# Patient Record
Sex: Male | Born: 1940 | Race: White | Hispanic: No | Smoking: Former smoker
Health system: Southern US, Community
[De-identification: ages and names within clinical notes are randomized; demographics above are authoritative.]

## PROBLEM LIST (undated history)

## (undated) DIAGNOSIS — G629 Polyneuropathy, unspecified: Secondary | ICD-10-CM

## (undated) DIAGNOSIS — I272 Pulmonary hypertension, unspecified: Secondary | ICD-10-CM

## (undated) DIAGNOSIS — G9340 Encephalopathy, unspecified: Secondary | ICD-10-CM

## (undated) DIAGNOSIS — D638 Anemia in other chronic diseases classified elsewhere: Secondary | ICD-10-CM

## (undated) DIAGNOSIS — I739 Peripheral vascular disease, unspecified: Secondary | ICD-10-CM

## (undated) DIAGNOSIS — E1159 Type 2 diabetes mellitus with other circulatory complications: Secondary | ICD-10-CM

## (undated) DIAGNOSIS — I10 Essential (primary) hypertension: Secondary | ICD-10-CM

## (undated) DIAGNOSIS — S065XAA Traumatic subdural hemorrhage with loss of consciousness status unknown, initial encounter: Secondary | ICD-10-CM

## (undated) DIAGNOSIS — I5022 Chronic systolic (congestive) heart failure: Secondary | ICD-10-CM

## (undated) DIAGNOSIS — I2510 Atherosclerotic heart disease of native coronary artery without angina pectoris: Secondary | ICD-10-CM

## (undated) DIAGNOSIS — Z87891 Personal history of nicotine dependence: Secondary | ICD-10-CM

## (undated) DIAGNOSIS — L089 Local infection of the skin and subcutaneous tissue, unspecified: Secondary | ICD-10-CM

## (undated) DIAGNOSIS — N1831 Chronic kidney disease, stage 3a: Secondary | ICD-10-CM

## (undated) DIAGNOSIS — E11628 Type 2 diabetes mellitus with other skin complications: Secondary | ICD-10-CM

## (undated) DIAGNOSIS — E119 Type 2 diabetes mellitus without complications: Secondary | ICD-10-CM

## (undated) HISTORY — DX: Pulmonary hypertension, unspecified: I27.20

## (undated) HISTORY — DX: Traumatic subdural hemorrhage with loss of consciousness status unknown, initial encounter: S06.5XAA

## (undated) HISTORY — DX: Chronic systolic (congestive) heart failure: I50.22

## (undated) HISTORY — PX: CORONARY ANGIOPLASTY WITH STENT PLACEMENT: SHX49

---

## 1997-11-27 ENCOUNTER — Emergency Department (HOSPITAL_COMMUNITY): Admission: EM | Admit: 1997-11-27 | Discharge: 1997-11-27 | Payer: Self-pay | Admitting: *Deleted

## 2009-02-27 ENCOUNTER — Emergency Department (HOSPITAL_COMMUNITY): Admission: EM | Admit: 2009-02-27 | Discharge: 2009-02-27 | Payer: Self-pay | Admitting: Emergency Medicine
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
## 2012-06-13 ENCOUNTER — Encounter (HOSPITAL_COMMUNITY): Payer: Self-pay | Admitting: Emergency Medicine

## 2012-06-13 ENCOUNTER — Emergency Department (HOSPITAL_COMMUNITY)
Admission: EM | Admit: 2012-06-13 | Discharge: 2012-06-13 | Disposition: A | Discharge status: Home / Self Care | Payer: PRIVATE HEALTH INSURANCE | Attending: Emergency Medicine | Admitting: Emergency Medicine

## 2012-06-13 DIAGNOSIS — I10 Essential (primary) hypertension: Secondary | ICD-10-CM | POA: Insufficient documentation

## 2012-06-13 DIAGNOSIS — R7309 Other abnormal glucose: Secondary | ICD-10-CM | POA: Insufficient documentation

## 2012-06-13 DIAGNOSIS — Z79899 Other long term (current) drug therapy: Secondary | ICD-10-CM | POA: Insufficient documentation

## 2012-06-13 DIAGNOSIS — Z9861 Coronary angioplasty status: Secondary | ICD-10-CM | POA: Insufficient documentation

## 2012-06-13 DIAGNOSIS — M545 Low back pain, unspecified: Secondary | ICD-10-CM | POA: Insufficient documentation

## 2012-06-13 DIAGNOSIS — I2510 Atherosclerotic heart disease of native coronary artery without angina pectoris: Secondary | ICD-10-CM | POA: Insufficient documentation

## 2012-06-13 HISTORY — DX: Atherosclerotic heart disease of native coronary artery without angina pectoris: I25.10

## 2012-06-13 LAB — URINALYSIS, ROUTINE W REFLEX MICROSCOPIC
Hgb urine dipstick: NEGATIVE
Leukocytes, UA: NEGATIVE
Nitrite: NEGATIVE
Specific Gravity, Urine: <1.005 — ABNORMAL LOW (ref 1.005–1.030)
Urobilinogen, UA: 0.2 mg/dL (ref 0.0–1.0)

## 2012-06-13 LAB — BASIC METABOLIC PANEL
GFR calc Af Amer: >90 mL/min (ref >90)
GFR calc non Af Amer: 83 mL/min — ABNORMAL LOW (ref >90)
Glucose, Bld: 293 mg/dL — ABNORMAL HIGH (ref 70–99)
Potassium: 4.7 mEq/L (ref 3.5–5.1)
Sodium: 129 mEq/L — ABNORMAL LOW (ref 135–145)

## 2012-06-13 LAB — URINE MICROSCOPIC-ADD ON

## 2012-06-13 MED ORDER — NAPROXEN 500 MG PO TABS: 500.0000 mg | ORAL_TABLET | Freq: Two times a day (BID) | ORAL | Status: DC

## 2012-06-13 MED ORDER — CYCLOBENZAPRINE HCL 10 MG PO TABS
10.0000 mg | ORAL_TABLET | Freq: Once | ORAL | Status: AC
  Administered 2012-06-13: 10 mg via ORAL
  Filled 2012-06-13: qty 1

## 2012-06-13 MED ORDER — CYCLOBENZAPRINE HCL 10 MG PO TABS: 10.0000 mg | ORAL_TABLET | Freq: Two times a day (BID) | ORAL | Status: DC | PRN

## 2012-06-13 MED ORDER — KETOROLAC TROMETHAMINE 30 MG/ML IJ SOLN
30.0000 mg | Freq: Once | INTRAMUSCULAR | Status: AC
  Administered 2012-06-13: 30 mg via INTRAVENOUS
  Filled 2012-06-13: qty 1

## 2012-06-13 MED ORDER — GLUCOSE BLOOD VI STRP: ORAL_STRIP | Status: DC

## 2012-06-13 MED ORDER — METFORMIN HCL 500 MG PO TABS: 500.0000 mg | ORAL_TABLET | Freq: Two times a day (BID) | ORAL | Status: DC

## 2012-06-13 MED ORDER — LISINOPRIL 10 MG PO TABS: 10.0000 mg | ORAL_TABLET | Freq: Every day | ORAL | Status: DC

## 2012-06-13 MED ORDER — LISINOPRIL 10 MG PO TABS
10.0000 mg | ORAL_TABLET | ORAL | Status: AC
  Administered 2012-06-13: 10 mg via ORAL
  Filled 2012-06-13: qty 1

## 2012-06-13 NOTE — ED Notes (Signed)
IV removed by Nurse.

## 2012-06-13 NOTE — ED Notes (Signed)
Patient presents to ER via [facility] with c/o right sided lower back pain x 2 weeks.

## 2012-06-13 NOTE — ED Provider Notes (Signed)
History     CSN: [id number]  Arrival date & time 06/13/12  0159   First MD Initiated Contact with Patient 06/13/12 [phone]      Chief Complaint  Patient presents with  . Back Pain    (Consider location/radiation/quality/duration/timing/severity/associated sxs/prior treatment) HPI Comments: 72 year old male with a history of coronary disease status post stenting several years ago who presents with a complaint of right lower back pain which has been present for approximately 2 weeks. He states that it started one day while he was using his tractor to clear [place] when he looked over her shoulder and bent to the right. He had acute onset of sharp pain, worse with movement, improved when he rests perfectly still. Over the last 24 hours this pain has become unbearable, not associated with numbness weakness difficulty urinating fevers chills history of cancer or midline back pain. He denies difficulty urinating, hematuria, nausea or vomiting. He called paramedics for transport this evening because of severe pain.  This pain is made worse by any movements from a laying to a sitting position, it is made better when he calls perfectly still  Patient is a 72 y.o. male presenting with back pain. The history is provided by the patient.  Back Pain   Past Medical History  Diagnosis Date  . Coronary artery disease     Past Surgical History  Procedure Laterality Date  . Coronary angioplasty with stent placement      No family history on file.  History  Substance Use Topics  . Smoking status: Never Smoker   . Smokeless tobacco: Not on file  . Alcohol Use: No      Review of Systems  Musculoskeletal: Positive for back pain.  All other systems reviewed and are negative.    Allergies  Review of patient's allergies indicates no known allergies.  Home Medications   Current Outpatient Rx  Name  Route  Sig  Dispense  Refill  . cyclobenzaprine (FLEXERIL) 10 MG tablet   Oral   Take 1  tablet (10 mg total) by mouth 2 (two) times daily as needed for muscle spasms.   20 tablet   0   . glucose blood test strip      Use as instructed   100 each   12   . lisinopril (PRINIVIL,ZESTRIL) 10 MG tablet   Oral   Take 1 tablet (10 mg total) by mouth daily.   30 tablet   1   . metFORMIN (GLUCOPHAGE) 500 MG tablet   Oral   Take 1 tablet (500 mg total) by mouth 2 (two) times daily with a meal.   60 tablet   1   . naproxen (NAPROSYN) 500 MG tablet   Oral   Take 1 tablet (500 mg total) by mouth 2 (two) times daily with a meal.   30 tablet   0     BP 190/87  Pulse 38  Temp(Src) 98.3 F (36.8 C) (Oral)  Resp 17  SpO2 86%  Physical Exam  Nursing note and vitals reviewed. Constitutional: He appears well-developed and well-nourished. No distress.  HENT:  Head: Normocephalic and atraumatic.  Mouth/Throat: Oropharynx is clear and moist. No oropharyngeal exudate.  Eyes: Conjunctivae and EOM are normal. Pupils are equal, round, and reactive to light. Right eye exhibits no discharge. Left eye exhibits no discharge. No scleral icterus.  Neck: Normal range of motion. Neck supple. No JVD present. No thyromegaly present.  Cardiovascular: Normal rate, regular rhythm, normal heart sounds  and intact distal pulses.  Exam reveals no gallop and no friction rub.   No murmur heard. Significant hypertension at 215/95  Pulmonary/Chest: Effort normal and breath sounds normal. No respiratory distress. He has no wheezes. He has no rales.  Abdominal: Soft. Bowel sounds are normal. He exhibits no distension and no mass. There is no tenderness.  No pulsating mass, no hepatosplenomegaly  Musculoskeletal: Normal range of motion. He exhibits tenderness ( Tenderness to palpation in the right lower back muscles, no spinal tenderness). He exhibits no edema.  Lymphadenopathy:    He has no cervical adenopathy.  Neurological: He is alert. Coordination normal.  Normal sensation to light touch and  pinprick of the bilateral lower extremities, normal strength to the bilateral lower extremities at all major muscle groups. He has significant tenderness in his right lower back when he lifts his right leg.  Skin: Skin is warm and dry. No rash noted. No erythema.  Psychiatric: He has a normal mood and affect. His behavior is normal.    ED Course  Procedures (including critical care time)  Labs Reviewed  BASIC METABOLIC PANEL - Abnormal; Notable for the following:    Sodium 129 (*)    Chloride 92 (*)    Glucose, Bld 293 (*)    GFR calc non Af Amer 83 (*)    All other components within normal limits  URINALYSIS, ROUTINE W REFLEX MICROSCOPIC - Abnormal; Notable for the following:    Color, Urine STRAW (*)    Specific Gravity, Urine <1.005 (*)    Glucose, UA >1000 (*)    All other components within normal limits  GLUCOSE, CAPILLARY - Abnormal; Notable for the following:    Glucose-Capillary 267 (*)    All other components within normal limits  GLUCOSE, CAPILLARY - Abnormal; Notable for the following:    Glucose-Capillary 268 (*)    All other components within normal limits  URINE MICROSCOPIC-ADD ON   No results found.   1. Back pain   2. Hypertension   3. Hyperglycemia       MDM  Sharp shooting pain, reproducible with movements, likely muscle spasms and a lumbar strain type pattern. No focal neurologic deficits, despite the patient's age of 52 I doubt that this is a neurologic condition at this time does not warrant any imaging. We'll start with medications including medications for his blood pressure, check renal function, urinalysis, pain medicines.  ED ECG REPORT  I personally interpreted this EKG   Date: 06/13/2012   Rate: 89  Rhythm: normal sinus rhythm  QRS Axis: normal  Intervals: normal  ST/T Wave abnormalities: normal  Conduction Disutrbances:none  Narrative Interpretation:   Old EKG Reviewed: none available  #1. Labs reviewed - he has slight low Na, normal  renal function and elevated glucose - he has not had anything to eat or drink since 6 PM yesterday, suspect DM.  #2.  UA without dehydration or infection  #3.  Hypertension - has improved spontaneously after pain meds given - now 166/90.  Pt feels better at this time.   Pt well appearing, 12 hour glucose was 268, pt is stable and feels better after the medications for pain - have given referal information for local PMD f/u  Lisinopril, metformin, Naprosyn and Flexeril for Rx.  Vida Roller, MD 06/13/12 [phone]

## 2012-08-29 ENCOUNTER — Encounter (HOSPITAL_COMMUNITY): Payer: Self-pay | Admitting: Dietician

## 2012-08-29 NOTE — Progress Notes (Signed)
[facility] Diabetes Class Completion  Date:August 29, 2012  Time: 1000  Pt attended [facility]'s Diabetes Group Education Class on August 29, 2012.   Patient was educated on the following topics: survival skills (signs and symptoms of hyperglycemia and hypoglycemia, treatment for hypoglycemia, ideal levels for fasting and postprandial blood sugars, goal Hgb A1c level, foot care basics), recommendations for physical activity, carbohydrate metabolism in relation to diabetes, and meal planning (sources of carbohydrate, carbohydrate counting, meal planning strategies, food label reading, and portion control).   Wayne [name] A. Kayan, RD, LDN  

## 2012-09-12 ENCOUNTER — Telehealth (HOSPITAL_COMMUNITY): Payer: Self-pay | Admitting: Dietician

## 2012-09-12 NOTE — Telephone Encounter (Signed)
t was a no-show to diabetes class today (pt attended class previously on 08/29/12, but requested attending again to better understand information). Pt left messages on voicemail today at 1019 and 1023 requesting when the next group class would be held. Called back at 1549 and left message on pt voicemail. Informed next class was 09/26/12 at 1000.

## 2012-09-26 ENCOUNTER — Encounter (HOSPITAL_COMMUNITY): Payer: Self-pay | Admitting: Dietician

## 2012-09-26 NOTE — Progress Notes (Signed)
[facility] Diabetes Class Completion  Date:September 26, 2012  Time: 1000  Pt attended [facility]'s Diabetes Group Education Class on September 26, 2012.   Patient was educated on the following topics: survival skills (signs and symptoms of hyperglycemia and hypoglycemia, treatment for hypoglycemia, ideal levels for fasting and postprandial blood sugars, goal Hgb A1c level, foot care basics), recommendations for physical activity, carbohydrate metabolism in relation to diabetes, and meal planning (sources of carbohydrate, carbohydrate counting, meal planning strategies, food label reading, and portion control).   Melody Haver, RD, LDN

## 2012-10-05 ENCOUNTER — Encounter (HOSPITAL_COMMUNITY): Payer: Self-pay | Admitting: Dietician

## 2012-10-05 NOTE — Progress Notes (Signed)
[facility] Diabetes Class Completion  Date:October 05, 2012  Time: 1730  Pt attended [facility]'s Diabetes Group Education Class on October 05, 2012.   Patient was educated on the following topics:   -Survival skills (signs and symptoms of hyperglycemia and hypoglycemia, treatment for hypoglycemia, ideal levels for fasting and postprandial blood sugars, goal Hgb A1c level, foot care basics)  -Recommendations for physical activity   -Carbohydrate metabolism in relation to diabetes   -Meal planning (sources of carbohydrate, carbohydrate counting, meal planning strategies, food label reading, and portion control).  Handouts provided:  -"Diabetes and You: Taking Charge of Your Health"  -"Carbohydrate Counting and Meal Planning"  -"Blood Sugar Diary"  -"Your Guide to Better Office Visits"   Marlinda Miranda A. Kayan, RD, LDN   

## 2021-11-11 ENCOUNTER — Inpatient Hospital Stay (HOSPITAL_BASED_OUTPATIENT_CLINIC_OR_DEPARTMENT_OTHER)
Admission: EM | Admit: 2021-11-11 | Discharge: 2021-11-16 | DRG: 637 | Disposition: A | Discharge status: Home Health | Payer: Medicare Other | Attending: Family Medicine | Admitting: Family Medicine

## 2021-11-11 ENCOUNTER — Other Ambulatory Visit: Payer: Self-pay

## 2021-11-11 ENCOUNTER — Encounter (HOSPITAL_COMMUNITY): Payer: Self-pay

## 2021-11-11 ENCOUNTER — Emergency Department (HOSPITAL_BASED_OUTPATIENT_CLINIC_OR_DEPARTMENT_OTHER): Payer: Medicare Other | Admitting: Radiology

## 2021-11-11 ENCOUNTER — Emergency Department (HOSPITAL_BASED_OUTPATIENT_CLINIC_OR_DEPARTMENT_OTHER): Payer: Medicare Other

## 2021-11-11 ENCOUNTER — Encounter (HOSPITAL_BASED_OUTPATIENT_CLINIC_OR_DEPARTMENT_OTHER): Payer: Self-pay

## 2021-11-11 DIAGNOSIS — L03115 Cellulitis of right lower limb: Secondary | ICD-10-CM | POA: Diagnosis present

## 2021-11-11 DIAGNOSIS — R230 Cyanosis: Secondary | ICD-10-CM

## 2021-11-11 DIAGNOSIS — Z87891 Personal history of nicotine dependence: Secondary | ICD-10-CM

## 2021-11-11 DIAGNOSIS — L97519 Non-pressure chronic ulcer of other part of right foot with unspecified severity: Secondary | ICD-10-CM | POA: Diagnosis present

## 2021-11-11 DIAGNOSIS — E785 Hyperlipidemia, unspecified: Secondary | ICD-10-CM | POA: Diagnosis present

## 2021-11-11 DIAGNOSIS — E11621 Type 2 diabetes mellitus with foot ulcer: Secondary | ICD-10-CM | POA: Diagnosis present

## 2021-11-11 DIAGNOSIS — I10 Essential (primary) hypertension: Secondary | ICD-10-CM | POA: Diagnosis present

## 2021-11-11 DIAGNOSIS — E119 Type 2 diabetes mellitus without complications: Secondary | ICD-10-CM | POA: Diagnosis not present

## 2021-11-11 DIAGNOSIS — Z955 Presence of coronary angioplasty implant and graft: Secondary | ICD-10-CM

## 2021-11-11 DIAGNOSIS — G9340 Encephalopathy, unspecified: Secondary | ICD-10-CM | POA: Diagnosis present

## 2021-11-11 DIAGNOSIS — E538 Deficiency of other specified B group vitamins: Secondary | ICD-10-CM | POA: Diagnosis present

## 2021-11-11 DIAGNOSIS — X58XXXA Exposure to other specified factors, initial encounter: Secondary | ICD-10-CM | POA: Diagnosis present

## 2021-11-11 DIAGNOSIS — E1151 Type 2 diabetes mellitus with diabetic peripheral angiopathy without gangrene: Secondary | ICD-10-CM | POA: Diagnosis present

## 2021-11-11 DIAGNOSIS — Z7984 Long term (current) use of oral hypoglycemic drugs: Secondary | ICD-10-CM

## 2021-11-11 DIAGNOSIS — L03119 Cellulitis of unspecified part of limb: Secondary | ICD-10-CM | POA: Diagnosis not present

## 2021-11-11 DIAGNOSIS — E1122 Type 2 diabetes mellitus with diabetic chronic kidney disease: Secondary | ICD-10-CM | POA: Diagnosis present

## 2021-11-11 DIAGNOSIS — Z79899 Other long term (current) drug therapy: Secondary | ICD-10-CM

## 2021-11-11 DIAGNOSIS — R54 Age-related physical debility: Secondary | ICD-10-CM | POA: Diagnosis present

## 2021-11-11 DIAGNOSIS — I70201 Unspecified atherosclerosis of native arteries of extremities, right leg: Secondary | ICD-10-CM | POA: Diagnosis present

## 2021-11-11 DIAGNOSIS — I2510 Atherosclerotic heart disease of native coronary artery without angina pectoris: Secondary | ICD-10-CM | POA: Diagnosis present

## 2021-11-11 DIAGNOSIS — E11628 Type 2 diabetes mellitus with other skin complications: Principal | ICD-10-CM | POA: Diagnosis present

## 2021-11-11 DIAGNOSIS — E1165 Type 2 diabetes mellitus with hyperglycemia: Secondary | ICD-10-CM | POA: Diagnosis present

## 2021-11-11 DIAGNOSIS — N179 Acute kidney failure, unspecified: Secondary | ICD-10-CM | POA: Diagnosis present

## 2021-11-11 DIAGNOSIS — S92514A Nondisplaced fracture of proximal phalanx of right lesser toe(s), initial encounter for closed fracture: Secondary | ICD-10-CM | POA: Diagnosis present

## 2021-11-11 DIAGNOSIS — D631 Anemia in chronic kidney disease: Secondary | ICD-10-CM | POA: Diagnosis present

## 2021-11-11 DIAGNOSIS — Z833 Family history of diabetes mellitus: Secondary | ICD-10-CM

## 2021-11-11 DIAGNOSIS — E1140 Type 2 diabetes mellitus with diabetic neuropathy, unspecified: Secondary | ICD-10-CM | POA: Diagnosis present

## 2021-11-11 DIAGNOSIS — Z9861 Coronary angioplasty status: Secondary | ICD-10-CM | POA: Diagnosis not present

## 2021-11-11 DIAGNOSIS — G9341 Metabolic encephalopathy: Secondary | ICD-10-CM | POA: Diagnosis present

## 2021-11-11 DIAGNOSIS — E872 Acidosis, unspecified: Secondary | ICD-10-CM | POA: Diagnosis present

## 2021-11-11 DIAGNOSIS — L0390 Cellulitis, unspecified: Secondary | ICD-10-CM | POA: Diagnosis present

## 2021-11-11 DIAGNOSIS — I70235 Atherosclerosis of native arteries of right leg with ulceration of other part of foot: Secondary | ICD-10-CM | POA: Diagnosis not present

## 2021-11-11 DIAGNOSIS — R52 Pain, unspecified: Secondary | ICD-10-CM | POA: Diagnosis not present

## 2021-11-11 DIAGNOSIS — M79674 Pain in right toe(s): Secondary | ICD-10-CM | POA: Diagnosis not present

## 2021-11-11 HISTORY — DX: Type 2 diabetes mellitus without complications: E11.9

## 2021-11-11 LAB — CBC WITH DIFFERENTIAL/PLATELET
Abs Immature Granulocytes: 0.03 10*3/uL (ref 0.00–0.07)
Basophils Absolute: 0 10*3/uL (ref 0.0–0.1)
Basophils Relative: 0 %
Eosinophils Absolute: 0.1 10*3/uL (ref 0.0–0.5)
Eosinophils Relative: 1 %
HCT: 36.5 % — ABNORMAL LOW (ref 39.0–52.0)
Hemoglobin: 12.5 g/dL — ABNORMAL LOW (ref 13.0–17.0)
Immature Granulocytes: 0 %
Lymphocytes Relative: 14 %
Lymphs Abs: 1.2 10*3/uL (ref 0.7–4.0)
MCH: 30.3 pg (ref 26.0–34.0)
MCHC: 34.2 g/dL (ref 30.0–36.0)
MCV: 88.4 fL (ref 80.0–100.0)
Monocytes Absolute: 0.6 10*3/uL (ref 0.1–1.0)
Monocytes Relative: 7 %
Neutro Abs: 6.8 10*3/uL (ref 1.7–7.7)
Neutrophils Relative %: 78 %
Platelets: 233 10*3/uL (ref 150–400)
RBC: 4.13 MIL/uL — ABNORMAL LOW (ref 4.22–5.81)
RDW: 12 % (ref 11.5–15.5)
WBC: 8.8 10*3/uL (ref 4.0–10.5)
nRBC: 0 % (ref 0.0–0.2)

## 2021-11-11 LAB — COMPREHENSIVE METABOLIC PANEL
ALT: 19 U/L (ref 0–44)
AST: 14 U/L — ABNORMAL LOW (ref 15–41)
Albumin: 4 g/dL (ref 3.5–5.0)
Alkaline Phosphatase: 194 U/L — ABNORMAL HIGH (ref 38–126)
Anion gap: 12 (ref 5–15)
BUN: 43 mg/dL — ABNORMAL HIGH (ref 8–23)
CO2: 25 mmol/L (ref 22–32)
Calcium: 9.5 mg/dL (ref 8.9–10.3)
Chloride: 98 mmol/L (ref 98–111)
Creatinine, Ser: 1.48 mg/dL — ABNORMAL HIGH (ref 0.61–1.24)
GFR, Estimated: 47 mL/min — ABNORMAL LOW (ref >60)
Glucose, Bld: 311 mg/dL — ABNORMAL HIGH (ref 70–99)
Potassium: 4.3 mmol/L (ref 3.5–5.1)
Sodium: 135 mmol/L (ref 135–145)
Total Bilirubin: 1.2 mg/dL (ref 0.3–1.2)
Total Protein: 8 g/dL (ref 6.5–8.1)

## 2021-11-11 LAB — URINALYSIS, ROUTINE W REFLEX MICROSCOPIC
Bilirubin Urine: NEGATIVE
Glucose, UA: >1000 mg/dL — AB
Hgb urine dipstick: NEGATIVE
Ketones, ur: NEGATIVE mg/dL
Leukocytes,Ua: NEGATIVE
Nitrite: NEGATIVE
Specific Gravity, Urine: 1.018 (ref 1.005–1.030)
pH: 6 (ref 5.0–8.0)

## 2021-11-11 LAB — CBC
HCT: 30.4 % — ABNORMAL LOW (ref 39.0–52.0)
Hemoglobin: 10.6 g/dL — ABNORMAL LOW (ref 13.0–17.0)
MCH: 30.2 pg (ref 26.0–34.0)
MCHC: 34.9 g/dL (ref 30.0–36.0)
MCV: 86.6 fL (ref 80.0–100.0)
Platelets: 201 10*3/uL (ref 150–400)
RBC: 3.51 MIL/uL — ABNORMAL LOW (ref 4.22–5.81)
RDW: 11.9 % (ref 11.5–15.5)
WBC: 8.4 10*3/uL (ref 4.0–10.5)
nRBC: 0 % (ref 0.0–0.2)

## 2021-11-11 LAB — LACTIC ACID, PLASMA
Lactic Acid, Venous: 1.2 mmol/L (ref 0.5–1.9)
Lactic Acid, Venous: 3 mmol/L (ref 0.5–1.9)

## 2021-11-11 LAB — CREATININE, SERUM
Creatinine, Ser: 1.36 mg/dL — ABNORMAL HIGH (ref 0.61–1.24)
GFR, Estimated: 52 mL/min — ABNORMAL LOW (ref >60)

## 2021-11-11 LAB — PROTIME-INR
INR: 1.1 (ref 0.8–1.2)
Prothrombin Time: 14.3 seconds (ref 11.4–15.2)

## 2021-11-11 LAB — CBG MONITORING, ED: Glucose-Capillary: 258 mg/dL — ABNORMAL HIGH (ref 70–99)

## 2021-11-11 LAB — C-REACTIVE PROTEIN: CRP: 13.7 mg/dL — ABNORMAL HIGH (ref <1.0)

## 2021-11-11 LAB — SEDIMENTATION RATE: Sed Rate: 117 mm/hr — ABNORMAL HIGH (ref 0–16)

## 2021-11-11 LAB — GLUCOSE, CAPILLARY: Glucose-Capillary: 240 mg/dL — ABNORMAL HIGH (ref 70–99)

## 2021-11-11 MED ORDER — SODIUM CHLORIDE 0.9 % IV BOLUS
1000.0000 mL | Freq: Once | INTRAVENOUS | Status: AC
  Administered 2021-11-11: 1000 mL via INTRAVENOUS

## 2021-11-11 MED ORDER — SODIUM CHLORIDE 0.9 % IV BOLUS
1000.0000 mL | Freq: Once | INTRAVENOUS | Status: AC
Start: 2021-11-11 — End: 2021-11-11
  Administered 2021-11-11: 1000 mL via INTRAVENOUS

## 2021-11-11 MED ORDER — LACTATED RINGERS IV SOLN: INTRAVENOUS | Status: DC

## 2021-11-11 MED ORDER — INSULIN ASPART 100 UNIT/ML IJ SOLN
0.0000 [IU] | Freq: Three times a day (TID) | INTRAMUSCULAR | Status: DC
  Administered 2021-11-12 (×2): 2 [IU] via SUBCUTANEOUS
  Administered 2021-11-12: 3 [IU] via SUBCUTANEOUS
  Administered 2021-11-13: 5 [IU] via SUBCUTANEOUS
  Administered 2021-11-14: 2 [IU] via SUBCUTANEOUS
  Administered 2021-11-14: 5 [IU] via SUBCUTANEOUS
  Administered 2021-11-14: 1 [IU] via SUBCUTANEOUS
  Administered 2021-11-15 (×2): 2 [IU] via SUBCUTANEOUS

## 2021-11-11 MED ORDER — SODIUM CHLORIDE 0.9 % IV SOLN
2.0000 g | Freq: Once | INTRAVENOUS | Status: AC
  Administered 2021-11-11: 2 g via INTRAVENOUS
  Filled 2021-11-11: qty 12.5

## 2021-11-11 MED ORDER — METRONIDAZOLE 500 MG/100ML IV SOLN
500.0000 mg | Freq: Once | INTRAVENOUS | Status: AC
  Administered 2021-11-11: 500 mg via INTRAVENOUS
  Filled 2021-11-11: qty 100

## 2021-11-11 MED ORDER — VANCOMYCIN HCL 1250 MG/250ML IV SOLN
1250.0000 mg | INTRAVENOUS | Status: DC
  Administered 2021-11-12: 1250 mg via INTRAVENOUS
  Filled 2021-11-11 (×3): qty 250

## 2021-11-11 MED ORDER — ACETAMINOPHEN 650 MG RE SUPP: 650.0000 mg | Freq: Four times a day (QID) | RECTAL | Status: DC | PRN

## 2021-11-11 MED ORDER — HEPARIN SODIUM (PORCINE) 5000 UNIT/ML IJ SOLN
5000.0000 [IU] | Freq: Three times a day (TID) | INTRAMUSCULAR | Status: DC
  Administered 2021-11-11 – 2021-11-16 (×14): 5000 [IU] via SUBCUTANEOUS
  Filled 2021-11-11 (×12): qty 1

## 2021-11-11 MED ORDER — SODIUM CHLORIDE 0.9 % IV SOLN
2.0000 g | Freq: Two times a day (BID) | INTRAVENOUS | Status: DC
  Administered 2021-11-11 – 2021-11-15 (×8): 2 g via INTRAVENOUS
  Filled 2021-11-11 (×8): qty 12.5

## 2021-11-11 MED ORDER — SODIUM CHLORIDE 0.9 % IV SOLN: INTRAVENOUS | Status: DC

## 2021-11-11 MED ORDER — ACETAMINOPHEN 325 MG PO TABS
650.0000 mg | ORAL_TABLET | Freq: Four times a day (QID) | ORAL | Status: DC | PRN
  Administered 2021-11-15 – 2021-11-16 (×2): 650 mg via ORAL
  Filled 2021-11-11 (×2): qty 2

## 2021-11-11 MED ORDER — DIPHENHYDRAMINE HCL 25 MG PO CAPS
50.0000 mg | ORAL_CAPSULE | Freq: Once | ORAL | Status: AC
  Administered 2021-11-11: 50 mg via ORAL
  Filled 2021-11-11: qty 2

## 2021-11-11 MED ORDER — VANCOMYCIN HCL IN DEXTROSE 1-5 GM/200ML-% IV SOLN
1000.0000 mg | Freq: Once | INTRAVENOUS | Status: AC
  Administered 2021-11-11: 1000 mg via INTRAVENOUS
  Filled 2021-11-11: qty 200

## 2021-11-11 NOTE — Sepsis Progress Note (Signed)
eLink is following this Code Sepsis. °

## 2021-11-11 NOTE — ED Notes (Signed)
Called to update family, VM answered did not state given name. No message left

## 2021-11-11 NOTE — Progress Notes (Addendum)
Plan of Care Note for accepted transfer   Patient: Wayne [name] MRN: [id number]   DOA: 11/11/2021  Facility requesting transfer: [facility] Requesting Provider: Dr. Rhunette Croft Reason for transfer: Right foot cellulitis Facility course: 81 yo M with history of DM2. Presenting with foot swelling and confusion. EDP noted right foot is swollen and second digit is dusky. XR foot pending. Vascular consult pending. He was started on vanc, flagyl, cefepime. Vitals are ok. Lab work notable for elevated glucose and possible AKI.   Plan of care: The patient is accepted for admission to Med-surg  unit, at [facility]. While holding at [facility], medical decision making responsibilities remain with the EDP. Upon arrival to [facility], [facility] will assume care. Thank you.   Author: Teddy Spike, DO 11/11/2021  Check [email or web address] for on-call coverage.  Nursing staff, Please call [facility] Admits & Consults System-Wide number on Amion as soon as patient's arrival, so appropriate admitting provider can evaluate the pt.

## 2021-11-11 NOTE — Progress Notes (Signed)
Pharmacy Antibiotic Note  Wayne [name] is a 81 y.o. male admitted on 11/11/2021 with sepsis and cellulitis.  Pharmacy has been consulted for vancomycin and cefepime dosing. Pt is afebrile and WBC is WNL. SCr is elevated at 1.48 and lactic acid is <2.   Plan: Cefepime 2gm IV Q12H Vanc 1250mg  IV Q24H F/u renal fxn, C&S, clinical status and peak/trough at SS    Temp (24hrs), Avg:98.4 F (36.9 C), Min:98.4 F (36.9 C), Max:98.4 F (36.9 C)  Recent Labs  Lab 11/11/21 1025  WBC 8.8  CREATININE 1.48*  LATICACIDVEN 1.2    Estimated Creatinine Clearance: 43.5 mL/min (A) (by C-G formula based on SCr of 1.48 mg/dL (H)).    No Known Allergies  Antimicrobials this admission: Vanc 8/16>> Cefepime 8/16>> Flagyl x 1 8/16  Dose adjustments this admission: N/A  Microbiology results: pending  Thank you for allowing pharmacy to be a part of this patient's care.  Elysa Womac, 11/13/21 11/11/2021 12:03 PM

## 2021-11-11 NOTE — ED Triage Notes (Signed)
His wife states pt. Has had a sore on, and some swelling of, right foot x ~5 days. She also has noted pt. To be more confused than usual. Seen at Urgent Care, who recommended he some here. Sepsis protocol initiated at triage.

## 2021-11-11 NOTE — ED Notes (Signed)
Posey bed alarm in place. Reinforced with patient the he should call before getting up. Call bell place at bedside, toileting offered.

## 2021-11-11 NOTE — ED Notes (Signed)
Repositioned to comfort, offered toileting, reinforced need to use call bell for assistance prior to getting out of bed

## 2021-11-11 NOTE — ED Notes (Signed)
Please call Carolyn [phone] when rm. # obtained wnd time of transport.

## 2021-11-11 NOTE — H&P (Signed)
History and Physical    Wayne [name] QVZ:[id number] DOB: 03-Mar-1941 DOA: 11/11/2021  PCP: Patient, No Pcp Per  Patient coming from: Home.  History obtained from patient's daughter.  Patient has memory issues.  Chief Complaint: Right foot swelling.  HPI: Wayne [name] is a 81 y.o. male with history of diabetes mellitus type 2, CAD status post stenting and hypertension who has not been taking any medications was noticed to have increasing swelling on the right foot over the last 5 days and over the last 24 hours noted a blister form on the right second toe.  Patient was taken to the urgent care where the blister burst and the leg appeared slightly dusky in color was referred to the ER.  ED Course: In the ER x-rays do not show any bony involvement.  Since there was some dusky color discoloration of the right great toe concerning for possible arterial involvement ER physician discussed with Dr. Butch Penny vascular surgeon who requested getting ABI.  Patient was started on empiric antibiotics.  Subsequent labs show elevated lactic acid and was given fluid bolus.  Admitted for further work-up.  Review of Systems: As per HPI, rest all negative.   Past Medical History:  Diagnosis Date   Coronary artery disease    Diabetes mellitus without complication (HCC)     Past Surgical History:  Procedure Laterality Date   CORONARY ANGIOPLASTY WITH STENT PLACEMENT       reports that he has quit smoking. His smoking use included cigarettes. He does not have any smokeless tobacco history on file. He reports that he does not drink alcohol and does not use drugs.  No Known Allergies  Family History  Problem Relation Age of Onset   Diabetes type II Neg Hx     Prior to Admission medications   Medication Sig Start Date End Date Taking? Authorizing Provider  ibuprofen (ADVIL) 200 MG tablet Take 200 mg by mouth every 6 (six) hours as needed.   Yes [provider]  cyclobenzaprine  (FLEXERIL) 10 MG tablet Take 1 tablet (10 mg total) by mouth 2 (two) times daily as needed for muscle spasms. Patient not taking: Reported on 11/11/2021 06/13/12   Eber Hong, MD  glucose blood test strip Use as instructed 06/13/12   Eber Hong, MD  lisinopril (PRINIVIL,ZESTRIL) 10 MG tablet Take 1 tablet (10 mg total) by mouth daily. Patient not taking: Reported on 11/11/2021 06/13/12   Eber Hong, MD  metFORMIN (GLUCOPHAGE) 500 MG tablet Take 1 tablet (500 mg total) by mouth 2 (two) times daily with a meal. Patient not taking: Reported on 11/11/2021 06/13/12   Eber Hong, MD  naproxen (NAPROSYN) 500 MG tablet Take 1 tablet (500 mg total) by mouth 2 (two) times daily with a meal. Patient not taking: Reported on 11/11/2021 06/13/12   Eber Hong, MD    Physical Exam: Constitutional: Moderately built and nourished. Vitals:   11/11/21 1452 11/11/21 1800 11/11/21 1839 11/11/21 1958  BP:  121/68  (!) 193/77  Pulse:  75  78  Resp:  (!) 21  19  Temp:   98.2 F (36.8 C) 98.8 F (37.1 C)  TempSrc:   Oral Oral  SpO2:  91%  99%  Weight: 85.7 kg   81.7 kg  Height:    6\' 1"  (1.854 m)   Eyes: Anicteric no pallor. ENMT: No discharge from the ears eyes nose and mouth. Neck: No mass felt.  No neck rigidity. Respiratory: No rhonchi or crepitations. Cardiovascular: S1-S2 heard.  Abdomen: Soft nontender bowel sound present. Musculoskeletal: Right foot is swollen but good dopplerable pulses.  Toes are discolored particular the second toe. Skin: Erythema and swelling of the right foot with dusky color of the second toe. Neurologic: Alert awake oriented to his name only.  Moving all extremities. Psychiatric: Appears confused.   Labs on Admission: I have personally reviewed following labs and imaging studies  CBC: Recent Labs  Lab 11/11/21 1025  WBC 8.8  NEUTROABS 6.8  HGB 12.5*  HCT 36.5*  MCV 88.4  PLT 233   Basic Metabolic Panel: Recent Labs  Lab 11/11/21 1025  NA 135  K 4.3   CL 98  CO2 25  GLUCOSE 311*  BUN 43*  CREATININE 1.48*  CALCIUM 9.5   GFR: Estimated Creatinine Clearance: 43.5 mL/min (A) (by C-G formula based on SCr of 1.48 mg/dL (H)). Liver Function Tests: Recent Labs  Lab 11/11/21 1025  AST 14*  ALT 19  ALKPHOS 194*  BILITOT 1.2  PROT 8.0  ALBUMIN 4.0   No results for input(s): "LIPASE", "AMYLASE" in the last 168 hours. No results for input(s): "AMMONIA" in the last 168 hours. Coagulation Profile: Recent Labs  Lab 11/11/21 1025  INR 1.1   Cardiac Enzymes: No results for input(s): "CKTOTAL", "CKMB", "CKMBINDEX", "TROPONINI" in the last 168 hours. BNP (last 3 results) No results for input(s): "PROBNP" in the last 8760 hours. HbA1C: No results for input(s): "HGBA1C" in the last 72 hours. CBG: Recent Labs  Lab 11/11/21 1836 11/11/21 2037  GLUCAP 258* 240*   Lipid Profile: No results for input(s): "CHOL", "HDL", "LDLCALC", "TRIG", "CHOLHDL", "LDLDIRECT" in the last 72 hours. Thyroid Function Tests: No results for input(s): "TSH", "T4TOTAL", "FREET4", "T3FREE", "THYROIDAB" in the last 72 hours. Anemia Panel: No results for input(s): "VITAMINB12", "FOLATE", "FERRITIN", "TIBC", "IRON", "RETICCTPCT" in the last 72 hours. Urine analysis:    Component Value Date/Time   COLORURINE YELLOW 11/11/2021 1239   APPEARANCEUR CLEAR 11/11/2021 1239   LABSPEC 1.018 11/11/2021 1239   PHURINE 6.0 11/11/2021 1239   GLUCOSEU >1,000 (A) 11/11/2021 1239   HGBUR NEGATIVE 11/11/2021 1239   BILIRUBINUR NEGATIVE 11/11/2021 1239   KETONESUR NEGATIVE 11/11/2021 1239   PROTEINUR TRACE (A) 11/11/2021 1239   UROBILINOGEN 0.2 06/13/2012 0325   NITRITE NEGATIVE 11/11/2021 1239   LEUKOCYTESUR NEGATIVE 11/11/2021 1239   Sepsis Labs: @LABRCNTIP (procalcitonin:4,lacticidven:4) )No results found for this or any previous visit (from the past 240 hour(s)).   Radiological Exams on Admission: DG Foot Complete Right  Result Date: 11/11/2021 CLINICAL DATA:   Right foot wound infection. EXAM: RIGHT FOOT COMPLETE - 3+ VIEW COMPARISON:  None Available. FINDINGS: There is no evidence of fracture or dislocation. No lytic destruction is noted. Vascular calcifications are noted. Mild posterior calcaneal spurring is noted. IMPRESSION: No acute abnormality seen. Electronically Signed   By: 11/13/2021 M.D.   On: 11/11/2021 12:35   DG Chest 2 View  Result Date: 11/11/2021 CLINICAL DATA:  Suspected sepsis EXAM: CHEST - 2 VIEW COMPARISON:  None FINDINGS: Heart is normal size. No confluent airspace opacities or effusions. Old healed left rib fracture. No acute bony abnormality. IMPRESSION: No active cardiopulmonary disease. Electronically Signed   By: 11/13/2021 M.D.   On: 11/11/2021 11:01      Assessment/Plan Principal Problem:   Cellulitis of right foot Active Problems:   Diabetes mellitus type 2 in nonobese [facility])   CAD S/P percutaneous coronary angioplasty   Cellulitis    Possible sepsis secondary to cellulitis  of the right foot for which patient is on antibiotics and fluids.  We will get MRI of the right foot and check ABI.  Patient's right foot second toe appears dusky.  Patient has good dopplerable pulses.  Dr. Butch Penny was consulted by ER physician requested getting ABI. Diabetes mellitus type 2 with hyperglycemia has not been taking any medications as per patient's daughter.  We will keep patient on glargine insulin 5 units along with sliding scale coverage check hemoglobin A1c. Hypertension at presentation presently blood pressures in normal range.  We will keep patient n.p.o. and IV hydralazine and closely follow blood pressure trends. History of CAD status post stenting has not been taking any medicines.  Keep patient on aspirin. Memory issues which has worsened recently.  CT head is pending.  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  Check B12 folate ammonia TSH and RPR. Anemia check anemia panel follow CBC. Acute renal failure -creatinine increased when  compared to the 1 in 2014.  No recent measurements available.  Hydrate and follow metabolic panel.  Since patient probably has sepsis from cellulitis will need close monitoring and inpatient status.   DVT prophylaxis: Heparin. Code Status: Full code. Family Communication: Patient's daughter. Disposition Plan: Home. Consults called: Vascular surgery was consulted by ER physician. Admission status: Inpatient.   Eduard Clos MD [facility] Pager [phone].  If 7PM-7AM, please contact night-coverage [email or web address] Password [facility]  11/11/2021, 9:43 PM

## 2021-11-11 NOTE — ED Notes (Signed)
Spoke with patient daughter Okey Regal. Updated on admission and placement.

## 2021-11-11 NOTE — ED Provider Notes (Signed)
[facility] EMERGENCY DEPT Provider Note   CSN: [id number] Arrival date & time: 11/11/21  0935     History  Chief Complaint  Patient presents with   Wound Infection    Wayne [name] is a 81 y.o. male.  HPI    81 year old male comes in with chief complaint of swelling of the foot, confusion and weakness.  Patient has history of CAD status post PCI, diabetes and hypertension.  Per patient's ex-wife, who is at the bedside patient has had swelling of the right foot for the last few days.  Over the last 2 days, the swelling has worsened, and patient has been more weak and confused, prompting her to bring the patient to the emergency room.  Confusion is described as not remembering simple tasks that he had just completed, repeating same questions etc.  There is no associated fevers or chills.  They have gone to urgent care, but advised to come to the emergency room.  Home Medications Prior to Admission medications   Medication Sig Start Date End Date Taking? Authorizing Provider  ibuprofen (ADVIL) 200 MG tablet Take 200 mg by mouth every 6 (six) hours as needed.   Yes [provider]  cyclobenzaprine (FLEXERIL) 10 MG tablet Take 1 tablet (10 mg total) by mouth 2 (two) times daily as needed for muscle spasms. Patient not taking: Reported on 11/11/2021 06/13/12   Eber Hong, MD  glucose blood test strip Use as instructed 06/13/12   Eber Hong, MD  lisinopril (PRINIVIL,ZESTRIL) 10 MG tablet Take 1 tablet (10 mg total) by mouth daily. Patient not taking: Reported on 11/11/2021 06/13/12   Eber Hong, MD  metFORMIN (GLUCOPHAGE) 500 MG tablet Take 1 tablet (500 mg total) by mouth 2 (two) times daily with a meal. Patient not taking: Reported on 11/11/2021 06/13/12   Eber Hong, MD  naproxen (NAPROSYN) 500 MG tablet Take 1 tablet (500 mg total) by mouth 2 (two) times daily with a meal. Patient not taking: Reported on 11/11/2021 06/13/12   Eber Hong, MD       Allergies    Patient has no known allergies.    Review of Systems   Review of Systems  Physical Exam Updated Vital Signs BP (!) 175/78   Pulse 81   Temp 97.8 F (36.6 C)   Resp 15   Ht 6\' 1"  (1.854 m) Comment: per urgent care note  Wt 85.7 kg   SpO2 94%   BMI 24.94 kg/m  Physical Exam Vitals and nursing note reviewed.  Constitutional:      Appearance: He is well-developed.  HENT:     Head: Atraumatic.  Eyes:     Extraocular Movements: Extraocular movements intact.     Pupils: Pupils are equal, round, and reactive to light.  Cardiovascular:     Rate and Rhythm: Normal rate.  Pulmonary:     Effort: Pulmonary effort is normal.  Musculoskeletal:        General: Swelling and tenderness present.     Cervical back: Neck supple.  Skin:    General: Skin is warm.     Findings: Erythema present.  Neurological:     Mental Status: He is alert and oriented to person, place, and time.          ED Results / Procedures / Treatments   Labs (all labs ordered are listed, but only abnormal results are displayed) Labs Reviewed  COMPREHENSIVE METABOLIC PANEL - Abnormal; Notable for the following components:  Result Value   Glucose, Bld 311 (*)    BUN 43 (*)    Creatinine, Ser 1.48 (*)    AST 14 (*)    Alkaline Phosphatase 194 (*)    GFR, Estimated 47 (*)    All other components within normal limits  CBC WITH DIFFERENTIAL/PLATELET - Abnormal; Notable for the following components:   RBC 4.13 (*)    Hemoglobin 12.5 (*)    HCT 36.5 (*)    All other components within normal limits  URINALYSIS, ROUTINE W REFLEX MICROSCOPIC - Abnormal; Notable for the following components:   Glucose, UA >1,000 (*)    Protein, ur TRACE (*)    All other components within normal limits  SEDIMENTATION RATE - Abnormal; Notable for the following components:   Sed Rate 117 (*)    All other components within normal limits  CULTURE, BLOOD (ROUTINE X 2)  CULTURE, BLOOD (ROUTINE X 2)  LACTIC  ACID, PLASMA  PROTIME-INR  LACTIC ACID, PLASMA  C-REACTIVE PROTEIN    EKG None  Radiology DG Foot Complete Right  Result Date: 11/11/2021 CLINICAL DATA:  Right foot wound infection. EXAM: RIGHT FOOT COMPLETE - 3+ VIEW COMPARISON:  None Available. FINDINGS: There is no evidence of fracture or dislocation. No lytic destruction is noted. Vascular calcifications are noted. Mild posterior calcaneal spurring is noted. IMPRESSION: No acute abnormality seen. Electronically Signed   By: Lupita Raider M.D.   On: 11/11/2021 12:35   DG Chest 2 View  Result Date: 11/11/2021 CLINICAL DATA:  Suspected sepsis EXAM: CHEST - 2 VIEW COMPARISON:  None FINDINGS: Heart is normal size. No confluent airspace opacities or effusions. Old healed left rib fracture. No acute bony abnormality. IMPRESSION: No active cardiopulmonary disease. Electronically Signed   By: Charlett Nose M.D.   On: 11/11/2021 11:01    Procedures .Critical Care  Performed by: Derwood Kaplan, MD Authorized by: Derwood Kaplan, MD   Critical care provider statement:    Critical care time (minutes):  32   Critical care was necessary to treat or prevent imminent or life-threatening deterioration of the following conditions:  Circulatory failure   Critical care was time spent personally by me on the following activities:  Development of treatment plan with patient or surrogate, discussions with consultants, evaluation of patient's response to treatment, examination of patient, ordering and review of laboratory studies, ordering and review of radiographic studies, ordering and performing treatments and interventions, pulse oximetry, re-evaluation of patient's condition and review of old charts      Medications Ordered in ED Medications  lactated ringers infusion ( Intravenous New Bag/Given 11/11/21 1224)  ceFEPIme (MAXIPIME) 2 g in sodium chloride 0.9 % 100 mL IVPB (has no administration in time range)  vancomycin (VANCOREADY) IVPB 1250  mg/250 mL (has no administration in time range)  ceFEPIme (MAXIPIME) 2 g in sodium chloride 0.9 % 100 mL IVPB (0 g Intravenous Stopped 11/11/21 1414)  metroNIDAZOLE (FLAGYL) IVPB 500 mg (0 mg Intravenous Stopped 11/11/21 1415)  vancomycin (VANCOCIN) IVPB 1000 mg/200 mL premix (0 mg Intravenous Stopped 11/11/21 1518)  sodium chloride 0.9 % bolus 1,000 mL (0 mLs Intravenous Stopped 11/11/21 1417)    ED Course/ Medical Decision Making/ A&P Clinical Course as of 11/11/21 1528  Wed Nov 11, 2021  1207 Comprehensive metabolic panel(!) Elevated blood sugar without anion gap. [AN]  1207 Alkaline Phosphatase(!): 194 Unclear etiology, incidental finding. [AN]  1207 Creatinine(!): 1.48 No recent creatinine in our system.  Patient's creatinine was 0.8, 10 years  ago. [AN]  1212 BUN(!): 43 BUN to creatinine ratio over 20.  We will ensure patient gets IV fluids. [AN]  1526 DG Foot Complete Right X-ray of the foot interpreted independently.  No evidence of gas. [AN]  1526 Late entry: Patient has a dusky toe.  Discussed case with Dr. [name], vascular surgery.  He will see the patient when he arrives.  Medicine team made aware of the finding as well.  Requested that they order ABI.  Patient to be admitted to [facility] now.  Family made aware that there could be delay in admission. [AN]  1527 Will not start heparin for now. [AN]    Clinical Course User Index [AN] Derwood Kaplan, MD                           Medical Decision Making Amount and/or Complexity of Data Reviewed Labs: ordered. Decision-making details documented in ED Course. Radiology: ordered.  Risk Prescription drug management. Decision regarding hospitalization.   81 year old male comes in with chief complaint of confusion, increased swelling and pain over his foot.  He has history of diabetes and CAD, no longer a smoker.  No history of A-fib.  Patient is noted to have edematous and erythematous right foot with second toe being dusky and  significant debris between the toe, no foul smell.  Differential diagnosis includes cellulitis of foot, osteomyelitis, peripheral arterial disease with vascular compromise to the second toe, gangrene.  Complications from infection -such as AKI, sepsis, DKA also and the possibility.  History provided by patient's daughter, ex-wife.  I also reviewed patient's PCP visit from earlier today and from 2021.  It does not appear that patient has known PAD.  He no longer smokes.  Has 2+ dorsalis pedis.  Appropriate lab work-up initiated including blood cultures and sepsis labs.  We will initiate him on antibiotics.  Given that he is diabetic, could have PAD we will give him broad-spectrum coverage for now and he can be de-escalated by the medicine team when appropriate.  Sed rate and CRP sent to see if patient needs MRI.  X-ray of the foot ordered to ensure there is no gas.    Final Clinical Impression(s) / ED Diagnoses Final diagnoses:  Cellulitis of right lower extremity  Blue toes  Encephalopathy    Rx / DC Orders ED Discharge Orders     None         Derwood Kaplan, MD 11/11/21 1528

## 2021-11-12 ENCOUNTER — Inpatient Hospital Stay (HOSPITAL_COMMUNITY): Payer: Medicare Other

## 2021-11-12 DIAGNOSIS — L03115 Cellulitis of right lower limb: Secondary | ICD-10-CM | POA: Diagnosis not present

## 2021-11-12 DIAGNOSIS — E119 Type 2 diabetes mellitus without complications: Secondary | ICD-10-CM | POA: Diagnosis not present

## 2021-11-12 DIAGNOSIS — M79674 Pain in right toe(s): Secondary | ICD-10-CM

## 2021-11-12 DIAGNOSIS — Z9861 Coronary angioplasty status: Secondary | ICD-10-CM

## 2021-11-12 DIAGNOSIS — L03119 Cellulitis of unspecified part of limb: Secondary | ICD-10-CM | POA: Diagnosis not present

## 2021-11-12 DIAGNOSIS — I2510 Atherosclerotic heart disease of native coronary artery without angina pectoris: Secondary | ICD-10-CM

## 2021-11-12 DIAGNOSIS — R52 Pain, unspecified: Secondary | ICD-10-CM | POA: Diagnosis not present

## 2021-11-12 DIAGNOSIS — L97519 Non-pressure chronic ulcer of other part of right foot with unspecified severity: Secondary | ICD-10-CM

## 2021-11-12 LAB — IRON AND TIBC
Iron: 22 ug/dL — ABNORMAL LOW (ref 45–182)
Saturation Ratios: 12 % — ABNORMAL LOW (ref 17.9–39.5)
TIBC: 188 ug/dL — ABNORMAL LOW (ref 250–450)
UIBC: 166 ug/dL

## 2021-11-12 LAB — GLUCOSE, CAPILLARY
Glucose-Capillary: 242 mg/dL — ABNORMAL HIGH (ref 70–99)
Glucose-Capillary: 170 mg/dL — ABNORMAL HIGH (ref 70–99)
Glucose-Capillary: 171 mg/dL — ABNORMAL HIGH (ref 70–99)
Glucose-Capillary: 137 mg/dL — ABNORMAL HIGH (ref 70–99)

## 2021-11-12 LAB — CBC
HCT: 31.9 % — ABNORMAL LOW (ref 39.0–52.0)
Hemoglobin: 11.1 g/dL — ABNORMAL LOW (ref 13.0–17.0)
MCH: 30.8 pg (ref 26.0–34.0)
MCHC: 34.8 g/dL (ref 30.0–36.0)
MCV: 88.6 fL (ref 80.0–100.0)
Platelets: 210 10*3/uL (ref 150–400)
RBC: 3.6 MIL/uL — ABNORMAL LOW (ref 4.22–5.81)
RDW: 11.8 % (ref 11.5–15.5)
WBC: 8.7 10*3/uL (ref 4.0–10.5)
nRBC: 0 % (ref 0.0–0.2)

## 2021-11-12 LAB — BASIC METABOLIC PANEL
Anion gap: 9 (ref 5–15)
BUN: 31 mg/dL — ABNORMAL HIGH (ref 8–23)
CO2: 23 mmol/L (ref 22–32)
Calcium: 8.1 mg/dL — ABNORMAL LOW (ref 8.9–10.3)
Chloride: 105 mmol/L (ref 98–111)
Creatinine, Ser: 1.29 mg/dL — ABNORMAL HIGH (ref 0.61–1.24)
GFR, Estimated: 55 mL/min — ABNORMAL LOW (ref >60)
Glucose, Bld: 235 mg/dL — ABNORMAL HIGH (ref 70–99)
Potassium: 4 mmol/L (ref 3.5–5.1)
Sodium: 137 mmol/L (ref 135–145)

## 2021-11-12 LAB — FOLATE: Folate: 16.2 ng/mL (ref >5.9)

## 2021-11-12 LAB — LACTIC ACID, PLASMA
Lactic Acid, Venous: 2.4 mmol/L (ref 0.5–1.9)
Lactic Acid, Venous: 1.7 mmol/L (ref 0.5–1.9)

## 2021-11-12 LAB — RETICULOCYTES
Immature Retic Fract: 22.4 % — ABNORMAL HIGH (ref 2.3–15.9)
RBC.: 3.68 MIL/uL — ABNORMAL LOW (ref 4.22–5.81)
Retic Count, Absolute: 54.1 10*3/uL (ref 19.0–186.0)
Retic Ct Pct: 1.5 % (ref 0.4–3.1)

## 2021-11-12 LAB — AMMONIA: Ammonia: 11 umol/L (ref 9–35)

## 2021-11-12 LAB — RPR: RPR Ser Ql: NONREACTIVE

## 2021-11-12 LAB — HEMOGLOBIN A1C
Hgb A1c MFr Bld: 9.4 % — ABNORMAL HIGH (ref 4.8–5.6)
Mean Plasma Glucose: 223.08 mg/dL

## 2021-11-12 LAB — VITAMIN B12: Vitamin B-12: 168 pg/mL — ABNORMAL LOW (ref 180–914)

## 2021-11-12 LAB — FERRITIN: Ferritin: 477 ng/mL — ABNORMAL HIGH (ref 24–336)

## 2021-11-12 LAB — TSH: TSH: 2.113 u[IU]/mL (ref 0.350–4.500)

## 2021-11-12 MED ORDER — LIVING WELL WITH DIABETES BOOK
Freq: Once | Status: AC
Start: 2021-11-12 — End: 2021-11-12
  Filled 2021-11-12: qty 1

## 2021-11-12 MED ORDER — GLUCERNA SHAKE PO LIQD
237.0000 mL | Freq: Three times a day (TID) | ORAL | Status: DC
  Administered 2021-11-12 – 2021-11-16 (×10): 237 mL via ORAL
  Filled 2021-11-12 (×2): qty 237

## 2021-11-12 MED ORDER — SODIUM CHLORIDE 0.45 % IV SOLN: INTRAVENOUS | Status: DC

## 2021-11-12 MED ORDER — CYANOCOBALAMIN 1000 MCG/ML IJ SOLN
1000.0000 ug | Freq: Every day | INTRAMUSCULAR | Status: AC
  Administered 2021-11-12 – 2021-11-14 (×2): 1000 ug via INTRAMUSCULAR
  Filled 2021-11-12 (×3): qty 1

## 2021-11-12 MED ORDER — INSULIN GLARGINE-YFGN 100 UNIT/ML ~~LOC~~ SOLN
5.0000 [IU] | Freq: Every day | SUBCUTANEOUS | Status: DC
  Administered 2021-11-12 – 2021-11-15 (×3): 5 [IU] via SUBCUTANEOUS
  Filled 2021-11-12 (×5): qty 0.05

## 2021-11-12 NOTE — Plan of Care (Signed)
  Problem: Nutrition: Goal: Adequate nutrition will be maintained Outcome: Progressing   Problem: Elimination: Goal: Will not experience complications related to bowel motility Outcome: Progressing Goal: Will not experience complications related to urinary retention Outcome: Progressing   

## 2021-11-12 NOTE — TOC Initial Note (Signed)
Transition of Care [facility]) - Initial/Assessment Note    Patient Details  Name: Wayne [name] MRN: [id number] Date of Birth: Jul 29, 1940  [facility] CM/SW Contact:    Curlene Labrum, RN Phone Number: 11/12/2021, 2:59 PM  Clinical Narrative:                 CM met with the patient and ex-wife, Rosalie Doctor at the bedside.  The patient lives alone at his home and his ex-wife visits with him daily during the daytime hours.  The patient continues to drive and has no DME at this time.  The patient with history of diabetes but has not taken medications for his medical problems in years and has failed to see a primary care for follow up.  The patient is scheduled for a vascular procedure tomorrow concerning his cellulitis and swelling in Right foot.  The patient will need PT/OT eval ordered after his procedure to follow up on patient's mobility.  The patient would like patient to have home health availability after discharge from the hospital.  The patient's family states that the patient would not be agreeable to SNF placement but only home health and/or Inpatient rehabilitation services if PT evaluates.  The patient has not seen a primary care physician but was agreeable to be set up at the [facility] on [address] - appointment was scheduled for first available with Dr. [place] on December 22, 2021 at 1:10 pm.  CM will continue to follow the patient for TOC needs.  Expected Discharge Plan: [place] Barriers to Discharge: Continued Medical Work up   Patient Goals and CMS Choice Patient states their goals for this hospitalization and ongoing recovery are:: To return home CMS Medicare.gov Compare Post Acute Care list provided to:: Patient Choice offered to / list presented to : Patient  Expected Discharge Plan and Services Expected Discharge Plan: [place]   Discharge Planning Services: CM Consult, Follow-up  appt scheduled   Living arrangements for the past 2 months: Single Family Home                                      Prior Living Arrangements/Services Living arrangements for the past 2 months: Single Family Home Lives with:: Self (The patient's ex-wife Rosalie Doctor) stays with him during the day at the home only - otherwise he lives alone) Patient language and need for interpreter reviewed:: Yes Do you feel safe going back to the place where you live?: Yes      Need for Family Participation in Patient Care: Yes (Comment) Care giver support system in place?: Yes (comment)   Criminal Activity/Legal Involvement Pertinent to Current Situation/Hospitalization: No - Comment as needed  Activities of Daily Living Home Assistive Devices/Equipment: Eyeglasses, Dentures (specify type) (upper dentures) ADL Screening (condition at time of admission) Patient's cognitive ability adequate to safely complete daily activities?: No Is the patient deaf or have difficulty hearing?: Yes Does the patient have difficulty seeing, even when wearing glasses/contacts?: No Does the patient have difficulty concentrating, remembering, or making decisions?: Yes Patient able to express need for assistance with ADLs?: Yes Does the patient have difficulty dressing or bathing?: No Independently performs ADLs?: Yes (appropriate for developmental age) Does the patient have difficulty walking or climbing stairs?: Yes Weakness of Legs: None Weakness of Arms/Hands: None  Permission Sought/Granted Permission sought to  share information with : Case Manager, Family Supports, PCP Permission granted to share information with : Yes, Verbal Permission Granted        Permission granted to share info w Relationship: Montgomery Favor - daughter [phone]     Emotional Assessment Appearance:: Appears stated age Attitude/Demeanor/Rapport: Gracious Affect (typically observed): Accepting Orientation: : Oriented  to Self, Oriented to Place, Oriented to  Time, Oriented to Situation Alcohol / Substance Use: Not Applicable (Patient quit smoking years ago) Psych Involvement: No (comment)  Admission diagnosis:  Encephalopathy [G93.40] Blue toes [R23.0] Cellulitis of right foot [L03.115] Cellulitis of right lower extremity [L03.115] Cellulitis [L03.90] Patient Active Problem List   Diagnosis Date Noted   Cellulitis of right foot 11/11/2021   Diabetes mellitus type 2 in nonobese ([place]) 11/11/2021   CAD S/P percutaneous coronary angioplasty 11/11/2021   Cellulitis 11/11/2021   PCP:  Patient, No Pcp Per Pharmacy:   [facility] [facility] [address] [place] [address] Phone: [phone] Fax: [phone]     Social Determinants of Health (SDOH) Interventions    Readmission Risk Interventions    11/12/2021    2:59 PM  Readmission Risk Prevention Plan  Post Dischage Appt Complete  Medication Screening Complete  Transportation Screening Complete

## 2021-11-12 NOTE — Progress Notes (Signed)
Progress Note  Patient: Najir Roop KDT:[id number] DOB: October 21, 1940  DOA: 11/11/2021  DOS: 11/12/2021    Brief hospital course: Denarius Sesler is an 81 y.o. male with a history of CAD s/p stenting, HTN, and T2DM who has not been on medications and presented to the ED from urgent care from home on 8/16 due to increasing redness, swelling, and blister formation over the right foot and 2nd toe for the past 5 days. Urgent care referred to ED due to concern for arterial occlusive disease. He was afebrile with dusky discoloration and ruptured blister on dorsum of right 2nd toe. XR did not show an acute bony abnormality. WBC 8.4k, lactic acid 3.0. Antibiotics initiated, ABI ordered and vascular surgery consulted. MRI of the foot revealed a nondisplaced proximal 2nd phalanx fracture, likely subacute with edema without abscess or osteomyelitis. Podiatry is also consulted. Angiogram is planned 8/18.  Assessment and Plan: Right foot cellulitis, diabetic foot infection:  - Continue broad coverage for now. No abscess or osteomyelitis. Will monitor blood cultures from 8/16, NGTD currently  Right 2nd phalanx fracture: Likely subacute. 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  - Podiatry consult is much appreciated.   PAD: Abnormal TBI's bilaterally with noncompressible ABIs, known CAD.  - Vascular surgery consulted, planning angiogram 8/18.  - Check lipid panel, though would qualify for statin regardless, will also plan to add ASA once ok'ed by vascular surgery.   T2DM, uncontrolled/untreated with hyperglycemia: HbA1c 9.4%.  - Continue SSI, glargine is ordered. - PCP follow up appointment has been made for patient to reinitiate regular follow up.   Vitamin B12 deficiency: May be contributing to AMS, though this is unclear. - Supplement  Anemia of chronic disease: Folic acid replete, Ferritin 477, iron 22, 12% saturation of a TIBC of only 188.  - B12 supp as  above - Monitor  Lactic acidosis: Cleared with IV fluids. There has been no fever or hemodynamic evidence of sepsis. Sepsis currently is felt to be ruled out. No leukocytosis, though significant elevation of inflammatory markers noted.   Confusion: More chronic, possible dementia. CT head without acute intracranial abnormality. There is advanced calcific atherosclerotic disease. Ammonia, RPR negative. 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  - Monitor BMP, avoid nephrotoxins. Will adjust diagnosis based on trends while hospitalized.   Subjective: Pt without pain, denies feeling ill. No other complaints either.  Objective: Vitals:   11/12/21 0251 11/12/21 0802 11/12/21 1145 11/12/21 1155  BP: 104/70 (!) 146/72 135/67   Pulse: 90 82 78   Resp: 18 13 13    Temp: 97.6 F (36.4 C) 98.3 F (36.8 C) 98.3 F (36.8 C)   TempSrc: Oral Oral Oral   SpO2: 92% 90% (!) 86% 92%  Weight:      Height:       Gen: Elderly, frail, pleasant male in no distress Pulm: Nonlabored breathing room air. Clear CV: Regular rate and rhythm. No murmur, rub, or gallop. No JVD, no generalized/dependent edema. GI: Abdomen soft, non-tender, non-distended, with normoactive bowel sounds.  Ext: Right foot cool, dry, with dusky appearance of dorsal 2nd digit and unroofed blistered skin, diffuse edema about the forefoot without palpable fluctuance Skin: No other rashes, lesions or ulcers on visualized skin. Neuro: Alert and incompletely oriented without focal neurological deficits. Psych: Calm  Data Personally reviewed: CBC: Recent Labs  Lab 11/11/21 1025 11/11/21 2155 11/12/21 0015  WBC 8.8 8.4 8.7  NEUTROABS 6.8  --   --  HGB 12.5* 10.6* 11.1*  HCT 36.5* 30.4* 31.9*  MCV 88.4 86.6 88.6  PLT 233 201 [id number]   Basic Metabolic Panel: Recent Labs  Lab 11/11/21 1025 11/11/21 2155 11/12/21 0015  NA 135  --  137  K 4.3  --  4.0  CL 98  --  105  CO2  25  --  23  GLUCOSE 311*  --  235*  BUN 43*  --  31*  CREATININE 1.48* 1.36* 1.29*  CALCIUM 9.5  --  8.1*   GFR: Estimated Creatinine Clearance: 49.9 mL/min (A) (by C-G formula based on SCr of 1.29 mg/dL (H)). Liver Function Tests: Recent Labs  Lab 11/11/21 1025  AST 14*  ALT 19  ALKPHOS 194*  BILITOT 1.2  PROT 8.0  ALBUMIN 4.0   No results for input(s): "LIPASE", "AMYLASE" in the last 168 hours. Recent Labs  Lab 11/12/21 0455  AMMONIA 11   Coagulation Profile: Recent Labs  Lab 11/11/21 1025  INR 1.1   Cardiac Enzymes: No results for input(s): "CKTOTAL", "CKMB", "CKMBINDEX", "TROPONINI" in the last 168 hours. BNP (last 3 results) No results for input(s): "PROBNP" in the last 8760 hours. HbA1C: Recent Labs    11/12/21 0015  HGBA1C 9.4*   CBG: Recent Labs  Lab 11/11/21 1836 11/11/21 2037 11/12/21 0757 11/12/21 1143 11/12/21 1628  GLUCAP 258* 240* 242* 170* 171*   Lipid Profile: No results for input(s): "CHOL", "HDL", "LDLCALC", "TRIG", "CHOLHDL", "LDLDIRECT" in the last 72 hours. Thyroid Function Tests: Recent Labs    11/12/21 0455  TSH 2.113   Anemia Panel: Recent Labs    11/12/21 0455  VITAMINB12 168*  FOLATE 16.2  FERRITIN 477*  TIBC 188*  IRON 22*  RETICCTPCT 1.5   Urine analysis:    Component Value Date/Time   COLORURINE YELLOW 11/11/2021 1239   APPEARANCEUR CLEAR 11/11/2021 1239   LABSPEC 1.018 11/11/2021 1239   PHURINE 6.0 11/11/2021 1239   GLUCOSEU >1,000 (A) 11/11/2021 1239   HGBUR NEGATIVE 11/11/2021 1239   BILIRUBINUR NEGATIVE 11/11/2021 1239   KETONESUR NEGATIVE 11/11/2021 1239   PROTEINUR TRACE (A) 11/11/2021 1239   UROBILINOGEN 0.2 06/13/2012 0325   NITRITE NEGATIVE 11/11/2021 1239   LEUKOCYTESUR NEGATIVE 11/11/2021 1239   Recent Results (from the past 240 hour(s))  Culture, blood (Routine x 2)     Status: None (Preliminary result)   Collection Time: 11/11/21 10:25 AM   Specimen: Left Antecubital; Blood  Result  Value Ref Range Status   Specimen Description   Final    LEFT ANTECUBITAL BLOOD Performed at [facility], [place] [address], [place], [place] [address]    Special Requests   Final    BOTTLES DRAWN AEROBIC AND ANAEROBIC Blood Culture results may not be optimal due to an excessive volume of blood received in culture bottles Performed at [facility], [address], [place], [place] [address]    Culture   Final    NO GROWTH < 24 HOURS Performed at [facility], [place] [address], [place], [place] [address]    Report Status PENDING  Incomplete  Culture, blood (Routine x 2)     Status: None (Preliminary result)   Collection Time: 11/11/21 10:25 AM   Specimen: Right Antecubital; Blood  Result Value Ref Range Status   Specimen Description   Final    RIGHT ANTECUBITAL BLOOD Performed at [facility], [place] [address] [place], [place] [address]    Special Requests   Final  BOTTLES DRAWN AEROBIC AND ANAEROBIC Blood Culture adequate volume Performed at [facility], [address], [place], [place] [address]    Culture   Final    NO GROWTH < 24 HOURS Performed at [facility], [place] [address], [place], [place] [address]    Report Status PENDING  Incomplete     VAS [place] ABI WITH/WO TBI  Result Date: 11/12/2021  LOWER EXTREMITY DOPPLER STUDY Patient Name:  TERRIL JUNKINS  Date of Exam:   11/12/2021 Medical Rec #: [id number]          Accession #:    [id number] Date of Birth: 05-20-1940         Patient Gender: M Patient Age:   64 years Exam Location:  [facility] Procedure:      VAS [place] ABI WITH/WO TBI Referring Phys: Gean Birchwood --------------------------------------------------------------------------------  Indications: Pain in right toe. High Risk Factors: Diabetes.  Comparison Study: No previous exam noted. Performing Technologist: Bobetta Lime BS, RVT  Examination Guidelines: A complete evaluation includes at minimum,  Doppler waveform signals and systolic blood pressure reading at the level of bilateral brachial, anterior tibial, and posterior tibial arteries, when vessel segments are accessible. Bilateral testing is considered an integral part of a complete examination. Photoelectric Plethysmograph (PPG) waveforms and toe systolic pressure readings are included as required and additional duplex testing as needed. Limited examinations for reoccurring indications may be performed as noted.  ABI Findings: +---------+------------------+-----+-------------------+--------+ Right    Rt Pressure (mmHg)IndexWaveform           Comment  +---------+------------------+-----+-------------------+--------+ Brachial 157                    triphasic                   +---------+------------------+-----+-------------------+--------+ PTA      255               1.60 dampened monophasic         +---------+------------------+-----+-------------------+--------+ DP       103               0.65 dampened monophasic         +---------+------------------+-----+-------------------+--------+ Great Toe49                0.31                             +---------+------------------+-----+-------------------+--------+ +---------+------------------+-----+--------------------+-------+ Left     Lt Pressure (mmHg)IndexWaveform            Comment +---------+------------------+-----+--------------------+-------+ Brachial 159                    triphasic                   +---------+------------------+-----+--------------------+-------+ PTA      255               1.60 dampened monophasic         +---------+------------------+-----+--------------------+-------+ DP       255               1.60 Hyperemic/monophasic        +---------+------------------+-----+--------------------+-------+ Great Toe66                0.42                             +---------+------------------+-----+--------------------+-------+  +-------+-----------+-----------+------------+------------+  ABI/TBIToday's ABIToday's TBIPrevious ABIPrevious TBI +-------+-----------+-----------+------------+------------+ Right  [place]         0.31                                +-------+-----------+-----------+------------+------------+ Left   [place]         0.42                                +-------+-----------+-----------+------------+------------+  Summary: Right: Resting right ankle-brachial index indicates noncompressible right lower extremity arteries. The right toe-brachial index is abnormal. Left: Resting left ankle-brachial index indicates noncompressible left lower extremity arteries. The left toe-brachial index is abnormal. *See table(s) above for measurements and observations.  Electronically signed by Monica [name] MD on 11/12/2021 at 3:13:14 PM.    Final    MR FOOT RIGHT WO CONTRAST  Result Date: 11/12/2021 CLINICAL DATA:  Foot swelling, nondiabetic, osteomyelitis suspected EXAM: MRI OF THE RIGHT FOREFOOT WITHOUT CONTRAST TECHNIQUE: Multiplanar, multisequence MR imaging of the right forefoot was performed. No intravenous contrast was administered. COMPARISON:  Right foot radiograph 11/11/2021 FINDINGS: Bones/Joint/Cartilage Nondisplaced fracture at the base of the second toe proximal phalanx, which may be subacute. Minimal marrow edema in the proximal phalanx. No other significant marrow signal alteration. Ligaments Intact Lisfranc ligament.  Intact MTP collateral ligaments. Muscles and Tendons No acute tendon tear. Diffuse intramuscular edema and atrophy in the foot as is commonly seen in diabetics. Soft tissues Generalized soft tissue swelling of the foot, with confluent edema along the second toe (series 4 images 8-15). No focal fluid collection. There is an adjacent blister like skin lesion. IMPRESSION: Nondisplaced fracture at base of the second toe proximal phalanx, which may be subacute. Adjacent confluent edema and a  blister-like skin lesion noted along the plantar aspect of the second toe noted. No evidence of soft tissue abscess or osteomyelitis. Electronically Signed   By: Maurine Simmering M.D.   On: 11/12/2021 10:05   CT HEAD WO CONTRAST (5MM)  Result Date: 11/12/2021 CLINICAL DATA:  81 year old male with altered mental status. EXAM: CT HEAD WITHOUT CONTRAST TECHNIQUE: Contiguous axial images were obtained from the base of the skull through the vertex without intravenous contrast. RADIATION DOSE REDUCTION: This exam was performed according to the departmental dose-optimization program which includes automated exposure control, adjustment of the mA and/or kV according to patient size and/or use of iterative reconstruction technique. COMPARISON:  CT cervical spine 02/27/2009. FINDINGS: Brain: Cerebral volume loss appears fairly generalized. No midline shift, ventriculomegaly, mass effect, evidence of mass lesion, intracranial hemorrhage or evidence of cortically based acute infarction. Gray-white matter differentiation is within normal limits for age in both hemispheres. No cortical encephalomalacia identified. Vascular: Extensive Calcified atherosclerosis at the skull base. No suspicious intracranial vascular hyperdensity. Skull: No acute osseous abnormality identified. Sinuses/Orbits: Visualized paranasal sinuses and mastoids are well aerated. Other: No acute orbit or scalp soft tissue finding. Fairly extensive calcified scalp vessel atherosclerosis. IMPRESSION: 1. No acute intracranial abnormality. Unremarkable for age noncontrast CT appearance of the brain. 2. Advanced calcified atherosclerosis, including involvement of scalp vessels. This appearance can be seen with End stage renal disease. Electronically Signed   By: Genevie Ann M.D.   On: 11/12/2021 05:49   DG Foot Complete Right  Result Date: 11/11/2021 CLINICAL DATA:  Right foot wound infection. EXAM: RIGHT FOOT COMPLETE - 3+ VIEW COMPARISON:  None Available. FINDINGS:  There  is no evidence of fracture or dislocation. No lytic destruction is noted. Vascular calcifications are noted. Mild posterior calcaneal spurring is noted. IMPRESSION: No acute abnormality seen. Electronically Signed   By: Lupita Raider M.D.   On: 11/11/2021 12:35   DG Chest 2 View  Result Date: 11/11/2021 CLINICAL DATA:  Suspected sepsis EXAM: CHEST - 2 VIEW COMPARISON:  None FINDINGS: Heart is normal size. No confluent airspace opacities or effusions. Old healed left rib fracture. No acute bony abnormality. IMPRESSION: No active cardiopulmonary disease. Electronically Signed   By: Charlett Nose M.D.   On: 11/11/2021 11:01    Family Communication: None at bedside this AM or PM  Disposition: Status is: Inpatient Remains inpatient appropriate because: Angiogram, IV antibiotics, possible need for amputation Planned Discharge Destination:  TBD, will get PT/OT evaluations once more clinically stable.  Tyrone Nine, MD 11/12/2021 4:58 PM Page by [email or web address]

## 2021-11-12 NOTE — Progress Notes (Signed)
ABI/TBI study completed. Please see CV Proc for preliminary results.  Victorino Dike  Ilija Maxim BS, RVT 11/12/2021 12:23 PM

## 2021-11-12 NOTE — Consult Note (Signed)
Reason for Consult: Cellulitis right foot, toe fracture Referring Physician: Dr. Hazeline Junker, MD  Wayne [name] is an 81 y.o. male.  HPI: 81 y.o. male with a history of CAD s/p stenting, HTN, and T2DM who has not been on medications and presented to the ED from urgent care from home on 8/16 due to increasing redness, swelling, and blister formation over the right foot and 2nd toe for the past 5 days. Urgent care referred to ED due to concern for arterial occlusive disease. He was afebrile with dusky discoloration and ruptured blister on dorsum of right 2nd toe. XR did not show an acute bony abnormality.  Patient does not recall any injury to his foot.  He is scheduled for angio tomorrow with vascular surgery.  Past Medical History:  Diagnosis Date   Coronary artery disease    Diabetes mellitus without complication (HCC)     Past Surgical History:  Procedure Laterality Date   CORONARY ANGIOPLASTY WITH STENT PLACEMENT      Family History  Problem Relation Age of Onset   Diabetes type II Neg Hx     Social History:  reports that he has quit smoking. His smoking use included cigarettes. He does not have any smokeless tobacco history on file. He reports that he does not drink alcohol and does not use drugs.  Allergies: No Known Allergies  Medications: I have reviewed the patient's current medications.  Results for orders placed or performed during the hospital encounter of 11/11/21 (from the past 48 hour(s))  Comprehensive metabolic panel     Status: Abnormal   Collection Time: 11/11/21 10:25 AM  Result Value Ref Range   Sodium 135 135 - 145 mmol/L   Potassium 4.3 3.5 - 5.1 mmol/L   Chloride 98 98 - 111 mmol/L   CO2 25 22 - 32 mmol/L   Glucose, Bld 311 (H) 70 - 99 mg/dL    Comment: Glucose reference range applies only to samples taken after fasting for at least 8 hours.   BUN 43 (H) 8 - 23 mg/dL   Creatinine, Ser 0.17 (H) 0.61 - 1.24 mg/dL   Calcium 9.5 8.9 - 49.4 mg/dL   Total  Protein 8.0 6.5 - 8.1 g/dL   Albumin 4.0 3.5 - 5.0 g/dL   AST 14 (L) 15 - 41 U/L   ALT 19 0 - 44 U/L   Alkaline Phosphatase 194 (H) 38 - 126 U/L   Total Bilirubin 1.2 0.3 - 1.2 mg/dL   GFR, Estimated 47 (L) >60 mL/min    Comment: (NOTE) Calculated using the CKD-EPI Creatinine Equation (2021)    Anion gap 12 5 - 15    Comment: Performed at [facility], [address] [place], [place] [address]  Lactic acid, plasma     Status: None   Collection Time: 11/11/21 10:25 AM  Result Value Ref Range   Lactic Acid, Venous 1.2 0.5 - 1.9 mmol/L    Comment: Performed at [facility], [address], [place], [place] [address]  CBC with Differential     Status: Abnormal   Collection Time: 11/11/21 10:25 AM  Result Value Ref Range   WBC 8.8 4.0 - 10.5 K/uL   RBC 4.13 (L) 4.22 - 5.81 MIL/uL   Hemoglobin 12.5 (L) 13.0 - 17.0 g/dL   HCT 46.6 (L) 59.9 - 35.7 %   MCV 88.4 80.0 - 100.0 fL   MCH 30.3 26.0 - 34.0 pg   MCHC 34.2 30.0 - 36.0 g/dL  RDW 12.0 11.5 - 15.5 %   Platelets 233 150 - 400 K/uL   nRBC 0.0 0.0 - 0.2 %   Neutrophils Relative % 78 %   Neutro Abs 6.8 1.7 - 7.7 K/uL   Lymphocytes Relative 14 %   Lymphs Abs 1.2 0.7 - 4.0 K/uL   Monocytes Relative 7 %   Monocytes Absolute 0.6 0.1 - 1.0 K/uL   Eosinophils Relative 1 %   Eosinophils Absolute 0.1 0.0 - 0.5 K/uL   Basophils Relative 0 %   Basophils Absolute 0.0 0.0 - 0.1 K/uL   Immature Granulocytes 0 %   Abs Immature Granulocytes 0.03 0.00 - 0.07 K/uL    Comment: Performed at [facility], [address] [place], [place] [address]  Protime-INR     Status: None   Collection Time: 11/11/21 10:25 AM  Result Value Ref Range   Prothrombin Time 14.3 11.4 - 15.2 seconds   INR 1.1 0.8 - 1.2    Comment: (NOTE) INR goal varies based on device and disease states. Performed at [facility], [address], [place], [place] [address]   Culture, blood  (Routine x 2)     Status: None (Preliminary result)   Collection Time: 11/11/21 10:25 AM   Specimen: Left Antecubital; Blood  Result Value Ref Range   Specimen Description      LEFT ANTECUBITAL BLOOD Performed at [facility] [address] [place], [place] [address]    Special Requests      BOTTLES DRAWN AEROBIC AND ANAEROBIC Blood Culture results may not be optimal due to an excessive volume of blood received in culture bottles Performed at [facility], [address] [place], [place] [address]    Culture      NO GROWTH < 24 HOURS Performed at [facility], [place] [address], [place], [place] [address]    Report Status PENDING   Culture, blood (Routine x 2)     Status: None (Preliminary result)   Collection Time: 11/11/21 10:25 AM   Specimen: Right Antecubital; Blood  Result Value Ref Range   Specimen Description      RIGHT ANTECUBITAL BLOOD Performed at [facility], [place] [address] [place], [place] [address]    Special Requests      BOTTLES DRAWN AEROBIC AND ANAEROBIC Blood Culture adequate volume Performed at [facility], [address], [place], [place] [address]    Culture      NO GROWTH < 24 HOURS Performed at [facility], [place] [address], [place], [place] [address]    Report Status PENDING   Sedimentation rate     Status: Abnormal   Collection Time: 11/11/21 10:25 AM  Result Value Ref Range   Sed Rate 117 (H) 0 - 16 mm/hr    Comment: Performed at [facility], [place], [place] [address]  C-reactive protein     Status: Abnormal   Collection Time: 11/11/21 10:25 AM  Result Value Ref Range   CRP 13.7 (H) <1.0 mg/dL    Comment: Performed at [facility], [address] [place],  [address]  Urinalysis, Routine w reflex microscopic Urine, Clean Catch     Status: Abnormal   Collection Time: 11/11/21 12:39 PM  Result Value Ref Range   Color, Urine YELLOW  YELLOW   APPearance CLEAR CLEAR   Specific Gravity, Urine 1.018 1.005 - 1.030   pH 6.0 5.0 - 8.0  Glucose, UA >1,000 (A) NEGATIVE mg/dL   Hgb urine dipstick NEGATIVE NEGATIVE   Bilirubin Urine NEGATIVE NEGATIVE   Ketones, ur NEGATIVE NEGATIVE mg/dL   Protein, ur TRACE (A) NEGATIVE mg/dL   Nitrite NEGATIVE NEGATIVE   Leukocytes,Ua NEGATIVE NEGATIVE   RBC / HPF 0-5 0 - 5 RBC/hpf   WBC, UA 0-5 0 - 5 WBC/hpf   Mucus PRESENT    Hyaline Casts, UA PRESENT     Comment: Performed at [facility], [address] [place], [place] [address]  CBG monitoring, ED     Status: Abnormal   Collection Time: 11/11/21  6:36 PM  Result Value Ref Range   Glucose-Capillary 258 (H) 70 - 99 mg/dL    Comment: Glucose reference range applies only to samples taken after fasting for at least 8 hours.  Lactic acid, plasma     Status: Abnormal   Collection Time: 11/11/21  8:09 PM  Result Value Ref Range   Lactic Acid, Venous 3.0 (HH) 0.5 - 1.9 mmol/L    Comment: CRITICAL RESULT CALLED TO, READ BACK BY AND VERIFIED WITH I HEAVEN,RN 2137 11/11/2021 WBOND Performed at [facility], [place] [address], [place], [place] [address]   Glucose, capillary     Status: Abnormal   Collection Time: 11/11/21  8:37 PM  Result Value Ref Range   Glucose-Capillary 240 (H) 70 - 99 mg/dL    Comment: Glucose reference range applies only to samples taken after fasting for at least 8 hours.  CBC     Status: Abnormal   Collection Time: 11/11/21  9:55 PM  Result Value Ref Range   WBC 8.4 4.0 - 10.5 K/uL   RBC 3.51 (L) 4.22 - 5.81 MIL/uL   Hemoglobin 10.6 (L) 13.0 - 17.0 g/dL   HCT 30.4 (L) 39.0 - 52.0 %   MCV 86.6 80.0 - 100.0 fL   MCH 30.2 26.0 - 34.0 pg   MCHC 34.9 30.0 - 36.0 g/dL   RDW 11.9 11.5 - 15.5 %   Platelets 201 150 - 400 K/uL   nRBC 0.0 0.0 - 0.2 %    Comment: Performed at [facility], [place] [address] [place], [place] [address]  Creatinine, serum     Status: Abnormal   Collection  Time: 11/11/21  9:55 PM  Result Value Ref Range   Creatinine, Ser 1.36 (H) 0.61 - 1.24 mg/dL   GFR, Estimated 52 (L) >60 mL/min    Comment: (NOTE) Calculated using the CKD-EPI Creatinine Equation (2021) Performed at [facility] [address] [place], [place] [id number]   Basic metabolic panel     Status: Abnormal   Collection Time: 11/12/21 12:15 AM  Result Value Ref Range   Sodium 137 135 - 145 mmol/L   Potassium 4.0 3.5 - 5.1 mmol/L   Chloride 105 98 - 111 mmol/L   CO2 23 22 - 32 mmol/L   Glucose, Bld 235 (H) 70 - 99 mg/dL    Comment: Glucose reference range applies only to samples taken after fasting for at least 8 hours.   BUN 31 (H) 8 - 23 mg/dL   Creatinine, Ser 1.29 (H) 0.61 - 1.24 mg/dL   Calcium 8.1 (L) 8.9 - 10.3 mg/dL   GFR, Estimated 55 (L) >60 mL/min    Comment: (NOTE) Calculated using the CKD-EPI Creatinine Equation (2021)    Anion gap 9 5 - 15    Comment: Performed at [facility]  [address], [place], [place] [address]  CBC     Status: Abnormal   Collection Time: 11/12/21 12:15 AM  Result Value Ref Range   WBC 8.7 4.0 - 10.5 K/uL   RBC 3.60 (L) 4.22 - 5.81 MIL/uL   Hemoglobin 11.1 (L) 13.0 - 17.0 g/dL   HCT 31.9 (L) 39.0 - 52.0 %   MCV 88.6 80.0 - 100.0 fL   MCH 30.8 26.0 - 34.0 pg   MCHC 34.8 30.0 - 36.0 g/dL   RDW 11.8 11.5 - 15.5 %   Platelets 210 150 - 400 K/uL   nRBC 0.0 0.0 - 0.2 %    Comment: Performed at [facility], [place] [address] [place], [place] [address]  Hemoglobin A1c     Status: Abnormal   Collection Time: 11/12/21 12:15 AM  Result Value Ref Range   Hgb A1c MFr Bld 9.4 (H) 4.8 - 5.6 %    Comment: (NOTE) Pre diabetes:          5.7%-6.4%  Diabetes:              >6.4%  Glycemic control for   <7.0% adults with diabetes    Mean Plasma Glucose 223.08 mg/dL    Comment: Performed at [facility] [address], [place], [place] [address]  Lactic acid, plasma     Status: Abnormal   Collection Time:  11/12/21 12:15 AM  Result Value Ref Range   Lactic Acid, Venous 2.4 (HH) 0.5 - 1.9 mmol/L    Comment: CRITICAL VALUE NOTED. VALUE IS CONSISTENT WITH PREVIOUSLY REPORTED/CALLED VALUE Performed at [facility], [place] [address] [place], [place] [address]   Lactic acid, plasma     Status: None   Collection Time: 11/12/21  2:47 AM  Result Value Ref Range   Lactic Acid, Venous 1.7 0.5 - 1.9 mmol/L    Comment: Performed at [facility] [address] [place], [place] [address]  Vitamin B12     Status: Abnormal   Collection Time: 11/12/21  4:55 AM  Result Value Ref Range   Vitamin B-12 168 (L) 180 - 914 pg/mL    Comment: (NOTE) This assay is not validated for testing neonatal or myeloproliferative syndrome specimens for Vitamin B12 levels. Performed at [facility], [place] [address] [place], [place] [address]   Folate     Status: None   Collection Time: 11/12/21  4:55 AM  Result Value Ref Range   Folate 16.2 >5.9 ng/mL    Comment: Performed at [facility] [address], [place], [place] [address]  Iron and TIBC     Status: Abnormal   Collection Time: 11/12/21  4:55 AM  Result Value Ref Range   Iron 22 (L) 45 - 182 ug/dL   TIBC 188 (L) 250 - 450 ug/dL   Saturation Ratios 12 (L) 17.9 - 39.5 %   UIBC 166 ug/dL    Comment: Performed at [facility], [place] [address], [place], [place] [address]  Ferritin     Status: Abnormal   Collection Time: 11/12/21  4:55 AM  Result Value Ref Range   Ferritin 477 (H) 24 - 336 ng/mL    Comment: Performed at [facility], [place] [address], [place],  [address]  Reticulocytes     Status: Abnormal   Collection Time: 11/12/21  4:55 AM  Result Value Ref Range   Retic Ct Pct 1.5 0.4 - 3.1 %  RBC. 3.68 (L) 4.22 - 5.81 MIL/uL   Retic Count, Absolute 54.1 19.0 - 186.0 K/uL   Immature Retic Fract 22.4 (H) 2.3 - 15.9 %    Comment: Performed at [facility] [address], [place], [place] [address]  TSH      Status: None   Collection Time: 11/12/21  4:55 AM  Result Value Ref Range   TSH 2.113 0.350 - 4.500 uIU/mL    Comment: Performed by a 3rd Generation assay with a functional sensitivity of <=0.01 uIU/mL. Performed at [facility], [place] [address], [place], [place] [address]   Ammonia     Status: None   Collection Time: 11/12/21  4:55 AM  Result Value Ref Range   Ammonia 11 9 - 35 umol/L    Comment: Performed at [facility], [place] [address] [place], [place] [address]  RPR     Status: None   Collection Time: 11/12/21  4:55 AM  Result Value Ref Range   RPR Ser Ql NON REACTIVE NON REACTIVE    Comment: Performed at [facility] [address], [place], [place] [address]  Glucose, capillary     Status: Abnormal   Collection Time: 11/12/21  7:57 AM  Result Value Ref Range   Glucose-Capillary 242 (H) 70 - 99 mg/dL    Comment: Glucose reference range applies only to samples taken after fasting for at least 8 hours.  Glucose, capillary     Status: Abnormal   Collection Time: 11/12/21 11:43 AM  Result Value Ref Range   Glucose-Capillary 170 (H) 70 - 99 mg/dL    Comment: Glucose reference range applies only to samples taken after fasting for at least 8 hours.  Glucose, capillary     Status: Abnormal   Collection Time: 11/12/21  4:28 PM  Result Value Ref Range   Glucose-Capillary 171 (H) 70 - 99 mg/dL    Comment: Glucose reference range applies only to samples taken after fasting for at least 8 hours.    VAS [place] ABI WITH/WO TBI  Result Date: 11/12/2021  LOWER EXTREMITY DOPPLER STUDY Patient Name:  IZAK KIZEWSKI  Date of Exam:   11/12/2021 Medical Rec #: [id number]          Accession #:    [id number] Date of Birth: 18-Jan-1941         Patient Gender: M Patient Age:   12 years Exam Location:  [facility] Procedure:      VAS [place] ABI WITH/WO TBI Referring Phys: Gean Birchwood --------------------------------------------------------------------------------  Indications:  Pain in right toe. High Risk Factors: Diabetes.  Comparison Study: No previous exam noted. Performing Technologist: Bobetta Lime BS, RVT  Examination Guidelines: A complete evaluation includes at minimum, Doppler waveform signals and systolic blood pressure reading at the level of bilateral brachial, anterior tibial, and posterior tibial arteries, when vessel segments are accessible. Bilateral testing is considered an integral part of a complete examination. Photoelectric Plethysmograph (PPG) waveforms and toe systolic pressure readings are included as required and additional duplex testing as needed. Limited examinations for reoccurring indications may be performed as noted.  ABI Findings: +---------+------------------+-----+-------------------+--------+ Right    Rt Pressure (mmHg)IndexWaveform           Comment  +---------+------------------+-----+-------------------+--------+ Brachial 157                    triphasic                   +---------+------------------+-----+-------------------+--------+  PTA      255               1.60 dampened monophasic         +---------+------------------+-----+-------------------+--------+ DP       103               0.65 dampened monophasic         +---------+------------------+-----+-------------------+--------+ Great Toe49                0.31                             +---------+------------------+-----+-------------------+--------+ +---------+------------------+-----+--------------------+-------+ Left     Lt Pressure (mmHg)IndexWaveform            Comment +---------+------------------+-----+--------------------+-------+ Brachial 159                    triphasic                   +---------+------------------+-----+--------------------+-------+ PTA      255               1.60 dampened monophasic         +---------+------------------+-----+--------------------+-------+ DP       255               1.60 Hyperemic/monophasic         +---------+------------------+-----+--------------------+-------+ Great Toe66                0.42                             +---------+------------------+-----+--------------------+-------+ +-------+-----------+-----------+------------+------------+ ABI/TBIToday's ABIToday's TBIPrevious ABIPrevious TBI +-------+-----------+-----------+------------+------------+ Right  [place]         0.31                                +-------+-----------+-----------+------------+------------+ Left   [place]         0.42                                +-------+-----------+-----------+------------+------------+  Summary: Right: Resting right ankle-brachial index indicates noncompressible right lower extremity arteries. The right toe-brachial index is abnormal. Left: Resting left ankle-brachial index indicates noncompressible left lower extremity arteries. The left toe-brachial index is abnormal. *See table(s) above for measurements and observations.  Electronically signed by Sherald Hess MD on 11/12/2021 at 3:13:14 PM.    Final    MR FOOT RIGHT WO CONTRAST  Result Date: 11/12/2021 CLINICAL DATA:  Foot swelling, nondiabetic, osteomyelitis suspected EXAM: MRI OF THE RIGHT FOREFOOT WITHOUT CONTRAST TECHNIQUE: Multiplanar, multisequence MR imaging of the right forefoot was performed. No intravenous contrast was administered. COMPARISON:  Right foot radiograph 11/11/2021 FINDINGS: Bones/Joint/Cartilage Nondisplaced fracture at the base of the second toe proximal phalanx, which may be subacute. Minimal marrow edema in the proximal phalanx. No other significant marrow signal alteration. Ligaments Intact Lisfranc ligament.  Intact MTP collateral ligaments. Muscles and Tendons No acute tendon tear. Diffuse intramuscular edema and atrophy in the foot as is commonly seen in diabetics. Soft tissues Generalized soft tissue swelling of the foot, with confluent edema along the second toe (series 4 images 8-15). No  focal fluid collection. There is an adjacent blister like skin lesion. IMPRESSION: Nondisplaced fracture at base of the second toe proximal phalanx, which  may be subacute. Adjacent confluent edema and a blister-like skin lesion noted along the plantar aspect of the second toe noted. No evidence of soft tissue abscess or osteomyelitis. Electronically Signed   By: Caprice Renshaw M.D.   On: 11/12/2021 10:05   CT HEAD WO CONTRAST ( )  Result Date: 11/12/2021 CLINICAL DATA:  81 year old male with altered mental status. EXAM: CT HEAD WITHOUT CONTRAST TECHNIQUE: Contiguous axial images were obtained from the base of the skull through the vertex without intravenous contrast. RADIATION DOSE REDUCTION: This exam was performed according to the departmental dose-optimization program which includes automated exposure control, adjustment of the mA and/or kV according to patient size and/or use of iterative reconstruction technique. COMPARISON:  CT cervical spine 02/27/2009. FINDINGS: Brain: Cerebral volume loss appears fairly generalized. No midline shift, ventriculomegaly, mass effect, evidence of mass lesion, intracranial hemorrhage or evidence of cortically based acute infarction. Gray-white matter differentiation is within normal limits for age in both hemispheres. No cortical encephalomalacia identified. Vascular: Extensive Calcified atherosclerosis at the skull base. No suspicious intracranial vascular hyperdensity. Skull: No acute osseous abnormality identified. Sinuses/Orbits: Visualized paranasal sinuses and mastoids are well aerated. Other: No acute orbit or scalp soft tissue finding. Fairly extensive calcified scalp vessel atherosclerosis. IMPRESSION: 1. No acute intracranial abnormality. Unremarkable for age noncontrast CT appearance of the brain. 2. Advanced calcified atherosclerosis, including involvement of scalp vessels. This appearance can be seen with End stage renal disease. Electronically Signed   By: Odessa Fleming M.D.   On: 11/12/2021 05:49   DG Foot Complete Right  Result Date: 11/11/2021 CLINICAL DATA:  Right foot wound infection. EXAM: RIGHT FOOT COMPLETE - 3+ VIEW COMPARISON:  None Available. FINDINGS: There is no evidence of fracture or dislocation. No lytic destruction is noted. Vascular calcifications are noted. Mild posterior calcaneal spurring is noted. IMPRESSION: No acute abnormality seen. Electronically Signed   By: Lupita Raider M.D.   On: 11/11/2021 12:35   DG Chest 2 View  Result Date: 11/11/2021 CLINICAL DATA:  Suspected sepsis EXAM: CHEST - 2 VIEW COMPARISON:  None FINDINGS: Heart is normal size. No confluent airspace opacities or effusions. Old healed left rib fracture. No acute bony abnormality. IMPRESSION: No active cardiopulmonary disease. Electronically Signed   By: Charlett Nose M.D.   On: 11/11/2021 11:01    Review of Systems Blood pressure 135/67, pulse 78, temperature 98.3 F (36.8 C), temperature source Oral, resp. rate 13, height 6\' 1"  (1.854 m), weight 81.7 kg, SpO2 92 %. Physical Exam General: NAD  Dermatological: There is erythema, cellulitis noted extending to the dorsal midfoot.  The second toe is dusky.  There is no drainage or pus there is no fluctuance or crepitation.  Appears to have an old blister on the second toe.  Vascular: Unable to palpate pulses.  Neruologic: Sensation decreased  Musculoskeletal: No pain on exam   Assessment/Plan: Right second toe fracture, cellulitis, PAD, uncontrolled diabetes  -X-rays were obtained.  Likely subacute fracture of the 2nd toe.  Per MRI did not appear to have osteomyelitis.  There is no abscess. -White blood cell count normal at 8.7, afebrile. -Concerned with the appearance of the foot.  Scheduled for angio tomorrow.  Wound to monitor the foot closely is at high risk of at least toe amputation. -Continue broad-spectrum antibiotics -Immobilization in surgical shoe -Podiatry will continue to follow.  No acute  surgical intervention at this time, however will monitor.    Vivi Barrack 11/12/2021, 5:24 PM

## 2021-11-12 NOTE — Consult Note (Addendum)
Hospital Consult    Reason for Consult: Tissue loss right foot Requesting Physician: [facility] MRN #:  [id number]  History of Present Illness: This is a 81 y.o. male with past medical history significant for CAD with history of PCI, type 2 diabetes mellitus, and hypertension.  He was transferred to [facility] for evaluation of tissue loss on right foot.  Patient states he had blistering and redness on his right foot over the past week.  History is difficult to obtain from the patient and he lives alone.  He denies any pain in the foot.  He denies any history of claudication or prior ulcerations.  He denies tobacco use.  Past Medical History:  Diagnosis Date   Coronary artery disease    Diabetes mellitus without complication (HCC)     Past Surgical History:  Procedure Laterality Date   CORONARY ANGIOPLASTY WITH STENT PLACEMENT      No Known Allergies  Prior to Admission medications   Medication Sig Start Date End Date Taking? Authorizing Provider  ibuprofen (ADVIL) 200 MG tablet Take 200 mg by mouth every 6 (six) hours as needed.   Yes [provider]  cyclobenzaprine (FLEXERIL) 10 MG tablet Take 1 tablet (10 mg total) by mouth 2 (two) times daily as needed for muscle spasms. Patient not taking: Reported on 11/11/2021 06/13/12   Eber Hong, MD  glucose blood test strip Use as instructed 06/13/12   Eber Hong, MD  lisinopril (PRINIVIL,ZESTRIL) 10 MG tablet Take 1 tablet (10 mg total) by mouth daily. Patient not taking: Reported on 11/11/2021 06/13/12   Eber Hong, MD  metFORMIN (GLUCOPHAGE) 500 MG tablet Take 1 tablet (500 mg total) by mouth 2 (two) times daily with a meal. Patient not taking: Reported on 11/11/2021 06/13/12   Eber Hong, MD  naproxen (NAPROSYN) 500 MG tablet Take 1 tablet (500 mg total) by mouth 2 (two) times daily with a meal. Patient not taking: Reported on 11/11/2021 06/13/12   Eber Hong, MD    Social History   Socioeconomic History   Marital  status: Single    Spouse name: Not on file   Number of children: Not on file   Years of education: Not on file   Highest education level: Not on file  Occupational History   Not on file  Tobacco Use   Smoking status: Former    Types: Cigarettes   Smokeless tobacco: Not on file  Substance and Sexual Activity   Alcohol use: No   Drug use: No   Sexual activity: Not on file  Other Topics Concern   Not on file  Social History Narrative   Not on file   Social Determinants of Health   Financial Resource Strain: Not on file  Food Insecurity: Not on file  Transportation Needs: Not on file  Physical Activity: Not on file  Stress: Not on file  Social Connections: Not on file  Intimate Partner Violence: Not on file     Family History  Problem Relation Age of Onset   Diabetes type II Neg Hx     ROS: Otherwise negative unless mentioned in HPI  Physical Examination  Vitals:   11/11/21 2300 11/12/21 0251  BP: (!) 160/78 104/70  Pulse: 88 90  Resp: 18 18  Temp: 97.6 F (36.4 C) 97.6 F (36.4 C)  SpO2: 98% 92%   Body mass index is 23.76 kg/m.  General:  WDWN in NAD Gait: Not observed HENT: WNL, normocephalic Pulmonary: normal non-labored breathing, without Rales,  rhonchi,  wheezing Cardiac: regular Abdomen:  soft, NT/ND, no masses Skin: without rashes Vascular Exam/Pulses: Symmetrical femoral pulses; absent pedal pulses bilaterally Extremities: Dusky right second toe with erythematous forefoot Musculoskeletal: no muscle wasting or atrophy  Neurologic: A&O X 3;  No focal weakness or paresthesias are detected; speech is fluent/normal Psychiatric:  The pt has Normal affect. Lymph:  Unremarkable  CBC    Component Value Date/Time   WBC 8.7 11/12/2021 0015   RBC 3.68 (L) 11/12/2021 0455   RBC 3.60 (L) 11/12/2021 0015   HGB 11.1 (L) 11/12/2021 0015   HCT 31.9 (L) 11/12/2021 0015   PLT 210 11/12/2021 0015   MCV 88.6 11/12/2021 0015   MCH 30.8 11/12/2021 0015    MCHC 34.8 11/12/2021 0015   RDW 11.8 11/12/2021 0015   LYMPHSABS 1.2 11/11/2021 1025   MONOABS 0.6 11/11/2021 1025   EOSABS 0.1 11/11/2021 1025   BASOSABS 0.0 11/11/2021 1025    BMET    Component Value Date/Time   NA 137 11/12/2021 0015   K 4.0 11/12/2021 0015   CL 105 11/12/2021 0015   CO2 23 11/12/2021 0015   GLUCOSE 235 (H) 11/12/2021 0015   BUN 31 (H) 11/12/2021 0015   CREATININE 1.29 (H) 11/12/2021 0015   CALCIUM 8.1 (L) 11/12/2021 0015   GFRNONAA 55 (L) 11/12/2021 0015   GFRAA >90 06/13/2012 0242    COAGS: Lab Results  Component Value Date   INR 1.1 11/11/2021     Non-Invasive Vascular Imaging:   ABI pending     ASSESSMENT/PLAN: This is a 81 y.o. male with tissue loss of right foot  -Mr. Wayne [name] is an 81 year old male with 1 week history of right foot tissue loss.  On exam he has absent pedal pulses however has bilateral 2+ femoral pulses.  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  ABI has been ordered.  He will likely require angiography and possible intervention on the right lower extremity.  Agree with IV antibiotics.  On-call vascular surgeon Dr. [name] will evaluate the patient later today and provide further treatment plans.   Emilie Rutter PA-C [facility] [phone]  VASCULAR STAFF ADDENDUM: I have independently interviewed and examined the patient. I agree with the above.  Right diabetic foot infection in 80yM with CAD, DM, HTN. No palpable pedal pulses; + femoral pulses. ABI pending.  Plan angiogram tomorrow. Keep NPO at midnight. Orders for consent written.  Rande Brunt. [name], MD [facility] Phone Number: [phone] 11/12/2021 8:40 AM ]

## 2021-11-13 ENCOUNTER — Encounter (HOSPITAL_COMMUNITY)
Admission: EM | Disposition: A | Discharge status: Home Health | Payer: Self-pay | Source: Home / Self Care | Attending: Family Medicine

## 2021-11-13 ENCOUNTER — Telehealth: Payer: Self-pay | Admitting: Vascular Surgery

## 2021-11-13 DIAGNOSIS — I70235 Atherosclerosis of native arteries of right leg with ulceration of other part of foot: Secondary | ICD-10-CM

## 2021-11-13 DIAGNOSIS — Z9861 Coronary angioplasty status: Secondary | ICD-10-CM | POA: Diagnosis not present

## 2021-11-13 DIAGNOSIS — E119 Type 2 diabetes mellitus without complications: Secondary | ICD-10-CM | POA: Diagnosis not present

## 2021-11-13 DIAGNOSIS — L03119 Cellulitis of unspecified part of limb: Secondary | ICD-10-CM | POA: Diagnosis not present

## 2021-11-13 DIAGNOSIS — I2510 Atherosclerotic heart disease of native coronary artery without angina pectoris: Secondary | ICD-10-CM | POA: Diagnosis not present

## 2021-11-13 DIAGNOSIS — L03115 Cellulitis of right lower limb: Secondary | ICD-10-CM | POA: Diagnosis not present

## 2021-11-13 HISTORY — PX: ABDOMINAL AORTOGRAM W/LOWER EXTREMITY: CATH118223

## 2021-11-13 LAB — LIPID PANEL
Cholesterol: 136 mg/dL (ref 0–200)
HDL: 26 mg/dL — ABNORMAL LOW (ref >40)
LDL Cholesterol: 94 mg/dL (ref 0–99)
Total CHOL/HDL Ratio: 5.2 RATIO
Triglycerides: 81 mg/dL (ref <150)
VLDL: 16 mg/dL (ref 0–40)

## 2021-11-13 LAB — CULTURE, BLOOD (ROUTINE X 2): Culture: NO GROWTH

## 2021-11-13 LAB — BASIC METABOLIC PANEL
Anion gap: 12 (ref 5–15)
BUN: 33 mg/dL — ABNORMAL HIGH (ref 8–23)
CO2: 17 mmol/L — ABNORMAL LOW (ref 22–32)
Calcium: 8.1 mg/dL — ABNORMAL LOW (ref 8.9–10.3)
Chloride: 106 mmol/L (ref 98–111)
Creatinine, Ser: 1.59 mg/dL — ABNORMAL HIGH (ref 0.61–1.24)
GFR, Estimated: 43 mL/min — ABNORMAL LOW (ref >60)
Glucose, Bld: 199 mg/dL — ABNORMAL HIGH (ref 70–99)
Potassium: 4.5 mmol/L (ref 3.5–5.1)
Sodium: 135 mmol/L (ref 135–145)

## 2021-11-13 LAB — VANCOMYCIN, TROUGH: Vancomycin Tr: 9 ug/mL — ABNORMAL LOW (ref 15–20)

## 2021-11-13 LAB — GLUCOSE, CAPILLARY
Glucose-Capillary: 182 mg/dL — ABNORMAL HIGH (ref 70–99)
Glucose-Capillary: 206 mg/dL — ABNORMAL HIGH (ref 70–99)
Glucose-Capillary: 267 mg/dL — ABNORMAL HIGH (ref 70–99)
Glucose-Capillary: 270 mg/dL — ABNORMAL HIGH (ref 70–99)

## 2021-11-13 SURGERY — ABDOMINAL AORTOGRAM W/LOWER EXTREMITY
Anesthesia: LOCAL | Laterality: Right

## 2021-11-13 MED ORDER — HEPARIN SODIUM (PORCINE) 5000 UNIT/ML IJ SOLN: 5000.0000 [IU] | Freq: Three times a day (TID) | INTRAMUSCULAR | Status: DC

## 2021-11-13 MED ORDER — LABETALOL HCL 5 MG/ML IV SOLN: 10.0000 mg | INTRAVENOUS | Status: DC | PRN

## 2021-11-13 MED ORDER — HEPARIN (PORCINE) IN NACL 1000-0.9 UT/500ML-% IV SOLN
INTRAVENOUS | Status: AC
  Filled 2021-11-13: qty 1000

## 2021-11-13 MED ORDER — SODIUM CHLORIDE 0.9 % IV SOLN
250.0000 mL | INTRAVENOUS | Status: DC | PRN
Start: 2021-11-13 — End: 2021-11-16

## 2021-11-13 MED ORDER — VANCOMYCIN HCL IN DEXTROSE 1-5 GM/200ML-% IV SOLN
1000.0000 mg | INTRAVENOUS | Status: DC
  Filled 2021-11-13: qty 200

## 2021-11-13 MED ORDER — LABETALOL HCL 5 MG/ML IV SOLN
INTRAVENOUS | Status: AC
  Filled 2021-11-13: qty 4

## 2021-11-13 MED ORDER — STERILE WATER FOR INJECTION IV SOLN
INTRAVENOUS | Status: DC
  Filled 2021-11-13: qty 1000
  Filled 2021-11-13: qty 150

## 2021-11-13 MED ORDER — LIDOCAINE HCL (PF) 1 % IJ SOLN
INTRAMUSCULAR | Status: DC | PRN
  Administered 2021-11-13: 15 mL

## 2021-11-13 MED ORDER — ATORVASTATIN CALCIUM 40 MG PO TABS
40.0000 mg | ORAL_TABLET | Freq: Every day | ORAL | Status: DC
  Administered 2021-11-13 – 2021-11-16 (×4): 40 mg via ORAL
  Filled 2021-11-13 (×5): qty 1

## 2021-11-13 MED ORDER — IODIXANOL 320 MG/ML IV SOLN
INTRAVENOUS | Status: DC | PRN
  Administered 2021-11-13: 100 mL

## 2021-11-13 MED ORDER — PROTAMINE SULFATE 10 MG/ML IV SOLN
INTRAVENOUS | Status: AC
  Filled 2021-11-13: qty 5

## 2021-11-13 MED ORDER — ACETAMINOPHEN 325 MG PO TABS: 650.0000 mg | ORAL_TABLET | ORAL | Status: DC | PRN

## 2021-11-13 MED ORDER — ASPIRIN 81 MG PO TBEC
81.0000 mg | DELAYED_RELEASE_TABLET | Freq: Every day | ORAL | Status: DC
  Administered 2021-11-13 – 2021-11-16 (×4): 81 mg via ORAL
  Filled 2021-11-13 (×4): qty 1

## 2021-11-13 MED ORDER — HYDRALAZINE HCL 20 MG/ML IJ SOLN: 5.0000 mg | INTRAMUSCULAR | Status: DC | PRN

## 2021-11-13 MED ORDER — SODIUM CHLORIDE 0.9% FLUSH
3.0000 mL | INTRAVENOUS | Status: DC | PRN
Start: 2021-11-13 — End: 2021-11-16

## 2021-11-13 MED ORDER — ONDANSETRON HCL 4 MG/2ML IJ SOLN: 4.0000 mg | Freq: Four times a day (QID) | INTRAMUSCULAR | Status: DC | PRN

## 2021-11-13 MED ORDER — HEPARIN SODIUM (PORCINE) 1000 UNIT/ML IJ SOLN
INTRAMUSCULAR | Status: DC | PRN
  Administered 2021-11-13: 8000 [IU] via INTRAVENOUS

## 2021-11-13 MED ORDER — SODIUM CHLORIDE 0.9 % WEIGHT BASED INFUSION: 1.0000 mL/kg/h | INTRAVENOUS | Status: DC

## 2021-11-13 MED ORDER — LIVING WELL WITH DIABETES BOOK
Freq: Once | Status: AC
Start: 2021-11-13 — End: 2021-11-13
  Filled 2021-11-13 (×2): qty 1

## 2021-11-13 MED ORDER — LIDOCAINE HCL (PF) 1 % IJ SOLN
INTRAMUSCULAR | Status: AC
  Filled 2021-11-13: qty 30

## 2021-11-13 MED ORDER — PROTAMINE SULFATE 10 MG/ML IV SOLN
INTRAVENOUS | Status: DC | PRN
  Administered 2021-11-13: 5 mg via INTRAVENOUS
  Administered 2021-11-13: 20 mg via INTRAVENOUS

## 2021-11-13 MED ORDER — HEPARIN SODIUM (PORCINE) 1000 UNIT/ML IJ SOLN
INTRAMUSCULAR | Status: AC
  Filled 2021-11-13: qty 10

## 2021-11-13 MED ORDER — SODIUM CHLORIDE 0.9% FLUSH
3.0000 mL | Freq: Two times a day (BID) | INTRAVENOUS | Status: DC
  Administered 2021-11-14 – 2021-11-15 (×4): 3 mL via INTRAVENOUS

## 2021-11-13 MED ORDER — HEPARIN (PORCINE) IN NACL 1000-0.9 UT/500ML-% IV SOLN
INTRAVENOUS | Status: DC | PRN
  Administered 2021-11-13 (×2): 500 mL

## 2021-11-13 MED ORDER — LABETALOL HCL 5 MG/ML IV SOLN
INTRAVENOUS | Status: DC | PRN
  Administered 2021-11-13: 10 mg via INTRAVENOUS

## 2021-11-13 SURGICAL SUPPLY — 17 items
BAG SNAP BAND KOVER 36X36 (MISCELLANEOUS) IMPLANT
CATH OMNI FLUSH 5F 65CM (CATHETERS) IMPLANT
DEVICE CLOSURE MYNXGRIP 6/7F (Vascular Products) IMPLANT
GLIDEWIRE ADV .035X260CM (WIRE) IMPLANT
KIT ENCORE 26 ADVANTAGE (KITS) IMPLANT
KIT MICROPUNCTURE NIT STIFF (SHEATH) IMPLANT
KIT PV (KITS) ×1 IMPLANT
SHEATH PINNACLE 5F 10CM (SHEATH) IMPLANT
SHEATH PINNACLE 6F 10CM (SHEATH) IMPLANT
SHEATH PINNACLE ST 6F 45CM (SHEATH) IMPLANT
SYR MEDRAD MARK V 150ML (SYRINGE) IMPLANT
TRANSDUCER W/STOPCOCK (MISCELLANEOUS) ×1 IMPLANT
TRAY PV CATH (CUSTOM PROCEDURE TRAY) ×1 IMPLANT
WIRE BENTSON .035X145CM (WIRE) IMPLANT
WIRE G V18X300CM (WIRE) IMPLANT
WIRE SHEPHERD 12G .014 (WIRE) IMPLANT
WIRE SPARTACORE .014X300CM (WIRE) IMPLANT

## 2021-11-13 NOTE — Progress Notes (Signed)
Subjective: Underwent angio today has inline flow to the ankle, most vascular standpoint.  States he is feeling well today.  No fevers or chills.  No significant pain he reports.  No other concerns today.  Objective: NAD Erythema to the dorsal aspect of foot appears to be improved compared to yesterday.  There is still some dusky changes present the second toe but this has been proving as well.  Old blister noted the second toe.  There is no fluctuation crepitation there is no drainage or pus. No pain with calf compression, swelling, warmth, erythema  Assessment: PAD with resolving cellulitis; second toe fracture right foot  Plan: At this point continue local wound care and broad-spectrum antibiotics.  I will order surgical shoe for immobilization given the fracture in the right foot.  He is weightbearing as tolerated. Podiatry will continue to follow for now.   Ovid Curd, DPM

## 2021-11-13 NOTE — Telephone Encounter (Signed)
-----   Message from Leonie Douglas, MD sent at 11/13/2021 10:03 AM EDT ----- [name] 11/13/2021 Procedure: 1) Ultrasound guided left common femoral artery access 2) Aortogram 3) Right lower extremity angiogram with third order cannulation ( total contrast) Assistant: none Follow up: 4 weeks with me Studies for follow up: ABI  Thank you! Elijah Birk

## 2021-11-13 NOTE — Progress Notes (Signed)
[facility] [place] PROGRESS NOTE  ASSESSMENT / PLAN: Wayne [name] is a 81 y.o. male with right diabetic foot infection. Toe pressure 49. Wifi score 1 / 1 / 2. Clinical stage 3. Moderate amputation risk. Moderate benefit from revascularization. Plan angiography today.  SUBJECTIVE: No complaints. Ready for angiogram.  OBJECTIVE: BP (!) 166/82 (BP Location: Right Arm)   Pulse 83   Temp 97.8 F (36.6 C) (Oral)   Resp 20   Ht 6\' 1"  (1.854 m)   Wt 81.7 kg   SpO2 93%   BMI 23.76 kg/m   Intake/Output Summary (Last 24 hours) at 11/13/2021 0852 Last data filed at 11/13/2021 0500 Gross per 24 hour  Intake 1192.12 ml  Output 775 ml  Net 417.12 ml    Elderly gentleman in no distress Regular rate and rhythm Unlabored breathing Right foot unchanged     Latest Ref Rng & Units 11/12/2021   12:15 AM 11/11/2021    9:55 PM 11/11/2021   10:25 AM  CBC  WBC 4.0 - 10.5 K/uL 8.7  8.4  8.8   Hemoglobin 13.0 - 17.0 g/dL 11/13/2021  75.1  02.5   Hematocrit 39.0 - 52.0 % 31.9  30.4  36.5   Platelets 150 - 400 K/uL 210  201  233         Latest Ref Rng & Units 11/13/2021    5:50 AM 11/12/2021   12:15 AM 11/11/2021    9:55 PM  CMP  Glucose 70 - 99 mg/dL 11/13/2021  778    BUN 8 - 23 mg/dL 33  31    Creatinine 242 - 1.24 mg/dL 3.53  6.14  4.31   Sodium 135 - 145 mmol/L 135  137    Potassium 3.5 - 5.1 mmol/L 4.5  4.0    Chloride 98 - 111 mmol/L 106  105    CO2 22 - 32 mmol/L 17  23    Calcium 8.9 - 10.3 mg/dL 8.1  8.1      Estimated Creatinine Clearance: 40.5 mL/min (A) (by C-G formula based on SCr of 1.59 mg/dL (H)).  5.40. Rande Brunt, MD [facility] Phone Number: [phone] 11/13/2021 8:52 AM

## 2021-11-13 NOTE — Progress Notes (Signed)
Progress Note  Patient: Wayne [name] [id number] DOB: [birth year]  DOA: 11/11/2021  DOS: 11/13/2021    Brief hospital course: Wayne [name] is an 81 y.o. male with a history of CAD s/p stenting, HTN, and T2DM who has not been on medications and presented to the ED from urgent care from home on 8/16 due to increasing redness, swelling, and blister formation over the right foot and 2nd toe for the past 5 days. Urgent care referred to ED due to concern for arterial occlusive disease. He was afebrile with dusky discoloration and ruptured blister on dorsum of right 2nd toe. XR did not show an acute bony abnormality. WBC 8.4k, lactic acid 3.0. Antibiotics initiated, ABI ordered and vascular surgery consulted. MRI of the foot revealed a nondisplaced proximal 2nd phalanx fracture, likely subacute with edema without abscess or osteomyelitis. Podiatry is also consulted. Angiogram 8/18.   Assessment and Plan: Right foot cellulitis, diabetic foot infection: No abscess or osteomyelitis, no leukocytosis.  - DC vancomycin given renal impairment, no purulence on exam. Continue cefepime for now, likely to deescalate to ancef if continues to show improvement. Remains at high risk of treatment failure with PAD not amenable to intervention and untreated diabetes.  - Will monitor blood cultures from 8/16, NG2D currently  Right 2nd phalanx fracture: Likely subacute. 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  - Podiatry consult is much appreciated.   PAD: Abnormal TBI's bilaterally with noncompressible ABIs, known CAD.  - Vascular surgery consulted, planning angiogram 8/18.  - LDL 94, HDL 26 consistent with dyslipidemia. Started high intensity statin, will need recheck LFTs and lipid panel in ~8 weeks.  - Add aspirin  T2DM, uncontrolled/untreated with hyperglycemia: HbA1c 9.4%.  - Continue SSI, glargine is ordered. - PCP follow up appointment has been made for patient to  reinitiate regular follow up. Based on encounters by myself and other staff, I am concerned that left to his own devices, this patient is unlikely to manage his diabetes effectively after discharge. We will involve his family in care.   Vitamin B12 deficiency: May be contributing to AMS, though this is unclear. - Supplement  Anemia of chronic disease: Folic acid replete, Ferritin 477, iron 22, 12% saturation of a TIBC of only 188.  - B12 supp as above - Monitor  Lactic acidosis: Cleared with IV fluids. There has been no fever or hemodynamic evidence of sepsis. Sepsis currently is felt to be ruled out. No leukocytosis, though significant elevation of inflammatory markers noted.   Confusion: More chronic, possible dementia. CT head without acute intracranial abnormality. There is advanced calcific atherosclerotic disease. Ammonia, RPR negative. 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  - Renal function a bit worse and god dye with angio. Vanc level subtherapeutic and cellulitis is improving anyway, will DC vanc, gently hydrate overnight with bicarb (bicarb down to 17) overnight and recheck  Subjective: No new complaints, no fever.  Objective: Vitals:   11/13/21 1230 11/13/21 1235 11/13/21 1245 11/13/21 1325  BP:  (!) 168/72  (!) 153/77  Pulse: 70 70 71 81  Resp: (!) 25 19 16 19   Temp:    97.9 F (36.6 C)  TempSrc:    Oral  SpO2: 93% 96% 95% 96%  Weight:    85.5 kg  Height:    6' (1.829 m)   Gen: 81 y.o. male in no distress Pulm: Nonlabored breathing room air. Clear. CV: Regular rate and rhythm. No  murmur, rub, or gallop. No JVD, no pitting dependent edema. GI: Abdomen soft, non-tender, non-distended, with normoactive bowel sounds.  Ext: Warm, no deformities Skin: Improved appearance of right foot and toe, though still no palpable DP pulse and cap refill sluggish.  Neuro: Alert, following commands. No focal neurological  deficits. Psych: Judgement and insight appear marginal. Mood euthymic & affect congruent. Behavior is appropriate.       Data Personally reviewed: CBC: Recent Labs  Lab 11/11/21 1025 11/11/21 2155 11/12/21 0015  WBC 8.8 8.4 8.7  NEUTROABS 6.8  --   --   HGB 12.5* 10.6* 11.1*  HCT 36.5* 30.4* 31.9*  MCV 88.4 86.6 88.6  PLT 233 201 [id number]   Basic Metabolic Panel: Recent Labs  Lab 11/11/21 1025 11/11/21 2155 11/12/21 0015 11/13/21 0550  NA 135  --  137 135  K 4.3  --  4.0 4.5  CL 98  --  105 106  CO2 25  --  23 17*  GLUCOSE 311*  --  235* 199*  BUN 43*  --  31* 33*  CREATININE 1.48* 1.36* 1.29* 1.59*  CALCIUM 9.5  --  8.1* 8.1*   GFR: Estimated Creatinine Clearance: 39.3 mL/min (A) (by C-G formula based on SCr of 1.59 mg/dL (H)). Liver Function Tests: Recent Labs  Lab 11/11/21 1025  AST 14*  ALT 19  ALKPHOS 194*  BILITOT 1.2  PROT 8.0  ALBUMIN 4.0   No results for input(s): "LIPASE", "AMYLASE" in the last 168 hours. Recent Labs  Lab 11/12/21 0455  AMMONIA 11   Coagulation Profile: Recent Labs  Lab 11/11/21 1025  INR 1.1   Cardiac Enzymes: No results for input(s): "CKTOTAL", "CKMB", "CKMBINDEX", "TROPONINI" in the last 168 hours. BNP (last 3 results) No results for input(s): "PROBNP" in the last 8760 hours. HbA1C: Recent Labs    11/12/21 0015  HGBA1C 9.4*   CBG: Recent Labs  Lab 11/12/21 1143 11/12/21 1628 11/12/21 2200 11/13/21 0813 11/13/21 1033  GLUCAP 170* 171* 137* 182* 206*   Lipid Profile: Recent Labs    11/13/21 0550  CHOL 136  HDL 26*  LDLCALC 94  TRIG 81  CHOLHDL 5.2   Thyroid Function Tests: Recent Labs    11/12/21 0455  TSH 2.113   Anemia Panel: Recent Labs    11/12/21 0455  VITAMINB12 168*  FOLATE 16.2  FERRITIN 477*  TIBC 188*  IRON 22*  RETICCTPCT 1.5   Urine analysis:    Component Value Date/Time   COLORURINE YELLOW 11/11/2021 1239   APPEARANCEUR CLEAR 11/11/2021 1239   LABSPEC 1.018 11/11/2021  1239   PHURINE 6.0 11/11/2021 1239   GLUCOSEU >1,000 (A) 11/11/2021 1239   HGBUR NEGATIVE 11/11/2021 1239   BILIRUBINUR NEGATIVE 11/11/2021 1239   KETONESUR NEGATIVE 11/11/2021 1239   PROTEINUR TRACE (A) 11/11/2021 1239   UROBILINOGEN 0.2 06/13/2012 0325   NITRITE NEGATIVE 11/11/2021 1239   LEUKOCYTESUR NEGATIVE 11/11/2021 1239   Recent Results (from the past 240 hour(s))  Culture, blood (Routine x 2)     Status: None (Preliminary result)   Collection Time: 11/11/21 10:25 AM   Specimen: Left Antecubital; Blood  Result Value Ref Range Status   Specimen Description   Final    LEFT ANTECUBITAL BLOOD Performed at [facility], [place] [address], [place], [place] [address]    Special Requests   Final    BOTTLES DRAWN AEROBIC AND ANAEROBIC Blood Culture results may not be optimal due to an excessive volume of blood received  in culture bottles Performed at [facility], [address] [place], [place] [address]    Culture   Final    NO GROWTH 2 DAYS Performed at [facility], [place] [address] [place], [place] [address]    Report Status PENDING  Incomplete  Culture, blood (Routine x 2)     Status: None (Preliminary result)   Collection Time: 11/11/21 10:25 AM   Specimen: Right Antecubital; Blood  Result Value Ref Range Status   Specimen Description   Final    RIGHT ANTECUBITAL BLOOD Performed at [facility], [place] [address], [place], [place] [address]    Special Requests   Final    BOTTLES DRAWN AEROBIC AND ANAEROBIC Blood Culture adequate volume Performed at [facility], [address] [place], [place] [address]    Culture   Final    NO GROWTH 2 DAYS Performed at [facility], [place] [address], [name], [place] [address]    Report Status PENDING  Incomplete     PERIPHERAL VASCULAR CATHETERIZATION  Result Date: 11/13/2021 DATE OF SERVICE: 11/13/2021  PATIENT:  [name]  81 y.o. 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 Stanford Breed, MD  ASSISTANT: none  ANESTHESIA:   local  ESTIMATED BLOOD LOSS: minimal  LOCAL MEDICATIONS USED:  LIDOCAINE  COUNTS: confirmed correct.  PATIENT DISPOSITION:  PACU - hemodynamically stable.  Delay start of Pharmacological VTE agent (>24hrs) due to surgical blood loss or risk of bleeding: no  INDICATION FOR PROCEDURE: Kosta Paulman is a 81 y.o. male with right foot ulcer with peripheral arterial disease. After careful discussion of risks, benefits, and alternatives the patient was offered angiography. The patient understood and wished to proceed.  OPERATIVE FINDINGS: Terminal aorta and iliac arteries: Widely patent without flow-limiting stenosis  Right lower extremity: Common femoral artery: Widely patent without flow-limiting stenosis Profunda femoris artery: Widely patent without flow-limiting stenosis Superficial femoral artery: Diffusely diseased.  No stenosis greater than 50%. Popliteal artery: Diffusely diseased.  Widely patent. Anterior tibial artery: Occluded Tibioperoneal trunk: Widely patent Peroneal artery: Widely patent to the ankle.  Arborization to the PT, filling the foot. Posterior tibial artery: Patent at its origin.  Patent about the ankle.  Fills the pedal circulation. Pedal circulation: Disadvantaged.  Fills only through PT.  Small vessel disease noted.  GLASS score.  Not applicable.  Inline flow to the ankle.  Wifi score 1 / 1 / 2.  Clinical stage III.  DESCRIPTION OF PROCEDURE: After identification of the patient in the pre-operative holding area, the patient was transferred to the operating room. The patient was positioned supine on the operating room table. The groins were  prepped and draped in standard fashion. A surgical pause was performed confirming correct patient, procedure, and operative location.  The left groin was anesthetized with subcutaneous injection of 1% lidocaine. Using ultrasound guidance, the left common femoral artery was accessed with micropuncture technique. Fluoroscopy was used to confirm cannulation over the femoral head. The 28F sheath was upsized to 57F.  A Benson wire was advanced into the distal aorta. Over the wire an omni flush catheter was advanced to the level of L2. Aortogram was performed - see above for details.  The  right common iliac artery was selected with an omniflush catheter and Bentson guidewire. The wire was advanced into the common femoral artery. Over the wire the omni flush catheter was advanced into the external iliac artery. Selective angiography was performed - see above for details.  The decision was made to intervene. The patient was heparinized with 8000 units of heparin. The 49F sheath was exchanged for a 20F x 45cm sheath. Selective angiography of the left lower extremity was performed prior to intervention.  I tried to recanalize the posterior tibial artery with a variety of wire and catheter exchanges.  I was unable to cross the lesion antegrade.  I elected to stop here.  All endovascular equipment was then removed.  A minx device was used to close the arteriotomy. Hemostasis was excellent upon completion.  Upon completion of the case instrument and sharps counts were confirmed correct. The patient was transferred to the PACU in good condition. I was present for all portions of the procedure.  PLAN: ASA 81mg  PO QD. High intensity statin therapy.  Inline flow to the ankle.  Patient optimized from a vascular standpoint.  Would recommend local care to the toe only.  Would not recommend amputation at this time.  We will follow closely clinically.  If he has deterioration, we could consider retrograde posterior tibial access.  .  Rande Brunt, MD [facility] Phone Number: [phone] 11/13/2021 9:53 AM   VAS 11/15/2021 ABI WITH/WO TBI  Result Date: 11/12/2021  LOWER EXTREMITY DOPPLER STUDY Patient Name:  JORDIE SCHREUR  Date of Exam:   11/12/2021 Medical Rec #: 11/14/2021          Accession #:    [id number] Date of Birth: 1941-02-11         Patient Gender: M Patient Age:   65 years Exam Location:  [facility] Procedure:      VAS [facility] ABI WITH/WO TBI Referring Phys: [place] --------------------------------------------------------------------------------  Indications: Pain in right toe. High Risk Factors: Diabetes.  Comparison Study: No previous exam noted. Performing Technologist: Midge Minium BS, RVT  Examination Guidelines: A complete evaluation includes at minimum, Doppler waveform signals and systolic blood pressure reading at the level of bilateral brachial, anterior tibial, and posterior tibial arteries, when vessel segments are accessible. Bilateral testing is considered an integral part of a complete examination. Photoelectric Plethysmograph (PPG) waveforms and toe systolic pressure readings are included as required and additional duplex testing as needed. Limited examinations for reoccurring indications may be performed as noted.  ABI Findings: +---------+------------------+-----+-------------------+--------+ Right    Rt Pressure (mmHg)IndexWaveform           Comment  +---------+------------------+-----+-------------------+--------+ Brachial 157                    triphasic                   +---------+------------------+-----+-------------------+--------+ PTA      255               1.60 dampened monophasic         +---------+------------------+-----+-------------------+--------+ DP       103               0.65 dampened monophasic         +---------+------------------+-----+-------------------+--------+ Great Toe49                0.31                              +---------+------------------+-----+-------------------+--------+ +---------+------------------+-----+--------------------+-------+  Left     Lt Pressure (mmHg)IndexWaveform            Comment +---------+------------------+-----+--------------------+-------+ Brachial 159                    triphasic                   +---------+------------------+-----+--------------------+-------+ PTA      255               1.60 dampened monophasic         +---------+------------------+-----+--------------------+-------+ DP       255               1.60 Hyperemic/monophasic        +---------+------------------+-----+--------------------+-------+ Great Toe66                0.42                             +---------+------------------+-----+--------------------+-------+ +-------+-----------+-----------+------------+------------+ ABI/TBIToday's ABIToday's TBIPrevious ABIPrevious TBI +-------+-----------+-----------+------------+------------+ Right  [place]         0.31                                +-------+-----------+-----------+------------+------------+ Left   [place]         0.42                                +-------+-----------+-----------+------------+------------+  Summary: Right: Resting right ankle-brachial index indicates noncompressible right lower extremity arteries. The right toe-brachial index is abnormal. Left: Resting left ankle-brachial index indicates noncompressible left lower extremity arteries. The left toe-brachial index is abnormal. *See table(s) above for measurements and observations.  Electronically signed by Monica [name] MD on 11/12/2021 at 3:13:14 PM.    Final    MR FOOT RIGHT WO CONTRAST  Result Date: 11/12/2021 CLINICAL DATA:  Foot swelling, nondiabetic, osteomyelitis suspected EXAM: MRI OF THE RIGHT FOREFOOT WITHOUT CONTRAST TECHNIQUE: Multiplanar, multisequence MR imaging of the right forefoot was performed. No intravenous contrast was  administered. COMPARISON:  Right foot radiograph 11/11/2021 FINDINGS: Bones/Joint/Cartilage Nondisplaced fracture at the base of the second toe proximal phalanx, which may be subacute. Minimal marrow edema in the proximal phalanx. No other significant marrow signal alteration. Ligaments Intact Lisfranc ligament.  Intact MTP collateral ligaments. Muscles and Tendons No acute tendon tear. Diffuse intramuscular edema and atrophy in the foot as is commonly seen in diabetics. Soft tissues Generalized soft tissue swelling of the foot, with confluent edema along the second toe (series 4 images 8-15). No focal fluid collection. There is an adjacent blister like skin lesion. IMPRESSION: Nondisplaced fracture at base of the second toe proximal phalanx, which may be subacute. Adjacent confluent edema and a blister-like skin lesion noted along the plantar aspect of the second toe noted. No evidence of soft tissue abscess or osteomyelitis. Electronically Signed   By: Maurine Simmering M.D.   On: 11/12/2021 10:05   CT HEAD WO CONTRAST (5MM)  Result Date: 11/12/2021 CLINICAL DATA:  81 year old male with altered mental status. EXAM: CT HEAD WITHOUT CONTRAST TECHNIQUE: Contiguous axial images were obtained from the base of the skull through the vertex without intravenous contrast. RADIATION DOSE REDUCTION: This exam was performed according to the departmental dose-optimization program which includes automated exposure control, adjustment of the mA and/or kV according to patient size and/or use  of iterative reconstruction technique. COMPARISON:  CT cervical spine 02/27/2009. FINDINGS: Brain: Cerebral volume loss appears fairly generalized. No midline shift, ventriculomegaly, mass effect, evidence of mass lesion, intracranial hemorrhage or evidence of cortically based acute infarction. Gray-white matter differentiation is within normal limits for age in both hemispheres. No cortical encephalomalacia identified. Vascular: Extensive  Calcified atherosclerosis at the skull base. No suspicious intracranial vascular hyperdensity. Skull: No acute osseous abnormality identified. Sinuses/Orbits: Visualized paranasal sinuses and mastoids are well aerated. Other: No acute orbit or scalp soft tissue finding. Fairly extensive calcified scalp vessel atherosclerosis. IMPRESSION: 1. No acute intracranial abnormality. Unremarkable for age noncontrast CT appearance of the brain. 2. Advanced calcified atherosclerosis, including involvement of scalp vessels. This appearance can be seen with End stage renal disease. Electronically Signed   By: Odessa Fleming M.D.   On: 11/12/2021 05:49    Family Communication: None at bedside this AM or PM  Disposition: Status is: Inpatient Remains inpatient appropriate because: Continue IV antibiotics for DFI with ongoing risk factors for poor wound healing. Planned Discharge Destination: TBD, PT/OT in AM.  Tyrone Nine, MD 11/13/2021 4:11 PM Page by [email or web address]

## 2021-11-13 NOTE — Progress Notes (Signed)
Pharmacy Antibiotic Note  Wayne [name] is a 81 y.o. male admitted on 11/11/2021 with sepsis and cellulitis.  Pharmacy has been consulted for vancomycin and cefepime dosing.   WBC WNL on last check. Scr trending up to 1.59 (CrCl 39 mL/min). Vancomycin trough today prior to second dose of 1250 mg (of note received 1g IV vanc on 8/16) came back at 9.   Plan: Cefepime 2gm IV Q12H Reduce Vanc to 1000 mg IV Q24H starting on 8/18@1600  F/u renal fxn, C&S, clinical status and peak/trough at SS  Height: 6' (182.9 cm) Weight: 85.5 kg (188 lb 6.4 oz) IBW/kg (Calculated) : 77.6  Temp (24hrs), Avg:98 F (36.7 C), Min:97.8 F (36.6 C), Max:98.3 F (36.8 C)  Recent Labs  Lab 11/11/21 1025 11/11/21 2009 11/11/21 2155 11/12/21 0015 11/12/21 0247 11/13/21 0550 11/13/21 1337  WBC 8.8  --  8.4 8.7  --   --   --   CREATININE 1.48*  --  1.36* 1.29*  --  1.59*  --   LATICACIDVEN 1.2 3.0*  --  2.4* 1.7  --   --   VANCOTROUGH  --   --   --   --   --   --  9*     Estimated Creatinine Clearance: 39.3 mL/min (A) (by C-G formula based on SCr of 1.59 mg/dL (H)).    No Known Allergies  Antimicrobials this admission: Vanc 8/16>> Cefepime 8/16>> Flagyl x 1 8/16  Dose adjustments this admission: VT 9 on 8/18: reduce vanc to 1g IV every 24 hours   Microbiology results: Bcx 8/16: ngtd   Thank you for allowing pharmacy to be a part of this patient's care.  Sherron Monday, PharmD, BCCCP Clinical Pharmacist  Phone: [phone] 11/13/2021 3:38 PM  Please check AMION for all [facility] Pharmacy phone numbers After 10:00 PM, call Main Pharmacy [phone]

## 2021-11-13 NOTE — Progress Notes (Signed)
PHARMACIST LIPID MONITORING   Wayne [name] is a 81 y.o. male admitted on 11/11/2021 with RLE angiogram.  Pharmacy has been consulted to optimize lipid-lowering therapy with the indication of secondary prevention for clinical ASCVD.  Recent Labs:  Lipid Panel (last 6 months):   Lab Results  Component Value Date   CHOL 136 11/13/2021   TRIG 81 11/13/2021   HDL 26 (L) 11/13/2021   CHOLHDL 5.2 11/13/2021   VLDL 16 11/13/2021   LDLCALC 94 11/13/2021    Hepatic function panel (last 6 months):   Lab Results  Component Value Date   AST 14 (L) 11/11/2021   ALT 19 11/11/2021   ALKPHOS 194 (H) 11/11/2021   BILITOT 1.2 11/11/2021    SCr (since admission):   Serum creatinine: 1.59 mg/dL (H) 87/86/76 7209 Estimated creatinine clearance: 39.3 mL/min (A)  Current therapy and lipid therapy tolerance Current lipid-lowering therapy: atorvastatin 40 mg Previous lipid-lowering therapies (if applicable): None Documented or reported allergies or intolerances to lipid-lowering therapies (if applicable): None  Assessment:   Patient was started on atorvastatin 40 mg this admission - was not on statin PTA. Will continue with current order.  Plan:    1.Statin intensity (high intensity recommended for all patients regardless of the LDL):  Continue currently ordered high intensity  statin.  2.Add ezetimibe (if any one of the following):   Not indicated at this time.  3.Refer to lipid clinic:   No  4.Follow-up with:  Vascular office (no PCP listed on file)  5.Follow-up labs after discharge:  Changes in lipid therapy were made. Check a lipid panel in 8-12 weeks then annually.      Sherron Monday, PharmD, BCCCP Clinical Pharmacist  Phone: [phone] 11/13/2021 2:09 PM  Please check AMION for all [facility] Pharmacy phone numbers After 10:00 PM, call Main Pharmacy [phone]

## 2021-11-13 NOTE — Op Note (Signed)
DATE OF SERVICE: 11/13/2021  PATIENT:  Wayne [name]  81 y.o. 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 [name], MD  ASSISTANT: none  ANESTHESIA:   local  ESTIMATED BLOOD LOSS: minimal  LOCAL MEDICATIONS USED:  LIDOCAINE   COUNTS: confirmed correct.  PATIENT DISPOSITION:  PACU - hemodynamically stable.   Delay start of Pharmacological VTE agent (>24hrs) due to surgical blood loss or risk of bleeding: no  INDICATION FOR PROCEDURE: Wayne [name] is a 81 y.o. male with right foot ulcer with peripheral arterial disease. After careful discussion of risks, benefits, and alternatives the patient was offered angiography. The patient understood and wished to proceed.  OPERATIVE FINDINGS:  Terminal aorta and iliac arteries: Widely patent without flow-limiting stenosis  Right lower extremity: Common femoral artery: Widely patent without flow-limiting stenosis  Profunda femoris artery: Widely patent without flow-limiting stenosis  Superficial femoral artery: Diffusely diseased.  No stenosis greater than 50%. Popliteal artery: Diffusely diseased.  Widely patent. Anterior tibial artery: Occluded Tibioperoneal trunk: Widely patent Peroneal artery: Widely patent to the ankle.  Arborization to the PT, filling the foot. Posterior tibial artery: Patent at its origin.  Patent about the ankle.  Fills the pedal circulation. Pedal circulation: Disadvantaged.  Fills only through PT.  Small vessel disease noted.  GLASS score.  Not applicable.  Inline flow to the ankle.  Wifi score 1 / 1 / 2.  Clinical stage III.  DESCRIPTION OF PROCEDURE: After identification of the patient in the  pre-operative holding area, the patient was transferred to the operating room. The patient was positioned supine on the operating room table. The groins were prepped and draped in standard fashion. A surgical pause was performed confirming correct patient, procedure, and operative location.  The left groin was anesthetized with subcutaneous injection of 1% lidocaine. Using ultrasound guidance, the left common femoral artery was accessed with micropuncture technique. Fluoroscopy was used to confirm cannulation over the femoral head. The 349F sheath was upsized to 49F.   A Benson wire was advanced into the distal aorta. Over the wire an omni flush catheter was advanced to the level of L2. Aortogram was performed - see above for details.  The right common iliac artery was selected with an omniflush catheter and Bentson guidewire. The wire was advanced into the common femoral artery. Over the wire the omni flush catheter was advanced into the external iliac artery. Selective angiography was performed - see above for details.   The decision was made to intervene. The patient was heparinized with 8000 units of heparin. The 49F sheath was exchanged for a 60F x 45cm sheath. Selective angiography of the left lower extremity was performed prior to intervention.   I tried to recanalize the posterior tibial artery with a variety of wire and catheter exchanges.  I was unable to cross the lesion antegrade.  I elected to stop here.  All endovascular equipment was then removed.  A minx device was used to close the arteriotomy. Hemostasis was excellent upon completion.  Upon completion of the case instrument and sharps counts were confirmed correct. The patient was transferred to the PACU in good condition. I was present for all portions of the procedure.  PLAN: ASA 81mg  PO QD. High intensity statin  therapy.  Inline flow to the ankle.  Patient optimized from a vascular standpoint.  Would recommend local care to the toe  only.  Would not recommend amputation at this time.  We will follow closely clinically.  If he has deterioration, we could consider retrograde posterior tibial access.  Rande Brunt. [name], MD [facility] Phone Number: [phone] 11/13/2021 9:53 AM

## 2021-11-13 NOTE — Inpatient Diabetes Management (Signed)
Inpatient Diabetes Program Recommendations  AACE/ADA: New Consensus Statement on Inpatient Glycemic Control (2015)  Target Ranges:  Prepandial:   less than 140 mg/dL      Peak postprandial:   less than 180 mg/dL (1-2 hours)      Critically ill patients:  140 - 180 mg/dL   Lab Results  Component Value Date   GLUCAP 206 (H) 11/13/2021   HGBA1C 9.4 (H) 11/12/2021    Review of Glycemic Control  Latest Reference Range & Units 11/11/21 20:37 11/12/21 07:57 11/12/21 11:43 11/12/21 16:28 11/12/21 22:00 11/13/21 08:13 11/13/21 10:33  Glucose-Capillary 70 - 99 mg/dL 662 (H) 947 (H) 654 (H) 171 (H) 137 (H) 182 (H) 206 (H)  (H): Data is abnormally high  Latest Reference Range & Units 11/12/21 00:15  Hemoglobin A1C 4.8 - 5.6 % 9.4 (H)  (H): Data is abnormally high  Diabetes history: DM2 Outpatient Diabetes medications: Metformin 500 mg QD (not taking) Current orders for Inpatient glycemic control: Semglee 5 units QD, Novolog 0-9 units TID  Spoke with patient at bedside.  Very pleasant 81 yo man.  He states he does not have diabetes.  He states he does not take any medications.  His significant other was concerned about his foot wound and had him come to the ED.    Reviewed patient's current A1c of 9.4% (average glucose of 223 mg/dL). Explained what a A1c is and what it measures. Also reviewed goal A1c with patient, importance of good glucose control @ home, and blood sugar goals.  Smiling, he still states he does not have diabetes.  He does not check his blood glucose at home nor does he have a glucometer.    He likes to drink a lot of juice.  Explained it will be best if he stays away from juices and sugary beverages and drinks water, diet drinks.    Not sure he is fully comprehending education.    Will reorder LWWD as I do not see it in the room since he transferred.    For DC please order:  Glucometer & supplies Order # [id number]  Would benefit from restarting Metformin at discharge  and close follow up with PCP.  Will continue to follow while inpatient.  Thank you, Dulce Sellar, MSN, CDCES Diabetes Coordinator Inpatient Diabetes Program [phone] (team pager from 8a-5p)

## 2021-11-14 ENCOUNTER — Telehealth: Payer: Self-pay | Admitting: Podiatry

## 2021-11-14 DIAGNOSIS — Z9861 Coronary angioplasty status: Secondary | ICD-10-CM | POA: Diagnosis not present

## 2021-11-14 DIAGNOSIS — I2510 Atherosclerotic heart disease of native coronary artery without angina pectoris: Secondary | ICD-10-CM | POA: Diagnosis not present

## 2021-11-14 DIAGNOSIS — E119 Type 2 diabetes mellitus without complications: Secondary | ICD-10-CM | POA: Diagnosis not present

## 2021-11-14 DIAGNOSIS — L03119 Cellulitis of unspecified part of limb: Secondary | ICD-10-CM | POA: Diagnosis not present

## 2021-11-14 DIAGNOSIS — L03115 Cellulitis of right lower limb: Secondary | ICD-10-CM | POA: Diagnosis not present

## 2021-11-14 LAB — BASIC METABOLIC PANEL
Anion gap: 11 (ref 5–15)
BUN: 43 mg/dL — ABNORMAL HIGH (ref 8–23)
CO2: 20 mmol/L — ABNORMAL LOW (ref 22–32)
Calcium: 7.9 mg/dL — ABNORMAL LOW (ref 8.9–10.3)
Chloride: 102 mmol/L (ref 98–111)
Creatinine, Ser: 1.51 mg/dL — ABNORMAL HIGH (ref 0.61–1.24)
GFR, Estimated: 46 mL/min — ABNORMAL LOW (ref >60)
Glucose, Bld: 351 mg/dL — ABNORMAL HIGH (ref 70–99)
Potassium: 4.4 mmol/L (ref 3.5–5.1)
Sodium: 133 mmol/L — ABNORMAL LOW (ref 135–145)

## 2021-11-14 LAB — GLUCOSE, CAPILLARY
Glucose-Capillary: 291 mg/dL — ABNORMAL HIGH (ref 70–99)
Glucose-Capillary: 186 mg/dL — ABNORMAL HIGH (ref 70–99)
Glucose-Capillary: 116 mg/dL — ABNORMAL HIGH (ref 70–99)

## 2021-11-14 MED ORDER — CYANOCOBALAMIN 1000 MCG/ML IJ SOLN
1000.0000 ug | Freq: Every day | INTRAMUSCULAR | Status: DC
  Administered 2021-11-15 – 2021-11-16 (×2): 1000 ug via INTRAMUSCULAR
  Filled 2021-11-14 (×2): qty 1

## 2021-11-14 MED ORDER — STERILE WATER FOR INJECTION IV SOLN
INTRAVENOUS | Status: DC
  Filled 2021-11-14: qty 1000

## 2021-11-14 MED ORDER — GLIPIZIDE 5 MG PO TABS
2.5000 mg | ORAL_TABLET | Freq: Every day | ORAL | Status: DC
  Administered 2021-11-15 – 2021-11-16 (×2): 2.5 mg via ORAL
  Filled 2021-11-14 (×2): qty 0.5
  Filled 2021-11-14: qty 1
  Filled 2021-11-14: qty 0.5

## 2021-11-14 MED ORDER — LINAGLIPTIN 5 MG PO TABS
5.0000 mg | ORAL_TABLET | Freq: Every day | ORAL | Status: DC
  Administered 2021-11-14 – 2021-11-16 (×3): 5 mg via ORAL
  Filled 2021-11-14 (×3): qty 1

## 2021-11-14 NOTE — Progress Notes (Addendum)
[facility] [place]  Subjective  - No new complaints   Objective (!) 181/88 82 (!) 97.3 F (36.3 C) (Oral) 20 92%  Intake/Output Summary (Last 24 hours) at 11/14/2021 0709 Last data filed at 11/14/2021 0556 Gross per 24 hour  Intake 1820.72 ml  Output 300 ml  Net 1520.72 ml    Left groin soft   Decreased erythema on foot dorsum    Assessment/Planning: Wayne [name] is a 81 y.o. male with right diabetic foot infection.  POD # 1  Angiogram patent CFA, and profunda artery. Superficial femoral artery: Diffusely diseased.  No stenosis greater than 50%. Popliteal artery: Diffusely diseased.  Widely patent. Anterior tibial artery: Occluded Tibioperoneal trunk: Widely patent Peroneal artery: Widely patent to the ankle.  Arborization to the PT, filling the foot. Posterior tibial artery: Patent at its origin.  Patent about the ankle.  Fills the pedal circulation. Pedal circulation: Disadvantaged.  Fills only through PT.  Small vessel disease noted.   Plan ASA daily.  Patient is optimized from a vascular point of view.  DR. Lenell [name] recommended local toe wound care without amputation.  He has limited options for further vascular interventions.  If the toe deteriorates Dr. Lenell [name] suggested post retrograde posterior tibial access.    Followed by DR. Wagoner  F/U with Dr. Lenell [name] in 1 month with ABI's.  [facility] will arrange f/u.  Wayne [name] 11/14/2021 7:09 AM --  Laboratory Lab Results: Recent Labs    11/11/21 2155 11/12/21 0015  WBC 8.4 8.7  HGB 10.6* 11.1*  HCT 30.4* 31.9*  PLT 201 210   BMET Recent Labs    11/13/21 0550 11/14/21 0151  NA 135 133*  K 4.5 4.4  CL 106 102  CO2 17* 20*  GLUCOSE 199* 351*  BUN 33* 43*  CREATININE 1.59* 1.51*  CALCIUM 8.1* 7.9*    COAG Lab Results  Component Value Date   INR 1.1 11/11/2021   No results found for: "PTT"  VASCULAR STAFF ADDENDUM: I have independently interviewed and  examined the patient. I agree with the above.  Small vessel disease in foot. Optimized from vascular standpoint. Needs best medical therapy for PAD and better control of DM. Recommend podiatry for outpatient high risk foot care. Follow up with me in 1 month. Please call for questions.  Wayne Brunt. [name], MD [facility] Phone Number: [phone] 11/14/2021 4:19 PM

## 2021-11-14 NOTE — Telephone Encounter (Signed)
Can you please schedule a post-hospital discharge follow-up with me in the office for 1 week? He did not have surgery but it is for a fracture of the 2nd toe, cellulitis right foot. Thank you.

## 2021-11-14 NOTE — Evaluation (Signed)
Physical Therapy Evaluation Patient Details Name: Wayne [name] MRN: [id number] DOB: [birth year] Today's Date: 11/14/2021  History of Present Illness  81 y/o male presented to ED on 11/11/21 for swelling and ulcer on R foot x 5 days as well as AMS. MRI R foot showed fracture at base of 2nd toe proximal phalanx. S/p R LE angiogram on 8/18. PMH: CAD s/p stenting, T2DM, HTN  Clinical Impression  Patient admitted with the above. PTA, patient lives alone and was independent with mobility with no AD. 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 Patient presents with weakness, impaired balance, and decreased activity tolerance. Patient ambulated in hallway with HHAx1, IV pole, and minA for balance as patient staggering initially. Would benefit from use of RW for stability during mobility but one not present at evaluation. Patient will benefit from skilled PT services during acute stay to address listed deficits. Recommend HHPT and frequent supervision/assistance at discharge to maximize functional independence and safety in the home.        Recommendations for follow up therapy are one component of a multi-disciplinary discharge planning process, led by the attending physician.  Recommendations may be updated based on patient status, additional functional criteria and insurance authorization.  Follow Up Recommendations Home health PT      Assistance Recommended at Discharge Frequent or constant Supervision/Assistance  Patient can return home with the following  A little help with walking and/or transfers;A little help with bathing/dressing/bathroom;Assistance with cooking/housework;Direct supervision/assist for medications management;Direct supervision/assist for financial management;Assist for transportation;Help with stairs or ramp for entrance    Equipment Recommendations Rolling Kataya Guimont (2 wheels);BSC/3in1  Recommendations for Other Services        Functional Status Assessment Patient has had a recent decline in their functional status and demonstrates the ability to make significant improvements in function in a reasonable and predictable amount of time.     Precautions / Restrictions Precautions Precautions: Fall Required Braces or Orthoses: Other Brace Other Brace: post op shoe Restrictions Weight Bearing Restrictions: Yes RLE Weight Bearing: Weight bearing as tolerated Other Position/Activity Restrictions: in post op shoe      Mobility  Bed Mobility Overal bed mobility: Modified Independent                  Transfers Overall transfer level: Needs assistance Equipment used: None Transfers: Sit to/from Stand Sit to Stand: Min assist           General transfer comment: assist to stand and steady    Ambulation/Gait Ambulation/Gait assistance: Min assist Gait Distance (Feet): 150 Feet Assistive device: IV Pole, 1 person hand held assist Gait Pattern/deviations: Step-through pattern, Decreased stride length, Staggering right, Staggering left, Drifts right/left, Wide base of support Gait velocity: decreased     General Gait Details: staggering L/R during mobility requiring minA with using HHAx1 and IV pole.  Stairs            Wheelchair Mobility    Modified Rankin (Stroke Patients Only)       Balance Overall balance assessment: Needs assistance Sitting-balance support: No upper extremity supported, Feet supported Sitting balance-Leahy Scale: Good     Standing balance support: Bilateral upper extremity supported, During functional activity Standing balance-Leahy Scale: Poor                               Pertinent Vitals/Pain Pain Assessment Pain Assessment: No/denies pain    Home Living Family/patient expects  to be discharged to:: Private residence Living Arrangements: Alone Available Help at Discharge: Friend(s);Family;Available PRN/intermittently Type of Home:  House Home Access: Stairs to enter   Entrance Stairs-Number of Steps: 10 Alternate Level Stairs-Number of Steps: 10 Home Layout: Two level Home Equipment: None Additional Comments: Daughter states they can for sure have someone check on him during the day but unsure about staying the night at this time    Prior Function Prior Level of Function : Independent/Modified Independent             Mobility Comments: x1 fall in past month       Hand Dominance        Extremity/Trunk Assessment   Upper Extremity Assessment Upper Extremity Assessment: Defer to OT evaluation    Lower Extremity Assessment Lower Extremity Assessment: Generalized weakness    Cervical / Trunk Assessment Cervical / Trunk Assessment: Kyphotic  Communication   Communication: No difficulties  Cognition Arousal/Alertness: Awake/alert Behavior During Therapy: WFL for tasks assessed/performed Overall Cognitive Status: History of cognitive impairments - at baseline                                 General Comments: per daughter, 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 Seems generally confused in conversation and demos poor safety awareness        General Comments      Exercises     Assessment/Plan    PT Assessment Patient needs continued PT services  PT Problem List Decreased strength;Decreased activity tolerance;Decreased balance;Decreased mobility;Decreased safety awareness;Decreased cognition;Decreased knowledge of use of DME;Decreased knowledge of precautions       PT Treatment Interventions DME instruction;Gait training;Stair training;Functional mobility training;Balance training;Therapeutic activities;Therapeutic exercise;Patient/family education    PT Goals (Current goals can be found in the Care Plan section)  Acute Rehab PT Goals Patient Stated Goal: to go home PT Goal Formulation: With patient/family Time For  Goal Achievement: 11/28/21 Potential to Achieve Goals: Good    Frequency Min 3X/week     Co-evaluation               AM-PAC PT "6 Clicks" Mobility  Outcome Measure Help needed turning from your back to your side while in a flat bed without using bedrails?: None Help needed moving from lying on your back to sitting on the side of a flat bed without using bedrails?: None Help needed moving to and from a bed to a chair (including a wheelchair)?: A Little Help needed standing up from a chair using your arms (e.g., wheelchair or bedside chair)?: A Little Help needed to walk in hospital room?: A Little Help needed climbing 3-5 steps with a railing? : A Lot 6 Click Score: 19    End of Session Equipment Utilized During Treatment: Gait belt Activity Tolerance: Patient tolerated treatment well Patient left: in bed;with call bell/phone within reach;with bed alarm set;with family/visitor present Nurse Communication: Mobility status PT Visit Diagnosis: Unsteadiness on feet (R26.81);Muscle weakness (generalized) (M62.81);Difficulty in walking, not elsewhere classified (R26.2)    Time: 0175-1025 PT Time Calculation (min) (ACUTE ONLY): 31 min   Charges:   PT Evaluation $PT Eval Moderate Complexity: 1 Mod PT Treatments $Gait Training: 8-22 mins        Coulson Wehner A. Dan Humphreys PT, DPT Acute Rehabilitation Services Office [phone]   Viviann Spare 11/14/2021, 5:14 PM

## 2021-11-14 NOTE — Progress Notes (Signed)
Orthopedic Tech Progress Note Patient Details:  Wayne [name] 10-Jul-1940 [id number]   Ortho Devices Type of Ortho Device: Postop shoe/boot Ortho Device/Splint Location: RLE Ortho Device/Splint Interventions: Ordered, Application, Adjustment   Post Interventions Patient Tolerated: Well Instructions Provided: Care of device, Adjustment of device  Piper Hassebrock Carmine Savoy 11/14/2021, 2:24 PM

## 2021-11-14 NOTE — Progress Notes (Signed)
Subjective: Patient states he is feeling well.  No fevers or chills.  No significant pain in the foot.  Objective: NAD Erythema to the dorsal aspect of foot appears to be improved. There is still some dusky changes present the second toe but this has to have improved some.  Old blister noted the second toe.  There is no fluctuation crepitation there is no drainage or pus. No pain with calf compression, swelling, warmth, erythema  Assessment: PAD with resolving cellulitis; second toe fracture right foot  Plan: At this point continue local wound care and broad-spectrum antibiotics.  I will order surgical shoe for immobilization given the fracture in the right foot.  He is weightbearing as tolerated.  It will be discharged once medically stable with oral antibiotics.  We will schedule I week follow-up in the office.  Podiatry will continue to follow for now.   Ovid Curd, DPM

## 2021-11-14 NOTE — Progress Notes (Addendum)
Progress Note  Patient: Wayne [name] [id number] DOB: March 28, 1941  DOA: 11/11/2021  DOS: 11/14/2021    Brief hospital course: Wayne [name] is an 81 y.o. male with a history of CAD s/p stenting, HTN, and T2DM who has not been on medications and presented to the ED from urgent care from home on 8/16 due to increasing redness, swelling, and blister formation over the right foot and 2nd toe for the past 5 days. Urgent care referred to ED due to concern for arterial occlusive disease. He was afebrile with dusky discoloration and ruptured blister on dorsum of right 2nd toe. XR did not show an acute bony abnormality. WBC 8.4k, lactic acid 3.0. Antibiotics initiated, ABI ordered and vascular surgery consulted. MRI of the foot revealed a nondisplaced proximal 2nd phalanx fracture, likely subacute with edema without abscess or osteomyelitis. Podiatry is also consulted. Angiogram 8/18.   Assessment and Plan: Right foot cellulitis, diabetic foot infection: No abscess or osteomyelitis, no leukocytosis.  - Continue cefepime for now, likely to deescalate to ancef if continues to show improvement. Remains at high risk of treatment failure with PAD not amenable to intervention and untreated diabetes.  - Will monitor blood cultures from 8/16, NG3D currently  Right 2nd phalanx fracture: Likely subacute. 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  - Podiatry consult is much appreciated. Will follow up with Dr. Jacqualyn Posey. - Postop shoe, WBAT. PT/OT ordered.  PAD: Abnormal TBI's bilaterally with noncompressible ABIs, known CAD. Angiogram 8/18 patent CFA, and profunda artery. Superficial femoral artery: Diffusely diseased.  No stenosis greater than 50%. Popliteal artery: Diffusely diseased.  Widely patent. Anterior tibial artery: Occluded Tibioperoneal trunk: Widely patent Peroneal artery: Widely patent to the ankle.  Arborization to the PT, filling the foot. Posterior  tibial artery: Patent at its origin.  Patent about the ankle.  Fills the pedal circulation. Pedal circulation: Disadvantaged.  Fills only through PT.  Small vessel disease noted. - Vascular surgery consulted, recommended ASA daily, will arrange follow up. Pt has limited options for further vascular interventions.  - LDL 94, HDL 26 consistent with dyslipidemia. Started high intensity statin, will need recheck LFTs and lipid panel in ~8 weeks.  - Added aspirin  T2DM, uncontrolled/untreated with hyperglycemia: HbA1c 9.4%.  - Continue SSI, glargine is ordered. - PCP follow up appointment has been made for patient to reinitiate regular follow up, though this isn't until 9/26. Based on encounters by myself and other staff, I am concerned that left to his own devices, this patient is unlikely to manage his diabetes effectively after discharge. Will prescribe glucometer at discharge.  - Initiate oral hypoglycemics now to confirm tolerance, low dose glipizide, linagliptin. With lactic acidosis and impaired renal function, have to avoid metformin. Unclear what his insurance will cover. Do not think insulin is a viable treatment modality at this time.   Vitamin B12 deficiency: May be contributing to AMS, though this is unclear. - Supplementing IM, continue orally at discharge.   Anemia of chronic disease: Folic acid replete, Ferritin 477, iron 22, 12% saturation of a TIBC of only 188.  - B12 supp as above - Monitor  Lactic acidosis: Cleared with IV fluids. There has been no fever or hemodynamic evidence of sepsis. Sepsis currently is felt to be ruled out. No leukocytosis, though significant elevation of inflammatory markers noted.   Acute metabolic encephalopathy on suspected chronic cognitive impairment: More chronic, possible dementia. CT head without acute intracranial abnormality. There is advanced calcific atherosclerotic disease. Ammonia,  RPR negative. Mentation has improved.  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  - Renal function stable but worse than on admission in setting of vancomycin, angiogram, continued osmotic diuresis.  - With metabolic acidosis improving, can decrease rate of IV bicarb. Will plan to recheck in AM.   Subjective: No complaints. Says he'll check his blood sugars if we give him the glucose meter.   Objective: Vitals:   11/13/21 2309 11/14/21 0356 11/14/21 1016 11/14/21 1100  BP: 113/61 (!) 181/88 (!) 143/73 (!) 154/77  Pulse: 70 82 66 72  Resp: 20 20 20 20   Temp: 98.3 F (36.8 C) (!) 97.3 F (36.3 C) (!) 97.4 F (36.3 C) 98.1 F (36.7 C)  TempSrc: Oral Oral Oral Oral  SpO2: 95% 92% 92% 90%  Weight:      Height:       Elderly male in no distress MMM Clear, nonlabored RRR without MRG or JVD. No significant dependent edema Alert, oriented, no focal deficits Right foot appears essentially unchanged from yesterday's exam with erythema receded from demarcation.  DP pulse on R not palpable, +PT pulse. Foot is warm with fair cap refill.   Data Personally reviewed: CBC: Recent Labs  Lab 11/11/21 1025 11/11/21 2155 11/12/21 0015  WBC 8.8 8.4 8.7  NEUTROABS 6.8  --   --   HGB 12.5* 10.6* 11.1*  HCT 36.5* 30.4* 31.9*  MCV 88.4 86.6 88.6  PLT 233 201 210   Basic Metabolic Panel: Recent Labs  Lab 11/11/21 1025 11/11/21 2155 11/12/21 0015 11/13/21 0550 11/14/21 0151  NA 135  --  137 135 133*  K 4.3  --  4.0 4.5 4.4  CL 98  --  105 106 102  CO2 25  --  23 17* 20*  GLUCOSE 311*  --  235* 199* 351*  BUN 43*  --  31* 33* 43*  CREATININE 1.48* 1.36* 1.29* 1.59* 1.51*  CALCIUM 9.5  --  8.1* 8.1* 7.9*   GFR: Estimated Creatinine Clearance: 41.4 mL/min (A) (by C-G formula based on SCr of 1.51 mg/dL (H)). Liver Function Tests: Recent Labs  Lab 11/11/21 1025  AST 14*  ALT 19  ALKPHOS 194*  BILITOT 1.2  PROT 8.0  ALBUMIN 4.0   No results for input(s):  "LIPASE", "AMYLASE" in the last 168 hours. Recent Labs  Lab 11/12/21 0455  AMMONIA 11   Coagulation Profile: Recent Labs  Lab 11/11/21 1025  INR 1.1   Cardiac Enzymes: No results for input(s): "CKTOTAL", "CKMB", "CKMBINDEX", "TROPONINI" in the last 168 hours. BNP (last 3 results) No results for input(s): "PROBNP" in the last 8760 hours. HbA1C: Recent Labs    11/12/21 0015  HGBA1C 9.4*   CBG: Recent Labs  Lab 11/13/21 0813 11/13/21 1033 11/13/21 1725 11/13/21 2129 11/14/21 0604  GLUCAP 182* 206* 267* 270* 291*   Lipid Profile: Recent Labs    11/13/21 0550  CHOL 136  HDL 26*  LDLCALC 94  TRIG 81  CHOLHDL 5.2   Thyroid Function Tests: Recent Labs    11/12/21 0455  TSH 2.113   Anemia Panel: Recent Labs    11/12/21 0455  VITAMINB12 168*  FOLATE 16.2  FERRITIN 477*  TIBC 188*  IRON 22*  RETICCTPCT 1.5   Urine analysis:    Component Value Date/Time   COLORURINE YELLOW 11/11/2021 1239   APPEARANCEUR CLEAR 11/11/2021 1239   LABSPEC 1.018 11/11/2021 1239   PHURINE 6.0 11/11/2021 1239  GLUCOSEU >1,000 (A) 11/11/2021 1239   HGBUR NEGATIVE 11/11/2021 1239   BILIRUBINUR NEGATIVE 11/11/2021 1239   KETONESUR NEGATIVE 11/11/2021 1239   PROTEINUR TRACE (A) 11/11/2021 1239   UROBILINOGEN 0.2 06/13/2012 0325   NITRITE NEGATIVE 11/11/2021 1239   LEUKOCYTESUR NEGATIVE 11/11/2021 1239   Recent Results (from the past 240 hour(s))  Culture, blood (Routine x 2)     Status: None (Preliminary result)   Collection Time: 11/11/21 10:25 AM   Specimen: Left Antecubital; Blood  Result Value Ref Range Status   Specimen Description   Final    LEFT ANTECUBITAL BLOOD Performed at [facility], [address] [place], [place] [address]    Special Requests   Final    BOTTLES DRAWN AEROBIC AND ANAEROBIC Blood Culture results may not be optimal due to an excessive volume of blood received in culture bottles Performed at [facility], [address], [place], [place] [address]    Culture   Final    NO GROWTH 3 DAYS Performed at [facility], [address] [place], [place] [address]    Report Status PENDING  Incomplete  Culture, blood (Routine x 2)     Status: None (Preliminary result)   Collection Time: 11/11/21 10:25 AM   Specimen: Right Antecubital; Blood  Result Value Ref Range Status   Specimen Description   Final    RIGHT ANTECUBITAL BLOOD Performed at [facility], [address], [place], [place] [address]    Special Requests   Final    BOTTLES DRAWN AEROBIC AND ANAEROBIC Blood Culture adequate volume Performed at [facility], [address], [place], [place] [address]    Culture   Final    NO GROWTH 3 DAYS Performed at [facility], [address], [place], [place] [address]    Report Status PENDING  Incomplete     PERIPHERAL VASCULAR CATHETERIZATION  Result Date: 11/13/2021 DATE OF SERVICE: 11/13/2021  PATIENT:  Wayne [name]  81 y.o. 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 [name], MD  ASSISTANT: none  ANESTHESIA:   local  ESTIMATED BLOOD LOSS: minimal  LOCAL MEDICATIONS USED:  LIDOCAINE  COUNTS: confirmed correct.  PATIENT DISPOSITION:  PACU - hemodynamically stable.  Delay start of Pharmacological VTE agent (>24hrs) due to surgical blood loss or risk of bleeding: no  INDICATION FOR PROCEDURE: Wayne [name] is a 81 y.o. male with right foot ulcer with peripheral arterial disease. After careful discussion of risks, benefits, and alternatives the patient was offered angiography. The patient understood and wished to proceed.  OPERATIVE FINDINGS: Terminal aorta and  iliac arteries: Widely patent without flow-limiting stenosis  Right lower extremity: Common femoral artery: Widely patent without flow-limiting stenosis Profunda femoris artery: Widely patent without flow-limiting stenosis Superficial femoral artery: Diffusely diseased.  No stenosis greater than 50%. Popliteal artery: Diffusely diseased.  Widely patent. Anterior tibial artery: Occluded Tibioperoneal trunk: Widely patent Peroneal artery: Widely patent to the ankle.  Arborization to the PT, filling the foot. Posterior tibial artery: Patent at its origin.  Patent about the ankle.  Fills the pedal circulation. Pedal circulation: Disadvantaged.  Fills only through PT.  Small vessel disease noted.  GLASS score.  Not applicable.  Inline flow to the ankle.  Wifi score 1 / 1 / 2.  Clinical stage III.  DESCRIPTION OF PROCEDURE: After identification of the patient in the pre-operative holding area, the patient was transferred to the operating room. The patient was positioned supine on the operating room table. The groins were prepped and draped in standard fashion. A surgical pause was performed confirming correct patient, procedure, and operative location.  The left groin was anesthetized with subcutaneous injection of 1% lidocaine. Using ultrasound guidance, the left common femoral artery was accessed with micropuncture technique. Fluoroscopy was used to confirm cannulation over the femoral head. The 61F sheath was upsized to 58F.  A Benson wire was advanced into the distal aorta. Over the wire an omni flush catheter was advanced to the level of L2. Aortogram was performed - see above for details.  The right common iliac artery was selected with an omniflush catheter and Bentson guidewire. The wire was advanced into the common femoral artery. Over the wire the omni flush catheter was advanced into the external iliac artery. Selective angiography was performed - see above for details.  The decision was made to intervene. The  patient was heparinized with 8000 units of heparin. The 58F sheath was exchanged for a 13F x 45cm sheath. Selective angiography of the left lower extremity was performed prior to intervention.  I tried to recanalize the posterior tibial artery with a variety of wire and catheter exchanges.  I was unable to cross the lesion antegrade.  I elected to stop here.  All endovascular equipment was then removed.  A minx device was used to close the arteriotomy. Hemostasis was excellent upon completion.  Upon completion of the case instrument and sharps counts were confirmed correct. The patient was transferred to the PACU in good condition. I was present for all portions of the procedure.  PLAN: ASA 81mg  PO QD. High intensity statin therapy.  Inline flow to the ankle.  Patient optimized from a vascular standpoint.  Would recommend local care to the toe only.  Would not recommend amputation at this time.  We will follow closely clinically.  If he has deterioration, we could consider retrograde posterior tibial access.  Yevonne Aline. Stanford Breed, MD [facility] Phone Number: [phone] 11/13/2021 9:53 AM    Family Communication: None at bedside this AM   Disposition: Status is: Inpatient Remains inpatient appropriate because: Continue IV antibiotics for DFI with ongoing risk factors for poor wound healing. Glucose 351mg /dl this AM. Will titrate treatment prior to discharge.  Planned Discharge Destination: TBD, PT/OT pending  Patrecia Pour, MD 11/14/2021 2:10 PM Page by [email or web address]

## 2021-11-15 DIAGNOSIS — L03119 Cellulitis of unspecified part of limb: Secondary | ICD-10-CM | POA: Diagnosis not present

## 2021-11-15 DIAGNOSIS — E119 Type 2 diabetes mellitus without complications: Secondary | ICD-10-CM | POA: Diagnosis not present

## 2021-11-15 DIAGNOSIS — I2510 Atherosclerotic heart disease of native coronary artery without angina pectoris: Secondary | ICD-10-CM | POA: Diagnosis not present

## 2021-11-15 DIAGNOSIS — L03115 Cellulitis of right lower limb: Secondary | ICD-10-CM | POA: Diagnosis not present

## 2021-11-15 DIAGNOSIS — Z9861 Coronary angioplasty status: Secondary | ICD-10-CM | POA: Diagnosis not present

## 2021-11-15 LAB — GLUCOSE, CAPILLARY
Glucose-Capillary: 138 mg/dL — ABNORMAL HIGH (ref 70–99)
Glucose-Capillary: 158 mg/dL — ABNORMAL HIGH (ref 70–99)
Glucose-Capillary: 152 mg/dL — ABNORMAL HIGH (ref 70–99)
Glucose-Capillary: 80 mg/dL (ref 70–99)

## 2021-11-15 LAB — BASIC METABOLIC PANEL
Anion gap: 11 (ref 5–15)
BUN: 37 mg/dL — ABNORMAL HIGH (ref 8–23)
CO2: 21 mmol/L — ABNORMAL LOW (ref 22–32)
Calcium: 8 mg/dL — ABNORMAL LOW (ref 8.9–10.3)
Chloride: 103 mmol/L (ref 98–111)
Creatinine, Ser: 1.44 mg/dL — ABNORMAL HIGH (ref 0.61–1.24)
GFR, Estimated: 49 mL/min — ABNORMAL LOW (ref >60)
Glucose, Bld: 131 mg/dL — ABNORMAL HIGH (ref 70–99)
Potassium: 3.6 mmol/L (ref 3.5–5.1)
Sodium: 135 mmol/L (ref 135–145)

## 2021-11-15 LAB — CULTURE, BLOOD (ROUTINE X 2)

## 2021-11-15 MED ORDER — BLOOD GLUCOSE MONITOR KIT
PACK | 0 refills | Status: DC
  Filled 2021-11-15: qty 1, fill #0

## 2021-11-15 MED ORDER — GLIPIZIDE 5 MG PO TABS: 2.5000 mg | ORAL_TABLET | Freq: Every day | ORAL | 0 refills | Status: DC

## 2021-11-15 MED ORDER — ASPIRIN 81 MG PO TBEC: 81.0000 mg | DELAYED_RELEASE_TABLET | Freq: Every day | ORAL | 1 refills | Status: DC

## 2021-11-15 MED ORDER — CEPHALEXIN 500 MG PO CAPS: 500.0000 mg | ORAL_CAPSULE | Freq: Two times a day (BID) | ORAL | 0 refills | Status: DC

## 2021-11-15 MED ORDER — GLIPIZIDE 5 MG PO TABS
2.5000 mg | ORAL_TABLET | Freq: Every day | ORAL | 0 refills | Status: DC
  Filled 2021-11-15: qty 30, 60d supply, fill #0

## 2021-11-15 MED ORDER — MUPIROCIN 2 % EX OINT
TOPICAL_OINTMENT | Freq: Two times a day (BID) | CUTANEOUS | Status: DC
  Filled 2021-11-15: qty 22

## 2021-11-15 MED ORDER — LINAGLIPTIN 5 MG PO TABS: 5.0000 mg | ORAL_TABLET | Freq: Every day | ORAL | 1 refills | Status: DC

## 2021-11-15 MED ORDER — ATORVASTATIN CALCIUM 40 MG PO TABS: 40.0000 mg | ORAL_TABLET | Freq: Every day | ORAL | 1 refills | Status: DC

## 2021-11-15 MED ORDER — BLOOD GLUCOSE MONITOR KIT: PACK | 0 refills | Status: DC

## 2021-11-15 MED ORDER — LISINOPRIL 10 MG PO TABS: 10.0000 mg | ORAL_TABLET | Freq: Every day | ORAL | 1 refills | Status: DC

## 2021-11-15 NOTE — Discharge Summary (Signed)
Physician Discharge Summary   Patient: Wayne [name] MRN: [id number] DOB: 1941/02/11  Admit date:     11/11/2021  Discharge date: {dischdate:26783}  Discharge Physician: Patrecia Pour   PCP: Patient, No Pcp Per   Recommendations at discharge:  {Tip this will not be part of the note when signed- Example include specific recommendations for outpatient follow-up, pending tests to follow-up on. (Optional):26781}  ***  Discharge Diagnoses: Principal Problem:   Cellulitis of right foot Active Problems:   Diabetes mellitus type 2 in nonobese [facility])   CAD S/P percutaneous coronary angioplasty   Cellulitis  Resolved Problems:   * No resolved hospital problems. [facility] Course: No notes on file  Assessment and Plan: No notes have been filed under this hospital service. Service: Hospitalist     {Tip this will not be part of the note when signed Body mass index is 25.55 kg/m. , ,  (Optional):26781}  {(NOTE) Pain control PDMP Statment (Optional):26782} Consultants: *** Procedures performed: ***  Disposition: {Plan; Disposition:26390} Diet recommendation:  Discharge Diet Orders (From admission, onward)     Start     Ordered   11/15/21 0000  Diet - low sodium heart healthy        11/15/21 1219   11/15/21 0000  Diet Carb Modified        11/15/21 1219           {Diet_Plan:26776} DISCHARGE MEDICATION: Allergies as of 11/15/2021   No Known Allergies      Medication List     STOP taking these medications    cyclobenzaprine 10 MG tablet Commonly known as: FLEXERIL   metFORMIN 500 MG tablet Commonly known as: Glucophage   naproxen 500 MG tablet Commonly known as: Naprosyn       TAKE these medications    aspirin EC 81 MG tablet Take 1 tablet (81 mg total) by mouth daily. Swallow whole. Start taking on: November 16, 2021   atorvastatin 40 MG tablet Commonly known as: LIPITOR Take 1 tablet (40 mg total) by mouth daily. Start taking on: November 16, 2021   blood glucose meter kit and supplies Kit Dispense based on patient and insurance preference. Use up to four times daily as directed.   cephALEXin 500 MG capsule Commonly known as: KEFLEX Take 1 capsule (500 mg total) by mouth 2 (two) times daily.   glipiZIDE 5 MG tablet Commonly known as: GLUCOTROL Take 0.5 tablets (2.5 mg total) by mouth daily before breakfast. Start taking on: November 16, 2021   glucose blood test strip Use as instructed   ibuprofen 200 MG tablet Commonly known as: ADVIL Take 200 mg by mouth every 6 (six) hours as needed.   linagliptin 5 MG Tabs tablet Commonly known as: TRADJENTA Take 1 tablet (5 mg total) by mouth daily.   lisinopril 10 MG tablet Commonly known as: ZESTRIL Take 1 tablet (10 mg total) by mouth daily.               Discharge Care Instructions  (From admission, onward)           Start     Ordered   11/15/21 0000  Discharge wound care:       Comments: Keep the toe/foot clean and dry, cover any open wound with a clean bandage and replace when it gets dirty.   11/15/21 1219            Follow-up Information     de [place], Raymond J, MD. Daphane Shepherd  on 12/22/2021.   Specialty: Family Medicine Why: You are scheduled for a primary care visit on Tuesday, December 22, 2021 at 1:10 pm. Contact information: [place] [address] [phone]         Cherre Robins, MD Follow up in 4 week(s).   Specialties: Vascular Surgery, Interventional Cardiology Why: Office will call you to arrange your appt (sent) Contact information: [place] [address] [place], [place], DPM. Call in 1 week(s).   Specialty: Podiatry Contact information: [address] [place] [address] [phone]                Discharge Exam: Danley Danker Weights   11/11/21 1452 11/11/21 1958 11/13/21 1325  Weight: 85.7 kg 81.7 kg 85.5 kg   ***  Condition at discharge:  {DC Condition:26389}  The results of significant diagnostics from this hospitalization (including imaging, microbiology, ancillary and laboratory) are listed below for reference.   Imaging Studies: PERIPHERAL VASCULAR CATHETERIZATION  Result Date: 11/13/2021 DATE OF SERVICE: 11/13/2021  PATIENT:  Wayne [name]  81 y.o. 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  PATIENT DISPOSITION:  PACU - hemodynamically stable.  Delay start of Pharmacological VTE agent (>24hrs) due to surgical blood loss or risk of bleeding: no  INDICATION FOR PROCEDURE: ATravian Kerneris a 81y.o. male with right foot ulcer with peripheral arterial disease. After careful discussion of risks, benefits, and alternatives the patient was offered angiography. The patient understood and wished to proceed.  OPERATIVE FINDINGS: Terminal aorta and iliac arteries: Widely patent without flow-limiting stenosis  Right lower extremity: Common femoral artery: Widely patent without flow-limiting stenosis Profunda femoris artery: Widely patent without flow-limiting stenosis Superficial femoral artery: Diffusely diseased.  No stenosis greater than 50%. Popliteal artery: Diffusely diseased.  Widely patent. Anterior tibial artery: Occluded Tibioperoneal trunk: Widely patent Peroneal artery: Widely patent to the ankle.  Arborization to the PT, filling the foot. Posterior tibial artery: Patent at its origin.  Patent about the ankle.  Fills the pedal circulation. Pedal circulation: Disadvantaged.  Fills only through  PT.  Small vessel disease noted.  GLASS score.  Not applicable.  Inline flow to the ankle.  Wifi score 1 / 1 / 2.  Clinical stage III.  DESCRIPTION OF PROCEDURE: After identification of the patient in the pre-operative holding area, the patient was transferred to the operating room. The patient was positioned supine on the operating room table. The groins were prepped and draped in standard fashion. A surgical pause was performed confirming correct patient, procedure, and operative location.  The left groin was anesthetized with subcutaneous injection of 1% lidocaine. Using ultrasound guidance, the left common femoral artery was accessed with micropuncture technique. Fluoroscopy was used to confirm cannulation over the femoral head. The 68F sheath was upsized to 24F.  A Benson wire was advanced into the distal aorta. Over the wire an omni flush catheter was advanced to the level of L2. Aortogram was performed - see above for details.  The right common iliac artery  was selected with an omniflush catheter and Bentson guidewire. The wire was advanced into the common femoral artery. Over the wire the omni flush catheter was advanced into the external iliac artery. Selective angiography was performed - see above for details.  The decision was made to intervene. The patient was heparinized with 8000 units of heparin. The 60F sheath was exchanged for a 42F x 45cm sheath. Selective angiography of the left lower extremity was performed prior to intervention.  I tried to recanalize the posterior tibial artery with a variety of wire and catheter exchanges.  I was unable to cross the lesion antegrade.  I elected to stop here.  All endovascular equipment was then removed.  A minx device was used to close the arteriotomy. Hemostasis was excellent upon completion.  Upon completion of the case instrument and sharps counts were confirmed correct. The patient was transferred to the PACU in good condition. I was present for all portions  of the procedure.  PLAN: ASA 58m PO QD. High intensity statin therapy.  Inline flow to the ankle.  Patient optimized from a vascular standpoint.  Would recommend local care to the toe only.  Would not recommend amputation at this time.  We will follow closely clinically.  If he has deterioration, we could consider retrograde posterior tibial access.  TYevonne Aline HStanford Breed MD [facility] HospitalPhone Number: [phone]/18/2023 9:53 AM   VAS [place]I WITH/WO TBI  Result Date: 11/12/2021  LOWER EXTREMITY DOPPLER STUDY Patient Name:  ASWADE SHONKA Date of Exam:   11/12/2021 Medical Rec #: [id number]         Accession #:    [id number] of Birth: 121-Apr-1942        Patient Gender: M Patient Age:   89years [facility]      [facility] [place] WITH/WO TBI Referring Phys: AGean Birchwood--------------------------------------------------------------------------------  Indications: Pain in right toe. High Risk Factors: Diabetes.  Comparison Study: No previous exam noted. Performing Technologist: RBobetta LimeBS, RVT  Examination Guidelines: A complete evaluation includes at minimum, Doppler waveform signals and systolic blood pressure reading at the level of bilateral brachial, anterior tibial, and posterior tibial arteries, when vessel segments are accessible. Bilateral testing is considered an integral part of a complete examination. Photoelectric Plethysmograph (PPG) waveforms and toe systolic pressure readings are included as required and additional duplex testing as needed. Limited examinations for reoccurring indications may be performed as noted.  ABI Findings: +---------+------------------+-----+-------------------+--------+ Right    Rt Pressure (mmHg)IndexWaveform           Comment  +---------+------------------+-----+-------------------+--------+ Brachial 157                    triphasic                    +---------+------------------+-----+-------------------+--------+ PTA      255               1.60 dampened monophasic         +---------+------------------+-----+-------------------+--------+ DP       103               0.65 dampened monophasic         +---------+------------------+-----+-------------------+--------+ Great Toe49                0.31                             +---------+------------------+-----+-------------------+--------+ +---------+------------------+-----+--------------------+-------+  Left     Lt Pressure (mmHg)IndexWaveform            Comment +---------+------------------+-----+--------------------+-------+ Brachial 159                    triphasic                   +---------+------------------+-----+--------------------+-------+ PTA      255               1.60 dampened monophasic         +---------+------------------+-----+--------------------+-------+ DP       255               1.60 Hyperemic/monophasic        +---------+------------------+-----+--------------------+-------+ Great Toe66                0.42                             +---------+------------------+-----+--------------------+-------+ +-------+-----------+-----------+------------+------------+ ABI/TBIToday's ABIToday's TBIPrevious ABIPrevious TBI +-------+-----------+-----------+------------+------------+ Right  Flora         0.31                                +-------+-----------+-----------+------------+------------+ Left            0.42                                +-------+-----------+-----------+------------+------------+  Summary: Right: Resting right ankle-brachial index indicates noncompressible right lower extremity arteries. The right toe-brachial index is abnormal. Left: Resting left ankle-brachial index indicates noncompressible left lower extremity arteries. The left toe-brachial index is abnormal. *See table(s) above for measurements and  observations.  Electronically signed by Monica [name] MD on 11/12/2021 at 3:13:14 PM.    Final    MR FOOT RIGHT WO CONTRAST  Result Date: 11/12/2021 CLINICAL DATA:  Foot swelling, nondiabetic, osteomyelitis suspected EXAM: MRI OF THE RIGHT FOREFOOT WITHOUT CONTRAST TECHNIQUE: Multiplanar, multisequence MR imaging of the right forefoot was performed. No intravenous contrast was administered. COMPARISON:  Right foot radiograph 11/11/2021 FINDINGS: Bones/Joint/Cartilage Nondisplaced fracture at the base of the second toe proximal phalanx, which may be subacute. Minimal marrow edema in the proximal phalanx. No other significant marrow signal alteration. Ligaments Intact Lisfranc ligament.  Intact MTP collateral ligaments. Muscles and Tendons No acute tendon tear. Diffuse intramuscular edema and atrophy in the foot as is commonly seen in diabetics. Soft tissues Generalized soft tissue swelling of the foot, with confluent edema along the second toe (series 4 images 8-15). No focal fluid collection. There is an adjacent blister like skin lesion. IMPRESSION: Nondisplaced fracture at base of the second toe proximal phalanx, which may be subacute. Adjacent confluent edema and a blister-like skin lesion noted along the plantar aspect of the second toe noted. No evidence of soft tissue abscess or osteomyelitis. Electronically Signed   By: Maurine Simmering M.D.   On: 11/12/2021 10:05   CT HEAD WO CONTRAST (5MM)  Result Date: 11/12/2021 CLINICAL DATA:  81 year old male with altered mental status. EXAM: CT HEAD WITHOUT CONTRAST TECHNIQUE: Contiguous axial images were obtained from the base of the skull through the vertex without intravenous contrast. RADIATION DOSE REDUCTION: This exam was performed according to the departmental dose-optimization program which includes automated exposure control, adjustment of the mA and/or kV according to patient size and/or use  of iterative reconstruction technique. COMPARISON:  CT  cervical spine 02/27/2009. FINDINGS: Brain: Cerebral volume loss appears fairly generalized. No midline shift, ventriculomegaly, mass effect, evidence of mass lesion, intracranial hemorrhage or evidence of cortically based acute infarction. Gray-white matter differentiation is within normal limits for age in both hemispheres. No cortical encephalomalacia identified. Vascular: Extensive Calcified atherosclerosis at the skull base. No suspicious intracranial vascular hyperdensity. Skull: No acute osseous abnormality identified. Sinuses/Orbits: Visualized paranasal sinuses and mastoids are well aerated. Other: No acute orbit or scalp soft tissue finding. Fairly extensive calcified scalp vessel atherosclerosis. IMPRESSION: 1. No acute intracranial abnormality. Unremarkable for age noncontrast CT appearance of the brain. 2. Advanced calcified atherosclerosis, including involvement of scalp vessels. This appearance can be seen with End stage renal disease. Electronically Signed   By: Genevie Ann M.D.   On: 11/12/2021 05:49   DG Foot Complete Right  Result Date: 11/11/2021 CLINICAL DATA:  Right foot wound infection. EXAM: RIGHT FOOT COMPLETE - 3+ VIEW COMPARISON:  None Available. FINDINGS: There is no evidence of fracture or dislocation. No lytic destruction is noted. Vascular calcifications are noted. Mild posterior calcaneal spurring is noted. IMPRESSION: No acute abnormality seen. Electronically Signed   By: Marijo Conception M.D.   On: 11/11/2021 12:35   DG Chest 2 View  Result Date: 11/11/2021 CLINICAL DATA:  Suspected sepsis EXAM: CHEST - 2 VIEW COMPARISON:  None FINDINGS: Heart is normal size. No confluent airspace opacities or effusions. Old healed left rib fracture. No acute bony abnormality. IMPRESSION: No active cardiopulmonary disease. Electronically Signed   By: Rolm Baptise M.D.   On: 11/11/2021 11:01    Microbiology: Results for orders placed or performed during the hospital encounter of 11/11/21   Culture, blood (Routine x 2)     Status: None (Preliminary result)   Collection Time: 11/11/21 10:25 AM   Specimen: Left Antecubital; Blood  Result Value Ref Range Status   Specimen Description   Final    LEFT ANTECUBITAL BLOOD Performed at [facility] [address] [place], [place] [address]    Special Requests   Final    BOTTLES DRAWN AEROBIC AND ANAEROBIC Blood Culture results may not be optimal due to an excessive volume of blood received in culture bottles Performed at [facility], [address], [place], [place] [address]    Culture   Final    NO GROWTH 4 DAYS Performed at [facility], [place] [address] [place], [place] [address]    Report Status PENDING  Incomplete  Culture, blood (Routine x 2)     Status: None (Preliminary result)   Collection Time: 11/11/21 10:25 AM   Specimen: Right Antecubital; Blood  Result Value Ref Range Status   Specimen Description   Final    RIGHT ANTECUBITAL BLOOD Performed at [facility], [place] [address], [place], [place] [address]    Special Requests   Final    BOTTLES DRAWN AEROBIC AND ANAEROBIC Blood Culture adequate volume Performed at [facility], [address] [place], [place] [address]    Culture   Final    NO GROWTH 4 DAYS Performed at [facility], [place] [address] [place], [place] [address]    Report Status PENDING  Incomplete    Labs: CBC: Recent Labs  Lab 11/11/21 1025 11/11/21 2155 11/12/21 0015  WBC 8.8 8.4 8.7  NEUTROABS 6.8  --   --   HGB 12.5* 10.6* 11.1*  HCT 36.5* 30.4*  31.9*  MCV 88.4 86.6 88.6  PLT 233 201 094   Basic Metabolic Panel: Recent Labs  Lab 11/11/21 1025 11/11/21 2155 11/12/21 0015 11/13/21 0550 11/14/21 0151 11/15/21 0339  NA 135  --  137 135 133* 135  K 4.3  --  4.0 4.5 4.4 3.6  CL 98  --  105 106 102 103  CO2 25  --  23 17* 20* 21*  GLUCOSE 311*  --  235* 199* 351* 131*  BUN 43*  --  31* 33* 43* 37*  CREATININE  1.48* 1.36* 1.29* 1.59* 1.51* 1.44*  CALCIUM 9.5  --  8.1* 8.1* 7.9* 8.0*   Liver Function Tests: Recent Labs  Lab 11/11/21 1025  AST 14*  ALT 19  ALKPHOS 194*  BILITOT 1.2  PROT 8.0  ALBUMIN 4.0   CBG: Recent Labs  Lab 11/14/21 0604 11/14/21 1812 11/14/21 2130 11/15/21 0612 11/15/21 1129  GLUCAP 291* 186* 116* 138* 158*    Discharge time spent: {LESS THAN/GREATER THAN:26388} 30 minutes.  Signed: Patrecia Pour, MD [facility] 11/15/2021

## 2021-11-15 NOTE — Progress Notes (Signed)
Progress Note  Patient: Wayne [name] [id number] DOB: Jan 24, 1941  DOA: 11/11/2021  DOS: 11/15/2021    Brief hospital course: Wayne [name] is an 81 y.o. male with a history of CAD s/p stenting, HTN, and T2DM who has not been on medications and presented to the ED from urgent care from home on 8/16 due to increasing redness, swelling, and blister formation over the right foot and 2nd toe for the past 5 days. Urgent care referred to ED due to concern for arterial occlusive disease. He was afebrile with dusky discoloration and ruptured blister on dorsum of right 2nd toe. XR did not show an acute bony abnormality. WBC 8.4k, lactic acid 3.0. Antibiotics initiated, ABI ordered and vascular surgery consulted. MRI of the foot revealed a nondisplaced proximal 2nd phalanx fracture, likely subacute with edema without abscess or osteomyelitis. Podiatry was consulted, recommended routine wound care, WBAT in postop shoe, and expedited follow up. Angiogram 8/18 revealed PAD without intervention. Aspirin and statin started. Glycemic control has improved and the patient has clinically sustained improvement. Plan to discharge home with oral antibiotics for 7 more days, new diabetes medications and glucometer, home health RN, PT, OT. PCP appointment made.   RN reported she suspects patient picked at the left femoral access site and it began slowly bleeding. Vascular surgery consultant evaluated, recommended pressure. Since the patient lives alone and will not have supervision consistently, they request continued observation overnight which is reasonable.   Assessment and Plan: Right foot cellulitis, diabetic foot infection: No abscess or osteomyelitis, no leukocytosis.  - Continue cefepime while inpatient > convert to keflex at discharge, planning 7 more days.   - Will monitor blood cultures from 8/16, NG4D currently  Right 2nd phalanx fracture: Likely subacute. Suspect diabetic neuropathy and neuropathy  related to B12 deficiency as well.  - Podiatry consult is much appreciated. Will follow up with Dr. Jacqualyn Posey. - Postop shoe, WBAT.  - Plan for simple wound care, though denuded skin s/p blister rupture may benefit from mupirocin. WOC consulted for further recommendations.  PAD: Abnormal TBI's bilaterally with noncompressible ABIs, known CAD. Angiogram 8/18 patent CFA, and profunda artery. Superficial femoral artery: Diffusely diseased.  No stenosis greater than 50%. Popliteal artery: Diffusely diseased.  Widely patent. Anterior tibial artery: Occluded Tibioperoneal trunk: Widely patent Peroneal artery: Widely patent to the ankle.  Arborization to the PT, filling the foot. Posterior tibial artery: Patent at its origin.  Patent about the ankle.  Fills the pedal circulation. Pedal circulation: Disadvantaged.  Fills only through PT.  Small vessel disease noted. - Vascular surgery consulted, recommended ASA daily, will arrange follow up. Pt has limited options for further vascular interventions.  - LDL 94, HDL 26 consistent with dyslipidemia. Started high intensity statin, will need recheck LFTs and lipid panel in ~8 weeks.   T2DM, uncontrolled/untreated with hyperglycemia: HbA1c 9.4%.  - Continue SSI, glargine as ordered while inpatient, doubt patient would be able/willing to comply with insulin. Plan to discharge on glipizide and linagliptin which he has tolerated. - PCP follow up appointment has been made for patient to reinitiate regular follow up - Prescribing glucometer to check CBGs and record values. Call PCP office if CBG consistently >250 to request a more expedited appointment.  - Renal function does not allow for metformin.  Vitamin B12 deficiency: May be contributing to AMS, though this is unclear. - Supplementing IM, continue orally at discharge.   Anemia of chronic disease: Folic acid replete, Ferritin 477, iron 22, 12% saturation of a TIBC  of only 188.  - B12 supp as above -  Monitor  Lactic acidosis: Cleared with IV fluids. There has been no fever or hemodynamic evidence of sepsis. Sepsis currently is felt to be ruled out. No leukocytosis, though significant elevation of inflammatory markers noted.   Acute metabolic encephalopathy on suspected chronic cognitive impairment: More chronic, possible dementia. CT head without acute intracranial abnormality. There is advanced calcific atherosclerotic disease. Ammonia, RPR negative. Mentation has improved.  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  - Renal function improving but had slightly worsened in setting of vancomycin, angiogram, continued osmotic diuresis. Recommend recheck at follow up.  Subjective: Says pain in the foot is minimal, no fevers, feels well. Willing to take new medications and check blood sugars.   Objective: Vitals:   11/14/21 1951 11/14/21 2329 11/15/21 0342 11/15/21 0744  BP: (!) 153/78 (!) 126/54 (!) 163/84 (!) 159/75  Pulse: 75 66 85 83  Resp: 20 18 20    Temp: 97.9 F (36.6 C) (!) 97.3 F (36.3 C) 97.9 F (36.6 C) 97.6 F (36.4 C)  TempSrc: Oral Oral Oral Oral  SpO2: 90% 96% 90% (!) 89%  Weight:      Height:       Gen: Pleasant, elderly edentulous male in no distress Pulm: Nonlabored breathing room air. Clear. CV: Regular rate and rhythm. No murmur, rub, or gallop. No JVD, no significant dependent edema. GI: Abdomen soft, non-tender, non-distended, with normoactive bowel sounds.  Ext: Warm, no deformities Skin: Right 2nd toe dorsal wound extending slightly onto forefoot  blister unroofed and now absent with exposed denuded skin with healthy tissue, no exudate or hemorrhage. Erythema and swelling at mid/forefoot is decreased.  Neuro: Alert ,oriented without focal neurological deficits. Psych: Judgement and insight appear marginal. Mood euthymic & affect congruent. Behavior is appropriate.    Data Personally  reviewed: CBC: Recent Labs  Lab 11/11/21 1025 11/11/21 2155 11/12/21 0015  WBC 8.8 8.4 8.7  NEUTROABS 6.8  --   --   HGB 12.5* 10.6* 11.1*  HCT 36.5* 30.4* 31.9*  MCV 88.4 86.6 88.6  PLT 233 201 210   Basic Metabolic Panel: Recent Labs  Lab 11/11/21 1025 11/11/21 2155 11/12/21 0015 11/13/21 0550 11/14/21 0151 11/15/21 0339  NA 135  --  137 135 133* 135  K 4.3  --  4.0 4.5 4.4 3.6  CL 98  --  105 106 102 103  CO2 25  --  23 17* 20* 21*  GLUCOSE 311*  --  235* 199* 351* 131*  BUN 43*  --  31* 33* 43* 37*  CREATININE 1.48* 1.36* 1.29* 1.59* 1.51* 1.44*  CALCIUM 9.5  --  8.1* 8.1* 7.9* 8.0*   GFR: Estimated Creatinine Clearance: 43.4 mL/min (A) (by C-G formula based on SCr of 1.44 mg/dL (H)). Liver Function Tests: Recent Labs  Lab 11/11/21 1025  AST 14*  ALT 19  ALKPHOS 194*  BILITOT 1.2  PROT 8.0  ALBUMIN 4.0   No results for input(s): "LIPASE", "AMYLASE" in the last 168 hours. Recent Labs  Lab 11/12/21 0455  AMMONIA 11   Coagulation Profile: Recent Labs  Lab 11/11/21 1025  INR 1.1   Cardiac Enzymes: No results for input(s): "CKTOTAL", "CKMB", "CKMBINDEX", "TROPONINI" in the last 168 hours. BNP (last 3 results) No results for input(s): "PROBNP" in the last 8760 hours. HbA1C: No results for input(s): "HGBA1C" in the last 72 hours.  CBG: Recent Labs  Lab 11/14/21 0604 11/14/21 1812 11/14/21 2130 11/15/21 0612 11/15/21 1129  GLUCAP 291* 186* 116* 138* 158*   Lipid Profile: Recent Labs    11/13/21 0550  CHOL 136  HDL 26*  LDLCALC 94  TRIG 81  CHOLHDL 5.2   Thyroid Function Tests: No results for input(s): "TSH", "T4TOTAL", "FREET4", "T3FREE", "THYROIDAB" in the last 72 hours.  Anemia Panel: No results for input(s): "VITAMINB12", "FOLATE", "FERRITIN", "TIBC", "IRON", "RETICCTPCT" in the last 72 hours.  Urine analysis:    Component Value Date/Time   COLORURINE YELLOW 11/11/2021 1239   APPEARANCEUR CLEAR 11/11/2021 1239   LABSPEC  1.018 11/11/2021 1239   PHURINE 6.0 11/11/2021 1239   GLUCOSEU >1,000 (A) 11/11/2021 1239   HGBUR NEGATIVE 11/11/2021 1239   BILIRUBINUR NEGATIVE 11/11/2021 1239   KETONESUR NEGATIVE 11/11/2021 1239   PROTEINUR TRACE (A) 11/11/2021 1239   UROBILINOGEN 0.2 06/13/2012 0325   NITRITE NEGATIVE 11/11/2021 1239   LEUKOCYTESUR NEGATIVE 11/11/2021 1239   Recent Results (from the past 240 hour(s))  Culture, blood (Routine x 2)     Status: None (Preliminary result)   Collection Time: 11/11/21 10:25 AM   Specimen: Left Antecubital; Blood  Result Value Ref Range Status   Specimen Description   Final    LEFT ANTECUBITAL BLOOD Performed at [facility], [address], [place], [place] [address]    Special Requests   Final    BOTTLES DRAWN AEROBIC AND ANAEROBIC Blood Culture results may not be optimal due to an excessive volume of blood received in culture bottles Performed at [facility], [address] [place], [place] [address]    Culture   Final    NO GROWTH 4 DAYS Performed at [facility], [address] [place], [place] [address]    Report Status PENDING  Incomplete  Culture, blood (Routine x 2)     Status: None (Preliminary result)   Collection Time: 11/11/21 10:25 AM   Specimen: Right Antecubital; Blood  Result Value Ref Range Status   Specimen Description   Final    RIGHT ANTECUBITAL BLOOD Performed at [facility], [address] [place], [place] [address]    Special Requests   Final    BOTTLES DRAWN AEROBIC AND ANAEROBIC Blood Culture adequate volume Performed at [facility], [address] [place], [place] [address]    Culture   Final    NO GROWTH 4 DAYS Performed at [facility], [address], [place], [place] [address]    Report Status PENDING  Incomplete       Family Communication: None at bedside, spoke with patient's daughter by phone at length  Disposition: Status is: Inpatient Remains  inpatient appropriate because: Was discharged, AVS printed, prescriptions completed but then started having bleeding at angio site. Pt going home alone, family worried about possibility of continued/rebleeding. Will monitor overnight.  Planned Discharge Destination: Home 8/21  Tyrone Nine, MD 11/15/2021 3:04 PM Page by [email or web address]

## 2021-11-15 NOTE — TOC Progression Note (Addendum)
Transition of Care (TOC) - Progression Note  Donn Pierini RN, BSN Transitions of Care Unit 4E- RN Case Manager See Treatment Team for direct phone #    Patient Details  Name: Wayne [name] MRN: [id number] Date of Birth: January 30, 1941  Transition of Care [facility] CM/SW Contact  Zenda Alpers, Lenn Sink, RN Phone Number: 11/15/2021, 2:56 PM  Clinical Narrative:    Pt was for d/c today- however developed bleeding so d/c to be held for today per bedside RN.  Orders placed for HHRN/PT/OT- CM in to see pt along with ex-wife and daughter at the bedside. Discussed HH and DME needs- Per family pt has DME- RW and BSC at home. List provided for [facility] choice- Per CMS guidelines from medicare.gov website with star ratings (copy placed in shadow chart), Ex-wife- [name]- requesting [facility] known [facility] for [facility] needs- they have selected [facility] as backup.   Address, phone # confirmed- pt has been set up with PCP at [facility]- noted appointment made for Sept. 26- wife and daughter agreeable to PCP arrangements.   Call made to [facility] w/ [name] for [facility] Referral- referral pending review.  1510- [facility] unable to accept referral at this time Reached out to [facility] as per family choice as backup- referral pending review- CM will f/u in am to see if [facility] able to accept.    Expected Discharge Plan: Home w Home Health Services Barriers to Discharge: Continued Medical Work up  Expected Discharge Plan and Services Expected Discharge Plan: Home w Home Health Services   Discharge Planning Services: CM Consult, Follow-up appt scheduled Post Acute Care Choice: Durable Medical Equipment, Home Health Living arrangements for the past 2 months: Single Family Home Expected Discharge Date: 11/15/21               DME Arranged: N/A (family states pt has RW and 3n1 at home) DME Agency: NA       HH Arranged: RN, PT, OT   Date HH Agency Contacted: 11/15/21 Time HH Agency  Contacted: 1455     Social Determinants of Health (SDOH) Interventions    Readmission Risk Interventions    11/12/2021    2:59 PM  Readmission Risk Prevention Plan  Post Dischage Appt Complete  Medication Screening Complete  Transportation Screening Complete

## 2021-11-15 NOTE — Evaluation (Signed)
Occupational Therapy Evaluation Patient Details Name: Wayne [name] MRN: [id number] DOB: 1940/12/16 Today's Date: 11/15/2021   History of Present Illness 81 y/o male presented to ED on 11/11/21 for swelling and ulcer on R foot x 5 days as well as AMS. MRI R foot showed fracture at base of 2nd toe proximal phalanx. S/p R LE angiogram on 8/18. PMH: CAD s/p stenting, T2DM, HTN   Clinical Impression   This 81 yo male admitted with above presents to acute OT with PLOF of being totally independent with basic ADLs and driving per his report and living on his own with h/o dementia. Currently he is setup/S-min A for basic ADLs (not including tub/shower transfers) and minguard-min A for ambulation with RW/without RW. He will continue to benefit from acute OT with follow up HHOT with 24/7 S/prn A recommended at least initially and then for family to decide if he needs it long term. We will continue to follow.      Recommendations for follow up therapy are one component of a multi-disciplinary discharge planning process, led by the attending physician.  Recommendations may be updated based on patient status, additional functional criteria and insurance authorization.   Follow Up Recommendations  Home health OT    Assistance Recommended at Discharge Frequent or constant Supervision/Assistance  Patient can return home with the following A little help with walking and/or transfers;A little help with bathing/dressing/bathroom;Help with stairs or ramp for entrance;Assistance with cooking/housework;Assist for transportation;Direct supervision/assist for financial management;Direct supervision/assist for medications management    Functional Status Assessment  Patient has had a recent decline in their functional status and demonstrates the ability to make significant improvements in function in a reasonable and predictable amount of time.  Equipment Recommendations  Other (comment) (none, per family in room  they bought everything)       Precautions / Restrictions Precautions Precautions: Fall Required Braces or Orthoses: Other Brace Other Brace: post op shoe Restrictions Weight Bearing Restrictions: Yes RLE Weight Bearing: Weight bearing as tolerated Other Position/Activity Restrictions: in post op shoe      Mobility Bed Mobility Overal bed mobility: Modified Independent                  Transfers Overall transfer level: Needs assistance Equipment used: None Transfers: Sit to/from Stand Sit to Stand: Min guard                  Balance Overall balance assessment: Needs assistance Sitting-balance support: No upper extremity supported, Feet supported Sitting balance-Leahy Scale: Good     Standing balance support: Single extremity supported Standing balance-Leahy Scale: Poor                             ADL either performed or assessed with clinical judgement   ADL Overall ADL's : Needs assistance/impaired Eating/Feeding: Independent;Sitting   Grooming: Min guard;Standing   Upper Body Bathing: Set up;Supervision/ safety;Sitting   Lower Body Bathing: Min guard;Sit to/from stand   Upper Body Dressing : Set up;Supervision/safety;Sitting   Lower Body Dressing: Min guard;Sit to/from stand   Toilet Transfer: Min guard;Ambulation;Rolling walker (2 wheels);Minimal assistance Toilet Transfer Details (indicate cue type and reason): min guard A with RW simulated bed>50 feet with RW> 50 feet without RW returning to room min A Toileting- Clothing Manipulation and Hygiene: Min guard;Sit to/from stand               Vision Baseline Vision/History: 0 No visual deficits (  supposse to wear glasses for driving, but does not have) Patient Visual Report: No change from baseline              Pertinent Vitals/Pain Pain Assessment Pain Assessment: No/denies pain     Hand Dominance Right   Extremity/Trunk Assessment Upper Extremity Assessment Upper  Extremity Assessment: Overall WFL for tasks assessed           Communication Communication Communication: HOH (no hearing aids)   Cognition Arousal/Alertness: Awake/alert Behavior During Therapy: WFL for tasks assessed/performed Overall Cognitive Status: History of cognitive impairments - at baseline                                 General Comments: per daughter, 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 Seems generally confused in conversation and demos poor safety awareness. Keeps repeating same question about if his wound bleeds at home.                Home Living Family/patient expects to be discharged to:: Private residence Living Arrangements: Alone Available Help at Discharge: Friend(s);Family;Available PRN/intermittently Type of Home: House Home Access: Stairs to enter [employer] of Steps: 5 Entrance Stairs-Rails: Right Home Layout: One level     Bathroom Shower/Tub: Walk-in shower;Door   Bathroom Toilet: Handicapped height     Home Equipment: Grab bars - tub/shower;Hand held shower head;Shower Counsellor (2 wheels);BSC/3in1          Prior Functioning/Environment Prior Level of Function : Independent/Modified Independent;Driving             Mobility Comments: x1 fall in past month (using bathroom holding onto shower rod and lost grip and fell to the side)          OT Problem List: Impaired balance (sitting and/or standing);Decreased cognition;Decreased safety awareness      OT Treatment/Interventions: Self-care/ADL training;DME and/or AE instruction;Patient/family education;Balance training    OT Goals(Current goals can be found in the care plan section) Acute Rehab OT Goals Patient Stated Goal: to go home today OT Goal Formulation: With patient Time For Goal Achievement: 11/29/21 Potential to Achieve Goals: Good  OT Frequency: Min 2X/week        AM-PAC OT "6 Clicks" Daily Activity     Outcome Measure Help from another person eating meals?: None Help from another person taking care of personal grooming?: A Little Help from another person toileting, which includes using toliet, bedpan, or urinal?: A Little Help from another person bathing (including washing, rinsing, drying)?: A Little Help from another person to put on and taking off regular upper body clothing?: A Little Help from another person to put on and taking off regular lower body clothing?: A Little 6 Click Score: 19   End of Session Equipment Utilized During Treatment: Gait belt;Rolling walker (2 wheels) Nurse Communication:  (left groin wound bleeding')  Activity Tolerance: Patient tolerated treatment well Patient left: in bed;with call bell/phone within reach;with bed alarm set;with family/visitor present  OT Visit Diagnosis: Unsteadiness on feet (R26.81);Other abnormalities of gait and mobility (R26.89);Muscle weakness (generalized) (M62.81);Other symptoms and signs involving cognitive function                Time: 1309-1400 OT Time Calculation (min): 51 min Charges:  OT General Charges $OT Visit: 1 Visit OT Evaluation $OT Eval Moderate Complexity: 1 Mod OT Treatments $Self Care/Home Management : 23-37 mins  Ignacia Palma, OTR/L Acute  [facility] [phone] Office [phone]    Evette Georges 11/15/2021, 2:48 PM

## 2021-11-15 NOTE — Progress Notes (Signed)
Physical Therapy Treatment Patient Details Name: Wayne [name] MRN: [id number] DOB: 11-14-1940 Today's Date: 11/15/2021   History of Present Illness 81 y/o male presented to ED on 11/11/21 for swelling and ulcer on R foot x 5 days as well as AMS. MRI R foot showed fracture at base of 2nd toe proximal phalanx. S/p R LE angiogram on 8/18. PMH: CAD s/p stenting, T2DM, HTN    PT Comments    Patient progressing with ambulation with RW.  Needed reminders why he needed to use it (post op shoe on R foot).  Was too fatigued with ambulation to make it to stairs.  Discussed stair method for home entry and pt verbalized understanding.  Continue to feel he will benefit from HHPT at d/c and from initial 24 hour assist from family for safety.     Recommendations for follow up therapy are one component of a multi-disciplinary discharge planning process, led by the attending physician.  Recommendations may be updated based on patient status, additional functional criteria and insurance authorization.  Follow Up Recommendations  Home health PT     Assistance Recommended at Discharge Frequent or constant Supervision/Assistance  Patient can return home with the following A little help with walking and/or transfers;A little help with bathing/dressing/bathroom;Assistance with cooking/housework;Direct supervision/assist for medications management;Direct supervision/assist for financial management;Assist for transportation;Help with stairs or ramp for entrance   Equipment Recommendations  Rolling walker (2 wheels);BSC/3in1    Recommendations for Other Services       Precautions / Restrictions Precautions Precautions: Fall Required Braces or Orthoses: Other Brace Other Brace: post op shoe Restrictions Weight Bearing Restrictions: Yes RLE Weight Bearing: Weight bearing as tolerated Other Position/Activity Restrictions: in post op shoe     Mobility  Bed Mobility Overal bed mobility: Modified  Independent                  Transfers   Equipment used: Rolling walker (2 wheels) Transfers: Sit to/from Stand Sit to Stand: Supervision           General transfer comment: cues for hand placement    Ambulation/Gait Ambulation/Gait assistance: Supervision, Min guard Gait Distance (Feet): 130 Feet Assistive device: Rolling walker (2 wheels) Gait Pattern/deviations: Step-through pattern, Decreased stride length       General Gait Details: wearing post-op shoe on R and his shoes on L; mild instability with difference of footwear   Stairs Stairs:  (planned to try stairs, but pt c/o fatigue and needed to return to room)           Wheelchair Mobility    Modified Rankin (Stroke Patients Only)       Balance Overall balance assessment: Needs assistance Sitting-balance support: No upper extremity supported Sitting balance-Leahy Scale: Good     Standing balance support: Single extremity supported Standing balance-Leahy Scale: Poor                              Cognition   Behavior During Therapy: WFL for tasks assessed/performed Overall Cognitive Status: History of cognitive impairments - at baseline                                 General Comments: knew he was to go home today, not wanting food as he "hates hospital food"        Exercises      General Comments General comments (skin  integrity, edema, etc.): noted second toe on R foot red throughout      Pertinent Vitals/Pain Pain Assessment Pain Assessment: No/denies pain    Home Living Family/patient expects to be discharged to:: Private residence Living Arrangements: Alone Available Help at Discharge: Friend(s);Family;Available PRN/intermittently Type of Home: House Home Access: Stairs to enter Entrance Stairs-Rails: Right Entrance Stairs-Number of Steps: 5   Home Layout: One level Home Equipment: Grab bars - tub/shower;Hand held shower head;Shower  Counsellor (2 wheels);BSC/3in1      Prior Function            PT Goals (current goals can now be found in the care plan section) Progress towards PT goals: Progressing toward goals    Frequency    Min 3X/week      PT Plan Current plan remains appropriate    Co-evaluation              AM-PAC PT "6 Clicks" Mobility   Outcome Measure  Help needed turning from your back to your side while in a flat bed without using bedrails?: None Help needed moving from lying on your back to sitting on the side of a flat bed without using bedrails?: None Help needed moving to and from a bed to a chair (including a wheelchair)?: A Little Help needed standing up from a chair using your arms (e.g., wheelchair or bedside chair)?: A Little Help needed to walk in hospital room?: A Little Help needed climbing 3-5 steps with a railing? : A Little 6 Click Score: 20    End of Session Equipment Utilized During Treatment: Gait belt Activity Tolerance: Patient limited by fatigue Patient left: in bed;with call bell/phone within reach   PT Visit Diagnosis: Unsteadiness on feet (R26.81);Muscle weakness (generalized) (M62.81);Difficulty in walking, not elsewhere classified (R26.2)     Time: 3500-9381 PT Time Calculation (min) (ACUTE ONLY): 29 min  Charges:  $Gait Training: 8-22 mins $Therapeutic Activity: 8-22 mins                     Sheran Lawless, PT Acute Rehabilitation Services Office:[phone] 11/15/2021    Elray Mcgregor 11/15/2021, 3:17 PM

## 2021-11-16 ENCOUNTER — Ambulatory Visit: Payer: Medicare Other | Admitting: Podiatry

## 2021-11-16 ENCOUNTER — Encounter (HOSPITAL_COMMUNITY): Payer: Self-pay | Admitting: Vascular Surgery

## 2021-11-16 ENCOUNTER — Other Ambulatory Visit (HOSPITAL_BASED_OUTPATIENT_CLINIC_OR_DEPARTMENT_OTHER): Payer: Self-pay

## 2021-11-16 ENCOUNTER — Other Ambulatory Visit (HOSPITAL_COMMUNITY): Payer: Self-pay

## 2021-11-16 LAB — CULTURE, BLOOD (ROUTINE X 2)
Culture: NO GROWTH
Special Requests: ADEQUATE

## 2021-11-16 LAB — GLUCOSE, CAPILLARY
Glucose-Capillary: 152 mg/dL — ABNORMAL HIGH (ref 70–99)
Glucose-Capillary: 232 mg/dL — ABNORMAL HIGH (ref 70–99)

## 2021-11-16 MED ORDER — GLIPIZIDE 5 MG PO TABS
2.5000 mg | ORAL_TABLET | Freq: Every day | ORAL | 0 refills | Status: DC
  Filled 2021-11-16: qty 30, 60d supply, fill #0

## 2021-11-16 MED ORDER — CEPHALEXIN 500 MG PO CAPS
500.0000 mg | ORAL_CAPSULE | Freq: Two times a day (BID) | ORAL | 0 refills | Status: DC
  Filled 2021-11-16: qty 14, 7d supply, fill #0

## 2021-11-16 MED ORDER — BLOOD GLUCOSE MONITOR KIT
PACK | 0 refills | Status: DC
  Filled 2021-11-16: qty 1, 30d supply, fill #0

## 2021-11-16 MED ORDER — LINAGLIPTIN 5 MG PO TABS
5.0000 mg | ORAL_TABLET | Freq: Every day | ORAL | 1 refills | Status: DC
  Filled 2021-11-16 – 2021-12-07 (×2): qty 30, 30d supply, fill #0

## 2021-11-16 MED ORDER — ACCU-CHEK SOFTCLIX LANCETS MISC
0 refills | Status: DC
  Filled 2021-11-16: qty 100, 25d supply, fill #0

## 2021-11-16 MED ORDER — LISINOPRIL 10 MG PO TABS
10.0000 mg | ORAL_TABLET | Freq: Every day | ORAL | 1 refills | Status: DC
  Filled 2021-11-16 – 2021-12-07 (×2): qty 30, 30d supply, fill #0

## 2021-11-16 MED ORDER — ATORVASTATIN CALCIUM 40 MG PO TABS
40.0000 mg | ORAL_TABLET | Freq: Every day | ORAL | 1 refills | Status: DC
  Filled 2021-11-16 – 2021-12-07 (×2): qty 30, 30d supply, fill #0

## 2021-11-16 MED ORDER — GLUCOSE BLOOD VI STRP
ORAL_STRIP | 0 refills | Status: DC
  Filled 2021-11-16: qty 100, 25d supply, fill #0

## 2021-11-16 MED ORDER — MUPIROCIN 2 % EX OINT
TOPICAL_OINTMENT | Freq: Two times a day (BID) | CUTANEOUS | Status: DC
  Filled 2021-11-16: qty 22

## 2021-11-16 MED ORDER — ASPIRIN 81 MG PO TBEC
81.0000 mg | DELAYED_RELEASE_TABLET | Freq: Every day | ORAL | 1 refills | Status: DC
  Filled 2021-11-16 – 2021-12-07 (×2): qty 30, 30d supply, fill #0

## 2021-11-16 NOTE — TOC Transition Note (Addendum)
Transition of Care (TOC) - CM/SW Discharge Note Donn Pierini RN, BSN Transitions of Care Unit 4E- RN Case Manager See Treatment Team for direct phone #    Patient Details  Name: Wayne [name] MRN: [id number] Date of Birth: Mar 10, 1941  Transition of Care [facility]) CM/SW Contact:  Darrold Span, RN Phone Number: 11/16/2021, 11:04 AM   Clinical Narrative:    Pt stable for transition home today,  F/u done w/ [facility] regarding [facility] referral- unfortunately they are unable to accept due to low staffing in nursing for pt's area.  CM spoke with pt at bedside to discuss [facility]- explained that first choices for [facility] were unable to accept- offered any additional preferences to reach out to - per pt he does not have any additional preference- CM offered to call his family - pt declined and states he will defer to this CM to secure an agency on his behalf that can provide needed services.  Call made to [facility]- referral reviewed- and they also declined referral due to staffing in pt's area for nursing.  Review of HH calls: [facility]- declined referral [facility]- declined referral [facility]- declined referral  Call made to [facility]- spoke with [facility]- referral has been accepted by [facility] for [facility] needs- RN/PT/OT.  [facility]- referral accepted CM updated pt at bedside- pt agreeable to [facility] arrangements w/ [facility]  Final next level of care: Home w Home Health Services Barriers to Discharge: Barriers Resolved   Patient Goals and CMS Choice Patient states their goals for this hospitalization and ongoing recovery are:: To return home CMS Medicare.gov Compare Post Acute Care list provided to:: Patient Choice offered to / list presented to : Patient  Discharge Placement                 Home w/ [facility]      Discharge Plan and Services   Discharge Planning Services: CM Consult, Follow-up appt scheduled Post Acute Care Choice: Durable Medical Equipment, Home Health          DME Arranged: N/A  (family states pt has RW and 3n1 at home) DME Agency: NA       HH Arranged: RN, PT, OT HH Agency: [facility] Date [facility] Agency Contacted: 11/16/21 Time HH Agency Contacted: 1104 Representative spoke with at [facility] Agency: Kandee Keen  Social Determinants of Health (SDOH) Interventions     Readmission Risk Interventions    11/12/2021    2:59 PM  Readmission Risk Prevention Plan  Post Dischage Appt Complete  Medication Screening Complete  Transportation Screening Complete

## 2021-11-16 NOTE — Care Management Important Message (Signed)
Important Message  Patient Details  Name: Wayne [name] MRN: [id number] Date of Birth: 10/27/1940   Medicare Important Message Given:  Yes     Renie Ora 11/16/2021, 8:19 AM

## 2021-11-16 NOTE — Progress Notes (Signed)
Subjective: No new concerns.   Objective: NAD Erythema to the dorsal aspect of foot continues to improve however still evident.  Old blister to the second toe.  Color seems to be improving to the toe.  See pictures below.  There is no fluctuation recommendation for there is no malodor. No pain with calf compression, swelling, warmth, erythema       Assessment: PAD with resolving cellulitis; second toe fracture right foot  Plan: -Continue local wound care and broad-spectrum antibiotics for now. -Surgical shoe for offloading.  Weight-bear as tolerated in surgical shoe -Blood cultures pending. -From podiatry standpoint able to be discharged once medically stable with oral antibiotics and close follow-up.  Follow-up in the office 1 week after discharge. Podiatry to sign off. Please let me know if we can be of further assistance.  Ovid Curd, DPM

## 2021-11-16 NOTE — Progress Notes (Signed)
Physical Therapy Treatment Patient Details Name: Wayne [name] MRN: [id number] DOB: [birth year] Today's Date: 11/16/2021   History of Present Illness Pt is an 81 y.o. male admitted 11/11/21 for AMS, R foot ulcer and swelling for 5 days. R foot MRI showed fx at base of 2nd toe proximal phalanx; plan for medical management (WBAT in surgical shoe). S/p RLE angiogram on 8/18 revealing PAD without intervention. PMH includes CAD s/p stent, DM2, HTN.   PT Comments    Pt progressing with mobility. Today's session focused on transfer and gait training; pt requires cues for use of R surgical shoe and RW, but recognizes improvements in stability with RW use; pt agreeable to use RW at home. Pt remains limited by generalized weakness, decreased activity tolerance, and impaired balance strategies/postural reactions. Continue to recommend HHPT services to maximize functional mobility and independence upon return home.    Recommendations for follow up therapy are one component of a multi-disciplinary discharge planning process, led by the attending physician.  Recommendations may be updated based on patient status, additional functional criteria and insurance authorization.  Follow Up Recommendations  Home health PT     Assistance Recommended at Discharge Frequent or constant Supervision/Assistance  Patient can return home with the following A little help with walking and/or transfers;A little help with bathing/dressing/bathroom;Assistance with cooking/housework;Direct supervision/assist for medications management;Direct supervision/assist for financial management;Assist for transportation;Help with stairs or ramp for entrance   Equipment Recommendations  Rolling walker (2 wheels);BSC/3in1    Recommendations for Other Services  Mobility Specialist     Precautions / Restrictions Precautions Precautions: Fall Required Braces or Orthoses: Other Brace Other Brace: R foot post-op  shoe Restrictions Weight Bearing Restrictions: Yes RLE Weight Bearing: Weight bearing as tolerated Other Position/Activity Restrictions: in post op shoe     Mobility  Bed Mobility Overal bed mobility: Modified Independent                  Transfers Overall transfer level: Needs assistance Equipment used: None, Rolling walker (2 wheels) Transfers: Sit to/from Stand Sit to Stand: Supervision           General transfer comment: pt attempting without DME, cues on need for walker    Ambulation/Gait Ambulation/Gait assistance: Supervision, Min guard Gait Distance (Feet): 200 Feet (+ 200') Assistive device: None, Rolling walker (2 wheels) Gait Pattern/deviations: Step-through pattern, Decreased stride length Gait velocity: Decreased     General Gait Details: pt reports not planning to use RW at home, taking a few steps to sink reaching to furniture for UE support, pt in agreement on need for walker; stability improved with RW, min guard progressing to supervision; cues for upright posture and proximity to RW; 1x seated rest break secondary to fatigue   Stairs Stairs:  (pt declined stair training; educ on sequencing and safety ascending 10 steps into home with rail support; daughter to carry RW up for him)           Wheelchair Mobility    Modified Rankin (Stroke Patients Only)       Balance Overall balance assessment: Needs assistance Sitting-balance support: No upper extremity supported Sitting balance-Leahy Scale: Good Sitting balance - Comments: able to don bilateral shoes - though pt did not tie prior to standing   Standing balance support: No upper extremity supported, Single extremity supported Standing balance-Leahy Scale: Fair Standing balance comment: can static stand without DME, stability improved with RW  Cognition Arousal/Alertness: Awake/alert Behavior During Therapy: WFL for tasks  assessed/performed Overall Cognitive Status: History of cognitive impairments - at baseline                                 General Comments: donned shoes, but leaving R post-op shoe not velcro'd and L shoe untied with standing and starting to walk        Exercises      General Comments General comments (skin integrity, edema, etc.): SpO2 91-94% on RA, HR 80s      Pertinent Vitals/Pain Pain Assessment Pain Assessment: No/denies pain    Home Living Family/patient expects to be discharged to:: Private residence                        Prior Function            PT Goals (current goals can now be found in the care plan section) Progress towards PT goals: Progressing toward goals    Frequency    Min 3X/week      PT Plan Current plan remains appropriate    Co-evaluation              AM-PAC PT "6 Clicks" Mobility   Outcome Measure  Help needed turning from your back to your side while in a flat bed without using bedrails?: None Help needed moving from lying on your back to sitting on the side of a flat bed without using bedrails?: None Help needed moving to and from a bed to a chair (including a wheelchair)?: A Little Help needed standing up from a chair using your arms (e.g., wheelchair or bedside chair)?: A Little Help needed to walk in hospital room?: A Little Help needed climbing 3-5 steps with a railing? : A Little 6 Click Score: 20    End of Session Equipment Utilized During Treatment: Gait belt Activity Tolerance: Patient tolerated treatment well Patient left: in bed;with call bell/phone within reach;with bed alarm set (seated EOB) Nurse Communication: Mobility status PT Visit Diagnosis: Unsteadiness on feet (R26.81);Muscle weakness (generalized) (M62.81);Difficulty in walking, not elsewhere classified (R26.2)     Time: 1607-3710 PT Time Calculation (min) (ACUTE ONLY): 18 min  Charges:  $Gait Training: 8-22 mins                      Ina Homes, PT, DPT Acute Rehabilitation Services  Personal: Secure Chat Rehab Office: [phone]  Malachy Chamber 11/16/2021, 9:53 AM

## 2021-11-16 NOTE — Progress Notes (Signed)
Pharmacy Antibiotic Note  Wayne [name] is a 81 y.o. male admitted on 11/11/2021 with sepsis and cellulitis.  Pharmacy has been consulted for vancomycin and cefepime dosing.   Vancomycin stopped over the weekend, pt with planned discharge today on cephalexin.  Plan: Cefepime 2gm IV Q12H   Height: 6' (182.9 cm) Weight: 85.5 kg (188 lb 6.4 oz) IBW/kg (Calculated) : 77.6  Temp (24hrs), Avg:98 F (36.7 C), Min:97.8 F (36.6 C), Max:98.1 F (36.7 C)  Recent Labs  Lab 11/11/21 1025 11/11/21 2009 11/11/21 2155 11/12/21 0015 11/12/21 0247 11/13/21 0550 11/13/21 1337 11/14/21 0151 11/15/21 0339  WBC 8.8  --  8.4 8.7  --   --   --   --   --   CREATININE 1.48*  --  1.36* 1.29*  --  1.59*  --  1.51* 1.44*  LATICACIDVEN 1.2 3.0*  --  2.4* 1.7  --   --   --   --   VANCOTROUGH  --   --   --   --   --   --  9*  --   --      Estimated Creatinine Clearance: 43.4 mL/min (A) (by C-G formula based on SCr of 1.44 mg/dL (H)).    No Known Allergies  Antimicrobials this admission: Vanc 8/16>>8/18 Cefepime 8/16>> Flagyl x 1 8/16  Dose adjustments this admission: VT 9 on 8/18: reduce vanc to 1g IV every 24 hours   Microbiology results: Bcx 8/16: ngtd   Thank you for allowing pharmacy to be a part of this patient's care.  Fredonia Highland, PharmD, BCPS, [facility] Clinical Pharmacist [phone] Please check AMION for all [facility] numbers 11/16/2021

## 2021-11-16 NOTE — Consult Note (Signed)
WOC Nurse Consult Note: Reason for Consult:Vascular wound to right foot.  History CAD with stenting.  Hypertension and type 2 DM.  Redness, swelling blister to right  dorsal foot and second toe. Peeling  epithelium to plantar foot at second toe. Has fracture to second right toe. Surgical offloading shoe was ordered.  Podiatry has consulted.  Will follow up in office outpatient as well.  Blood cultures pending.  Patient lives alone, will make recommendations for wound care being mindful of that. Wound type:infectious, neuropathic, vascular  Pressure Injury POA: NA Measurement: TOe:  2.4 cm x 1.4 cm x 0.1 cm ruptured blister Wound AYO:[id number] and moist Drainage (amount, consistency, odor) minimal serosanguinous  no odor.  Periwound:peeling epithelium, resolving erythema.  Improvement noted via line of demarcation that was drawn on admission.  Dressing procedure/placement/frequency: Cleanse right toe wound with soap and water and pat dry. Apply mupirocin ointment twice daily and cover with dry dressing.  Keep feet clean and dry.  Offloading shoe as ordered.   Will not follow at this time.  Please re-consult if needed.  Maple Hudson MSN, RN, FNP-BC CWON Wound, Ostomy, Continence Nurse Pager [phone]

## 2021-11-17 ENCOUNTER — Ambulatory Visit: Payer: Medicare Other | Admitting: Podiatry

## 2021-11-17 DIAGNOSIS — S92534D Nondisplaced fracture of distal phalanx of right lesser toe(s), subsequent encounter for fracture with routine healing: Secondary | ICD-10-CM | POA: Diagnosis not present

## 2021-11-17 DIAGNOSIS — L03115 Cellulitis of right lower limb: Secondary | ICD-10-CM | POA: Diagnosis not present

## 2021-11-17 DIAGNOSIS — I739 Peripheral vascular disease, unspecified: Secondary | ICD-10-CM

## 2021-11-17 MED ORDER — NITROGLYCERIN 0.2 MG/HR TD PT24: 0.2000 mg | MEDICATED_PATCH | Freq: Every day | TRANSDERMAL | 0 refills | Status: DC

## 2021-11-18 ENCOUNTER — Telehealth: Payer: Self-pay | Admitting: *Deleted

## 2021-11-18 NOTE — Telephone Encounter (Addendum)
Patient's wife is calling because husband's foot is more swollen,crusty than before, has a black spot that seems larger , very blotted,constipated. She is very concerned. Please advise. Returned the call back to patient,no answer, left message for call back for a little more information on the condition of the foot.

## 2021-11-18 NOTE — Progress Notes (Signed)
Subjective Chief Complaint  Patient presents with   Foot Injury    Right foot 2nd toe fracture, started 2 weeks just left the hospital yesterday, patient denies any pain, Changing drainage every day,  Keflex  A1c- 7.0 BG- 39     81 year old male presents the office for post-hospital discharge for follow-up evaluation of fracture of the right second toe, cellulitis, PAD.  Denies any fevers or chills.  He has no pain.  He has no other concerns today.  Objective: AAO x3, NAD Neurovascular status unchanged.  There is edema and erythema to the dorsal aspect of the foot no pain but no significant increase in temperature.  There is no areas of fluctuance or crepitation.  There is macerated tissue along the second and third interspaces.  There is old blister formation on the sulcus of the toe the skin is peeling.  There is some granulation tissue present dorsal aspect the toe and there is a small necrotic area starting on the second toe which is new compared to when I saw him last in the hospital over the weekend.  With calf compression, swelling, warmth, erythema   Assessment: Right second toe fracture, PAD  Plan: -Presents today with family member concerned about the discoloration of the toe however he is recently undergone vascular intervention.  There is macerated tissue and due to this I applied a small mount of Betadine to keep the area clean and dry.  There is only 1 small area of necrotic tissue which was started.  There is been some swelling but is also been more.  Encouraged elevation to help with the swelling as well.  Continue antibiotics. -Monitor for any clinical signs or symptoms of infection and directed to call the office immediately should any occur or go to the ER.  Return in about 1 week (around 11/24/2021).  Vivi Barrack DPM

## 2021-11-19 ENCOUNTER — Telehealth: Payer: Self-pay | Admitting: *Deleted

## 2021-11-19 NOTE — Telephone Encounter (Signed)
scheduled

## 2021-11-19 NOTE — Telephone Encounter (Addendum)
Wayne [name] w/ [facility] is calling because patient's wife is very concerned about his foot. They will go out for a visit tomorrow if our physician will give the ok and sign a order since patient does not have a PCP as of yet.  Please advise or call for verbal at : [phone].

## 2021-11-19 NOTE — Telephone Encounter (Signed)
Scheduled 08/25

## 2021-11-19 NOTE — Telephone Encounter (Signed)
Called [place] w/ [facility] giving verbal orders per physician, verbalized understanding,if patient is scheduled today or tomorrow will f/u over the weekend.

## 2021-11-20 ENCOUNTER — Encounter: Payer: Self-pay | Admitting: Podiatrist

## 2021-11-20 ENCOUNTER — Ambulatory Visit: Payer: Medicare Other | Admitting: Podiatrist

## 2021-11-20 DIAGNOSIS — L03115 Cellulitis of right lower limb: Secondary | ICD-10-CM

## 2021-11-20 DIAGNOSIS — S92534D Nondisplaced fracture of distal phalanx of right lesser toe(s), subsequent encounter for fracture with routine healing: Secondary | ICD-10-CM | POA: Diagnosis not present

## 2021-11-20 DIAGNOSIS — I739 Peripheral vascular disease, unspecified: Secondary | ICD-10-CM

## 2021-11-20 MED ORDER — CEPHALEXIN 500 MG PO CAPS
500.0000 mg | ORAL_CAPSULE | Freq: Two times a day (BID) | ORAL | 0 refills | Status: DC
Start: 2021-11-20 — End: 2021-12-04

## 2021-11-20 NOTE — Progress Notes (Signed)
Chief Complaint  Patient presents with   Follow-up    post-hospital discharge follow-up fracture of the 2nd toe, cellulitis right foot.  Patent is taking Keflex BID for cellulitis. Family member believes it is not improving.      HPI: Patient is 81 y.o. male who presents today for follow-up infection right foot-second toe.  In the hospital he had an MRI done and was diagnosed with a fracture of the base of the second proximal phalanx.  His greater concern is the darkened appearance of the right second toe and redness on the top of the right foot.  He was admitted to the hospital and put on IV antibiotics and was discharged 3 days ago.  Since then he has been on oral Keflex twice daily.  The patient's family is concerned that the foot has started to worsen since he has been discharged.  No Known Allergies  Review of systems is negative except as noted in the HPI.  Denies nausea/ vomiting/ fevers/ chills or night sweats.   Denies difficulty breathing, denies calf pain or tenderness  Physical Exam  Objective: AAO x3, NAD Nonpalpable pedal pulses to the right foot are noted.  Capillary refill time to the distal second toe is delayed but present.  There is edema and erythema to the dorsal aspect of the foot no pain.  Some calor is present.  There is no areas of fluctuance or crepitation.  Improvement in appearance of macerated tissue along the second and third interspaces.  There is some peeling skin on the first interspace which wraps around the second toe plantarly.  The dorsal aspect the second toe has an area of dried eschar present.  Overall appearance of the integument of the toe is that it has dried out compared to the previous visits.      Assessment:   ICD-10-CM   1. PAD (peripheral artery disease) (HCC)  I73.9     2. Cellulitis of right foot  L03.115     3. Closed nondisplaced fracture of distal phalanx of lesser toe of right foot with routine healing, subsequent encounter  S92.534D         Plan: Discussed with the patient and his family as well as with Dr. Ardelle Anton.  We agreed that overall the foot is stable with the current treatment plan.  At today's visit I applied Silvadene cream to the toe itself as well as gauze in between the first second third and fourth interspaces and a light dressing was applied.  We gave specific instructions for the patient's wife to apply a small amount of mupirocin ointment to the right second toe and to apply gauze dressing over top very lightly.  I marked proximal line of demarcation of erythema on the dorsum of the foot with a sharpie.  Recommended they call if the redness passes this line.  We wrote a prescription for Keflex twice daily.  He will be seen back on Tuesday for recheck with Dr. Ardelle Anton.

## 2021-11-20 NOTE — Telephone Encounter (Signed)
Appt has been scheduled.

## 2021-11-23 ENCOUNTER — Other Ambulatory Visit (HOSPITAL_BASED_OUTPATIENT_CLINIC_OR_DEPARTMENT_OTHER): Payer: Self-pay

## 2021-11-24 ENCOUNTER — Other Ambulatory Visit (HOSPITAL_BASED_OUTPATIENT_CLINIC_OR_DEPARTMENT_OTHER): Payer: Self-pay

## 2021-11-24 ENCOUNTER — Ambulatory Visit: Payer: Medicare Other | Admitting: Podiatry

## 2021-11-24 DIAGNOSIS — L03115 Cellulitis of right lower limb: Secondary | ICD-10-CM | POA: Diagnosis not present

## 2021-11-24 DIAGNOSIS — I739 Peripheral vascular disease, unspecified: Secondary | ICD-10-CM | POA: Diagnosis not present

## 2021-11-24 DIAGNOSIS — S92534D Nondisplaced fracture of distal phalanx of right lesser toe(s), subsequent encounter for fracture with routine healing: Secondary | ICD-10-CM

## 2021-11-28 ENCOUNTER — Encounter (HOSPITAL_COMMUNITY): Payer: Self-pay

## 2021-11-28 ENCOUNTER — Emergency Department (HOSPITAL_COMMUNITY): Payer: Medicare Other

## 2021-11-28 ENCOUNTER — Inpatient Hospital Stay (HOSPITAL_COMMUNITY): Payer: Medicare Other

## 2021-11-28 ENCOUNTER — Inpatient Hospital Stay (HOSPITAL_COMMUNITY)
Admission: EM | Admit: 2021-11-28 | Discharge: 2021-12-04 | DRG: 193 | Disposition: A | Discharge status: Skilled Nursing Facility | Payer: Medicare Other | Attending: Internal Medicine | Admitting: Internal Medicine

## 2021-11-28 ENCOUNTER — Other Ambulatory Visit: Payer: Self-pay

## 2021-11-28 DIAGNOSIS — D638 Anemia in other chronic diseases classified elsewhere: Secondary | ICD-10-CM | POA: Diagnosis present

## 2021-11-28 DIAGNOSIS — Z7982 Long term (current) use of aspirin: Secondary | ICD-10-CM

## 2021-11-28 DIAGNOSIS — D696 Thrombocytopenia, unspecified: Secondary | ICD-10-CM | POA: Diagnosis present

## 2021-11-28 DIAGNOSIS — W19XXXA Unspecified fall, initial encounter: Secondary | ICD-10-CM | POA: Diagnosis not present

## 2021-11-28 DIAGNOSIS — I130 Hypertensive heart and chronic kidney disease with heart failure and stage 1 through stage 4 chronic kidney disease, or unspecified chronic kidney disease: Secondary | ICD-10-CM | POA: Diagnosis present

## 2021-11-28 DIAGNOSIS — Z66 Do not resuscitate: Secondary | ICD-10-CM | POA: Diagnosis not present

## 2021-11-28 DIAGNOSIS — N1831 Chronic kidney disease, stage 3a: Secondary | ICD-10-CM

## 2021-11-28 DIAGNOSIS — R627 Adult failure to thrive: Secondary | ICD-10-CM | POA: Diagnosis not present

## 2021-11-28 DIAGNOSIS — E538 Deficiency of other specified B group vitamins: Secondary | ICD-10-CM | POA: Diagnosis present

## 2021-11-28 DIAGNOSIS — J9601 Acute respiratory failure with hypoxia: Secondary | ICD-10-CM | POA: Diagnosis present

## 2021-11-28 DIAGNOSIS — E1151 Type 2 diabetes mellitus with diabetic peripheral angiopathy without gangrene: Secondary | ICD-10-CM | POA: Diagnosis present

## 2021-11-28 DIAGNOSIS — E785 Hyperlipidemia, unspecified: Secondary | ICD-10-CM | POA: Diagnosis present

## 2021-11-28 DIAGNOSIS — M25552 Pain in left hip: Secondary | ICD-10-CM | POA: Diagnosis not present

## 2021-11-28 DIAGNOSIS — Z79899 Other long term (current) drug therapy: Secondary | ICD-10-CM

## 2021-11-28 DIAGNOSIS — F0390 Unspecified dementia without behavioral disturbance: Secondary | ICD-10-CM | POA: Diagnosis present

## 2021-11-28 DIAGNOSIS — I429 Cardiomyopathy, unspecified: Secondary | ICD-10-CM | POA: Diagnosis present

## 2021-11-28 DIAGNOSIS — E1165 Type 2 diabetes mellitus with hyperglycemia: Secondary | ICD-10-CM | POA: Diagnosis present

## 2021-11-28 DIAGNOSIS — I493 Ventricular premature depolarization: Secondary | ICD-10-CM | POA: Diagnosis present

## 2021-11-28 DIAGNOSIS — I5021 Acute systolic (congestive) heart failure: Secondary | ICD-10-CM | POA: Diagnosis present

## 2021-11-28 DIAGNOSIS — K5900 Constipation, unspecified: Secondary | ICD-10-CM | POA: Diagnosis present

## 2021-11-28 DIAGNOSIS — I5031 Acute diastolic (congestive) heart failure: Secondary | ICD-10-CM | POA: Diagnosis not present

## 2021-11-28 DIAGNOSIS — Z20822 Contact with and (suspected) exposure to covid-19: Secondary | ICD-10-CM | POA: Diagnosis present

## 2021-11-28 DIAGNOSIS — R54 Age-related physical debility: Secondary | ICD-10-CM | POA: Diagnosis not present

## 2021-11-28 DIAGNOSIS — E1122 Type 2 diabetes mellitus with diabetic chronic kidney disease: Secondary | ICD-10-CM | POA: Diagnosis present

## 2021-11-28 DIAGNOSIS — M4856XA Collapsed vertebra, not elsewhere classified, lumbar region, initial encounter for fracture: Secondary | ICD-10-CM | POA: Diagnosis present

## 2021-11-28 DIAGNOSIS — I272 Pulmonary hypertension, unspecified: Secondary | ICD-10-CM | POA: Diagnosis present

## 2021-11-28 DIAGNOSIS — Z515 Encounter for palliative care: Secondary | ICD-10-CM | POA: Diagnosis not present

## 2021-11-28 DIAGNOSIS — I70209 Unspecified atherosclerosis of native arteries of extremities, unspecified extremity: Secondary | ICD-10-CM | POA: Diagnosis present

## 2021-11-28 DIAGNOSIS — Z7189 Other specified counseling: Secondary | ICD-10-CM | POA: Diagnosis not present

## 2021-11-28 DIAGNOSIS — I2510 Atherosclerotic heart disease of native coronary artery without angina pectoris: Secondary | ICD-10-CM

## 2021-11-28 DIAGNOSIS — D519 Vitamin B12 deficiency anemia, unspecified: Secondary | ICD-10-CM | POA: Diagnosis present

## 2021-11-28 DIAGNOSIS — E1140 Type 2 diabetes mellitus with diabetic neuropathy, unspecified: Secondary | ICD-10-CM | POA: Diagnosis present

## 2021-11-28 DIAGNOSIS — E1142 Type 2 diabetes mellitus with diabetic polyneuropathy: Secondary | ICD-10-CM | POA: Diagnosis present

## 2021-11-28 DIAGNOSIS — L03115 Cellulitis of right lower limb: Secondary | ICD-10-CM | POA: Diagnosis present

## 2021-11-28 DIAGNOSIS — J189 Pneumonia, unspecified organism: Secondary | ICD-10-CM | POA: Diagnosis present

## 2021-11-28 DIAGNOSIS — S50311A Abrasion of right elbow, initial encounter: Secondary | ICD-10-CM | POA: Diagnosis present

## 2021-11-28 DIAGNOSIS — S065XAA Traumatic subdural hemorrhage with loss of consciousness status unknown, initial encounter: Secondary | ICD-10-CM | POA: Diagnosis present

## 2021-11-28 DIAGNOSIS — E872 Acidosis, unspecified: Secondary | ICD-10-CM | POA: Diagnosis present

## 2021-11-28 DIAGNOSIS — G9340 Encephalopathy, unspecified: Secondary | ICD-10-CM | POA: Diagnosis present

## 2021-11-28 DIAGNOSIS — D518 Other vitamin B12 deficiency anemias: Secondary | ICD-10-CM | POA: Diagnosis not present

## 2021-11-28 DIAGNOSIS — I5043 Acute on chronic combined systolic (congestive) and diastolic (congestive) heart failure: Secondary | ICD-10-CM

## 2021-11-28 DIAGNOSIS — W109XXA Fall (on) (from) unspecified stairs and steps, initial encounter: Secondary | ICD-10-CM | POA: Diagnosis present

## 2021-11-28 DIAGNOSIS — J188 Other pneumonia, unspecified organism: Secondary | ICD-10-CM | POA: Diagnosis present

## 2021-11-28 DIAGNOSIS — Z7984 Long term (current) use of oral hypoglycemic drugs: Secondary | ICD-10-CM

## 2021-11-28 DIAGNOSIS — I5032 Chronic diastolic (congestive) heart failure: Secondary | ICD-10-CM

## 2021-11-28 DIAGNOSIS — K5909 Other constipation: Secondary | ICD-10-CM | POA: Diagnosis present

## 2021-11-28 DIAGNOSIS — M1611 Unilateral primary osteoarthritis, right hip: Secondary | ICD-10-CM | POA: Diagnosis present

## 2021-11-28 DIAGNOSIS — Y92009 Unspecified place in unspecified non-institutional (private) residence as the place of occurrence of the external cause: Secondary | ICD-10-CM | POA: Diagnosis not present

## 2021-11-28 DIAGNOSIS — S32010A Wedge compression fracture of first lumbar vertebra, initial encounter for closed fracture: Secondary | ICD-10-CM

## 2021-11-28 DIAGNOSIS — Z9861 Coronary angioplasty status: Secondary | ICD-10-CM | POA: Diagnosis not present

## 2021-11-28 DIAGNOSIS — M48061 Spinal stenosis, lumbar region without neurogenic claudication: Secondary | ICD-10-CM | POA: Diagnosis present

## 2021-11-28 DIAGNOSIS — I739 Peripheral vascular disease, unspecified: Secondary | ICD-10-CM | POA: Diagnosis present

## 2021-11-28 DIAGNOSIS — I998 Other disorder of circulatory system: Secondary | ICD-10-CM | POA: Diagnosis present

## 2021-11-28 DIAGNOSIS — S32000A Wedge compression fracture of unspecified lumbar vertebra, initial encounter for closed fracture: Secondary | ICD-10-CM

## 2021-11-28 DIAGNOSIS — Z87891 Personal history of nicotine dependence: Secondary | ICD-10-CM

## 2021-11-28 DIAGNOSIS — Z955 Presence of coronary angioplasty implant and graft: Secondary | ICD-10-CM

## 2021-11-28 HISTORY — DX: Type 2 diabetes mellitus with other circulatory complications: E11.59

## 2021-11-28 HISTORY — DX: Local infection of the skin and subcutaneous tissue, unspecified: L08.9

## 2021-11-28 HISTORY — DX: Encephalopathy, unspecified: G93.40

## 2021-11-28 HISTORY — DX: Chronic kidney disease, stage 3a: N18.31

## 2021-11-28 HISTORY — DX: Polyneuropathy, unspecified: G62.9

## 2021-11-28 HISTORY — DX: Peripheral vascular disease, unspecified: I73.9

## 2021-11-28 HISTORY — DX: Anemia in other chronic diseases classified elsewhere: D63.8

## 2021-11-28 HISTORY — DX: Acidosis, unspecified: E87.20

## 2021-11-28 HISTORY — DX: Traumatic subdural hemorrhage with loss of consciousness status unknown, initial encounter: S06.5XAA

## 2021-11-28 HISTORY — DX: Pneumonia, unspecified organism: J18.9

## 2021-11-28 HISTORY — DX: Type 2 diabetes mellitus with other skin complications: E11.628

## 2021-11-28 HISTORY — DX: Personal history of nicotine dependence: Z87.891

## 2021-11-28 LAB — URINALYSIS, ROUTINE W REFLEX MICROSCOPIC
Bilirubin Urine: NEGATIVE
Glucose, UA: >=500 mg/dL — AB
Ketones, ur: 20 mg/dL — AB
Leukocytes,Ua: NEGATIVE
Nitrite: NEGATIVE
Protein, ur: 100 mg/dL — AB
Specific Gravity, Urine: 1.022 (ref 1.005–1.030)
pH: 5 (ref 5.0–8.0)

## 2021-11-28 LAB — PROTIME-INR
INR: 1.2 (ref 0.8–1.2)
Prothrombin Time: 15 seconds (ref 11.4–15.2)

## 2021-11-28 LAB — COMPREHENSIVE METABOLIC PANEL
ALT: 23 U/L (ref 0–44)
AST: 26 U/L (ref 15–41)
Albumin: 3.3 g/dL — ABNORMAL LOW (ref 3.5–5.0)
Alkaline Phosphatase: 248 U/L — ABNORMAL HIGH (ref 38–126)
Anion gap: 8 (ref 5–15)
BUN: 22 mg/dL (ref 8–23)
CO2: 22 mmol/L (ref 22–32)
Calcium: 8.2 mg/dL — ABNORMAL LOW (ref 8.9–10.3)
Chloride: 108 mmol/L (ref 98–111)
Creatinine, Ser: 1.13 mg/dL (ref 0.61–1.24)
GFR, Estimated: >60 mL/min (ref >60)
Glucose, Bld: 223 mg/dL — ABNORMAL HIGH (ref 70–99)
Potassium: 4.3 mmol/L (ref 3.5–5.1)
Sodium: 138 mmol/L (ref 135–145)
Total Bilirubin: 1.1 mg/dL (ref 0.3–1.2)
Total Protein: 7 g/dL (ref 6.5–8.1)

## 2021-11-28 LAB — CBC WITH DIFFERENTIAL/PLATELET
Abs Immature Granulocytes: 0.09 10*3/uL — ABNORMAL HIGH (ref 0.00–0.07)
Basophils Absolute: 0 10*3/uL (ref 0.0–0.1)
Basophils Relative: 0 %
Eosinophils Absolute: 0.1 10*3/uL (ref 0.0–0.5)
Eosinophils Relative: 1 %
HCT: 36.2 % — ABNORMAL LOW (ref 39.0–52.0)
Hemoglobin: 11.4 g/dL — ABNORMAL LOW (ref 13.0–17.0)
Immature Granulocytes: 1 %
Lymphocytes Relative: 11 %
Lymphs Abs: 1.1 10*3/uL (ref 0.7–4.0)
MCH: 29.6 pg (ref 26.0–34.0)
MCHC: 31.5 g/dL (ref 30.0–36.0)
MCV: 94 fL (ref 80.0–100.0)
Monocytes Absolute: 0.7 10*3/uL (ref 0.1–1.0)
Monocytes Relative: 6 %
Neutro Abs: 8.7 10*3/uL — ABNORMAL HIGH (ref 1.7–7.7)
Neutrophils Relative %: 81 %
Platelets: 197 10*3/uL (ref 150–400)
RBC: 3.85 MIL/uL — ABNORMAL LOW (ref 4.22–5.81)
RDW: 13.4 % (ref 11.5–15.5)
WBC: 10.8 10*3/uL — ABNORMAL HIGH (ref 4.0–10.5)
nRBC: 0 % (ref 0.0–0.2)

## 2021-11-28 LAB — BLOOD GAS, VENOUS
Acid-base deficit: 3.9 mmol/L — ABNORMAL HIGH (ref 0.0–2.0)
Bicarbonate: 21.6 mmol/L (ref 20.0–28.0)
Drawn by: 1356
FIO2: 36 %
O2 Saturation: 52.2 %
Patient temperature: 36.5
pCO2, Ven: 39 mmHg — ABNORMAL LOW (ref 44–60)
pH, Ven: 7.35 (ref 7.25–7.43)
pO2, Ven: 33 mmHg (ref 32–45)

## 2021-11-28 LAB — RESP PANEL BY RT-PCR (FLU A&B, COVID) ARPGX2
Influenza A by PCR: NEGATIVE
Influenza B by PCR: NEGATIVE
SARS Coronavirus 2 by RT PCR: NEGATIVE

## 2021-11-28 LAB — CK: Total CK: 66 U/L (ref 49–397)

## 2021-11-28 LAB — APTT: aPTT: 30 seconds (ref 24–36)

## 2021-11-28 LAB — CBG MONITORING, ED
Glucose-Capillary: 181 mg/dL — ABNORMAL HIGH (ref 70–99)
Glucose-Capillary: 233 mg/dL — ABNORMAL HIGH (ref 70–99)

## 2021-11-28 LAB — LACTIC ACID, PLASMA
Lactic Acid, Venous: 2.4 mmol/L (ref 0.5–1.9)
Lactic Acid, Venous: 2.7 mmol/L (ref 0.5–1.9)

## 2021-11-28 LAB — MRSA NEXT GEN BY PCR, NASAL: MRSA by PCR Next Gen: NOT DETECTED

## 2021-11-28 LAB — GLUCOSE, CAPILLARY: Glucose-Capillary: 274 mg/dL — ABNORMAL HIGH (ref 70–99)

## 2021-11-28 LAB — PROCALCITONIN: Procalcitonin: <0.1 ng/mL

## 2021-11-28 LAB — BRAIN NATRIURETIC PEPTIDE: B Natriuretic Peptide: 609 pg/mL — ABNORMAL HIGH (ref 0.0–100.0)

## 2021-11-28 MED ORDER — CHLORHEXIDINE GLUCONATE CLOTH 2 % EX PADS
6.0000 | MEDICATED_PAD | Freq: Every day | CUTANEOUS | Status: DC
  Administered 2021-11-29 – 2021-12-03 (×5): 6 via TOPICAL

## 2021-11-28 MED ORDER — IPRATROPIUM-ALBUTEROL 0.5-2.5 (3) MG/3ML IN SOLN
3.0000 mL | Freq: Once | RESPIRATORY_TRACT | Status: DC
  Filled 2021-11-28: qty 3

## 2021-11-28 MED ORDER — LEVALBUTEROL HCL 1.25 MG/0.5ML IN NEBU
INHALATION_SOLUTION | RESPIRATORY_TRACT | Status: AC
  Filled 2021-11-28: qty 0.5

## 2021-11-28 MED ORDER — SODIUM CHLORIDE 0.9 % IV SOLN
2.0000 g | Freq: Two times a day (BID) | INTRAVENOUS | Status: DC
  Administered 2021-11-29 – 2021-12-01 (×5): 2 g via INTRAVENOUS
  Filled 2021-11-28 (×5): qty 12.5

## 2021-11-28 MED ORDER — OXYCODONE HCL 5 MG PO TABS
5.0000 mg | ORAL_TABLET | Freq: Four times a day (QID) | ORAL | Status: DC | PRN
  Administered 2021-11-29 – 2021-12-04 (×7): 5 mg via ORAL
  Filled 2021-11-28 (×7): qty 1

## 2021-11-28 MED ORDER — INSULIN ASPART 100 UNIT/ML IJ SOLN
0.0000 [IU] | Freq: Three times a day (TID) | INTRAMUSCULAR | Status: DC
  Administered 2021-11-28: 5 [IU] via SUBCUTANEOUS
  Administered 2021-11-29: 2 [IU] via SUBCUTANEOUS
  Administered 2021-11-29 – 2021-11-30 (×2): 5 [IU] via SUBCUTANEOUS

## 2021-11-28 MED ORDER — ASPIRIN 81 MG PO TBEC
81.0000 mg | DELAYED_RELEASE_TABLET | Freq: Every day | ORAL | Status: DC
  Administered 2021-11-28: 81 mg via ORAL
  Filled 2021-11-28: qty 1

## 2021-11-28 MED ORDER — ACETAMINOPHEN 325 MG PO TABS
650.0000 mg | ORAL_TABLET | Freq: Four times a day (QID) | ORAL | Status: DC | PRN
  Administered 2021-11-29 (×2): 650 mg via ORAL
  Filled 2021-11-28 (×2): qty 2

## 2021-11-28 MED ORDER — IPRATROPIUM-ALBUTEROL 0.5-2.5 (3) MG/3ML IN SOLN
3.0000 mL | Freq: Once | RESPIRATORY_TRACT | Status: AC
  Administered 2021-11-28: 3 mL via RESPIRATORY_TRACT
  Filled 2021-11-28: qty 3

## 2021-11-28 MED ORDER — ATORVASTATIN CALCIUM 40 MG PO TABS
40.0000 mg | ORAL_TABLET | Freq: Every evening | ORAL | Status: DC
  Filled 2021-11-28 (×2): qty 1

## 2021-11-28 MED ORDER — SODIUM CHLORIDE 0.9 % IV BOLUS
1000.0000 mL | Freq: Once | INTRAVENOUS | Status: AC
  Administered 2021-11-28: 1000 mL via INTRAVENOUS

## 2021-11-28 MED ORDER — ACETAMINOPHEN 650 MG RE SUPP: 650.0000 mg | Freq: Four times a day (QID) | RECTAL | Status: DC | PRN

## 2021-11-28 MED ORDER — IPRATROPIUM-ALBUTEROL 0.5-2.5 (3) MG/3ML IN SOLN
3.0000 mL | Freq: Four times a day (QID) | RESPIRATORY_TRACT | Status: DC
  Administered 2021-11-28 – 2021-11-29 (×6): 3 mL via RESPIRATORY_TRACT
  Filled 2021-11-28 (×6): qty 3

## 2021-11-28 MED ORDER — ACETAMINOPHEN 325 MG PO TABS
650.0000 mg | ORAL_TABLET | Freq: Once | ORAL | Status: AC
  Administered 2021-11-28: 650 mg via ORAL
  Filled 2021-11-28: qty 2

## 2021-11-28 MED ORDER — GUAIFENESIN ER 600 MG PO TB12
600.0000 mg | ORAL_TABLET | Freq: Two times a day (BID) | ORAL | Status: DC
  Administered 2021-11-28 – 2021-12-04 (×12): 600 mg via ORAL
  Filled 2021-11-28 (×13): qty 1

## 2021-11-28 MED ORDER — METHYLPREDNISOLONE SODIUM SUCC 40 MG IJ SOLR
INTRAMUSCULAR | Status: AC
  Filled 2021-11-28: qty 1

## 2021-11-28 MED ORDER — VANCOMYCIN HCL 1500 MG/300ML IV SOLN
1500.0000 mg | INTRAVENOUS | Status: DC
Start: 2021-11-29 — End: 2021-11-29

## 2021-11-28 MED ORDER — NITROGLYCERIN 0.2 MG/HR TD PT24
0.2000 mg | MEDICATED_PATCH | Freq: Every day | TRANSDERMAL | Status: DC
Start: 2021-11-28 — End: 2021-12-04
  Administered 2021-11-28 – 2021-12-04 (×7): 0.2 mg via TRANSDERMAL
  Filled 2021-11-28 (×10): qty 1

## 2021-11-28 MED ORDER — INSULIN ASPART 100 UNIT/ML IJ SOLN: 3.0000 [IU] | Freq: Three times a day (TID) | INTRAMUSCULAR | Status: DC

## 2021-11-28 MED ORDER — SODIUM CHLORIDE 0.9 % IV SOLN
2.0000 g | Freq: Once | INTRAVENOUS | Status: AC
  Administered 2021-11-28: 2 g via INTRAVENOUS
  Filled 2021-11-28: qty 12.5

## 2021-11-28 MED ORDER — ONDANSETRON HCL 4 MG/2ML IJ SOLN: 4.0000 mg | Freq: Four times a day (QID) | INTRAMUSCULAR | Status: DC | PRN

## 2021-11-28 MED ORDER — DEXTROMETHORPHAN POLISTIREX ER 30 MG/5ML PO SUER
30.0000 mg | Freq: Two times a day (BID) | ORAL | Status: DC | PRN
Start: 2021-11-28 — End: 2021-12-04

## 2021-11-28 MED ORDER — VANCOMYCIN HCL 2000 MG/400ML IV SOLN
2000.0000 mg | Freq: Once | INTRAVENOUS | Status: AC
  Administered 2021-11-28: 2000 mg via INTRAVENOUS
  Filled 2021-11-28: qty 400

## 2021-11-28 MED ORDER — TRAZODONE HCL 50 MG PO TABS
25.0000 mg | ORAL_TABLET | Freq: Every evening | ORAL | Status: DC | PRN
  Administered 2021-12-01 (×2): 25 mg via ORAL
  Filled 2021-11-28 (×2): qty 1

## 2021-11-28 MED ORDER — POLYETHYLENE GLYCOL 3350 17 G PO PACK
17.0000 g | PACK | Freq: Every day | ORAL | Status: DC | PRN
Start: 2021-11-28 — End: 2021-12-04
  Administered 2021-11-29 – 2021-12-01 (×2): 17 g via ORAL
  Filled 2021-11-28 (×3): qty 1

## 2021-11-28 MED ORDER — LINAGLIPTIN 5 MG PO TABS
5.0000 mg | ORAL_TABLET | Freq: Every day | ORAL | Status: DC
  Administered 2021-11-28 – 2021-12-04 (×7): 5 mg via ORAL
  Filled 2021-11-28 (×7): qty 1

## 2021-11-28 MED ORDER — LEVALBUTEROL HCL 1.25 MG/0.5ML IN NEBU
1.2500 mg | INHALATION_SOLUTION | Freq: Once | RESPIRATORY_TRACT | Status: AC
  Administered 2021-11-28: 1.25 mg via RESPIRATORY_TRACT
  Filled 2021-11-28: qty 0.5

## 2021-11-28 MED ORDER — METHYLPREDNISOLONE SODIUM SUCC 40 MG IJ SOLR
80.0000 mg | Freq: Once | INTRAMUSCULAR | Status: AC
  Administered 2021-11-28: 80 mg via INTRAVENOUS
  Filled 2021-11-28: qty 2

## 2021-11-28 MED ORDER — ONDANSETRON HCL 4 MG PO TABS: 4.0000 mg | ORAL_TABLET | Freq: Four times a day (QID) | ORAL | Status: DC | PRN

## 2021-11-28 MED ORDER — LISINOPRIL 10 MG PO TABS
10.0000 mg | ORAL_TABLET | Freq: Every day | ORAL | Status: DC
  Administered 2021-11-28 – 2021-12-01 (×4): 10 mg via ORAL
  Filled 2021-11-28 (×4): qty 1

## 2021-11-28 NOTE — Assessment & Plan Note (Signed)
-   Follow-up B12 level: 515 -Pt received supplementation during the recent hospitalization at [facility]

## 2021-11-28 NOTE — ED Triage Notes (Signed)
Patient BIB by [facility] after girlfriend to home this am to find patient on floor. Patient reports that he fell walking up steps around 0730 however, EMS believes that patient may have fallen overnight. Patient is confused on arrival. Found on second story of home after he crawled to couch. Complaining of right hip/lower back pain from fall. CBG 246 per EMS. Sats on RA are 85%. Patient denies feeling SHOB. Recently discharged from [facility] with dx of infection of left leg/foot. Denies hitting head. Noted with abrasion to right elbow.

## 2021-11-28 NOTE — Assessment & Plan Note (Signed)
Hemoglobin stable from recent testing ?

## 2021-11-28 NOTE — Assessment & Plan Note (Addendum)
-   Remains high fall risk, PT evaluation requested, fall precautions ordered -PT recommends HHPT -CT Lumbar spine--L1 compression fracture with 25% height loss - no red flags on examination

## 2021-11-28 NOTE — ED Notes (Signed)
NRB attempted, pt unable to tolerate. [place] o2 increased to 6lpm.

## 2021-11-28 NOTE — ED Notes (Signed)
Duoneb administered via T-neb. Patient's oxygen saturation improved during the treatment.

## 2021-11-28 NOTE — Assessment & Plan Note (Signed)
-   Resumed home cardiac medications

## 2021-11-28 NOTE — Progress Notes (Addendum)
Pharmacy Antibiotic Note  Wayne [name] is a 81 y.o. male admitted on 11/28/2021 with pneumonia.  Pharmacy has been consulted for vancomycin dosing.  Plan: Vancomycin 2000 mg IV x 1 dose Vancomycin 1500 mg IV every 24 hours Cefepime 2000 mg IV every 12 hours. Monitor labs, c/s, and vanco level as indicated.  Height: 6\' 1"  (185.4 cm) Weight: 83.9 kg (185 lb) IBW/kg (Calculated) : 79.9  Temp (24hrs), Avg:97.7 F (36.5 C), Min:97.7 F (36.5 C), Max:97.7 F (36.5 C)  Recent Labs  Lab 11/28/21 0925 11/28/21 1208  WBC 10.8*  --   CREATININE 1.13  --   LATICACIDVEN 2.4* 2.7*    Estimated Creatinine Clearance: 57 mL/min (by C-G formula based on SCr of 1.13 mg/dL).    No Known Allergies  Antimicrobials this admission: Vanco 9/2 >> Cefepime 9/2 >>  Microbiology results: 9/2 BCx: pending 9/2 UCx: pending   9/2 MRSA PCR: pending  Thank you for allowing pharmacy to be a part of this patient's care.  11/2, PharmD Clinical Pharmacist 11/28/2021 1:04 PM

## 2021-11-28 NOTE — Assessment & Plan Note (Signed)
-   Resume home gabapentin

## 2021-11-28 NOTE — Assessment & Plan Note (Addendum)
-   Continue SSI coverage and frequent CBG monitoring, hold home glipizide -11/12/21 A1C-9.4 -continue ISS -plan to d/c with glipizide and linagliptin -outpatient follow up with PCP

## 2021-11-28 NOTE — ED Notes (Signed)
Patient transported to CT 

## 2021-11-28 NOTE — Assessment & Plan Note (Addendum)
-   He was evaluated by vascular surgery at [facility] during recent hospitalization and they did everything they could to optimize vascular flow to his right foot.   - Dr. [name] recommended aspirin 81 mg daily and high intensity statin treatment.  He did not recommend amputation of toe but local care and close monitoring.  -ASA 81 mg on hold until cleared by neurosurgery

## 2021-11-28 NOTE — H&P (Signed)
History and Physical  [facility]  Wayne [name] ZJQ:[id number] DOB: 04-08-1940 DOA: 11/28/2021  PCP: Patient, No Pcp Per  Patient coming from: Home by [facility] Level of care: Stepdown  I have personally briefly reviewed patient's old medical records in [place]  Chief Complaint: Fall at home   HPI: Wayne [name] is a 81 year old gentleman with type 2 diabetes mellitus, coronary artery disease status post stenting, hypertension, peripheral vascular disease, peripheral neuropathy, recent right second toe phalanx fracture, cellulitis right foot and leg, former smoker recently discharged from [facility] after being hospitalized for right foot cellulitis, metabolic encephalopathy.  He reports that he had been doing fairly well at home.  He lives alone but has hired help and close family members.  He reports that he was trying to walk up the stairs he believes yesterday when he apparently tripped and fell.  He was down on the floor for an unknown period of time and was not able to get up.  Apparently his girlfriend found him on the floor and called EMS.  Patient was noted to be confused and complained of shortness of breath.  He was hypoxic and his blood sugar was elevated.  His pulse ox was 85% on room air.  He had a small abrasion to the right elbow.  Patient was unsure if he hit his head however he was noted to have a bruise on the scalp.  His CT scan revealed minimal left parafalcine subdural hemorrhage.  Neurosurgery was consulted and they recommended repeat CT in 6 hours but patient can remain at [facility].  He also had a CT of the right hip that did not show acute fracture.  He was noted to have a multifocal pneumonia on chest x-ray.  He has a new oxygen requirement and he was started on steroids and antibiotics.  Admission was requested for further management.  Review of Systems: Review of Systems  Constitutional: Negative.   HENT: Negative.    Eyes: Negative.    Respiratory:  Positive for cough, shortness of breath and wheezing.   Cardiovascular: Negative.   Gastrointestinal: Negative.   Genitourinary: Negative.   Musculoskeletal:  Positive for falls, joint pain and myalgias.  Skin: Negative.   Neurological: Negative.   Endo/Heme/Allergies:  Bruises/bleeds easily.  Psychiatric/Behavioral:  Positive for memory loss. Negative for depression, substance abuse and suicidal ideas.   All other systems reviewed and are negative.    Past Medical History:  Diagnosis Date   Coronary artery disease    Diabetes mellitus without complication [facility])     Past Surgical History:  Procedure Laterality Date   ABDOMINAL AORTOGRAM W/LOWER EXTREMITY Right 11/13/2021   Procedure: ABDOMINAL AORTOGRAM W/LOWER EXTREMITY;  Surgeon: Cherre Robins, MD;  Location: [place] [facility];  Service: Cardiovascular;  Laterality: Right;   CORONARY ANGIOPLASTY WITH STENT PLACEMENT       reports that he has quit smoking. His smoking use included cigarettes. He does not have any smokeless tobacco history on file. He reports that he does not drink alcohol and does not use drugs.  No Known Allergies  Family History  Problem Relation Age of Onset   Diabetes type II Neg Hx     Prior to Admission medications   Medication Sig Start Date End Date Taking? Authorizing Provider  aspirin EC 81 MG tablet Take 1 tablet (81 mg total) by mouth daily. Swallow whole. 11/16/21  Yes Patrecia Pour, MD  atorvastatin (LIPITOR) 40 MG tablet Take 1  tablet (40 mg total) by mouth daily. 11/16/21  Yes Patrecia Pour, MD  cephALEXin (KEFLEX) 500 MG capsule Take 1 capsule (500 mg total) by mouth 2 (two) times daily. 11/20/21  Yes Bronson Ing, DPM  glipiZIDE (GLUCOTROL) 5 MG tablet Take 0.5 tablets (2.5 mg total) by mouth daily before breakfast. 11/16/21  Yes Patrecia Pour, MD  ibuprofen (ADVIL) 200 MG tablet Take 200 mg by mouth every 6 (six) hours as needed for mild pain.   Yes [provider]  linagliptin (TRADJENTA) 5 MG TABS tablet Take 1 tablet (5 mg total) by mouth daily. 11/16/21  Yes Patrecia Pour, MD  lisinopril (ZESTRIL) 10 MG tablet Take 1 tablet (10 mg total) by mouth daily. 11/16/21  Yes Patrecia Pour, MD  nitroGLYCERIN (NITRO-DUR) 0.2 mg/hr patch Place 1 patch (0.2 mg total) onto the skin daily. 11/17/21  Yes Trula Slade, DPM  Accu-Chek Softclix Lancets lancets Use 4 times daily as directed to check blood sugars. 11/16/21   Patrecia Pour, MD  blood glucose meter kit and supplies KIT Dispense based on patient and insurance preference. Use up to four times daily as directed. 11/15/21   Patrecia Pour, MD  blood glucose meter kit and supplies KIT Use up to four times daily as directed. 11/16/21   Patrecia Pour, MD  glucose blood test strip Use as instructed 06/13/12   Noemi Chapel, MD  glucose blood test strip Use 4 times daily as directed to check bloos sugars. 11/16/21   Patrecia Pour, MD    Physical Exam: Vitals:   11/28/21 1407 11/28/21 1450 11/28/21 1452 11/28/21 1630  BP:    (!) 150/84  Pulse:  (!) 101  95  Resp:    (!) 24  Temp:   97.6 F (36.4 C)   TempSrc:   Oral   SpO2: 93% 93%  96%  Weight:      Height:        Constitutional: frail, elderly male, NAD, calm, comfortable Eyes: PERRL, lids and conjunctivae normal ENMT: Mucous membranes are moist. Posterior pharynx clear of any exudate or lesions.Normal dentition.  Neck: normal, supple, no masses, no thyromegaly Respiratory: mild tachypnea, wheezing heard bilateral, rales LLL, No accessory muscle use.  Cardiovascular: normal s1, s2 sounds, no murmurs / rubs / gallops. No extremity edema. 2+ pedal pulses. No carotid bruits.  Abdomen: no tenderness, no masses palpated. No hepatosplenomegaly. Bowel sounds positive.  Musculoskeletal: right foot 2nd toe edematous, red, no clubbing / cyanosis. No joint deformity upper and lower extremities. Good ROM, no contractures. Normal muscle tone.  Skin: no  rashes, lesions, ulcers. No induration Neurologic: CN 2-12 grossly intact. Sensation intact, DTR normal. Strength 5/5 in all 4.  Psychiatric: Normal judgment and insight. Alert and oriented x 3. Normal mood.   Labs on Admission: I have personally reviewed following labs and imaging studies  CBC: Recent Labs  Lab 11/28/21 0925  WBC 10.8*  NEUTROABS 8.7*  HGB 11.4*  HCT 36.2*  MCV 94.0  PLT 824   Basic Metabolic Panel: Recent Labs  Lab 11/28/21 0925  NA 138  K 4.3  CL 108  CO2 22  GLUCOSE 223*  BUN 22  CREATININE 1.13  CALCIUM 8.2*   GFR: Estimated Creatinine Clearance: 57 mL/min (by C-G formula based on SCr of 1.13 mg/dL). Liver Function Tests: Recent Labs  Lab 11/28/21 0925  AST 26  ALT 23  ALKPHOS 248*  BILITOT 1.1  PROT 7.0  ALBUMIN 3.3*   No results for input(s): "LIPASE", "AMYLASE" in the last 168 hours. No results for input(s): "AMMONIA" in the last 168 hours. Coagulation Profile: Recent Labs  Lab 11/28/21 0925  INR 1.2   Cardiac Enzymes: Recent Labs  Lab 11/28/21 0925  CKTOTAL 66   BNP (last 3 results) No results for input(s): "PROBNP" in the last 8760 hours. HbA1C: No results for input(s): "HGBA1C" in the last 72 hours. CBG: Recent Labs  Lab 11/28/21 0857  GLUCAP 181*   Lipid Profile: No results for input(s): "CHOL", "HDL", "LDLCALC", "TRIG", "CHOLHDL", "LDLDIRECT" in the last 72 hours. Thyroid Function Tests: No results for input(s): "TSH", "T4TOTAL", "FREET4", "T3FREE", "THYROIDAB" in the last 72 hours. Anemia Panel: No results for input(s): "VITAMINB12", "FOLATE", "FERRITIN", "TIBC", "IRON", "RETICCTPCT" in the last 72 hours. Urine analysis:    Component Value Date/Time   COLORURINE YELLOW 11/11/2021 [place] 11/11/2021 1239   LABSPEC 1.018 11/11/2021 1239   PHURINE 6.0 11/11/2021 1239   GLUCOSEU >1,000 (A) 11/11/2021 1239   HGBUR NEGATIVE 11/11/2021 1239   BILIRUBINUR NEGATIVE 11/11/2021 1239   KETONESUR  NEGATIVE 11/11/2021 1239   PROTEINUR TRACE (A) 11/11/2021 1239   UROBILINOGEN 0.2 06/13/2012 0325   NITRITE NEGATIVE 11/11/2021 1239   LEUKOCYTESUR NEGATIVE 11/11/2021 1239    Radiological Exams on Admission: CT HEAD WO CONTRAST  Result Date: 11/28/2021 CLINICAL DATA:  Follow-up subdural hematoma EXAM: CT HEAD WITHOUT CONTRAST TECHNIQUE: Contiguous axial images were obtained from the base of the skull through the vertex without intravenous contrast. RADIATION DOSE REDUCTION: This exam was performed according to the departmental dose-optimization program which includes automated exposure control, adjustment of the mA and/or kV according to patient size and/or use of iterative reconstruction technique. COMPARISON:  11/28/2021, 10:17 a.m. FINDINGS: Brain: Unchanged, minimal left parafalcine subdural hemorrhage (series 4, image 22). No evidence of acute infarction, new hemorrhage, hydrocephalus, new extra-axial collection or mass lesion/mass effect. Vascular: No hyperdense vessel or unexpected calcification. Skull: Normal. Negative for fracture or focal lesion. Sinuses/Orbits: No acute finding. Other: Large soft tissue contusion of the left scalp vertex (series 4, image 15). IMPRESSION: 1. Unchanged, minimal left parafalcine subdural hemorrhage. No new or enlarging hemorrhage. 2. Large soft tissue contusion of the left scalp vertex. Electronically Signed   By: Delanna Ahmadi M.D.   On: 11/28/2021 16:15   CT Head Wo Contrast  Addendum Date: 11/28/2021   ADDENDUM REPORT: 11/28/2021 10:42 ADDENDUM: Study discussed by telephone with PA TAMMY TRIPLETT on 11/28/2021 at 1037 hours. Electronically Signed   By: Genevie Ann M.D.   On: 11/28/2021 10:42   Result Date: 11/28/2021 CLINICAL DATA:  81 year old male found down. EXAM: CT HEAD WITHOUT CONTRAST TECHNIQUE: Contiguous axial images were obtained from the base of the skull through the vertex without intravenous contrast. RADIATION DOSE REDUCTION: This exam was performed  according to the departmental dose-optimization program which includes automated exposure control, adjustment of the mA and/or kV according to patient size and/or use of iterative reconstruction technique. COMPARISON:  Head CT 11/12/2021. FINDINGS: Brain: Trace anterior para falcine subdural blood suspected on series 3, image 18. No mass effect. No other acute intracranial hemorrhage identified. No midline shift, ventriculomegaly, mass effect, evidence of mass lesion, or evidence of cortically based acute infarction. No IVH. Stable gray-white matter differentiation throughout the brain. Vascular: Calcified atherosclerosis at the skull base. No suspicious intracranial vascular hyperdensity. Skull: Stable.  No acute osseous abnormality identified. Sinuses/Orbits: Visualized paranasal sinuses and mastoids are stable and well  aerated. Other: Broad-based new left posterior convexity scalp hematoma. Underlying calvarium appears stable and intact. Stable orbits soft tissues. Calcified scalp vessel atherosclerosis. IMPRESSION: 1. Trace anterior para-falcine subdural blood no mass effect. No other acute intracranial abnormality. 2. Acute left posterior convexity scalp hematoma without underlying skull fracture. Electronically Signed: By: Genevie Ann M.D. On: 11/28/2021 10:34   CT Hip Right Wo Contrast  Result Date: 11/28/2021 CLINICAL DATA:  [facility] BIB by [facility] after girlfriend to home this am to find patient on floor. Patient reports that he fell walking up steps around 0730 however, EMS believes that patient may have fallen overnight. Patient is confused on arrival. Found on second story of home after he crawled to couch. Complaining of right hip/lower back pain from fall. EXAM: CT OF THE RIGHT HIP WITHOUT CONTRAST TECHNIQUE: Multidetector CT imaging of the right hip was performed according to the standard protocol. Multiplanar CT image reconstructions were also generated. RADIATION DOSE REDUCTION: This exam was performed  according to the departmental dose-optimization program which includes automated exposure control, adjustment of the mA and/or kV according to patient size and/or use of iterative reconstruction technique. COMPARISON:  None Available. FINDINGS: Bones/Joint/Cartilage No fracture.  No bone lesion. Superolateral right hip joint space narrowing. Small marginal spurs from the base of the right femoral head and bony prominence from the superior right acetabulum, findings consistent with osteoarthritis. No convincing joint effusion. Ligaments Suboptimally assessed by CT. Muscles and Tendons No evidence of a muscle hematoma/injury. Tendons are grossly intact. Soft tissues No acute findings. IMPRESSION: 1. No fracture or acute finding. 2. Mild right hip joint osteoarthritis. Electronically Signed   By: Lajean Manes M.D.   On: 11/28/2021 10:39   DG Chest Port 1 View  Result Date: 11/28/2021 CLINICAL DATA:  Patient found on the floor this morning. Patient reports that he fell walking up steps this morning. EXAM: PORTABLE CHEST 1 VIEW COMPARISON:  11/11/2021. FINDINGS: Cardiac silhouette is normal in size. No mediastinal or hilar masses. Thickened bilateral interstitial markings. Mild hazy airspace opacity projects in the mid to lower lungs. Small pleural effusions suspected. No pneumothorax. Skeletal structures are grossly intact. IMPRESSION: 1. Interstitial thickening with hazy lung opacities and probable small effusions. Suspect pulmonary edema, with multifocal pneumonia in the differential diagnosis. Electronically Signed   By: Lajean Manes M.D.   On: 11/28/2021 10:35    EKG: Independently reviewed.  Sinus tachycardia  Assessment/Plan Principal Problem:   Acute respiratory failure with hypoxia (HCC) Active Problems:   Subdural hematoma (HCC)   Cellulitis of right foot   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)   CAD S/P percutaneous coronary angioplasty   Multifocal pneumonia    Dyslipidemia   Fall at home   Failure to thrive in adult   Lactic acidosis   B12 deficiency   B12 deficiency anemia   PAD (peripheral artery disease) (HCC)   Diabetic neuropathy (HCC)   Assessment and Plan: * Acute respiratory failure with hypoxia ([place]) - Secondary to multifocal pneumonia -Status post IV Solu-Medrol x1 dose given in ED -Continue supplemental oxygen, wean as needed -Continue bronchodilators as ordered - Admitting to stepdown ICU for closer monitoring  Subdural hematoma ([place]) - Neurosurgery was consulted and recommended repeat CT scan in 6 hours which has been completed and hemorrhage remains minimal and stable with no enlargement  Diabetic neuropathy ([place]) - Resume home gabapentin  PAD (peripheral artery disease) ([place]) - He was evaluated by vascular surgery at [facility] during recent  hospitalization and not felt to be a candidate for invasive procedures and they had recommended that he take aspirin 81 mg daily which is temporarily on hold  B12 deficiency anemia - Hemoglobin stable from recent testing  B12 deficiency - Follow-up B12 level -She received supplementation during the recent hospitalization at [facility]  Lactic acidosis - Secondary to hypoxia, hydrated with IV fluid boluses in the ED  Failure to thrive in adult - Precipitous decline in recent weeks, requesting palliative medicine consultation for further goals of care discussions  Fall at home - Remains high fall risk, PT evaluation requested, fall precautions ordered  Dyslipidemia - Discussed with daughter she decided that she did not want her father taking a statin medication so she has not been giving him atorvastatin and requested that it not be given to him in the hospital  Multifocal pneumonia - Broad-spectrum antibiotic coverage ordered, follow-up blood cultures, follow-up procalcitonin testing  CAD S/P percutaneous coronary angioplasty - Resume home cardiac medications  Type 2  diabetes mellitus with peripheral vascular disease ([place]) - Continue SSI coverage and frequent CBG monitoring, hold home glipizide  Cellulitis of right foot - She has been on antibiotics for this and he is followed closely by his podiatrist for this.  We are temporarily holding his oral antibiotics while he is on IV antibiotics for pneumonia.  Continue local wound care.   DVT prophylaxis: SCDs  Code Status: full   Family Communication: daughter updated   Disposition Plan: TBD   Consults called: palliative care   Admission status: INP  Level of care: Stepdown Irwin Brakeman MD [facility] How to contact the [facility] Attending or Consulting provider [place] or covering provider during after hours [place], for this patient?  Check the care team in [facility] and look for a) attending/consulting [facility] provider listed and b) the [facility] team listed Log into [email or web address] and use [place]'s universal password to access. If you do not have the password, please contact the hospital operator. Locate the [facility] provider you are looking for under [facility] and page to a number that you can be directly reached. If you still have difficulty reaching the provider, please page the [facility] (Director on Call) for the Hospitalists listed on amion for assistance.   If 7PM-7AM, please contact night-coverage [email or web address] Password [facility]  11/28/2021, 4:45 PM

## 2021-11-28 NOTE — Assessment & Plan Note (Signed)
-   likely secondary to hypoxia, hydrated with IV fluid boluses in the ED

## 2021-11-28 NOTE — Assessment & Plan Note (Addendum)
-   Neurosurgery was consulted and recommended repeat CT scan in 6 hours which has been completed and hemorrhage remains minimal and stable with no enlargement -- we have stopped aspirin for now and not using any anticoagulants

## 2021-11-28 NOTE — Assessment & Plan Note (Signed)
-   Discussed with daughter she decided that she did not want her father taking a statin medication so she has not been giving him atorvastatin and requested that it not be given to him in the hospital

## 2021-11-28 NOTE — Assessment & Plan Note (Addendum)
-   Precipitous decline in recent weeks, requesting palliative medicine consultation for further goals of care discussions - plan to go to SNF with palliative services

## 2021-11-28 NOTE — Assessment & Plan Note (Signed)
-   She has been on antibiotics for this and he is followed closely by his podiatrist for this.  We are temporarily holding his oral antibiotics while he is on IV antibiotics for pneumonia.  Continue local wound care.

## 2021-11-28 NOTE — ED Notes (Signed)
ED Provider at bedside. RT at bedside 

## 2021-11-28 NOTE — Assessment & Plan Note (Addendum)
-   Broad-spectrum antibiotic coverage initially but as he has clinically improved we are de-escalating antibiotics --cefepime 9/2>>9/5 --doxy 9/5>>9/8 --cefinir 9/6>>9/8 --PCT 0.25>>0.19 --finished course of antibiotics during hospitalization

## 2021-11-28 NOTE — Assessment & Plan Note (Addendum)
-   Secondary to CHF>> multifocal pneumonia -- initially on IV abx>>po doxy -Status post IV Solu-Medrol x1 dose given in ED -initially on supplemental oxygen -Continue bronchodilators as ordered - now stable on RA

## 2021-11-28 NOTE — Hospital Course (Signed)
81 year old gentleman with type 2 diabetes mellitus, coronary artery disease status post stenting, hypertension, peripheral vascular disease, peripheral neuropathy, recent right second toe phalanx fracture, cellulitis right foot and leg, former smoker recently discharged from [facility] after being hospitalized for right foot cellulitis, metabolic encephalopathy.  He reports that he had been doing fairly well at home.  He lives alone but has hired help and close family members.  He reports that he was trying to walk up the stairs he believes yesterday when he apparently tripped and fell.  He was down on the floor for an unknown period of time and was not able to get up.  Apparently his girlfriend found him on the floor and called EMS.  Patient was noted to be confused and complained of shortness of breath.  He was hypoxic and his blood sugar was elevated.  His pulse ox was 85% on room air.  He had a small abrasion to the right elbow.  Patient was unsure if he hit his head however he was noted to have a bruise on the scalp.  His CT scan revealed minimal left parafalcine subdural hemorrhage.  Neurosurgery was consulted and they recommended repeat CT in 6 hours but patient can remain at [facility].  He also had a CT of the right hip that did not show acute fracture.  He was noted to have a multifocal pneumonia on chest x-ray.  He has a new oxygen requirement and he was started on steroids and antibiotics.  Admission was requested for further management.

## 2021-11-28 NOTE — ED Provider Notes (Signed)
[facility] EMERGENCY DEPARTMENT Provider Note   CSN: [id number] Arrival date & time: 11/28/21  0258     History  Chief Complaint  Patient presents with   Wayne [name] is a 81 y.o. male.   Fall Pertinent negatives include no chest pain, no abdominal pain, no headaches and no shortness of breath.       Wayne [name] is a 81 y.o. male with past medical history of type 2 diabetes and hypertension who presents to the Emergency Department via EMS from home for evaluation of fall.  Patient who lives at home by himself was found this morning by family member in the living room floor.  Patient states that he fell sometime this morning attempting to go up the stairs.  He cannot tell me exactly what time the fall occurred.  He is complaining of pain of his right hip.  Unsure if he struck his head during the fall.  Per EMS, patient's blood sugar was 246 and oxygen saturations were 85% on room air.  Patient does not have a supplemental oxygen requirement at baseline.  He denies any chest pain or shortness of breath.  He was discharged approximately 1 week ago from [facility] after being admitted for cellulitis of his right foot and swelling of his left leg.  He is currently taking antibiotic (keflex) he had  PT at home yesterday.  I have contacted patient's daughter Wayne [name], for additional history.  She states that he was found by her mother lying in the living room floor.  Fell approximately 3 to 5 feet down several steps.    Home Medications Prior to Admission medications   Medication Sig Start Date End Date Taking? Authorizing Provider  Accu-Chek Softclix Lancets lancets Use 4 times daily as directed to check blood sugars. 11/16/21   Patrecia Pour, MD  aspirin EC 81 MG tablet Take 1 tablet (81 mg total) by mouth daily. Swallow whole. 11/16/21   Patrecia Pour, MD  atorvastatin (LIPITOR) 40 MG tablet Take 1 tablet (40 mg total) by mouth daily. 11/16/21   Patrecia Pour, MD  blood glucose  meter kit and supplies KIT Dispense based on patient and insurance preference. Use up to four times daily as directed. 11/15/21   Patrecia Pour, MD  blood glucose meter kit and supplies KIT Use up to four times daily as directed. 11/16/21   Patrecia Pour, MD  cephALEXin (KEFLEX) 500 MG capsule Take 1 capsule (500 mg total) by mouth 2 (two) times daily. 11/20/21   Bronson Ing, DPM  glipiZIDE (GLUCOTROL) 5 MG tablet Take 0.5 tablets (2.5 mg total) by mouth daily before breakfast. 11/16/21   Patrecia Pour, MD  glucose blood test strip Use as instructed 06/13/12   Noemi Chapel, MD  glucose blood test strip Use 4 times daily as directed to check bloos sugars. 11/16/21   Patrecia Pour, MD  ibuprofen (ADVIL) 200 MG tablet Take 200 mg by mouth every 6 (six) hours as needed.    [provider]  linagliptin (TRADJENTA) 5 MG TABS tablet Take 1 tablet (5 mg total) by mouth daily. 11/16/21   Patrecia Pour, MD  lisinopril (ZESTRIL) 10 MG tablet Take 1 tablet (10 mg total) by mouth daily. 11/16/21   Patrecia Pour, MD  nitroGLYCERIN (NITRO-DUR) 0.2 mg/hr patch Place 1 patch (0.2 mg total) onto the skin daily. 11/17/21   Trula Slade, DPM      Allergies  Patient has no known allergies.    Review of Systems   Review of Systems  Constitutional:  Negative for chills and fever.  Eyes:  Negative for visual disturbance.  Respiratory:  Negative for shortness of breath.   Cardiovascular:  Negative for chest pain.  Gastrointestinal:  Negative for abdominal pain, nausea and vomiting.  Genitourinary:  Negative for difficulty urinating.  Musculoskeletal:  Positive for arthralgias (right hip pain). Negative for neck pain.  Skin:  Negative for wound.  Neurological:  Negative for dizziness, syncope, weakness, numbness and headaches.  Psychiatric/Behavioral:  Positive for confusion.     Physical Exam Updated Vital Signs BP (!) 150/85   Pulse (!) 104   Temp 97.7 F (36.5 C) (Oral)   Resp 18   Ht 6'  1" (1.854 m)   Wt 83.9 kg   SpO2 92%   BMI 24.41 kg/m  Physical Exam Vitals and nursing note reviewed.  Constitutional:      Appearance: Normal appearance.     Comments: Patient is frail appearing  HENT:     Head:     Comments: No obvious scalp hematomas    Mouth/Throat:     Mouth: Mucous membranes are dry.  Eyes:     Extraocular Movements: Extraocular movements intact.     Conjunctiva/sclera: Conjunctivae normal.     Pupils: Pupils are equal, round, and reactive to light.  Cardiovascular:     Rate and Rhythm: Regular rhythm. Tachycardia present.     Pulses: Normal pulses.  Pulmonary:     Breath sounds: Wheezing present.     Comments: Mild increased work of breathing on exam, diffuse expiratory wheezes noted throughout Abdominal:     Palpations: Abdomen is soft.     Tenderness: There is no abdominal tenderness.  Musculoskeletal:        General: Tenderness and signs of injury present.     Cervical back: Normal range of motion. No tenderness.     Comments: Right hip pain with range of motion of the right lower extremity.  Patient is lying on the stretcher with both legs flexed.  Skin:    General: Skin is warm.     Capillary Refill: Capillary refill takes less than 2 seconds.  Neurological:     Mental Status: He is alert and oriented to person, place, and time.     GCS: GCS eye subscore is 4. GCS verbal subscore is 5. GCS motor subscore is 6.     Sensory: Sensation is intact.     Motor: Motor function is intact.     Comments: CN II through XII intact.  Speech clear.  Patient mentating well     ED Results / Procedures / Treatments   Labs (all labs ordered are listed, but only abnormal results are displayed) Labs Reviewed  CBG MONITORING, ED - Abnormal; Notable for the following components:      Result Value   Glucose-Capillary 181 (*)    All other components within normal limits  CULTURE, BLOOD (ROUTINE X 2)  CULTURE, BLOOD (ROUTINE X 2)  URINE CULTURE  LACTIC ACID,  PLASMA  LACTIC ACID, PLASMA  COMPREHENSIVE METABOLIC PANEL  CBC WITH DIFFERENTIAL/PLATELET  PROTIME-INR  APTT  URINALYSIS, ROUTINE W REFLEX MICROSCOPIC  BLOOD GAS, VENOUS  CK    EKG EKG Interpretation  Date/Time:  Saturday November 28 2021 08:54:43 EDT Ventricular Rate:  108 PR Interval:  154 QRS Duration: 89 QT Interval:  333 QTC Calculation: 447 R Axis:   81 Text Interpretation:  Sinus tachycardia Probable anterior infarct, age indeterminate Confirmed by Fredia Sorrow [phone]) on 11/28/2021 9:08:39 AM  Radiology CT Head Wo Contrast  Addendum Date: 11/28/2021   ADDENDUM REPORT: 11/28/2021 10:42 ADDENDUM: Study discussed by telephone with PA Darril Patriarca on 11/28/2021 at 1037 hours. Electronically Signed   By: Genevie Ann M.D.   On: 11/28/2021 10:42   Result Date: 11/28/2021 CLINICAL DATA:  81 year old male found down. EXAM: CT HEAD WITHOUT CONTRAST TECHNIQUE: Contiguous axial images were obtained from the base of the skull through the vertex without intravenous contrast. RADIATION DOSE REDUCTION: This exam was performed according to the departmental dose-optimization program which includes automated exposure control, adjustment of the mA and/or kV according to patient size and/or use of iterative reconstruction technique. COMPARISON:  Head CT 11/12/2021. FINDINGS: Brain: Trace anterior para falcine subdural blood suspected on series 3, image 18. No mass effect. No other acute intracranial hemorrhage identified. No midline shift, ventriculomegaly, mass effect, evidence of mass lesion, or evidence of cortically based acute infarction. No IVH. Stable gray-white matter differentiation throughout the brain. Vascular: Calcified atherosclerosis at the skull base. No suspicious intracranial vascular hyperdensity. Skull: Stable.  No acute osseous abnormality identified. Sinuses/Orbits: Visualized paranasal sinuses and mastoids are stable and well aerated. Other: Broad-based new left posterior convexity  scalp hematoma. Underlying calvarium appears stable and intact. Stable orbits soft tissues. Calcified scalp vessel atherosclerosis. IMPRESSION: 1. Trace anterior para-falcine subdural blood no mass effect. No other acute intracranial abnormality. 2. Acute left posterior convexity scalp hematoma without underlying skull fracture. Electronically Signed: By: Genevie Ann M.D. On: 11/28/2021 10:34   CT Hip Right Wo Contrast  Result Date: 11/28/2021 CLINICAL DATA:  Patient BIB by [facility] after girlfriend to home this am to find patient on floor. Patient reports that he fell walking up steps around 0730 however, EMS believes that patient may have fallen overnight. Patient is confused on arrival. Found on second story of home after he crawled to couch. Complaining of right hip/lower back pain from fall. EXAM: CT OF THE RIGHT HIP WITHOUT CONTRAST TECHNIQUE: Multidetector CT imaging of the right hip was performed according to the standard protocol. Multiplanar CT image reconstructions were also generated. RADIATION DOSE REDUCTION: This exam was performed according to the departmental dose-optimization program which includes automated exposure control, adjustment of the mA and/or kV according to patient size and/or use of iterative reconstruction technique. COMPARISON:  None Available. FINDINGS: Bones/Joint/Cartilage No fracture.  No bone lesion. Superolateral right hip joint space narrowing. Small marginal spurs from the base of the right femoral head and bony prominence from the superior right acetabulum, findings consistent with osteoarthritis. No convincing joint effusion. Ligaments Suboptimally assessed by CT. Muscles and Tendons No evidence of a muscle hematoma/injury. Tendons are grossly intact. Soft tissues No acute findings. IMPRESSION: 1. No fracture or acute finding. 2. Mild right hip joint osteoarthritis. Electronically Signed   By: Lajean Manes M.D.   On: 11/28/2021 10:39   DG Chest Port 1 View  Result Date:  11/28/2021 CLINICAL DATA:  Patient found on the floor this morning. Patient reports that he fell walking up steps this morning. EXAM: PORTABLE CHEST 1 VIEW COMPARISON:  11/11/2021. FINDINGS: Cardiac silhouette is normal in size. No mediastinal or hilar masses. Thickened bilateral interstitial markings. Mild hazy airspace opacity projects in the mid to lower lungs. Small pleural effusions suspected. No pneumothorax. Skeletal structures are grossly intact. IMPRESSION: 1. Interstitial thickening with hazy lung opacities and probable small effusions. Suspect pulmonary edema, with multifocal pneumonia  in the differential diagnosis. Electronically Signed   By: Lajean Manes M.D.   On: 11/28/2021 10:35    Procedures Procedures    Medications Ordered in ED Medications - No data to display  ED Course/ Medical Decision Making/ A&P                           Medical Decision Making Patient comes from home for evaluation of injury sustained in a fall that occurred at an unknown time this morning.  Patient states that he fell attempting to go up stairs.  Unsure if he struck his head.  No reported LOC.  Patient does not take blood thinners.  On exam, patient is frail appearing.  Mucous membranes are dry patient is mildly hypoxic and tachycardic.  He has diffuse expiratory wheezes on my exam some increased work of breathing as well.  He has tenderness with range of motion of the right lower extremity.  Differential diagnosis would include but not limited to acute head injury, hip fracture, sepsis, musculoskeletal injuries  Have spoken with patient's daughter, Wayne [name], for further history.  She  that patient fell attempting to go downstairs.  Fell approximately 3 to 5 feet and was found lying in the living room floor unable to get up without assistance.  Patient was discharged from hospital 2 week ago.  He was admitted for cellulitis of the right foot and swelling of the left leg.    Amount and/or Complexity of Data  Reviewed Labs: ordered.    Details: Labs s without significant leukocytosis, INR 1.2, chemistries show blood sugar 223, kidney functions unremarkable.  Alk phos elevated at 248 remaining liver studies unremarkable, total CK unremarkable, blood and urine cultures pending Radiology: ordered.    Details: Chest x-ray shows interstitial thickening with likely multifocal pneumonia.  CT hip without evidence of acute fracture or dislocation.  CT head shows trace subdural hematoma anterior parafalcine out mass effect, no evidence of skull fracture. ECG/medicine tests: ordered.    Details: EKG shows sinus tachycardia Discussion of management or test interpretation with external provider(s): Discussed CT findings with neurosurgery, NP Bergman who recommends repeat head CT in 6 hours, patient felt appropriate to stay at [facility]  Patient with subdural hematoma and multifocal pneumonia, antibiotics initiated.  New oxygen requirement.  Will require hospital admission.  I spoke with patient's daughter and updated her regarding care plan  Discussed pt findings with [facility] hospitalist, Dr. Wynetta Emery who agrees to admit.      CRITICAL CARE Performed by: Avery Klingbeil Total critical care time: 40 minutes Critical care time was exclusive of separately billable procedures and treating other patients. Critical care was necessary to treat or prevent imminent or life-threatening deterioration. Critical care was time spent personally by me on the following activities: development of treatment plan with patient and/or surrogate as well as nursing, discussions with consultants, evaluation of patient's response to treatment, examination of patient, obtaining history from patient or surrogate, ordering and performing treatments and interventions, ordering and review of laboratory studies, ordering and review of radiographic studies, pulse oximetry and re-evaluation of patient's condition.          Final Clinical  Impression(s) / ED Diagnoses Final diagnoses:  Acute respiratory failure with hypoxia ([place])  Multifocal pneumonia  Subdural hematoma ([place])    Rx / DC Orders ED Discharge Orders     None         Kem Parkinson, PA-C 11/28/21 1552  Fredia Sorrow, MD 11/28/21 [phone]

## 2021-11-29 ENCOUNTER — Other Ambulatory Visit (HOSPITAL_COMMUNITY): Payer: Self-pay | Admitting: *Deleted

## 2021-11-29 ENCOUNTER — Inpatient Hospital Stay (HOSPITAL_COMMUNITY): Payer: Medicare Other

## 2021-11-29 DIAGNOSIS — I5021 Acute systolic (congestive) heart failure: Secondary | ICD-10-CM

## 2021-11-29 DIAGNOSIS — E538 Deficiency of other specified B group vitamins: Secondary | ICD-10-CM | POA: Diagnosis not present

## 2021-11-29 DIAGNOSIS — I2510 Atherosclerotic heart disease of native coronary artery without angina pectoris: Secondary | ICD-10-CM | POA: Diagnosis not present

## 2021-11-29 DIAGNOSIS — J9601 Acute respiratory failure with hypoxia: Secondary | ICD-10-CM | POA: Diagnosis not present

## 2021-11-29 DIAGNOSIS — E785 Hyperlipidemia, unspecified: Secondary | ICD-10-CM

## 2021-11-29 DIAGNOSIS — L03115 Cellulitis of right lower limb: Secondary | ICD-10-CM | POA: Diagnosis not present

## 2021-11-29 DIAGNOSIS — D519 Vitamin B12 deficiency anemia, unspecified: Secondary | ICD-10-CM | POA: Diagnosis not present

## 2021-11-29 LAB — GLUCOSE, CAPILLARY
Glucose-Capillary: 233 mg/dL — ABNORMAL HIGH (ref 70–99)
Glucose-Capillary: 211 mg/dL — ABNORMAL HIGH (ref 70–99)
Glucose-Capillary: 145 mg/dL — ABNORMAL HIGH (ref 70–99)
Glucose-Capillary: 76 mg/dL (ref 70–99)
Glucose-Capillary: 97 mg/dL (ref 70–99)

## 2021-11-29 LAB — CBC WITH DIFFERENTIAL/PLATELET
Abs Immature Granulocytes: 0.05 10*3/uL (ref 0.00–0.07)
Basophils Absolute: 0 10*3/uL (ref 0.0–0.1)
Basophils Relative: 0 %
Eosinophils Absolute: 0 10*3/uL (ref 0.0–0.5)
Eosinophils Relative: 0 %
HCT: 32.5 % — ABNORMAL LOW (ref 39.0–52.0)
Hemoglobin: 10.2 g/dL — ABNORMAL LOW (ref 13.0–17.0)
Immature Granulocytes: 1 %
Lymphocytes Relative: 8 %
Lymphs Abs: 0.8 10*3/uL (ref 0.7–4.0)
MCH: 29.1 pg (ref 26.0–34.0)
MCHC: 31.4 g/dL (ref 30.0–36.0)
MCV: 92.9 fL (ref 80.0–100.0)
Monocytes Absolute: 0.5 10*3/uL (ref 0.1–1.0)
Monocytes Relative: 5 %
Neutro Abs: 8.6 10*3/uL — ABNORMAL HIGH (ref 1.7–7.7)
Neutrophils Relative %: 86 %
Platelets: 169 10*3/uL (ref 150–400)
RBC: 3.5 MIL/uL — ABNORMAL LOW (ref 4.22–5.81)
RDW: 13.4 % (ref 11.5–15.5)
WBC: 9.9 10*3/uL (ref 4.0–10.5)
nRBC: 0 % (ref 0.0–0.2)

## 2021-11-29 LAB — MAGNESIUM: Magnesium: 2.1 mg/dL (ref 1.7–2.4)

## 2021-11-29 LAB — BASIC METABOLIC PANEL
Anion gap: 9 (ref 5–15)
BUN: 27 mg/dL — ABNORMAL HIGH (ref 8–23)
CO2: 18 mmol/L — ABNORMAL LOW (ref 22–32)
Calcium: 8.1 mg/dL — ABNORMAL LOW (ref 8.9–10.3)
Chloride: 111 mmol/L (ref 98–111)
Creatinine, Ser: 1.17 mg/dL (ref 0.61–1.24)
GFR, Estimated: >60 mL/min (ref >60)
Glucose, Bld: 263 mg/dL — ABNORMAL HIGH (ref 70–99)
Potassium: 4.1 mmol/L (ref 3.5–5.1)
Sodium: 138 mmol/L (ref 135–145)

## 2021-11-29 LAB — ECHOCARDIOGRAM COMPLETE
Area-P 1/2: 5.66 cm2
Height: 72 in
S' Lateral: 3.7 cm
Weight: 3167.57 oz

## 2021-11-29 LAB — PROCALCITONIN: Procalcitonin: 0.25 ng/mL

## 2021-11-29 LAB — BRAIN NATRIURETIC PEPTIDE: B Natriuretic Peptide: 2052 pg/mL — ABNORMAL HIGH (ref 0.0–100.0)

## 2021-11-29 LAB — VITAMIN B12: Vitamin B-12: 515 pg/mL (ref 180–914)

## 2021-11-29 MED ORDER — IPRATROPIUM-ALBUTEROL 0.5-2.5 (3) MG/3ML IN SOLN
3.0000 mL | Freq: Three times a day (TID) | RESPIRATORY_TRACT | Status: DC
  Administered 2021-11-30 – 2021-12-01 (×6): 3 mL via RESPIRATORY_TRACT
  Filled 2021-11-29 (×5): qty 3

## 2021-11-29 MED ORDER — INSULIN ASPART 100 UNIT/ML IJ SOLN
6.0000 [IU] | Freq: Three times a day (TID) | INTRAMUSCULAR | Status: DC
  Administered 2021-11-29 – 2021-11-30 (×3): 6 [IU] via SUBCUTANEOUS

## 2021-11-29 MED ORDER — IPRATROPIUM-ALBUTEROL 0.5-2.5 (3) MG/3ML IN SOLN
3.0000 mL | RESPIRATORY_TRACT | Status: DC | PRN
Start: 2021-11-29 — End: 2021-12-04

## 2021-11-29 MED ORDER — PERFLUTREN LIPID MICROSPHERE
1.0000 mL | INTRAVENOUS | Status: AC | PRN
  Administered 2021-11-29: 3 mL via INTRAVENOUS

## 2021-11-29 NOTE — Evaluation (Addendum)
Physical Therapy Evaluation Patient Details Name: Wayne [name] MRN: [id number] DOB: [birth year] Today's Date: 11/29/2021  History of Present Illness  Pt is an 81 y.o. male admitted 11/11/21 for AMS, R foot ulcer and swelling for 5 days. R foot MRI showed fx at base of 2nd toe proximal phalanx; plan for medical management (WBAT in surgical shoe). S/p RLE angiogram on 8/18 revealing PAD without intervention. PMH includes CAD s/p stent, DM2, HTN.   Clinical Impression  Patient sitting at edge of bed with nursing upon therapist entry. Patient agreeable to participating in PT evaluation today. Patient's daughter and ex-wife/friend present throughout session. During subjective eval, it was determined patient has at least 10 stairs to enter his home and his bedroom is upstairs with no place on the first floor for a bedroom. Family reports patient has had 3 falls in the last six months that they know of. Patient transferred demonstrating slow, labored movement to complete power up with left hip pain complaints and required cues for step sequencing and hand placement with use of RW. Patient ambulated demonstrating a slow, labored cadence with RW demonstrating antalgia over LLE. Patient required cues for step sequencing, hand placement and to walk within base of support of RW. Patient limited by pain.  Patient would continue to benefit from skilled physical therapy in current environment and next venue to continue return to prior function and increase strength, endurance, balance, coordination, and functional mobility and gait skills.       Recommendations for follow up therapy are one component of a multi-disciplinary discharge planning process, led by the attending physician.  Recommendations may be updated based on patient status, additional functional criteria and insurance authorization.  Follow Up Recommendations Skilled nursing-short term rehab (<3 hours/day) Can patient physically be transported by  private vehicle: Yes    Assistance Recommended at Discharge Frequent or constant Supervision/Assistance  Patient can return home with the following  Assistance with cooking/housework;Assist for transportation;Help with stairs or ramp for entrance;A little help with walking and/or transfers;A little help with bathing/dressing/bathroom    Equipment Recommendations None recommended by PT  Recommendations for Other Services       Functional Status Assessment Patient has had a recent decline in their functional status and demonstrates the ability to make significant improvements in function in a reasonable and predictable amount of time.     Precautions / Restrictions Precautions Precautions: Fall Precaution Comments: patient and family reports 3 falls that they know of in the last six months Restrictions Weight Bearing Restrictions: No      Mobility  Bed Mobility Overal bed mobility: Needs Assistance Bed Mobility: Supine to Sit     Supine to sit: Min assist, HOB elevated     General bed mobility comments: assist to pull up to sitting at EOB    Transfers Overall transfer level: Needs assistance Equipment used: Rolling walker (2 wheels) Transfers: Sit to/from Stand, Bed to chair/wheelchair/BSC Sit to Stand: Min guard   Step pivot transfers: Min guard       General transfer comment: slow, labored movement to complete power up with left hip pain complaints; cues for step sequencing and hand placement with use of RW    Ambulation/Gait Ambulation/Gait assistance: Min guard Gait Distance (Feet): 40 Feet Assistive device: Rolling walker (2 wheels) Gait Pattern/deviations: Step-through pattern, Decreased step length - right, Decreased step length - left, Decreased stance time - left, Decreased stride length, Decreased weight shift to left, Trunk flexed, Wide base of support, Antalgic Gait velocity:  decreased     General Gait Details: slow, labored cadence with RW  demonstrating antalgia over LLE; cues for step sequencing, hand placement and to walk within base of support; limiteed by pain; on room air throughout  Stairs            Wheelchair Mobility    Modified Rankin (Stroke Patients Only)       Balance Overall balance assessment: History of Falls, Mild deficits observed, not formally tested           Pertinent Vitals/Pain Pain Assessment Pain Assessment: Faces Faces Pain Scale: Hurts even more Pain Location: left hip Pain Intervention(s): Limited activity within patient's tolerance, Monitored during session, Repositioned    Home Living Family/patient expects to be discharged to:: Private residence Living Arrangements: Alone Available Help at Discharge: Friend(s);Family;Available PRN/intermittently Type of Home: House Home Access: Stairs to enter Entrance Stairs-Rails: Right Entrance Stairs-Number of Steps: 10 Alternate Level Stairs-Number of Steps: 10 Home Layout: Two level Home Equipment: Rolling Walker (2 wheels);BSC/3in1;Hand held shower head;Grab bars - tub/shower;Grab bars - toilet;Cane - single point;Shower seat      Prior Function Prior Level of Function : Independent/Modified Independent         Hand Dominance        Extremity/Trunk Assessment   Upper Extremity Assessment Upper Extremity Assessment: Generalized weakness    Lower Extremity Assessment Lower Extremity Assessment: Generalized weakness;LLE deficits/detail LLE Deficits / Details: mulitple bruises noted throughout patient's left side, arm, leg and trunk    Cervical / Trunk Assessment Cervical / Trunk Assessment: Normal  Communication   Communication: No difficulties  Cognition Arousal/Alertness: Awake/alert Behavior During Therapy: WFL for tasks assessed/performed Overall Cognitive Status: History of cognitive impairments - at baseline        General Comments      Exercises     Assessment/Plan    PT Assessment Patient needs  continued PT services  PT Problem List Decreased strength;Decreased mobility;Decreased safety awareness;Decreased activity tolerance;Decreased balance;Decreased knowledge of use of DME;Pain       PT Treatment Interventions DME instruction;Therapeutic exercise;Balance training;Gait training;Therapeutic activities;Patient/family education    PT Goals (Current goals can be found in the Care Plan section)  Acute Rehab PT Goals Patient Stated Goal: Go to rehab prior to going home. PT Goal Formulation: With patient/family Time For Goal Achievement: 12/13/21 Potential to Achieve Goals: Fair    Frequency Min 3X/week        AM-PAC PT "6 Clicks" Mobility  Outcome Measure Help needed turning from your back to your side while in a flat bed without using bedrails?: A Little Help needed moving from lying on your back to sitting on the side of a flat bed without using bedrails?: A Little Help needed moving to and from a bed to a chair (including a wheelchair)?: A Little Help needed standing up from a chair using your arms (e.g., wheelchair or bedside chair)?: A Little Help needed to walk in hospital room?: A Little Help needed climbing 3-5 steps with a railing? : A Lot 6 Click Score: 17    End of Session Equipment Utilized During Treatment: Gait belt Activity Tolerance: Patient tolerated treatment well;Patient limited by pain Patient left: in chair;with family/visitor present;with call bell/phone within reach Nurse Communication: Mobility status PT Visit Diagnosis: Unsteadiness on feet (R26.81);Other abnormalities of gait and mobility (R26.89);Muscle weakness (generalized) (M62.81);Repeated falls (R29.6)    Time: 1341-1415 PT Time Calculation (min) (ACUTE ONLY): 34 min   Charges:     PT Treatments $Therapeutic  Activity: 8-22 mins        Katina Dung. Hartnett-Rands, MS, PT [facility] [phone]  Britta Mccreedy  Hartnett-Rands 11/29/2021, 2:33 PM

## 2021-11-29 NOTE — Progress Notes (Signed)
*  PRELIMINARY RESULTS* Echocardiogram 2D Echocardiogram has been performed with [facility].  Stacey Drain 11/29/2021, 10:25 AM

## 2021-11-29 NOTE — Plan of Care (Signed)
  Problem: Acute Rehab PT Goals(only PT should resolve) Goal: Pt will Roll Supine to Side Outcome: Progressing Flowsheets (Taken 11/29/2021 1432) Pt will Roll Supine to Side: min guard Goal: Pt Will Go Supine/Side To Sit Outcome: Progressing Flowsheets (Taken 11/29/2021 1432) Pt will go Supine/Side to Sit:  with minimal assist  with min guard assist  with HOB elevated Goal: Pt Will Go Sit To Supine/Side Outcome: Progressing Flowsheets (Taken 11/29/2021 1433) Pt will go Sit to Supine/Side:  with min guard assist  with minimal assist  with HOB  elevated Goal: Patient Will Transfer Sit To/From Stand Outcome: Progressing Flowsheets (Taken 11/29/2021 1433) Patient will transfer sit to/from stand: with min guard assist Goal: Pt Will Transfer Bed To Chair/Chair To Bed Outcome: Progressing Flowsheets (Taken 11/29/2021 1433) Pt will Transfer Bed to Chair/Chair to Bed: min guard assist Goal: Pt Will Ambulate Outcome: Progressing Flowsheets (Taken 11/29/2021 1433) Pt will Ambulate:  50 feet  with min guard assist  with minimal assist  with least restrictive assistive device   Britta Mccreedy D. Hartnett-Rands, MS, PT [facility] [phone] 11/29/2021

## 2021-11-29 NOTE — TOC Initial Note (Signed)
Transition of Care [facility]) - Initial/Assessment Note    Patient Details  Name: Wayne [name] MRN: [id number] Date of Birth: 19-Apr-1940  Transition of Care [facility]) CM/SW Contact:    Lorri Frederick, LCSW Phone Number: 11/29/2021, 3:08 PM  Clinical Narrative:        CSW spoke with pt, daughter Susann Givens by phone to discuss DC recommendation for SNF.  Permission given to speak with everyone present.  Pt is agreeable to SNF, would like [facility].  Daughter [name] will be primary contact.  Pt lives home alone but with significant support from the above individuals.  [facility] HH currently in place but were just getting started when pt was readmitted.  Pt is vaccinated for covid but not boosted.             Expected Discharge Plan: Skilled Nursing Facility Barriers to Discharge: Continued Medical Work up, SNF Pending bed offer   Patient Goals and CMS Choice Patient states their goals for this hospitalization and ongoing recovery are:: "be able to do my regular household duties"   Choice offered to / list presented to : Patient, Adult Children (daughter [name])  Expected Discharge Plan and Services Expected Discharge Plan: Skilled Nursing Facility In-house Referral: Clinical Social Work   Post Acute Care Choice: Skilled Nursing Facility Living arrangements for the past 2 months: Single Family Home                                      Prior Living Arrangements/Services Living arrangements for the past 2 months: Single Family Home Lives with:: Self Patient language and need for interpreter reviewed:: Yes Do you feel safe going back to the place where you live?: Yes      Need for Family Participation in Patient Care: Yes (Comment) Care giver support system in place?: Yes (comment) Current home services: Home PT [facility]) Criminal Activity/Legal Involvement Pertinent to Current Situation/Hospitalization: No - Comment as needed  Activities of Daily Living       Permission Sought/Granted Permission sought to share information with : Family Supports Permission granted to share information with : Yes, Verbal Permission Granted  Share Information with NAME: daughter Susann Givens  Permission granted to share info w AGENCY: SNF        Emotional Assessment Appearance::  (phone contact) Attitude/Demeanor/Rapport: Engaged Affect (typically observed): Appropriate, Pleasant Orientation: : Oriented to Self, Oriented to Place, Oriented to Situation Alcohol / Substance Use: Not Applicable Psych Involvement: No (comment)  Admission diagnosis:  Subdural hematoma (HCC) [S06.5XAA] Acute respiratory failure with hypoxia (HCC) [J96.01] Multifocal pneumonia [J18.9] Patient Active Problem List   Diagnosis Date Noted   Acute respiratory failure with hypoxia (HCC) 11/28/2021   Multifocal pneumonia 11/28/2021   Dyslipidemia 11/28/2021   Fall at home 11/28/2021   Subdural hematoma (HCC) 11/28/2021   Failure to thrive in adult 11/28/2021   Lactic acidosis 11/28/2021   B12 deficiency 11/28/2021   B12 deficiency anemia 11/28/2021   PAD (peripheral artery disease) (HCC) 11/28/2021   Diabetic neuropathy (HCC) 11/28/2021   Cellulitis of right foot 11/11/2021   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  [place] [address] Phone: [phone] Fax: [phone]     Social Determinants of Health (SDOH) Interventions    Readmission Risk Interventions    11/12/2021    2:59 PM  Readmission Risk Prevention Plan  Post Dischage Appt Complete  Medication Screening Complete  Transportation Screening Complete

## 2021-11-29 NOTE — Progress Notes (Signed)
PROGRESS NOTE   Jesten Cappuccio  LKG:[id number] DOB: 1941-01-16 DOA: 11/28/2021 PCP: Patient, No Pcp Per   Chief Complaint  Patient presents with   Fall   Level of care: Stepdown  Brief Admission History:  81 year old gentleman with type 2 diabetes mellitus, coronary artery disease status post stenting, hypertension, peripheral vascular disease, peripheral neuropathy, recent right second toe phalanx fracture, cellulitis right foot and leg, former smoker recently discharged from [facility] after being hospitalized for right foot cellulitis, metabolic encephalopathy.  He reports that he had been doing fairly well at home.  He lives alone but has hired help and close family members.  He reports that he was trying to walk up the stairs he believes yesterday when he apparently tripped and fell.  He was down on the floor for an unknown period of time and was not able to get up.  Apparently his girlfriend found him on the floor and called EMS.  Patient was noted to be confused and complained of shortness of breath.  He was hypoxic and his blood sugar was elevated.  His pulse ox was 85% on room air.  He had a small abrasion to the right elbow.  Patient was unsure if he hit his head however he was noted to have a bruise on the scalp.  His CT scan revealed minimal left parafalcine subdural hemorrhage.  Neurosurgery was consulted and they recommended repeat CT in 6 hours but patient can remain at [facility].  He also had a CT of the right hip that did not show acute fracture.  He was noted to have a multifocal pneumonia on chest x-ray.  He has a new oxygen requirement and he was started on steroids and antibiotics.  Admission was requested for further management.   Assessment and Plan: * Acute respiratory failure with hypoxia (HCC) - Secondary to multifocal pneumonia, continue IV antibiotics -Status post IV Solu-Medrol x1 dose given in ED -Continue supplemental oxygen, wean as needed -Continue  bronchodilators as ordered - Admitting to stepdown ICU for closer monitoring  Subdural hematoma [facility]) - Neurosurgery was consulted and recommended repeat CT scan in 6 hours which has been completed and hemorrhage remains minimal and stable with no enlargement  Diabetic neuropathy (HCC) - Resume home gabapentin  PAD (peripheral artery disease) (HCC) - He was evaluated by vascular surgery at [facility] during recent hospitalization and not felt to be a candidate for invasive procedures and they had recommended that he take aspirin 81 mg daily which is temporarily on hold  B12 deficiency anemia - Hemoglobin stable from recent testing  B12 deficiency - Follow-up B12 level -She received supplementation during the recent hospitalization at [facility]  Lactic acidosis - Secondary to hypoxia, hydrated with IV fluid boluses in the ED  Failure to thrive in adult - Precipitous decline in recent weeks, requesting palliative medicine consultation for further goals of care discussions  Fall at home - Remains high fall risk, PT evaluation requested, fall precautions ordered  Dyslipidemia - Discussed with daughter she decided that she did not want her father taking a statin medication so she has not been giving him atorvastatin and requested that it not be given to him in the hospital  Multifocal pneumonia - Broad-spectrum antibiotic coverage ordered, follow-up blood cultures, follow-up procalcitonin testing  CAD S/P percutaneous coronary angioplasty - Resume home cardiac medications  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  We are temporarily holding his oral antibiotics  while he is on IV antibiotics for pneumonia.  Continue local wound care.  DVT prophylaxis: SCDs  Code Status: Full  Family Communication: daughter updated 9/2 Disposition: Status is: Inpatient Remains inpatient appropriate because: IV antibiotics   Consultants:   Procedures:  TTE pending Antimicrobials:  Cefepime 9/2>>   Subjective: Pt reports that he really wants to go home.  He says his breathing is better today.  He denies CP. He denies SOB. Objective: Vitals:   11/29/21 0800 11/29/21 0837 11/29/21 0900 11/29/21 1000  BP: 134/85  (!) 125/59 (!) 152/78  Pulse: 89  97 92  Resp: (!) 23  17 16   Temp:  98.1 F (36.7 C)    TempSrc:  Oral    SpO2: 99%  94% 97%  Weight:      Height:        Intake/Output Summary (Last 24 hours) at 11/29/2021 1125 Last data filed at 11/29/2021 0600 Gross per 24 hour  Intake 491.12 ml  Output 500 ml  Net -8.88 ml   Filed Weights   11/28/21 0858 11/28/21 1820  Weight: 83.9 kg 89.8 kg   Examination:  General exam: Appears calm and comfortable  Respiratory system: Clear to auscultation. Respiratory effort normal. Cardiovascular system: normal S1 & S2 heard. No JVD, murmurs, rubs, gallops or clicks. No pedal edema. Gastrointestinal system: Abdomen is nondistended, soft and nontender. No organomegaly or masses felt. Normal bowel sounds heard. Central nervous system: Alert and oriented. No focal neurological deficits. Extremities: Symmetric 5 x 5 power. Skin: No rashes, lesions or ulcers. Psychiatry: Judgement and insight appear normal. Mood & affect appropriate.   Data Reviewed: I have personally reviewed following labs and imaging studies  CBC: Recent Labs  Lab 11/28/21 0925 11/29/21 0328  WBC 10.8* 9.9  NEUTROABS 8.7* 8.6*  HGB 11.4* 10.2*  HCT 36.2* 32.5*  MCV 94.0 92.9  PLT 197 169    Basic Metabolic Panel: Recent Labs  Lab 11/28/21 0925 11/29/21 0328  NA 138 138  K 4.3 4.1  CL 108 111  CO2 22 18*  GLUCOSE 223* 263*   BUN 22 27*  CREATININE 1.13 1.17  CALCIUM 8.2* 8.1*  MG  --  2.1    CBG: Recent Labs  Lab 11/28/21 1704 11/28/21 2043 11/29/21 0242 11/29/21 0730 11/29/21 1121  GLUCAP 233* 274* 233* 211* 145*    Recent Results (from the past 240 hour(s))  Blood culture (routine x 2)     Status: None (Preliminary result)   Collection Time: 11/28/21  9:08 AM   Specimen: Right Antecubital; Blood  Result Value Ref Range Status   Specimen Description   Final    RIGHT ANTECUBITAL BOTTLES DRAWN AEROBIC AND ANAEROBIC   Special Requests   Final    Blood Culture results may not be optimal due to an excessive volume of blood received in culture bottles   Culture   Final    NO GROWTH < 24 HOURS Performed at [facility], [address] [place], [place]    Report Status PENDING  Incomplete  Blood culture (routine x 2)     Status: None (Preliminary result)   Collection Time: 11/28/21  9:20 AM  Specimen: Left Antecubital; Blood  Result Value Ref Range Status   Specimen Description   Final    LEFT ANTECUBITAL BOTTLES DRAWN AEROBIC AND ANAEROBIC   Special Requests Blood Culture adequate volume  Final   Culture   Final    NO GROWTH < 24 HOURS Performed at [facility], [address] [place], [place] [address]    Report Status PENDING  Incomplete  Resp Panel by RT-PCR (Flu A&B, Covid) Anterior Nasal Swab     Status: None   Collection Time: 11/28/21 10:37 AM   Specimen: Anterior Nasal Swab  Result Value Ref Range Status   SARS Coronavirus 2 by RT PCR NEGATIVE NEGATIVE Final    Comment: (NOTE) SARS-CoV-2 target nucleic acids are NOT DETECTED.  The SARS-CoV-2 RNA is generally detectable in upper respiratory specimens during the acute phase of infection. The lowest concentration of SARS-CoV-2 viral copies this assay can detect is 138 copies/mL. A negative result does not preclude SARS-Cov-2 infection and should not be used as the sole basis for treatment or other patient management  decisions. A negative result may occur with  improper specimen collection/handling, submission of specimen other than nasopharyngeal swab, presence of viral mutation(s) within the areas targeted by this assay, and inadequate number of viral copies(<138 copies/mL). A negative result must be combined with clinical observations, patient history, and epidemiological information. The expected result is Negative.  Fact Sheet for Patients:  EntrepreneurPulse.com.au  Fact Sheet for Healthcare Providers:  IncredibleEmployment.be  This test is no t yet approved or cleared by the [place] FDA and  has been authorized for detection and/or diagnosis of SARS-CoV-2 by FDA under an Emergency Use Authorization (EUA). This EUA will remain  in effect (meaning this test can be used) for the duration of the COVID-19 declaration under Section 564(b)(1) of the Act, 21 U.S.C.section 360bbb-3(b)(1), unless the authorization is terminated  or revoked sooner.       Influenza A by PCR NEGATIVE NEGATIVE Final   Influenza B by PCR NEGATIVE NEGATIVE Final    Comment: (NOTE) The Xpert Xpress SARS-CoV-2/FLU/RSV plus assay is intended as an aid in the diagnosis of influenza from Nasopharyngeal swab specimens and should not be used as a sole basis for treatment. Nasal washings and aspirates are unacceptable for Xpert Xpress SARS-CoV-2/FLU/RSV testing.  Fact Sheet for Patients: EntrepreneurPulse.com.au  Fact Sheet for Healthcare Providers: IncredibleEmployment.be  This test is not yet approved or cleared by the [place] FDA and has been authorized for detection and/or diagnosis of SARS-CoV-2 by FDA under an Emergency Use Authorization (EUA). This EUA will remain in effect (meaning this test can be used) for the duration of the COVID-19 declaration under Section 564(b)(1) of the Act, 21 U.S.C. section 360bbb-3(b)(1), unless the  authorization is terminated or revoked.  Performed at [facility], [address], [place], [place] [address]   MRSA Next Gen by PCR, Nasal     Status: None   Collection Time: 11/28/21  1:01 PM   Specimen: Nasal Mucosa; Nasal Swab  Result Value Ref Range Status   MRSA by PCR Next Gen NOT DETECTED NOT DETECTED Final    Comment: (NOTE) The GeneXpert MRSA Assay (FDA approved for NASAL specimens only), is one component of a comprehensive MRSA colonization surveillance program. It is not intended to diagnose MRSA infection nor to guide or monitor treatment for MRSA infections. Test performance is not FDA approved in patients less than 75 years old. Performed at [facility], [address] [place],  [place] [address]      Radiology Studies: CT HEAD WO CONTRAST  Result Date: 11/28/2021 CLINICAL DATA:  Follow-up subdural hematoma EXAM: CT HEAD WITHOUT CONTRAST TECHNIQUE: Contiguous axial images were obtained from the base of the skull through the vertex without intravenous contrast. RADIATION DOSE REDUCTION: This exam was performed according to the departmental dose-optimization program which includes automated exposure control, adjustment of the mA and/or kV according to patient size and/or use of iterative reconstruction technique. COMPARISON:  11/28/2021, 10:17 a.m. FINDINGS: Brain: Unchanged, minimal left parafalcine subdural hemorrhage (series 4, image 22). No evidence of acute infarction, new hemorrhage, hydrocephalus, new extra-axial collection or mass lesion/mass effect. Vascular: No hyperdense vessel or unexpected calcification. Skull: Normal. Negative for fracture or focal lesion. Sinuses/Orbits: No acute finding. Other: Large soft tissue contusion of the left scalp vertex (series 4, image 15). IMPRESSION: 1. Unchanged, minimal left parafalcine subdural hemorrhage. No new or enlarging hemorrhage. 2. Large soft tissue contusion of the left scalp vertex. Electronically Signed   By: Delanna Ahmadi M.D.   On: 11/28/2021 16:15   CT Head Wo Contrast  Addendum Date: 11/28/2021   ADDENDUM REPORT: 11/28/2021 10:42 ADDENDUM: Study discussed by telephone with PA TAMMY TRIPLETT on 11/28/2021 at 1037 hours. Electronically Signed   By: Genevie Ann M.D.   On: 11/28/2021 10:42   Result Date: 11/28/2021 CLINICAL DATA:  81 year old male found down. EXAM: CT HEAD WITHOUT CONTRAST TECHNIQUE: Contiguous axial images were obtained from the base of the skull through the vertex without intravenous contrast. RADIATION DOSE REDUCTION: This exam was performed according to the departmental dose-optimization program which includes automated exposure control, adjustment of the mA and/or kV according to patient size and/or use of iterative reconstruction technique. COMPARISON:  Head CT 11/12/2021. FINDINGS: Brain: Trace anterior para falcine subdural blood suspected on series 3, image 18. No mass effect. No other acute intracranial hemorrhage identified. No midline shift, ventriculomegaly, mass effect, evidence of mass lesion, or evidence of cortically based acute infarction. No IVH. Stable gray-white matter differentiation throughout the brain. Vascular: Calcified atherosclerosis at the skull base. No suspicious intracranial vascular hyperdensity. Skull: Stable.  No acute osseous abnormality identified. Sinuses/Orbits: Visualized paranasal sinuses and mastoids are stable and well aerated. Other: Broad-based new left posterior convexity scalp hematoma. Underlying calvarium appears stable and intact. Stable orbits soft tissues. Calcified scalp vessel atherosclerosis. IMPRESSION: 1. Trace anterior para-falcine subdural blood no mass effect. No other acute intracranial abnormality. 2. Acute left posterior convexity scalp hematoma without underlying skull fracture. Electronically Signed: By: Genevie Ann M.D. On: 11/28/2021 10:34   CT Hip Right Wo Contrast  Result Date: 11/28/2021 CLINICAL DATA:  Patient BIB by [facility] after girlfriend to  home this am to find patient on floor. Patient reports that he fell walking up steps around 0730 however, EMS believes that patient may have fallen overnight. Patient is confused on arrival. Found on second story of home after he crawled to couch. Complaining of right hip/lower back pain from fall. EXAM: CT OF THE RIGHT HIP WITHOUT CONTRAST TECHNIQUE: Multidetector CT imaging of the right hip was performed according to the standard protocol. Multiplanar CT image reconstructions were also generated. RADIATION DOSE REDUCTION: This exam was performed according to the departmental dose-optimization program which includes automated exposure control, adjustment of the mA and/or kV according to patient size and/or use of iterative reconstruction technique. COMPARISON:  None Available. FINDINGS: Bones/Joint/Cartilage No fracture.  No bone lesion. Superolateral right hip joint space narrowing. Small marginal spurs from the base  of the right femoral head and bony prominence from the superior right acetabulum, findings consistent with osteoarthritis. No convincing joint effusion. Ligaments Suboptimally assessed by CT. Muscles and Tendons No evidence of a muscle hematoma/injury. Tendons are grossly intact. Soft tissues No acute findings. IMPRESSION: 1. No fracture or acute finding. 2. Mild right hip joint osteoarthritis. Electronically Signed   By: Lajean Manes M.D.   On: 11/28/2021 10:39   DG Chest Port 1 View  Result Date: 11/28/2021 CLINICAL DATA:  Patient found on the floor this morning. Patient reports that he fell walking up steps this morning. EXAM: PORTABLE CHEST 1 VIEW COMPARISON:  11/11/2021. FINDINGS: Cardiac silhouette is normal in size. No mediastinal or hilar masses. Thickened bilateral interstitial markings. Mild hazy airspace opacity projects in the mid to lower lungs. Small pleural effusions suspected. No pneumothorax. Skeletal structures are grossly intact. IMPRESSION: 1. Interstitial thickening with hazy  lung opacities and probable small effusions. Suspect pulmonary edema, with multifocal pneumonia in the differential diagnosis. Electronically Signed   By: Lajean Manes M.D.   On: 11/28/2021 10:35    Scheduled Meds:  Chlorhexidine Gluconate Cloth  6 each Topical Daily   guaiFENesin  600 mg Oral BID   insulin aspart  0-15 Units Subcutaneous TID WC   insulin aspart  6 Units Subcutaneous TID WC   ipratropium-albuterol  3 mL Nebulization Q6H   linagliptin  5 mg Oral Daily   lisinopril  10 mg Oral Daily   nitroGLYCERIN  0.2 mg Transdermal Daily   Continuous Infusions:  ceFEPime (MAXIPIME) IV Stopped (11/29/21 0310)     LOS: 1 day   Time spent: 35 mins  Clydell Alberts Wynetta Emery, MD How to contact the [facility] Attending or Consulting provider [place] or covering provider during after hours [place], for this patient?  Check the care team in [facility] and look for a) attending/consulting TRH provider listed and b) the [facility] team listed Log into [email or web address] and use [place] universal password to access. If you do not have the password, please contact the hospital operator. Locate the [facility] provider you are looking for under [facility] and page to a number that you can be directly reached. If you still have difficulty reaching the provider, please page the [facility] (Director on Call) for the Hospitalists listed on amion for assistance.  11/29/2021, 11:25 AM

## 2021-11-29 NOTE — [place] FL2 (Signed)
[place] MEDICAID FL2 LEVEL OF CARE SCREENING TOOL     IDENTIFICATION  Patient Name: Wayne [name] Birthdate: July 25, 1940 Sex: male Admission Date (Current Location): 11/28/2021  [facility] and [place] Number:  Producer, television/film/video and Address:  [facility], [address], [place], [place] [address]      Provider Number: [id number]  Attending Physician Name and Address:  Cleora Fleet, MD  Relative Name and Phone Number:  Pearly, [place]ito Daughter [phone]  [phone]    Current Level of Care: Hospital Recommended Level of Care: Skilled Nursing Facility Prior Approval Number:    Date Approved/Denied:   PASRR Number: [id number]  Discharge Plan: SNF    Current Diagnoses: Patient Active Problem List   Diagnosis Date Noted   Acute respiratory failure with hypoxia (HCC) 11/28/2021   Multifocal pneumonia 11/28/2021   Dyslipidemia 11/28/2021   Fall at home 11/28/2021   Subdural hematoma (HCC) 11/28/2021   Failure to thrive in adult 11/28/2021   Lactic acidosis 11/28/2021   B12 deficiency 11/28/2021   B12 deficiency anemia 11/28/2021   PAD (peripheral artery disease) (HCC) 11/28/2021   Diabetic neuropathy (HCC) 11/28/2021   Cellulitis of right foot 11/11/2021   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) 11/11/2021   CAD S/P percutaneous coronary angioplasty 11/11/2021   Cellulitis 11/11/2021    Orientation RESPIRATION BLADDER Height & Weight     Self, Situation, Place  O2 External catheter, Continent Weight: 197 lb 15.6 oz (89.8 kg) Height:  6' (182.9 cm)  BEHAVIORAL SYMPTOMS/MOOD NEUROLOGICAL BOWEL NUTRITION STATUS      Continent Diet (see discharge summary)  AMBULATORY STATUS COMMUNICATION OF NEEDS Skin   Supervision Verbally Other (Comment) (ecchymosis, redness)                       Personal Care Assistance Level of Assistance  Bathing, Feeding, Dressing Bathing Assistance: Limited  assistance Feeding assistance: Independent Dressing Assistance: Limited assistance     Functional Limitations Info  Sight, Hearing, Speech Sight Info: Adequate Hearing Info: Adequate Speech Info: Adequate    SPECIAL CARE FACTORS FREQUENCY  PT (By licensed PT), OT (By licensed OT)     PT Frequency: 5x week OT Frequency: 5x week            Contractures Contractures Info: Not present    Additional Factors Info  Code Status, Allergies Code Status Info: full Allergies Info: NKA           Current Medications (11/29/2021):  This is the current hospital active medication list Current Facility-Administered Medications  Medication Dose Route Frequency Provider Last Rate Last Admin   acetaminophen (TYLENOL) tablet 650 mg  650 mg Oral Q6H PRN Johnson, Clanford L, MD   650 mg at 11/29/21 0344   Or   acetaminophen (TYLENOL) suppository 650 mg  650 mg Rectal Q6H PRN Johnson, Clanford L, MD       ceFEPIme (MAXIPIME) 2 g in sodium chloride 0.9 % 100 mL IVPB  2 g Intravenous Q12H Johnson, Clanford L, MD 200 mL/hr at 11/29/21 1446 2 g at 11/29/21 1446   Chlorhexidine Gluconate Cloth 2 % PADS 6 each  6 each Topical Daily Emokpae, Courage, MD   6 each at 11/29/21 1116   dextromethorphan (DELSYM) 30 MG/5ML liquid 30 mg  30 mg Oral BID PRN Johnson, Clanford L, MD       guaiFENesin (MUCINEX) 12 hr tablet 600 mg  600 mg Oral BID Johnson, Clanford  L, MD   600 mg at 11/29/21 1116   insulin aspart (novoLOG) injection 0-15 Units  0-15 Units Subcutaneous TID WC Johnson, Clanford L, MD   2 Units at 11/29/21 1212   insulin aspart (novoLOG) injection 6 Units  6 Units Subcutaneous TID WC Johnson, Clanford L, MD   6 Units at 11/29/21 1211   ipratropium-albuterol (DUONEB) 0.5-2.5 (3) MG/3ML nebulizer solution 3 mL  3 mL Nebulization Q6H Johnson, Clanford L, MD   3 mL at 11/29/21 1303   linagliptin (TRADJENTA) tablet 5 mg  5 mg Oral Daily Johnson, Clanford L, MD   5 mg at 11/29/21 1116   lisinopril (ZESTRIL)  tablet 10 mg  10 mg Oral Daily Johnson, Clanford L, MD   10 mg at 11/29/21 1116   nitroGLYCERIN (NITRODUR - Dosed in mg/24 hr) patch 0.2 mg  0.2 mg Transdermal Daily Johnson, Clanford L, MD   0.2 mg at 11/29/21 1115   ondansetron (ZOFRAN) tablet 4 mg  4 mg Oral Q6H PRN Johnson, Clanford L, MD       Or   ondansetron (ZOFRAN) injection 4 mg  4 mg Intravenous Q6H PRN Johnson, Clanford L, MD       oxyCODONE (Oxy IR/ROXICODONE) immediate release tablet 5 mg  5 mg Oral Q6H PRN Johnson, Clanford L, MD       polyethylene glycol (MIRALAX / GLYCOLAX) packet 17 g  17 g Oral Daily PRN Johnson, Clanford L, MD   17 g at 11/29/21 1454   traZODone (DESYREL) tablet 25 mg  25 mg Oral QHS PRN Cleora Fleet, MD         Discharge Medications: Please see discharge summary for a list of discharge medications.  Relevant Imaging Results:  Relevant Lab Results:   Additional Information SSN: [id number], pt is vaccinated for covid but not boosted  Dovie Kapusta, Einar Crow, LCSW

## 2021-11-30 DIAGNOSIS — I429 Cardiomyopathy, unspecified: Secondary | ICD-10-CM

## 2021-11-30 DIAGNOSIS — I2510 Atherosclerotic heart disease of native coronary artery without angina pectoris: Secondary | ICD-10-CM

## 2021-11-30 DIAGNOSIS — J9601 Acute respiratory failure with hypoxia: Secondary | ICD-10-CM | POA: Diagnosis not present

## 2021-11-30 DIAGNOSIS — L03115 Cellulitis of right lower limb: Secondary | ICD-10-CM | POA: Diagnosis not present

## 2021-11-30 DIAGNOSIS — Z7189 Other specified counseling: Secondary | ICD-10-CM

## 2021-11-30 DIAGNOSIS — Z9861 Coronary angioplasty status: Secondary | ICD-10-CM

## 2021-11-30 DIAGNOSIS — D519 Vitamin B12 deficiency anemia, unspecified: Secondary | ICD-10-CM | POA: Diagnosis not present

## 2021-11-30 DIAGNOSIS — I5031 Acute diastolic (congestive) heart failure: Secondary | ICD-10-CM

## 2021-11-30 DIAGNOSIS — Z515 Encounter for palliative care: Secondary | ICD-10-CM | POA: Diagnosis not present

## 2021-11-30 DIAGNOSIS — J189 Pneumonia, unspecified organism: Secondary | ICD-10-CM

## 2021-11-30 DIAGNOSIS — E538 Deficiency of other specified B group vitamins: Secondary | ICD-10-CM | POA: Diagnosis not present

## 2021-11-30 DIAGNOSIS — K5900 Constipation, unspecified: Secondary | ICD-10-CM | POA: Diagnosis present

## 2021-11-30 DIAGNOSIS — K5909 Other constipation: Secondary | ICD-10-CM | POA: Diagnosis present

## 2021-11-30 LAB — GLUCOSE, CAPILLARY
Glucose-Capillary: 92 mg/dL (ref 70–99)
Glucose-Capillary: 92 mg/dL (ref 70–99)
Glucose-Capillary: 210 mg/dL — ABNORMAL HIGH (ref 70–99)
Glucose-Capillary: 69 mg/dL — ABNORMAL LOW (ref 70–99)
Glucose-Capillary: 104 mg/dL — ABNORMAL HIGH (ref 70–99)

## 2021-11-30 LAB — BASIC METABOLIC PANEL
Anion gap: 6 (ref 5–15)
BUN: 32 mg/dL — ABNORMAL HIGH (ref 8–23)
CO2: 19 mmol/L — ABNORMAL LOW (ref 22–32)
Calcium: 7.9 mg/dL — ABNORMAL LOW (ref 8.9–10.3)
Chloride: 111 mmol/L (ref 98–111)
Creatinine, Ser: 1.21 mg/dL (ref 0.61–1.24)
GFR, Estimated: 60 mL/min — ABNORMAL LOW (ref >60)
Glucose, Bld: 90 mg/dL (ref 70–99)
Potassium: 4.2 mmol/L (ref 3.5–5.1)
Sodium: 136 mmol/L (ref 135–145)

## 2021-11-30 LAB — PROCALCITONIN: Procalcitonin: 0.19 ng/mL

## 2021-11-30 MED ORDER — BISACODYL 10 MG RE SUPP
10.0000 mg | Freq: Every day | RECTAL | Status: DC | PRN
Start: 2021-11-30 — End: 2021-12-04

## 2021-11-30 MED ORDER — INSULIN ASPART 100 UNIT/ML IJ SOLN
3.0000 [IU] | Freq: Three times a day (TID) | INTRAMUSCULAR | Status: DC
  Administered 2021-12-02 – 2021-12-04 (×4): 3 [IU] via SUBCUTANEOUS

## 2021-11-30 MED ORDER — FUROSEMIDE 10 MG/ML IJ SOLN
20.0000 mg | Freq: Every day | INTRAMUSCULAR | Status: DC
  Administered 2021-11-30 – 2021-12-01 (×2): 20 mg via INTRAVENOUS
  Filled 2021-11-30 (×2): qty 2

## 2021-11-30 MED ORDER — INSULIN ASPART 100 UNIT/ML IJ SOLN
0.0000 [IU] | Freq: Three times a day (TID) | INTRAMUSCULAR | Status: DC
  Administered 2021-12-01: 1 [IU] via SUBCUTANEOUS
  Administered 2021-12-01 (×2): 3 [IU] via SUBCUTANEOUS
  Administered 2021-12-02: 2 [IU] via SUBCUTANEOUS
  Administered 2021-12-02 – 2021-12-03 (×3): 1 [IU] via SUBCUTANEOUS
  Administered 2021-12-03: 2 [IU] via SUBCUTANEOUS
  Administered 2021-12-04 (×2): 1 [IU] via SUBCUTANEOUS

## 2021-11-30 MED ORDER — SENNOSIDES-DOCUSATE SODIUM 8.6-50 MG PO TABS
1.0000 | ORAL_TABLET | Freq: Every day | ORAL | Status: DC
  Administered 2021-11-30 – 2021-12-03 (×3): 1 via ORAL
  Filled 2021-11-30 (×4): qty 1

## 2021-11-30 NOTE — Assessment & Plan Note (Addendum)
--   added dulcolax suppository prn -- added nightly peri-colace  -- RN reported large BM 9/5

## 2021-11-30 NOTE — Progress Notes (Signed)
Hypoglycemic Event  CBG: 69  Treatment: 4 oz juice/soda  Symptoms: None  Follow-up CBG: Time:1640 CBG Result: 104  Possible Reasons for Event: Unknown  Comments/MD notified: Dr Laural Benes  Insulin adjusted per Dr Laural Benes orders    Wayne [name]

## 2021-11-30 NOTE — Assessment & Plan Note (Addendum)
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

## 2021-11-30 NOTE — Progress Notes (Signed)
Physical Therapy Treatment Patient Details Name: Wayne [name] MRN: [id number] DOB: [birth year] Today's Date: 11/30/2021   History of Present Illness Pt is an 81 y.o. male admitted 11/11/21 for AMS, R foot ulcer and swelling for 5 days. R foot MRI showed fx at base of 2nd toe proximal phalanx; plan for medical management (WBAT in surgical shoe). S/p RLE angiogram on 8/18 revealing PAD without intervention. PMH includes CAD s/p stent, DM2, HTN.    PT Comments    Pt supine in bed and willing to participate with PT today.  Pt limited by Lt hip and LBP pain, monitored pain and O2 saturation through session with some cueing required to improve breathing for O2 saturation %.  Min A with bed mobility and transfer training, gait training with RW.  Cueing to handplacement, to stand within walker, sequence to advance next foot.  Pt presents with slow labored movements during gait, no LOB.  Ambulated to [facility] then to chair.  Seated exercises complete for LE strengthening.  EOS pt reports pain reduced to 8/10, pt left in chair with call bell within reach.  RN aware of status.     Recommendations for follow up therapy are one component of a multi-disciplinary discharge planning process, led by the attending physician.  Recommendations may be updated based on patient status, additional functional criteria and insurance authorization.  Follow Up Recommendations  Skilled nursing-short term rehab (<3 hours/day)     Assistance Recommended at Discharge Frequent or constant Supervision/Assistance  Patient can return home with the following Assistance with cooking/housework;Assist for transportation;Help with stairs or ramp for entrance;A little help with walking and/or transfers;A little help with bathing/dressing/bathroom   Equipment Recommendations  None recommended by PT    Recommendations for Other Services       Precautions / Restrictions Precautions Precautions: Fall Precaution Comments: patient and  family reports 3 falls that they know of in the last six months Restrictions Weight Bearing Restrictions: No RLE Weight Bearing: Weight bearing as tolerated     Mobility  Bed Mobility Overal bed mobility: Needs Assistance Bed Mobility: Supine to Sit     Supine to sit: Min assist     General bed mobility comments: assist with Lt LE supine to sit on EOB    Transfers Overall transfer level: Needs assistance Equipment used: Rolling walker (2 wheels) Transfers: Sit to/from Stand, Bed to chair/wheelchair/BSC Sit to Stand: Min assist   Step pivot transfers: Min assist       General transfer comment: slow, labored movement to complete power up with left hip pain complaints; cues for step sequencing and hand placement with use of RW    Ambulation/Gait Ambulation/Gait assistance: Min guard Gait Distance (Feet): 40 Feet Assistive device: Rolling walker (2 wheels) Gait Pattern/deviations: Step-through pattern, Decreased step length - right, Decreased step length - left, Decreased stance time - left, Decreased stride length, Decreased weight shift to left, Trunk flexed, Wide base of support, Antalgic Gait velocity: decreased     General Gait Details: slow, labored cadence with RW demonstrating antalgia over LLE; cues for step sequencing, hand placement and to walk within base of support; limited by pain; on 2L O2 A monitored through session   Stairs             Wheelchair Mobility    Modified Rankin (Stroke Patients Only)       Balance  Cognition Arousal/Alertness: Awake/alert Behavior During Therapy: WFL for tasks assessed/performed Overall Cognitive Status: History of cognitive impairments - at baseline                                          Exercises Total Joint Exercises Hip ABduction/ADduction: AROM, Both, Strengthening, 10 reps, Seated Long Arc Quad: AROM, Both, 10 reps,  Seated Marching in Standing: Seated, 10 reps, AROM, Strengthening    General Comments        Pertinent Vitals/Pain Pain Assessment Pain Score: 10-Worst pain ever Pain Location: Left hip and LBP, following transfer to chair and exercise reports pain reduced to 8/10 at EOS Pain Descriptors / Indicators: Aching Pain Intervention(s): Monitored during session, Limited activity within patient's tolerance, Repositioned    Home Living                          Prior Function            PT Goals (current goals can now be found in the care plan section)      Frequency    Min 3X/week      PT Plan Current plan remains appropriate    Co-evaluation              AM-PAC PT "6 Clicks" Mobility   Outcome Measure  Help needed turning from your back to your side while in a flat bed without using bedrails?: A Little Help needed moving from lying on your back to sitting on the side of a flat bed without using bedrails?: A Little Help needed moving to and from a bed to a chair (including a wheelchair)?: A Little Help needed standing up from a chair using your arms (e.g., wheelchair or bedside chair)?: A Little Help needed to walk in hospital room?: A Little Help needed climbing 3-5 steps with a railing? : A Lot 6 Click Score: 17    End of Session Equipment Utilized During Treatment: Gait belt Activity Tolerance: Patient tolerated treatment well;Patient limited by pain Patient left: in chair;with call bell/phone within reach Nurse Communication: Mobility status PT Visit Diagnosis: Unsteadiness on feet (R26.81);Other abnormalities of gait and mobility (R26.89);Muscle weakness (generalized) (M62.81);Repeated falls (R29.6)     Time: 1235-1300 PT Time Calculation (min) (ACUTE ONLY): 25 min  Charges:  $Therapeutic Activity: 23-37 mins                     Becky Sax, LPTA/CLT; CBIS [phone]  Juel Burrow 11/30/2021, 1:03 PM

## 2021-11-30 NOTE — Progress Notes (Signed)
PROGRESS NOTE   Wayne [name]  ZCH:[id number] DOB: [birth year] DOA: 11/28/2021 PCP: Patient, No Pcp Per   Chief Complaint  Patient presents with   Fall   Level of care: Stepdown  Brief Admission History:  81 year old gentleman with type 2 diabetes mellitus, coronary artery disease status post stenting, hypertension, peripheral vascular disease, peripheral neuropathy, recent right second toe phalanx fracture, cellulitis right foot and leg, former smoker recently discharged from [facility] after being hospitalized for right foot cellulitis, metabolic encephalopathy.  He reports that he had been doing fairly well at home.  He lives alone but has hired help and close family members.  He reports that he was trying to walk up the stairs he believes yesterday when he apparently tripped and fell.  He was down on the floor for an unknown period of time and was not able to get up.  Apparently his girlfriend found him on the floor and called EMS.  Patient was noted to be confused and complained of shortness of breath.  He was hypoxic and his blood sugar was elevated.  His pulse ox was 85% on room air.  He had a small abrasion to the right elbow.  Patient was unsure if he hit his head however he was noted to have a bruise on the scalp.  His CT scan revealed minimal left parafalcine subdural hemorrhage.  Neurosurgery was consulted and they recommended repeat CT in 6 hours but patient can remain at [facility].  He also had a CT of the right hip that did not show acute fracture.  He was noted to have a multifocal pneumonia on chest x-ray.  He has a new oxygen requirement and he was started on steroids and antibiotics.  Admission was requested for further management.   Assessment and Plan: * Acute respiratory failure with hypoxia (HCC) - Secondary to multifocal pneumonia, continue IV antibiotics -Status post IV Solu-Medrol x1 dose given in ED -Continue supplemental oxygen, wean as needed -Continue  bronchodilators as ordered - Admitting to stepdown ICU for closer monitoring  Cardiomyopathy Severe Systolic Heart Failure -- TTE concerning with findings of severely diminished systolic heart function EF: LVEF 35% with grade 2 DD, regional wall motion abnormalities -- will request cardiology consultation  -- IV lasix 20 mg daily ordered  Subdural hematoma (HCC) - Neurosurgery was consulted and recommended repeat CT scan in 6 hours which has been completed and hemorrhage remains minimal and stable with no enlargement  Constipation -- added dulcolax suppository prn -- added nightly peri-colace   Diabetic neuropathy (HCC) - Resume home gabapentin  PAD (peripheral artery disease) (HCC) - He was evaluated by vascular surgery at [facility] during recent hospitalization and not felt to be a candidate for invasive procedures and they had recommended that he take aspirin 81 mg daily which is temporarily on hold  B12 deficiency anemia - Hemoglobin stable from recent testing  B12 deficiency - Follow-up B12 level: 515 -Pt received supplementation during the recent hospitalization at [facility]  Lactic acidosis - Secondary to hypoxia, hydrated with IV fluid boluses in the ED  Failure to thrive in adult - Precipitous decline in recent weeks, requesting palliative medicine consultation for further goals of care discussions  Fall at home - Remains high fall risk, PT evaluation requested, fall precautions ordered  Dyslipidemia - Discussed with daughter she decided that she did not want her father taking a statin medication so she has not been giving him atorvastatin and requested that it not be  given to him in the hospital  Multifocal pneumonia - Broad-spectrum antibiotic coverage ordered, follow-up blood cultures, follow-up procalcitonin testing  CAD S/P percutaneous coronary angioplasty - Resumed home cardiac medications  Type 2 diabetes mellitus with peripheral vascular disease  ([place]) - Continue SSI coverage and frequent CBG monitoring, hold home glipizide  CBG (last 3)  Recent Labs    11/30/21 0416 11/30/21 0718 11/30/21 1113  GLUCAP 92 92 210*     Cellulitis of right foot - He has been on antibiotics for this and he is followed closely by his podiatrist for this.  We are temporarily holding his oral antibiotics while he is on IV antibiotics for pneumonia.  Continue local wound care.  DVT prophylaxis: SCDs  Code Status: Full  Family Communication: daughter updated 9/2, 9/3 Disposition: Status is: Inpatient Remains inpatient appropriate because: IV antibiotics   Consultants:   Procedures:  TTE pending Antimicrobials:  Cefepime 9/2>>   Subjective: Pt c/o having constipation.   Objective: Vitals:   11/30/21 0707 11/30/21 0800 11/30/21 0904 11/30/21 1113  BP:  (!) 161/98 (!) 161/74   Pulse: 85 91  93  Resp: (!) 24 15  16   Temp: 97.8 F (36.6 C)   97.7 F (36.5 C)  TempSrc: Oral   Oral  SpO2: 97% 98%  96%  Weight:      Height:        Intake/Output Summary (Last 24 hours) at 11/30/2021 1200 Last data filed at 11/30/2021 0928 Gross per 24 hour  Intake 768.94 ml  Output 790 ml  Net -21.06 ml   Filed Weights   11/28/21 0858 11/28/21 1820 11/30/21 0417  Weight: 83.9 kg 89.8 kg 90.8 kg   Examination:  General exam: Appears calm and comfortable  Respiratory system: Clear to auscultation. Respiratory effort normal. Cardiovascular system: normal S1 & S2 heard. No JVD, murmurs, rubs, gallops or clicks. No pedal edema. Gastrointestinal system: Abdomen is nondistended, soft and nontender. No organomegaly or masses felt. Normal bowel sounds heard. Central nervous system: Alert and oriented. No focal neurological deficits. Extremities: Symmetric 5 x 5 power. Skin: No rashes, lesions or ulcers. Psychiatry: Judgement and insight appear normal. Mood & affect appropriate.   Data Reviewed: I have personally reviewed following labs and imaging  studies  CBC: Recent Labs  Lab 11/28/21 0925 11/29/21 0328  WBC 10.8* 9.9  NEUTROABS 8.7* 8.6*  HGB 11.4* 10.2*  HCT 36.2* 32.5*  MCV 94.0 92.9  PLT 197 [id number]    Basic Metabolic Panel: Recent Labs  Lab 11/28/21 0925 11/29/21 0328 11/30/21 0401  NA 138 138 136  K 4.3 4.1 4.2  CL 108 111 111  CO2 22 18* 19*  GLUCOSE 223* 263* 90  BUN 22 27* 32*  CREATININE 1.13 1.17 1.21  CALCIUM 8.2* 8.1* 7.9*  MG  --  2.1  --     CBG: Recent Labs  Lab 11/29/21 1518 11/29/21 2038 11/30/21 0416 11/30/21 0718 11/30/21 1113  GLUCAP 76 97 92 92 210*    Recent Results (from the past 240 hour(s))  Blood culture (routine x 2)     Status: None (Preliminary result)   Collection Time: 11/28/21  9:08 AM   Specimen: Right Antecubital; Blood  Result Value [place] Status   Specimen Description   Final    RIGHT ANTECUBITAL BOTTLES DRAWN AEROBIC AND ANAEROBIC   Special Requests   Final    Blood Culture results may not be optimal due to an excessive volume of blood received in  culture bottles   Culture   Final    NO GROWTH < 24 HOURS Performed at [facility], [address] [place], [place] [address]    Report Status PENDING  Incomplete  Blood culture (routine x 2)     Status: None (Preliminary result)   Collection Time: 11/28/21  9:20 AM   Specimen: Left Antecubital; Blood  Result Value [place] Status   Specimen Description   Final    LEFT ANTECUBITAL BOTTLES DRAWN AEROBIC AND ANAEROBIC   Special Requests Blood Culture adequate volume  Final   Culture   Final    NO GROWTH < 24 HOURS Performed at [facility], [address], [place], [place] [address]    Report Status PENDING  Incomplete  Resp Panel by RT-PCR (Flu A&B, Covid) Anterior Nasal Swab     Status: None   Collection Time: 11/28/21 10:37 AM   Specimen: Anterior Nasal Swab  Result Value [place] Status   SARS Coronavirus 2 by RT PCR NEGATIVE NEGATIVE Final    Comment: (NOTE) SARS-CoV-2 target nucleic acids are NOT  DETECTED.  The SARS-CoV-2 RNA is generally detectable in upper respiratory specimens during the acute phase of infection. The lowest concentration of SARS-CoV-2 viral copies this assay can detect is 138 copies/mL. A negative result does not preclude SARS-Cov-2 infection and should not be used as the sole basis for treatment or other patient management decisions. A negative result may occur with  improper specimen collection/handling, submission of specimen other than nasopharyngeal swab, presence of viral mutation(s) within the areas targeted by this assay, and inadequate number of viral copies(<138 copies/mL). A negative result must be combined with clinical observations, patient history, and epidemiological information. The expected result is Negative.  Fact Sheet for Patients:  EntrepreneurPulse.com.au  Fact Sheet for Healthcare Providers:  IncredibleEmployment.be  This test is no t yet approved or cleared by the [place] FDA and  has been authorized for detection and/or diagnosis of SARS-CoV-2 by FDA under an Emergency Use Authorization (EUA). This EUA will remain  in effect (meaning this test can be used) for the duration of the COVID-19 declaration under Section 564(b)(1) of the Act, 21 U.S.C.section 360bbb-3(b)(1), unless the authorization is terminated  or revoked sooner.       Influenza A by PCR NEGATIVE NEGATIVE Final   Influenza B by PCR NEGATIVE NEGATIVE Final    Comment: (NOTE) The Xpert Xpress SARS-CoV-2/FLU/RSV plus assay is intended as an aid in the diagnosis of influenza from Nasopharyngeal swab specimens and should not be used as a sole basis for treatment. Nasal washings and aspirates are unacceptable for Xpert Xpress SARS-CoV-2/FLU/RSV testing.  Fact Sheet for Patients: EntrepreneurPulse.com.au  Fact Sheet for Healthcare Providers: IncredibleEmployment.be  This test is not yet  approved or cleared by the [place] FDA and has been authorized for detection and/or diagnosis of SARS-CoV-2 by FDA under an Emergency Use Authorization (EUA). This EUA will remain in effect (meaning this test can be used) for the duration of the COVID-19 declaration under Section 564(b)(1) of the Act, 21 U.S.C. section 360bbb-3(b)(1), unless the authorization is terminated or revoked.  Performed at [facility], [address], [place], [place] [address]   MRSA Next Gen by PCR, Nasal     Status: None   Collection Time: 11/28/21  1:01 PM   Specimen: Nasal Mucosa; Nasal Swab  Result Value [place] Status   MRSA by PCR Next Gen NOT DETECTED NOT DETECTED Final    Comment: (NOTE)  The GeneXpert MRSA Assay (FDA approved for NASAL specimens only), is one component of a comprehensive MRSA colonization surveillance program. It is not intended to diagnose MRSA infection nor to guide or monitor treatment for MRSA infections. Test performance is not FDA approved in patients less than 73 years old. Performed at [facility], [address], [place], [place] [address]      Radiology Studies: ECHOCARDIOGRAM COMPLETE  Result Date: 11/29/2021    ECHOCARDIOGRAM REPORT   Patient Name:   ELLEN MAYOL Date of Exam: 11/29/2021 Medical Rec #:  [id number]         Height:       72.0 in Accession #:    [id number]        Weight:       198.0 lb Date of Birth:  1940/10/31        BSA:          2.121 m Patient Age:    80 years          BP:           136/61 mmHg Patient Gender: M                 HR:           81 bpm. [facility] Procedure: 2D Echo, Cardiac Doppler and Color Doppler Indications:    CAD Native Vessel I25.10, Congestive Heart Failure I50.9  History:        Patient has no prior history of Echocardiogram examinations.                 CAD; Risk Factors:Dyslipidemia, Diabetes and Hypertension. Hx                 S/P percutaneous coronary angioplasty.  Sonographer:    Celesta Gentile RCS  Referring Phys: [phone] Shreyan Hinz L Tavyn Kurka IMPRESSIONS  1. Left ventricular ejection fraction, by estimation, is 35%. The left ventricle has moderately decreased function. The left ventricle demonstrates regional wall motion abnormalities (see scoring diagram/findings for description). Left ventricular diastolic parameters are consistent with Grade II diastolic dysfunction (pseudonormalization).  2. Right ventricular systolic function is normal. The right ventricular size is normal. There is severely elevated pulmonary artery systolic pressure. The estimated right ventricular systolic pressure is 71.6 mmHg.  3. Left atrial size was mildly dilated.  4. The mitral valve is grossly normal. Trivial mitral valve regurgitation. No evidence of mitral stenosis.  5. The aortic valve is normal in structure. There is mild calcification of the aortic valve. Aortic valve regurgitation is not visualized. No aortic stenosis is present.  6. The inferior vena cava is dilated in size with <50% respiratory variability, suggesting right atrial pressure of 15 mmHg. Conclusion(s)/Recommendation(s): No left ventricular mural or apical thrombus/thrombi. Swirling apical contrast is seen indicative of slow flow. FINDINGS  Left Ventricle: Left ventricular ejection fraction, by estimation, is 35%. The left ventricle has moderately decreased function. The left ventricle demonstrates regional wall motion abnormalities. Definity contrast agent was given IV to delineate the left ventricular endocardial borders. The left ventricular internal cavity size was normal in size. There is no left ventricular hypertrophy. Left ventricular diastolic parameters are consistent with Grade II diastolic dysfunction (pseudonormalization).  LV Wall Scoring: The mid and distal anterior wall, mid and distal lateral wall, mid and distal anterior septum, mid anterolateral segment, mid inferoseptal segment, and apical inferior segment are hypokinetic. Right Ventricle: The  right ventricular size is normal. No increase in right ventricular wall  thickness. Right ventricular systolic function is normal. There is severely elevated pulmonary artery systolic pressure. The tricuspid regurgitant velocity is 3.76 m/s, and with an assumed right atrial pressure of 15 mmHg, the estimated right ventricular systolic pressure is 0000000 mmHg. Left Atrium: Left atrial size was mildly dilated. Right Atrium: Right atrial size was normal in size. Pericardium: Trivial pericardial effusion is present. Mitral Valve: The mitral valve is grossly normal. Trivial mitral valve regurgitation. No evidence of mitral valve stenosis. Tricuspid Valve: The tricuspid valve is normal in structure. Tricuspid valve regurgitation is mild . No evidence of tricuspid stenosis. Aortic Valve: The aortic valve is normal in structure. There is mild calcification of the aortic valve. Aortic valve regurgitation is not visualized. No aortic stenosis is present. Pulmonic Valve: The pulmonic valve was normal in structure. Pulmonic valve regurgitation is trivial. No evidence of pulmonic stenosis. Aorta: The aortic root is normal in size and structure. Ascending aorta measurements are within normal limits for age when indexed to body surface area. Venous: The inferior vena cava is dilated in size with less than 50% respiratory variability, suggesting right atrial pressure of 15 mmHg. IAS/Shunts: No atrial level shunt detected by color flow Doppler.  LEFT VENTRICLE PLAX 2D LVIDd:         4.80 cm   Diastology LVIDs:         3.70 cm   LV e' medial:    6.31 cm/s LV PW:         1.10 cm   LV E/e' medial:  16.6 LV IVS:        1.20 cm   LV e' lateral:   6.96 cm/s LVOT diam:     2.20 cm   LV E/e' lateral: 15.1 LV SV:         63 LV SV Index:   30 LVOT Area:     3.80 cm  RIGHT VENTRICLE RV S prime:     14.80 cm/s TAPSE (M-mode): 2.5 cm LEFT ATRIUM             Index        RIGHT ATRIUM           Index LA diam:        4.40 cm 2.07 cm/m   RA Area:      17.20 cm LA Vol (A2C):   68.6 ml 32.34 ml/m  RA Volume:   48.90 ml  23.05 ml/m LA Vol (A4C):   82.8 ml 39.03 ml/m LA Biplane Vol: 76.4 ml 36.01 ml/m  AORTIC VALVE LVOT Vmax:   70.50 cm/s LVOT Vmean:  55.400 cm/s LVOT VTI:    0.165 m  AORTA Ao Root diam: 3.90 cm MITRAL VALVE                TRICUSPID VALVE MV Area (PHT): 5.66 cm     TR Peak grad:   56.6 mmHg MV Decel Time: 134 msec     TR Vmax:        376.00 cm/s MV E velocity: 105.00 cm/s MV A velocity: 62.10 cm/s   SHUNTS MV E/A ratio:  1.69         Systemic VTI:  0.16 m                             Systemic Diam: 2.20 cm Cherlynn Kaiser MD Electronically signed by Cherlynn Kaiser MD Signature Date/Time: 11/29/2021/11:51:57 AM    Final  CT HEAD WO CONTRAST  Result Date: 11/28/2021 CLINICAL DATA:  Follow-up subdural hematoma EXAM: CT HEAD WITHOUT CONTRAST TECHNIQUE: Contiguous axial images were obtained from the base of the skull through the vertex without intravenous contrast. RADIATION DOSE REDUCTION: This exam was performed according to the departmental dose-optimization program which includes automated exposure control, adjustment of the mA and/or kV according to patient size and/or use of iterative reconstruction technique. COMPARISON:  11/28/2021, 10:17 a.m. FINDINGS: Brain: Unchanged, minimal left parafalcine subdural hemorrhage (series 4, image 22). No evidence of acute infarction, new hemorrhage, hydrocephalus, new extra-axial collection or mass lesion/mass effect. Vascular: No hyperdense vessel or unexpected calcification. Skull: Normal. Negative for fracture or focal lesion. Sinuses/Orbits: No acute finding. Other: Large soft tissue contusion of the left scalp vertex (series 4, image 15). IMPRESSION: 1. Unchanged, minimal left parafalcine subdural hemorrhage. No new or enlarging hemorrhage. 2. Large soft tissue contusion of the left scalp vertex. Electronically Signed   By: Delanna Ahmadi M.D.   On: 11/28/2021 16:15    Scheduled Meds:   Chlorhexidine Gluconate Cloth  6 each Topical Daily   furosemide  20 mg Intravenous Daily   guaiFENesin  600 mg Oral BID   insulin aspart  0-15 Units Subcutaneous TID WC   insulin aspart  6 Units Subcutaneous TID WC   ipratropium-albuterol  3 mL Nebulization TID   linagliptin  5 mg Oral Daily   lisinopril  10 mg Oral Daily   nitroGLYCERIN  0.2 mg Transdermal Daily   senna-docusate  1 tablet Oral QHS   Continuous Infusions:  ceFEPime (MAXIPIME) IV Stopped (11/30/21 0237)    LOS: 2 days   Time spent: 35 mins  Lorian Yaun Wynetta Emery, MD How to contact the [facility] Attending or Consulting provider [place] or covering provider during after hours [place], for this patient?  Check the care team in [facility] and look for a) attending/consulting [facility] provider listed and b) the [facility] team listed Log into [email or web address] and use [place]'s universal password to access. If you do not have the password, please contact the hospital operator. Locate the [facility] provider you are looking for under [facility] and page to a number that you can be directly reached. If you still have difficulty reaching the provider, please page the [facility] (Director on Call) for the Hospitalists listed on amion for assistance.  11/30/2021, 12:00 PM

## 2021-11-30 NOTE — Progress Notes (Signed)
Chief Complaint  Patient presents with   Foot Swelling    Patient noted to have +2 pitting edema in the left foot. Family reports it started a few days ago. Patient denies pain .    Cellulitis    Patient currently on antibiotics for right foot infection reports taking as directed.     HPI: Patient is 81 y.o. male who presents today for follow-up infection right foot-second toe.  Presents today for follow-up.  Right foot doing about the same but concern for left foot swelling today.  Still on oral antibiotics.  No fevers or chills.   No Known Allergies  Physical Exam  Objective: AAO x3, NAD Pulses decreased right foot.  CRT is still delayed to the second toe but it is present.   On the right foot there is still edema present in the foot and faint erythema but this appears to be improved compared to what it was last appointment.  There is no fluctuance or crepitation there is no drainage or pus.  There is some scabbing, early necrosis present on the second digit. On the left foot there is edema present but there is no erythema or warmth.  No areas of fluctuation or crepitation. No pain with calf compression, erythema or warmth.      Assessment: Ulceration, resolving cellulitis right foot, left foot swelling, PAD  Plan: Difficult situation to be amputation finish the course of antibiotics.  Continue with daily dressing changes and monitor the right foot.  Concern of the left foot swelling.  Discussed with him elevation still but did discussed different exercises he can do to help facilitate circulation.  Monitor closely for any signs or symptoms of worsening infection report to emergency room should any occur.  Otherwise I will see him next week.  X-ray bilateral feet next appointment  Vivi Barrack DPM

## 2021-11-30 NOTE — Progress Notes (Signed)
Consultation Note Date: 11/30/2021   Patient Name: Wayne [name]  DOB: 1940-09-13  MRN: [id number]  Age / Sex: 81 y.o., male  PCP: Patient, No Pcp Per Referring Physician: Cleora Fleet, MD  Reason for Consultation:  goals of care  HPI/Patient Profile: 81 y.o. male  with past medical history of DM2, CAD, HTN, PVD, recent admission for lower extremity cellulitis admitted on 11/28/2021 after a fall at home. He has had 3 falls in the last six months. Workup this admission reveals multifocal pneumonia. He has additional new finding of heart failure with ECHO showing EF of 35% with decreased function of the L ventricle indicating grade II diastolic dysfunction. Palliative medicine consulted for GOC.     Primary Decision Maker PATIENT with help from his daughter, Wayne [name] and ex-spouse Wayne [name]  Discussion: I have reviewed medical records including Care Everywhere, progress notes from this and prior admissions, labs and imaging.  On evaluation patient is awake and alert. Oriented, able to participate in GOC discussion. His daughter Wayne [name], and ex-wife Wayne [name] were also present and assisted with history and decision making.   I introduced Palliative Medicine as specialized medical care for people living with serious illness. It focuses on providing relief from the symptoms and stress of a serious illness. The goal is to improve quality of life for both the patient and the family.  [name] was living at home.  He has a dog named a little bit that he had gets great joy from.  He also gets great joy from his ex-wife Wayne [name] and his daughter Wayne [name].  Spending time with them is most important to him.  As far as functional and nutritional status -after does feel there has been some change since this time last year.  Wayne [name] shares that she has noticed a great difference in his functional ability.  He spends most of his day  from bed to chair.  He snacks more than eats meals.  He is independent with all of his ADLs.  We discussed patient's current illness and what it means in the larger context of patient's on-going co-morbidities.  Natural disease trajectory and expectations at EOL were discussed.   I attempted to elicit values and goals of care important to the patient.  [name] notes that he has not ever really considered what is important to him regarding quality of life.  Advance directives, concepts specific to code status, artificial feeding and hydration, and rehospitalization were considered and discussed.  [name] shares that he has completed advanced directives and has a DO NOT RESUSCITATE form at home.  His daughter and ex-wife were not aware of this.   Discussed with patient/family the importance of continued conversation with family and the medical providers regarding overall plan of care and treatment options, ensuring decisions are within the context of the patient's values and GOCs.    They note for now the goals of care are to meet with the cardiologist and to continue to treat what is treatable.  [name] is open  to going to a nursing facility for some rehab.  His ultimate goal would be to return home, but he and his family are considering what barriers there may be to this.  He has several stairs at home that may cause him difficulty.  They are considering him possibly moving in with [name] or possibly building an area for him to stay in the back of [place]'s home.  Palliative Care services outpatient were explained and offered.  Patient and family would like continued follow-up from palliative when he is discharged.   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  Respiratory:  Negative for shortness of breath.     Physical Exam Vitals and nursing note reviewed.  Cardiovascular:     Rate and Rhythm: Normal rate.     Pulses: Normal pulses.  Pulmonary:     Effort: Pulmonary effort is normal.  Musculoskeletal:        General: Swelling present.     Comments: Bilateral lower extremities with edema  Neurological:     Mental Status: He is alert and oriented to person, place, and time.  Psychiatric:        Mood and Affect: Mood normal.        Behavior: Behavior normal.        Thought Content: Thought content normal.        Judgment: Judgment normal.     Vital Signs: BP 135/86   Pulse 90   Temp 97.7 F (36.5 C) (Oral)   Resp 20   Ht 6' (1.829 m)   Wt 90.8 kg   SpO2 98%   BMI 27.15 kg/m  Pain Scale: 0-10 POSS *See Group Information*: 1-Acceptable,Awake and alert Pain Score: 0-No pain   SpO2: SpO2: 98 % O2 Device:SpO2: 98 % O2 Flow Rate: .O2 Flow Rate (L/min): 2 L/min  IO: Intake/output summary:  Intake/Output Summary (Last 24 hours) at 11/30/2021 1507 Last data filed at 11/30/2021 1300 Gross per 24 hour  Intake 528.94 ml  Output 1440 ml  Net -911.06 ml    LBM: Last BM Date :  (PTA) Baseline Weight: Weight: 83.9 kg Most recent weight: Weight: 90.8 kg       Thank you for this consult. Palliative medicine will continue to follow and assist as needed.   Greater than 50%  of this time was spent counseling and coordinating care  related to the above assessment and plan.  Signed by: Wayne [name], AGNP-C Palliative Medicine    Please contact Palliative Medicine Team phone at [phone] for questions and concerns.  For individual provider: See Wayne [name]

## 2021-12-01 ENCOUNTER — Inpatient Hospital Stay (HOSPITAL_COMMUNITY): Payer: Medicare Other

## 2021-12-01 ENCOUNTER — Ambulatory Visit: Payer: Medicare Other | Admitting: Podiatry

## 2021-12-01 ENCOUNTER — Encounter (HOSPITAL_COMMUNITY): Payer: Self-pay | Admitting: Family Medicine

## 2021-12-01 DIAGNOSIS — J9601 Acute respiratory failure with hypoxia: Secondary | ICD-10-CM | POA: Diagnosis not present

## 2021-12-01 DIAGNOSIS — W19XXXA Unspecified fall, initial encounter: Secondary | ICD-10-CM | POA: Diagnosis not present

## 2021-12-01 DIAGNOSIS — I998 Other disorder of circulatory system: Secondary | ICD-10-CM | POA: Diagnosis present

## 2021-12-01 DIAGNOSIS — I272 Pulmonary hypertension, unspecified: Secondary | ICD-10-CM

## 2021-12-01 DIAGNOSIS — I2510 Atherosclerotic heart disease of native coronary artery without angina pectoris: Secondary | ICD-10-CM | POA: Diagnosis not present

## 2021-12-01 DIAGNOSIS — E538 Deficiency of other specified B group vitamins: Secondary | ICD-10-CM | POA: Diagnosis not present

## 2021-12-01 DIAGNOSIS — R54 Age-related physical debility: Secondary | ICD-10-CM

## 2021-12-01 DIAGNOSIS — G9340 Encephalopathy, unspecified: Secondary | ICD-10-CM

## 2021-12-01 DIAGNOSIS — R627 Adult failure to thrive: Secondary | ICD-10-CM | POA: Diagnosis not present

## 2021-12-01 DIAGNOSIS — Y92009 Unspecified place in unspecified non-institutional (private) residence as the place of occurrence of the external cause: Secondary | ICD-10-CM

## 2021-12-01 DIAGNOSIS — L03115 Cellulitis of right lower limb: Secondary | ICD-10-CM | POA: Diagnosis not present

## 2021-12-01 DIAGNOSIS — D519 Vitamin B12 deficiency anemia, unspecified: Secondary | ICD-10-CM | POA: Diagnosis not present

## 2021-12-01 LAB — BASIC METABOLIC PANEL
Anion gap: 4 — ABNORMAL LOW (ref 5–15)
BUN: 31 mg/dL — ABNORMAL HIGH (ref 8–23)
CO2: 23 mmol/L (ref 22–32)
Calcium: 8.4 mg/dL — ABNORMAL LOW (ref 8.9–10.3)
Chloride: 111 mmol/L (ref 98–111)
Creatinine, Ser: 1.2 mg/dL (ref 0.61–1.24)
GFR, Estimated: >60 mL/min (ref >60)
Glucose, Bld: 156 mg/dL — ABNORMAL HIGH (ref 70–99)
Potassium: 4.4 mmol/L (ref 3.5–5.1)
Sodium: 138 mmol/L (ref 135–145)

## 2021-12-01 LAB — GLUCOSE, CAPILLARY
Glucose-Capillary: 147 mg/dL — ABNORMAL HIGH (ref 70–99)
Glucose-Capillary: 144 mg/dL — ABNORMAL HIGH (ref 70–99)
Glucose-Capillary: 146 mg/dL — ABNORMAL HIGH (ref 70–99)
Glucose-Capillary: 238 mg/dL — ABNORMAL HIGH (ref 70–99)
Glucose-Capillary: 221 mg/dL — ABNORMAL HIGH (ref 70–99)
Glucose-Capillary: 197 mg/dL — ABNORMAL HIGH (ref 70–99)

## 2021-12-01 LAB — LIPID PANEL
Cholesterol: 105 mg/dL (ref 0–200)
HDL: 27 mg/dL — ABNORMAL LOW (ref >40)
LDL Cholesterol: 61 mg/dL (ref 0–99)
Total CHOL/HDL Ratio: 3.9 RATIO
Triglycerides: 85 mg/dL (ref <150)
VLDL: 17 mg/dL (ref 0–40)

## 2021-12-01 LAB — URINE CULTURE: Culture: NO GROWTH

## 2021-12-01 LAB — BRAIN NATRIURETIC PEPTIDE: B Natriuretic Peptide: 1283 pg/mL — ABNORMAL HIGH (ref 0.0–100.0)

## 2021-12-01 LAB — MAGNESIUM: Magnesium: 2.3 mg/dL (ref 1.7–2.4)

## 2021-12-01 MED ORDER — LOSARTAN POTASSIUM 50 MG PO TABS
50.0000 mg | ORAL_TABLET | Freq: Every day | ORAL | Status: DC
  Administered 2021-12-02: 50 mg via ORAL
  Filled 2021-12-01: qty 1

## 2021-12-01 MED ORDER — DOXYCYCLINE HYCLATE 100 MG PO TABS
100.0000 mg | ORAL_TABLET | Freq: Two times a day (BID) | ORAL | Status: DC
  Administered 2021-12-01 – 2021-12-04 (×6): 100 mg via ORAL
  Filled 2021-12-01 (×7): qty 1

## 2021-12-01 MED ORDER — FUROSEMIDE 10 MG/ML IJ SOLN
40.0000 mg | Freq: Every day | INTRAMUSCULAR | Status: AC
  Administered 2021-12-02 – 2021-12-03 (×2): 40 mg via INTRAVENOUS
  Filled 2021-12-01 (×2): qty 4

## 2021-12-01 MED ORDER — FUROSEMIDE 10 MG/ML IJ SOLN
20.0000 mg | Freq: Once | INTRAMUSCULAR | Status: AC
  Administered 2021-12-01: 20 mg via INTRAVENOUS
  Filled 2021-12-01: qty 2

## 2021-12-01 MED ORDER — ACETAMINOPHEN 500 MG PO TABS
1000.0000 mg | ORAL_TABLET | Freq: Three times a day (TID) | ORAL | Status: DC
  Administered 2021-12-01 – 2021-12-02 (×3): 1000 mg via ORAL
  Filled 2021-12-01 (×3): qty 2

## 2021-12-01 NOTE — Consult Note (Addendum)
Cardiology Consultation   Patient ID: Wayne [name] MRN: [id number]; DOB: May 31, 1940  Admit date: 11/28/2021 Date of Consult: 12/01/2021  PCP:  Patient, No Pcp Per   [facility] [place] Providers Cardiologist:  New to Dr. Gasper Sells (remotely followed by Dr. Doreatha Lew who retired a decade ago) Click here to update MD or APP on Care Team, Refresh:1}     Patient Profile:   Wayne [name] is a 81 y.o. male with a hx of PAD, DM, HTN, peripheral neuropathy, former tobacco abuse, CAD (details unclear, remote stent by Dr. Doreatha Lew >20 years ago), recent foot infection with complex admission who is being seen 12/01/2021 for the evaluation of cardiomyopathy at the request of Dr. Wynetta Emery.  History of Present Illness:   Wayne [name] has some memory difficulty but does recall having a stent put in his heart by Dr. Doreatha Lew over 20 years ago. He has not been following with cardiology for many years. 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 He had PV angio 11/13/21 with diffusely diseased popliteal but widely patent, occluded R tibial artery, unable to cross the lesion antegrade - recommended to follow clinically and if he had deterioration, consider retrograde posterior tibial access. He was discharged to home and lives alone but has hired help and family to assist. He returned to the hospital 11/28/2021 with fall. He was found down on the floor and unable to get up. His girlfriend found him per notes so EMS was called. He was confused, hypoxic 85% RA, and complained of shortness of breath. He was unable to recall details of fall but had scalp bruising so CT head was done showing left parafalcine subdural  hemorrhage. Neurosurgery has recommended conservative management per notes. Other issues include multifocal PNA, lactic acidosis, failure to thrive prompting palliative consultation and is now DNR with otherwise full scope of care. As part of his workup had echo showing EF 35% (The mid and distal anterior wall, mid and distal lateral wall, mid and distal anterior septum, mid anterolateral segment, mid inferoseptal segment, and apical inferior segment are hypokinetic), G2DD, severely elevated PASP, dilated IVC, swirling apical contrast is seen indicative of slow flow but no LV thrombus. There is no echo on file to compare to.  Today he reports some discomfort in his back but otherwise no CP, SOB, or edema. He is 92% RA with some occasional desaturations. Current cardiac rx includes IV Lasix 81m daily (Started yesterday), lisinopril 132mdaily (home med), NTG patch (home med -> started by podiatry 11/17/21). CXR today shows worsening CHF pattern with increased central vascular prominence and moderate pulmonary edema, Perihilar opacities most likely from alveolar edema, underlying pneumonia not excluded, persistent left lower lobe consolidation with increasing bilateral moderate pleural effusions.   Past Medical History:  Diagnosis Date   Anemia of chronic disease    Chronic kidney disease, stage 3a ([facility]   Coronary artery disease    Diabetes mellitus with circulatory complication ([facility]   Diabetic foot infection ([facility]   Encephalopathy    Former tobacco use    PAD (peripheral artery disease) ([facility]   Peripheral neuropathy     Past Surgical History:  Procedure Laterality Date   ABDOMINAL AORTOGRAM W/LOWER EXTREMITY Right 11/13/2021   Procedure: ABDOMINAL AORTOGRAM W/LOWER EXTREMITY;  Surgeon: Cherre Robins, MD;  Location: [facility];  Service: Cardiovascular;  Laterality: Right;   CORONARY ANGIOPLASTY WITH STENT PLACEMENT       Home Medications:  Prior to Admission medications    Medication Sig Start Date End Date Taking? Authorizing Provider  aspirin EC 81 MG tablet Take 1 tablet (81 mg total) by mouth daily. Swallow whole. 11/16/21  Yes Patrecia Pour, MD  atorvastatin (LIPITOR) 40 MG tablet Take 1 tablet (40 mg total) by mouth daily. 11/16/21  Yes Patrecia Pour, MD  cephALEXin (KEFLEX) 500 MG capsule Take 1 capsule (500 mg total) by mouth 2 (two) times daily. 11/20/21  Yes Bronson Ing, DPM  glipiZIDE (GLUCOTROL) 5 MG tablet Take 0.5 tablets (2.5 mg total) by mouth daily before breakfast. 11/16/21  Yes Patrecia Pour, MD  ibuprofen (ADVIL) 200 MG tablet Take 200 mg by mouth every 6 (six) hours as needed for mild pain.   Yes [provider]  linagliptin (TRADJENTA) 5 MG TABS tablet Take 1 tablet (5 mg total) by mouth daily. 11/16/21  Yes Patrecia Pour, MD  lisinopril (ZESTRIL) 10 MG tablet Take 1 tablet (10 mg total) by mouth daily. 11/16/21  Yes Patrecia Pour, MD  nitroGLYCERIN (NITRO-DUR) 0.2 mg/hr patch Place 1 patch (0.2 mg total) onto the skin daily. 11/17/21  Yes Trula Slade, DPM  Accu-Chek Softclix Lancets lancets Use 4 times daily as directed to check blood sugars. 11/16/21   Patrecia Pour, MD  blood glucose meter kit and supplies KIT Dispense based on patient and insurance preference. Use up to four times daily as directed. 11/15/21   Patrecia Pour, MD  blood glucose meter kit and supplies KIT Use up to four times daily as directed. 11/16/21   Patrecia Pour, MD  glucose blood test strip Use as instructed 06/13/12   Noemi Chapel, MD  glucose blood test strip Use 4 times daily as directed to check bloos sugars. 11/16/21   Patrecia Pour, MD    Inpatient Medications: Scheduled Meds:  Chlorhexidine Gluconate Cloth  6 each Topical Daily   furosemide  20 mg Intravenous Daily   guaiFENesin  600 mg Oral BID   insulin aspart  0-9 Units Subcutaneous TID WC   insulin aspart  3 Units Subcutaneous TID WC   ipratropium-albuterol  3 mL Nebulization TID    linagliptin  5 mg Oral Daily   lisinopril  10 mg Oral Daily   nitroGLYCERIN  0.2 mg Transdermal Daily   senna-docusate  1 tablet Oral QHS   Continuous Infusions:  ceFEPime (MAXIPIME) IV 2 g (12/01/21 0153)   PRN Meds: acetaminophen **OR** acetaminophen, bisacodyl, dextromethorphan, ipratropium-albuterol, ondansetron **OR** ondansetron (ZOFRAN) IV, oxyCODONE, polyethylene glycol, traZODone  Allergies:   No Known Allergies  Social History:   Social History   Socioeconomic History   Marital status: Single    Spouse name: Not on file   Number of children: Not on file   Years of education: Not on file   Highest education level: Not on file  Occupational History   Not on file  Tobacco Use   Smoking status: Former    Types: Cigarettes   Smokeless tobacco: Not on file  Substance and Sexual Activity   Alcohol use: No   Drug use: No   Sexual activity: Not on file  Other Topics Concern   Not on file  Social History Narrative   Not on file   Social Determinants  of Health   Financial Resource Strain: Not on file  Food Insecurity: Not on file  Transportation Needs: Not on file  Physical Activity: Not on file  Stress: Not on file  Social Connections: Not on file  Intimate Partner Violence: Not on file    Family History:    Family History  Problem Relation Age of Onset   Diabetes type II Neg Hx      ROS:  Not reliable given he is not fully A+O  Physical Exam/Data:   Vitals:   12/01/21 0600 12/01/21 0716 12/01/21 0800 12/01/21 0834  BP: (!) 151/66  (!) 155/69   Pulse: 79 84 86   Resp: 17 (!) 23 20   Temp:  98.2 F (36.8 C)    TempSrc:  Oral    SpO2: 96% 96% 92% 92%  Weight:      Height:        Intake/Output Summary (Last 24 hours) at 12/01/2021 0853 Last data filed at 12/01/2021 0500 Gross per 24 hour  Intake --  Output 2225 ml  Net -2225 ml      12/01/2021    5:00 AM 11/30/2021    4:17 AM 11/28/2021    6:20 PM  Last 3 Weights  Weight (lbs) 190 lb 7.6 oz 200  lb 2.8 oz 197 lb 15.6 oz  Weight (kg) 86.4 kg 90.8 kg 89.8 kg     Body mass index is 25.83 kg/m.  General: Well developed, well nourished WM in no acute distress.  Head: Normocephalic, atraumatic, sclera non-icteric, no xanthomas, nares are without discharge. Neck: Negative for carotid bruits. JVP not elevated. Lungs: Decreased BS at bases with right basilar crackles. Otherwise no wheezing or rhonchi. Breathing is unlabored. Heart: RRR S1 S2 without murmurs, rubs, or gallops.  Abdomen: Rounded, somewhat distended, with normoactive bowel sounds. No rebound/guarding. Extremities: No clubbing or cyanosis. No edema. Distal pedal pulses are 2+ and equal bilaterally. Neuro: Alert and oriented to self only. Moves all extremities spontaneously. Psych: Pleasant affect, friendly.    EKG:  The EKG was personally reviewed and demonstrates:  sinus tach 108bpm, nonspecific STTW changes - TWI V3-V6 new from prior. Telemetry:  Telemetry was personally reviewed and demonstrates:  NSR  Relevant CV Studies:  2D Echo 11/29/21   1. Left ventricular ejection fraction, by estimation, is 35%. The left  ventricle has moderately decreased function. The left ventricle  demonstrates regional wall motion abnormalities (see scoring  diagram/findings for description). Left ventricular  diastolic parameters are consistent with Grade II diastolic dysfunction  (pseudonormalization).   2. Right ventricular systolic function is normal. The right ventricular  size is normal. There is severely elevated pulmonary artery systolic  pressure. The estimated right ventricular systolic pressure is 15.9 mmHg.   3. Left atrial size was mildly dilated.   4. The mitral valve is grossly normal. Trivial mitral valve  regurgitation. No evidence of mitral stenosis.   5. The aortic valve is normal in structure. There is mild calcification  of the aortic valve. Aortic valve regurgitation is not visualized. No  aortic stenosis is  present.   6. The inferior vena cava is dilated in size with <50% respiratory  variability, suggesting right atrial pressure of 15 mmHg.   Conclusion(s)/Recommendation(s): No left ventricular mural or apical  thrombus/thrombi. Swirling apical contrast is seen indicative of slow  flow.   PV Angio 11/13/21 DATE OF SERVICE: 11/13/2021   PATIENT:  Wayne [name]  81 y.o. male   PRE-OPERATIVE DIAGNOSIS:  Atherosclerosis  of native arteries of right lower extremity causing diabetic foot infection   POST-OPERATIVE DIAGNOSIS:  Same   PROCEDURE:   1) Ultrasound guided left common femoral artery access 2) Aortogram 3) Right lower extremity angiogram with third order cannulation (173m total contrast)   SURGEON:  TYevonne Aline HStanford Breed MD   ASSISTANT: none   ANESTHESIA:   local   ESTIMATED BLOOD LOSS: minimal   LOCAL MEDICATIONS USED:  LIDOCAINE    COUNTS: confirmed correct.   PATIENT DISPOSITION:  PACU - hemodynamically stable.   Delay start of Pharmacological VTE agent (>24hrs) due to surgical blood loss or risk of bleeding: no   INDICATION FOR PROCEDURE: ATalvin Christiansonis a 81y.o. male with right foot ulcer with peripheral arterial disease. After careful discussion of risks, benefits, and alternatives the patient was offered angiography. The patient understood and wished to proceed.   OPERATIVE FINDINGS:  Terminal aorta and iliac arteries: Widely patent without flow-limiting stenosis   Right lower extremity: Common femoral artery: Widely patent without flow-limiting stenosis  Profunda femoris artery: Widely patent without flow-limiting stenosis  Superficial femoral artery: Diffusely diseased.  No stenosis greater than 50%. Popliteal artery: Diffusely diseased.  Widely patent. Anterior tibial artery: Occluded Tibioperoneal trunk: Widely patent Peroneal artery: Widely patent to the ankle.  Arborization to the PT, filling the foot. Posterior tibial artery: Patent at its  origin.  Patent about the ankle.  Fills the pedal circulation. Pedal circulation: Disadvantaged.  Fills only through PT.  Small vessel disease noted.   GLASS score.  Not applicable.  Inline flow to the ankle.   Wifi score 1 / 1 / 2.  Clinical stage III.   DESCRIPTION OF PROCEDURE: After identification of the patient in the pre-operative holding area, the patient was transferred to the operating room. The patient was positioned supine on the operating room table. The groins were prepped and draped in standard fashion. A surgical pause was performed confirming correct patient, procedure, and operative location.   The left groin was anesthetized with subcutaneous injection of 1% lidocaine. Using ultrasound guidance, the left common femoral artery was accessed with micropuncture technique. Fluoroscopy was used to confirm cannulation over the femoral head. The 175F sheath was upsized to 75F.    A Benson wire was advanced into the distal aorta. Over the wire an omni flush catheter was advanced to the level of L2. Aortogram was performed - see above for details.   The right common iliac artery was selected with an omniflush catheter and Bentson guidewire. The wire was advanced into the common femoral artery. Over the wire the omni flush catheter was advanced into the external iliac artery. Selective angiography was performed - see above for details.    The decision was made to intervene. The patient was heparinized with 8000 units of heparin. The 75F sheath was exchanged for a 60F x 45cm sheath. Selective angiography of the left lower extremity was performed prior to intervention.    I tried to recanalize the posterior tibial artery with a variety of wire and catheter exchanges.  I was unable to cross the lesion antegrade.  I elected to stop here.  All endovascular equipment was then removed.   A minx device was used to close the arteriotomy. Hemostasis was excellent upon completion.   Upon completion of  the case instrument and sharps counts were confirmed correct. The patient was transferred to the PACU in good condition. I was present for all portions of the procedure.   PLAN: ASA  74m PO QD. High intensity statin therapy.  Inline flow to the ankle.  Patient optimized from a vascular standpoint.  Would recommend local care to the toe only.  Would not recommend amputation at this time.  We will follow closely clinically.  If he has deterioration, we could consider retrograde posterior tibial access.   TYevonne Aline HStanford Breed MD [facility] CenterPhone Number: [phone]/18/2023 9:53 AM      Laboratory Data:  High Sensitivity Troponin:  No results for input(s): "TROPONINIHS" in the last 720 hours.   Chemistry Recent Labs  Lab 11/29/21 0328 11/30/21 0401 12/01/21 0433  NA 138 136 138  K 4.1 4.2 4.4  CL 111 111 111  CO2 18* 19* 23  GLUCOSE 263* 90 156*  BUN 27* 32* 31*  CREATININE 1.17 1.21 1.20  CALCIUM 8.1* 7.9* 8.4*  MG 2.1  --  2.3  GFRNONAA >60 60* >60  ANIONGAP 9 6 4*    Recent Labs  Lab 11/28/21 0925  PROT 7.0  ALBUMIN 3.3*  AST 26  ALT 23  ALKPHOS 248*  BILITOT 1.1   Lipids  Recent Labs  Lab 12/01/21 0433  CHOL 105  TRIG 85  HDL 27*  LDLCALC 61  CHOLHDL 3.9    Hematology Recent Labs  Lab 11/28/21 0925 11/29/21 0328  WBC 10.8* 9.9  RBC 3.85* 3.50*  HGB 11.4* 10.2*  HCT 36.2* 32.5*  MCV 94.0 92.9  MCH 29.6 29.1  MCHC 31.5 31.4  RDW 13.4 13.4  PLT 197 169   Thyroid No results for input(s): "TSH", "FREET4" in the last 168 hours.  BNP Recent Labs  Lab 11/28/21 0925 11/29/21 0328 12/01/21 0433  BNP 609.0* 2,052.0* 1,283.0*    DDimer No results for input(s): "DDIMER" in the last 168 hours.   Radiology/Studies:  DG CHEST PORT 1 VIEW  Result Date: 12/01/2021 CLINICAL DATA:  [id number] Admitted with CHF and multifocal pneumonia. Recent history of cellulitis lower extremity and metabolic encephalopathy. EXAM:  PORTABLE CHEST 1 VIEW COMPARISON:  Portable chest 11/28/2021 FINDINGS: 4:51 a.m. Mild cardiomegaly. There is increased perihilar vascular congestion and moderate interstitial pulmonary edema with a basal gradient There are increased perihilar opacities radiating into the lower lung fields most likely indicating alveolar edema, with underlying pneumonia not excluded. Left lower lobe consolidation continues to be seen in the retrocardiac space and there are moderate increased pleural effusions. The upper 1/3 of the lungs remain clear of focal opacities but there is increased apical interstitial edema. There is aortic atherosclerosis with stable mediastinum. Degenerative change thoracic spine. IMPRESSION: 1. Worsening CHF pattern with increased central vascular prominence and moderate pulmonary edema. 2. Perihilar opacities most likely from alveolar edema, underlying pneumonia not excluded. 3. Persistent left lower lobe consolidation with increasing bilateral moderate pleural effusions. 4. Aortic atherosclerosis. Electronically Signed   By: KTelford NabM.D.   On: 12/01/2021 06:54   ECHOCARDIOGRAM COMPLETE  Result Date: 11/29/2021    ECHOCARDIOGRAM REPORT   Patient Name:   Wayne BUTTDate of Exam: 11/29/2021 Medical Rec #:  [id number]        Height:       72.0 in Accession #:    [id number]       Weight:       198.0 lb Date of Birth:  [birth year]       BSA:          2.121 m Patient Age:    89years  BP:           136/61 mmHg Patient Gender: M                 HR:           81 bpm. Exam Location:  [facility] Procedure: 2D Echo, Cardiac Doppler and Color Doppler Indications:    CAD Native Vessel I25.10, Congestive Heart Failure I50.9  History:        Patient has no prior history of Echocardiogram examinations.                 CAD; Risk Factors:Dyslipidemia, Diabetes and Hypertension. Hx                 S/P percutaneous coronary angioplasty.  Sonographer:    Alvino Chapel RCS Referring Phys: [place]  1. Left ventricular ejection fraction, by estimation, is 35%. The left ventricle has moderately decreased function. The left ventricle demonstrates regional wall motion abnormalities (see scoring diagram/findings for description). Left ventricular diastolic parameters are consistent with Grade II diastolic dysfunction (pseudonormalization).  2. Right ventricular systolic function is normal. The right ventricular size is normal. There is severely elevated pulmonary artery systolic pressure. The estimated right ventricular systolic pressure is 09.6 mmHg.  3. Left atrial size was mildly dilated.  4. The mitral valve is grossly normal. Trivial mitral valve regurgitation. No evidence of mitral stenosis.  5. The aortic valve is normal in structure. There is mild calcification of the aortic valve. Aortic valve regurgitation is not visualized. No aortic stenosis is present.  6. The inferior vena cava is dilated in size with <50% respiratory variability, suggesting right atrial pressure of 15 mmHg. Conclusion(s)/Recommendation(s): No left ventricular mural or apical thrombus/thrombi. Swirling apical contrast is seen indicative of slow flow. FINDINGS  Left Ventricle: Left ventricular ejection fraction, by estimation, is 35%. The left ventricle has moderately decreased function. The left ventricle demonstrates regional wall motion abnormalities. Definity contrast agent was given IV to delineate the left ventricular endocardial borders. The left ventricular internal cavity size was normal in size. There is no left ventricular hypertrophy. Left ventricular diastolic parameters are consistent with Grade II diastolic dysfunction (pseudonormalization).  LV Wall Scoring: The mid and distal anterior wall, mid and distal lateral wall, mid and distal anterior septum, mid anterolateral segment, mid inferoseptal segment, and apical inferior segment are hypokinetic. Right Ventricle: The right ventricular  size is normal. No increase in right ventricular wall thickness. Right ventricular systolic function is normal. There is severely elevated pulmonary artery systolic pressure. The tricuspid regurgitant velocity is 3.76 m/s, and with an assumed right atrial pressure of 15 mmHg, the estimated right ventricular systolic pressure is 04.5 mmHg. Left Atrium: Left atrial size was mildly dilated. Right Atrium: Right atrial size was normal in size. Pericardium: Trivial pericardial effusion is present. Mitral Valve: The mitral valve is grossly normal. Trivial mitral valve regurgitation. No evidence of mitral valve stenosis. Tricuspid Valve: The tricuspid valve is normal in structure. Tricuspid valve regurgitation is mild . No evidence of tricuspid stenosis. Aortic Valve: The aortic valve is normal in structure. There is mild calcification of the aortic valve. Aortic valve regurgitation is not visualized. No aortic stenosis is present. Pulmonic Valve: The pulmonic valve was normal in structure. Pulmonic valve regurgitation is trivial. No evidence of pulmonic stenosis. Aorta: The aortic root is normal in size and structure. Ascending aorta measurements are within normal limits for age when indexed to body surface area. Venous:  The inferior vena cava is dilated in size with less than 50% respiratory variability, suggesting right atrial pressure of 15 mmHg. IAS/Shunts: No atrial level shunt detected by color flow Doppler.  LEFT VENTRICLE PLAX 2D LVIDd:         4.80 cm   Diastology LVIDs:         3.70 cm   LV e' medial:    6.31 cm/s LV PW:         1.10 cm   LV E/e' medial:  16.6 LV IVS:        1.20 cm   LV e' lateral:   6.96 cm/s LVOT diam:     2.20 cm   LV E/e' lateral: 15.1 LV SV:         63 LV SV Index:   30 LVOT Area:     3.80 cm  RIGHT VENTRICLE RV S prime:     14.80 cm/s TAPSE (M-mode): 2.5 cm LEFT ATRIUM             Index        RIGHT ATRIUM           Index LA diam:        4.40 cm 2.07 cm/m   RA Area:     17.20 cm LA Vol  (A2C):   68.6 ml 32.34 ml/m  RA Volume:   48.90 ml  23.05 ml/m LA Vol (A4C):   82.8 ml 39.03 ml/m LA Biplane Vol: 76.4 ml 36.01 ml/m  AORTIC VALVE LVOT Vmax:   70.50 cm/s LVOT Vmean:  55.400 cm/s LVOT VTI:    0.165 m  AORTA Ao Root diam: 3.90 cm MITRAL VALVE                TRICUSPID VALVE MV Area (PHT): 5.66 cm     TR Peak grad:   56.6 mmHg MV Decel Time: 134 msec     TR Vmax:        376.00 cm/s MV E velocity: 105.00 cm/s MV A velocity: 62.10 cm/s   SHUNTS MV E/A ratio:  1.69         Systemic VTI:  0.16 m                             Systemic Diam: 2.20 cm Cherlynn Kaiser MD Electronically signed by Cherlynn Kaiser MD Signature Date/Time: 11/29/2021/11:51:57 AM    Final    CT HEAD WO CONTRAST  Result Date: 11/28/2021 CLINICAL DATA:  Follow-up subdural hematoma EXAM: CT HEAD WITHOUT CONTRAST TECHNIQUE: Contiguous axial images were obtained from the base of the skull through the vertex without intravenous contrast. RADIATION DOSE REDUCTION: This exam was performed according to the departmental dose-optimization program which includes automated exposure control, adjustment of the mA and/or kV according to patient size and/or use of iterative reconstruction technique. COMPARISON:  11/28/2021, 10:17 a.m. FINDINGS: Brain: Unchanged, minimal left parafalcine subdural hemorrhage (series 4, image 22). No evidence of acute infarction, new hemorrhage, hydrocephalus, new extra-axial collection or mass lesion/mass effect. Vascular: No hyperdense vessel or unexpected calcification. Skull: Normal. Negative for fracture or focal lesion. Sinuses/Orbits: No acute finding. Other: Large soft tissue contusion of the left scalp vertex (series 4, image 15). IMPRESSION: 1. Unchanged, minimal left parafalcine subdural hemorrhage. No new or enlarging hemorrhage. 2. Large soft tissue contusion of the left scalp vertex. Electronically Signed   By: Delanna Ahmadi M.D.   On: 11/28/2021 16:15  CT Head Wo Contrast  Addendum Date: 11/28/2021    ADDENDUM REPORT: 11/28/2021 10:42 ADDENDUM: Study discussed by telephone with PA TAMMY TRIPLETT on 11/28/2021 at 1037 hours. Electronically Signed   By: Genevie Ann M.D.   On: 11/28/2021 10:42   Result Date: 11/28/2021 CLINICAL DATA:  81 year old male found down. EXAM: CT HEAD WITHOUT CONTRAST TECHNIQUE: Contiguous axial images were obtained from the base of the skull through the vertex without intravenous contrast. RADIATION DOSE REDUCTION: This exam was performed according to the departmental dose-optimization program which includes automated exposure control, adjustment of the mA and/or kV according to patient size and/or use of iterative reconstruction technique. COMPARISON:  Head CT 11/12/2021. FINDINGS: Brain: Trace anterior para falcine subdural blood suspected on series 3, image 18. No mass effect. No other acute intracranial hemorrhage identified. No midline shift, ventriculomegaly, mass effect, evidence of mass lesion, or evidence of cortically based acute infarction. No IVH. Stable gray-white matter differentiation throughout the brain. Vascular: Calcified atherosclerosis at the skull base. No suspicious intracranial vascular hyperdensity. Skull: Stable.  No acute osseous abnormality identified. Sinuses/Orbits: Visualized paranasal sinuses and mastoids are stable and well aerated. Other: Broad-based new left posterior convexity scalp hematoma. Underlying calvarium appears stable and intact. Stable orbits soft tissues. Calcified scalp vessel atherosclerosis. IMPRESSION: 1. Trace anterior para-falcine subdural blood no mass effect. No other acute intracranial abnormality. 2. Acute left posterior convexity scalp hematoma without underlying skull fracture. Electronically Signed: By: Genevie Ann M.D. On: 11/28/2021 10:34   CT Hip Right Wo Contrast  Result Date: 11/28/2021 CLINICAL DATA:  Patient BIB by [facility] after girlfriend to home this am to find patient on floor. Patient reports that he fell walking up steps  around 0730 however, EMS believes that patient may have fallen overnight. Patient is confused on arrival. Found on second story of home after he crawled to couch. Complaining of right hip/lower back pain from fall. EXAM: CT OF THE RIGHT HIP WITHOUT CONTRAST TECHNIQUE: Multidetector CT imaging of the right hip was performed according to the standard protocol. Multiplanar CT image reconstructions were also generated. RADIATION DOSE REDUCTION: This exam was performed according to the departmental dose-optimization program which includes automated exposure control, adjustment of the mA and/or kV according to patient size and/or use of iterative reconstruction technique. COMPARISON:  None Available. FINDINGS: Bones/Joint/Cartilage No fracture.  No bone lesion. Superolateral right hip joint space narrowing. Small marginal spurs from the base of the right femoral head and bony prominence from the superior right acetabulum, findings consistent with osteoarthritis. No convincing joint effusion. Ligaments Suboptimally assessed by CT. Muscles and Tendons No evidence of a muscle hematoma/injury. Tendons are grossly intact. Soft tissues No acute findings. IMPRESSION: 1. No fracture or acute finding. 2. Mild right hip joint osteoarthritis. Electronically Signed   By: Lajean Manes M.D.   On: 11/28/2021 10:39   DG Chest Port 1 View  Result Date: 11/28/2021 CLINICAL DATA:  Patient found on the floor this morning. Patient reports that he fell walking up steps this morning. EXAM: PORTABLE CHEST 1 VIEW COMPARISON:  11/11/2021. FINDINGS: Cardiac silhouette is normal in size. No mediastinal or hilar masses. Thickened bilateral interstitial markings. Mild hazy airspace opacity projects in the mid to lower lungs. Small pleural effusions suspected. No pneumothorax. Skeletal structures are grossly intact. IMPRESSION: 1. Interstitial thickening with hazy lung opacities and probable small effusions. Suspect pulmonary edema, with multifocal  pneumonia in the differential diagnosis. Electronically Signed   By: Lajean Manes M.D.   On: 11/28/2021  10:35     Assessment and Plan:   1. Acute hypoxic respiratory failure with multiple medical issues - suspected multifocal PNA also driven by component of acute HFrEF - exam c/w bilateral pleural effusions and crackles - additional issues include lactic acidosis, subdural hemorrhage with fall, failure to thrive - ongoing IM/palliative follow-up  2. Acute HFrEF - EF 35% + WMA without prior to compare to -> CXR c/w worsening CHF and increasing pleural effusions - likely needs more diuresis and advancement of GDMT - will discuss med rx with MD, has BP to support this - current cardiac rx includes IV Lasix 38m daily (Started yesterday), lisinopril 120mdaily (home med), NTG patch (home med -> this was started by podiatry 11/17/21 so suspect was a more local med not systemic)  3. 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 Driven by hypoxia with PNA, CHF, also former tobacco abuse so cannot exclude some component of underlying pulm disease - given comorbidities and acute illness would hold off RHC for now but will review plan with MD  4. Acute encephalopathy - patient politely says he is unable to remember where he is or what the date is; remote notes in chart indicate some degree of underlying dementia - will need clear disposition at discharge  5. Remote CAD s/p stenting - denies recent angina though cannot exclude progression of obstructive coronary disease given decline in LVEF - given subdural hemorrhage, not a candidate for invasive evaluation at present time (ASA stopped), follow conservatively with symptom-based approach for now - resume statin when OK with primary team, discontinued earlier this admission  6. 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 PAD - OP f/u VVS  Risk Assessment/Risk Scores:        New York Heart Association (NYHA) Functional  Class NYHA Class II-III        For questions or updates, please contact [place] consult [email or web address] for contact info under    Signed, DaCharlie PitterPA-C  12/01/2021 8:53 AM  Personally seen and examined. Agree with APP above with the following comments:  Briefly [age]o M with a history of HTN with DM, PAD, CAD with prior PCI with new cardiomyopathy complicated by CKD NOS, cognitive impairment, FTT   with a subdural hemorrhage planned for conservative therapy.  Patient notes that he is doing well.  He has no symptoms. No CP, SOB, Palpitations, syncope.  Has not seen a cardiologist in years.  [place] He is very pleasant and alter to person and place (hospital)  Does not remember the fall;. His PAD work up or his admission.  When I show him is LE edema, he is unclear of why it has occurred.  Exam notable for Bilateral Franks Sign, RRR, bibasilar crackles on my exam, no murmur.  +2 edema R leg worse than right.  Otherwise as above.  EKG Sinus tachy with anterior TWI Tele: SR with rare PVCs  Would recommend  - concerned SOB and crackles are mixed with a HF component; he has no LV thrombus but has presumed new LAD disease - given his subdural and his mentation; planned for conservative therapy - will stop lisinopril, and start lostartn with plans of entresto start 12/03/21. - given 40 IV lasix today for lasix naive patient; may add aldactone tomorrow based on K response - resumed home statin when able  MaRudean HaskellMD [facility] [place]  [address], [address] [place], [place] [address] [phone]  10:32 AM

## 2021-12-01 NOTE — Assessment & Plan Note (Addendum)
--   Pt was evaluated by Dr. Heath Lark with vascular surgery on 11/13/21.  He was taken to OR to optimize vascular flow.  Pt has disadvantaged pedal circulation. He has an occluded anterior tibial artery.  -- Dr. [name] reported that patient had been optimized from a vascular standpoint.  He recommended local care to toe only.  He did not recommend amputation.  If patient has deterioration, he would consider retrograde posterior tibial access.  Dr. [name] recommended aspirin 81 mg daily and high intensity statin.  Unfortunately daughter says she did not give statin to patient because she does not feel it is safe for him and she does NOT want a statin given to him in hospital and so I had to discontinue the drug.  --overall toe stable with small area of dry eschar on dorsum --restart ASA when neurosurgery clears patient

## 2021-12-01 NOTE — Progress Notes (Signed)
Daily Progress Note   Patient Name: Wayne [name]       Date: 12/01/2021 DOB: 12-06-1940  Age: 81 y.o. MRN#: [id number] Attending Physician: Cleora Fleet, MD Primary Care Physician: Patient, No Pcp Per Admit Date: 11/28/2021  Reason for Consultation/Follow-up: Establishing goals of care  Patient Profile/HPI:  81 y.o. male  with past medical history of DM2, CAD, HTN, PVD, recent admission for lower extremity cellulitis admitted on 11/28/2021 after a fall at home. He has had 3 falls in the last six months. Workup this admission reveals multifocal pneumonia. He has additional new finding of heart failure with ECHO showing EF of 35% with decreased function of the L ventricle indicating grade II diastolic dysfunction. Palliative medicine consulted for GOC.      Subjective: Chart reviewed. Patient is sitting up in bed- awake and alert. No family at bedside. He is not feeling well. Reporting new onset of bilateral hip pain. Onset was just after transferring from ICU to floor and just after receiving IV lasix. Spoke with his daughter Wayne [name] for followup- he has been complaining of this pain since his fall.  He had CT of hip with findings for R hip osteoarthritis.  She is in communication with social work today and working on placement. She was appreciative of palliative discussion yesterday. She has located his advanced care planning documents.    Physical Exam Vitals and nursing note reviewed.  Pulmonary:     Effort: Pulmonary effort is normal.  Musculoskeletal:        General: No deformity.     Comments: BLE edema  Neurological:     Mental Status: He is alert.             Vital Signs: BP (!) 154/83 (BP Location: Left Arm)   Pulse 90   Temp 98.5 F (36.9 C)   Resp 18   Ht 6' (1.829  m)   Wt 86.4 kg   SpO2 90%   BMI 25.83 kg/m  SpO2: SpO2: 90 % O2 Device: O2 Device: Room Air O2 Flow Rate: O2 Flow Rate (L/min): 2 L/min  Intake/output summary:  Intake/Output Summary (Last 24 hours) at 12/01/2021 1532 Last data filed at 12/01/2021 1300 Gross per 24 hour  Intake 180 ml  Output 1675 ml  Net -1495 ml   LBM: Last BM  Date : 11/30/21 Baseline Weight: Weight: 83.9 kg Most recent weight: Weight: 86.4 kg       Palliative Assessment/Data: PPS: 50%      Patient Active Problem List   Diagnosis Date Noted  . Ischemia of toe 12/01/2021  . Cardiomyopathy Severe Systolic Heart Failure 11/30/2021  . Constipation 11/30/2021  . Acute respiratory failure with hypoxia (HCC) 11/28/2021  . Multifocal pneumonia 11/28/2021  . Dyslipidemia 11/28/2021  . Fall at home 11/28/2021  . Subdural hematoma (HCC) 11/28/2021  . Failure to thrive in adult 11/28/2021  . Lactic acidosis 11/28/2021  . B12 deficiency 11/28/2021  . B12 deficiency anemia 11/28/2021  . PAD (peripheral artery disease) (HCC) 11/28/2021  . Diabetic neuropathy (HCC) 11/28/2021  . Cellulitis of right foot 11/11/2021  . 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 CAD S/P percutaneous coronary angioplasty 11/11/2021  . Cellulitis 11/11/2021    Palliative Care Assessment & Plan    Assessment/Recommendations/Plan  Continue current plan of care Acetaminophen 1000mg  po tid for bilateral hip and leg pain likely related to fall- may also be complicated by peripheral vascular disease Plan for d/c to SNF with outpatient Palliative followup   Code Status: DNR  Prognosis:  Unable to determine  Discharge Planning: Skilled Nursing Facility for rehab with Palliative care service follow-up  Care plan was discussed with patient's daughter, .  Thank you for allowing the Palliative Medicine Team to assist in the care of this patient.      Greater than 50%  of this time was  spent counseling and coordinating care related to the above assessment and plan.  Wayne [name], AGNP-C Palliative Medicine   Please contact Palliative Medicine Team phone at [phone] for questions and concerns.

## 2021-12-01 NOTE — TOC Progression Note (Addendum)
Transition of Care [facility]) - Progression Note    Patient Details  Name: Bethany Cumming MRN: [id number] Date of Birth: 1941-01-13  Transition of [facility]) CM/SW Contact  Annice Needy, LCSW Phone Number: 12/01/2021, 4:37 PM  Clinical Narrative:    Bed offers given to daughter, [name]. She chooses bed at [facility] Auth started. Referred to [facility] palliative at request of Palliative NP request. Daughter confirms she wants to utilize [facility] Palliative.    Expected Discharge Plan: Skilled Nursing Facility Barriers to Discharge: Continued Medical Work up  Expected Discharge Plan and Services Expected Discharge Plan: Skilled Nursing Facility In-house Referral: Clinical Social Work   Post Acute Care Choice: Skilled Nursing Facility Living arrangements for the past 2 months: Single Family Home                                       Social Determinants of Health (SDOH) Interventions    Readmission Risk Interventions    11/12/2021    2:59 PM  Readmission Risk Prevention Plan  Post Dischage Appt Complete  Medication Screening Complete  Transportation Screening Complete

## 2021-12-01 NOTE — Progress Notes (Signed)
Spoke with patient's daughter, [name], she is agreeable to Palliative Services. Chooses [facility] Palliative services. Referral made.    Shala Baumbach, Juleen China, LCSW

## 2021-12-01 NOTE — Progress Notes (Signed)
PROGRESS NOTE   Wayne [name]  [id number] DOB: [birth year] DOA: 11/28/2021 PCP: Patient, No Pcp Per   Chief Complaint  Patient presents with   Fall   Level of care: Telemetry  Brief Admission History:  81 year old gentleman with type 2 diabetes mellitus, coronary artery disease status post stenting, hypertension, peripheral vascular disease, peripheral neuropathy, recent right second toe phalanx fracture, cellulitis right foot and leg, former smoker recently discharged from [facility] after being hospitalized for right foot cellulitis, metabolic encephalopathy.  He reports that he had been doing fairly well at home.  He lives alone but has hired help and close family members.  He reports that he was trying to walk up the stairs he believes yesterday when he apparently tripped and fell.  He was down on the floor for an unknown period of time and was not able to get up.  Apparently his girlfriend found him on the floor and called EMS.  Patient was noted to be confused and complained of shortness of breath.  He was hypoxic and his blood sugar was elevated.  His pulse ox was 85% on room air.  He had a small abrasion to the right elbow.  Patient was unsure if he hit his head however he was noted to have a bruise on the scalp.  His CT scan revealed minimal left parafalcine subdural hemorrhage.  Neurosurgery was consulted and they recommended repeat CT in 6 hours but patient can remain at [facility].  He also had a CT of the right hip that did not show acute fracture.  He was noted to have a multifocal pneumonia on chest x-ray.  He has a new oxygen requirement and he was started on steroids and antibiotics.  Admission was requested for further management.   Assessment and Plan: * Acute respiratory failure with hypoxia (HCC) - Secondary to multifocal pneumonia, continue IV antibiotics -Status post IV Solu-Medrol x1 dose given in ED -Continue supplemental oxygen, wean as needed -Continue  bronchodilators as ordered - pneumonia is getting better clinically, de-escalate antibiotics  Cardiomyopathy Severe Systolic Heart Failure -- TTE concerning with findings of severely diminished systolic heart function EF: LVEF 35% with grade 2 DD, regional wall motion abnormalities -- will request cardiology consultation  -- IV lasix 40 mg daily - dose increased by cardiology team today   Subdural hematoma ([place]) - Neurosurgery was consulted and recommended repeat CT scan in 6 hours which has been completed and hemorrhage remains minimal and stable with no enlargement -- we have stopped aspirin for now and not using any anticoagulants   Ischemia of toe -- Pt was evaluated by Dr. Jamelle Haring with vascular surgery on 11/13/21.  He was taken to OR to optimize vascular flow.  Pt has disadvantaged pedal circulation. He has an occluded anterior tibial artery.  -- Dr. Stanford Breed reported that patient had been optimized from a vascular standpoint.  He recommended local care to toe only.  He did not recommend amputation.  If patient has deterioration, he would consider retrograde posterior tibial access.  Dr. Stanford Breed recommended aspirin 81 mg daily and high intensity statin.  Unfortunately daughter says she did not give statin to patient because she does not feel it is safe for him and she does NOT want a statin given to him in hospital and so I had to discontinue the drug.   Constipation -- added dulcolax suppository prn -- added nightly peri-colace  -- RN reported large BM 9/5  Diabetic neuropathy ([place]) - Resume  home gabapentin  PAD (peripheral artery disease) (HCC) - He was evaluated by vascular surgery at [facility] during recent hospitalization and they did everything they could to optimize vascular flow to his right foot.   - Dr. [name] recommended aspirin 81 mg daily and high intensity statin treatment.  He did not recommend amputation of toe but local care and close monitoring.   B12  deficiency anemia - Hemoglobin stable from recent testing  B12 deficiency - Follow-up B12 level: 515 -Pt received supplementation during the recent hospitalization at [facility]  Lactic acidosis - likely secondary to hypoxia, hydrated with IV fluid boluses in the ED  Failure to thrive in adult - Precipitous decline in recent weeks, requesting palliative medicine consultation for further goals of care discussions  Fall at home - Remains high fall risk, PT evaluation requested, fall precautions ordered  Dyslipidemia - Discussed with daughter she decided that she did not want her father taking a statin medication so she has not been giving him atorvastatin and requested that it not be given to him in the hospital  Multifocal pneumonia - Broad-spectrum antibiotic coverage initially but as he has clinically improved we are de-escalating antibiotics  CAD S/P percutaneous coronary angioplasty - Resumed home cardiac medications  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) - Continue SSI coverage and frequent CBG monitoring, hold home glipizide  CBG (last 3)  Recent Labs    11/30/21 2200 12/01/21 0310 12/01/21 0714  GLUCAP 147* 144* 146*     Cellulitis of right foot - He has been on antibiotics for this and he is followed closely by his podiatrist for this.  We are temporarily holding his oral antibiotics while he is on IV antibiotics for pneumonia.  Continue local wound care. -- the cellulitis seems to have resolved now   DVT prophylaxis: SCDs  Code Status: Full  Family Communication: daughter, ex-wife updated 9/2, 9/3, 9/4 Disposition: Status is: Inpatient Remains inpatient appropriate because: IV antibiotics   Consultants:  Cardiology Palliative medicine  Procedures:  TTE  Antimicrobials:  Cefepime 9/2>>9/5 Doxycycline 9/5>   Subjective: Pt c/o having constipation.   Objective: Vitals:   12/01/21 0800 12/01/21 0834 12/01/21 0900 12/01/21  0912  BP: (!) 155/69  (!) 140/56 (!) 140/56  Pulse: 86  89   Resp: 20     Temp:      TempSrc:      SpO2: 92% 92% 92%   Weight:      Height:        Intake/Output Summary (Last 24 hours) at 12/01/2021 1048 Last data filed at 12/01/2021 0500 Gross per 24 hour  Intake --  Output 1675 ml  Net -1675 ml   Filed Weights   11/28/21 1820 11/30/21 0417 12/01/21 0500  Weight: 89.8 kg 90.8 kg 86.4 kg   Examination:  General exam: Appears calm and comfortable  Respiratory system: Clear to auscultation. Respiratory effort normal. Cardiovascular system: normal S1 & S2 heard. No JVD, murmurs, rubs, gallops or clicks. 2+ bilateral LE edema. Gastrointestinal system: Abdomen is nondistended, soft and nontender. No organomegaly or masses felt. Normal bowel sounds heard. Central nervous system: Alert and oriented. No focal neurological deficits. Extremities: right foot toe lesion     Skin: No rashes, lesions or ulcers. Psychiatry: Judgement and insight appear normal. Mood & affect appropriate.   Data Reviewed: I have personally reviewed following labs and imaging studies  CBC: Recent Labs  Lab 11/28/21 0925 11/29/21 0328  WBC 10.8* 9.9  NEUTROABS 8.7* 8.6*  HGB 11.4* 10.2*  HCT 36.2* 32.5*  MCV 94.0 92.9  PLT 197 [id number]    Basic Metabolic Panel: Recent Labs  Lab 11/28/21 0925 11/29/21 0328 11/30/21 0401 12/01/21 0433  NA 138 138 136 138  K 4.3 4.1 4.2 4.4  CL 108 111 111 111  CO2 22 18* 19* 23  GLUCOSE 223* 263* 90 156*  BUN 22 27* 32* 31*  CREATININE 1.13 1.17 1.21 1.20  CALCIUM 8.2* 8.1* 7.9* 8.4*  MG  --  2.1  --  2.3    CBG: Recent Labs  Lab 11/30/21 1611 11/30/21 1639 11/30/21 2200 12/01/21 0310 12/01/21 0714  GLUCAP 69* 104* 147* 144* 146*    Recent Results (from the past 240 hour(s))  Blood culture (routine x 2)     Status: None (Preliminary result)   Collection Time: 11/28/21  9:08 AM   Specimen: Right Antecubital; Blood  Result Value Ref Range Status    Specimen Description   Final    RIGHT ANTECUBITAL BOTTLES DRAWN AEROBIC AND ANAEROBIC   Special Requests   Final    Blood Culture results may not be optimal due to an excessive volume of blood received in culture bottles   Culture   Final    NO GROWTH 3 DAYS Performed at [facility] [place], [address], [place], [place] [address]    Report Status PENDING  Incomplete  Blood culture (routine x 2)     Status: None (Preliminary result)   Collection Time: 11/28/21  9:20 AM   Specimen: Left Antecubital; Blood  Result Value Ref Range Status   Specimen Description   Final    LEFT ANTECUBITAL BOTTLES DRAWN AEROBIC AND ANAEROBIC   Special Requests Blood Culture adequate volume  Final   Culture   Final    NO GROWTH 3 DAYS Performed at [facility], [address], [place], [place] [address]    Report Status PENDING  Incomplete  Resp Panel by RT-PCR (Flu A&B, Covid) Anterior Nasal Swab     Status: None   Collection Time: 11/28/21 10:37 AM   Specimen: Anterior Nasal Swab  Result Value Ref Range Status   SARS Coronavirus 2 by RT PCR NEGATIVE NEGATIVE Final    Comment: (NOTE) SARS-CoV-2 target nucleic acids are NOT DETECTED.  The SARS-CoV-2 RNA is generally detectable in upper respiratory specimens during the acute phase of infection. The lowest concentration of SARS-CoV-2 viral copies this assay can detect is 138 copies/mL. A negative result does not preclude SARS-Cov-2 infection and should not be used as the sole basis for treatment or other patient management decisions. A negative result may occur with  improper specimen collection/handling, submission of specimen other than nasopharyngeal swab, presence of viral mutation(s) within the areas targeted by this assay, and inadequate number of viral copies(<138 copies/mL). A negative result must be combined with clinical observations, patient history, and epidemiological information. The expected result is Negative.  Fact Sheet for Patients:   EntrepreneurPulse.com.au  Fact Sheet for Healthcare Providers:  IncredibleEmployment.be  This test is no t yet approved or cleared by the [place] FDA and  has been authorized for detection and/or diagnosis of SARS-CoV-2 by FDA under an Emergency Use Authorization (EUA). This EUA will remain  in effect (meaning this test can be used) for the duration of the COVID-19 declaration under Section 564(b)(1) of the Act, 21 U.S.C.section 360bbb-3(b)(1), unless the authorization is terminated  or revoked sooner.       Influenza A by  PCR NEGATIVE NEGATIVE Final   Influenza B by PCR NEGATIVE NEGATIVE Final    Comment: (NOTE) The Xpert Xpress SARS-CoV-2/FLU/RSV plus assay is intended as an aid in the diagnosis of influenza from Nasopharyngeal swab specimens and should not be used as a sole basis for treatment. Nasal washings and aspirates are unacceptable for Xpert Xpress SARS-CoV-2/FLU/RSV testing.  Fact Sheet for Patients: BloggerCourse.com  Fact Sheet for Healthcare Providers: SeriousBroker.it  This test is not yet approved or cleared by the [place] FDA and has been authorized for detection and/or diagnosis of SARS-CoV-2 by FDA under an Emergency Use Authorization (EUA). This EUA will remain in effect (meaning this test can be used) for the duration of the COVID-19 declaration under Section 564(b)(1) of the Act, 21 U.S.C. section 360bbb-3(b)(1), unless the authorization is terminated or revoked.  Performed at [facility], [address], [place], [place] [address]   MRSA Next Gen by PCR, Nasal     Status: None   Collection Time: 11/28/21  1:01 PM   Specimen: Nasal Mucosa; Nasal Swab  Result Value Ref Range Status   MRSA by PCR Next Gen NOT DETECTED NOT DETECTED Final    Comment: (NOTE) The GeneXpert MRSA Assay (FDA approved for NASAL specimens only), is one component of a  comprehensive MRSA colonization surveillance program. It is not intended to diagnose MRSA infection nor to guide or monitor treatment for MRSA infections. Test performance is not FDA approved in patients less than 40 years old. Performed at [facility], [address] [place], [place] [address]      Radiology Studies: DG CHEST PORT 1 VIEW  Result Date: 12/01/2021 CLINICAL DATA:  [id number]. Admitted with CHF and multifocal pneumonia. Recent history of cellulitis lower extremity and metabolic encephalopathy. EXAM: PORTABLE CHEST 1 VIEW COMPARISON:  Portable chest 11/28/2021 FINDINGS: 4:51 a.m. Mild cardiomegaly. There is increased perihilar vascular congestion and moderate interstitial pulmonary edema with a basal gradient There are increased perihilar opacities radiating into the lower lung fields most likely indicating alveolar edema, with underlying pneumonia not excluded. Left lower lobe consolidation continues to be seen in the retrocardiac space and there are moderate increased pleural effusions. The upper 1/3 of the lungs remain clear of focal opacities but there is increased apical interstitial edema. There is aortic atherosclerosis with stable mediastinum. Degenerative change thoracic spine. IMPRESSION: 1. Worsening CHF pattern with increased central vascular prominence and moderate pulmonary edema. 2. Perihilar opacities most likely from alveolar edema, underlying pneumonia not excluded. 3. Persistent left lower lobe consolidation with increasing bilateral moderate pleural effusions. 4. Aortic atherosclerosis. Electronically Signed   By: Almira Bar M.D.   On: 12/01/2021 06:54    Scheduled Meds:  Chlorhexidine Gluconate Cloth  6 each Topical Daily   doxycycline  100 mg Oral Q12H   furosemide  20 mg Intravenous Once   Followed by   Melene Muller ON 12/02/2021] furosemide  40 mg Intravenous Daily   guaiFENesin  600 mg Oral BID   insulin aspart  0-9 Units Subcutaneous TID WC   insulin aspart  3 Units  Subcutaneous TID WC   ipratropium-albuterol  3 mL Nebulization TID   linagliptin  5 mg Oral Daily   [START ON 12/02/2021] losartan  50 mg Oral Daily   nitroGLYCERIN  0.2 mg Transdermal Daily   senna-docusate  1 tablet Oral QHS   Continuous Infusions:   LOS: 3 days   Time spent: 35 mins  Yaviel Kloster Laural Benes, MD How to contact the [facility] Attending or Consulting  provider [place] or covering provider during after hours [place], for this patient?  Check the care team in [facility] and look for a) attending/consulting TRH provider listed and b) the [facility] team listed Log into [email or web address] and use [place]'s universal password to access. If you do not have the password, please contact the hospital operator. Locate the [facility] provider you are looking for under [facility] and page to a number that you can be directly reached. If you still have difficulty reaching the provider, please page the [facility] (Director on Call) for the Hospitalists listed on amion for assistance.  12/01/2021, 10:48 AM

## 2021-12-02 ENCOUNTER — Ambulatory Visit: Payer: Medicare Other | Admitting: Family Medicine

## 2021-12-02 ENCOUNTER — Inpatient Hospital Stay (HOSPITAL_COMMUNITY): Payer: Medicare Other

## 2021-12-02 DIAGNOSIS — E538 Deficiency of other specified B group vitamins: Secondary | ICD-10-CM | POA: Diagnosis not present

## 2021-12-02 DIAGNOSIS — J9601 Acute respiratory failure with hypoxia: Secondary | ICD-10-CM | POA: Diagnosis not present

## 2021-12-02 DIAGNOSIS — I5021 Acute systolic (congestive) heart failure: Secondary | ICD-10-CM

## 2021-12-02 DIAGNOSIS — E1151 Type 2 diabetes mellitus with diabetic peripheral angiopathy without gangrene: Secondary | ICD-10-CM

## 2021-12-02 DIAGNOSIS — N1831 Chronic kidney disease, stage 3a: Secondary | ICD-10-CM

## 2021-12-02 DIAGNOSIS — I5032 Chronic diastolic (congestive) heart failure: Secondary | ICD-10-CM

## 2021-12-02 DIAGNOSIS — S065XAA Traumatic subdural hemorrhage with loss of consciousness status unknown, initial encounter: Secondary | ICD-10-CM

## 2021-12-02 LAB — BASIC METABOLIC PANEL
Anion gap: 5 (ref 5–15)
BUN: 29 mg/dL — ABNORMAL HIGH (ref 8–23)
CO2: 24 mmol/L (ref 22–32)
Calcium: 8.2 mg/dL — ABNORMAL LOW (ref 8.9–10.3)
Chloride: 108 mmol/L (ref 98–111)
Creatinine, Ser: 1.2 mg/dL (ref 0.61–1.24)
GFR, Estimated: >60 mL/min (ref >60)
Glucose, Bld: 138 mg/dL — ABNORMAL HIGH (ref 70–99)
Potassium: 3.8 mmol/L (ref 3.5–5.1)
Sodium: 137 mmol/L (ref 135–145)

## 2021-12-02 LAB — CBC
HCT: 30.6 % — ABNORMAL LOW (ref 39.0–52.0)
Hemoglobin: 9.8 g/dL — ABNORMAL LOW (ref 13.0–17.0)
MCH: 29.7 pg (ref 26.0–34.0)
MCHC: 32 g/dL (ref 30.0–36.0)
MCV: 92.7 fL (ref 80.0–100.0)
Platelets: 138 10*3/uL — ABNORMAL LOW (ref 150–400)
RBC: 3.3 MIL/uL — ABNORMAL LOW (ref 4.22–5.81)
RDW: 13.6 % (ref 11.5–15.5)
WBC: 6.1 10*3/uL (ref 4.0–10.5)
nRBC: 0 % (ref 0.0–0.2)

## 2021-12-02 LAB — GLUCOSE, CAPILLARY
Glucose-Capillary: 135 mg/dL — ABNORMAL HIGH (ref 70–99)
Glucose-Capillary: 138 mg/dL — ABNORMAL HIGH (ref 70–99)
Glucose-Capillary: 183 mg/dL — ABNORMAL HIGH (ref 70–99)
Glucose-Capillary: 127 mg/dL — ABNORMAL HIGH (ref 70–99)

## 2021-12-02 LAB — BRAIN NATRIURETIC PEPTIDE: B Natriuretic Peptide: 1331 pg/mL — ABNORMAL HIGH (ref 0.0–100.0)

## 2021-12-02 LAB — MAGNESIUM: Magnesium: 2.2 mg/dL (ref 1.7–2.4)

## 2021-12-02 MED ORDER — METOPROLOL SUCCINATE ER 25 MG PO TB24
12.5000 mg | ORAL_TABLET | Freq: Every day | ORAL | Status: DC
  Administered 2021-12-02 – 2021-12-04 (×3): 12.5 mg via ORAL
  Filled 2021-12-02 (×3): qty 1

## 2021-12-02 MED ORDER — ACETAMINOPHEN 500 MG PO TABS
1000.0000 mg | ORAL_TABLET | Freq: Four times a day (QID) | ORAL | Status: DC
  Administered 2021-12-02 – 2021-12-04 (×7): 1000 mg via ORAL
  Filled 2021-12-02 (×7): qty 2

## 2021-12-02 NOTE — Assessment & Plan Note (Addendum)
11/29/21 Echo--EF 35%, G2DD, PASP 71,6, trivial MR, mild TR neg 2.3 L on 9/5 and 3.8 L since admit. Reds vest data pending. Received IV lasix 20mg  x 2 on 9/5, due for 40mg  x 1 today. Renal function is stable --continue IV lasix>>po lasix on 9/8 --appreciate cardiology>>added metoprolol succinate, continue losartan --started Entresto 9/7 --started SLGT2  9/7 --spironolactone and metoprolol succinate started --outpt cardiology followup

## 2021-12-02 NOTE — Progress Notes (Addendum)
Progress Note  Patient Name: Wayne [name] Date of Encounter: 12/02/2021  Primary Cardiologist: Werner Lean, MD  Subjective   Remains confused but very pleasant. Denies CP, SOB. Good UOP by I/O's with Lasix. Does note some hip pain (also reported yesterday in notes as well).  Inpatient Medications    Scheduled Meds:  acetaminophen  1,000 mg Oral TID   Chlorhexidine Gluconate Cloth  6 each Topical Daily   doxycycline  100 mg Oral Q12H   furosemide  40 mg Intravenous Daily   guaiFENesin  600 mg Oral BID   insulin aspart  0-9 Units Subcutaneous TID WC   insulin aspart  3 Units Subcutaneous TID WC   linagliptin  5 mg Oral Daily   losartan  50 mg Oral Daily   nitroGLYCERIN  0.2 mg Transdermal Daily   senna-docusate  1 tablet Oral QHS   Continuous Infusions:  PRN Meds: bisacodyl, dextromethorphan, ipratropium-albuterol, ondansetron **OR** ondansetron (ZOFRAN) IV, oxyCODONE, polyethylene glycol, traZODone   Vital Signs    Vitals:   12/01/21 1422 12/01/21 1943 12/01/21 2002 12/02/21 0503  BP:   (!) 146/67 134/63  Pulse:   85 62  Resp:   18 18  Temp:   (!) 97.4 F (36.3 C) 98 F (36.7 C)  TempSrc:   Oral Oral  SpO2: 90% 91% 93% 96%  Weight:    84.3 kg  Height:        Intake/Output Summary (Last 24 hours) at 12/02/2021 0818 Last data filed at 12/02/2021 0656 Gross per 24 hour  Intake 540 ml  Output 2600 ml  Net -2060 ml      12/02/2021    5:03 AM 12/01/2021    5:00 AM 11/30/2021    4:17 AM  Last 3 Weights  Weight (lbs) 185 lb 13.6 oz 190 lb 7.6 oz 200 lb 2.8 oz  Weight (kg) 84.3 kg 86.4 kg 90.8 kg     Telemetry    Not on telemetry - Personally Reviewed  ECG    No new tracings - Personally Reviewed  Physical Exam   GEN: No acute distress.  HEENT: Normocephalic, atraumatic, sclera non-icteric. Neck: No JVD or bruits. Cardiac: RRR no murmurs, rubs, or gallops.  Respiratory: Diminished at bases bilaterally. Breathing is unlabored. GI: Soft,  nontender, non-distended, BS +x 4. MS: no deformity. Extremities: No clubbing or cyanosis. No edema. Distal pedal pulses are 2+ and equal bilaterally. Neuro:  A+O to self only. Follows commands. Psych: Pleasant affect  Labs    High Sensitivity Troponin:  No results for input(s): "TROPONINIHS" in the last 720 hours.    Cardiac EnzymesNo results for input(s): "TROPONINI" in the last 168 hours. No results for input(s): "TROPIPOC" in the last 168 hours.   Chemistry Recent Labs  Lab 11/28/21 0925 11/29/21 0328 11/30/21 0401 12/01/21 0433 12/02/21 0425  NA 138   < > 136 138 137  K 4.3   < > 4.2 4.4 3.8  CL 108   < > 111 111 108  CO2 22   < > 19* 23 24  GLUCOSE 223*   < > 90 156* 138*  BUN 22   < > 32* 31* 29*  CREATININE 1.13   < > 1.21 1.20 1.20  CALCIUM 8.2*   < > 7.9* 8.4* 8.2*  PROT 7.0  --   --   --   --   ALBUMIN 3.3*  --   --   --   --   AST 26  --   --   --   --  ALT 23  --   --   --   --   ALKPHOS 248*  --   --   --   --   BILITOT 1.1  --   --   --   --   GFRNONAA >60   < > 60* >60 >60  ANIONGAP 8   < > 6 4* 5   < > = values in this interval not displayed.     Hematology Recent Labs  Lab 11/28/21 0925 11/29/21 0328 12/02/21 0425  WBC 10.8* 9.9 6.1  RBC 3.85* 3.50* 3.30*  HGB 11.4* 10.2* 9.8*  HCT 36.2* 32.5* 30.6*  MCV 94.0 92.9 92.7  MCH 29.6 29.1 29.7  MCHC 31.5 31.4 32.0  RDW 13.4 13.4 13.6  PLT 197 169 138*    BNP Recent Labs  Lab 11/29/21 0328 12/01/21 0433 12/02/21 0425  BNP 2,052.0* 1,283.0* 1,331.0*     DDimer No results for input(s): "DDIMER" in the last 168 hours.   Radiology    DG CHEST PORT 1 VIEW  Result Date: 12/01/2021 CLINICAL DATA:  19622. Admitted with CHF and multifocal pneumonia. Recent history of cellulitis lower extremity and metabolic encephalopathy. EXAM: PORTABLE CHEST 1 VIEW COMPARISON:  Portable chest 11/28/2021 FINDINGS: 4:51 a.m. Mild cardiomegaly. There is increased perihilar vascular congestion and moderate  interstitial pulmonary edema with a basal gradient There are increased perihilar opacities radiating into the lower lung fields most likely indicating alveolar edema, with underlying pneumonia not excluded. Left lower lobe consolidation continues to be seen in the retrocardiac space and there are moderate increased pleural effusions. The upper 1/3 of the lungs remain clear of focal opacities but there is increased apical interstitial edema. There is aortic atherosclerosis with stable mediastinum. Degenerative change thoracic spine. IMPRESSION: 1. Worsening CHF pattern with increased central vascular prominence and moderate pulmonary edema. 2. Perihilar opacities most likely from alveolar edema, underlying pneumonia not excluded. 3. Persistent left lower lobe consolidation with increasing bilateral moderate pleural effusions. 4. Aortic atherosclerosis. Electronically Signed   By: Almira Bar M.D.   On: 12/01/2021 06:54    Cardiac Studies   2D Echo 11/29/21   1. Left ventricular ejection fraction, by estimation, is 35%. The left  ventricle has moderately decreased function. The left ventricle  demonstrates regional wall motion abnormalities (see scoring  diagram/findings for description). Left ventricular  diastolic parameters are consistent with Grade II diastolic dysfunction  (pseudonormalization).   2. Right ventricular systolic function is normal. The right ventricular  size is normal. There is severely elevated pulmonary artery systolic  pressure. The estimated right ventricular systolic pressure is 71.6 mmHg.   3. Left atrial size was mildly dilated.   4. The mitral valve is grossly normal. Trivial mitral valve  regurgitation. No evidence of mitral stenosis.   5. The aortic valve is normal in structure. There is mild calcification  of the aortic valve. Aortic valve regurgitation is not visualized. No  aortic stenosis is present.   6. The inferior vena cava is dilated in size with <50%  respiratory  variability, suggesting right atrial pressure of 15 mmHg.   Conclusion(s)/Recommendation(s): No left ventricular mural or apical  thrombus/thrombi. Swirling apical contrast is seen indicative of slow  flow.     Patient Profile     81 y.o. male with PAD, DM, HTN, peripheral neuropathy, former tobacco abuse, CAD (details unclear, remote stent by Dr. 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 Returned to the hospital 11/28/2021 after being found by girlfriend having fallen. Found to have confusion, hypoxia, subdural hemorrhage, and multifocal PNA.  As part of his workup had echo showing EF 35% (The mid and distal anterior wall, mid and distal lateral wall, mid and distal anterior septum, mid anterolateral segment, mid inferoseptal segment, and apical inferior segment are hypokinetic), G2DD, severely elevated PASP, dilated IVC, swirling apical contrast is seen indicative of slow flow but no LV thrombus.   Assessment & Plan    1. Acute hypoxic respiratory failure with multiple medical issues - was diagnosed with multifocal PNA but also driven by component of acute HFrEF - additional issues include lactic acidosis, subdural hemorrhage with fall, failure to thrive, anemia, thrombocytopenia - ongoing IM/palliative follow-up   2. Acute HFrEF - EF 35% + WMA without prior to compare to -> CXR 12/01/21 c/w worsening CHF and increasing pleural effusions - on IV Lasix 40mg  daily (Lasix naive, dose increased 9/5) with excellent UOP - lisinopril changed to losartan today, will review plans for Entresto with MD, possible addition of  BB? - otherwise current cardiac rx includes NTG patch (home med -> this was started by podiatry 11/17/21 so suspect was a more local med not systemic) - no longer on telemetry though ordered; care order written to resume   3. 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 Driven by hypoxia with PNA, CHF, also former tobacco abuse so cannot exclude some component of underlying pulm disease - given comorbidities and acute illness would hold off RHC for now - outpatient f/u   4. Acute encephalopathy - patient politely says he is unable to remember where he is or what the date is; remote notes in chart indicate some degree of underlying dementia - will need clear disposition at discharge since he lived by himself   5. Remote CAD s/p stenting - denies recent angina though would be concerned for progression of obstructive coronary disease given decline in LVEF - given subdural hemorrhage and mentation, not a candidate for invasive evaluation at present time (ASA stopped), follow conservatively with symptom-based approach for now - per IM notes, statin discontinued due to daughter request   6. Suspected CKD 3a - recent Cr 1.2-1.6 in 10/2021, stable at 1.20 today   7. PAD - OP f/u [facility]    For questions or updates, please contact [place] Please consult [email or web address] for contact info under Cardiology/STEMI.  Signed, Charlie Pitter, PA-C 12/02/2021, 8:18 AM    Attending note Patient seen and discussed with PA Dunn, I agree with her documentation.   1.Acute HFrEF - 11/2021 echo: LVE 35%< grade II dd, normal RV function, severe pulm HTN PASP 72 - CXR +HF, mod pulm edema - neg 2.3 L yesterday and 3.8 L since admit. Reds vest data pending. REceived IV lasix 20mg  x 2 yesterday, due for 40mg  x 1 today. Renal function is stable.  - medical therapy with losartan 50 (lisinopril dose 12/01/21). Likely change to entresto tomorrow. Add toprol 12.5mg  daily. MRA and SLGT2i possibly later in admission - advanced  age and comorbidities with mild subdural bleed, no plans for ischemic testing at this time.    2. CAD - history of remote stenting - no acute issues this admission  3. Pulmonary HTN - PASP severely elevated at 72 by echo in setting of volume overload, left sided HF. Suspect large component is group II pulm HTN. Consider repeat echo over several weeks when he is euvolemic to reassess pressures. Discussions about outpatient palliative care, I don't see strong benefit in a broad extensive pulmonary HTN workup at this time.   3. SDH - 11/28/21 CT trace subdural blood    4. Pneumonia - per primary team   5. Encephalopathy - per primary team  Dina Rich MD

## 2021-12-02 NOTE — Progress Notes (Addendum)
PROGRESS NOTE  Joanna Hall ACZ:[id number] DOB: 14-Sep-1940 DOA: 11/28/2021 PCP: Patient, No Pcp Per  Brief History:  81 year old gentleman with type 2 diabetes mellitus, coronary artery disease status post stenting, hypertension, peripheral vascular disease, peripheral neuropathy, recent right second toe phalanx fracture, cellulitis right foot and leg, former smoker recently discharged from [facility] after being hospitalized for right foot cellulitis, metabolic encephalopathy.  He reports that he had been doing fairly well at home.  He lives alone but has hired help and close family members.  He reports that he was trying to walk up the stairs he believes yesterday when he apparently tripped and fell.  He was down on the floor for an unknown period of time and was not able to get up.  Apparently his girlfriend found him on the floor and called EMS.  Patient was noted to be confused and complained of shortness of breath.  He was hypoxic and his blood sugar was elevated.  His pulse ox was 85% on room air.  He had a small abrasion to the right elbow.  Patient was unsure if he hit his head however he was noted to have a bruise on the scalp.  His CT scan revealed minimal left parafalcine subdural hemorrhage.  Neurosurgery was consulted and they recommended repeat CT in 6 hours but patient can remain at [facility].  He also had a CT of the right hip that did not show acute fracture.  He was noted to have a multifocal pneumonia on chest x-ray.  He has a new oxygen requirement and he was started on steroids and antibiotics.  Admission was requested for further management.     Assessment and Plan: * Acute respiratory failure with hypoxia (HCC) - Secondary to CHF>> multifocal pneumonia, continue IV antibiotics -Status post IV Solu-Medrol x1 dose given in ED -Continue supplemental oxygen, wean as needed -Continue bronchodilators as ordered - pneumonia is getting better clinically,  de-escalate antibiotics - now stable on RA  Acute HFrEF (heart failure with reduced ejection fraction) (HCC) 11/29/21 Echo--EF 35%, G2DD, PASP 71,6, trivial MR, mild TR neg 2.3 L on 9/5 and 3.8 L since admit. Reds vest data pending. Received IV lasix 20mg  x 2 on 9/5, due for 40mg  x 1 today. Renal function is stable --continue IV lasix --appreciate cardiology>>added metoprolol succinate, continue losartan --plan start Entresto 9/7 --plan SLGT2 when able  Subdural hematoma [facility]) - Neurosurgery was consulted and recommended repeat CT scan in 6 hours which has been completed and hemorrhage remains minimal and stable with no enlargement -- we have stopped aspirin for now and not using any anticoagulants   Multifocal pneumonia - Broad-spectrum antibiotic coverage initially but as he has clinically improved we are de-escalating antibiotics --cefepime 9/2>>9/5 --doxy 9/5>> --cefinir 9/6>> --PCT 0.25>>0.19  Chronic kidney disease, stage 3a (HCC) Baseline creatinine 1.1-1.3 Monitor with diuresis  Ischemia of toe -- Pt was evaluated by Dr. 11/7 with vascular surgery on 11/13/21.  He was taken to OR to optimize vascular flow.  Pt has disadvantaged pedal circulation. He has an occluded anterior tibial artery.  -- Dr. Heath Lark reported that patient had been optimized from a vascular standpoint.  He recommended local care to toe only.  He did not recommend amputation.  If patient has deterioration, he would consider retrograde posterior tibial access.  Dr. 11/15/21 recommended aspirin 81 mg daily and high intensity statin.  Unfortunately daughter says she did not give statin  to patient because she does not feel it is safe for him and she does NOT want a statin given to him in hospital and so I had to discontinue the drug.   Constipation -- added dulcolax suppository prn -- added nightly peri-colace  -- RN reported large BM 9/5  Diabetic neuropathy ([place]) - Resume home gabapentin  PAD  (peripheral artery disease) ([place]) - He was evaluated by vascular surgery at [facility] during recent hospitalization and they did everything they could to optimize vascular flow to his right foot.   - Dr. Stanford Breed recommended aspirin 81 mg daily and high intensity statin treatment.  He did not recommend amputation of toe but local care and close monitoring.   B12 deficiency anemia - Hemoglobin stable from recent testing  B12 deficiency - Follow-up B12 level: 515 -Pt received supplementation during the recent hospitalization at [facility]  Lactic acidosis - likely secondary to hypoxia, hydrated with IV fluid boluses in the ED  Failure to thrive in adult - Precipitous decline in recent weeks, requesting palliative medicine consultation for further goals of care discussions  Fall at home - Remains high fall risk, PT evaluation requested, fall precautions ordered -PT recommends HHPT -CT Lumbar spine - no red flags on examination  Dyslipidemia - Discussed with daughter she decided that she did not want her father taking a statin medication so she has not been giving him atorvastatin and requested that it not be given to him in the hospital  CAD S/P percutaneous coronary angioplasty - Resumed home cardiac medications  Type 2 diabetes mellitus with peripheral vascular disease ([place]) - Continue SSI coverage and frequent CBG monitoring, hold home glipizide -11/12/21 A1C-9.4 -continue ISS  Cellulitis of right foot - He has been on antibiotics for this and he is followed closely by his podiatrist for this.  We are temporarily holding his oral antibiotics while he is on IV antibiotics for pneumonia.  Continue local wound care. -- the cellulitis seems to have resolved now         Family Communication: no  Family at bedside  Consultants:  cardiology  Code Status: DNR  DVT Prophylaxis:  SCDs   Procedures: As Listed in Progress Note  Above  Antibiotics: None     Subjective: Complains of low back pain x 2 days.  Denies leg weakness or radicular s/s.  Denies bladder or bowel incontinence.  Denies f/,c cp, sob, n/v/d, abd pain.  Objective: Vitals:   12/01/21 1422 12/01/21 1943 12/01/21 2002 12/02/21 0503  BP:   (!) 146/67 134/63  Pulse:   85 62  Resp:   18 18  Temp:   (!) 97.4 F (36.3 C) 98 F (36.7 C)  TempSrc:   Oral Oral  SpO2: 90% 91% 93% 96%  Weight:    84.3 kg  Height:        Intake/Output Summary (Last 24 hours) at 12/02/2021 1151 Last data filed at 12/02/2021 0900 Gross per 24 hour  Intake 660 ml  Output 2600 ml  Net -1940 ml   Weight change: -2.1 kg Exam:  General:  Pt is alert, follows commands appropriately, not in acute distress HEENT: No icterus, No thrush, No neck mass, [place]/AT Cardiovascular: RRR, S1/S2, no rubs, no gallops Respiratory: bibasilar crackles.  no wheezing, no crackles, no rhonchi Abdomen: Soft/+BS, non tender, non distended, no guarding Extremities: No edema, No lymphangitis, No petechiae, No rashes, no synovitis Neuro:  CN II-XII intact, strength 4-/5 in RUE, RLE, strength 4-/5 LUE, LLE;  sensation intact bilateral; no dysmetria; babinski equivocal    Data Reviewed: I have personally reviewed following labs and imaging studies Basic Metabolic Panel: Recent Labs  Lab 11/28/21 0925 11/29/21 0328 11/30/21 0401 12/01/21 0433 12/02/21 0425  NA 138 138 136 138 137  K 4.3 4.1 4.2 4.4 3.8  CL 108 111 111 111 108  CO2 22 18* 19* 23 24  GLUCOSE 223* 263* 90 156* 138*  BUN 22 27* 32* 31* 29*  CREATININE 1.13 1.17 1.21 1.20 1.20  CALCIUM 8.2* 8.1* 7.9* 8.4* 8.2*  MG  --  2.1  --  2.3 2.2   Liver Function Tests: Recent Labs  Lab 11/28/21 0925  AST 26  ALT 23  ALKPHOS 248*  BILITOT 1.1  PROT 7.0  ALBUMIN 3.3*   No results for input(s): "LIPASE", "AMYLASE" in the last 168 hours. No results for input(s): "AMMONIA" in the last 168 hours. Coagulation  Profile: Recent Labs  Lab 11/28/21 0925  INR 1.2   CBC: Recent Labs  Lab 11/28/21 0925 11/29/21 0328 12/02/21 0425  WBC 10.8* 9.9 6.1  NEUTROABS 8.7* 8.6*  --   HGB 11.4* 10.2* 9.8*  HCT 36.2* 32.5* 30.6*  MCV 94.0 92.9 92.7  PLT 197 169 138*   Cardiac Enzymes: Recent Labs  Lab 11/28/21 0925  CKTOTAL 66   BNP: Invalid input(s): "POCBNP" CBG: Recent Labs  Lab 12/01/21 1751 12/01/21 2005 12/02/21 0419 12/02/21 0729 12/02/21 1110  GLUCAP 221* 197* 135* 138* 183*   HbA1C: No results for input(s): "HGBA1C" in the last 72 hours. Urine analysis:    Component Value Date/Time   COLORURINE YELLOW 11/28/2021 [place] 11/28/2021 0949   LABSPEC 1.022 11/28/2021 0949   PHURINE 5.0 11/28/2021 0949   GLUCOSEU >=500 (A) 11/28/2021 0949   HGBUR SMALL (A) 11/28/2021 0949   BILIRUBINUR NEGATIVE 11/28/2021 0949   KETONESUR 20 (A) 11/28/2021 0949   PROTEINUR 100 (A) 11/28/2021 0949   UROBILINOGEN 0.2 06/13/2012 0325   NITRITE NEGATIVE 11/28/2021 0949   LEUKOCYTESUR NEGATIVE 11/28/2021 0949   Sepsis Labs: @LABRCNTIP (procalcitonin:4,lacticidven:4) ) Recent Results (from the past 240 hour(s))  Blood culture (routine x 2)     Status: None (Preliminary result)   Collection Time: 11/28/21  9:08 AM   Specimen: Right Antecubital; Blood  Result Value Ref Range Status   Specimen Description   Final    RIGHT ANTECUBITAL BOTTLES DRAWN AEROBIC AND ANAEROBIC   Special Requests   Final    Blood Culture results may not be optimal due to an excessive volume of blood received in culture bottles   Culture   Final    NO GROWTH 4 DAYS Performed at [facility], [address], [place], [place] [address]    Report Status PENDING  Incomplete  Blood culture (routine x 2)     Status: None (Preliminary result)   Collection Time: 11/28/21  9:20 AM   Specimen: Left Antecubital; Blood  Result Value Ref Range Status   Specimen Description   Final    LEFT ANTECUBITAL BOTTLES  DRAWN AEROBIC AND ANAEROBIC   Special Requests Blood Culture adequate volume  Final   Culture   Final    NO GROWTH 4 DAYS Performed at [facility], [address], [place], [place] [address]    Report Status PENDING  Incomplete  Urine Culture     Status: None   Collection Time: 11/28/21  9:49 AM   Specimen: In/Out Cath Urine  Result Value Ref Range Status  Specimen Description   Final    IN/OUT CATH URINE Performed at [facility], [address] [place], [place] [address]    Special Requests   Final    NONE Performed at [facility], [address], [place], [place] [address]    Culture   Final    NO GROWTH Performed at [facility], [place] [address], [place], [place] [address]    Report Status 12/01/2021 FINAL  Final  Resp Panel by RT-PCR (Flu A&B, Covid) Anterior Nasal Swab     Status: None   Collection Time: 11/28/21 10:37 AM   Specimen: Anterior Nasal Swab  Result Value Ref Range Status   SARS Coronavirus 2 by RT PCR NEGATIVE NEGATIVE Final    Comment: (NOTE) SARS-CoV-2 target nucleic acids are NOT DETECTED.  The SARS-CoV-2 RNA is generally detectable in upper respiratory specimens during the acute phase of infection. The lowest concentration of SARS-CoV-2 viral copies this assay can detect is 138 copies/mL. A negative result does not preclude SARS-Cov-2 infection and should not be used as the sole basis for treatment or other patient management decisions. A negative result may occur with  improper specimen collection/handling, submission of specimen other than nasopharyngeal swab, presence of viral mutation(s) within the areas targeted by this assay, and inadequate number of viral copies(<138 copies/mL). A negative result must be combined with clinical observations, patient history, and epidemiological information. The expected result is Negative.  Fact Sheet for Patients:  EntrepreneurPulse.com.au  Fact Sheet for Healthcare Providers:   IncredibleEmployment.be  This test is no t yet approved or cleared by the [place] FDA and  has been authorized for detection and/or diagnosis of SARS-CoV-2 by FDA under an Emergency Use Authorization (EUA). This EUA will remain  in effect (meaning this test can be used) for the duration of the COVID-19 declaration under Section 564(b)(1) of the Act, 21 U.S.C.section 360bbb-3(b)(1), unless the authorization is terminated  or revoked sooner.       Influenza A by PCR NEGATIVE NEGATIVE Final   Influenza B by PCR NEGATIVE NEGATIVE Final    Comment: (NOTE) The Xpert Xpress SARS-CoV-2/FLU/RSV plus assay is intended as an aid in the diagnosis of influenza from Nasopharyngeal swab specimens and should not be used as a sole basis for treatment. Nasal washings and aspirates are unacceptable for Xpert Xpress SARS-CoV-2/FLU/RSV testing.  Fact Sheet for Patients: EntrepreneurPulse.com.au  Fact Sheet for Healthcare Providers: IncredibleEmployment.be  This test is not yet approved or cleared by the [place] FDA and has been authorized for detection and/or diagnosis of SARS-CoV-2 by FDA under an Emergency Use Authorization (EUA). This EUA will remain in effect (meaning this test can be used) for the duration of the COVID-19 declaration under Section 564(b)(1) of the Act, 21 U.S.C. section 360bbb-3(b)(1), unless the authorization is terminated or revoked.  Performed at [facility], [address], [place], [place] [address]   MRSA Next Gen by PCR, Nasal     Status: None   Collection Time: 11/28/21  1:01 PM   Specimen: Nasal Mucosa; Nasal Swab  Result Value Ref Range Status   MRSA by PCR Next Gen NOT DETECTED NOT DETECTED Final    Comment: (NOTE) The GeneXpert MRSA Assay (FDA approved for NASAL specimens only), is one component of a comprehensive MRSA colonization surveillance program. It is not intended to diagnose MRSA  infection nor to guide or monitor treatment for MRSA infections. Test performance is not FDA approved in patients less than 46 years old.  Performed at [facility], [address], [place], [place] [address]      Scheduled Meds:  acetaminophen  1,000 mg Oral Q6H   Chlorhexidine Gluconate Cloth  6 each Topical Daily   doxycycline  100 mg Oral Q12H   furosemide  40 mg Intravenous Daily   guaiFENesin  600 mg Oral BID   insulin aspart  0-9 Units Subcutaneous TID WC   insulin aspart  3 Units Subcutaneous TID WC   linagliptin  5 mg Oral Daily   losartan  50 mg Oral Daily   metoprolol succinate  12.5 mg Oral Daily   nitroGLYCERIN  0.2 mg Transdermal Daily   senna-docusate  1 tablet Oral QHS   Continuous Infusions:  Procedures/Studies: DG CHEST PORT 1 VIEW  Result Date: 12/01/2021 CLINICAL DATA:  29562. Admitted with CHF and multifocal pneumonia. Recent history of cellulitis lower extremity and metabolic encephalopathy. EXAM: PORTABLE CHEST 1 VIEW COMPARISON:  Portable chest 11/28/2021 FINDINGS: 4:51 a.m. Mild cardiomegaly. There is increased perihilar vascular congestion and moderate interstitial pulmonary edema with a basal gradient There are increased perihilar opacities radiating into the lower lung fields most likely indicating alveolar edema, with underlying pneumonia not excluded. Left lower lobe consolidation continues to be seen in the retrocardiac space and there are moderate increased pleural effusions. The upper 1/3 of the lungs remain clear of focal opacities but there is increased apical interstitial edema. There is aortic atherosclerosis with stable mediastinum. Degenerative change thoracic spine. IMPRESSION: 1. Worsening CHF pattern with increased central vascular prominence and moderate pulmonary edema. 2. Perihilar opacities most likely from alveolar edema, underlying pneumonia not excluded. 3. Persistent left lower lobe consolidation with increasing bilateral moderate pleural  effusions. 4. Aortic atherosclerosis. Electronically Signed   By: Almira Bar M.D.   On: 12/01/2021 06:54   ECHOCARDIOGRAM COMPLETE  Result Date: 11/29/2021    ECHOCARDIOGRAM REPORT   Patient Name:   Wayne [name] Date of Exam: 11/29/2021 Medical Rec #:  [id number]         Height:       72.0 in Accession #:    [id number]        Weight:       198.0 lb Date of Birth:  Aug 29, 1940        BSA:          2.121 m Patient Age:    80 years          BP:           136/61 mmHg Patient Gender: M                 HR:           81 bpm. [facility] Procedure: 2D Echo, Cardiac Doppler and Color Doppler Indications:    CAD Native Vessel I25.10, Congestive Heart Failure I50.9  History:        Patient has no prior history of Echocardiogram examinations.                 CAD; Risk Factors:Dyslipidemia, Diabetes and Hypertension. Hx                 S/P percutaneous coronary angioplasty.  Sonographer:    Celesta Gentile RCS Referring Phys: [phone] [facility] L J[facility]NSON IMPRESSIONS  1. Left ventricular ejection fraction, by estimation, is 35%. The left ventricle has moderately decreased function. The left ventricle demonstrates regional wall motion abnormalities (see scoring diagram/findings for description). Left ventricular diastolic parameters are consistent with  Grade II diastolic dysfunction (pseudonormalization).  2. Right ventricular systolic function is normal. The right ventricular size is normal. There is severely elevated pulmonary artery systolic pressure. The estimated right ventricular systolic pressure is 0000000 mmHg.  3. Left atrial size was mildly dilated.  4. The mitral valve is grossly normal. Trivial mitral valve regurgitation. No evidence of mitral stenosis.  5. The aortic valve is normal in structure. There is mild calcification of the aortic valve. Aortic valve regurgitation is not visualized. No aortic stenosis is present.  6. The inferior vena cava is dilated in size with <50% respiratory variability,  suggesting right atrial pressure of 15 mmHg. Conclusion(s)/Recommendation(s): No left ventricular mural or apical thrombus/thrombi. Swirling apical contrast is seen indicative of slow flow. FINDINGS  Left Ventricle: Left ventricular ejection fraction, by estimation, is 35%. The left ventricle has moderately decreased function. The left ventricle demonstrates regional wall motion abnormalities. Definity contrast agent was given IV to delineate the left ventricular endocardial borders. The left ventricular internal cavity size was normal in size. There is no left ventricular hypertrophy. Left ventricular diastolic parameters are consistent with Grade II diastolic dysfunction (pseudonormalization).  LV Wall Scoring: The mid and distal anterior wall, mid and distal lateral wall, mid and distal anterior septum, mid anterolateral segment, mid inferoseptal segment, and apical inferior segment are hypokinetic. Right Ventricle: The right ventricular size is normal. No increase in right ventricular wall thickness. Right ventricular systolic function is normal. There is severely elevated pulmonary artery systolic pressure. The tricuspid regurgitant velocity is 3.76 m/s, and with an assumed right atrial pressure of 15 mmHg, the estimated right ventricular systolic pressure is 0000000 mmHg. Left Atrium: Left atrial size was mildly dilated. Right Atrium: Right atrial size was normal in size. Pericardium: Trivial pericardial effusion is present. Mitral Valve: The mitral valve is grossly normal. Trivial mitral valve regurgitation. No evidence of mitral valve stenosis. Tricuspid Valve: The tricuspid valve is normal in structure. Tricuspid valve regurgitation is mild . No evidence of tricuspid stenosis. Aortic Valve: The aortic valve is normal in structure. There is mild calcification of the aortic valve. Aortic valve regurgitation is not visualized. No aortic stenosis is present. Pulmonic Valve: The pulmonic valve was normal in  structure. Pulmonic valve regurgitation is trivial. No evidence of pulmonic stenosis. Aorta: The aortic root is normal in size and structure. Ascending aorta measurements are within normal limits for age when indexed to body surface area. Venous: The inferior vena cava is dilated in size with less than 50% respiratory variability, suggesting right atrial pressure of 15 mmHg. IAS/Shunts: No atrial level shunt detected by color flow Doppler.  LEFT VENTRICLE PLAX 2D LVIDd:         4.80 cm   Diastology LVIDs:         3.70 cm   LV e' medial:    6.31 cm/s LV PW:         1.10 cm   LV E/e' medial:  16.6 LV IVS:        1.20 cm   LV e' lateral:   6.96 cm/s LVOT diam:     2.20 cm   LV E/e' lateral: 15.1 LV SV:         63 LV SV Index:   30 LVOT Area:     3.80 cm  RIGHT VENTRICLE RV S prime:     14.80 cm/s TAPSE (M-mode): 2.5 cm LEFT ATRIUM             Index  RIGHT ATRIUM           Index LA diam:        4.40 cm 2.07 cm/m   RA Area:     17.20 cm LA Vol (A2C):   68.6 ml 32.34 ml/m  RA Volume:   48.90 ml  23.05 ml/m LA Vol (A4C):   82.8 ml 39.03 ml/m LA Biplane Vol: 76.4 ml 36.01 ml/m  AORTIC VALVE LVOT Vmax:   70.50 cm/s LVOT Vmean:  55.400 cm/s LVOT VTI:    0.165 m  AORTA Ao Root diam: 3.90 cm MITRAL VALVE                TRICUSPID VALVE MV Area (PHT): 5.66 cm     TR Peak grad:   56.6 mmHg MV Decel Time: 134 msec     TR Vmax:        376.00 cm/s MV E velocity: 105.00 cm/s MV A velocity: 62.10 cm/s   SHUNTS MV E/A ratio:  1.69         Systemic VTI:  0.16 m                             Systemic Diam: 2.20 cm Cherlynn Kaiser MD Electronically signed by Cherlynn Kaiser MD Signature Date/Time: 11/29/2021/11:51:57 AM    Final    CT HEAD WO CONTRAST  Result Date: 11/28/2021 CLINICAL DATA:  Follow-up subdural hematoma EXAM: CT HEAD WITHOUT CONTRAST TECHNIQUE: Contiguous axial images were obtained from the base of the skull through the vertex without intravenous contrast. RADIATION DOSE REDUCTION: This exam was performed  according to the departmental dose-optimization program which includes automated exposure control, adjustment of the mA and/or kV according to patient size and/or use of iterative reconstruction technique. COMPARISON:  11/28/2021, 10:17 a.m. FINDINGS: Brain: Unchanged, minimal left parafalcine subdural hemorrhage (series 4, image 22). No evidence of acute infarction, new hemorrhage, hydrocephalus, new extra-axial collection or mass lesion/mass effect. Vascular: No hyperdense vessel or unexpected calcification. Skull: Normal. Negative for fracture or focal lesion. Sinuses/Orbits: No acute finding. Other: Large soft tissue contusion of the left scalp vertex (series 4, image 15). IMPRESSION: 1. Unchanged, minimal left parafalcine subdural hemorrhage. No new or enlarging hemorrhage. 2. Large soft tissue contusion of the left scalp vertex. Electronically Signed   By: Delanna Ahmadi M.D.   On: 11/28/2021 16:15   CT Head Wo Contrast  Addendum Date: 11/28/2021   ADDENDUM REPORT: 11/28/2021 10:42 ADDENDUM: Study discussed by telephone with PA TAMMY TRIPLETT on 11/28/2021 at 1037 hours. Electronically Signed   By: Genevie Ann M.D.   On: 11/28/2021 10:42   Result Date: 11/28/2021 CLINICAL DATA:  81 year old male found down. EXAM: CT HEAD WITHOUT CONTRAST TECHNIQUE: Contiguous axial images were obtained from the base of the skull through the vertex without intravenous contrast. RADIATION DOSE REDUCTION: This exam was performed according to the departmental dose-optimization program which includes automated exposure control, adjustment of the mA and/or kV according to patient size and/or use of iterative reconstruction technique. COMPARISON:  Head CT 11/12/2021. FINDINGS: Brain: Trace anterior para falcine subdural blood suspected on series 3, image 18. No mass effect. No other acute intracranial hemorrhage identified. No midline shift, ventriculomegaly, mass effect, evidence of mass lesion, or evidence of cortically based acute  infarction. No IVH. Stable gray-white matter differentiation throughout the brain. Vascular: Calcified atherosclerosis at the skull base. No suspicious intracranial vascular hyperdensity. Skull: Stable.  No acute osseous abnormality identified. Sinuses/Orbits:  Visualized paranasal sinuses and mastoids are stable and well aerated. Other: Broad-based new left posterior convexity scalp hematoma. Underlying calvarium appears stable and intact. Stable orbits soft tissues. Calcified scalp vessel atherosclerosis. IMPRESSION: 1. Trace anterior para-falcine subdural blood no mass effect. No other acute intracranial abnormality. 2. Acute left posterior convexity scalp hematoma without underlying skull fracture. Electronically Signed: By: Odessa Fleming M.D. On: 11/28/2021 10:34   CT Hip Right Wo Contrast  Result Date: 11/28/2021 CLINICAL DATA:  Patient BIB by [facility] after girlfriend to home this am to find patient on floor. Patient reports that he fell walking up steps around 0730 however, EMS believes that patient may have fallen overnight. Patient is confused on arrival. Found on second story of home after he crawled to couch. Complaining of right hip/lower back pain from fall. EXAM: CT OF THE RIGHT HIP WITHOUT CONTRAST TECHNIQUE: Multidetector CT imaging of the right hip was performed according to the standard protocol. Multiplanar CT image reconstructions were also generated. RADIATION DOSE REDUCTION: This exam was performed according to the departmental dose-optimization program which includes automated exposure control, adjustment of the mA and/or kV according to patient size and/or use of iterative reconstruction technique. COMPARISON:  None Available. FINDINGS: Bones/Joint/Cartilage No fracture.  No bone lesion. Superolateral right hip joint space narrowing. Small marginal spurs from the base of the right femoral head and bony prominence from the superior right acetabulum, findings consistent with osteoarthritis. No  convincing joint effusion. Ligaments Suboptimally assessed by CT. Muscles and Tendons No evidence of a muscle hematoma/injury. Tendons are grossly intact. Soft tissues No acute findings. IMPRESSION: 1. No fracture or acute finding. 2. Mild right hip joint osteoarthritis. Electronically Signed   By: Amie Portland M.D.   On: 11/28/2021 10:39   DG Chest Port 1 View  Result Date: 11/28/2021 CLINICAL DATA:  Patient found on the floor this morning. Patient reports that he fell walking up steps this morning. EXAM: PORTABLE CHEST 1 VIEW COMPARISON:  11/11/2021. FINDINGS: Cardiac silhouette is normal in size. No mediastinal or hilar masses. Thickened bilateral interstitial markings. Mild hazy airspace opacity projects in the mid to lower lungs. Small pleural effusions suspected. No pneumothorax. Skeletal structures are grossly intact. IMPRESSION: 1. Interstitial thickening with hazy lung opacities and probable small effusions. Suspect pulmonary edema, with multifocal pneumonia in the differential diagnosis. Electronically Signed   By: Amie Portland M.D.   On: 11/28/2021 10:35   PERIPHERAL VASCULAR CATHETERIZATION  Result Date: 11/16/2021 DATE OF SERVICE: 11/13/2021  PATIENT:  [name]  81 y.o. 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 [name], MD  ASSISTANT: none  ANESTHESIA:   local  ESTIMATED BLOOD LOSS: minimal  LOCAL MEDICATIONS USED:  LIDOCAINE  COUNTS: confirmed correct.  PATIENT DISPOSITION:  PACU - hemodynamically stable.  Delay start of Pharmacological VTE agent (>24hrs) due to surgical blood loss or risk of bleeding: no  INDICATION FOR PROCEDURE: Tristain Daily is a 81 y.o. male with right foot ulcer  with peripheral arterial disease. After careful discussion of risks, benefits, and alternatives the patient was offered angiography. The patient understood and wished to proceed.  OPERATIVE FINDINGS: Terminal aorta and iliac arteries: Widely patent without flow-limiting stenosis  Right lower extremity: Common femoral artery: Widely patent without  flow-limiting stenosis Profunda femoris artery: Widely patent without flow-limiting stenosis Superficial femoral artery: Diffusely diseased.  No stenosis greater than 50%. Popliteal artery: Diffusely diseased.  Widely patent. Anterior tibial artery: Occluded Tibioperoneal trunk: Widely patent Peroneal artery: Widely patent to the ankle.  Arborization to the PT, filling the foot. Posterior tibial artery: Patent at its origin.  Patent about the ankle.  Fills the pedal circulation. Pedal circulation: Disadvantaged.  Fills only through PT.  Small vessel disease noted.  GLASS score.  Not applicable.  Inline flow to the ankle.  Wifi score 1 / 1 / 2.  Clinical stage III.  DESCRIPTION OF PROCEDURE: After identification of the patient in the pre-operative holding area, the patient was transferred to the operating room. The patient was positioned supine on the operating room table. The groins were prepped and draped in standard fashion. A surgical pause was performed confirming correct patient, procedure, and operative location.  The left groin was anesthetized with subcutaneous injection of 1% lidocaine. Using ultrasound guidance, the left common femoral artery was accessed with micropuncture technique. Fluoroscopy was used to confirm cannulation over the femoral head. The 57F sheath was upsized to 34F.  A Benson wire was advanced into the distal aorta. Over the wire an omni flush catheter was advanced to the level of L2. Aortogram was performed - see above for details.  The right common iliac artery was selected with an omniflush catheter and Bentson guidewire. The wire was advanced  into the common femoral artery. Over the wire the omni flush catheter was advanced into the external iliac artery. Selective angiography was performed - see above for details.  The decision was made to intervene. The patient was heparinized with 8000 units of heparin. The 34F sheath was exchanged for a 58F x 45cm sheath. Selective angiography of the left lower extremity was performed prior to intervention.  I tried to recanalize the posterior tibial artery with a variety of wire and catheter exchanges.  I was unable to cross the lesion antegrade.  I elected to stop here.  All endovascular equipment was then removed.  A minx device was used to close the arteriotomy. Hemostasis was excellent upon completion.  Upon completion of the case instrument and sharps counts were confirmed correct. The patient was transferred to the PACU in good condition. I was present for all portions of the procedure.  PLAN: ASA 81mg  PO QD. High intensity statin therapy.  Inline flow to the ankle.  Patient optimized from a vascular standpoint.  Would recommend local care to the toe only.  Would not recommend amputation at this time.  We will follow closely clinically.  If he has deterioration, we could consider retrograde posterior tibial access.  Yevonne Aline. Stanford Breed, MD [facility] Phone Number: [phone] 11/13/2021 9:53 AM   VAS [place] ABI WITH/WO TBI  Result Date: 11/12/2021  LOWER EXTREMITY DOPPLER STUDY Patient Name:  STATLER BROAS  Date of Exam:   11/12/2021 Medical Rec #: [id number]          Accession #:    [id number] Date of Birth: 01/11/1941         Patient Gender: M Patient Age:   55 years Exam Location:  [facility] Procedure:      VAS [place] ABI WITH/WO TBI Referring Phys: Gean Birchwood --------------------------------------------------------------------------------  Indications: Pain in right toe. High Risk Factors: Diabetes.  Comparison Study: No previous exam noted. Performing  Technologist: Bobetta Lime BS, RVT  Examination Guidelines: A  complete evaluation includes at minimum, Doppler waveform signals and systolic blood pressure reading at the level of bilateral brachial, anterior tibial, and posterior tibial arteries, when vessel segments are accessible. Bilateral testing is considered an integral part of a complete examination. Photoelectric Plethysmograph (PPG) waveforms and toe systolic pressure readings are included as required and additional duplex testing as needed. Limited examinations for reoccurring indications may be performed as noted.  ABI Findings: +---------+------------------+-----+-------------------+--------+ Right    Rt Pressure (mmHg)IndexWaveform           Comment  +---------+------------------+-----+-------------------+--------+ Brachial 157                    triphasic                   +---------+------------------+-----+-------------------+--------+ PTA      255               1.60 dampened monophasic         +---------+------------------+-----+-------------------+--------+ DP       103               0.65 dampened monophasic         +---------+------------------+-----+-------------------+--------+ Great Toe49                0.31                             +---------+------------------+-----+-------------------+--------+ +---------+------------------+-----+--------------------+-------+ Left     Lt Pressure (mmHg)IndexWaveform            Comment +---------+------------------+-----+--------------------+-------+ Brachial 159                    triphasic                   +---------+------------------+-----+--------------------+-------+ PTA      255               1.60 dampened monophasic         +---------+------------------+-----+--------------------+-------+ DP       255               1.60 Hyperemic/monophasic        +---------+------------------+-----+--------------------+-------+ Great Toe66                 0.42                             +---------+------------------+-----+--------------------+-------+ +-------+-----------+-----------+------------+------------+ ABI/TBIToday's ABIToday's TBIPrevious ABIPrevious TBI +-------+-----------+-----------+------------+------------+ Right  [place]         0.31                                +-------+-----------+-----------+------------+------------+ Left   [place]         0.42                                +-------+-----------+-----------+------------+------------+  Summary: Right: Resting right ankle-brachial index indicates noncompressible right lower extremity arteries. The right toe-brachial index is abnormal. Left: Resting left ankle-brachial index indicates noncompressible left lower extremity arteries. The left toe-brachial index is abnormal. *See table(s) above for measurements and observations.  Electronically signed by Sherald Hess MD on 11/12/2021 at 3:13:14 PM.    Final    MR FOOT RIGHT WO CONTRAST  Result Date: 11/12/2021 CLINICAL DATA:  Foot swelling, nondiabetic, osteomyelitis suspected  EXAM: MRI OF THE RIGHT FOREFOOT WITHOUT CONTRAST TECHNIQUE: Multiplanar, multisequence MR imaging of the right forefoot was performed. No intravenous contrast was administered. COMPARISON:  Right foot radiograph 11/11/2021 FINDINGS: Bones/Joint/Cartilage Nondisplaced fracture at the base of the second toe proximal phalanx, which may be subacute. Minimal marrow edema in the proximal phalanx. No other significant marrow signal alteration. Ligaments Intact Lisfranc ligament.  Intact MTP collateral ligaments. Muscles and Tendons No acute tendon tear. Diffuse intramuscular edema and atrophy in the foot as is commonly seen in diabetics. Soft tissues Generalized soft tissue swelling of the foot, with confluent edema along the second toe (series 4 images 8-15). No focal fluid collection. There is an adjacent blister like skin lesion. IMPRESSION: Nondisplaced fracture  at base of the second toe proximal phalanx, which may be subacute. Adjacent confluent edema and a blister-like skin lesion noted along the plantar aspect of the second toe noted. No evidence of soft tissue abscess or osteomyelitis. Electronically Signed   By: Maurine Simmering M.D.   On: 11/12/2021 10:05   CT HEAD WO CONTRAST (5MM)  Result Date: 11/12/2021 CLINICAL DATA:  81 year old male with altered mental status. EXAM: CT HEAD WITHOUT CONTRAST TECHNIQUE: Contiguous axial images were obtained from the base of the skull through the vertex without intravenous contrast. RADIATION DOSE REDUCTION: This exam was performed according to the departmental dose-optimization program which includes automated exposure control, adjustment of the mA and/or kV according to patient size and/or use of iterative reconstruction technique. COMPARISON:  CT cervical spine 02/27/2009. FINDINGS: Brain: Cerebral volume loss appears fairly generalized. No midline shift, ventriculomegaly, mass effect, evidence of mass lesion, intracranial hemorrhage or evidence of cortically based acute infarction. Gray-white matter differentiation is within normal limits for age in both hemispheres. No cortical encephalomalacia identified. Vascular: Extensive Calcified atherosclerosis at the skull base. No suspicious intracranial vascular hyperdensity. Skull: No acute osseous abnormality identified. Sinuses/Orbits: Visualized paranasal sinuses and mastoids are well aerated. Other: No acute orbit or scalp soft tissue finding. Fairly extensive calcified scalp vessel atherosclerosis. IMPRESSION: 1. No acute intracranial abnormality. Unremarkable for age noncontrast CT appearance of the brain. 2. Advanced calcified atherosclerosis, including involvement of scalp vessels. This appearance can be seen with End stage renal disease. Electronically Signed   By: Genevie Ann M.D.   On: 11/12/2021 05:49   DG Foot Complete Right  Result Date: 11/11/2021 CLINICAL DATA:   Right foot wound infection. EXAM: RIGHT FOOT COMPLETE - 3+ VIEW COMPARISON:  None Available. FINDINGS: There is no evidence of fracture or dislocation. No lytic destruction is noted. Vascular calcifications are noted. Mild posterior calcaneal spurring is noted. IMPRESSION: No acute abnormality seen. Electronically Signed   By: Marijo Conception M.D.   On: 11/11/2021 12:35   DG Chest 2 View  Result Date: 11/11/2021 CLINICAL DATA:  Suspected sepsis EXAM: CHEST - 2 VIEW COMPARISON:  None FINDINGS: Heart is normal size. No confluent airspace opacities or effusions. Old healed left rib fracture. No acute bony abnormality. IMPRESSION: No active cardiopulmonary disease. Electronically Signed   By: Rolm Baptise M.D.   On: 11/11/2021 11:01    Orson Eva, DO  [facility]  If 7PM-7AM, please contact night-coverage [email or web address] Password [email or web address] 12/02/2021, 11:51 AM   LOS: 4 days

## 2021-12-02 NOTE — Care Management Important Message (Signed)
Important Message  Patient Details  Name: Wayne [name] MRN: [id number] Date of Birth: 1940-10-15   Medicare Important Message Given:  Yes     Corey Harold 12/02/2021, 1:30 PM

## 2021-12-02 NOTE — Assessment & Plan Note (Addendum)
Baseline creatinine 1.1-1.3 Serum creatinine 1.17 on day of dc Monitor with diuresis

## 2021-12-02 NOTE — TOC Progression Note (Addendum)
Transition of Care [facility]) - Progression Note    Patient Details  Name: Wayne [name] MRN: [id number] Date of Birth: [birth year]  Transition of Care [facility]) CM/SW Contact  Karn Cassis, [place] Phone Number: 12/02/2021, 10:36 AM  Clinical Narrative:  SNF authorization received (plan auth ID: [id number] Mixer ID: [id number]). Next review 9/8. [employer] updated.       Expected Discharge Plan: Skilled Nursing Facility Barriers to Discharge: Continued Medical Work up  Expected Discharge Plan and Services Expected Discharge Plan: Skilled Nursing Facility In-house Referral: Clinical Social Work   Post Acute Care Choice: Skilled Nursing Facility Living arrangements for the past 2 months: Single Family Home                                       Social Determinants of Health (SDOH) Interventions    Readmission Risk Interventions    11/12/2021    2:59 PM  Readmission Risk Prevention Plan  Post Dischage Appt Complete  Medication Screening Complete  Transportation Screening Complete

## 2021-12-03 ENCOUNTER — Inpatient Hospital Stay (HOSPITAL_COMMUNITY): Payer: Medicare Other

## 2021-12-03 DIAGNOSIS — S32010A Wedge compression fracture of first lumbar vertebra, initial encounter for closed fracture: Secondary | ICD-10-CM

## 2021-12-03 DIAGNOSIS — S32000A Wedge compression fracture of unspecified lumbar vertebra, initial encounter for closed fracture: Secondary | ICD-10-CM

## 2021-12-03 DIAGNOSIS — E538 Deficiency of other specified B group vitamins: Secondary | ICD-10-CM | POA: Diagnosis not present

## 2021-12-03 DIAGNOSIS — D518 Other vitamin B12 deficiency anemias: Secondary | ICD-10-CM

## 2021-12-03 DIAGNOSIS — I5021 Acute systolic (congestive) heart failure: Secondary | ICD-10-CM | POA: Diagnosis not present

## 2021-12-03 DIAGNOSIS — J9601 Acute respiratory failure with hypoxia: Secondary | ICD-10-CM | POA: Diagnosis not present

## 2021-12-03 LAB — BASIC METABOLIC PANEL
Anion gap: 6 (ref 5–15)
BUN: 32 mg/dL — ABNORMAL HIGH (ref 8–23)
CO2: 23 mmol/L (ref 22–32)
Calcium: 8.4 mg/dL — ABNORMAL LOW (ref 8.9–10.3)
Chloride: 110 mmol/L (ref 98–111)
Creatinine, Ser: 1.13 mg/dL (ref 0.61–1.24)
GFR, Estimated: >60 mL/min (ref >60)
Glucose, Bld: 90 mg/dL (ref 70–99)
Potassium: 3.9 mmol/L (ref 3.5–5.1)
Sodium: 139 mmol/L (ref 135–145)

## 2021-12-03 LAB — GLUCOSE, CAPILLARY
Glucose-Capillary: 104 mg/dL — ABNORMAL HIGH (ref 70–99)
Glucose-Capillary: 145 mg/dL — ABNORMAL HIGH (ref 70–99)
Glucose-Capillary: 190 mg/dL — ABNORMAL HIGH (ref 70–99)
Glucose-Capillary: 161 mg/dL — ABNORMAL HIGH (ref 70–99)

## 2021-12-03 LAB — CULTURE, BLOOD (ROUTINE X 2)
Culture: NO GROWTH
Culture: NO GROWTH
Special Requests: ADEQUATE

## 2021-12-03 LAB — CBC
HCT: 32.9 % — ABNORMAL LOW (ref 39.0–52.0)
Hemoglobin: 10.5 g/dL — ABNORMAL LOW (ref 13.0–17.0)
MCH: 29.7 pg (ref 26.0–34.0)
MCHC: 31.9 g/dL (ref 30.0–36.0)
MCV: 93.2 fL (ref 80.0–100.0)
Platelets: 139 10*3/uL — ABNORMAL LOW (ref 150–400)
RBC: 3.53 MIL/uL — ABNORMAL LOW (ref 4.22–5.81)
RDW: 13.6 % (ref 11.5–15.5)
WBC: 6.5 10*3/uL (ref 4.0–10.5)
nRBC: 0 % (ref 0.0–0.2)

## 2021-12-03 MED ORDER — SACUBITRIL-VALSARTAN 49-51 MG PO TABS
1.0000 | ORAL_TABLET | Freq: Two times a day (BID) | ORAL | Status: DC
  Administered 2021-12-03 – 2021-12-04 (×3): 1 via ORAL
  Filled 2021-12-03 (×3): qty 1

## 2021-12-03 MED ORDER — SACUBITRIL-VALSARTAN 49-51 MG PO TABS: 1.0000 | ORAL_TABLET | Freq: Two times a day (BID) | ORAL | Status: DC

## 2021-12-03 MED ORDER — DAPAGLIFLOZIN PROPANEDIOL 10 MG PO TABS: 10.0000 mg | ORAL_TABLET | Freq: Every day | ORAL | Status: DC

## 2021-12-03 MED ORDER — DAPAGLIFLOZIN PROPANEDIOL 10 MG PO TABS
10.0000 mg | ORAL_TABLET | Freq: Every day | ORAL | Status: DC
  Administered 2021-12-03 – 2021-12-04 (×2): 10 mg via ORAL
  Filled 2021-12-03 (×2): qty 1

## 2021-12-03 MED ORDER — CEFDINIR 300 MG PO CAPS
300.0000 mg | ORAL_CAPSULE | Freq: Two times a day (BID) | ORAL | Status: DC
  Administered 2021-12-03 – 2021-12-04 (×3): 300 mg via ORAL
  Filled 2021-12-03 (×3): qty 1

## 2021-12-03 MED ORDER — FUROSEMIDE 40 MG PO TABS
40.0000 mg | ORAL_TABLET | Freq: Every day | ORAL | Status: DC
Start: 2021-12-04 — End: 2021-12-04
  Administered 2021-12-04: 40 mg via ORAL
  Filled 2021-12-03: qty 1

## 2021-12-03 MED ORDER — SPIRONOLACTONE 12.5 MG HALF TABLET
12.5000 mg | ORAL_TABLET | Freq: Every day | ORAL | Status: DC
  Administered 2021-12-03 – 2021-12-04 (×2): 12.5 mg via ORAL
  Filled 2021-12-03 (×2): qty 1

## 2021-12-03 MED ORDER — FUROSEMIDE 40 MG PO TABS: 40.0000 mg | ORAL_TABLET | Freq: Every day | ORAL | Status: DC

## 2021-12-03 NOTE — TOC Progression Note (Signed)
Transition of Care [facility]) - Progression Note    Patient Details  Name: Mahdi Frye MRN: [id number] Date of Birth: 02-May-1940  Transition of Care [facility]) CM/SW Contact  Annice Needy, LCSW Phone Number: 12/03/2021, 1:25 PM  Clinical Narrative:    Patient's daughter, [name], indicated that they would like to switch to [facility]. GraceAnne with the facility confirms that they can admit patient tomorrow.  Facility information updated electronically in [employer] portal.    Expected Discharge Plan: Skilled Nursing Facility Barriers to Discharge: Continued Medical Work up  Expected Discharge Plan and Services Expected Discharge Plan: Skilled Nursing Facility In-house Referral: Clinical Social Work   Post Acute Care Choice: Skilled Nursing Facility Living arrangements for the past 2 months: Single Family Home                                       Social Determinants of Health (SDOH) Interventions    Readmission Risk Interventions    11/12/2021    2:59 PM  Readmission Risk Prevention Plan  Post Dischage Appt Complete  Medication Screening Complete  Transportation Screening Complete

## 2021-12-03 NOTE — Assessment & Plan Note (Signed)
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

## 2021-12-03 NOTE — Progress Notes (Signed)
Orthopedic Tech Progress Note Patient Details:  Wayne [name] October 07, 1940 [id number]  Order for TLSO called into [employer] at 1518.  Patient ID: Wayne [name], male   DOB: 1940-11-25, 81 y.o.   MRN: [id number]  Docia Furl 12/03/2021, 3:19 PM

## 2021-12-03 NOTE — Progress Notes (Addendum)
Progress Note  Patient Name: Wayne [name] Date of Encounter: 12/03/2021  Primary Cardiologist: Christell Constant, MD  Subjective   No CP or SOB. Now on RA with normal sats. Still has hip pain but otherwise no cardiac complaints.  Inpatient Medications    Scheduled Meds:  acetaminophen  1,000 mg Oral Q6H   cefdinir  300 mg Oral Q12H   Chlorhexidine Gluconate Cloth  6 each Topical Daily   doxycycline  100 mg Oral Q12H   furosemide  40 mg Intravenous Daily   guaiFENesin  600 mg Oral BID   insulin aspart  0-9 Units Subcutaneous TID WC   insulin aspart  3 Units Subcutaneous TID WC   linagliptin  5 mg Oral Daily   losartan  50 mg Oral Daily   metoprolol succinate  12.5 mg Oral Daily   nitroGLYCERIN  0.2 mg Transdermal Daily   senna-docusate  1 tablet Oral QHS   Continuous Infusions:  PRN Meds: bisacodyl, dextromethorphan, ipratropium-albuterol, ondansetron **OR** ondansetron (ZOFRAN) IV, oxyCODONE, polyethylene glycol, traZODone   Vital Signs    Vitals:   12/02/21 1217 12/02/21 2142 12/03/21 0422 12/03/21 0451  BP: (!) 152/79 128/68 (!) 167/84   Pulse: 72 63 77   Resp: 18 16 16    Temp: 98.4 F (36.9 C) 98.2 F (36.8 C) 97.8 F (36.6 C)   TempSrc:  Oral Oral   SpO2: 97% 97% 96%   Weight:    84.2 kg  Height:        Intake/Output Summary (Last 24 hours) at 12/03/2021 0827 Last data filed at 12/03/2021 0659 Gross per 24 hour  Intake 840 ml  Output 1650 ml  Net -810 ml      12/03/2021    4:51 AM 12/02/2021    5:03 AM 12/01/2021    5:00 AM  Last 3 Weights  Weight (lbs) 185 lb 10 oz 185 lb 13.6 oz 190 lb 7.6 oz  Weight (kg) 84.2 kg 84.3 kg 86.4 kg     Telemetry    Still not on telemetry, d/c'd yesterday as well. Will resume tele for duration of hospitalization given active diuresis - Personally Reviewed  ECG    No new tracings - Personally Reviewed  Physical Exam   GEN: No acute distress.  HEENT: Normocephalic, atraumatic, sclera non-icteric. Neck: No  JVD or bruits. Cardiac: RRR no murmurs, rubs, or gallops.  Respiratory: Clear to auscultation bilaterally. Breathing is unlabored. GI: Soft, nontender, non-distended, BS +x 4. MS: no deformity. Extremities: No clubbing or cyanosis. No edema. Distal pedal pulses are 2+ and equal bilaterally. Neuro:  A+O to self only, not place or time. Follows commands. Psych:  Pleasant affect  Labs    High Sensitivity Troponin:  No results for input(s): "TROPONINIHS" in the last 720 hours.    Cardiac EnzymesNo results for input(s): "TROPONINI" in the last 168 hours. No results for input(s): "TROPIPOC" in the last 168 hours.   Chemistry Recent Labs  Lab 11/28/21 0925 11/29/21 0328 12/01/21 0433 12/02/21 0425 12/03/21 0419  NA 138   < > 138 137 139  K 4.3   < > 4.4 3.8 3.9  CL 108   < > 111 108 110  CO2 22   < > 23 24 23   GLUCOSE 223*   < > 156* 138* 90  BUN 22   < > 31* 29* 32*  CREATININE 1.13   < > 1.20 1.20 1.13  CALCIUM 8.2*   < > 8.4* 8.2* 8.4*  PROT  7.0  --   --   --   --   ALBUMIN 3.3*  --   --   --   --   AST 26  --   --   --   --   ALT 23  --   --   --   --   ALKPHOS 248*  --   --   --   --   BILITOT 1.1  --   --   --   --   GFRNONAA >60   < > >60 >60 >60  ANIONGAP 8   < > 4* 5 6   < > = values in this interval not displayed.     Hematology Recent Labs  Lab 11/29/21 0328 12/02/21 0425 12/03/21 0419  WBC 9.9 6.1 6.5  RBC 3.50* 3.30* 3.53*  HGB 10.2* 9.8* 10.5*  HCT 32.5* 30.6* 32.9*  MCV 92.9 92.7 93.2  MCH 29.1 29.7 29.7  MCHC 31.4 32.0 31.9  RDW 13.4 13.6 13.6  PLT 169 138* 139*    BNP Recent Labs  Lab 11/29/21 0328 12/01/21 0433 12/02/21 0425  BNP 2,052.0* 1,283.0* 1,331.0*     DDimer No results for input(s): "DDIMER" in the last 168 hours.   Radiology    CT LUMBAR SPINE WO CONTRAST  Result Date: 12/02/2021 CLINICAL DATA:  Low back pain, increased fracture risk. Fall, low back pain, suspect compression fracture. EXAM: CT LUMBAR SPINE WITHOUT CONTRAST  TECHNIQUE: Multidetector CT imaging of the lumbar spine was performed without intravenous contrast administration. Multiplanar CT image reconstructions were also generated. RADIATION DOSE REDUCTION: This exam was performed according to the departmental dose-optimization program which includes automated exposure control, adjustment of the mA and/or kV according to patient size and/or use of iterative reconstruction technique. COMPARISON:  Lumbar spine radiographs 02/27/2009 FINDINGS: Segmentation: 5 lumbar type vertebrae. Alignment: Normal. Vertebrae: Horizontal fracture of the inferior aspect of the L1 vertebral body involving the inferior endplate with [id number] vertebral body height loss. Fracture cleft along the inferior endplate containing a small amount of gas. Small age indeterminate Schmorl's node type deformity of the L1 superior endplate. No retropulsion of the posterior L1 vertebral body or posterior element fracture. Paraspinal and other soft tissues: Mild paravertebral soft tissue edema at L1. Atherosclerotic vascular calcifications including in the renal hila. Disc levels: Mild disc space narrowing at L3-4. Disc bulging and posterior element hypertrophy result in mild spinal stenosis at L3-4 and moderate to severe spinal stenosis at L4-5. Mild bilateral neural foraminal stenosis at L4-5 and L5-S1. IMPRESSION: 1. Acute L1 inferior endplate compression fracture with 25% vertebral body height loss. 2. 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 3. Aortic Atherosclerosis (ICD10-I70.0). Electronically Signed   By: Logan Bores M.D.   On: 12/02/2021 16:28    Cardiac Studies   2D Echo 11/29/21   1. Left ventricular ejection fraction, by estimation, is 35%. The left  ventricle has moderately decreased function. The left ventricle  demonstrates regional wall motion abnormalities (see scoring  diagram/findings for description). Left ventricular  diastolic parameters are  consistent with Grade II diastolic dysfunction  (pseudonormalization).   2. Right ventricular systolic function is normal. The right ventricular  size is normal. There is severely elevated pulmonary artery systolic  pressure. The estimated right ventricular systolic pressure is 0000000 mmHg.   3. Left atrial size was mildly dilated.   4. The mitral valve is grossly normal. Trivial mitral valve  regurgitation. No evidence  of mitral stenosis.   5. The aortic valve is normal in structure. There is mild calcification  of the aortic valve. Aortic valve regurgitation is not visualized. No  aortic stenosis is present.   6. The inferior vena cava is dilated in size with <50% respiratory  variability, suggesting right atrial pressure of 15 mmHg.   Conclusion(s)/Recommendation(s): No left ventricular mural or apical  thrombus/thrombi. Swirling apical contrast is seen indicative of slow  flow.     Patient Profile      81 y.o. male with PAD, DM, HTN, peripheral neuropathy, former tobacco abuse, CAD (details unclear, remote stent by Dr. 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 Returned to the hospital 11/28/2021 after being found by girlfriend having fallen. Found to have confusion, hypoxia, subdural hemorrhage, and multifocal PNA.  As part of his workup had echo showing EF 35% (The mid and distal anterior wall, mid and distal lateral wall, mid and distal anterior septum, mid anterolateral segment, mid inferoseptal segment, and apical inferior segment are hypokinetic), G2DD, severely elevated PASP, dilated IVC, swirling apical contrast is seen  indicative of slow flow but no LV thrombus.    Assessment & Plan    1. Acute hypoxic respiratory failure with multiple medical issues - was diagnosed with multifocal PNA but also driven by component of acute HFrEF -> improved oxygenation with abx/diuresis - additional issues include lactic acidosis, subdural hemorrhage with fall, recent PAD/infection issues, anemia, thrombocytopenia - lumbar CT yesterday with compression fx and spinal stenosis - ongoing IM/palliative follow-up   2. 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 Driven by hypoxia with PNA, CHF, also former tobacco abuse so cannot exclude some component of underlying pulm disease - given comorbidities and acute illness would hold off RHC and broad extensive workup for now - outpatient f/u, can consider repeat echocardiogram after follow-up to reassess when maintaining euvolemia   4. Acute encephalopathy - patient politely says he is unable to remember where he is or what the date is; remote notes in chart indicate some degree of underlying dementia - will need clear disposition at discharge since he lived by himself   5. Remote CAD s/p stenting - denies recent angina though would be concerned for progression of obstructive coronary disease given decline in LVEF - given subdural hemorrhage and mentation, not a candidate for invasive evaluation at present time (ASA stopped), follow conservatively with symptom-based approach for now - per IM notes,  statin discontinued due to daughter request   6. Suspected CKD 3a - recent Cr 1.2-1.6 in 10/2021, stable today   7. PAD - OP f/u [facility]  Tentatively scheduled f/u 9/25 w/ me in [place]. Pt lives in  [place].  For questions or updates, please contact [place] Please consult [email or web address] for contact info under Cardiology/STEMI.  Signed, Charlie Pitter, PA-C 12/03/2021, 8:27 AM    Attending note Patient seen and discussed with PA Dunn, I agree with her documentation.    1.Acute HFrEF - 11/2021 echo: LVE 35% grade II dd, normal RV function, severe pulm HTN PASP 72 - CXR +HF, mod pulm edema - neg 810 mL yesterday and 4.6L since admit. Reds vest data pending, will check with nursing why not completed. Receivd IV lasix 40mg  x 1 yesterday, overall stable renal function. Due for IV lasix 40mg  x 1 this AM, would change to oral 40mg  daily starting tomorrow. Repeat CXR.    - medical therapy with losartan 50 (lisinopril dose 12/01/21). Change losartan to entresto 49/51mg  bid.On toprol 12.5mg  daily, add aldactone 12.5mg  daily and farxiga 10mg  daily.  - advanced age and comorbidities with mild subdural bleed, no plans for ischemic testing at this time.      2. CAD - history of remote stenting - no acute issues this admission   3. Pulmonary HTN - PASP severely elevated at 72 by echo in setting of volume overload, left sided HF as well as pneumonia and hypoxia. Suspect large component is group II pulm HTN. Consider repeat echo over several weeks when he is euvolemic to reassess pressures. Discussions about outpatient palliative care, I don't see strong benefit in a broad extensive pulmonary HTN workup at this time.    3. SDH - 11/28/21 CT trace subdural blood       4. Pneumonia - per primary team     5. Encephalopathy - per primary team  Carlyle Dolly MD

## 2021-12-03 NOTE — Progress Notes (Signed)
PROGRESS NOTE  Quantez Mazanec [id number] DOB: 1940-04-27 DOA: 11/28/2021 PCP: Patient, No Pcp Per  Brief History:  81 year old gentleman with type 2 diabetes mellitus, coronary artery disease status post stenting, hypertension, peripheral vascular disease, peripheral neuropathy, recent right second toe phalanx fracture, cellulitis right foot and leg, former smoker recently discharged from [facility] after being hospitalized for right foot cellulitis, metabolic encephalopathy.  He reports that he had been doing fairly well at home.  He lives alone but has hired help and close family members.  He reports that he was trying to walk up the stairs he believes yesterday when he apparently tripped and fell.  He was down on the floor for an unknown period of time and was not able to get up.  Apparently his girlfriend found him on the floor and called EMS.  Patient was noted to be confused and complained of shortness of breath.  He was hypoxic and his blood sugar was elevated.  His pulse ox was 85% on room air.  He had a small abrasion to the right elbow.  Patient was unsure if he hit his head however he was noted to have a bruise on the scalp.  His CT scan revealed minimal left parafalcine subdural hemorrhage.  Neurosurgery was consulted and they recommended repeat CT in 6 hours but patient can remain at [facility].  He also had a CT of the right hip that did not show acute fracture.  He was noted to have a multifocal pneumonia on chest x-ray.  He has a new oxygen requirement and he was started on steroids and antibiotics.  Admission was requested for further management.     Assessment and Plan: * Acute respiratory failure with hypoxia (HCC) - Secondary to CHF>> multifocal pneumonia -- initially on IV abx>>po doxy -Status post IV Solu-Medrol x1 dose given in ED -initially on supplemental oxygen -Continue bronchodilators as ordered - now stable on RA  Acute HFrEF (heart failure  with reduced ejection fraction) ([place]) 11/29/21 Echo--EF 35%, G2DD, PASP 71,6, trivial MR, mild TR neg 2.3 L on 9/5 and 3.8 L since admit. Reds vest data pending. Received IV lasix 20mg  x 2 on 9/5, due for 40mg  x 1 today. Renal function is stable --continue IV lasix --appreciate cardiology>>added metoprolol succinate, continue losartan --plan start Entresto 9/7 --plan SLGT2 when able  Subdural hematoma [facility]) - Neurosurgery was consulted and recommended repeat CT scan in 6 hours which has been completed and hemorrhage remains minimal and stable with no enlargement -- we have stopped aspirin for now and not using any anticoagulants   Multifocal pneumonia - Broad-spectrum antibiotic coverage initially but as he has clinically improved we are de-escalating antibiotics --cefepime 9/2>>9/5 --doxy 9/5>> --cefinir 9/6>> --PCT 0.25>>0.19  Fall at home - Remains high fall risk, PT evaluation requested, fall precautions ordered -PT recommends HHPT -CT Lumbar spine--L1 compression fracture with 25% height loss - no red flags on examination  Lumbar compression fracture ([place]) 9/6 CT L-spine--L1 compression fx with 25% height loss; 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 Jamelle Haring with vascular surgery on 11/13/21.  He was taken to OR to optimize vascular flow.  Pt has disadvantaged pedal circulation. He has an occluded anterior tibial artery.  -- Dr. Stanford Breed reported that patient  had been optimized from a vascular standpoint.  He recommended local care to toe only.  He did not recommend amputation.  If patient has deterioration, he would consider retrograde posterior tibial access.  Dr. Stanford Breed recommended aspirin 81 mg daily and high intensity statin.  Unfortunately daughter says she did not give statin to patient because  she does not feel it is safe for him and she does NOT want a statin given to him in hospital and so I had to discontinue the drug.  --overall toe stable with small area of dry eschar on dorsum --restart ASA when neurosurgery clears patient  Constipation -- added dulcolax suppository prn -- added nightly peri-colace  -- RN reported large BM 9/5  Diabetic neuropathy ([place]) - Resume home gabapentin  PAD (peripheral artery disease) ([place]) - He was evaluated by vascular surgery at [facility] during recent hospitalization and they did everything they could to optimize vascular flow to his right foot.   - Dr. Stanford Breed recommended aspirin 81 mg daily and high intensity statin treatment.  He did not recommend amputation of toe but local care and close monitoring.   B12 deficiency anemia - Hemoglobin stable from recent testing  B12 deficiency - Follow-up B12 level: 515 -Pt received supplementation during the recent hospitalization at [facility]  Lactic acidosis - likely secondary to hypoxia, hydrated with IV fluid boluses in the ED  Failure to thrive in adult - Precipitous decline in recent weeks, requesting palliative medicine consultation for further goals of care discussions  Dyslipidemia - Discussed with daughter she decided that she did not want her father taking a statin medication so she has not been giving him atorvastatin and requested that it not be given to him in the hospital  CAD S/P percutaneous coronary angioplasty - Resumed home cardiac medications  Type 2 diabetes mellitus with peripheral vascular disease ([place]) - Continue SSI coverage and frequent CBG monitoring, hold home glipizide -11/12/21 A1C-9.4 -continue ISS -plan to d/c with glipizide and linagliptin -outpatient follow up with PCP  Cellulitis of right foot - He has been on antibiotics for this and he is followed closely by his podiatrist for this.  We are temporarily holding his oral antibiotics while he is on IV  antibiotics for pneumonia.  Continue local wound care. -- the cellulitis seems to have resolved now        Family Communication:   daughter updated 9/7  Consultants:  cardiology  Code Status:  DNR  DVT Prophylaxis:  SCDs   Procedures: As Listed in Progress Note Above  Antibiotics: Cefepime 9/2>>9/5 Doxy 9/5>>      Subjective: Patient denies fevers, chills, headache, chest pain, dyspnea, nausea, vomiting, diarrhea, abdominal pain, dysuria, hematuria, hematochezia, and melena.   Objective: Vitals:   12/02/21 1217 12/02/21 2142 12/03/21 0422 12/03/21 0451  BP: (!) 152/79 128/68 (!) 167/84   Pulse: 72 63 77   Resp: 18 16 16    Temp: 98.4 F (36.9 C) 98.2 F (36.8 C) 97.8 F (36.6 C)   TempSrc:  Oral Oral   SpO2: 97% 97% 96%   Weight:    84.2 kg  Height:        Intake/Output Summary (Last 24 hours) at 12/03/2021 0725 Last data filed at 12/03/2021 0659 Gross per 24 hour  Intake 840 ml  Output 1650 ml  Net -810 ml   Weight change: -0.1 kg Exam:  General:  Pt is alert, follows commands appropriately, not in acute distress HEENT: No icterus, No thrush, No  neck mass, [place]/AT Cardiovascular: RRR, S1/S2, no rubs, no gallops Respiratory: bibasilar rales. No wheeze Abdomen: Soft/+BS, non tender, non distended, no guarding Extremities: No edema, No lymphangitis, No petechiae, No rashes, no synovitis;  dry eschar dorsum right 2nd toe--motor in tact   Data Reviewed: I have personally reviewed following labs and imaging studies Basic Metabolic Panel: Recent Labs  Lab 11/29/21 0328 11/30/21 0401 12/01/21 0433 12/02/21 0425 12/03/21 0419  NA 138 136 138 137 139  K 4.1 4.2 4.4 3.8 3.9  CL 111 111 111 108 110  CO2 18* 19* 23 24 23   GLUCOSE 263* 90 156* 138* 90  BUN 27* 32* 31* 29* 32*  CREATININE 1.17 1.21 1.20 1.20 1.13  CALCIUM 8.1* 7.9* 8.4* 8.2* 8.4*  MG 2.1  --  2.3 2.2  --    Liver Function Tests: Recent Labs  Lab 11/28/21 0925  AST 26  ALT 23   ALKPHOS 248*  BILITOT 1.1  PROT 7.0  ALBUMIN 3.3*   No results for input(s): "LIPASE", "AMYLASE" in the last 168 hours. No results for input(s): "AMMONIA" in the last 168 hours. Coagulation Profile: Recent Labs  Lab 11/28/21 0925  INR 1.2   CBC: Recent Labs  Lab 11/28/21 0925 11/29/21 0328 12/02/21 0425 12/03/21 0419  WBC 10.8* 9.9 6.1 6.5  NEUTROABS 8.7* 8.6*  --   --   HGB 11.4* 10.2* 9.8* 10.5*  HCT 36.2* 32.5* 30.6* 32.9*  MCV 94.0 92.9 92.7 93.2  PLT 197 169 138* 139*   Cardiac Enzymes: Recent Labs  Lab 11/28/21 0925  CKTOTAL 66   BNP: Invalid input(s): "POCBNP" CBG: Recent Labs  Lab 12/01/21 2005 12/02/21 0419 12/02/21 0729 12/02/21 1110 12/02/21 1659  GLUCAP 197* 135* 138* 183* 127*   HbA1C: No results for input(s): "HGBA1C" in the last 72 hours. Urine analysis:    Component Value Date/Time   COLORURINE YELLOW 11/28/2021 0949   APPEARANCEUR CLEAR 11/28/2021 0949   LABSPEC 1.022 11/28/2021 0949   PHURINE 5.0 11/28/2021 0949   GLUCOSEU >=500 (A) 11/28/2021 0949   HGBUR SMALL (A) 11/28/2021 0949   BILIRUBINUR NEGATIVE 11/28/2021 0949   KETONESUR 20 (A) 11/28/2021 0949   PROTEINUR 100 (A) 11/28/2021 0949   UROBILINOGEN 0.2 06/13/2012 0325   NITRITE NEGATIVE 11/28/2021 0949   LEUKOCYTESUR NEGATIVE 11/28/2021 0949   Sepsis Labs: @LABRCNTIP (procalcitonin:4,lacticidven:4) ) Recent Results (from the past 240 hour(s))  Blood culture (routine x 2)     Status: None (Preliminary result)   Collection Time: 11/28/21  9:08 AM   Specimen: Right Antecubital; Blood  Result Value Ref Range Status   Specimen Description   Final    RIGHT ANTECUBITAL BOTTLES DRAWN AEROBIC AND ANAEROBIC   Special Requests   Final    Blood Culture results may not be optimal due to an excessive volume of blood received in culture bottles   Culture   Final    NO GROWTH 4 DAYS Performed at [facility], [address] [place], [place] [address]    Report Status PENDING   Incomplete  Blood culture (routine x 2)     Status: None (Preliminary result)   Collection Time: 11/28/21  9:20 AM   Specimen: Left Antecubital; Blood  Result Value Ref Range Status   Specimen Description   Final    LEFT ANTECUBITAL BOTTLES DRAWN AEROBIC AND ANAEROBIC   Special Requests Blood Culture adequate volume  Final   Culture   Final    NO GROWTH 4 DAYS Performed at [facility], [address] [place], [place] [address]    Report Status PENDING  Incomplete  Urine Culture     Status: None   Collection Time: 11/28/21  9:49 AM   Specimen: In/Out Cath Urine  Result Value Ref Range Status   Specimen Description   Final    IN/OUT CATH URINE Performed at [facility], [address], [place], [place] [address]    Special Requests   Final    NONE Performed at [facility], [address] [place], [place] [address]    Culture   Final    NO GROWTH Performed at [facility], [place] [address], [place], [place] [address]    Report Status 12/01/2021 FINAL  Final  Resp Panel by RT-PCR (Flu A&B, Covid) Anterior Nasal Swab     Status: None   Collection Time: 11/28/21 10:37 AM   Specimen: Anterior Nasal Swab  Result Value Ref Range Status   SARS Coronavirus 2 by RT PCR NEGATIVE NEGATIVE Final    Comment: (NOTE) SARS-CoV-2 target nucleic acids are NOT DETECTED.  The SARS-CoV-2 RNA is generally detectable in upper respiratory specimens during the acute phase of infection. The lowest concentration of SARS-CoV-2 viral copies this assay can detect is 138 copies/mL. A negative result does not preclude SARS-Cov-2 infection and should not be used as the sole basis for treatment or other patient management decisions. A negative result may occur with  improper specimen collection/handling, submission of specimen other than nasopharyngeal swab, presence of viral mutation(s) within the areas targeted by this assay, and inadequate number of viral copies(<138 copies/mL). A negative result  must be combined with clinical observations, patient history, and epidemiological information. The expected result is Negative.  Fact Sheet for Patients:  EntrepreneurPulse.com.au  Fact Sheet for Healthcare Providers:  IncredibleEmployment.be  This test is no t yet approved or cleared by the [place] FDA and  has been authorized for detection and/or diagnosis of SARS-CoV-2 by FDA under an Emergency Use Authorization (EUA). This EUA will remain  in effect (meaning this test can be used) for the duration of the COVID-19 declaration under Section 564(b)(1) of the Act, 21 U.S.C.section 360bbb-3(b)(1), unless the authorization is terminated  or revoked sooner.       Influenza A by PCR NEGATIVE NEGATIVE Final   Influenza B by PCR NEGATIVE NEGATIVE Final    Comment: (NOTE) The Xpert Xpress SARS-CoV-2/FLU/RSV plus assay is intended as an aid in the diagnosis of influenza from Nasopharyngeal swab specimens and should not be used as a sole basis for treatment. Nasal washings and aspirates are unacceptable for Xpert Xpress SARS-CoV-2/FLU/RSV testing.  Fact Sheet for Patients: EntrepreneurPulse.com.au  Fact Sheet for Healthcare Providers: IncredibleEmployment.be  This test is not yet approved or cleared by the [place] FDA and has been authorized for detection and/or diagnosis of SARS-CoV-2 by FDA under an Emergency Use Authorization (EUA). This EUA will remain in effect (meaning this test can be used) for the duration of the COVID-19 declaration under Section 564(b)(1) of the Act, 21 U.S.C. section 360bbb-3(b)(1), unless the authorization is terminated or revoked.  Performed at [facility], [address], [place], [place] [address]   MRSA Next Gen by PCR, Nasal     Status: None   Collection Time: 11/28/21  1:01 PM   Specimen: Nasal Mucosa; Nasal Swab  Result Value Ref Range Status   MRSA by PCR Next  Gen NOT DETECTED NOT DETECTED Final    Comment: (NOTE) The GeneXpert MRSA Assay (  FDA approved for NASAL specimens only), is one component of a comprehensive MRSA colonization surveillance program. It is not intended to diagnose MRSA infection nor to guide or monitor treatment for MRSA infections. Test performance is not FDA approved in patients less than 77 years old. Performed at [facility], [address] [place], [place] [address]      Scheduled Meds:  acetaminophen  1,000 mg Oral Q6H   Chlorhexidine Gluconate Cloth  6 each Topical Daily   doxycycline  100 mg Oral Q12H   furosemide  40 mg Intravenous Daily   guaiFENesin  600 mg Oral BID   insulin aspart  0-9 Units Subcutaneous TID WC   insulin aspart  3 Units Subcutaneous TID WC   linagliptin  5 mg Oral Daily   losartan  50 mg Oral Daily   metoprolol succinate  12.5 mg Oral Daily   nitroGLYCERIN  0.2 mg Transdermal Daily   senna-docusate  1 tablet Oral QHS   Continuous Infusions:  Procedures/Studies: CT LUMBAR SPINE WO CONTRAST  Result Date: 12/02/2021 CLINICAL DATA:  Low back pain, increased fracture risk. Fall, low back pain, suspect compression fracture. EXAM: CT LUMBAR SPINE WITHOUT CONTRAST TECHNIQUE: Multidetector CT imaging of the lumbar spine was performed without intravenous contrast administration. Multiplanar CT image reconstructions were also generated. RADIATION DOSE REDUCTION: This exam was performed according to the departmental dose-optimization program which includes automated exposure control, adjustment of the mA and/or kV according to patient size and/or use of iterative reconstruction technique. COMPARISON:  Lumbar spine radiographs 02/27/2009 FINDINGS: Segmentation: 5 lumbar type vertebrae. Alignment: Normal. Vertebrae: Horizontal fracture of the inferior aspect of the L1 vertebral body involving the inferior endplate with [id number] vertebral body height loss. Fracture cleft along the inferior endplate containing a  small amount of gas. Small age indeterminate Schmorl's node type deformity of the L1 superior endplate. No retropulsion of the posterior L1 vertebral body or posterior element fracture. Paraspinal and other soft tissues: Mild paravertebral soft tissue edema at L1. Atherosclerotic vascular calcifications including in the renal hila. Disc levels: Mild disc space narrowing at L3-4. Disc bulging and posterior element hypertrophy result in mild spinal stenosis at L3-4 and moderate to severe spinal stenosis at L4-5. Mild bilateral neural foraminal stenosis at L4-5 and L5-S1. IMPRESSION: 1. Acute L1 inferior endplate compression fracture with 25% vertebral body height loss. 2. 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 3. Aortic Atherosclerosis (ICD10-I70.0). Electronically Signed   By: Logan Bores M.D.   On: 12/02/2021 16:28   DG CHEST PORT 1 VIEW  Result Date: 12/01/2021 CLINICAL DATA:  [id number]. Admitted with CHF and multifocal pneumonia. Recent history of cellulitis lower extremity and metabolic encephalopathy. EXAM: PORTABLE CHEST 1 VIEW COMPARISON:  Portable chest 11/28/2021 FINDINGS: 4:51 a.m. Mild cardiomegaly. There is increased perihilar vascular congestion and moderate interstitial pulmonary edema with a basal gradient There are increased perihilar opacities radiating into the lower lung fields most likely indicating alveolar edema, with underlying pneumonia not excluded. Left lower lobe consolidation continues to be seen in the retrocardiac space and there are moderate increased pleural effusions. The upper 1/3 of the lungs remain clear of focal opacities but there is increased apical interstitial edema. There is aortic atherosclerosis with stable mediastinum. Degenerative change thoracic spine. IMPRESSION: 1. Worsening CHF pattern with increased central vascular prominence and moderate pulmonary edema. 2. Perihilar opacities most likely from alveolar edema, underlying  pneumonia not excluded. 3. Persistent left lower lobe consolidation with increasing bilateral  moderate pleural effusions. 4. Aortic atherosclerosis. Electronically Signed   By: Almira Bar M.D.   On: 12/01/2021 06:54   ECHOCARDIOGRAM COMPLETE  Result Date: 11/29/2021    ECHOCARDIOGRAM REPORT   Patient Name:   AMARE BAIL Date of Exam: 11/29/2021 Medical Rec #:  [id number]         Height:       72.0 in Accession #:    [id number]        Weight:       198.0 lb Date of Birth:  1940/12/01        BSA:          2.121 m Patient Age:    80 years          BP:           136/61 mmHg Patient Gender: M                 HR:           81 bpm. [facility] Procedure: 2D Echo, Cardiac Doppler and Color Doppler Indications:    CAD Native Vessel I25.10, Congestive Heart Failure I50.9  History:        Patient has no prior history of Echocardiogram examinations.                 CAD; Risk Factors:Dyslipidemia, Diabetes and Hypertension. Hx                 S/P percutaneous coronary angioplasty.  Sonographer:    Celesta Gentile RCS Referring Phys: [phone] CLANFORD L JOHNSON IMPRESSIONS  1. Left ventricular ejection fraction, by estimation, is 35%. The left ventricle has moderately decreased function. The left ventricle demonstrates regional wall motion abnormalities (see scoring diagram/findings for description). Left ventricular diastolic parameters are consistent with Grade II diastolic dysfunction (pseudonormalization).  2. Right ventricular systolic function is normal. The right ventricular size is normal. There is severely elevated pulmonary artery systolic pressure. The estimated right ventricular systolic pressure is 71.6 mmHg.  3. Left atrial size was mildly dilated.  4. The mitral valve is grossly normal. Trivial mitral valve regurgitation. No evidence of mitral stenosis.  5. The aortic valve is normal in structure. There is mild calcification of the aortic valve. Aortic valve regurgitation is not visualized. No  aortic stenosis is present.  6. The inferior vena cava is dilated in size with <50% respiratory variability, suggesting right atrial pressure of 15 mmHg. Conclusion(s)/Recommendation(s): No left ventricular mural or apical thrombus/thrombi. Swirling apical contrast is seen indicative of slow flow. FINDINGS  Left Ventricle: Left ventricular ejection fraction, by estimation, is 35%. The left ventricle has moderately decreased function. The left ventricle demonstrates regional wall motion abnormalities. Definity contrast agent was given IV to delineate the left ventricular endocardial borders. The left ventricular internal cavity size was normal in size. There is no left ventricular hypertrophy. Left ventricular diastolic parameters are consistent with Grade II diastolic dysfunction (pseudonormalization).  LV Wall Scoring: The mid and distal anterior wall, mid and distal lateral wall, mid and distal anterior septum, mid anterolateral segment, mid inferoseptal segment, and apical inferior segment are hypokinetic. Right Ventricle: The right ventricular size is normal. No increase in right ventricular wall thickness. Right ventricular systolic function is normal. There is severely elevated pulmonary artery systolic pressure. The tricuspid regurgitant velocity is 3.76 m/s, and with an assumed right atrial pressure of 15 mmHg, the estimated right ventricular systolic pressure is 71.6 mmHg. Left Atrium: Left atrial size was  mildly dilated. Right Atrium: Right atrial size was normal in size. Pericardium: Trivial pericardial effusion is present. Mitral Valve: The mitral valve is grossly normal. Trivial mitral valve regurgitation. No evidence of mitral valve stenosis. Tricuspid Valve: The tricuspid valve is normal in structure. Tricuspid valve regurgitation is mild . No evidence of tricuspid stenosis. Aortic Valve: The aortic valve is normal in structure. There is mild calcification of the aortic valve. Aortic valve  regurgitation is not visualized. No aortic stenosis is present. Pulmonic Valve: The pulmonic valve was normal in structure. Pulmonic valve regurgitation is trivial. No evidence of pulmonic stenosis. Aorta: The aortic root is normal in size and structure. Ascending aorta measurements are within normal limits for age when indexed to body surface area. Venous: The inferior vena cava is dilated in size with less than 50% respiratory variability, suggesting right atrial pressure of 15 mmHg. IAS/Shunts: No atrial level shunt detected by color flow Doppler.  LEFT VENTRICLE PLAX 2D LVIDd:         4.80 cm   Diastology LVIDs:         3.70 cm   LV e' medial:    6.31 cm/s LV PW:         1.10 cm   LV E/e' medial:  16.6 LV IVS:        1.20 cm   LV e' lateral:   6.96 cm/s LVOT diam:     2.20 cm   LV E/e' lateral: 15.1 LV SV:         63 LV SV Index:   30 LVOT Area:     3.80 cm  RIGHT VENTRICLE RV S prime:     14.80 cm/s TAPSE (M-mode): 2.5 cm LEFT ATRIUM             Index        RIGHT ATRIUM           Index LA diam:        4.40 cm 2.07 cm/m   RA Area:     17.20 cm LA Vol (A2C):   68.6 ml 32.34 ml/m  RA Volume:   48.90 ml  23.05 ml/m LA Vol (A4C):   82.8 ml 39.03 ml/m LA Biplane Vol: 76.4 ml 36.01 ml/m  AORTIC VALVE LVOT Vmax:   70.50 cm/s LVOT Vmean:  55.400 cm/s LVOT VTI:    0.165 m  AORTA Ao Root diam: 3.90 cm MITRAL VALVE                TRICUSPID VALVE MV Area (PHT): 5.66 cm     TR Peak grad:   56.6 mmHg MV Decel Time: 134 msec     TR Vmax:        376.00 cm/s MV E velocity: 105.00 cm/s MV A velocity: 62.10 cm/s   SHUNTS MV E/A ratio:  1.69         Systemic VTI:  0.16 m                             Systemic Diam: 2.20 cm Cherlynn Kaiser MD Electronically signed by Cherlynn Kaiser MD Signature Date/Time: 11/29/2021/11:51:57 AM    Final    CT HEAD WO CONTRAST  Result Date: 11/28/2021 CLINICAL DATA:  Follow-up subdural hematoma EXAM: CT HEAD WITHOUT CONTRAST TECHNIQUE: Contiguous axial images were obtained from the base of  the skull through the vertex without intravenous contrast. RADIATION DOSE REDUCTION: This exam was performed according to  the departmental dose-optimization program which includes automated exposure control, adjustment of the mA and/or kV according to patient size and/or use of iterative reconstruction technique. COMPARISON:  11/28/2021, 10:17 a.m. FINDINGS: Brain: Unchanged, minimal left parafalcine subdural hemorrhage (series 4, image 22). No evidence of acute infarction, new hemorrhage, hydrocephalus, new extra-axial collection or mass lesion/mass effect. Vascular: No hyperdense vessel or unexpected calcification. Skull: Normal. Negative for fracture or focal lesion. Sinuses/Orbits: No acute finding. Other: Large soft tissue contusion of the left scalp vertex (series 4, image 15). IMPRESSION: 1. Unchanged, minimal left parafalcine subdural hemorrhage. No new or enlarging hemorrhage. 2. Large soft tissue contusion of the left scalp vertex. Electronically Signed   By: Delanna Ahmadi M.D.   On: 11/28/2021 16:15   CT Head Wo Contrast  Addendum Date: 11/28/2021   ADDENDUM REPORT: 11/28/2021 10:42 ADDENDUM: Study discussed by telephone with PA TAMMY TRIPLETT on 11/28/2021 at 1037 hours. Electronically Signed   By: Genevie Ann M.D.   On: 11/28/2021 10:42   Result Date: 11/28/2021 CLINICAL DATA:  81 year old male found down. EXAM: CT HEAD WITHOUT CONTRAST TECHNIQUE: Contiguous axial images were obtained from the base of the skull through the vertex without intravenous contrast. RADIATION DOSE REDUCTION: This exam was performed according to the departmental dose-optimization program which includes automated exposure control, adjustment of the mA and/or kV according to patient size and/or use of iterative reconstruction technique. COMPARISON:  Head CT 11/12/2021. FINDINGS: Brain: Trace anterior para falcine subdural blood suspected on series 3, image 18. No mass effect. No other acute intracranial hemorrhage identified. No  midline shift, ventriculomegaly, mass effect, evidence of mass lesion, or evidence of cortically based acute infarction. No IVH. Stable gray-white matter differentiation throughout the brain. Vascular: Calcified atherosclerosis at the skull base. No suspicious intracranial vascular hyperdensity. Skull: Stable.  No acute osseous abnormality identified. Sinuses/Orbits: Visualized paranasal sinuses and mastoids are stable and well aerated. Other: Broad-based new left posterior convexity scalp hematoma. Underlying calvarium appears stable and intact. Stable orbits soft tissues. Calcified scalp vessel atherosclerosis. IMPRESSION: 1. Trace anterior para-falcine subdural blood no mass effect. No other acute intracranial abnormality. 2. Acute left posterior convexity scalp hematoma without underlying skull fracture. Electronically Signed: By: Genevie Ann M.D. On: 11/28/2021 10:34   CT Hip Right Wo Contrast  Result Date: 11/28/2021 CLINICAL DATA:  Patient BIB by [facility] after girlfriend to home this am to find patient on floor. Patient reports that he fell walking up steps around 0730 however, EMS believes that patient may have fallen overnight. Patient is confused on arrival. Found on second story of home after he crawled to couch. Complaining of right hip/lower back pain from fall. EXAM: CT OF THE RIGHT HIP WITHOUT CONTRAST TECHNIQUE: Multidetector CT imaging of the right hip was performed according to the standard protocol. Multiplanar CT image reconstructions were also generated. RADIATION DOSE REDUCTION: This exam was performed according to the departmental dose-optimization program which includes automated exposure control, adjustment of the mA and/or kV according to patient size and/or use of iterative reconstruction technique. COMPARISON:  None Available. FINDINGS: Bones/Joint/Cartilage No fracture.  No bone lesion. Superolateral right hip joint space narrowing. Small marginal spurs from the base of the right femoral  head and bony prominence from the superior right acetabulum, findings consistent with osteoarthritis. No convincing joint effusion. Ligaments Suboptimally assessed by CT. Muscles and Tendons No evidence of a muscle hematoma/injury. Tendons are grossly intact. Soft tissues No acute findings. IMPRESSION: 1. No fracture or acute finding. 2. Mild right hip  joint osteoarthritis. Electronically Signed   By: Amie Portland M.D.   On: 11/28/2021 10:39   DG Chest Port 1 View  Result Date: 11/28/2021 CLINICAL DATA:  Patient found on the floor this morning. Patient reports that he fell walking up steps this morning. EXAM: PORTABLE CHEST 1 VIEW COMPARISON:  11/11/2021. FINDINGS: Cardiac silhouette is normal in size. No mediastinal or hilar masses. Thickened bilateral interstitial markings. Mild hazy airspace opacity projects in the mid to lower lungs. Small pleural effusions suspected. No pneumothorax. Skeletal structures are grossly intact. IMPRESSION: 1. Interstitial thickening with hazy lung opacities and probable small effusions. Suspect pulmonary edema, with multifocal pneumonia in the differential diagnosis. Electronically Signed   By: Amie Portland M.D.   On: 11/28/2021 10:35   PERIPHERAL VASCULAR CATHETERIZATION  Result Date: 11/16/2021 DATE OF SERVICE: 11/13/2021  PATIENT:  [name]  81 y.o. 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 [name], MD  ASSISTANT: none  ANESTHESIA:   local  ESTIMATED BLOOD LOSS: minimal  LOCAL MEDICATIONS USED:  LIDOCAINE  COUNTS: confirmed correct.  PATIENT DISPOSITION:  PACU - hemodynamically stable.  Delay start of Pharmacological VTE agent (>24hrs) due to surgical blood loss or  risk of bleeding: no  INDICATION FOR PROCEDURE: Khyri Barco is a 81 y.o. male with right foot ulcer with peripheral arterial disease. After careful discussion of risks, benefits, and alternatives the patient was offered angiography. The patient understood and wished to proceed.  OPERATIVE FINDINGS: Terminal aorta and iliac arteries: Widely patent without flow-limiting stenosis  Right lower extremity: Common femoral artery: Widely patent without flow-limiting stenosis Profunda femoris artery: Widely patent without flow-limiting stenosis Superficial femoral artery: Diffusely diseased.  No stenosis greater than 50%. Popliteal artery: Diffusely diseased.  Widely patent. Anterior tibial artery: Occluded Tibioperoneal trunk: Widely patent Peroneal artery: Widely patent to the ankle.  Arborization to the PT, filling the foot. Posterior tibial artery: Patent at its origin.  Patent about the ankle.  Fills the pedal circulation. Pedal circulation: Disadvantaged.  Fills only through PT.  Small vessel disease noted.  GLASS score.  Not applicable.  Inline flow to the ankle.  Wifi score 1 / 1 / 2.  Clinical stage III.  DESCRIPTION OF PROCEDURE: After identification of the patient in the pre-operative holding area, the patient was transferred to the operating room. The patient was positioned supine on the operating room table. The groins were prepped and draped in standard fashion. A surgical pause was performed confirming correct patient, procedure, and operative location.  The left groin was anesthetized with subcutaneous injection of 1% lidocaine. Using ultrasound guidance, the left common femoral artery was accessed with micropuncture technique. Fluoroscopy was used to confirm cannulation over the femoral head. The 22F sheath was upsized to 53F.  A Benson wire was advanced into the distal aorta. Over the wire an omni flush catheter was advanced to the level of L2. Aortogram was performed - see above for details.  The right  common iliac artery was selected with an omniflush catheter and Bentson guidewire. The wire was advanced into the common femoral artery. Over the wire the omni flush catheter was advanced into the external iliac artery. Selective angiography was performed - see above for details.  The decision was  made to intervene. The patient was heparinized with 8000 units of heparin. The 77F sheath was exchanged for a 26F x 45cm sheath. Selective angiography of the left lower extremity was performed prior to intervention.  I tried to recanalize the posterior tibial artery with a variety of wire and catheter exchanges.  I was unable to cross the lesion antegrade.  I elected to stop here.  All endovascular equipment was then removed.  A minx device was used to close the arteriotomy. Hemostasis was excellent upon completion.  Upon completion of the case instrument and sharps counts were confirmed correct. The patient was transferred to the PACU in good condition. I was present for all portions of the procedure.  PLAN: ASA 81mg  PO QD. High intensity statin therapy.  Inline flow to the ankle.  Patient optimized from a vascular standpoint.  Would recommend local care to the toe only.  Would not recommend amputation at this time.  We will follow closely clinically.  If he has deterioration, we could consider retrograde posterior tibial access.  . Rande Brunt, MD [facility] Phone Number: [phone] 11/13/2021 9:53 AM   VAS 11/15/2021 ABI WITH/WO TBI  Result Date: 11/12/2021  LOWER EXTREMITY DOPPLER STUDY Patient Name:  MEHDI GIRONDA  Date of Exam:   11/12/2021 Medical Rec #: 11/14/2021          Accession #:    [id number] Date of Birth: May 06, 1940         Patient Gender: M Patient Age:   42 years Exam Location:  [facility] Procedure:      VAS [facility] ABI WITH/WO TBI Referring Phys: [place] --------------------------------------------------------------------------------  Indications:  Pain in right toe. High Risk Factors: Diabetes.  Comparison Study: No previous exam noted. Performing Technologist: Midge Minium BS, RVT  Examination Guidelines: A complete evaluation includes at minimum, Doppler waveform signals and systolic blood pressure reading at the level of bilateral brachial, anterior tibial, and posterior tibial arteries, when vessel segments are accessible. Bilateral testing is considered an integral part of a complete examination. Photoelectric Plethysmograph (PPG) waveforms and toe systolic pressure readings are included as required and additional duplex testing as needed. Limited examinations for reoccurring indications may be performed as noted.  ABI Findings: +---------+------------------+-----+-------------------+--------+ Right    Rt Pressure (mmHg)IndexWaveform           Comment  +---------+------------------+-----+-------------------+--------+ Brachial 157                    triphasic                   +---------+------------------+-----+-------------------+--------+ PTA      255               1.60 dampened monophasic         +---------+------------------+-----+-------------------+--------+ DP       103               0.65 dampened monophasic         +---------+------------------+-----+-------------------+--------+ Great Toe49                0.31                             +---------+------------------+-----+-------------------+--------+ +---------+------------------+-----+--------------------+-------+ Left     Lt Pressure (mmHg)IndexWaveform            Comment +---------+------------------+-----+--------------------+-------+ Brachial 159  triphasic                   +---------+------------------+-----+--------------------+-------+ PTA      255               1.60 dampened monophasic         +---------+------------------+-----+--------------------+-------+ DP       255               1.60 Hyperemic/monophasic         +---------+------------------+-----+--------------------+-------+ Great Toe66                0.42                             +---------+------------------+-----+--------------------+-------+ +-------+-----------+-----------+------------+------------+ ABI/TBIToday's ABIToday's TBIPrevious ABIPrevious TBI +-------+-----------+-----------+------------+------------+ Right  [place]         0.31                                +-------+-----------+-----------+------------+------------+ Left   [place]         0.42                                +-------+-----------+-----------+------------+------------+  Summary: Right: Resting right ankle-brachial index indicates noncompressible right lower extremity arteries. The right toe-brachial index is abnormal. Left: Resting left ankle-brachial index indicates noncompressible left lower extremity arteries. The left toe-brachial index is abnormal. *See table(s) above for measurements and observations.  Electronically signed by Monica [name] MD on 11/12/2021 at 3:13:14 PM.    Final    MR FOOT RIGHT WO CONTRAST  Result Date: 11/12/2021 CLINICAL DATA:  Foot swelling, nondiabetic, osteomyelitis suspected EXAM: MRI OF THE RIGHT FOREFOOT WITHOUT CONTRAST TECHNIQUE: Multiplanar, multisequence MR imaging of the right forefoot was performed. No intravenous contrast was administered. COMPARISON:  Right foot radiograph 11/11/2021 FINDINGS: Bones/Joint/Cartilage Nondisplaced fracture at the base of the second toe proximal phalanx, which may be subacute. Minimal marrow edema in the proximal phalanx. No other significant marrow signal alteration. Ligaments Intact Lisfranc ligament.  Intact MTP collateral ligaments. Muscles and Tendons No acute tendon tear. Diffuse intramuscular edema and atrophy in the foot as is commonly seen in diabetics. Soft tissues Generalized soft tissue swelling of the foot, with confluent edema along the second toe (series 4 images 8-15). No  focal fluid collection. There is an adjacent blister like skin lesion. IMPRESSION: Nondisplaced fracture at base of the second toe proximal phalanx, which may be subacute. Adjacent confluent edema and a blister-like skin lesion noted along the plantar aspect of the second toe noted. No evidence of soft tissue abscess or osteomyelitis. Electronically Signed   By: Maurine Simmering M.D.   On: 11/12/2021 10:05   CT HEAD WO CONTRAST (5MM)  Result Date: 11/12/2021 CLINICAL DATA:  81 year old male with altered mental status. EXAM: CT HEAD WITHOUT CONTRAST TECHNIQUE: Contiguous axial images were obtained from the base of the skull through the vertex without intravenous contrast. RADIATION DOSE REDUCTION: This exam was performed according to the departmental dose-optimization program which includes automated exposure control, adjustment of the mA and/or kV according to patient size and/or use of iterative reconstruction technique. COMPARISON:  CT cervical spine 02/27/2009. FINDINGS: Brain: Cerebral volume loss appears fairly generalized. No midline shift, ventriculomegaly, mass effect, evidence of mass lesion, intracranial hemorrhage or evidence of cortically based acute infarction. Gray-white matter differentiation is  within normal limits for age in both hemispheres. No cortical encephalomalacia identified. Vascular: Extensive Calcified atherosclerosis at the skull base. No suspicious intracranial vascular hyperdensity. Skull: No acute osseous abnormality identified. Sinuses/Orbits: Visualized paranasal sinuses and mastoids are well aerated. Other: No acute orbit or scalp soft tissue finding. Fairly extensive calcified scalp vessel atherosclerosis. IMPRESSION: 1. No acute intracranial abnormality. Unremarkable for age noncontrast CT appearance of the brain. 2. Advanced calcified atherosclerosis, including involvement of scalp vessels. This appearance can be seen with End stage renal disease. Electronically Signed   By: Genevie Ann M.D.   On: 11/12/2021 05:49   DG Foot Complete Right  Result Date: 11/11/2021 CLINICAL DATA:  Right foot wound infection. EXAM: RIGHT FOOT COMPLETE - 3+ VIEW COMPARISON:  None Available. FINDINGS: There is no evidence of fracture or dislocation. No lytic destruction is noted. Vascular calcifications are noted. Mild posterior calcaneal spurring is noted. IMPRESSION: No acute abnormality seen. Electronically Signed   By: Marijo Conception M.D.   On: 11/11/2021 12:35   DG Chest 2 View  Result Date: 11/11/2021 CLINICAL DATA:  Suspected sepsis EXAM: CHEST - 2 VIEW COMPARISON:  None FINDINGS: Heart is normal size. No confluent airspace opacities or effusions. Old healed left rib fracture. No acute bony abnormality. IMPRESSION: No active cardiopulmonary disease. Electronically Signed   By: Rolm Baptise M.D.   On: 11/11/2021 11:01    Orson Eva, DO  [facility]  If 7PM-7AM, please contact night-coverage [email or web address] Password [email or web address] 12/03/2021, 7:25 AM   LOS: 5 days

## 2021-12-03 NOTE — Discharge Summary (Signed)
Physician Discharge Summary   Patient: Wayne [name] MRN: [id number] DOB: [birth year]  Admit date:     11/28/2021  Discharge date: 12/04/21  Discharge Physician: Shanon Brow Drew Herman   PCP: Patient, No Pcp Per   Recommendations at discharge:   Please follow up with primary care provider within 1-2 weeks  Please repeat BMP and CBC in one week Paint right second toe with betadine once daily     Hospital Course: 81 year old gentleman with type 2 diabetes mellitus, coronary artery disease status post stenting, hypertension, peripheral vascular disease, peripheral neuropathy, recent right second toe phalanx fracture, cellulitis right foot and leg, former smoker recently discharged from [facility] after being hospitalized for right foot cellulitis, metabolic encephalopathy.  He reports that he had been doing fairly well at home.  He lives alone but has hired help and close family members.  He reports that he was trying to walk up the stairs he believes yesterday when he apparently tripped and fell.  He was down on the floor for an unknown period of time and was not able to get up.  Apparently his girlfriend found him on the floor and called EMS.  Patient was noted to be confused and complained of shortness of breath.  He was hypoxic and his blood sugar was elevated.  His pulse ox was 85% on room air.  He had a small abrasion to the right elbow.  Patient was unsure if he hit his head however he was noted to have a bruise on the scalp.  His CT scan revealed minimal left parafalcine subdural hemorrhage.  Neurosurgery was consulted and they recommended repeat CT in 6 hours but patient can remain at [facility].  He also had a CT of the right hip that did not show acute fracture.  He was noted to have a multifocal pneumonia on chest x-ray.  He has a new oxygen requirement and he was started on steroids and antibiotics.  Admission was requested for further management.  Assessment and Plan: * Acute  respiratory failure with hypoxia (HCC) - Secondary to CHF>> multifocal pneumonia -- initially on IV abx>>po doxy -Status post IV Solu-Medrol x1 dose given in ED -initially on supplemental oxygen -Continue bronchodilators as ordered - now stable on RA  Acute HFrEF (heart failure with reduced ejection fraction) ([place]) 11/29/21 Echo--EF 35%, G2DD, PASP 71,6, trivial MR, mild TR neg 2.3 L on 9/5 and 3.8 L since admit. Reds vest data pending. Received IV lasix 9m x 2 on 9/5, due for 4108mx 1 today. Renal function is stable --continue IV lasix>>po lasix on 9/8 --appreciate cardiology>>added metoprolol succinate, continue losartan --started Entresto 9/7 --started SLGT2  9/7 --spironolactone and metoprolol succinate started --outpt cardiology followup  [facility] and recommended repeat CT scan in 6 hours which has been completed and hemorrhage remains minimal and stable with no enlargement -- we have stopped aspirin for now and not using any anticoagulants   Multifocal pneumonia - Broad-spectrum antibiotic coverage initially but as he has clinically improved we are de-escalating antibiotics --cefepime 9/2>>9/5 --doxy 9/5>>9/8 --cefinir 9/6>>9/8 --PCT 0.25>>0.19 --finished course of antibiotics during hospitalization  Fall at home - Remains high fall risk, PT evaluation requested, fall precautions ordered -PT recommends HHPT -CT Lumbar spine--L1 compression fracture with 25% height loss - no red flags on examination  [place]bar compression fracture ([place]6 CT L-spine--L1 compression fx with 25% height loss; 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 Jamelle Haring with vascular surgery on 11/13/21.  He was taken to OR to optimize vascular  flow.  Pt has disadvantaged pedal circulation. He has an occluded anterior tibial artery.  -- Dr. Stanford Breed reported that patient had been optimized from a vascular standpoint.  He recommended local care to toe only.  He did not recommend amputation.  If patient has deterioration, he would consider retrograde posterior tibial access.  Dr. Stanford Breed recommended aspirin 81 mg daily and high intensity statin.  Unfortunately daughter says she did not give statin to patient because she does not feel it is safe for him and she does NOT want a statin given to him in hospital and so I had to discontinue the drug.  --overall toe stable with small area of dry eschar on dorsum --restart ASA when neurosurgery clears patient  Constipation -- added dulcolax suppository prn -- added nightly peri-colace  -- RN reported large BM 9/5  Diabetic neuropathy ([place]) - Resume home gabapentin  PAD (peripheral artery disease) ([place]) - He was evaluated by vascular surgery at [facility] during recent hospitalization and they did everything they could to optimize vascular flow to his right foot.   - Dr. Stanford Breed recommended aspirin 81 mg daily and high intensity statin treatment.  He did not recommend amputation of toe but local care and close monitoring.  -ASA 81 mg on hold until cleared by neurosurgery  B12 deficiency anemia - Hemoglobin stable from recent testing  B12 deficiency - Follow-up B12 level: 515 -Pt received supplementation during the recent hospitalization at [facility]  Lactic acidosis - likely secondary to hypoxia, hydrated with IV fluid boluses in the ED  Failure to thrive in adult - Precipitous decline in recent weeks, requesting palliative medicine consultation for further goals of care discussions - plan to go to SNF with palliative services  Dyslipidemia - Discussed with daughter she decided that she did not want her father taking a statin medication so she has not been giving him atorvastatin and  requested that it not be given to him in the hospital  CAD S/P percutaneous coronary angioplasty - Resumed home cardiac medications  Type 2 diabetes mellitus with peripheral vascular disease ([place]) - Continue SSI coverage and frequent CBG monitoring, hold home glipizide -11/12/21 A1C-9.4 -continue ISS -plan to d/c with glipizide and linagliptin -outpatient follow up with PCP  Cellulitis of right foot - He has been on antibiotics for this and he is followed closely by his podiatrist for this.  We are temporarily holding his oral antibiotics while he is on IV antibiotics for pneumonia.  Continue local wound care. -- the cellulitis seems to have resolved now         Pain control - [place] was reviewed. and patient was instructed, not to drive, operate heavy machinery, perform activities at heights, swimming or participation in water activities or provide baby-sitting services while on Pain, Sleep and Anxiety Medications; until their outpatient Physician has advised to do so again. Also recommended to not to take more than prescribed Pain, Sleep and Anxiety Medications.  Consultants: cardiology  Procedures performed: none  Disposition: Skilled nursing facility Diet recommendation:  Cardiac diet DISCHARGE MEDICATION: Allergies as of 12/04/2021   No Known Allergies      Medication List  STOP taking these medications    aspirin EC 81 MG tablet   atorvastatin 40 MG tablet Commonly known as: LIPITOR   cephALEXin 500 MG capsule Commonly known as: KEFLEX   ibuprofen 200 MG tablet Commonly known as: ADVIL   linagliptin 5 MG Tabs tablet Commonly known as: TRADJENTA   lisinopril 10 MG tablet Commonly known as: ZESTRIL       TAKE these medications    Accu-Chek Softclix Lancets lancets Use 4 times daily as directed to check blood sugars.   acetaminophen 500 MG tablet Commonly known as: TYLENOL Take 2 tablets (1,000  mg total) by mouth every 8 (eight) hours.   blood glucose meter kit and supplies Kit Dispense based on patient and insurance preference. Use up to four times daily as directed.   Accu-Chek Guide w/Device Kit Use up to four times daily as directed.   dapagliflozin propanediol 10 MG Tabs tablet Commonly known as: FARXIGA Take 1 tablet (10 mg total) by mouth daily.   furosemide 40 MG tablet Commonly known as: LASIX Take 1 tablet (40 mg total) by mouth daily.   glipiZIDE 5 MG tablet Commonly known as: GLUCOTROL Take 0.5 tablets (2.5 mg total) by mouth daily before breakfast.   glucose blood test strip Use as instructed   Accu-Chek Guide test strip Generic drug: glucose blood Use 4 times daily as directed to check bloos sugars.   metoprolol succinate 25 MG 24 hr tablet Commonly known as: TOPROL-XL Take 0.5 tablets (12.5 mg total) by mouth daily. Start taking on: December 05, 2021   nitroGLYCERIN 0.2 mg/hr patch Commonly known as: Nitro-Dur Place 1 patch (0.2 mg total) onto the skin daily.   oxyCODONE 5 MG immediate release tablet Commonly known as: Oxy IR/ROXICODONE Take 1 tablet (5 mg total) by mouth every 6 (six) hours as needed for severe pain.   sacubitril-valsartan 49-51 MG Commonly known as: ENTRESTO Take 1 tablet by mouth 2 (two) times daily.   spironolactone 25 MG tablet Commonly known as: ALDACTONE Take 0.5 tablets (12.5 mg total) by mouth daily. Start taking on: December 05, 2021        Contact information for follow-up providers     Dunn, Lisbeth Renshaw N, PA-C Follow up.   Specialties: Cardiology, Radiology Why: [facility] in [place] - a cardiology follow-up has been arranged for you on Monday Dec 21, 2021 at 2:20 PM (Arrive by 2:05 PM). Contact information: [address] [place] [address] [phone]              Contact information for after-discharge care     Destination      [facility] [place] [facility] .   Service: Skilled Nursing Contact information: [address] [place] [phone]                    Discharge Exam: Filed Weights   12/02/21 0503 12/03/21 0451 12/04/21 0538  Weight: 84.3 kg 84.2 kg 81.7 kg   HEENT:  [place]/AT, No thrush, no icterus CV:  RRR, no rub, no S3, no S4 Lung:  CTA, no wheeze, no rhonchi Abd:  soft/+BS, NT Ext:  No edema, no lymphangitis, no synovitis, no rash;  Right second toe without crepitance or erythema.  Small dorsal eschar   Condition at discharge: stable  The results of significant diagnostics from this hospitalization (including imaging, microbiology, ancillary and laboratory) are listed below for reference.  Imaging Studies: DG Chest 2 View  Result Date: 12/03/2021 CLINICAL DATA:  Shortness of breath EXAM: CHEST - 2 VIEW COMPARISON:  12/01/2021 FINDINGS: Heart is normal size. Mediastinal contours within normal limits. Aortic atherosclerosis. Small bilateral effusions. Improving aeration in the lungs. Mild residual left basilar atelectasis or infiltrate. IMPRESSION: Improving aeration with mild residual left base atelectasis or infiltrate. Small effusions. Electronically Signed   By: Rolm Baptise M.D.   On: 12/03/2021 10:02   CT LUMBAR SPINE WO CONTRAST  Result Date: 12/02/2021 CLINICAL DATA:  Low back pain, increased fracture risk. Fall, low back pain, suspect compression fracture. EXAM: CT LUMBAR SPINE WITHOUT CONTRAST TECHNIQUE: Multidetector CT imaging of the lumbar spine was performed without intravenous contrast administration. Multiplanar CT image reconstructions were also generated. RADIATION DOSE REDUCTION: This exam was performed according to the departmental dose-optimization program which includes automated exposure control, adjustment of the mA and/or kV according to patient size and/or use of iterative reconstruction technique. COMPARISON:   Lumbar spine radiographs 02/27/2009 FINDINGS: Segmentation: 5 lumbar type vertebrae. Alignment: Normal. Vertebrae: Horizontal fracture of the inferior aspect of the L1 vertebral body involving the inferior endplate with 50% vertebral body height loss. Fracture cleft along the inferior endplate containing a small amount of gas. Small age indeterminate Schmorl's node type deformity of the L1 superior endplate. No retropulsion of the posterior L1 vertebral body or posterior element fracture. Paraspinal and other soft tissues: Mild paravertebral soft tissue edema at L1. Atherosclerotic vascular calcifications including in the renal hila. Disc levels: Mild disc space narrowing at L3-4. Disc bulging and posterior element hypertrophy result in mild spinal stenosis at L3-4 and moderate to severe spinal stenosis at L4-5. Mild bilateral neural foraminal stenosis at L4-5 and L5-S1. IMPRESSION: 1. Acute L1 inferior endplate compression fracture with 25% vertebral body height loss. 2. 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 3. Aortic Atherosclerosis (ICD10-I70.0). Electronically Signed   By: Logan Bores M.D.   On: 12/02/2021 16:28   DG CHEST PORT 1 VIEW  Result Date: 12/01/2021 CLINICAL DATA:  539[address]. Admitted with CHF and multifocal pneumonia. Recent history of cellulitis lower extremity and metabolic encephalopathy. EXAM: PORTABLE CHEST 1 VIEW COMPARISON:  Portable chest 11/28/2021 FINDINGS: 4:51 a.m. Mild cardiomegaly. There is increased perihilar vascular congestion and moderate interstitial pulmonary edema with a basal gradient There are increased perihilar opacities radiating into the lower lung fields most likely indicating alveolar edema, with underlying pneumonia not excluded. Left lower lobe consolidation continues to be seen in the retrocardiac space and there are moderate increased pleural effusions. The upper 1/3 of the lungs remain clear of focal opacities but there is  increased apical interstitial edema. There is aortic atherosclerosis with stable mediastinum. Degenerative change thoracic spine. IMPRESSION: 1. Worsening CHF pattern with increased central vascular prominence and moderate pulmonary edema. 2. Perihilar opacities most likely from alveolar edema, underlying pneumonia not excluded. 3. Persistent left lower lobe consolidation with increasing bilateral moderate pleural effusions. 4. Aortic atherosclerosis. Electronically Signed   By: Telford Nab M.D.   On: 12/01/2021 06:54   ECHOCARDIOGRAM COMPLETE  Result Date: 11/29/2021    ECHOCARDIOGRAM REPORT   Patient Name:   JACOREY DONAWAY Date of Exam: 11/29/2021 Medical Rec #:  [id number]         Height:       72.0 in Accession #:    [id number]        Weight:       198.0 lb Date of Birth:  1941-03-28        BSA:          2.121 m Patient Age:    41 years          BP:           136/61 mmHg Patient Gender: M                 HR:           81 bpm. Exam Location:  [facility] Procedure: 2D Echo, Cardiac Doppler and Color Doppler Indications:    CAD Native Vessel I25.10, Congestive Heart Failure I50.9  History:        Patient has no prior history of Echocardiogram examinations.                 CAD; Risk Factors:Dyslipidemia, Diabetes and Hypertension. Hx                 S/P percutaneous coronary angioplasty.  Sonographer:    Alvino Chapel RCS Referring Phys: [place]  1. Left ventricular ejection fraction, by estimation, is 35%. The left ventricle has moderately decreased function. The left ventricle demonstrates regional wall motion abnormalities (see scoring diagram/findings for description). Left ventricular diastolic parameters are consistent with Grade II diastolic dysfunction (pseudonormalization).  2. Right ventricular systolic function is normal. The right ventricular size is normal. There is severely elevated pulmonary artery systolic pressure. The estimated right ventricular systolic pressure  is 55.3 mmHg.  3. Left atrial size was mildly dilated.  4. The mitral valve is grossly normal. Trivial mitral valve regurgitation. No evidence of mitral stenosis.  5. The aortic valve is normal in structure. There is mild calcification of the aortic valve. Aortic valve regurgitation is not visualized. No aortic stenosis is present.  6. The inferior vena cava is dilated in size with <50% respiratory variability, suggesting right atrial pressure of 15 mmHg. Conclusion(s)/Recommendation(s): No left ventricular mural or apical thrombus/thrombi. Swirling apical contrast is seen indicative of slow flow. FINDINGS  Left Ventricle: Left ventricular ejection fraction, by estimation, is 35%. The left ventricle has moderately decreased function. The left ventricle demonstrates regional wall motion abnormalities. Definity contrast agent was given IV to delineate the left ventricular endocardial borders. The left ventricular internal cavity size was normal in size. There is no left ventricular hypertrophy. Left ventricular diastolic parameters are consistent with Grade II diastolic dysfunction (pseudonormalization).  LV Wall Scoring: The mid and distal anterior wall, mid and distal lateral wall, mid and distal anterior septum, mid anterolateral segment, mid inferoseptal segment, and apical inferior segment are hypokinetic. Right Ventricle: The right ventricular size is normal. No increase in right ventricular wall thickness. Right ventricular systolic function is normal. There is severely elevated pulmonary artery systolic pressure. The tricuspid regurgitant velocity is 3.76 m/s, and with an assumed right atrial pressure of 15 mmHg, the estimated right ventricular systolic pressure is 74.8 mmHg. Left Atrium: Left atrial size was mildly dilated. Right Atrium: Right atrial size was normal in size. Pericardium: Trivial pericardial effusion is present. Mitral Valve: The mitral valve is grossly normal. Trivial mitral valve  regurgitation. No evidence of mitral valve stenosis. Tricuspid Valve: The tricuspid valve is normal in structure. Tricuspid valve regurgitation is mild . No evidence of tricuspid stenosis. Aortic Valve: The aortic valve is normal in structure. There is mild calcification of the aortic valve. Aortic valve regurgitation is not visualized. No aortic stenosis is present. Pulmonic Valve: The pulmonic valve was normal in structure.  Pulmonic valve regurgitation is trivial. No evidence of pulmonic stenosis. Aorta: The aortic root is normal in size and structure. Ascending aorta measurements are within normal limits for age when indexed to body surface area. Venous: The inferior vena cava is dilated in size with less than 50% respiratory variability, suggesting right atrial pressure of 15 mmHg. IAS/Shunts: No atrial level shunt detected by color flow Doppler.  LEFT VENTRICLE PLAX 2D LVIDd:         4.80 cm   Diastology LVIDs:         3.70 cm   LV e' medial:    6.31 cm/s LV PW:         1.10 cm   LV E/e' medial:  16.6 LV IVS:        1.20 cm   LV e' lateral:   6.96 cm/s LVOT diam:     2.20 cm   LV E/e' lateral: 15.1 LV SV:         63 LV SV Index:   30 LVOT Area:     3.80 cm  RIGHT VENTRICLE RV S prime:     14.80 cm/s TAPSE (M-mode): 2.5 cm LEFT ATRIUM             Index        RIGHT ATRIUM           Index LA diam:        4.40 cm 2.07 cm/m   RA Area:     17.20 cm LA Vol (A2C):   68.6 ml 32.34 ml/m  RA Volume:   48.90 ml  23.05 ml/m LA Vol (A4C):   82.8 ml 39.03 ml/m LA Biplane Vol: 76.4 ml 36.01 ml/m  AORTIC VALVE LVOT Vmax:   70.50 cm/s LVOT Vmean:  55.400 cm/s LVOT VTI:    0.165 m  AORTA Ao Root diam: 3.90 cm MITRAL VALVE                TRICUSPID VALVE MV Area (PHT): 5.66 cm     TR Peak grad:   56.6 mmHg MV Decel Time: 134 msec     TR Vmax:        376.00 cm/s MV E velocity: 105.00 cm/s MV A velocity: 62.10 cm/s   SHUNTS MV E/A ratio:  1.69         Systemic VTI:  0.16 m                             Systemic Diam: 2.20 cm  Cherlynn Kaiser MD Electronically signed by Cherlynn Kaiser MD Signature Date/Time: 11/29/2021/11:51:57 AM    Final    CT HEAD WO CONTRAST  Result Date: 11/28/2021 CLINICAL DATA:  Follow-up subdural hematoma EXAM: CT HEAD WITHOUT CONTRAST TECHNIQUE: Contiguous axial images were obtained from the base of the skull through the vertex without intravenous contrast. RADIATION DOSE REDUCTION: This exam was performed according to the departmental dose-optimization program which includes automated exposure control, adjustment of the mA and/or kV according to patient size and/or use of iterative reconstruction technique. COMPARISON:  11/28/2021, 10:17 a.m. FINDINGS: Brain: Unchanged, minimal left parafalcine subdural hemorrhage (series 4, image 22). No evidence of acute infarction, new hemorrhage, hydrocephalus, new extra-axial collection or mass lesion/mass effect. Vascular: No hyperdense vessel or unexpected calcification. Skull: Normal. Negative for fracture or focal lesion. Sinuses/Orbits: No acute finding. Other: Large soft tissue contusion of the left scalp vertex (series 4, image 15). IMPRESSION: 1. Unchanged,  minimal left parafalcine subdural hemorrhage. No new or enlarging hemorrhage. 2. Large soft tissue contusion of the left scalp vertex. Electronically Signed   By: Delanna Ahmadi M.D.   On: 11/28/2021 16:15   CT Head Wo Contrast  Addendum Date: 11/28/2021   ADDENDUM REPORT: 11/28/2021 10:42 ADDENDUM: Study discussed by telephone with PA TAMMY TRIPLETT on 11/28/2021 at 1037 hours. Electronically Signed   By: Genevie Ann M.D.   On: 11/28/2021 10:42   Result Date: 11/28/2021 CLINICAL DATA:  81 year old male found down. EXAM: CT HEAD WITHOUT CONTRAST TECHNIQUE: Contiguous axial images were obtained from the base of the skull through the vertex without intravenous contrast. RADIATION DOSE REDUCTION: This exam was performed according to the departmental dose-optimization program which includes automated exposure control,  adjustment of the mA and/or kV according to patient size and/or use of iterative reconstruction technique. COMPARISON:  Head CT 11/12/2021. FINDINGS: Brain: Trace anterior para falcine subdural blood suspected on series 3, image 18. No mass effect. No other acute intracranial hemorrhage identified. No midline shift, ventriculomegaly, mass effect, evidence of mass lesion, or evidence of cortically based acute infarction. No IVH. Stable gray-white matter differentiation throughout the brain. Vascular: Calcified atherosclerosis at the skull base. No suspicious intracranial vascular hyperdensity. Skull: Stable.  No acute osseous abnormality identified. Sinuses/Orbits: Visualized paranasal sinuses and mastoids are stable and well aerated. Other: Broad-based new left posterior convexity scalp hematoma. Underlying calvarium appears stable and intact. Stable orbits soft tissues. Calcified scalp vessel atherosclerosis. IMPRESSION: 1. Trace anterior para-falcine subdural blood no mass effect. No other acute intracranial abnormality. 2. Acute left posterior convexity scalp hematoma without underlying skull fracture. Electronically Signed: By: Genevie Ann M.D. On: 11/28/2021 10:34   CT Hip Right Wo Contrast  Result Date: 11/28/2021 CLINICAL DATA:  Patient BIB by [facility] after girlfriend to home this am to find patient on floor. Patient reports that he fell walking up steps around 0730 however, EMS believes that patient may have fallen overnight. Patient is confused on arrival. Found on second story of home after he crawled to couch. Complaining of right hip/lower back pain from fall. EXAM: CT OF THE RIGHT HIP WITHOUT CONTRAST TECHNIQUE: Multidetector CT imaging of the right hip was performed according to the standard protocol. Multiplanar CT image reconstructions were also generated. RADIATION DOSE REDUCTION: This exam was performed according to the departmental dose-optimization program which includes automated exposure control,  adjustment of the mA and/or kV according to patient size and/or use of iterative reconstruction technique. COMPARISON:  None Available. FINDINGS: Bones/Joint/Cartilage No fracture.  No bone lesion. Superolateral right hip joint space narrowing. Small marginal spurs from the base of the right femoral head and bony prominence from the superior right acetabulum, findings consistent with osteoarthritis. No convincing joint effusion. Ligaments Suboptimally assessed by CT. Muscles and Tendons No evidence of a muscle hematoma/injury. Tendons are grossly intact. Soft tissues No acute findings. IMPRESSION: 1. No fracture or acute finding. 2. Mild right hip joint osteoarthritis. Electronically Signed   By: Lajean Manes M.D.   On: 11/28/2021 10:39   DG Chest Port 1 View  Result Date: 11/28/2021 CLINICAL DATA:  Patient found on the floor this morning. Patient reports that he fell walking up steps this morning. EXAM: PORTABLE CHEST 1 VIEW COMPARISON:  11/11/2021. FINDINGS: Cardiac silhouette is normal in size. No mediastinal or hilar masses. Thickened bilateral interstitial markings. Mild hazy airspace opacity projects in the mid to lower lungs. Small pleural effusions suspected. No pneumothorax. Skeletal structures are grossly intact.  IMPRESSION: 1. Interstitial thickening with hazy lung opacities and probable small effusions. Suspect pulmonary edema, with multifocal pneumonia in the differential diagnosis. Electronically Signed   By: Lajean Manes M.D.   On: 11/28/2021 10:35   PERIPHERAL VASCULAR CATHETERIZATION  Result Date: 11/16/2021 DATE OF SERVICE: 11/13/2021  PATIENT:  [name]  81 y.o. 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  PATIENT DISPOSITION:  PACU - hemodynamically stable.  Delay start of Pharmacological VTE agent (>24hrs) due to surgical blood loss or risk of bleeding: no  INDICATION FOR PROCEDURE: AShenouda Genovais a 81y.o. male with right foot ulcer with peripheral arterial disease. After careful discussion of risks, benefits, and alternatives the patient was offered angiography. The patient understood and wished to proceed.  OPERATIVE FINDINGS: Terminal aorta and iliac arteries: Widely patent without flow-limiting stenosis  Right lower extremity: Common femoral artery: Widely patent without flow-limiting stenosis Profunda femoris artery: Widely patent without flow-limiting stenosis Superficial femoral artery: Diffusely diseased.  No stenosis greater than 50%. Popliteal artery: Diffusely diseased.  Widely patent. Anterior tibial artery: Occluded Tibioperoneal trunk: Widely patent Peroneal artery: Widely patent to the ankle.  Arborization to the PT, filling the foot. Posterior tibial artery: Patent at its origin.  Patent about the ankle.  Fills the pedal circulation. Pedal circulation: Disadvantaged.  Fills only through PT.  Small vessel disease noted.  GLASS score.  Not applicable.  Inline flow to the ankle.  Wifi score 1 / 1 / 2.  Clinical stage III.  DESCRIPTION OF PROCEDURE: After identification of the patient in the pre-operative holding area, the patient was transferred to the operating room. The patient was positioned supine on the operating room table. The groins were prepped and draped in standard fashion. A surgical pause was performed confirming correct patient, procedure, and operative location.  The left groin was anesthetized with subcutaneous injection of 1% lidocaine. Using ultrasound guidance, the left common femoral artery was accessed with  micropuncture technique. Fluoroscopy was used to confirm cannulation over the femoral head. The 19F sheath was upsized to 77F.  A Benson wire was advanced into the distal aorta. Over the wire an omni flush catheter was advanced to the level of L2. Aortogram was performed - see above for details.  The right common iliac artery was selected with an omniflush catheter and Bentson guidewire. The wire was advanced into the common femoral artery. Over the wire the omni flush catheter was advanced into the external iliac artery. Selective angiography was performed - see above for details.  The decision was made to intervene. The patient was heparinized with 8000 units of heparin. The 77F sheath was exchanged for a 51F x 45cm sheath. Selective angiography of the left lower extremity was performed prior to intervention.  I tried to recanalize the posterior tibial artery with a variety of wire and catheter exchanges.  I was unable to cross the lesion antegrade.  I elected to stop here.  All endovascular equipment was then removed.  A minx device was used to close the arteriotomy. Hemostasis was excellent upon completion.  Upon completion of  the case instrument and sharps counts were confirmed correct. The patient was transferred to the PACU in good condition. I was present for all portions of the procedure.  PLAN: ASA 76m PO QD. High intensity statin therapy.  Inline flow to the ankle.  Patient optimized from a vascular standpoint.  Would recommend local care to the toe only.  Would not recommend amputation at this time.  We will follow closely clinically.  If he has deterioration, we could consider retrograde posterior tibial access.  TYevonne Aline HStanford Breed MD [facility]ingtonPhone Number: [phone]/18/2023 9:53 AM   VAS [place]I WITH/WO TBI  Result Date: 11/12/2021  LOWER EXTREMITY DOPPLER STUDY Patient Name:  AJAVAN GONZAGA Date of Exam:   11/12/2021 Medical Rec #: [id number]          Accession #:    [id number] of Birth: 1Feb 02, 1942        Patient Gender: M Patient Age:   89years [facility]      VAS [place]I WITH/WO TBI Referring Phys: AGean Birchwood--------------------------------------------------------------------------------  Indications: Pain in right toe. High Risk Factors: Diabetes.  Comparison Study: No previous exam noted. Performing Technologist: RBobetta LimeBS, RVT  Examination Guidelines: A complete evaluation includes at minimum, Doppler waveform signals and systolic blood pressure reading at the level of bilateral brachial, anterior tibial, and posterior tibial arteries, when vessel segments are accessible. Bilateral testing is considered an integral part of a complete examination. Photoelectric Plethysmograph (PPG) waveforms and toe systolic pressure readings are included as required and additional duplex testing as needed. Limited examinations for reoccurring indications may be performed as noted.  ABI Findings: +---------+------------------+-----+-------------------+--------+ Right    Rt Pressure (mmHg)IndexWaveform           Comment  +---------+------------------+-----+-------------------+--------+ Brachial 157                    triphasic                   +---------+------------------+-----+-------------------+--------+ PTA      255               1.60 dampened monophasic         +---------+------------------+-----+-------------------+--------+ DP       103               0.65 dampened monophasic         +---------+------------------+-----+-------------------+--------+ Great Toe49                0.31                             +---------+------------------+-----+-------------------+--------+ +---------+------------------+-----+--------------------+-------+ Left     Lt Pressure (mmHg)IndexWaveform            Comment +---------+------------------+-----+--------------------+-------+ Brachial 159                     triphasic                   +---------+------------------+-----+--------------------+-------+ PTA      255               1.60 dampened monophasic         +---------+------------------+-----+--------------------+-------+ DP       255               1.60 Hyperemic/monophasic        +---------+------------------+-----+--------------------+-------+  Great Toe66                0.42                             +---------+------------------+-----+--------------------+-------+ +-------+-----------+-----------+------------+------------+ ABI/TBIToday's ABIToday's TBIPrevious ABIPrevious TBI +-------+-----------+-----------+------------+------------+ Right  [place]         0.31                                +-------+-----------+-----------+------------+------------+ Left   [place]         0.42                                +-------+-----------+-----------+------------+------------+  Summary: Right: Resting right ankle-brachial index indicates noncompressible right lower extremity arteries. The right toe-brachial index is abnormal. Left: Resting left ankle-brachial index indicates noncompressible left lower extremity arteries. The left toe-brachial index is abnormal. *See table(s) above for measurements and observations.  Electronically signed by Monica [name] MD on 11/12/2021 at 3:13:14 PM.    Final    MR FOOT RIGHT WO CONTRAST  Result Date: 11/12/2021 CLINICAL DATA:  Foot swelling, nondiabetic, osteomyelitis suspected EXAM: MRI OF THE RIGHT FOREFOOT WITHOUT CONTRAST TECHNIQUE: Multiplanar, multisequence MR imaging of the right forefoot was performed. No intravenous contrast was administered. COMPARISON:  Right foot radiograph 11/11/2021 FINDINGS: Bones/Joint/Cartilage Nondisplaced fracture at the base of the second toe proximal phalanx, which may be subacute. Minimal marrow edema in the proximal phalanx. No other significant marrow signal alteration. Ligaments Intact  Lisfranc ligament.  Intact MTP collateral ligaments. Muscles and Tendons No acute tendon tear. Diffuse intramuscular edema and atrophy in the foot as is commonly seen in diabetics. Soft tissues Generalized soft tissue swelling of the foot, with confluent edema along the second toe (series 4 images 8-15). No focal fluid collection. There is an adjacent blister like skin lesion. IMPRESSION: Nondisplaced fracture at base of the second toe proximal phalanx, which may be subacute. Adjacent confluent edema and a blister-like skin lesion noted along the plantar aspect of the second toe noted. No evidence of soft tissue abscess or osteomyelitis. Electronically Signed   By: Maurine Simmering M.D.   On: 11/12/2021 10:05   CT HEAD WO CONTRAST (5MM)  Result Date: 11/12/2021 CLINICAL DATA:  81 year old male with altered mental status. EXAM: CT HEAD WITHOUT CONTRAST TECHNIQUE: Contiguous axial images were obtained from the base of the skull through the vertex without intravenous contrast. RADIATION DOSE REDUCTION: This exam was performed according to the departmental dose-optimization program which includes automated exposure control, adjustment of the mA and/or kV according to patient size and/or use of iterative reconstruction technique. COMPARISON:  CT cervical spine 02/27/2009. FINDINGS: Brain: Cerebral volume loss appears fairly generalized. No midline shift, ventriculomegaly, mass effect, evidence of mass lesion, intracranial hemorrhage or evidence of cortically based acute infarction. Gray-white matter differentiation is within normal limits for age in both hemispheres. No cortical encephalomalacia identified. Vascular: Extensive Calcified atherosclerosis at the skull base. No suspicious intracranial vascular hyperdensity. Skull: No acute osseous abnormality identified. Sinuses/Orbits: Visualized paranasal sinuses and mastoids are well aerated. Other: No acute orbit or scalp soft tissue finding. Fairly extensive calcified  scalp vessel atherosclerosis. IMPRESSION: 1. No acute intracranial abnormality. Unremarkable for age noncontrast CT appearance of the brain. 2. Advanced calcified atherosclerosis, including involvement of scalp vessels. This appearance can be seen with  End stage renal disease. Electronically Signed   By: Genevie Ann M.D.   On: 11/12/2021 05:49   DG Foot Complete Right  Result Date: 11/11/2021 CLINICAL DATA:  Right foot wound infection. EXAM: RIGHT FOOT COMPLETE - 3+ VIEW COMPARISON:  None Available. FINDINGS: There is no evidence of fracture or dislocation. No lytic destruction is noted. Vascular calcifications are noted. Mild posterior calcaneal spurring is noted. IMPRESSION: No acute abnormality seen. Electronically Signed   By: Marijo Conception M.D.   On: 11/11/2021 12:35   DG Chest 2 View  Result Date: 11/11/2021 CLINICAL DATA:  Suspected sepsis EXAM: CHEST - 2 VIEW COMPARISON:  None FINDINGS: Heart is normal size. No confluent airspace opacities or effusions. Old healed left rib fracture. No acute bony abnormality. IMPRESSION: No active cardiopulmonary disease. Electronically Signed   By: Rolm Baptise M.D.   On: 11/11/2021 11:01    Microbiology: Results for orders placed or performed during the hospital encounter of 11/28/21  Blood culture (routine x 2)     Status: None   Collection Time: 11/28/21  9:08 AM   Specimen: Right Antecubital; Blood  Result Value Ref Range Status   Specimen Description   Final    RIGHT ANTECUBITAL BOTTLES DRAWN AEROBIC AND ANAEROBIC   Special Requests   Final    Blood Culture results may not be optimal due to an excessive volume of blood received in culture bottles   Culture   Final    NO GROWTH 5 DAYS Performed at [facility], [address], [place] [employer] [place], [place] [address]    Report Status 12/03/2021 FINAL  Final  Blood culture (routine x 2)     Status: None   Collection Time: 11/28/21  9:20 AM   Specimen: Left Antecubital; Blood  Result Value Ref Range Status    Specimen Description   Final    LEFT ANTECUBITAL BOTTLES DRAWN AEROBIC AND ANAEROBIC   Special Requests Blood Culture adequate volume  Final   Culture   Final    NO GROWTH 5 DAYS Performed at [facility] [address] [place], [place] [address]    Report Status 12/03/2021 FINAL  Final  Urine Culture     Status: None   Collection Time: 11/28/21  9:49 AM   Specimen: In/Out Cath Urine  Result Value Ref Range Status   Specimen Description   Final    IN/OUT CATH URINE Performed at [facility], [address] [place], [place] [address]    Special Requests   Final    NONE Performed at [facility], [address] [place], [place] [address]    Culture   Final    NO GROWTH Performed at [facility], [place] [address], [place],  [address]    Report Status 12/01/2021 FINAL  Final  Resp Panel by RT-PCR (Flu A&B, Covid) Anterior Nasal Swab     Status: None   Collection Time: 11/28/21 10:37 AM   Specimen: Anterior Nasal Swab  Result Value Ref Range Status   SARS Coronavirus 2 by RT PCR NEGATIVE NEGATIVE Final    Comment: (NOTE) SARS-CoV-2 target nucleic acids are NOT DETECTED.  The SARS-CoV-2 RNA is generally detectable in upper respiratory specimens during the acute phase of infection. The lowest concentration of SARS-CoV-2 viral copies this assay can detect is 138 copies/mL. A negative result does not preclude SARS-Cov-2 infection and should not be used as the sole basis for treatment or other patient management decisions. A negative result may occur  with  improper specimen collection/handling, submission of specimen other than nasopharyngeal swab, presence of viral mutation(s) within the areas targeted by this assay, and inadequate number of viral copies(<138 copies/mL). A negative result must be combined with clinical observations, patient history, and epidemiological information. The expected result is Negative.  Fact Sheet for Patients:   EntrepreneurPulse.com.au  Fact Sheet for Healthcare Providers:  IncredibleEmployment.be  This test is no t yet approved or cleared by the [place] FDA and  has been authorized for detection and/or diagnosis of SARS-CoV-2 by FDA under an Emergency Use Authorization (EUA). This EUA will remain  in effect (meaning this test can be used) for the duration of the COVID-19 declaration under Section 564(b)(1) of the Act, 21 U.S.C.section 360bbb-3(b)(1), unless the authorization is terminated  or revoked sooner.       Influenza A by PCR NEGATIVE NEGATIVE Final   Influenza B by PCR NEGATIVE NEGATIVE Final    Comment: (NOTE) The Xpert Xpress SARS-CoV-2/FLU/RSV plus assay is intended as an aid in the diagnosis of influenza from Nasopharyngeal swab specimens and should not be used as a sole basis for treatment. Nasal washings and aspirates are unacceptable for Xpert Xpress SARS-CoV-2/FLU/RSV testing.  Fact Sheet for Patients: EntrepreneurPulse.com.au  Fact Sheet for Healthcare Providers: IncredibleEmployment.be  This test is not yet approved or cleared by the [place] FDA and has been authorized for detection and/or diagnosis of SARS-CoV-2 by FDA under an Emergency Use Authorization (EUA). This EUA will remain in effect (meaning this test can be used) for the duration of the COVID-19 declaration under Section 564(b)(1) of the Act, 21 U.S.C. section 360bbb-3(b)(1), unless the authorization is terminated or revoked.  Performed at [facility], [address], [place], [place] [address]   MRSA Next Gen by PCR, Nasal     Status: None   Collection Time: 11/28/21  1:01 PM   Specimen: Nasal Mucosa; Nasal Swab  Result Value Ref Range Status   MRSA by PCR Next Gen NOT DETECTED NOT DETECTED Final    Comment: (NOTE) The GeneXpert MRSA Assay (FDA approved for NASAL specimens only), is one component of a  comprehensive MRSA colonization surveillance program. It is not intended to diagnose MRSA infection nor to guide or monitor treatment for MRSA infections. Test performance is not FDA approved in patients less than 59 years old. Performed at [facility], [address] [place], [place] [address]     Labs: CBC: Recent Labs  Lab 11/28/21 [phone] 11/29/21 0328 12/02/21 0425 12/03/21 0419 12/04/21 0513  WBC 10.8* 9.9 6.1 6.5 7.2  NEUTROABS 8.7* 8.6*  --   --   --   HGB 11.4* 10.2* 9.8* 10.5* 11.9*  HCT 36.2* 32.5* 30.6* 32.9* 37.5*  MCV 94.0 92.9 92.7 93.2 93.5  PLT 197 169 138* 139* 846*   Basic Metabolic Panel: Recent Labs  Lab 11/29/21 0328 11/30/21 0401 12/01/21 0433 12/02/21 0425 12/03/21 0419 12/04/21 0513  NA 138 136 138 137 139 138  K 4.1 4.2 4.4 3.8 3.9 3.6  CL 111 111 111 108 110 105  CO2 18* 19* 23 24 23  21*  GLUCOSE 263* 90 156* 138* 90 135*  BUN 27* 32* 31* 29* 32* 34*  CREATININE 1.17 1.21 1.20 1.20 1.13 1.17  CALCIUM 8.1* 7.9* 8.4* 8.2* 8.4* 8.4*  MG 2.1  --  2.3 2.2  --   --    Liver Function Tests: Recent Labs  Lab 11/28/21 0925  AST 26  ALT 23  ALKPHOS 248*  BILITOT 1.1  PROT 7.0  ALBUMIN 3.3*   CBG: Recent Labs  Lab 12/03/21 1110 12/03/21 1609 12/03/21 2135 12/04/21 0537 12/04/21 0744  GLUCAP 145* 190* 161* 137* 145*    Discharge time spent: greater than 30 minutes.  Signed: Orson Eva, MD [facility] 12/04/2021

## 2021-12-03 NOTE — Progress Notes (Signed)
Physical Therapy Treatment Patient Details Name: Wayne [name] MRN: [id number] DOB: 10-10-1940 Today's Date: 12/03/2021   History of Present Illness Pt is an 81 y.o. male admitted 11/11/21 for AMS, R foot ulcer and swelling for 5 days. R foot MRI showed fx at base of 2nd toe proximal phalanx; plan for medical management (WBAT in surgical shoe). S/p RLE angiogram on 8/18 revealing PAD without intervention. PMH includes CAD s/p stent, DM2, HTN.    PT Comments    Per chart patient to be fitted for TLSO brace due to L1 compression fracture and ambulation beyond bed side held until TLSO brought to hospital and fitted and applied to patient.  Patient c/o increased pain low back when HOB lowered, demonstrates fair return for rolling to side and sitting up from side lying position with HOB slightly raised and Min/mod assist.  Patient demonstrates slow labored movement during transfer to chair and fair/good return for completing BLE ROM/strengthening exercises requiring verbal cueing and demonstration.  Patient tolerated sitting up in chair after therapy - RN notified.  Patient will benefit from continued skilled physical therapy in hospital and recommended venue below to increase strength, balance, endurance for safe ADLs and gait.     Recommendations for follow up therapy are one component of a multi-disciplinary discharge planning process, led by the attending physician.  Recommendations may be updated based on patient status, additional functional criteria and insurance authorization.  Follow Up Recommendations  Skilled nursing-short term rehab (<3 hours/day) Can patient physically be transported by private vehicle: Yes   Assistance Recommended at Discharge    Patient can return home with the following Assistance with cooking/housework;Assist for transportation;Help with stairs or ramp for entrance;A little help with walking and/or transfers;A little help with bathing/dressing/bathroom    Equipment Recommendations  None recommended by PT    Recommendations for Other Services       Precautions / Restrictions Precautions Precautions: Fall Required Braces or Orthoses: Spinal Brace Spinal Brace: Thoracolumbosacral orthotic     Mobility  Bed Mobility Overal bed mobility: Needs Assistance Bed Mobility: Rolling, Sidelying to Sit Rolling: Min assist Sidelying to sit: Mod assist, Min assist       General bed mobility comments: slow labored movement requiring use of bed rail    Transfers Overall transfer level: Needs assistance Equipment used: Rolling walker (2 wheels) Transfers: Sit to/from Stand, Bed to chair/wheelchair/BSC Sit to Stand: Min assist   Step pivot transfers: Min assist       General transfer comment: slow labored unsteady movement with diffiuclty fulling extending trunk    Ambulation/Gait Ambulation/Gait assistance: Min assist Gait Distance (Feet): 4 Feet Assistive device: Rolling walker (2 wheels) Gait Pattern/deviations: Step-through pattern, Decreased step length - right, Decreased step length - left, Decreased stance time - left, Decreased stride length, Decreased weight shift to left, Trunk flexed, Wide base of support Gait velocity: decreased     General Gait Details: limited to a few side steps at bedside due to new orders for use of TLSO during ambulation   Stairs             Wheelchair Mobility    Modified Rankin (Stroke Patients Only)       Balance Overall balance assessment: Needs assistance Sitting-balance support: Feet supported, No upper extremity supported Sitting balance-Leahy Scale: Good Sitting balance - Comments: seated at EOB   Standing balance support: During functional activity, Bilateral upper extremity supported Standing balance-Leahy Scale: Fair Standing balance comment: using RW  Cognition Arousal/Alertness: Awake/alert Behavior During Therapy: WFL for  tasks assessed/performed Overall Cognitive Status: History of cognitive impairments - at baseline                                          Exercises General Exercises - Lower Extremity Long Arc Quad: Seated, AROM, Strengthening, Both, 10 reps Hip Flexion/Marching: Seated, AROM, Strengthening, Both, 10 reps Toe Raises: Seated, AROM, Strengthening, Both, 10 reps Heel Raises: Seated, AROM, Strengthening, Both, 10 reps    General Comments        Pertinent Vitals/Pain Pain Assessment Pain Assessment: Faces Faces Pain Scale: Hurts even more Pain Location: low back with HOB lowered Pain Descriptors / Indicators: Sore, Grimacing, Guarding Pain Intervention(s): Limited activity within patient's tolerance, Monitored during session, Repositioned    Home Living                          Prior Function            PT Goals (current goals can now be found in the care plan section) Acute Rehab PT Goals Patient Stated Goal: Go to rehab prior to going home. PT Goal Formulation: With patient/family Time For Goal Achievement: 12/13/21 Potential to Achieve Goals: Good Progress towards PT goals: Progressing toward goals    Frequency    Min 3X/week      PT Plan Current plan remains appropriate    Co-evaluation              AM-PAC PT "6 Clicks" Mobility   Outcome Measure  Help needed turning from your back to your side while in a flat bed without using bedrails?: A Little Help needed moving from lying on your back to sitting on the side of a flat bed without using bedrails?: A Little Help needed moving to and from a bed to a chair (including a wheelchair)?: A Little Help needed standing up from a chair using your arms (e.g., wheelchair or bedside chair)?: A Little Help needed to walk in hospital room?: A Little Help needed climbing 3-5 steps with a railing? : A Lot 6 Click Score: 17    End of Session   Activity Tolerance: Patient tolerated  treatment well;Patient limited by fatigue;Patient limited by pain Patient left: in chair;with call bell/phone within reach Nurse Communication: Mobility status PT Visit Diagnosis: Unsteadiness on feet (R26.81);Other abnormalities of gait and mobility (R26.89);Muscle weakness (generalized) (M62.81);Repeated falls (R29.6)     Time: 8127-5170 PT Time Calculation (min) (ACUTE ONLY): 21 min  Charges:  $Therapeutic Exercise: 8-22 mins $Therapeutic Activity: 8-22 mins                     2:00 PM, 12/03/21 Ocie Bob, MPT Physical Therapist with [facility] [phone] office [phone] mobile phone

## 2021-12-04 ENCOUNTER — Other Ambulatory Visit: Payer: Self-pay | Admitting: *Deleted

## 2021-12-04 ENCOUNTER — Telehealth: Payer: Self-pay | Admitting: *Deleted

## 2021-12-04 DIAGNOSIS — I5043 Acute on chronic combined systolic (congestive) and diastolic (congestive) heart failure: Secondary | ICD-10-CM

## 2021-12-04 DIAGNOSIS — I739 Peripheral vascular disease, unspecified: Secondary | ICD-10-CM

## 2021-12-04 LAB — CBC
HCT: 37.5 % — ABNORMAL LOW (ref 39.0–52.0)
Hemoglobin: 11.9 g/dL — ABNORMAL LOW (ref 13.0–17.0)
MCH: 29.7 pg (ref 26.0–34.0)
MCHC: 31.7 g/dL (ref 30.0–36.0)
MCV: 93.5 fL (ref 80.0–100.0)
Platelets: 144 10*3/uL — ABNORMAL LOW (ref 150–400)
RBC: 4.01 MIL/uL — ABNORMAL LOW (ref 4.22–5.81)
RDW: 13.7 % (ref 11.5–15.5)
WBC: 7.2 10*3/uL (ref 4.0–10.5)
nRBC: 0 % (ref 0.0–0.2)

## 2021-12-04 LAB — BASIC METABOLIC PANEL
Anion gap: 12 (ref 5–15)
BUN: 34 mg/dL — ABNORMAL HIGH (ref 8–23)
CO2: 21 mmol/L — ABNORMAL LOW (ref 22–32)
Calcium: 8.4 mg/dL — ABNORMAL LOW (ref 8.9–10.3)
Chloride: 105 mmol/L (ref 98–111)
Creatinine, Ser: 1.17 mg/dL (ref 0.61–1.24)
GFR, Estimated: >60 mL/min (ref >60)
Glucose, Bld: 135 mg/dL — ABNORMAL HIGH (ref 70–99)
Potassium: 3.6 mmol/L (ref 3.5–5.1)
Sodium: 138 mmol/L (ref 135–145)

## 2021-12-04 LAB — GLUCOSE, CAPILLARY
Glucose-Capillary: 137 mg/dL — ABNORMAL HIGH (ref 70–99)
Glucose-Capillary: 145 mg/dL — ABNORMAL HIGH (ref 70–99)
Glucose-Capillary: 122 mg/dL — ABNORMAL HIGH (ref 70–99)

## 2021-12-04 MED ORDER — ACETAMINOPHEN 500 MG PO TABS: 1000.0000 mg | ORAL_TABLET | Freq: Three times a day (TID) | ORAL | 0 refills | Status: DC

## 2021-12-04 MED ORDER — OXYCODONE HCL 5 MG PO TABS: 5.0000 mg | ORAL_TABLET | Freq: Four times a day (QID) | ORAL | 0 refills | Status: DC | PRN

## 2021-12-04 MED ORDER — SPIRONOLACTONE 25 MG PO TABS: 12.5000 mg | ORAL_TABLET | Freq: Every day | ORAL | Status: DC

## 2021-12-04 MED ORDER — METOPROLOL SUCCINATE ER 25 MG PO TB24: 12.5000 mg | ORAL_TABLET | Freq: Every day | ORAL | Status: DC

## 2021-12-04 NOTE — Progress Notes (Addendum)
Progress Note  Patient Name: Wayne [name] Date of Encounter: 12/04/2021  Primary Cardiologist: Werner Lean, MD  Subjective   Similar demeanor as before. Very polite, friendly. No CP or SOB. Continues to have some hip pain similar to before.  Inpatient Medications    Scheduled Meds:  acetaminophen  1,000 mg Oral Q6H   cefdinir  300 mg Oral Q12H   Chlorhexidine Gluconate Cloth  6 each Topical Daily   dapagliflozin propanediol  10 mg Oral Daily   doxycycline  100 mg Oral Q12H   furosemide  40 mg Oral Daily   guaiFENesin  600 mg Oral BID   insulin aspart  0-9 Units Subcutaneous TID WC   insulin aspart  3 Units Subcutaneous TID WC   linagliptin  5 mg Oral Daily   metoprolol succinate  12.5 mg Oral Daily   nitroGLYCERIN  0.2 mg Transdermal Daily   sacubitril-valsartan  1 tablet Oral BID   senna-docusate  1 tablet Oral QHS   spironolactone  12.5 mg Oral Daily   Continuous Infusions:  PRN Meds: bisacodyl, dextromethorphan, ipratropium-albuterol, ondansetron **OR** ondansetron (ZOFRAN) IV, oxyCODONE, polyethylene glycol, traZODone   Vital Signs    Vitals:   12/03/21 0451 12/03/21 1449 12/03/21 2109 12/04/21 0538  BP:  (!) 143/72 122/68 (!) 153/74  Pulse:  73 66 75  Resp:  16 16 17   Temp:  (!) 97.5 F (36.4 C) 97.8 F (36.6 C) 98.2 F (36.8 C)  TempSrc:   Oral Oral  SpO2:  94% 96% 94%  Weight: 84.2 kg   81.7 kg  Height:        Intake/Output Summary (Last 24 hours) at 12/04/2021 0813 Last data filed at 12/03/2021 1827 Gross per 24 hour  Intake --  Output 2200 ml  Net -2200 ml      12/04/2021    5:38 AM 12/03/2021    4:51 AM 12/02/2021    5:03 AM  Last 3 Weights  Weight (lbs) 180 lb 1.9 oz 185 lb 10 oz 185 lb 13.6 oz  Weight (kg) 81.7 kg 84.2 kg 84.3 kg     Telemetry    NSR - Personally Reviewed  ECG    No new tracings - Personally Reviewed  Physical Exam   GEN: No acute distress.  HEENT: Normocephalic, atraumatic, sclera non-icteric. Neck:  No JVD or bruits. Cardiac: RRR no murmurs, rubs, or gallops.  Respiratory: Clear to auscultation bilaterally. Breathing is unlabored. GI: Soft, nontender, non-distended, BS +x 4. MS: no deformity. Extremities: No clubbing or cyanosis. No edema. Distal pedal pulses are 2+ and equal bilaterally. Neuro:  A+O to self not place or time. Follows commands. Psych:  Responds to questions appropriately with a normal affect.  Labs    High Sensitivity Troponin:  No results for input(s): "TROPONINIHS" in the last 720 hours.    Cardiac EnzymesNo results for input(s): "TROPONINI" in the last 168 hours. No results for input(s): "TROPIPOC" in the last 168 hours.   Chemistry Recent Labs  Lab 11/28/21 0925 11/29/21 0328 12/02/21 0425 12/03/21 0419 12/04/21 0513  NA 138   < > 137 139 138  K 4.3   < > 3.8 3.9 3.6  CL 108   < > 108 110 105  CO2 22   < > 24 23 21*  GLUCOSE 223*   < > 138* 90 135*  BUN 22   < > 29* 32* 34*  CREATININE 1.13   < > 1.20 1.13 1.17  CALCIUM 8.2*   < >  8.2* 8.4* 8.4*  PROT 7.0  --   --   --   --   ALBUMIN 3.3*  --   --   --   --   AST 26  --   --   --   --   ALT 23  --   --   --   --   ALKPHOS 248*  --   --   --   --   BILITOT 1.1  --   --   --   --   GFRNONAA >60   < > >60 >60 >60  ANIONGAP 8   < > 5 6 12    < > = values in this interval not displayed.     Hematology Recent Labs  Lab 12/02/21 0425 12/03/21 0419 12/04/21 0513  WBC 6.1 6.5 7.2  RBC 3.30* 3.53* 4.01*  HGB 9.8* 10.5* 11.9*  HCT 30.6* 32.9* 37.5*  MCV 92.7 93.2 93.5  MCH 29.7 29.7 29.7  MCHC 32.0 31.9 31.7  RDW 13.6 13.6 13.7  PLT 138* 139* 144*    BNP Recent Labs  Lab 11/29/21 0328 12/01/21 0433 12/02/21 0425  BNP 2,052.0* 1,283.0* 1,331.0*     DDimer No results for input(s): "DDIMER" in the last 168 hours.   Radiology    DG Chest 2 View  Result Date: 12/03/2021 CLINICAL DATA:  Shortness of breath EXAM: CHEST - 2 VIEW COMPARISON:  12/01/2021 FINDINGS: Heart is normal size.  Mediastinal contours within normal limits. Aortic atherosclerosis. Small bilateral effusions. Improving aeration in the lungs. Mild residual left basilar atelectasis or infiltrate. IMPRESSION: Improving aeration with mild residual left base atelectasis or infiltrate. Small effusions. Electronically Signed   By: 01/31/2022 M.D.   On: 12/03/2021 10:02   CT LUMBAR SPINE WO CONTRAST  Result Date: 12/02/2021 CLINICAL DATA:  Low back pain, increased fracture risk. Fall, low back pain, suspect compression fracture. EXAM: CT LUMBAR SPINE WITHOUT CONTRAST TECHNIQUE: Multidetector CT imaging of the lumbar spine was performed without intravenous contrast administration. Multiplanar CT image reconstructions were also generated. RADIATION DOSE REDUCTION: This exam was performed according to the departmental dose-optimization program which includes automated exposure control, adjustment of the mA and/or kV according to patient size and/or use of iterative reconstruction technique. COMPARISON:  Lumbar spine radiographs 02/27/2009 FINDINGS: Segmentation: 5 lumbar type vertebrae. Alignment: Normal. Vertebrae: Horizontal fracture of the inferior aspect of the L1 vertebral body involving the inferior endplate with 25% vertebral body height loss. Fracture cleft along the inferior endplate containing a small amount of gas. Small age indeterminate Schmorl's node type deformity of the L1 superior endplate. No retropulsion of the posterior L1 vertebral body or posterior element fracture. Paraspinal and other soft tissues: Mild paravertebral soft tissue edema at L1. Atherosclerotic vascular calcifications including in the renal hila. Disc levels: Mild disc space narrowing at L3-4. Disc bulging and posterior element hypertrophy result in mild spinal stenosis at L3-4 and moderate to severe spinal stenosis at L4-5. Mild bilateral neural foraminal stenosis at L4-5 and L5-S1. IMPRESSION: 1. Acute L1 inferior endplate compression fracture  with 25% vertebral body height loss. 2. 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 3. Aortic Atherosclerosis (ICD10-I70.0). Electronically Signed   By: 14/04/2008 M.D.   On: 12/02/2021 16:28    Cardiac Studies   2D Echo 11/29/21   1. Left ventricular ejection fraction, by estimation, is 35%. The left  ventricle has moderately decreased function. The left ventricle  demonstrates  regional wall motion abnormalities (see scoring  diagram/findings for description). Left ventricular  diastolic parameters are consistent with Grade II diastolic dysfunction  (pseudonormalization).   2. Right ventricular systolic function is normal. The right ventricular  size is normal. There is severely elevated pulmonary artery systolic  pressure. The estimated right ventricular systolic pressure is 71.6 mmHg.   3. Left atrial size was mildly dilated.   4. The mitral valve is grossly normal. Trivial mitral valve  regurgitation. No evidence of mitral stenosis.   5. The aortic valve is normal in structure. There is mild calcification  of the aortic valve. Aortic valve regurgitation is not visualized. No  aortic stenosis is present.   6. The inferior vena cava is dilated in size with <50% respiratory  variability, suggesting right atrial pressure of 15 mmHg.   Conclusion(s)/Recommendation(s): No left ventricular mural or apical  thrombus/thrombi. Swirling apical contrast is seen indicative of slow  flow.     Patient Profile     81 y.o. male with PAD, DM, HTN, peripheral neuropathy, former tobacco abuse, CAD (details unclear, remote stent by Dr. 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 Returned to the hospital 11/28/2021 after being found by girlfriend having fallen. Found to have confusion, hypoxia, subdural hemorrhage, and multifocal PNA.  As part of his workup had echo showing EF 35% (The mid and distal anterior wall, mid and distal lateral wall, mid and distal anterior septum, mid anterolateral segment, mid inferoseptal segment, and apical inferior segment are hypokinetic), G2DD, severely elevated PASP, dilated IVC, swirling apical contrast is seen indicative of slow flow but no LV thrombus.    Assessment & Plan    1. Acute hypoxic respiratory failure with multiple medical issues - was diagnosed with multifocal PNA but also driven by component of acute HFrEF -> improved oxygenation with abx/diuresis - additional issues include lactic acidosis, subdural hemorrhage with fall, compression fx, recent PAD/infection issues, anemia, thrombocytopenia - ongoing IM/palliative follow-up   2. Acute HFrEF, HTN - EF 35% + WMA without prior to compare to -> CXR 12/01/21 c/w worsening CHF and increasing pleural effusions prompting increase in IV Lasix with good UOP and now normal O2 sat on RA - low dose metoprolol added 9/6 - changed to Entresto on 12/03/21 with addition of spironolactone and Farxiga same day, to begin oral Lasix today - suspect nearing discharge, will discuss DC med recs with MD - otherwise current cardiac rx includes NTG patch (home med -> this was started by podiatry 11/17/21 so suspect was a more local med not systemic, will defer to medicine team for review)   3. 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 Driven by hypoxia with PNA, CHF, also former tobacco abuse so cannot exclude some component of underlying pulm disease - given comorbidities and acute illness would hold off RHC and broad extensive workup for now - outpatient f/u, can consider repeat echocardiogram after follow-up to reassess when maintaining  euvolemia   4. Acute encephalopathy - remote notes in chart indicate some degree of underlying dementia - lived by himself PTA and has not been oriented to place or time any time I have spoken with him - felt to require SNF at discharge  5. Remote CAD s/p stenting - denies recent angina though would be concerned for progression of obstructive coronary disease given decline in LVEF - given subdural hemorrhage and mentation, not a candidate for invasive evaluation at present time (ASA stopped), follow conservatively with symptom-based approach for now - per IM notes, statin discontinued due to daughter request   6. Suspected CKD 3a - recent Cr 1.2-1.6 in 10/2021, stable today   7. PAD - OP f/u [facility]   Scheduled f/u 9/25 w/ me in [place].   [place] will sign off.   Medication Recommendations: Farxiga 10 mg daily, Lasix 40 mg daily, Toprol-XL 12.5 mg daily, Entresto 49-51 mg twice daily, spironolactone 12.5 mg daily Other recommendations (labs, testing, etc): BMET in 1 week Follow up as an outpatient: Scheduled for 9/25   For questions or updates, please contact Britt Please consult [email or web address] for contact info under Cardiology/STEMI.  Signed, Charlie Pitter, PA-C 12/04/2021, 8:13 AM     Patient seen and examined.  Agree with above documentation.  On exam, patient is alert and orientedx2, regular rate and rhythm, no murmurs, lungs CTAB, no LE edema or JVD.  BP 153/74 this morning.  Net -6.8 L on admission, switch to p.o. Lasix.  Cr stable at 1.17. Continue GDMT regimen as above.  Cardiology will sign off at this time, scheduled for follow-up on 9/25.  Donato Heinz, MD

## 2021-12-04 NOTE — Progress Notes (Signed)
Physical Therapy Treatment Patient Details Name: Wayne [name] MRN: [id number] DOB: 24-Oct-1940 Today's Date: 12/04/2021   History of Present Illness Pt is an 81 y.o. male admitted 11/11/21 for AMS, R foot ulcer and swelling for 5 days. R foot MRI showed fx at base of 2nd toe proximal phalanx; plan for medical management (WBAT in surgical shoe). S/p RLE angiogram on 8/18 revealing PAD without intervention. PMH includes CAD s/p stent, DM2, HTN.    PT Comments    Patient presents with TLSO on (fitted and applied to patient by vendor) and agreeable for therapy.  Patient demonstrates slow labored movement for rolling to side and sitting up from side lying position with c/o increased pain in low back, once seated had no back pain and demonstrated fair/good return for completing BLE ROM/strengthening exercises with verbal cueing and demonstration.  Patient demonstrated slow labored movement for completing sit to stands and limited to a few steps in room before having to sit due to fatigue and near loss of balance during transfer to chair.  Patient tolerated sitting up in chair after therapy - nursing staff notified.  Patient will benefit from continued skilled physical therapy in hospital and recommended venue below to increase strength, balance, endurance for safe ADLs and gait.     Recommendations for follow up therapy are one component of a multi-disciplinary discharge planning process, led by the attending physician.  Recommendations may be updated based on patient status, additional functional criteria and insurance authorization.  Follow Up Recommendations  Skilled nursing-short term rehab (<3 hours/day) Can patient physically be transported by private vehicle: No   Assistance Recommended at Discharge Intermittent Supervision/Assistance  Patient can return home with the following Assistance with cooking/housework;Assist for transportation;Help with stairs or ramp for entrance;A lot of help with  walking and/or transfers;A lot of help with bathing/dressing/bathroom   Equipment Recommendations  None recommended by PT    Recommendations for Other Services       Precautions / Restrictions Precautions Precautions: Fall Required Braces or Orthoses: Spinal Brace Spinal Brace: Thoracolumbosacral orthotic     Mobility  Bed Mobility Overal bed mobility: Needs Assistance Bed Mobility: Rolling, Sidelying to Sit Rolling: Min assist, Mod assist Sidelying to sit: Mod assist       General bed mobility comments: slow labored movement with c/o increased low back pain    Transfers Overall transfer level: Needs assistance Equipment used: Rolling walker (2 wheels) Transfers: Sit to/from Stand, Bed to chair/wheelchair/BSC Sit to Stand: Min assist   Step pivot transfers: Mod assist       General transfer comment: unsteady labored movement with poor carryover for reaching behind during stand to sitting in chair    Ambulation/Gait Ambulation/Gait assistance: Mod assist Gait Distance (Feet): 6 Feet Assistive device: Rolling walker (2 wheels) Gait Pattern/deviations: Step-through pattern, Decreased step length - right, Decreased step length - left, Decreased stance time - left, Decreased stride length, Decreased weight shift to left, Trunk flexed, Wide base of support Gait velocity: slow     General Gait Details: patient limited to a few slow labored steps forward before stopping due to fatigue and had to have chair pulled up to him to sit due to inability to step backwards   Stairs             Wheelchair Mobility    Modified Rankin (Stroke Patients Only)       Balance Overall balance assessment: Needs assistance Sitting-balance support: Feet supported, No upper extremity supported Sitting balance-Leahy Scale: Fair  Sitting balance - Comments: fair/good seated at EOB   Standing balance support: Reliant on assistive device for balance, During functional activity,  Bilateral upper extremity supported Standing balance-Leahy Scale: Poor Standing balance comment: fair/poor using RW                            Cognition Arousal/Alertness: Awake/alert Behavior During Therapy: WFL for tasks assessed/performed Overall Cognitive Status: Within Functional Limits for tasks assessed                                          Exercises General Exercises - Lower Extremity Long Arc Quad: Seated, AROM, Strengthening, Both, 10 reps Hip Flexion/Marching: Seated, AROM, Strengthening, Both, 10 reps Toe Raises: Seated, AROM, Strengthening, Both, 10 reps Heel Raises: Seated, AROM, Strengthening, Both, 10 reps    General Comments        Pertinent Vitals/Pain Pain Assessment Pain Assessment: Faces Faces Pain Scale: Hurts little more Pain Location: low back with movement Pain Descriptors / Indicators: Sore, Grimacing Pain Intervention(s): Limited activity within patient's tolerance, Monitored during session, Repositioned    Home Living                          Prior Function            PT Goals (current goals can now be found in the care plan section) Acute Rehab PT Goals Patient Stated Goal: Go to rehab prior to going home. PT Goal Formulation: With patient/family Time For Goal Achievement: 12/13/21 Potential to Achieve Goals: Good Progress towards PT goals: Progressing toward goals    Frequency    Min 3X/week      PT Plan Current plan remains appropriate    Co-evaluation              AM-PAC PT "6 Clicks" Mobility   Outcome Measure  Help needed turning from your back to your side while in a flat bed without using bedrails?: A Little Help needed moving from lying on your back to sitting on the side of a flat bed without using bedrails?: A Lot Help needed moving to and from a bed to a chair (including a wheelchair)?: A Lot Help needed standing up from a chair using your arms (e.g., wheelchair or  bedside chair)?: A Lot Help needed to walk in hospital room?: A Lot Help needed climbing 3-5 steps with a railing? : Total 6 Click Score: 12    End of Session Equipment Utilized During Treatment: Gait belt Activity Tolerance: Patient tolerated treatment well;Patient limited by fatigue Patient left: in chair;with call bell/phone within reach Nurse Communication: Mobility status PT Visit Diagnosis: Unsteadiness on feet (R26.81);Other abnormalities of gait and mobility (R26.89);Muscle weakness (generalized) (M62.81);Repeated falls (R29.6)     Time: 2426-8341 PT Time Calculation (min) (ACUTE ONLY): 28 min  Charges:  $Therapeutic Exercise: 8-22 mins $Therapeutic Activity: 8-22 mins                     {12:14 PM, 12/04/21 Ocie Bob, MPT Physical Therapist with [facility] [phone] office [phone] mobile phone

## 2021-12-04 NOTE — TOC Transition Note (Signed)
Transition of Care [facility]) - CM/SW Discharge Note   Patient Details  Name: Wayne [name] MRN: [id number] Date of Birth: Dec 07, 1940  Transition of Care [facility] CM/SW Contact:  Annice Needy, LCSW Phone Number: 12/04/2021, 12:37 PM   Clinical Narrative:    Discharge clinicals sent to facility. RN to call report. EMS to transport. TOC signing off.    Final next level of care: Skilled Nursing Facility Barriers to Discharge: No Barriers Identified   Patient Goals and CMS Choice Patient states their goals for this hospitalization and ongoing recovery are:: "be able to do my regular household duties"   Choice offered to / list presented to : Patient, Adult Children (daughter [name])  Discharge Placement              Patient chooses bed at: [facility] Patient to be transferred to facility by: [facility] Name of family member notified: daughter, [name] Patient and family notified of of transfer: 12/04/21  Discharge Plan and Services In-house Referral: Clinical Social Work   Post Acute Care Choice: Skilled Nursing Facility                               Social Determinants of Health (SDOH) Interventions     Readmission Risk Interventions    11/12/2021    2:59 PM  Readmission Risk Prevention Plan  Post Dischage Appt Complete  Medication Screening Complete  Transportation Screening Complete

## 2021-12-04 NOTE — Progress Notes (Signed)
Patient slept on and off through the night. Oxycodone 5 mg was given at 0221 for patients complaint of back pain. No further complaints this shift.

## 2021-12-04 NOTE — Progress Notes (Signed)
Report called to [facility] at this time.

## 2021-12-04 NOTE — Care Management Important Message (Signed)
Important Message  Patient Details  Name: Wayne [name] MRN: [id number] Date of Birth: 1940-10-12   Medicare Important Message Given:  Yes     Corey Harold 12/04/2021, 11:49 AM

## 2021-12-07 ENCOUNTER — Other Ambulatory Visit (HOSPITAL_BASED_OUTPATIENT_CLINIC_OR_DEPARTMENT_OTHER): Payer: Self-pay

## 2021-12-09 ENCOUNTER — Other Ambulatory Visit: Payer: Self-pay | Admitting: Podiatry

## 2021-12-15 ENCOUNTER — Ambulatory Visit (HOSPITAL_COMMUNITY): Payer: Medicare Other

## 2021-12-15 ENCOUNTER — Encounter: Payer: Medicare Other | Admitting: Vascular Surgery

## 2021-12-18 ENCOUNTER — Ambulatory Visit (HOSPITAL_COMMUNITY)
Admission: RE | Admit: 2021-12-18 | Discharge: 2021-12-18 | Disposition: A | Discharge status: Home / Self Care | Payer: No Typology Code available for payment source | Source: Ambulatory Visit | Attending: Vascular Surgery | Admitting: Vascular Surgery

## 2021-12-18 DIAGNOSIS — I739 Peripheral vascular disease, unspecified: Secondary | ICD-10-CM | POA: Diagnosis present

## 2021-12-21 ENCOUNTER — Encounter: Payer: Self-pay | Admitting: Physician Assistant

## 2021-12-21 ENCOUNTER — Ambulatory Visit: Payer: Medicare Other | Attending: Physician Assistant | Admitting: Physician Assistant

## 2021-12-21 ENCOUNTER — Telehealth: Payer: Self-pay | Admitting: Physician Assistant

## 2021-12-21 VITALS — BP 144/62 | HR 70 | Ht 72.0 in | Wt 156.0 lb

## 2021-12-21 DIAGNOSIS — N182 Chronic kidney disease, stage 2 (mild): Secondary | ICD-10-CM | POA: Diagnosis not present

## 2021-12-21 DIAGNOSIS — I272 Pulmonary hypertension, unspecified: Secondary | ICD-10-CM

## 2021-12-21 DIAGNOSIS — I5022 Chronic systolic (congestive) heart failure: Secondary | ICD-10-CM

## 2021-12-21 DIAGNOSIS — I2510 Atherosclerotic heart disease of native coronary artery without angina pectoris: Secondary | ICD-10-CM

## 2021-12-21 NOTE — Progress Notes (Unsigned)
[facility] [place]  ASSESSMENT / PLAN: Wayne [name] is a 81 y.o. male with worsening ulceration of the right second toe.  I suspect this may involve bone on clinical exam.  This is significantly worse than my last evaluation about a month ago.  Angiography shows no options for reconstruction.  He does have inline flow to the ankle via the peroneal only runoff.  This has robust arborization in the posterior tibial artery.  Unfortunately there are no options to improve his chances of healing from a vascular standpoint.  We will help arrange an urgent evaluation by podiatry for consideration of toe amputation.  He can follow-up with me on an as-needed basis.  CHIEF COMPLAINT: Worsening right second toe ulceration  HISTORY OF PRESENT ILLNESS: Wayne [name] is a 81 y.o. male who presents to clinic for evaluation.  I saw the patient as an inpatient consultation 11/12/2021 for ulceration of the right second toe.  At this point, the ulceration was fairly superficial.  I did angiography for him on 11/13/2021 which showed peroneal only runoff below the knee, with arborization to the posterior tibial artery.  Counseled patient he was optimized from a vascular standpoint and need to follow-up with podiatry at that time.  He was discharged to a nursing care facility.  Local care has continued to the foot.  He has not been referred to podiatry.  I again reviewed the angiography findings the patient and his daughter.  Past Medical History:  Diagnosis Date   Anemia of chronic disease    Chronic HFrEF (heart failure with reduced ejection fraction) (HCC)    Chronic kidney disease, stage 3a (HCC)    Coronary artery disease    Diabetes mellitus with circulatory complication (HCC)    Diabetic foot infection ([place])    Encephalopathy    Former tobacco use    PAD (peripheral artery disease) (HCC)    Peripheral neuropathy    SDH (subdural hematoma) (HCC)    Severe pulmonary hypertension  ([place])     Past Surgical History:  Procedure Laterality Date   ABDOMINAL AORTOGRAM W/LOWER EXTREMITY Right 11/13/2021   Procedure: ABDOMINAL AORTOGRAM W/LOWER EXTREMITY;  Surgeon: Cherre Robins, MD;  Location: [facility];  Service: Cardiovascular;  Laterality: Right;   CORONARY ANGIOPLASTY WITH STENT PLACEMENT      Family History  Problem Relation Age of Onset   Diabetes type II Neg Hx     Social History   Socioeconomic History   Marital status: Single    Spouse name: Not on file   Number of children: Not on file   Years of education: Not on file   Highest education level: Not on file  Occupational History   Not on file  Tobacco Use   Smoking status: Former    Types: Cigarettes   Smokeless tobacco: Not on file  Substance and Sexual Activity   Alcohol use: No   Drug use: No   Sexual activity: Not on file  Other Topics Concern   Not on file  Social History Narrative   Not on file   Social Determinants of Health   Financial Resource Strain: Not on file  Food Insecurity: Not on file  Transportation Needs: Not on file  Physical Activity: Not on file  Stress: Not on file  Social Connections: Not on file  Intimate Partner Violence: Not on file    No Known Allergies  Current Outpatient Medications  Medication Sig Dispense Refill   Accu-Chek Softclix  Lancets lancets Use 4 times daily as directed to check blood sugars. 100 each 0   acetaminophen (TYLENOL) 500 MG tablet Take 2 tablets (1,000 mg total) by mouth every 8 (eight) hours. 30 tablet 0   blood glucose meter kit and supplies KIT Dispense based on patient and insurance preference. Use up to four times daily as directed. 1 each 0   blood glucose meter kit and supplies KIT Use up to four times daily as directed. 1 each 0   dapagliflozin propanediol (FARXIGA) 10 MG TABS tablet Take 1 tablet (10 mg total) by mouth daily. 30 tablet    furosemide (LASIX) 40 MG tablet Take 1 tablet (40 mg total) by mouth daily.  30 tablet    glipiZIDE (GLUCOTROL) 5 MG tablet Take 0.5 tablets (2.5 mg total) by mouth daily before breakfast. 30 tablet 0   glucose blood test strip Use as instructed 100 each 12   glucose blood test strip Use 4 times daily as directed to check bloos sugars. 100 each 0   metoprolol succinate (TOPROL-XL) 25 MG 24 hr tablet Take 0.5 tablets (12.5 mg total) by mouth daily.     nitroGLYCERIN (NITRO-DUR) 0.2 mg/hr patch Place 1 patch (0.2 mg total) onto the skin daily. 30 patch 0   oxyCODONE (OXY IR/ROXICODONE) 5 MG immediate release tablet Take 1 tablet (5 mg total) by mouth every 6 (six) hours as needed for severe pain. 10 tablet 0   sacubitril-valsartan (ENTRESTO) 49-51 MG Take 1 tablet by mouth 2 (two) times daily. 60 tablet    spironolactone (ALDACTONE) 25 MG tablet Take 0.5 tablets (12.5 mg total) by mouth daily.     No current facility-administered medications for this visit.    PHYSICAL EXAM Vitals:   12/22/21 1105  BP: 114/60  Pulse: 71  Resp: 20  Temp: 98.3 F (36.8 C)  SpO2: 98%  Weight: 156 lb (70.8 kg)  Height: 6' (1.829 m)    Elderly, chronically ill gentleman in a wheelchair. Regular rate and rhythm Unlabored breathing Right second toe appears significantly worse with unstageable ulceration of the dorsal aspect.   PERTINENT LABORATORY AND RADIOLOGIC DATA  Most recent CBC    Latest Ref Rng & Units 12/04/2021    5:13 AM 12/03/2021    4:19 AM 12/02/2021    4:25 AM  CBC  WBC 4.0 - 10.5 K/uL 7.2  6.5  6.1   Hemoglobin 13.0 - 17.0 g/dL 11.9  10.5  9.8   Hematocrit 39.0 - 52.0 % 37.5  32.9  30.6   Platelets 150 - 400 K/uL 144  139  138      Most recent CMP    Latest Ref Rng & Units 12/04/2021    5:13 AM 12/03/2021    4:19 AM 12/02/2021    4:25 AM  CMP  Glucose 70 - 99 mg/dL 135  90  138   BUN 8 - 23 mg/dL 34  32  29   Creatinine 0.61 - 1.24 mg/dL 1.17  1.13  1.20   Sodium 135 - 145 mmol/L 138  139  137   Potassium 3.5 - 5.1 mmol/L 3.6  3.9  3.8   Chloride 98 - 111  mmol/L 105  110  108   CO2 22 - 32 mmol/L 21  23  24    Calcium 8.9 - 10.3 mg/dL 8.4  8.4  8.2     Renal function Estimated Creatinine Clearance: 48.7 mL/min (by C-G formula based on SCr of 1.17 mg/dL).  Hgb A1c MFr Bld (%)  Date Value  11/12/2021 9.4 (H)    LDL Cholesterol  Date Value Ref Range Status  12/01/2021 61 0 - 99 mg/dL Final    Comment:           Total Cholesterol/HDL:CHD Risk Coronary Heart Disease Risk Table                     Men   Women  1/2 Average Risk   3.4   3.3  Average Risk       5.0   4.4  2 X Average Risk   9.6   7.1  3 X Average Risk  23.4   11.0        Use the calculated Patient Ratio above and the CHD Risk Table to determine the patient's CHD Risk.        ATP III CLASSIFICATION (LDL):  <100     mg/dL   Optimal  100-129  mg/dL   Near or Above                    Optimal  130-159  mg/dL   Borderline  160-189  mg/dL   High  >190     mg/dL   Very High Performed at [facility], [address], [place], [place] [address]     +-------+-----------+-----------+------------+------------+  ABI/TBIToday's ABIToday's TBIPrevious ABIPrevious TBI  +-------+-----------+-----------+------------+------------+  Right  [place]         0.43       [place]          0.31          +-------+-----------+-----------+------------+------------+  Left   [place]         0.56       [place]          0.42          +-------+-----------+-----------+------------+------------+   Yevonne Aline. Stanford Breed, MD [facility] Phone Number: [phone] 12/22/2021 12:29 PM  Total time spent on preparing this encounter including chart review, data review, collecting history, examining the patient, coordinating care for this established patient, 40 minutes.  Portions of this report may have been transcribed using voice recognition software.  Every effort has been made to ensure accuracy; however, inadvertent computerized transcription errors may still be  present.

## 2021-12-21 NOTE — Telephone Encounter (Addendum)
Facility sent the available info:  9/23 BP 150/60 9/21 BP 114/67 9/20 BP 121/51, 121, 51 9/17 BP 128/70 9/13 BP 148/70 9/10 BP 144/68 9/10 BP 148/62 9/9   BP 140/76 9/9   BP 136/80 9/8   BP 132/70 9/8   BP 166/65  12/21/21 glu 143, calcium 9.1, BUN 39.7, Cr 1.46, K 4.9, Tbili 0.6, albumin 3.9, AST/ALT OK, alk phos 186 12/15/21 CBC Hgb 11.9, plt 204 12/05/21 TSH wnl, LDL 74, trig 152, HDL 33, K 3.7, Cr 1.18, Hgb 12.5, plt 159, A1C 8.1   (Per record review, Cr baseline not entirely clear. Cr was 1.4-1.5's during 10/2021 admission, 1.13-1.20 during 11/2021 admission.)   Tee, please find out if the facility made any adjustments when they saw today's labs. If not, based on this information (has begun rising BUN/Cr/K), I would suggest stopping his spironolactone. Please ask facility to check BP once daily after making this change (at least 3 hours after AM meds) and recheck BMET 1 week. He may be discharged from SNF by then based on daughter's report so can either have it faxed from their facility or have his daughter bring him to clinic for labs. We can touch base with them at that time for the BP readings.  Thank you!

## 2021-12-21 NOTE — Patient Instructions (Addendum)
Medication Instructions:  Your physician recommends that you continue on your current medications as directed. Please refer to the Current Medication list given to you today. *If you need a refill on your cardiac medications before your next appointment, please call your pharmacy*   Lab Work: None Ordered   Testing/Procedures: Your physician has requested that you have an LIMITED echocardiogram. Echocardiography is a painless test that uses sound waves to create images of your heart. It provides your doctor with information about the size and shape of your heart and how well your heart's chambers and valves are working. This procedure takes approximately one hour. There are no restrictions for this procedure.   Follow-Up: At [facility], you and your health needs are our priority.  As part of our continuing mission to provide you with exceptional heart care, we have created designated Provider Care Teams.  These Care Teams include your primary Cardiologist (physician) and Advanced Practice Providers (APPs -  Physician Assistants and Nurse Practitioners) who all work together to provide you with the care you need, when you need it.  We recommend signing up for the patient portal called "MyChart".  Sign up information is provided on this After Visit Summary.  MyChart is used to connect with patients for Virtual Visits (Telemedicine).  Patients are able to view lab/test results, encounter notes, upcoming appointments, etc.  Non-urgent messages can be sent to your provider as well.   To learn more about what you can do with MyChart, go to [email or web address].    Your next appointment:   6-8 week(s)  The format for your next appointment:   In Person  Provider:   Werner Lean, MD  or Melina Copa, PA-C      Other Instructions ALSO RECOMMEND TO FOLLOW UP WITH VASCULAR SURGERY TO FOLLOW UP ON VASCULAR DISEASE.  Important Information About Sugar

## 2021-12-21 NOTE — Progress Notes (Addendum)
Cardiology Office Note    Date:  12/21/2021   ID:  Wayne [name], DOB [birth year], MRN [id number]  PCP:  Patient, No Pcp Per  Cardiologist:  Christell Constant, MD  Electrophysiologist:  None   Chief Complaint: f/u CHF  History of Present Illness:   Wayne [name] is a 81 y.o. male with history of PAD, DM, HTN, peripheral neuropathy, former tobacco abuse, CAD (details unclear, remote stent by Dr. 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  Wayne [name] has some memory difficulty but did recall having a stent put in his heart by Dr. Deborah [name] over 20 years ago. He has not been following with cardiology for many years. 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 He had PV angio 11/13/21 with diffusely diseased popliteal but widely patent, occluded R tibial artery, unable to cross the lesion antegrade - recommended to follow clinically and if he had deterioration, consider retrograde posterior tibial access. He was discharged to home and lives alone but hired help and family to assist. He returned to the hospital 11/28/2021 with fall. He was found down on the floor by his girlfriend and unable to get up. EMS was called. He was confused, hypoxic 85% RA, and complained of shortness of breath. He was unable to recall details of fall but had scalp bruising so CT head was done showing left parafalcine subdural hemorrhage. Neurosurgery  has recommended conservative management per notes. Other issues included multifocal PNA, lactic acidosis, L1 compression fx, B12 deficiency, failure to thrive prompting palliative consultation and became DNR with otherwise full scope of care. As part of his workup had echo showing EF 35% (The mid and distal anterior wall, mid and distal lateral wall, mid and distal anterior septum, mid anterolateral segment, mid inferoseptal segment, and apical inferior segment are hypokinetic), G2DD, severely elevated PASP, dilated IVC, swirling apical contrast is seen indicative of slow flow but no LV thrombus. There was no echo on file to compare to. He was diuresed and GDMT advanced, with plan for conservative management/non-invasive strategy given his SDH and comorbidities. Daughter requested no statin. Most days we saw him he was pleasantly confused. He required discharge to SNF.   He is seen for follow-up today accompanied by his daughter. He still resides at [facility] but they are thinking he will be discharged soon. He has not had any CP, SOB, palpitations, edema, orthopnea. He is participating in PT and it is going well. CMA got 90/40 but my recheck 142/60 on the right, 144/62 on the left. His SBPs sent by the facility seem to vary from 1teens to 140s on different days. He is very anxious to leave as he states people are waiting to [name] him up downstairs.   Labwork independently reviewed: 11/2021 K 3.6, Cr 1.17, Hgb 10.5, plt 139, Mg 2.2, LDL 61, trig 85   Cardiology Studies:   Studies reviewed are outlined and summarized above. Reports included below if pertinent.   2D Echo 11/29/21   1. Left ventricular ejection fraction, by estimation, is 35%. The left  ventricle  has moderately decreased function. The left ventricle  demonstrates regional wall motion abnormalities (see scoring  diagram/findings for description). Left ventricular  diastolic parameters are consistent with Grade II diastolic dysfunction   (pseudonormalization).   2. Right ventricular systolic function is normal. The right ventricular  size is normal. There is severely elevated pulmonary artery systolic  pressure. The estimated right ventricular systolic pressure is 95.2 mmHg.   3. Left atrial size was mildly dilated.   4. The mitral valve is grossly normal. Trivial mitral valve  regurgitation. No evidence of mitral stenosis.   5. The aortic valve is normal in structure. There is mild calcification  of the aortic valve. Aortic valve regurgitation is not visualized. No  aortic stenosis is present.   6. The inferior vena cava is dilated in size with <50% respiratory  variability, suggesting right atrial pressure of 15 mmHg.   Conclusion(s)/Recommendation(s): No left ventricular mural or apical  thrombus/thrombi. Swirling apical contrast is seen indicative of slow  flow.    PV Angio 11/13/21 DATE OF SERVICE: 11/13/2021   PATIENT:  Wayne [name]  82 y.o. male   PRE-OPERATIVE DIAGNOSIS:  Atherosclerosis of native arteries of right lower extremity causing diabetic foot infection   POST-OPERATIVE DIAGNOSIS:  Same   PROCEDURE:   1) Ultrasound guided left common femoral artery access 2) Aortogram 3) Right lower extremity angiogram with third order cannulation (160m total contrast)   SURGEON:  Wayne Aline HStanford Breed MD   ASSISTANT: none   ANESTHESIA:   local   ESTIMATED BLOOD LOSS: minimal   LOCAL MEDICATIONS USED:  LIDOCAINE    COUNTS: confirmed correct.   PATIENT DISPOSITION:  PACU - hemodynamically stable.   Delay start of Pharmacological VTE agent (>24hrs) due to surgical blood loss or risk of bleeding: no   INDICATION FOR PROCEDURE: ARogerick Baldwinis a 81y.o. male with right foot ulcer with peripheral arterial disease. After careful discussion of risks, benefits, and alternatives the patient was offered angiography. The patient understood and wished to proceed.   OPERATIVE FINDINGS:  Terminal aorta and  iliac arteries: Widely patent without flow-limiting stenosis   Right lower extremity: Common femoral artery: Widely patent without flow-limiting stenosis  Profunda femoris artery: Widely patent without flow-limiting stenosis  Superficial femoral artery: Diffusely diseased.  No stenosis greater than 50%. Popliteal artery: Diffusely diseased.  Widely patent. Anterior tibial artery: Occluded Tibioperoneal trunk: Widely patent Peroneal artery: Widely patent to the ankle.  Arborization to the PT, filling the foot. Posterior tibial artery: Patent at its origin.  Patent about the ankle.  Fills the pedal circulation. Pedal circulation: Disadvantaged.  Fills only through PT.  Small vessel disease noted.   GLASS score.  Not applicable.  Inline flow to the ankle.   Wifi score 1 / 1 / 2.  Clinical stage III.   DESCRIPTION OF PROCEDURE: After identification of the patient in the pre-operative holding area, the patient was transferred to the operating room. The patient was positioned supine on the operating room table. The groins were prepped and draped in standard fashion. A surgical pause was performed confirming correct patient, procedure, and operative location.   The left groin was anesthetized with subcutaneous injection of 1% lidocaine. Using ultrasound guidance, the left common femoral artery was accessed with micropuncture technique. Fluoroscopy was used to confirm cannulation over the femoral head. The 74F sheath was upsized to 39F.    A Benson wire was advanced into the distal aorta. Over the wire an omni flush catheter was advanced  to the level of L2. Aortogram was performed - see above for details.   The right common iliac artery was selected with an omniflush catheter and Bentson guidewire. The wire was advanced into the common femoral artery. Over the wire the omni flush catheter was advanced into the external iliac artery. Selective angiography was performed - see above for details.    The  decision was made to intervene. The patient was heparinized with 8000 units of heparin. The 40F sheath was exchanged for a 38F x 45cm sheath. Selective angiography of the left lower extremity was performed prior to intervention.    I tried to recanalize the posterior tibial artery with a variety of wire and catheter exchanges.  I was unable to cross the lesion antegrade.  I elected to stop here.  All endovascular equipment was then removed.   A minx device was used to close the arteriotomy. Hemostasis was excellent upon completion.   Upon completion of the case instrument and sharps counts were confirmed correct. The patient was transferred to the PACU in good condition. I was present for all portions of the procedure.   PLAN: ASA 65m PO QD. High intensity statin therapy.  Inline flow to the ankle.  Patient optimized from a vascular standpoint.  Would recommend local care to the toe only.  Would not recommend amputation at this time.  We will follow closely clinically.  If he has deterioration, we could consider retrograde posterior tibial access.   Wayne Aline HStanford Breed MD [facility] LLCPhone Number: ([phone]/18/2023 9:53 AM    Past Medical History:  Diagnosis Date   Anemia of chronic disease    Chronic HFrEF (heart failure with reduced ejection fraction) ([facility]    Chronic kidney disease, stage 3a ([facility]    Coronary artery disease    Diabetes mellitus with circulatory complication ([facility]    Diabetic foot infection ([facility] [place]    Encephalopathy    Former tobacco use    PAD (peripheral artery disease) ([facility]    Peripheral neuropathy    SDH (subdural hematoma) ([facility]    Severe pulmonary hypertension ([facility]     Past Surgical History:  Procedure Laterality Date   ABDOMINAL AORTOGRAM W/LOWER EXTREMITY Right 11/13/2021   Procedure: ABDOMINAL AORTOGRAM W/LOWER EXTREMITY;  Surgeon: HCherre Robins MD;  [facility];  Service: Cardiovascular;   Laterality: Right;   CORONARY ANGIOPLASTY WITH STENT PLACEMENT      Current Medications: Current Meds  Medication Sig   Accu-Chek Softclix Lancets lancets Use 4 times daily as directed to check blood sugars.   acetaminophen (TYLENOL) 500 MG tablet Take 2 tablets (1,000 mg total) by mouth every 8 (eight) hours.   blood glucose meter kit and supplies KIT Dispense based on patient and insurance preference. Use up to four times daily as directed.   blood glucose meter kit and supplies KIT Use up to four times daily as directed.   dapagliflozin propanediol (FARXIGA) 10 MG TABS tablet Take 1 tablet (10 mg total) by mouth daily.   furosemide (LASIX) 40 MG tablet Take 1 tablet (40 mg total) by mouth daily.   glipiZIDE (GLUCOTROL) 5 MG tablet Take 0.5 tablets (2.5 mg total) by mouth daily before breakfast.   glucose blood test strip Use as instructed   glucose blood test strip Use 4 times daily as directed to check bloos sugars.   metoprolol succinate (TOPROL-XL) 25 MG 24 hr tablet Take 0.5 tablets (12.5 mg total)  by mouth daily.   nitroGLYCERIN (NITRO-DUR) 0.2 mg/hr patch Place 1 patch (0.2 mg total) onto the skin daily.   oxyCODONE (OXY IR/ROXICODONE) 5 MG immediate release tablet Take 1 tablet (5 mg total) by mouth every 6 (six) hours as needed for severe pain.   sacubitril-valsartan (ENTRESTO) 49-51 MG Take 1 tablet by mouth 2 (two) times daily.   spironolactone (ALDACTONE) 25 MG tablet Take 0.5 tablets (12.5 mg total) by mouth daily.      Allergies:   Patient has no known allergies.   Social History   Socioeconomic History   Marital status: Single    Spouse name: Not on file   Number of children: Not on file   Years of education: Not on file   Highest education level: Not on file  Occupational History   Not on file  Tobacco Use   Smoking status: Former    Types: Cigarettes   Smokeless tobacco: Not on file  Substance and Sexual Activity   Alcohol use: No   Drug use: No   Sexual  activity: Not on file  Other Topics Concern   Not on file  Social History Narrative   Not on file   Social Determinants of Health   Financial Resource Strain: Not on file  Food Insecurity: Not on file  Transportation Needs: Not on file  Physical Activity: Not on file  Stress: Not on file  Social Connections: Not on file     Family History:  The patient's family history is negative for Diabetes type II.  ROS:   Please see the history of present illness.  All other systems are reviewed and otherwise negative.    EKG(s)/Additional Labs   EKG:  EKG is  not ordered today  Recent Labs: 11/12/2021: TSH 2.113 11/28/2021: ALT 23 12/02/2021: B Natriuretic Peptide 1,331.0; Magnesium 2.2 12/04/2021: BUN 34; Creatinine, Ser 1.17; Hemoglobin 11.9; Platelets 144; Potassium 3.6; Sodium 138  Recent Lipid Panel    Component Value Date/Time   CHOL 105 12/01/2021 0433   TRIG 85 12/01/2021 0433   HDL 27 (L) 12/01/2021 0433   CHOLHDL 3.9 12/01/2021 0433   VLDL 17 12/01/2021 0433   LDLCALC 61 12/01/2021 0433    PHYSICAL EXAM:    VS:  BP (!) 90/40 with personal recheck 144/62 (BP Location: Left Arm, Patient Position: Sitting, Cuff Size: Normal)   Pulse 70   Ht 6' (1.829 m)   Wt 156 lb (70.8 kg)   SpO2 95%   BMI 21.16 kg/m   BMI: Body mass index is 21.16 kg/m.  GEN: Well nourished, well developed male in no acute distress HEENT: normocephalic, atraumatic Neck: no JVD, carotid bruits, or masses Cardiac: RRR; no murmurs, rubs, or gallops, no edema  Respiratory:  clear to auscultation bilaterally, normal work of breathing GI: soft, nontender, nondistended, + BS MS: no deformity or atrophy Skin: warm and dry, no rash Neuro:  Alert and Oriented to self only, Strength and sensation are intact, follows commands Psych: euthymic mood, full affect  Wt Readings from Last 3 Encounters:  12/21/21 156 lb (70.8 kg)  12/04/21 180 lb 1.9 oz (81.7 kg)  11/13/21 188 lb 6.4 oz (85.5 kg)      ASSESSMENT & PLAN:   1 .Chronic HFrEF - appears euvolemic on Lasix 44m daily. Weight is stated weight as he is in a wheelchair. Also on Toprol 12.527mdaily, Entresto 49/5135mID, spironolactone 12.5mg62mily, Farxiga 10mg87mly. I have asked CMA to call [facility] to see if  we can get copy of last BMET/CBC (had recent bloodwork per his daughter). Initial BP reported to be low on CMA check but repeat 140s/60s by me. Will ask for the facility to provide last 5 days of BPs as well for our review. Once we have this information, will decide on advancement of GDMT. As above, not felt to be a candidate for invasive ischemic cardiac workup given his subdural hemorrhage and comorbidities. Continue symptom-based approach.  2. Severe pulmonary HTN - PASP severely elevated at 72 by echo in setting of volume overload, left sided HF as well as pneumonia and hypoxia. It was suspected a large component was WHO group 2. Per Dr. Nelly Laurence recommendation will repeat limited echo to reassess PA pressures. He did not feel strong benefit in a broad extensive pulm HTN workup otherwise and I agree.   3. CAD - no recent anginal symptoms. Not on ASA with SDH. Not on statin per daughter's request. Continue beta blocker. Plan conservative management.  4. CKD stage 2-3a - will request copy of f/u labs from facility. Should continue to follow with primary care upon discharge from facility as well.     Disposition: F/u with myself or Dr. Gasper Sells in 6-8 weeks. Otherwise continue prior recommendation to f/u with vascular surgery (seen during prior admission). I do not see this scheduled. Have relayed recommendation to daughter to schedule (handwritten on AVS after it was printed).   Medication Adjustments/Labs and Tests Ordered: Current medicines are reviewed at length with the patient today.  Concerns regarding medicines are outlined above. Medication changes, Labs and Tests ordered today are summarized above and listed  in the Patient Instructions accessible in Encounters.   Signed, Charlie Pitter, PA-C  12/21/2021 2:59 PM    [facility] Phone: [phone]; Fax: [phone]

## 2021-12-22 ENCOUNTER — Telehealth: Payer: Self-pay | Admitting: Podiatry

## 2021-12-22 ENCOUNTER — Encounter: Payer: Self-pay | Admitting: Family Medicine

## 2021-12-22 ENCOUNTER — Encounter: Payer: Self-pay | Admitting: Vascular Surgery

## 2021-12-22 ENCOUNTER — Ambulatory Visit (HOSPITAL_BASED_OUTPATIENT_CLINIC_OR_DEPARTMENT_OTHER): Payer: Medicare Other | Admitting: Family Medicine

## 2021-12-22 ENCOUNTER — Ambulatory Visit (INDEPENDENT_AMBULATORY_CARE_PROVIDER_SITE_OTHER): Payer: Medicare Other

## 2021-12-22 ENCOUNTER — Ambulatory Visit (INDEPENDENT_AMBULATORY_CARE_PROVIDER_SITE_OTHER): Payer: Medicare Other | Admitting: Vascular Surgery

## 2021-12-22 ENCOUNTER — Ambulatory Visit: Payer: Medicare Other | Admitting: Podiatry

## 2021-12-22 ENCOUNTER — Non-Acute Institutional Stay: Payer: Medicare Other | Admitting: Family Medicine

## 2021-12-22 VITALS — BP 114/60 | HR 71 | Temp 98.3°F | Resp 20 | Ht 72.0 in | Wt 156.0 lb

## 2021-12-22 VITALS — BP 116/76 | HR 64 | Resp 18 | Wt 169.8 lb

## 2021-12-22 DIAGNOSIS — L03115 Cellulitis of right lower limb: Secondary | ICD-10-CM | POA: Diagnosis not present

## 2021-12-22 DIAGNOSIS — E1151 Type 2 diabetes mellitus with diabetic peripheral angiopathy without gangrene: Secondary | ICD-10-CM

## 2021-12-22 DIAGNOSIS — W19XXXS Unspecified fall, sequela: Secondary | ICD-10-CM

## 2021-12-22 DIAGNOSIS — L97512 Non-pressure chronic ulcer of other part of right foot with fat layer exposed: Secondary | ICD-10-CM

## 2021-12-22 DIAGNOSIS — I998 Other disorder of circulatory system: Secondary | ICD-10-CM

## 2021-12-22 DIAGNOSIS — I739 Peripheral vascular disease, unspecified: Secondary | ICD-10-CM

## 2021-12-22 MED ORDER — AMOXICILLIN-POT CLAVULANATE 875-125 MG PO TABS: 1.0000 | ORAL_TABLET | Freq: Two times a day (BID) | ORAL | 0 refills | Status: DC

## 2021-12-22 NOTE — Telephone Encounter (Signed)
Yes, the CMET was reviewed below. Since he will be discharged from the facility, please arrange OP BMET in 1 week in that case. Thank you.

## 2021-12-22 NOTE — Progress Notes (Signed)
[facility] Consult Note Telephone: [phone]  Fax: [phone]   Date of encounter: 12/22/21 2:45 PM PATIENT NAME: Wayne [name] [address] [place] [address]   [phone] (home)  DOB: 04-26-1940 MRN: [id number] PRIMARY CARE PROVIDER:    Patient, No Pcp Per,  No address on file None  REFERRING PROVIDER:   No referring provider defined for this encounter. N/A  RESPONSIBLE PARTY:    Contact Information     Name Relation Home Work Mobile   [place] Daughter [phone]  [phone]   Ninetta Lights [phone]          I met face to face with patient in the SNF. Palliative Care was asked to follow this patient by consultation request of  facility provider to address advance care planning and complex medical decision making. This is the initial visit.          ASSESSMENT, SYMPTOM MANAGEMENT AND PLAN / RECOMMENDATIONS:   Ischemia of toe, PAD Not amenable to revascularization procedures, possible amputation if wound care hyperbaric O2 unsuccessful or infection escalates. 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 Continue painting necrotic wound bed with Betadine Continue follow up with Podiatrist and wound care center Agree with [facility] PT, aide and RN   Right foot cellulitis Started on Augmentin 875-125 mg BID. Would recommend coverage for MRSA given open wound and community transmission of MRSA (although he recently had 3 days BID oral Doxycycline and MRSA PCR Nares was negative)   Type 2 DM Improved control with FSBS < 230 Agree with optimizing blood sugars and consider increase in Glipizide to 7.5 mg q am. Continue Farxiga 10 mg daily  4.    Fall at home, sequelae Has stable small SDH, likely to need additional PT for improved gait/stability and possible follow up vs SNF if pt has  amputation with renewed PT/OT  Follow up Palliative Care Visit: TBD-Patient is being discharged to home on 12/24/21 with home health care.    This visit was coded based on medical decision making (MDM).  PPS: 50%  HOSPICE ELIGIBILITY/DIAGNOSIS: TBD  Chief Complaint:  [facility] received a referral to follow up with patient for chronic disease management in setting of dementia with current complication of non-healing, ischemic necrotic toe wound.  Palliative Care is also following to assist with advance directive planning and defining/refining goals of care.  HISTORY OF PRESENT ILLNESS:  Wayne [name] is a 81 y.o. 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 Pt lives alone, evidently suffered a fall and was down for an unknown period of time before he was taken into the ER. Currently he denies CP, SOB, nausea/vomiting, dysuria, pain or constipation.  Is supposed to wear a TLSO brace but is intermittently non-compliant.  Recent right 2nd toe phalanx fracture prior to 11/28/21.  Per significant other, pt has no sensation in his right foot from his PAD, peripheral neuropathy as a result of his DM.  Per notes in the chart pt had recent hospital admission with findings of multifocal pneumonia with hypoxia in mid 80s treated with 3 days IV Cefepime with po Doxycycline and 2 days of oral cefdinir.  He was  also noted to have elevated blood sugar and cellulitis of RLE. He was seen by vascular surgery on 11/13/21 to attempt to maximize vascular flow and try to save the toe.  On 12/02/21 he was noted to have CT L spine with L1 compression fracture and 25% height loss, mod-severe spinal stenosis L4-L5. 11/29/21  2d echo:  EF 35%, G2DD, PASP 71.6 mm Hg, trivial MR, mild TR.   CT head 11/28/21 and repeat in 6 hours unchanged:  IMPRESSION: 1. Trace anterior para-falcine subdural blood no mass effect. No other acute intracranial abnormality. 2. Acute left posterior convexity scalp hematoma without underlying skull fracture.  MRI 11/13/21 of right foot did not show evidence of soft tissue abscess or osteomyelitis.  He was seen and re-evaluated by vascular surgeon Dr Jamelle Haring today and now suspects osteomyelitis with worsening of ulceration and pt was already maximized from a vascular flow standpoint.  He states angiography does not show options for reconstruction and has recommended urgent podiatry consultation which was previously recommended.  Now he is for urgent referral for consideration of toe amputation. Per vascular surgery he does have inline flow to the ankle via the peroneal only runoff.  This has robust arborization in the posterior tibial artery.  He was also seen by Dr Celesta Gentile, DPM today at [facility] who is treating for cellulitis with Augmentin 875 mg BID x 10 days and has written an order for urgent referral to wound care for hyperbaric oxygen therapy.  He states that pt is at high risk of proximal limb loss and wants to see if wound care can offer anything before doing toe amputation.  He says xray was negative for osteomyelitis 9/26.  Blood sugars last week were running 130s-270s, this week has decreased to 220 max with increase in Glipizide from 2.5 mg with breakfast to 5 mg daily.  Current plan is for pt d/c to home on Thursday with [facility] PT, OT and ST.  No PCP listed on records .  History obtained from review of EMR,  interview with family, facility staff and/or Mr. Commins.     Latest Ref Rng & Units 12/04/2021    5:13 AM 12/03/2021    4:19 AM 12/02/2021    4:25 AM  CBC  WBC 4.0 - 10.5 K/uL 7.2  6.5  6.1   Hemoglobin 13.0 - 17.0 g/dL 11.9  10.5  9.8   Hematocrit 39.0 - 52.0  % 37.5  32.9  30.6   Platelets 150 - 400 K/uL 144  139  138        Latest Ref Rng & Units 12/04/2021    5:13 AM 12/03/2021    4:19 AM 12/02/2021    4:25 AM  CMP  Glucose 70 - 99 mg/dL 135  90  138   BUN 8 - 23 mg/dL 34  32  29   Creatinine 0.61 - 1.24 mg/dL 1.17  1.13  1.20   Sodium 135 - 145 mmol/L 138  139  137   Potassium 3.5 - 5.1 mmol/L 3.6  3.9  3.8   Chloride 98 - 111 mmol/L 105  110  108   CO2 22 - 32 mmol/L _0 Calcium 8.9 - 10.3 mg/dL 8.4  8.4  8.2     11/28/21 Last albumin 3.3  I reviewed EMR for available labs, medications, imaging, studies and related documents.  Records reviewed and summarized above.   ROS-limited due to dementia General: NAD  ENMT: denies dysphagia Cardiovascular: denies chest pain, denies DOE Pulmonary: denies cough, denies increased SOB Abdomen: endorses good appetite, denies constipation, staff endorse continence of bowel GU: denies dysuria, staff endorses continence of urine MSK:  denies increased weakness, recent fall in Aug/Sept Skin: right 2nd toe wound Neurological: denies pain   Physical Exam: Current and past weights: 169.8 lbs today at the facility Constitutional: NAD General: thin/WD CV: S1S2, RRR, noted trace pedal R LE edema, 1+ pedal pulse on right Pulmonary: CTAB, no increased work of breathing, no cough, room air Abdomen: normo-active BS + 4 quadrants, soft and non tender MSK: no sarcopenia, moves all extremities, minimally ambulatory Skin: warm and dry, right 2nd toe with skin and fat layer loss on dorsal side with hard black eschar covering wound bed.  Dorsum of the foot from toes to just past midfoot warm, pinkish-erythema Neuro:  no generalized weakness, noted cognitive impairment Psych: non-anxious affect, A and O x 1 Hem/lymph/immuno: no widespread bruising  CURRENT PROBLEM LIST:  Patient Active Problem List   Diagnosis Date Noted   Acute on chronic combined systolic and diastolic CHF (congestive heart failure)  ([facility]) 12/04/2021   Lumbar compression fracture (HCC) 12/03/2021   Acute HFrEF (heart failure with reduced ejection fraction) ([facility] [facility]) 12/02/2021   Chronic kidney disease, stage 3a ([facility]) 12/02/2021   Ischemia of toe 12/01/2021   Constipation 11/30/2021   Acute respiratory failure with hypoxia ([facility]) 11/28/2021   Multifocal pneumonia 11/28/2021   Dyslipidemia 11/28/2021   Fall at home 11/28/2021   Subdural hematoma ([facility]) 11/28/2021   Failure to thrive in adult 11/28/2021   Lactic acidosis 11/28/2021   B12 deficiency 11/28/2021   B12 deficiency anemia 11/28/2021   PAD (peripheral artery disease) ([facility]) 11/28/2021   Diabetic neuropathy ([facility]) 11/28/2021   Cellulitis of right foot 11/11/2021   Type 2 diabetes mellitus with peripheral vascular disease ([place] [facility]) 11/11/2021   CAD S/P percutaneous coronary angioplasty 11/11/2021   Cellulitis 11/11/2021   PAST MEDICAL HISTORY:  Active Ambulatory Problems    Diagnosis Date Noted   Cellulitis of right foot 11/11/2021   Type 2 diabetes mellitus with peripheral vascular disease ([facility]) 11/11/2021   CAD S/P percutaneous coronary angioplasty 11/11/2021   Cellulitis 11/11/2021   Acute respiratory failure with hypoxia ([facility]) 11/28/2021   Multifocal pneumonia 11/28/2021   Dyslipidemia 11/28/2021   Fall at home 11/28/2021   Subdural hematoma ([facility]) 11/28/2021   Failure to thrive in adult 11/28/2021   Lactic acidosis 11/28/2021   B12 deficiency 11/28/2021   B12 deficiency anemia 11/28/2021   PAD (peripheral artery disease) ([facility]) 11/28/2021   Diabetic neuropathy ([facility]) 11/28/2021   Constipation 11/30/2021   Ischemia of toe 12/01/2021   Acute HFrEF (heart failure with reduced ejection fraction) ([facility]) 12/02/2021   Chronic kidney disease, stage 3a ([facility]) 12/02/2021   Lumbar compression fracture ([place]) 12/03/2021   Acute on chronic combined systolic and diastolic CHF (congestive heart failure) ([facility]) 12/04/2021   Resolved Ambulatory Problems    Diagnosis Date  Noted   No Resolved Ambulatory Problems   Past Medical History:  Diagnosis Date   Anemia of chronic disease    Chronic HFrEF (heart failure with reduced ejection fraction) (HCC)    Coronary artery disease    Diabetes mellitus with circulatory complication (HCC)    Diabetic foot infection ([facility])    Encephalopathy    Former tobacco use    Peripheral neuropathy    SDH (subdural hematoma) (HCC)    Severe pulmonary hypertension ([facility])  SOCIAL HX:  Social History   Tobacco Use   Smoking status: Former    Types: Cigarettes   Smokeless tobacco: Not on file  Substance Use Topics   Alcohol use: No   FAMILY HX:  Family History  Problem Relation Age of Onset   Diabetes type II Neg Hx        Preferred Pharmacy: ALLERGIES: No Known Allergies   PERTINENT MEDICATIONS:  Outpatient Encounter Medications as of 12/22/2021  Medication Sig   Accu-Chek Softclix Lancets lancets Use 4 times daily as directed to check blood sugars.   acetaminophen (TYLENOL) 500 MG tablet Take 2 tablets (1,000 mg total) by mouth every 8 (eight) hours.   amoxicillin-clavulanate (AUGMENTIN) 875-125 MG tablet Take 1 tablet by mouth 2 (two) times daily.   blood glucose meter kit and supplies KIT Dispense based on patient and insurance preference. Use up to four times daily as directed.   blood glucose meter kit and supplies KIT Use up to four times daily as directed.   dapagliflozin propanediol (FARXIGA) 10 MG TABS tablet Take 1 tablet (10 mg total) by mouth daily.   furosemide (LASIX) 40 MG tablet Take 1 tablet (40 mg total) by mouth daily.   glipiZIDE (GLUCOTROL) 5 MG tablet Take 0.5 tablets (2.5 mg total) by mouth daily before breakfast.   glucose blood test strip Use as instructed   glucose blood test strip Use 4 times daily as directed to check bloos sugars.   metoprolol succinate (TOPROL-XL) 25 MG 24 hr tablet Take 0.5 tablets (12.5 mg total) by mouth daily.   nitroGLYCERIN (NITRO-DUR) 0.2 mg/hr patch Place 1  patch (0.2 mg total) onto the skin daily.   oxyCODONE (OXY IR/ROXICODONE) 5 MG immediate release tablet Take 1 tablet (5 mg total) by mouth every 6 (six) hours as needed for severe pain.   sacubitril-valsartan (ENTRESTO) 49-51 MG Take 1 tablet by mouth 2 (two) times daily.   spironolactone (ALDACTONE) 25 MG tablet Take 0.5 tablets (12.5 mg total) by mouth daily.   No facility-administered encounter medications on file as of 12/22/2021.     -------------------------------------------------------------------------------------------------------------------------------------------------------------------------------------------------------------------------------------------- Advance Care Planning/Goals of Care: Goals include to maximize quality of life and symptom management.     Exploration of goals of care in the event of a sudden injury or illness  Identification  of a healthcare agent-daughter Michaell Cowing Review of an advance directive document -DNR. Decision not to resuscitate or to de-escalate disease focused treatments due to poor prognosis. CODE STATUS: DNR    Thank you for the opportunity to participate in the care of Mr. Wages.  The palliative care team will continue to follow. Please call our office at [phone] if we can be of additional assistance.   Marijo Conception, FNP-C  COVID-19 PATIENT SCREENING TOOL Asked and negative response unless otherwise noted:  Have you had symptoms of covid, tested positive or been in contact with someone with symptoms/positive test in the past 5-10 days?  no

## 2021-12-22 NOTE — Progress Notes (Incomplete)
[facility] Consult Note Telephone: [phone]  Fax: [phone]   Date of encounter: 12/22/21 10:34 PM PATIENT NAME: Wayne [name] [address] [place] [address]   [phone] (home)  DOB: [birth year] MRN: [id number] PRIMARY CARE PROVIDER:    Patient, No Pcp Per,  No address on file None  REFERRING PROVIDER:   No referring provider defined for this encounter. N/A  RESPONSIBLE PARTY:    Contact Information     Name Relation Home Work Mobile   Wayne [name] Daughter [phone]  [phone]   Wayne [name] [phone]          I met face to face with patient and family in the SNF. Palliative Care was asked to follow this patient by consultation request of  facility provider to address advance care planning and complex medical decision making. This is the initial visit.          ASSESSMENT, SYMPTOM MANAGEMENT AND PLAN / RECOMMENDATIONS:     Follow up Palliative Care Visit: Palliative care will follow up with daughter to determine goals of care and future of follow up.   This visit was coded based on medical decision making (MDM).  PPS: 50%  HOSPICE ELIGIBILITY/DIAGNOSIS: TBD  Chief Complaint:  Wayne [name] received a referral to follow up with patient for chronic disease management in setting of dementia with current complication of non-healing, ischemic necrotic toe wound.  Palliative Care is also following to assist with advance directive planning and defining/refining goals of care.  HISTORY OF PRESENT ILLNESS:  Wayne [name] is a 81 y.o. 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 Pt lives alone, evidently suffered a fall and was down for an unknown period of time before he was taken into the ER. Currently he denies CP, SOB, nausea/vomiting, dysuria, pain or constipation.  Is supposed to wear a TLSO brace but is intermittently non-compliant.  Recent right 2nd toe phalanx fracture prior to 11/28/21.  Per significant other, pt has no sensation in his right foot from his PAD, peripheral neuropathy as a result of his DM.  Per notes in the chart pt had recent hospital admission with findings of multifocal pneumonia with hypoxia in mid 80s treated with 3 days IV Cefepime with po Doxycycline and 2 days of oral cefdinir.  He was also noted to have elevated blood sugar and cellulitis of RLE. He was seen by vascular surgery on 11/13/21 to attempt to maximize vascular flow and try to save the toe.  On 12/02/21 he was noted to have CT L spine with L1 compression fracture and 25% height loss, mod-severe spinal stenosis L4-L5. 11/29/21 2d echo:  EF 35%, G2DD, PASP 71.6 mm Hg, trivial MR, mild TR.   CT head 11/28/21 and repeat in 6 hours unchanged:  IMPRESSION: 1. Trace anterior para-falcine subdural blood no mass effect. No other acute intracranial abnormality. 2. Acute left posterior convexity scalp hematoma without underlying skull fracture.  MRI 11/13/21 of right foot did not show evidence of soft tissue abscess or osteomyelitis.  He was seen and re-evaluated by vascular surgeon Wayne [name] today and now suspects osteomyelitis with worsening of ulceration and pt was already maximized from a vascular flow standpoint.  He states angiography does not  show options for reconstruction and has recommended urgent podiatry consultation which was previously recommended.  Now he is for urgent referral for consideration of toe amputation. Per vascular surgery he does have inline flow to the ankle via the peroneal only runoff.  This has robust arborization in the posterior tibial artery.  He was also  seen by Wayne [name], DPM today at [facility] who is treating for cellulitis with Augmentin 875 mg BID x 10 days and has written an order for urgent referral to wound care for hyperbaric oxygen therapy.  He states that pt is at high risk of proximal limb loss and wants to see if wound care can offer anything before doing toe amputation.  He says xray was negative for osteomyelitis 9/26.  Blood sugars last week were running 130s-270s, this week has decreased to 220 max with increase in Glipizide from 2.5 mg with breakfast to 5 mg daily.  Current plan is for pt d/c to home on Thursday with [facility], OT and ST.  No PCP listed on records .  History obtained from review of EMR,  interview with family, facility staff and/or Wayne [name].  I reviewed EMR for available labs, medications, imaging, studies and related documents.  Records reviewed and summarized above.   ROS General: NAD ENMT: denies dysphagia Cardiovascular: denies chest pain, denies DOE Pulmonary: denies cough, denies increased SOB Abdomen: endorses good appetite, denies constipation, endorses continence of bowel GU: denies dysuria, endorses continence of urine MSK:  denies increased weakness, no falls reported Skin: denies rashes or wounds Neurological: denies pain, denies insomnia Psych: Endorses positive mood Heme/lymph/immuno: denies bruises, abnormal bleeding  Physical Exam: Current and past weights: 169.8 lbs today at the facility Constitutional: NAD General: thin/WD ENMT: intact hearing, oral mucous membranes moist, dentition intact CV: S1S2, RRR, noted trace pedal R LE edema, 1+ pedal pulse on right Pulmonary: CTAB, no increased work of breathing, no cough, room air Abdomen: normo-active BS + 4 quadrants, soft and non tender MSK: no sarcopenia, moves all extremities, minimally ambulatory Skin: warm and dry, right 2nd toe with skin and fat layer loss on dorsal side with hard black eschar covering wound bed.   Dorsum of the foot from toes to just past midfoot warm, pinkish-erythema Neuro:  no generalized weakness, noted cognitive impairment Psych: non-anxious affect, A and O x 1 Hem/lymph/immuno: no widespread bruising  CURRENT PROBLEM LIST:  Patient Active Problem List   Diagnosis Date Noted  . Acute on chronic combined systolic and diastolic CHF (congestive heart failure) ([facility]) 12/04/2021  . Lumbar compression fracture ([place]) 12/03/2021  . Acute HFrEF (heart failure with reduced ejection fraction) ([place]) 12/02/2021  . Chronic kidney disease, stage 3a ([place]) 12/02/2021  . Ischemia of toe 12/01/2021  . Constipation 11/30/2021  . Acute respiratory failure with hypoxia ([facility]) 11/28/2021  . Multifocal pneumonia 11/28/2021  . Dyslipidemia 11/28/2021  . Fall at home 11/28/2021  . Subdural hematoma ([facility]) 11/28/2021  . Failure to thrive in adult 11/28/2021  . Lactic acidosis 11/28/2021  . B12 deficiency 11/28/2021  . B12 deficiency anemia 11/28/2021  . PAD (peripheral artery disease) ([place]) 11/28/2021  . Diabetic neuropathy ([place]) 11/28/2021  . Cellulitis of right foot 11/11/2021  . 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 CAD S/P percutaneous coronary angioplasty 11/11/2021  . Cellulitis 11/11/2021   PAST MEDICAL HISTORY:  Active Ambulatory Problems    Diagnosis Date Noted  . Cellulitis of right foot 11/11/2021  .  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 CAD S/P percutaneous coronary angioplasty 11/11/2021  . Cellulitis 11/11/2021  . Acute respiratory failure with hypoxia ([place]) 11/28/2021  . Multifocal pneumonia 11/28/2021  . Dyslipidemia 11/28/2021  . Fall at home 11/28/2021  . Subdural hematoma ([facility]) 11/28/2021  . Failure to thrive in adult 11/28/2021  . Lactic acidosis 11/28/2021  . B12 deficiency 11/28/2021  . B12 deficiency anemia 11/28/2021  . PAD (peripheral artery disease) ([place]) 11/28/2021  . Diabetic  neuropathy ([place]) 11/28/2021  . Constipation 11/30/2021  . Ischemia of toe 12/01/2021  . Acute HFrEF (heart failure with reduced ejection fraction) ([place]) 12/02/2021  . Chronic kidney disease, stage 3a ([facility]) 12/02/2021  . Lumbar compression fracture ([place]) 12/03/2021  . Acute on chronic combined systolic and diastolic CHF (congestive heart failure) ([employer]) 12/04/2021   Resolved Ambulatory Problems    Diagnosis Date Noted  . No Resolved Ambulatory Problems   Past Medical History:  Diagnosis Date  . Anemia of chronic disease   . Chronic HFrEF (heart failure with reduced ejection fraction) ([place])   . Coronary artery disease   . Diabetes mellitus with circulatory complication ([place])   . Diabetic foot infection ([place])   . Encephalopathy   . Former tobacco use   . Peripheral neuropathy   . SDH (subdural hematoma) ([place])   . Severe pulmonary hypertension ([facility])    SOCIAL HX:  Social History   Tobacco Use  . Smoking status: Former    Types: Cigarettes  . Smokeless tobacco: Not on file  Substance Use Topics  . Alcohol use: No   FAMILY HX:  Family History  Problem Relation Age of Onset  . Diabetes type II Neg Hx        Preferred Pharmacy: ALLERGIES: No Known Allergies   PERTINENT MEDICATIONS:  Outpatient Encounter Medications as of 12/22/2021  Medication Sig  . Accu-Chek Softclix Lancets lancets Use 4 times daily as directed to check blood sugars.  Marland Kitchen acetaminophen (TYLENOL) 500 MG tablet Take 2 tablets (1,000 mg total) by mouth every 8 (eight) hours.  Marland Kitchen amoxicillin-clavulanate (AUGMENTIN) 875-125 MG tablet Take 1 tablet by mouth 2 (two) times daily.  . blood glucose meter kit and supplies KIT Dispense based on patient and insurance preference. Use up to four times daily as directed.  . blood glucose meter kit and supplies KIT Use up to four times daily as directed.  . dapagliflozin propanediol (FARXIGA) 10 MG TABS tablet Take 1 tablet (10 mg total) by mouth daily.  . furosemide  (LASIX) 40 MG tablet Take 1 tablet (40 mg total) by mouth daily.  Marland Kitchen glipiZIDE (GLUCOTROL) 5 MG tablet Take 0.5 tablets (2.5 mg total) by mouth daily before breakfast.  . glucose blood test strip Use as instructed  . glucose blood test strip Use 4 times daily as directed to check bloos sugars.  . metoprolol succinate (TOPROL-XL) 25 MG 24 hr tablet Take 0.5 tablets (12.5 mg total) by mouth daily.  . nitroGLYCERIN (NITRO-DUR) 0.2 mg/hr patch Place 1 patch (0.2 mg total) onto the skin daily.  Marland Kitchen oxyCODONE (OXY IR/ROXICODONE) 5 MG immediate release tablet Take 1 tablet (5 mg total) by mouth every 6 (six) hours as needed for severe pain.  . sacubitril-valsartan (ENTRESTO) 49-51 MG Take 1 tablet by mouth 2 (two) times daily.  Marland Kitchen spironolactone (ALDACTONE) 25 MG tablet Take 0.5 tablets (12.5 mg total) by mouth daily.   No facility-administered encounter medications on file as of 12/22/2021.     --------------------------------------------------------------------------------------------------------------------------------------------------------------------------------------------------------------------------------------------  Advance Care Planning/Goals of Care: Goals include to maximize quality of life and symptom management. Patient/health care surrogate gave his/her permission to discuss.Our advance care planning conversation included a discussion about:    The value and importance of advance care planning  Experiences with loved ones who have been seriously ill or have died  Exploration of personal, cultural or spiritual beliefs that might influence medical decisions  Exploration of goals of care in the event of a sudden injury or illness  Identification  of a healthcare agent  Review and updating or creation of an  advance directive document . Decision not to resuscitate or to de-escalate disease focused treatments due to poor prognosis. CODE STATUS:     Thank you for the opportunity to  participate in the care of Mr. Westrup.  The palliative care team will continue to follow. Please call our office at [phone] if we can be of additional assistance.   Marijo Conception, FNP-C  COVID-19 PATIENT SCREENING TOOL Asked and negative response unless otherwise noted:  Have you had symptoms of covid, tested positive or been in contact with someone with symptoms/positive test in the past 5-10 days?  no

## 2021-12-22 NOTE — Telephone Encounter (Signed)
I spoke with Sharyn Lull a nurse at [facility], she stated the patient had a cmet yesterday. She also states the patient will be discharged on Thursday. She states for me to send a copy of your recommendations for the medication changes.   Copy of recommendations faxed.

## 2021-12-22 NOTE — Telephone Encounter (Signed)
Left message for the patients daughter advising her to contact the office in regards to her dad. We need to schedule a lab appt, echo appointment and 6-8 week follow up with Dayna or Dr Gasper Sells.

## 2021-12-22 NOTE — Telephone Encounter (Signed)
Received call from [place] at pts nursing facility([facility]) stating pt was to have an appt in a week to see Dr Jacqualyn Posey but pt did not get  scheduled when he left. I see he is being sent to [facility] as well. Does he need an appt next week with you? Her # is [phone]

## 2021-12-22 NOTE — Telephone Encounter (Signed)
Spoke with Clifton Custard at [facility] and she states she did not get the fax.   Recommendations have been refaxed twice and confirmation has been received.

## 2021-12-22 NOTE — Telephone Encounter (Signed)
Called the skilled nursing facility and requested to speak with a nurse. The receptionist stated she would have to find a nurse and have them give me a call back. I left my name and number and awaiting a call back.

## 2021-12-23 NOTE — Telephone Encounter (Signed)
Attempted to call back Sharyn Lull at [facility], no answer and unable to leave voicemail. Will try again later.

## 2021-12-23 NOTE — Telephone Encounter (Signed)
Great, thank you so much. We can still keep plan for close follow-up. Based on labs 9/25 I had recommended a BMET in 1 week so if need be he can have clinic visit that day as well. I do appreciate everyone's help and care with him.

## 2021-12-23 NOTE — Telephone Encounter (Signed)
Please get more information. I did not order a CXR.

## 2021-12-23 NOTE — Telephone Encounter (Signed)
Spoke with Sharyn Lull who states that the CXR was a follow up from a previous CXR that was done a week prior. He was not having any new symptoms or weight gain.  She states that the NP did the first CXR to follow up on his heart failure. The NP did not feel any need to adjust diuretic therapy at this time as the patietn is being discharged tomorrow.

## 2021-12-23 NOTE — Telephone Encounter (Signed)
Spoke with the nurse Sharyn Lull at [facility]. She states that the NP at the facility reviewed Dayna's office visit note from yesterday about stopping patient's aldactone. She states that they did a chest x-ray this morning showing mild pulmonary venous congestion. She states that the patient needs lab work in a week. She states that the patient is being discharged home from their facility tomorrow. The patient does not have a PCP and the NP at the facility is concerned about the patient being lost to follow up. She is requesting that the patient follow up with our office in a week. I have scheduled the patient an appointment for follow up. They are also faxing over his chest x-ray results to our office.

## 2021-12-23 NOTE — Telephone Encounter (Signed)
Patient was previously scheduled to see Dayna on 10/4. It looks like he called and cancelled his appointment due to a conflict. Called patient and LVM to call back and schedule.

## 2021-12-23 NOTE — Telephone Encounter (Signed)
Pt is scheduled to see Dr Jacqualyn Posey 10.3.2023. I have scheduled with pts daughter as the number the nursing facility gave me is a fax number. Pts daughter stated they are taking the pt out of the facility by next week,

## 2021-12-23 NOTE — Telephone Encounter (Signed)
Nurse would like a callback regarding recent findings on pt's Chest Xray this morning. Please advise

## 2021-12-23 NOTE — Addendum Note (Signed)
Addended by: Antonieta Iba on: 12/23/2021 02:14 PM   Modules accepted: Orders

## 2021-12-23 NOTE — Telephone Encounter (Signed)
Referral packet has been faxed over

## 2021-12-23 NOTE — Telephone Encounter (Addendum)
Can you find out why CXR was done or if he was having new symptoms or new weight gain? Was asymptomatic at our visit on 12/21/21 with clear lungs and labs that day suggested intravascular depletion with rising BUN/Cr. Also looks to have had normal vitals and O2 at vascular office visit yesterday as well. I would recommend the facility provider that saw him at time of need for CXR to guide diuretic adjustment if felt clinically appropriate and we can see in follow-up to reassess - OK to move up if requested so he can be re-evaluated for new decompensation.

## 2021-12-24 NOTE — Progress Notes (Signed)
   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 male who presents today for follow-up infection right foot-second toe.  Presents today for follow-up.  Since I last saw him he has been in the hospital for other issues.  He saw vascular surgery today concern for osteomyelitis and was sent here for urgent follow-up.  Denies any fevers or chills.   No Known Allergies  Physical Exam  Objective: AAO x3, NAD Pulses decreased right foot.  CRT is still delayed to the second toe but it is present.   Dry gangrenous changes present along the right second toe dorsally.  There is no drainage or pus or any fluctuation or crepitation.  The plantar aspect of the skin appears to be pink.  There is still a delayed capillary refill time to the toe.  There is no malodor.  There is mild erythema to the dorsal foot just proximal to the MPJ.  There is no increased temperature gradient. No pain with calf compression, erythema or warmth.  (Picture did not save)   Assessment: Ulceration, PAD  Plan: -Treatment options discussed including all alternatives, risks, and complications. -Etiology of symptoms were discussed -x-rays were obtained reviewed.  Only able to obtain to views. No definitive evidence of acute osteomyelitis noted. -received a note that he is optimized from a vascular standpoint but still high risk of amputation.  I discussed with the patient proceed with amputation of the toe given the wound however his daughter and him would like to also proceed with other options.  Discussed wound care they want to try this.  Urgent wound care referral was placed.  If not able to help then we will proceed with toe potation.  I will see him back early next week. -Augmentin -Discussed that if any worsening to report emergency room.  Trula Slade DPM

## 2021-12-25 NOTE — Telephone Encounter (Signed)
Noted, please see if we can get a copy of those labs scanned to my inbasket when available. Thanks!

## 2021-12-25 NOTE — Progress Notes (Signed)
ARHUM, PEEPLES ([id number]) Visit Report for 12/28/2021 Allergy List Details Patient Name: Date of Service: Wayne [name] [name] 12/28/2021 8:00 Wayne M Medical Record Number: [id number] Patient Account Number: [id number] Date of Birth/Sex: Treating RN: [birth year] (81 y.o. [name] Primary Care Madelon Welsch: PA Haig Prophet, NO Other Clinician: Referring Alasia Enge: Treating Journii Nierman/Extender: Yaakov Guthrie in Treatment: 0 Allergies Active Allergies No Known Allergies Allergy Notes Electronic Signature(s) Signed: 12/28/2021 4:30:21 PM By: Lorrin Jackson Previous Signature: 12/25/2021 12:13:27 PM Version By: Lorrin Jackson Entered By: Lorrin Jackson on 12/28/2021 08:21:52 -------------------------------------------------------------------------------- Arrival Information Details Patient Name: Date of Service: Wayne [name], Wayne [name] 12/28/2021 8:00 Wayne M Medical Record Number: [id number] Patient Account Number: [id number] Date of Birth/Sex: Treating RN: [birth year] (80 y.o. [name] Primary Care Zurri Rudden: PA Haig Prophet, NO Other Clinician: Referring Adaly Puder: Treating Eligha Kmetz/Extender: Yaakov Guthrie in Treatment: 0 Visit Information Patient Arrived: Wheel Chair Arrival Time: 08:15 Accompanied By: Daughter Transfer Assistance: Manual Patient Identification Verified: Yes Secondary Verification Process Completed: Yes Patient Requires Transmission-Based Precautions: No Patient Has Alerts: Yes Patient Alerts: 12/18/21 ABI's=Non Comp 12/18/21 TBI= R .43 L .56 Electronic Signature(s) Signed: 12/28/2021 4:30:21 PM By: Lorrin Jackson Entered By: Lorrin Jackson on 12/28/2021 08:35:12 -------------------------------------------------------------------------------- Clinic Level of Care Assessment Details Patient Name: Date of Service: Wayne [name], Wayne [name] 12/28/2021 8:00 Wayne M Medical Record Number: [id number] Patient Account Number: [id number] Date of Birth/Sex: Treating  RN: 1941-02-03 (81 y.o. [name] Primary Care Hadyn Azer: PA Haig Prophet, NO Other Clinician: Referring Amish Mintzer: Treating Malgorzata Albert/Extender: Yaakov Guthrie in Treatment: 0 Clinic Level of Care Assessment Items TOOL 1 Quantity Score X- 1 0 Use when EandM and Procedure is performed on INITIAL visit ASSESSMENTS - Nursing Assessment / Reassessment X- 1 20 General Physical Exam (combine w/ comprehensive assessment (listed just below) when performed on Wayne pt. evals) X- 1 25 Comprehensive Assessment (HX, ROS, Risk Assessments, Wounds Hx, etc.) ASSESSMENTS - Wound and Skin Assessment / Reassessment []  - 0 Dermatologic / Skin Assessment (not related to wound area) ASSESSMENTS - Ostomy and/or Continence Assessment and Care []  - 0 Incontinence Assessment and Management []  - 0 Ostomy Care Assessment and Management (repouching, etc.) PROCESS - Coordination of Care []  - 0 Simple Patient / Family Education for ongoing care X- 1 20 Complex (extensive) Patient / Family Education for ongoing care X- 1 10 Staff obtains Programmer, systems, Records, T Results / Process Orders est []  - 0 Staff telephones HHA, Nursing Homes / Clarify orders / etc []  - 0 Routine Transfer to another Facility (non-emergent condition) []  - 0 Routine Hospital Admission (non-emergent condition) []  - 0 Wayne Admissions / Biomedical engineer / Ordering NPWT Apligraf, etc. , []  - 0 Emergency Hospital Admission (emergent condition) PROCESS - Special Needs []  - 0 Pediatric / Minor Patient Management []  - 0 Isolation Patient Management []  - 0 Hearing / Language / Visual special needs []  - 0 Assessment of Community assistance (transportation, D/C planning, etc.) []  - 0 Additional assistance / Altered mentation []  - 0 Support Surface(s) Assessment (bed, cushion, seat, etc.) INTERVENTIONS - Miscellaneous []  - 0 External ear exam []  - 0 Patient Transfer (multiple staff / Civil Service fast streamer / Similar devices) []  -  0 Simple Staple / Suture removal (25 or less) []  - 0 Complex Staple / Suture removal (26 or more) []  - 0 Hypo/Hyperglycemic Management (do not check if billed separately) []  - 0 Ankle / Brachial Index (ABI) - do not check if billed separately Has the patient been seen  at the hospital within the last three years: Yes Total Score: 75 Level Of Care: Wayne/Established - Level 2 Electronic Signature(s) Signed: 12/28/2021 4:30:21 PM By: Lorrin Jackson Entered By: Lorrin Jackson on 12/28/2021 09:31:54 -------------------------------------------------------------------------------- Encounter Discharge Information Details Patient Name: Date of Service: Wayne [name], Wayne [name] 12/28/2021 8:00 Wayne M Medical Record Number: [id number] Patient Account Number: [id number] Date of Birth/Sex: Treating RN: [birth year] (81 y.o. [name] Primary Care Takaya Hyslop: PA Haig Prophet, NO Other Clinician: Referring Whalen Trompeter: Treating Miciah Shealy/Extender: Yaakov Guthrie in Treatment: 0 Encounter Discharge Information Items Post Procedure Vitals Discharge Condition: Stable Temperature (F): 98.2 Ambulatory Status: Wheelchair Pulse (bpm): 80 Discharge Destination: Home Respiratory Rate (breaths/min): 16 Transportation: Private Auto Blood Pressure (mmHg): 156/73 Accompanied By: Daughter Schedule Follow-up Appointment: Yes Clinical Summary of Care: Provided on 12/28/2021 Form Type Recipient Paper Patient Patient Electronic Signature(s) Signed: 12/28/2021 4:30:21 PM By: Lorrin Jackson Entered By: Lorrin Jackson on 12/28/2021 09:33:06 -------------------------------------------------------------------------------- Lower Extremity Assessment Details Patient Name: Date of Service: Wayne [name], Wayne [name] 12/28/2021 8:00 Wayne M Medical Record Number: [id number] Patient Account Number: [id number] Date of Birth/Sex: Treating RN: [birth year] (81 y.o. [name] Primary Care Shanelle Clontz: PA TIENT, NO Other  Clinician: Referring Tamea Bai: Treating Kailana Benninger/Extender: Yaakov Guthrie in Treatment: 0 Edema Assessment Assessed: [Left: No] [Right: Yes] Edema: [Left: N] [Right: o] Calf Left: Right: [name] of Measurement: 34 cm From Medial Instep 32.2 cm Ankle Left: Right: [name] of Measurement: 11 cm From Medial Instep 22 cm Knee To Floor Left: Right: From Medial Instep 41 cm Vascular Assessment Pulses: Dorsalis Pedis Palpable: [Right:Yes] Electronic Signature(s) Signed: 12/28/2021 4:30:21 PM By: Lorrin Jackson Entered By: Lorrin Jackson on 12/28/2021 08:34:13 -------------------------------------------------------------------------------- Multi Wound Chart Details Patient Name: Date of Service: [place], Wayne [name] 12/28/2021 8:00 Wayne M Medical Record Number: [id number] Patient Account Number: [id number] Date of Birth/Sex: Treating RN: 1940/06/03 (81 y.o. 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: Extremity Cataracts, Chronic Obstructive N/Wayne N/Wayne Comorbid History: Pulmonary Disease (COPD), Congestive Heart Failure, Peripheral Arterial Disease, Type II Diabetes, Osteoarthritis, Neuropathy 11/16/2021 N/Wayne N/Wayne Date Acquired: 0 N/Wayne N/Wayne Weeks of Treatment: Open N/Wayne N/Wayne Wound Status: No N/Wayne N/Wayne Wound Recurrence: 2.8x3.3x0.1 N/Wayne N/Wayne Measurements L x W x D (cm) 7.257 N/Wayne N/Wayne Wayne (cm) : rea 0.726 N/Wayne N/Wayne Volume (cm) : Grade 1 N/Wayne N/Wayne Classification: Medium N/Wayne N/Wayne Exudate Wayne mount: Serosanguineous N/Wayne  N/Wayne Exudate Type: red, brown N/Wayne N/Wayne Exudate Color: Distinct, outline attached N/Wayne N/Wayne Wound Margin: Small (1-33%) N/Wayne N/Wayne Granulation Wayne mount: Red N/Wayne N/Wayne Granulation Quality: Large (67-100%) N/Wayne N/Wayne Necrotic Wayne mount: Eschar N/Wayne N/Wayne Necrotic Tissue: Fat Layer (Subcutaneous Tissue): Yes N/Wayne N/Wayne Exposed Structures: Fascia: No Tendon: No Muscle: No Joint: No Bone: No None N/Wayne N/Wayne Epithelialization: Chemical/Enzymatic/Mechanical N/Wayne N/Wayne Debridement: Pre-procedure Verification/Time Out 09:04 N/Wayne N/Wayne Taken: Lidocaine 4% Topical Solution N/Wayne N/Wayne Pain Control: N/Wayne N/Wayne N/Wayne Instrument: None N/Wayne N/Wayne Bleeding: Procedure was tolerated well N/Wayne N/Wayne Debridement Treatment Response: 2.8x3.3x0.1 N/Wayne N/Wayne Post Debridement Measurements L x W x D (cm) 0.726 N/Wayne N/Wayne Post Debridement Volume: (cm) Debridement N/Wayne N/Wayne Procedures Performed: Treatment Notes Wound #1 (Toe Second) Wound  Laterality: Right Cleanser Soap and Water Discharge Instruction: May shower and wash wound with dial antibacterial soap and water prior to dressing change. Peri-Wound Care Topical Primary Dressing Santyl Ointment Discharge Instruction: Apply nickel thick amount to wound bed as instructed Secondary Dressing Woven Gauze Sponge, Non-Sterile 4x4 in Discharge Instruction: Apply over primary dressing as directed. Secured With [employer] Surgical T 2x10 (in/yd) ape Discharge Instruction: Secure with tape as directed. Compression Wrap Compression Stockings Add-Ons Electronic Signature(s) Signed: 12/28/2021 1:44:59 PM By: Kalman Shan DO Entered By: Kalman Shan on 12/28/2021 09:36:03 -------------------------------------------------------------------------------- Multi-Disciplinary Care Plan Details Patient Name: Date of Service: Wayne [name], Wayne [name] 12/28/2021 8:00 Wayne M Medical Record Number: [id number] Patient Account Number: [id number] Date of Birth/Sex: Treating RN: 15-Apr-1940  (81 y.o. [name] Primary Care Brysun Eschmann: PA Haig Prophet, NO Other Clinician: Referring Emmanuelle Hibbitts: Treating Zhania Shaheen/Extender: Yaakov Guthrie in Treatment: 0 Active Inactive Tissue Oxygenation Nursing Diagnoses: Actual ineffective tissue perfusion; peripheral (select once diagnosis is confirmed) Goals: Non-invasive arterial studies are completed as ordered Date Initiated: 12/28/2021 Target Resolution Date: 12/18/2021 Goal Status: Active Interventions: Assess patient understanding of disease process and management upon diagnosis and as needed Assess peripheral arterial status upon admission and as needed Notes: Wound/Skin Impairment Nursing Diagnoses: Impaired tissue integrity Goals: Patient/caregiver will verbalize understanding of skin care regimen Date Initiated: 12/28/2021 Target Resolution Date: 01/25/2022 Goal Status: Active Ulcer/skin breakdown will have Wayne volume reduction of 30% by week 4 Date Initiated: 12/28/2021 Target Resolution Date: 01/25/2022 Goal Status: Active Interventions: Assess patient/caregiver ability to obtain necessary supplies Assess patient/caregiver ability to perform ulcer/skin care regimen upon admission and as needed Assess ulceration(s) every visit Provide education on ulcer and skin care Treatment Activities: Topical wound management initiated : 12/28/2021 Notes: Electronic Signature(s) Signed: 12/28/2021 4:30:21 PM By: Lorrin Jackson Entered By: Lorrin Jackson on 12/28/2021 08:38:54 -------------------------------------------------------------------------------- Pain Assessment Details Patient Name: Date of Service: Wayne [name], Wayne [name] 12/28/2021 8:00 Wayne M Medical Record Number: [id number] Patient Account Number: [id number] Date of Birth/Sex: Treating RN: [birth year] (81 y.o. [name] Primary Care Veronda Gabor: PA Haig Prophet, NO Other Clinician: Referring Lili Harts: Treating Latania Bascomb/Extender: Yaakov Guthrie in Treatment:  0 Active Problems Location of Pain Severity and Description of Pain Patient Has Paino No Site Locations Pain Management and Medication Current Pain Management: Electronic Signature(s) Signed: 12/28/2021 4:30:21 PM By: Lorrin Jackson Entered By: Lorrin Jackson on 12/28/2021 08:37:48 -------------------------------------------------------------------------------- Patient/Caregiver Education Details Patient Name: Date of Service: Wayne [name], Wayne [name] 10/2/2023andnbsp8:00 Wayne M Medical Record Number: [id number] Patient Account Number: [id number] Date of Birth/Gender: Treating RN: 09-17-1940 (81 y.o. [name] Primary Care Physician: PA Haig Prophet, NO Other Clinician: Referring Physician: Treating Physician/Extender: Yaakov Guthrie in Treatment: 0 Education Assessment Education Provided To: Patient and Caregiver Education Topics Provided Wound Debridement: Methods: Explain/Verbal Responses: State content correctly Wound/Skin Impairment: Methods: Demonstration, Explain/Verbal, Printed Responses: State content correctly Electronic Signature(s) Signed: 12/28/2021 4:30:21 PM By: Lorrin Jackson Entered By: Lorrin Jackson on 12/28/2021 08:39:11 -------------------------------------------------------------------------------- Wound Assessment Details Patient Name: Date of Service: Wayne [name], Wayne [name] 12/28/2021 8:00 Wayne M Medical Record Number: [id number] Patient Account Number: [id number] Date of Birth/Sex: Treating RN: 07/09/1940 (81 y.o. [name] Primary Care Irie Dowson: PA Haig Prophet, NO Other Clinician: Referring Aza Dantes: Treating Rema Lievanos/Extender: Yaakov Guthrie in Treatment: 0 Wound Status Wound Number: 1 Primary Diabetic Wound/Ulcer of the Lower Extremity Etiology: Wound Location: Right T Second oe Wound Open Wounding Event: Trauma Status: Date Acquired: 11/16/2021 Comorbid Cataracts, Chronic Obstructive Pulmonary Disease (  COPD), Weeks Of  Treatment: 0 History: Congestive Heart Failure, Peripheral Arterial Disease, Type II Clustered Wound: No Diabetes, Osteoarthritis, Neuropathy Photos Wound Measurements Length: (cm) 2.8 Width: (cm) 3.3 Depth: (cm) 0.1 Area: (cm) 7.257 Volume: (cm) 0.726 % Reduction in Area: % Reduction in Volume: Epithelialization: None Tunneling: No Undermining: No Wound Description Classification: Grade 1 Wound Margin: Distinct, outline attached Exudate Amount: Medium Exudate Type: Serosanguineous Exudate Color: red, brown Foul Odor After Cleansing: No Slough/Fibrino No Wound Bed Granulation Amount: Small (1-33%) Exposed Structure Granulation Quality: Red Fascia Exposed: No Necrotic Amount: Large (67-100%) Fat Layer (Subcutaneous Tissue) Exposed: Yes Necrotic Quality: Eschar Tendon Exposed: No Muscle Exposed: No Joint Exposed: No Bone Exposed: No Treatment Notes Wound #1 (Toe Second) Wound Laterality: Right Cleanser Soap and Water Discharge Instruction: May shower and wash wound with dial antibacterial soap and water prior to dressing change. Peri-Wound Care Topical Primary Dressing Santyl Ointment Discharge Instruction: Apply nickel thick amount to wound bed as instructed Secondary Dressing Woven Gauze Sponge, Non-Sterile 4x4 in Discharge Instruction: Apply over primary dressing as directed. Secured With [employer] Surgical T 2x10 (in/yd) ape Discharge Instruction: Secure with tape as directed. Compression Wrap Compression Stockings Add-Ons Electronic Signature(s) Signed: 12/28/2021 4:30:21 PM By: Lorrin Jackson Entered By: Lorrin Jackson on 12/28/2021 08:41:29 -------------------------------------------------------------------------------- Vitals Details Patient Name: Date of Service: Wayne [name], Wayne [name] 12/28/2021 8:00 Wayne M Medical Record Number: [id number] Patient Account Number: [id number] Date of Birth/Sex: Treating RN: January 28, 1941 (81 y.o. [name] Primary Care Mouhamed Glassco: PA Haig Prophet, NO Other Clinician: Referring Shineka Auble: Treating Evalyse Stroope/Extender: Yaakov Guthrie in Treatment: 0 Vital Signs Time Taken: 08:19 Temperature (F): 98.2 Height (in): 72 Pulse (bpm): 80 Source: Stated Respiratory Rate (breaths/min): 16 Weight (lbs): 180 Blood Pressure (mmHg): 156/73 Source: Stated Capillary Blood Glucose (mg/dl): 134 Body Mass Index (BMI): 24.4 Reference Range: 80 - 120 mg / dl Electronic Signature(s) Signed: 12/28/2021 4:30:21 PM By: Lorrin Jackson Entered By: Lorrin Jackson on 12/28/2021 08:21:34

## 2021-12-28 ENCOUNTER — Telehealth: Payer: Self-pay

## 2021-12-28 ENCOUNTER — Telehealth: Payer: Self-pay | Admitting: Podiatry

## 2021-12-28 ENCOUNTER — Encounter (HOSPITAL_BASED_OUTPATIENT_CLINIC_OR_DEPARTMENT_OTHER): Payer: Medicare Other | Attending: Internal Medicine | Admitting: Internal Medicine

## 2021-12-28 DIAGNOSIS — Z87891 Personal history of nicotine dependence: Secondary | ICD-10-CM | POA: Diagnosis not present

## 2021-12-28 DIAGNOSIS — L97518 Non-pressure chronic ulcer of other part of right foot with other specified severity: Secondary | ICD-10-CM | POA: Diagnosis not present

## 2021-12-28 DIAGNOSIS — I70245 Atherosclerosis of native arteries of left leg with ulceration of other part of foot: Secondary | ICD-10-CM | POA: Diagnosis not present

## 2021-12-28 DIAGNOSIS — E11621 Type 2 diabetes mellitus with foot ulcer: Secondary | ICD-10-CM | POA: Diagnosis present

## 2021-12-28 NOTE — Telephone Encounter (Signed)
Attempted to contact patient to schedule a Palliative Care consult appointment. No answer left a message to return call.  

## 2021-12-28 NOTE — Progress Notes (Signed)
Wayne [name] ([id number]) Visit Report for 12/28/2021 Chief Complaint Document Details Patient Name: Date of Service: Wayne [name] Wayne [name] 12/28/2021 8:00 [name] M Medical Record Number: [id number] Patient Account Number: [id number] Date of Birth/Sex: Treating RN: [birth year][name] (81 y.o. M) Primary Care Provider: PA Haig [name], NO Other Clinician: Referring Provider: Treating Provider/Extender: Wayne [name] in Treatment: 0 Information Obtained from: Patient Chief Complaint 12/28/2021; right second toe wound Electronic Signature(s) Signed: 12/28/2021 1:44:59 PM By: Wayne Shan DO Entered By: Wayne [name] on 12/28/2021 09:36:30 -------------------------------------------------------------------------------- Debridement Details Patient Name: Date of Service: Wayne [name] 12/28/2021 8:00 [name] M Medical Record Number: [id number] Patient Account Number: [id number] Date of Birth/Sex: Treating RN: Wayne [name] (81 y.o. [name] Primary Care Provider: PA Haig [name], NO Other Clinician: Referring Provider: Treating Provider/Extender: Wayne [name] in Treatment: 0 Debridement Performed for Assessment: Wound #1 Right T Second oe Performed By: Physician Wayne Shan, DO Debridement Type: Chemical/Enzymatic/Mechanical Agent Used: Santyl Severity of Tissue Pre Debridement: Fat layer exposed Level of Consciousness (Pre-procedure): Awake and Alert Pre-procedure Verification/Time Out Yes - 09:04 Taken: Start Time: 09:05 Pain Control: Lidocaine 4% Topical Solution Bleeding: None End Time: 09:07 Response to Treatment: Procedure was tolerated well Level of Consciousness (Post- Awake and Alert procedure): Post Debridement Measurements of Total Wound Length: (cm) 2.8 Width: (cm) 3.3 Depth: (cm) 0.1 Volume: (cm) 0.726 Character of Wound/Ulcer Post Debridement: Stable Severity of Tissue Post Debridement: Fat layer exposed Post Procedure Diagnosis Same as  Pre-procedure Electronic Signature(s) Signed: 12/28/2021 1:44:59 PM By: Wayne Shan DO Signed: 12/28/2021 4:30:21 PM By: Wayne [name] Entered By: Wayne [name] on 12/28/2021 09:10:08 -------------------------------------------------------------------------------- HPI Details Patient Name: Date of Service: Wayne [name] 12/28/2021 8:00 [name] M Medical Record Number: [id number] Patient Account Number: [id number] Date of Birth/Sex: Treating RN: [birth year][name] (81 y.o. M) Primary Care Provider: PA Haig [name], NO Other Clinician: Referring Provider: Treating Provider/Extender: Wayne [name] in Treatment: 0 History of Present Illness HPI Description: 12/28/2021 Mr. Wayne [name] is an 81 year old male with uncontrolled type 2 diabetes with last hemoglobin A1c of 9.4, peripheral arterial disease with last ABIs that were noncompressible and TBI of 0.43 on the right done on 12/18/2021 that presents with [name] several month history of nonhealing ulcer to the second right toe. Apparently he had kicked [name] tire to see if there was air in it and he developed [name] blister to this toe. He was seen by vein and vascular who recommended amputation. He was subsequently referred to podiatry, Wayne [name] for this procedure. He also recommended amputation however patient and daughter would like to salvage the second right toe. He is currently on Augmentin. He denies systemic signs of infection. He has been using Betadine to the wound bed. Electronic Signature(s) Signed: 12/28/2021 1:44:59 PM By: Wayne Shan DO Entered By: Wayne [name] on 12/28/2021 10:09:53 -------------------------------------------------------------------------------- Physical Exam Details Patient Name: Date of Service: Wayne [name] 12/28/2021 8:00 [name] M Medical Record Number: [id number] Patient Account Number: [id number] Date of Birth/Sex: Treating RN: [name] (81 y.o. M) Primary Care Provider: PA Haig [name], NO Other  Clinician: Referring Provider: Treating Provider/Extender: Wayne [name] in Treatment: 0 Constitutional respirations regular, non-labored and within target range for patient.. Cardiovascular 2+ dorsalis pedis/posterior tibialis pulses. Psychiatric pleasant and cooperative. Notes Right foot: T the second toe there is necrotic debris circumferentially to the middle aspect. No increased warmth or erythema to the surrounding tissue. o Electronic Signature(s) Signed: 12/28/2021 1:44:59 PM By: Wayne Shan DO Entered By: Wayne [name] on  12/28/2021 10:10:34 -------------------------------------------------------------------------------- Physician Orders Details Patient Name: Date of Service: Wayne [name] 12/28/2021 8:00 [name] M Medical Record Number: [id number] Patient Account Number: [id number] Date of Birth/Sex: Treating RN: [birth year] (81 y.o. 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 - with Dr. Heber [name] and Wayne Anna, RN (Room 7) Return [name] Anesthetic (In clinic) Topical Lidocaine 5% applied to wound bed (In clinic) Topical Lidocaine 4% applied to wound bed Bathing/ Shower/ Hygiene May shower and wash wound with soap and water. Edema Control - Lymphedema / SCD / Other Elevate legs to the level of the heart or above for 30 minutes daily and/or when sitting, [name] frequency of: - throughout the day Avoid standing for long periods of time. Additional Orders / Instructions Follow Nutritious Diet - Monitor/Control Blood  Sugar Wound Treatment Wound #1 - T Second oe Wound Laterality: Right Cleanser: Soap and Water 1 x Per Day/30 Days Discharge Instructions: May shower and wash wound with dial antibacterial soap and water prior to dressing change. Prim Dressing: Santyl Ointment 1 x Per Day/30 Days ary Discharge Instructions: Apply nickel thick amount to wound bed as instructed Secondary Dressing: Woven Gauze Sponge, Non-Sterile 4x4 in (DME) (Generic) 1 x Per Day/30 Days Discharge Instructions: Apply over primary dressing as directed. Secured With: 67M Medipore Public affairs consultant Surgical T 2x10 (in/yd) (DME) (Generic) 1 x Per Day/30 Days ape Discharge Instructions: Secure with tape as directed. Patient Medications llergies: No Known Allergies [name] Notifications Medication Indication Start End 12/28/2021 Santyl DOSE 1 - topical 250 unit/gram ointment - Apply once daily to the wound bed 12/28/2021 amoxicillin-pot clavulanate DOSE 1 - oral 875 mg-125 mg tablet - 1 tablet oral BID x 14 days 12/28/2021 doxycycline hyclate DOSE 1 - oral 100 mg tablet - 1 tablet oral BID x 14 days Electronic Signature(s) Signed: 12/28/2021 1:44:59 PM By: Wayne Shan DO Previous Signature: 12/28/2021 9:35:09 AM Version By: Wayne Shan DO Previous Signature: 12/28/2021 9:32:13 AM Version By: Wayne Shan DO Previous Signature: 12/28/2021 9:16:29 AM Version By: Wayne Shan DO Entered By: Wayne [name] on 12/28/2021 10:10:44 -------------------------------------------------------------------------------- Problem List Details Patient Name: Date of Service: [place], [name] Wayne [name] 12/28/2021 8:00 [name] M Medical Record Number: [id number] Patient Account Number: [id number] Date of Birth/Sex: Treating RN: [name] (81 y.o. M) Primary Care Provider: PA TIENT, NO Other Clinician: Referring Provider: Treating Provider/Extender: Wayne [name] in Treatment: 0 Active Problems ICD-10 Encounter Code Description Active  Date MDM Diagnosis I70.245 Atherosclerosis of native arteries of left leg with ulceration of other part of 12/28/2021 No Yes foot L97.518 Non-pressure chronic ulcer of other part of right foot with other specified 12/28/2021 No Yes severity E11.621 Type 2 diabetes mellitus with foot ulcer 12/28/2021 No Yes Inactive Problems Resolved Problems Electronic Signature(s) Signed: 12/28/2021 1:44:59 PM By: Wayne Shan DO Entered By: Wayne [name] on 12/28/2021 09:35:56 -------------------------------------------------------------------------------- Progress Note Details Patient Name: Date of Service: [place], [name] Wayne [name] 12/28/2021 8:00 [name] M Medical Record Number: [id number] Patient Account Number: [id number] Date of Birth/Sex: Treating RN: [name] (81 y.o. M) Primary Care Provider: PA Haig [name], NO Other Clinician: Referring Provider: Treating Provider/Extender: Wayne [place],  Loni Muse in Treatment: 0 Subjective Chief Complaint Information obtained from Patient 12/28/2021; right second toe wound History of Present Illness (HPI) 12/28/2021 Mr. Wayne [name] is an 81 year old male with uncontrolled type 2 diabetes with last hemoglobin A1c of 9.4, peripheral arterial disease with last ABIs that were noncompressible and TBI of 0.43 on the right done on 12/18/2021 that presents with [name] several month history of nonhealing ulcer to the second right toe. Apparently he had kicked [name] tire to see if there was air in it and he developed [name] blister to this toe. He was seen by vein and vascular who recommended amputation. He was subsequently referred to podiatry, Wayne [name] for this procedure. He also recommended amputation however patient and daughter would like to salvage the second right toe. He is currently on Augmentin. He denies systemic signs of infection. He has been using Betadine to the wound bed. Patient History Information obtained from Patient, Chart. Allergies No Known Allergies Family  History Unknown History, Cancer - Mother, No family history of Diabetes, Heart Disease, Hereditary Spherocytosis, Hypertension, Kidney Disease. Social History Former smoker - Quit 30 years ago, Marital Status - Divorced, Alcohol Use - Never, Drug Use - No History, Caffeine Use - Daily. Medical History Eyes Patient has history of Cataracts Respiratory Patient has history of Chronic Obstructive Pulmonary Disease (COPD) Cardiovascular Patient has history of Congestive Heart Failure, Peripheral Arterial Disease Denies history of Coronary Artery Disease Endocrine Patient has history of Type II Diabetes Musculoskeletal Patient has history of Osteoarthritis Neurologic Patient has history of Neuropathy Patient is treated with Oral Agents. Blood sugar is tested. Medical [name] Surgical History Notes nd Genitourinary CKD-3 Review of Systems (ROS) Eyes Complains or has symptoms of Glasses / Contacts. Ear/Nose/Mouth/Throat Denies complaints or symptoms of Chronic sinus problems or rhinitis. Gastrointestinal Denies complaints or symptoms of Frequent diarrhea, Nausea, Vomiting. Integumentary (Skin) Complains or has symptoms of Wounds. Objective Constitutional respirations regular, non-labored and within target range for patient.. Vitals Time Taken: 8:19 AM, Height: 72 in, Source: Stated, Weight: 180 lbs, Source: Stated, BMI: 24.4, Temperature: 98.2 F, Pulse: 80 bpm, Respiratory Rate: 16 breaths/min, Blood Pressure: 156/73 mmHg, Capillary Blood Glucose: 134 mg/dl. Cardiovascular 2+ dorsalis pedis/posterior tibialis pulses. Psychiatric pleasant and cooperative. General Notes: Right foot: T the second toe there is necrotic debris circumferentially to the middle aspect. No increased warmth or erythema to the o surrounding tissue. Integumentary (Hair, Skin) Wound #1 status is Open. Original cause of wound was Trauma. The date acquired was: 11/16/2021. The wound is located on the Right T  Second. The wound oe measures 2.8cm length x 3.3cm width x 0.1cm depth; 7.257cm^2 area and 0.726cm^3 volume. There is Fat Layer (Subcutaneous Tissue) exposed. There is no tunneling or undermining noted. There is [name] medium amount of serosanguineous drainage noted. The wound margin is distinct with the outline attached to the wound base. There is small (1-33%) red granulation within the wound bed. There is [name] large (67-100%) amount of necrotic tissue within the wound bed including Eschar. 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 Amputation has been recommended by vein and vascular as well as podiatry. T oday we discussed  the low probability of this toe healing. Patient knows he is at risk of infection and potential limb loss from this situation. Due to this risk I recommended continuing oral antibiotics and will prescribe at least 2 more weeks Of Augmentin and will add doxycycline. For now I recommended Santyl to the wound bed. I recommended doing [name] trial for 2 to 4 weeks of wound care and if no improvement or bone is exposed then I recommended amputation. Follow-up in 1 week. Procedures Wound #1 Pre-procedure diagnosis of Wound #1 is [name] Diabetic Wound/Ulcer of the Lower Extremity located on the Right T Second .Severity of Tissue Pre Debridement oe is: Fat layer exposed. There was [name] Chemical/Enzymatic/Mechanical debridement performed by Wayne Shan, DO. after achieving pain control using Lidocaine 4% T opical Solution. Agent used was [employer]. [name] time out was conducted at 09:04, prior to the start of the procedure. There  was no bleeding. The procedure was tolerated well. Post Debridement Measurements: 2.8cm length x 3.3cm width x 0.1cm depth; 0.726cm^3 volume. Character of Wound/Ulcer Post Debridement is stable. Severity of Tissue Post Debridement is: Fat layer exposed. Post procedure Diagnosis Wound #1: Same as Pre-Procedure Plan Follow-up Appointments: Return Appointment in 1 week. - with Dr. Heber [place] and Wayne Anna, RN (Room 7) Anesthetic: (In clinic) Topical Lidocaine 5% applied to wound bed (In clinic) Topical Lidocaine 4% applied to wound bed Bathing/ Shower/ Hygiene: May shower and wash wound with soap and water. Edema Control - Lymphedema / SCD / Other: Elevate legs to the level of the heart or above for 30 minutes daily and/or when sitting, [name] frequency of: - throughout the day Avoid standing for long periods of time. Additional Orders / Instructions: Follow Nutritious Diet - Monitor/Control Blood Sugar The following medication(s) was prescribed: Santyl topical 250 unit/gram ointment 1 Apply once daily to the wound bed starting 12/28/2021 amoxicillin-pot clavulanate oral 875 mg-125 mg tablet 1 1 tablet oral BID x 14 days starting 12/28/2021 doxycycline hyclate oral 100 mg tablet 1 1 tablet oral BID x 14 days starting 12/28/2021 WOUND #1: - T Second Wound Laterality: Right oe Cleanser: Soap and Water 1 x Per Day/30 Days Discharge Instructions: May shower and wash wound with dial antibacterial soap and water prior to dressing change. Prim Dressing: Santyl Ointment 1 x Per Day/30 Days ary Discharge Instructions: Apply nickel thick amount to wound bed as instructed Secondary Dressing: Woven Gauze Sponge, Non-Sterile 4x4 in (DME) (Generic) 1 x Per Day/30 Days Discharge Instructions: Apply over primary dressing as directed. Secured With: 89M Medipore Public affairs consultant Surgical T 2x10 (in/yd) (DME) (Generic) 1 x Per Day/30 Days ape Discharge Instructions: Secure with tape as directed. 1. Santyl 2. Augmentin and  doxycycline 3. Follow-up in 1 week Electronic Signature(s) Signed: 12/28/2021 1:44:59 PM By: Wayne Shan DO Entered By: Wayne [name] on 12/28/2021 10:15:24 -------------------------------------------------------------------------------- HxROS Details Patient Name: Date of Service: [place], [name] Wayne [name] 12/28/2021 8:00 [name] M Medical Record Number: [id number] Patient Account Number: [id number] Date of Birth/Sex: Treating RN: [birth year] [name] (81 y.o. [name] Primary Care Provider: PA Haig [name], NO Other Clinician: Referring Provider: Treating Provider/Extender: Wayne [name] in Treatment: 0 Information Obtained From Patient Chart Eyes Complaints and Symptoms: Positive for: Glasses / Contacts Medical History: Positive for: Cataracts Ear/Nose/Mouth/Throat Complaints and Symptoms: Negative for: Chronic sinus problems or rhinitis Gastrointestinal Complaints and Symptoms: Negative for: Frequent diarrhea; Nausea; Vomiting Integumentary (Skin) Complaints and Symptoms: Positive for: Wounds Hematologic/Lymphatic Respiratory Medical History: Positive for: Chronic Obstructive Pulmonary Disease (COPD) Cardiovascular Medical History:  Positive for: Congestive Heart Failure; Peripheral Arterial Disease Negative for: Coronary Artery Disease Endocrine Medical History: Positive for: Type II Diabetes Time with diabetes: Since 2009 Treated with: Oral agents Blood sugar tested every day: Yes Tested : daily Genitourinary Medical History: Past Medical History Notes: CKD-3 Immunological Musculoskeletal Medical History: Positive for: Osteoarthritis Neurologic Medical History: Positive for: Neuropathy Oncologic HBO Extended History Items Eyes: Cataracts Immunizations Pneumococcal Vaccine: Received Pneumococcal Vaccination: No Implantable Devices None Family and Social History Unknown History: Yes; Cancer: Yes - Mother; Diabetes: No; Heart Disease: No; Hereditary  Spherocytosis: No; Hypertension: No; Kidney Disease: No; Former smoker - Quit 30 years ago; Marital Status - Divorced; Alcohol Use: Never; Drug Use: No History; Caffeine Use: Daily; Financial Concerns: No; Food, Clothing or Shelter Needs: No; Support System Lacking: No; Transportation Concerns: No Electronic Signature(s) Signed: 12/28/2021 1:44:59 PM By: Wayne Shan DO Signed: 12/28/2021 4:30:21 PM By: Wayne [name] Entered By: Wayne [name] on 12/28/2021 08:25:29 -------------------------------------------------------------------------------- [place] Details Patient Name: Date of Service: [place], [name] Wayne [name] 12/28/2021 Medical Record Number: [id number] Patient Account Number: [id number] Date of Birth/Sex: Treating RN: [birth year] [name] (81 y.o. 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 E11.621 Type 2 diabetes mellitus with foot ulcer Facility Procedures CPT4 Code: [id number] Description: [phone] - WOUND CARE VISIT-LEV 2 EST PT Modifier: 25 Quantity: 1 CPT4 Code: [id number] Description: [id number] - DEBRIDE W/O ANES NON SELECT ICD-10 Diagnosis Description L97.518 Non-pressure chronic ulcer of other part of right foot with other specified sever Modifier: ity Quantity: 1 Physician Procedures : CPT4 Code Description Modifier [id number] - WC PHYS LEVEL 4 - NEW PT ICD-10 Diagnosis Description L97.518 Non-pressure chronic ulcer of other part of right foot with other specified severity I70.245 Atherosclerosis of native arteries of left leg  with ulceration of other part of foot E11.621 Type 2 diabetes mellitus with foot ulcer Quantity: 1 Electronic  Signature(s) Signed: 12/28/2021 1:44:59 PM By: Wayne Shan DO Entered By: Wayne [name] on 12/28/2021 10:15:46

## 2021-12-28 NOTE — Telephone Encounter (Signed)
Notified pts daughter that per Dr Jacqualyn Posey as long has he is being followed by woundcare he does not have to come see him unless he wanting too or needs to. We canceled the appt for tomorrow. The daughter said they were going to continue wound care follow up next week and will call us if they feel they need to see you if woundcare is not healing it.

## 2021-12-28 NOTE — Telephone Encounter (Signed)
Kenney Houseman [facility] home healthcare called and stated patient refused his appointment and wanted to r/s for Wednesday. She was calling as a FYI  If anyone needs to call her [phone]

## 2021-12-28 NOTE — Telephone Encounter (Signed)
Pts daughter called and pt is starting wound care and is scheduled to see you tomorrow does he need to keep appt with you. Pts daugheter stated it is hard for him to get down the stairs and if needed she could meet with you for the appt. ( I did explain pt would have to be at appt as well) They were seen at wound care today and have a follow up with them 10.10.2023

## 2021-12-28 NOTE — Progress Notes (Signed)
YUCHEN, FEDOR ([id number]) Visit Report for 12/28/2021 Abuse Risk Screen Details Patient Name: Date of Service: Jimmie Molly LFRED 12/28/2021 8:00 A M Medical Record Number: [id number] Patient Account Number: [id number] Date of Birth/Sex: Treating RN: 08/03/1940 (81 y.o. Lytle Michaels Primary Care Marielena Harvell: PA Zenovia Jordan, NO Other Clinician: Referring Bertha Lokken: Treating Chayson Charters/Extender: Tilda Franco in Treatment: 0 Abuse Risk Screen Items Answer ABUSE RISK SCREEN: Has anyone close to you tried to hurt or harm you recentlyo No Do you feel uncomfortable with anyone in your familyo No Has anyone forced you do things that you didnt want to doo No Electronic Signature(s) Signed: 12/28/2021 4:30:21 PM By: Antonieta Iba Entered By: Antonieta Iba on 12/28/2021 08:25:38 -------------------------------------------------------------------------------- Activities of Daily Living Details Patient Name: Date of Service: Jimmie Molly LFRED 12/28/2021 8:00 A M Medical Record Number: [id number] Patient Account Number: [id number] Date of Birth/Sex: Treating RN: 1940-12-13 (81 y.o. Lytle Michaels Primary Care Kambry Takacs: PA Zenovia Jordan, NO Other Clinician: Referring Jeanell Mangan: Treating Makisha Marrin/Extender: Tilda Franco in Treatment: 0 Activities of Daily Living Items Answer Activities of Daily Living (Please select one for each item) Drive Automobile Not Able T Medications ake Need Assistance Use T elephone Need Assistance Care for Appearance Need Assistance Use T oilet Completely Able Bath / Shower Need Assistance Dress Self Need Assistance Feed Self Completely Able Walk Need Assistance Get In / Out Bed Completely Able Housework Need Assistance Prepare Meals Need Assistance Handle Money Need Assistance Shop for Self Need Assistance Electronic Signature(s) Signed: 12/28/2021 4:30:21 PM By: Antonieta Iba Entered By: Antonieta Iba on 12/28/2021  08:26:31 -------------------------------------------------------------------------------- Education Screening Details Patient Name: Date of Service: MO Elmon Else, A LFRED 12/28/2021 8:00 A M Medical Record Number: [id number] Patient Account Number: [id number] Date of Birth/Sex: Treating RN: 02-01-1941 (81 y.o. Lytle Michaels Primary Care Ranee Peasley: PA Zenovia Jordan, NO Other Clinician: Referring Rasul Decola: Treating Britt Petroni/Extender: Tilda Franco in Treatment: 0 Primary Learner Assessed: Patient Learning Preferences/Education Level/Primary Language Learning Preference: Explanation, Demonstration, Printed Material Highest Education Level: High School Preferred Language: English Cognitive Barrier Language Barrier: No Translator Needed: No Memory Deficit: Yes Emotional Barrier: No Cultural/Religious Beliefs Affecting Medical Care: No Physical Barrier Impaired Vision: Yes Glasses Impaired Hearing: No Decreased Hand dexterity: No Knowledge/Comprehension Knowledge Level: High Comprehension Level: High Ability to understand written instructions: High Ability to understand verbal instructions: High Motivation Anxiety Level: Calm Cooperation: Cooperative Education Importance: Acknowledges Need Interest in Health Problems: Asks Questions Perception: Coherent Willingness to Engage in Self-Management High Activities: Readiness to Engage in Self-Management High Activities: Electronic Signature(s) Signed: 12/28/2021 4:30:21 PM By: Antonieta Iba Entered By: Antonieta Iba on 12/28/2021 08:27:19 -------------------------------------------------------------------------------- Fall Risk Assessment Details Patient Name: Date of Service: MO O REFIELD, A LFRED 12/28/2021 8:00 A M Medical Record Number: [id number] Patient Account Number: [id number] Date of Birth/Sex: Treating RN: [birth year] (80 y.o. Lytle Michaels Primary Care Alliene Klugh: PA Zenovia Jordan, NO Other Clinician: Referring  Jarid Sasso: Treating Akilah Cureton/Extender: Tilda Franco in Treatment: 0 Fall Risk Assessment Items Have you had 2 or more falls in the last 12 monthso 0 No Have you had any fall that resulted in injury in the last 12 monthso 0 Yes FALLS RISK SCREEN History of falling - immediate or within 3 months 25 Yes Secondary diagnosis (Do you have 2 or more medical diagnoseso) 15 Yes Ambulatory aid None/bed rest/wheelchair/nurse 0 No Crutches/cane/walker 15 Yes Furniture 0 No Intravenous therapy Access/Saline/Heparin Lock 0 No Gait/Transferring Normal/ bed rest/ wheelchair 0 No Weak (short steps with or without  shuffle, stooped but able to lift head while walking, may seek 10 Yes support from furniture) Impaired (short steps with shuffle, may have difficulty arising from chair, head down, impaired 0 No balance) Mental Status Oriented to own ability 0 Yes Electronic Signature(s) Signed: 12/28/2021 4:30:21 PM By: Lorrin Jackson Entered By: Lorrin Jackson on 12/28/2021 08:28:00 -------------------------------------------------------------------------------- Foot Assessment Details Patient Name: Date of Service: Sperry, A LFRED 12/28/2021 8:00 A M Medical Record Number: [id number] Patient Account Number: [id number] Date of Birth/Sex: Treating RN: Aug 02, 1940 (81 y.o. [name] Primary Care Deovion Batrez: PA Haig Prophet, NO Other Clinician: Referring Wolfe Camarena: Treating Monisha Siebel/Extender: Yaakov Guthrie in Treatment: 0 Foot Assessment Items Site Locations + = Sensation present, - = Sensation absent, C = Callus, U = Ulcer R = Redness, W = Warmth, M = Maceration, PU = Pre-ulcerative lesion F = Fissure, S = Swelling, D = Dryness Assessment Right: Left: Other Deformity: No No Prior Foot Ulcer: No No Prior Amputation: No No Charcot Joint: No No Ambulatory Status: Ambulatory With Help Assistance Device: Walker Gait: Steady Electronic Signature(s) Signed: 12/28/2021 4:30:21  PM By: Lorrin Jackson Entered By: Lorrin Jackson on 12/28/2021 08:31:41 -------------------------------------------------------------------------------- Nutrition Risk Screening Details Patient Name: Date of Service: Lujean Rave, A LFRED 12/28/2021 8:00 A M Medical Record Number: [id number] Patient Account Number: [id number] Date of Birth/Sex: Treating RN: 10-04-1940 (81 y.o. [name] Primary Care Shemekia Patane: PA Haig Prophet, NO Other Clinician: Referring Myracle Febres: Treating Taitum Alms/Extender: Yaakov Guthrie in Treatment: 0 Height (in): 72 Weight (lbs): 180 Body Mass Index (BMI): 24.4 Nutrition Risk Screening Items Score Screening NUTRITION RISK SCREEN: I have an illness or condition that made me change the kind and/or amount of food I eat 0 No I eat fewer than two meals per day 0 No I eat few fruits and vegetables, or milk products 0 No I have three or more drinks of beer, liquor or wine almost every day 0 No I have tooth or mouth problems that make it hard for me to eat 0 No I don't always have enough money to buy the food I need 0 No I eat alone most of the time 0 No I take three or more different prescribed or over-the-counter drugs a day 1 Yes Without wanting to, I have lost or gained 10 pounds in the last six months 0 No I am not always physically able to shop, cook and/or feed myself 0 No Nutrition Protocols Good Risk Protocol 0 No interventions needed Moderate Risk Protocol High Risk Proctocol Risk Level: Good Risk Score: 1 Electronic Signature(s) Signed: 12/28/2021 4:30:21 PM By: Lorrin Jackson Entered By: Lorrin Jackson on 12/28/2021 08:29:13

## 2021-12-29 ENCOUNTER — Ambulatory Visit: Payer: Medicare Other | Admitting: Podiatry

## 2021-12-30 ENCOUNTER — Ambulatory Visit: Payer: Medicare Other | Admitting: Physician Assistant

## 2021-12-31 ENCOUNTER — Telehealth: Payer: Self-pay | Admitting: *Deleted

## 2021-12-31 ENCOUNTER — Telehealth: Payer: Self-pay

## 2021-12-31 NOTE — Telephone Encounter (Signed)
Nurse w/ [facility] is requesting wound care orders for 1 time weekly for 4 weeks for right 2nd toe, resumption of care since patient is back home. He does have an upcoming appointment with wound center but the they will not sign off on 485(them going out to see patient ),he does not have a PCP but is scheduled on 11/14) Please advise.

## 2021-12-31 NOTE — Telephone Encounter (Signed)
Called and left message for [name], (nurse ) that it is ok for wound care orders.

## 2021-12-31 NOTE — Telephone Encounter (Signed)
Attempted to contact patient to schedule a Palliative Care consult appointment. No answer or voicemail. Busy signal.

## 2022-01-01 NOTE — Telephone Encounter (Signed)
error 

## 2022-01-05 ENCOUNTER — Telehealth: Payer: Self-pay

## 2022-01-05 ENCOUNTER — Ambulatory Visit (HOSPITAL_BASED_OUTPATIENT_CLINIC_OR_DEPARTMENT_OTHER): Payer: Medicare Other | Admitting: Internal Medicine

## 2022-01-05 ENCOUNTER — Ambulatory Visit (HOSPITAL_COMMUNITY): Admission: RE | Admit: 2022-01-05 | Payer: Medicare Other | Source: Ambulatory Visit

## 2022-01-05 NOTE — Telephone Encounter (Signed)
Attempted to contact patient to schedule a Palliative Care consult appointment. No answer left a message to return call. If no response by 10/12 will close referral.

## 2022-01-06 ENCOUNTER — Telehealth: Payer: Self-pay | Admitting: Internal Medicine

## 2022-01-06 ENCOUNTER — Telehealth: Payer: Self-pay

## 2022-01-06 NOTE — Telephone Encounter (Signed)
Patient does not meet new medical criteria for Palliative Care services. Referral has been closed. Referring provider advised. 

## 2022-01-06 NOTE — Telephone Encounter (Signed)
New Message:     Lexine Baton called from [place]. She said this patient no longer meets the criteria for Palliative Care. Patient must have an active Cancer diagnosis or near Hospice diagnosis.

## 2022-01-08 ENCOUNTER — Other Ambulatory Visit (HOSPITAL_COMMUNITY): Payer: Medicare Other

## 2022-01-14 NOTE — Telephone Encounter (Signed)
Charlie Pitter, PA-C  You 8 days ago    Thank you for the update. Would recommend they touch base with PCP. I did not specifically order palliative care so just to make sure the provider that did order it is aware.          Left detailed message on nurse line with [facility] with Melina Copa recommendations. Advised a callback with any questions or concerns.

## 2022-01-15 ENCOUNTER — Ambulatory Visit (HOSPITAL_BASED_OUTPATIENT_CLINIC_OR_DEPARTMENT_OTHER): Payer: Medicare Other | Admitting: Internal Medicine

## 2022-01-19 ENCOUNTER — Encounter (HOSPITAL_COMMUNITY): Payer: Self-pay | Admitting: Physician Assistant

## 2022-01-28 ENCOUNTER — Telehealth (HOSPITAL_COMMUNITY): Payer: Self-pay | Admitting: Physician Assistant

## 2022-01-28 NOTE — Telephone Encounter (Signed)
Just an FYI. We have made several attempts to contact this patient including sending a letter to schedule or reschedule their echocardiogram. We will be removing the patient from the echo WQ.   MAILED LETTER [facility] 01/19/22 Called and LB @ 9:22/LBW 01/14/22 [facility] to schedule at [facility] @ 8:34/LBW 01/11/22 [facility] to schedule @ [facility]/LBW 12:26 12/25/2021 2:00 PM ZO:[id number], Zaira Iacovelli BCancel AVW:[id number] (pt wants in [place]) 12/22/21 [facility] to schedule @ 3:28/LBW Patient stated they would call to schedule.      Thank you

## 2022-02-09 ENCOUNTER — Ambulatory Visit: Payer: Medicare Other | Admitting: Family Medicine

## 2022-02-16 ENCOUNTER — Encounter: Payer: Self-pay | Admitting: Family Medicine

## 2022-02-16 ENCOUNTER — Ambulatory Visit (INDEPENDENT_AMBULATORY_CARE_PROVIDER_SITE_OTHER): Payer: Medicare Other | Admitting: Family Medicine

## 2022-02-16 ENCOUNTER — Telehealth: Payer: Self-pay | Admitting: Internal Medicine

## 2022-02-16 VITALS — BP 178/81 | HR 74 | Temp 97.8°F | Ht 73.0 in | Wt 175.6 lb

## 2022-02-16 DIAGNOSIS — E785 Hyperlipidemia, unspecified: Secondary | ICD-10-CM

## 2022-02-16 DIAGNOSIS — S32010A Wedge compression fracture of first lumbar vertebra, initial encounter for closed fracture: Secondary | ICD-10-CM

## 2022-02-16 DIAGNOSIS — I5043 Acute on chronic combined systolic (congestive) and diastolic (congestive) heart failure: Secondary | ICD-10-CM | POA: Diagnosis not present

## 2022-02-16 DIAGNOSIS — E1159 Type 2 diabetes mellitus with other circulatory complications: Secondary | ICD-10-CM

## 2022-02-16 DIAGNOSIS — E1141 Type 2 diabetes mellitus with diabetic mononeuropathy: Secondary | ICD-10-CM

## 2022-02-16 DIAGNOSIS — E538 Deficiency of other specified B group vitamins: Secondary | ICD-10-CM

## 2022-02-16 DIAGNOSIS — E1151 Type 2 diabetes mellitus with diabetic peripheral angiopathy without gangrene: Secondary | ICD-10-CM

## 2022-02-16 DIAGNOSIS — N1831 Chronic kidney disease, stage 3a: Secondary | ICD-10-CM

## 2022-02-16 DIAGNOSIS — E1169 Type 2 diabetes mellitus with other specified complication: Secondary | ICD-10-CM | POA: Insufficient documentation

## 2022-02-16 DIAGNOSIS — E559 Vitamin D deficiency, unspecified: Secondary | ICD-10-CM

## 2022-02-16 DIAGNOSIS — F5101 Primary insomnia: Secondary | ICD-10-CM

## 2022-02-16 DIAGNOSIS — I152 Hypertension secondary to endocrine disorders: Secondary | ICD-10-CM | POA: Insufficient documentation

## 2022-02-16 LAB — BAYER DCA HB A1C WAIVED: HB A1C (BAYER DCA - WAIVED): 7 % — ABNORMAL HIGH (ref 4.8–5.6)

## 2022-02-16 MED ORDER — FUROSEMIDE 40 MG PO TABS: 40.0000 mg | ORAL_TABLET | Freq: Every day | ORAL | 1 refills | Status: DC

## 2022-02-16 MED ORDER — GLIPIZIDE 5 MG PO TABS: 5.0000 mg | ORAL_TABLET | Freq: Every day | ORAL | 1 refills | Status: DC

## 2022-02-16 MED ORDER — DAPAGLIFLOZIN PROPANEDIOL 10 MG PO TABS: 10.0000 mg | ORAL_TABLET | Freq: Every day | ORAL | 1 refills | Status: DC

## 2022-02-16 MED ORDER — METOPROLOL SUCCINATE ER 25 MG PO TB24: 12.5000 mg | ORAL_TABLET | Freq: Every day | ORAL | 1 refills | Status: DC

## 2022-02-16 NOTE — Telephone Encounter (Signed)
  Patient would like to switch from Dr Izora Ribas to Dr Antoine Poche so that he can be seen in [place].

## 2022-02-16 NOTE — Patient Instructions (Signed)

## 2022-02-16 NOTE — Progress Notes (Signed)
Subjective:  Patient ID: Wayne [name], male    DOB: 21-Feb-1941, 81 y.o.   MRN: [id number]  Patient Care Team: Baruch Gouty, FNP as PCP - General (Family Medicine) Werner Lean, MD as PCP - Cardiology (Cardiology)   Chief Complaint:  New Patient (Initial Visit) ([facility]. Patient has been out for a few months. ) and Establish Care (/)   HPI: Wayne [name] is a 81 y.o. male presenting on 02/16/2022 for New Patient (Initial Visit) ([facility]. Patient has been out for a few months. ) and Establish Care (/)  Pt presents today to establish care with new PCP. He has not had a PCP in over 5 years. He has a history of DM with associated PAD, HTN, HLD, peripheral neuropathy, CAD, CKD, former tobacco use, Vit D deficiency, Vit B12 deficiency, and compression fracture of lumbar. He had recent complex hospital admissions for multiple issues and was discharged to [facility]. He has been home for 2 months. His daughter and ex-wife are with him today. He does have slight cognitive impairment.   Hospital follow up note from cardiology: He was admitted 8/16-8/21/23 with right foot cellulitis, diabetic foot infection, right 2nd phalynx fracture, suspected diabetic neuropathy as well as B12 deficiency, uncontrolled DM, anemia of chronic disease, lactic acidosis, acute metabolic encephalopathy on suspected chronic cognitive impairment, AKI on suspected CKD 3a, and PAD. He had PV angio 11/13/21 with diffusely diseased popliteal but widely patent, occluded R tibial artery, unable to cross the lesion antegrade - recommended to follow clinically and if he had deterioration, consider retrograde posterior tibial access. He was discharged to home and lives alone but hired help and family to assist. He returned to the hospital 11/28/2021 with fall. He was found down on the  floor by his girlfriend and unable to get up. EMS was called. He was confused, hypoxic 85% RA, and complained of shortness of breath. He was unable to recall details of fall but had scalp bruising so CT head was done showing left parafalcine subdural hemorrhage. Neurosurgery has recommended conservative management per notes. Other issues included multifocal PNA, lactic acidosis, L1 compression fx, B12 deficiency, failure to thrive prompting palliative consultation and became DNR with otherwise full scope of care. As part of his workup had echo showing EF 35% (The mid and distal anterior wall, mid and distal lateral wall, mid and distal anterior septum, mid anterolateral segment, mid inferoseptal segment, and apical inferior segment are hypokinetic), G2DD, severely elevated PASP, dilated IVC, swirling apical contrast is seen indicative of slow flow but no LV thrombus. There was no echo on file to compare to. He was diuresed and GDMT advanced, with plan for conservative management/non-invasive strategy given his SDH and comorbidities. Daughter requested no statin. Most days we saw him he was pleasantly confused. He required discharge to SNF.    Relevant past medical, surgical, family, and social history reviewed and updated as indicated.  Allergies and medications reviewed and updated. Data reviewed: Chart in Epic.   Past Medical History:  Diagnosis Date   Anemia of chronic disease    Chronic HFrEF (heart failure with reduced ejection fraction) (HCC)    Chronic kidney disease, stage 3a (HCC)    Coronary artery disease    Diabetes mellitus with circulatory complication (HCC)    Diabetic foot infection ([place])    Encephalopathy    Former  tobacco use    PAD (peripheral artery disease) (HCC)    Peripheral neuropathy    SDH (subdural hematoma) (HCC)    Severe pulmonary hypertension (HCC)     Past Surgical History:  Procedure Laterality Date   ABDOMINAL AORTOGRAM W/LOWER EXTREMITY Right 11/13/2021    Procedure: ABDOMINAL AORTOGRAM W/LOWER EXTREMITY;  Surgeon: Cherre Robins, MD;  Location: [facility];  Service: Cardiovascular;  Laterality: Right;   CORONARY ANGIOPLASTY WITH STENT PLACEMENT      Social History   Socioeconomic History   Marital status: Divorced    Spouse name: Not on file   Number of children: Not on file   Years of education: Not on file   Highest education level: Not on file  Occupational History   Not on file  Tobacco Use   Smoking status: Former    Types: Cigarettes    Quit date: 71    Years since quitting: 30.9   Smokeless tobacco: Never  Vaping Use   Vaping Use: Never used  Substance and Sexual Activity   Alcohol use: No   Drug use: No   Sexual activity: Not Currently  Other Topics Concern   Not on file  Social History Narrative   Not on file   Social Determinants of Health   Financial Resource Strain: Not on file  Food Insecurity: Not on file  Transportation Needs: Not on file  Physical Activity: Not on file  Stress: Not on file  Social Connections: Not on file  Intimate Partner Violence: Not on file    Outpatient Encounter Medications as of 02/16/2022  Medication Sig   Accu-Chek Softclix Lancets lancets Use 4 times daily as directed to check blood sugars.   acetaminophen (TYLENOL) 500 MG tablet Take 2 tablets (1,000 mg total) by mouth every 8 (eight) hours. (Patient taking differently: Take 1,000 mg by mouth every 8 (eight) hours as needed.)   blood glucose meter kit and supplies KIT Dispense based on patient and insurance preference. Use up to four times daily as directed.   blood glucose meter kit and supplies KIT Use up to four times daily as directed.   Cholecalciferol (VITAMIN D3) 1000 units CAPS Take 2,000 Units by mouth daily.   glucose blood test strip Use as instructed   glucose blood test strip Use 4 times daily as directed to check bloos sugars.   sacubitril-valsartan (ENTRESTO) 49-51 MG Take 1 tablet by mouth 2  (two) times daily.   [DISCONTINUED] dapagliflozin propanediol (FARXIGA) 10 MG TABS tablet Take 1 tablet (10 mg total) by mouth daily.   [DISCONTINUED] furosemide (LASIX) 40 MG tablet Take 1 tablet (40 mg total) by mouth daily.   [DISCONTINUED] glipiZIDE (GLUCOTROL) 5 MG tablet Take 0.5 tablets (2.5 mg total) by mouth daily before breakfast. (Patient taking differently: Take 5 mg by mouth daily before breakfast.)   [DISCONTINUED] melatonin 5 MG TABS Take 5 mg by mouth at bedtime.   [DISCONTINUED] metoprolol succinate (TOPROL-XL) 25 MG 24 hr tablet Take 0.5 tablets (12.5 mg total) by mouth daily.   dapagliflozin propanediol (FARXIGA) 10 MG TABS tablet Take 1 tablet (10 mg total) by mouth daily.   furosemide (LASIX) 40 MG tablet Take 1 tablet (40 mg total) by mouth daily.   glipiZIDE (GLUCOTROL) 5 MG tablet Take 1 tablet (5 mg total) by mouth daily before breakfast.   metoprolol succinate (TOPROL-XL) 25 MG 24 hr tablet Take 0.5 tablets (12.5 mg total) by mouth daily.   [DISCONTINUED] amoxicillin-clavulanate (AUGMENTIN) 875-125  MG tablet Take 1 tablet by mouth 2 (two) times daily.   [DISCONTINUED] nitroGLYCERIN (NITRO-DUR) 0.2 mg/hr patch Place 1 patch (0.2 mg total) onto the skin daily.   [DISCONTINUED] oxyCODONE (OXY IR/ROXICODONE) 5 MG immediate release tablet Take 1 tablet (5 mg total) by mouth every 6 (six) hours as needed for severe pain.   [DISCONTINUED] polyethylene glycol (MIRALAX / GLYCOLAX) 17 g packet Take 17 g by mouth daily.   [DISCONTINUED] spironolactone (ALDACTONE) 25 MG tablet Take 0.5 tablets (12.5 mg total) by mouth daily.   [DISCONTINUED] Triclosan 0.25 % LIQD Apply 1 Squirt topically daily. Apply to right 2nd toe daily   No facility-administered encounter medications on file as of 02/16/2022.    No Known Allergies  Review of Systems  Constitutional:  Positive for activity change and fatigue. Negative for appetite change, chills, diaphoresis, fever and unexpected weight  change.  Respiratory:  Negative for cough and shortness of breath.   Cardiovascular:  Negative for chest pain, palpitations and leg swelling.  Gastrointestinal:  Negative for abdominal pain, constipation, diarrhea, nausea and vomiting.  Endocrine: Negative for polydipsia, polyphagia and polyuria.  Genitourinary:  Negative for decreased urine volume and difficulty urinating.  Musculoskeletal:  Positive for arthralgias, back pain and gait problem.  Neurological:  Positive for weakness (generalized). Negative for dizziness, tremors, seizures, syncope, facial asymmetry, speech difficulty, light-headedness, numbness and headaches.  Psychiatric/Behavioral:  Positive for confusion and sleep disturbance.   All other systems reviewed and are negative.       Objective:  BP (!) 178/81   Pulse 74   Temp 97.8 F (36.6 C) (Temporal)   Ht _0  (1.854 m)   Wt 175 lb 9.6 oz (79.7 kg)   SpO2 97%   BMI 23.17 kg/m    Wt Readings from Last 3 Encounters:  02/16/22 175 lb 9.6 oz (79.7 kg)  12/22/21 169 lb 12.8 oz (77 kg)  12/22/21 156 lb (70.8 kg)    Physical Exam Vitals and nursing note reviewed.  Constitutional:      General: He is not in acute distress.    Appearance: He is not ill-appearing, toxic-appearing or diaphoretic.  HENT:     Head: Normocephalic and atraumatic.     Right Ear: Decreased hearing noted.     Left Ear: Decreased hearing noted.     Ears:     Comments: Hearing aids    Nose: Nose normal.     Mouth/Throat:     Mouth: Mucous membranes are moist.     Pharynx: Oropharynx is clear.  Eyes:     Conjunctiva/sclera: Conjunctivae normal.     Pupils: Pupils are equal, round, and reactive to light.  Cardiovascular:     Rate and Rhythm: Normal rate and regular rhythm.     Heart sounds: Normal heart sounds. No murmur heard.    No friction rub. No gallop.  Pulmonary:     Effort: Pulmonary effort is normal.     Breath sounds: Normal breath sounds.  Abdominal:     General:  Bowel sounds are normal.     Palpations: Abdomen is soft.  Musculoskeletal:     Right lower leg: No edema.     Left lower leg: No edema.     Comments: Bilateral postoperative shoes in place  Skin:    General: Skin is warm and dry.     Capillary Refill: Capillary refill takes less than 2 seconds.  Neurological:     General: No focal deficit present.  Mental Status: He is alert.  Psychiatric:        Attention and Perception: Attention normal.        Mood and Affect: Mood normal.        Speech: Speech is delayed.        Behavior: Behavior normal. Behavior is cooperative.        Thought Content: Thought content normal.        Cognition and Memory: Cognition is impaired. Memory is impaired.        Judgment: Judgment normal.     Results for orders placed or performed during the hospital encounter of 11/28/21  Blood culture (routine x 2)   Specimen: Right Antecubital; Blood  Result Value Ref Range   Specimen Description      RIGHT ANTECUBITAL BOTTLES DRAWN AEROBIC AND ANAEROBIC   Special Requests      Blood Culture results may not be optimal due to an excessive volume of blood received in culture bottles   Culture      NO GROWTH 5 DAYS Performed at [facility], [address] [place], [place] [address]    Report Status 12/03/2021 FINAL   Blood culture (routine x 2)   Specimen: Left Antecubital; Blood  Result Value Ref Range   Specimen Description      LEFT ANTECUBITAL BOTTLES DRAWN AEROBIC AND ANAEROBIC   Special Requests Blood Culture adequate volume    Culture      NO GROWTH 5 DAYS Performed at [facility], [address], [place], [place] [address]    Report Status 12/03/2021 FINAL   Urine Culture   Specimen: In/Out Cath Urine  Result Value Ref Range   Specimen Description      IN/OUT CATH URINE Performed at [facility], [address] [place], [place] [address]    Special Requests      NONE Performed at [facility], [address], [place], [place]  [address]    Culture      NO GROWTH Performed at [facility], [place] [address], [place], [place] [address]    Report Status 12/01/2021 FINAL   Resp Panel by RT-PCR (Flu A&B, Covid) Anterior Nasal Swab   Specimen: Anterior Nasal Swab  Result Value Ref Range   SARS Coronavirus 2 by RT PCR NEGATIVE NEGATIVE   Influenza A by PCR NEGATIVE NEGATIVE   Influenza B by PCR NEGATIVE NEGATIVE  MRSA Next Gen by PCR, Nasal   Specimen: Nasal Mucosa; Nasal Swab  Result Value Ref Range   MRSA by PCR Next Gen NOT DETECTED NOT DETECTED  Lactic acid, plasma  Result Value Ref Range   Lactic Acid, Venous 2.4 (HH) 0.5 - 1.9 mmol/L  Lactic acid, plasma  Result Value Ref Range   Lactic Acid, Venous 2.7 (HH) 0.5 - 1.9 mmol/L  Comprehensive metabolic panel  Result Value Ref Range   Sodium 138 135 - 145 mmol/L   Potassium 4.3 3.5 - 5.1 mmol/L   Chloride 108 98 - 111 mmol/L   CO2 22 22 - 32 mmol/L   Glucose, Bld 223 (H) 70 - 99 mg/dL   BUN 22 8 - 23 mg/dL   Creatinine, Ser 1.13 0.61 - 1.24 mg/dL   Calcium 8.2 (L) 8.9 - 10.3 mg/dL   Total Protein 7.0 6.5 - 8.1 g/dL   Albumin 3.3 (L) 3.5 - 5.0 g/dL   AST 26 15 - 41 U/L   ALT 23 0 - 44 U/L   Alkaline Phosphatase 248 (H)  38 - 126 U/L   Total Bilirubin 1.1 0.3 - 1.2 mg/dL   GFR, Estimated >60 >60 mL/min   Anion gap 8 5 - 15  CBC with Differential  Result Value Ref Range   WBC 10.8 (H) 4.0 - 10.5 K/uL   RBC 3.85 (L) 4.22 - 5.81 MIL/uL   Hemoglobin 11.4 (L) 13.0 - 17.0 g/dL   HCT 36.2 (L) 39.0 - 52.0 %   MCV 94.0 80.0 - 100.0 fL   MCH 29.6 26.0 - 34.0 pg   MCHC 31.5 30.0 - 36.0 g/dL   RDW 13.4 11.5 - 15.5 %   Platelets 197 150 - 400 K/uL   nRBC 0.0 0.0 - 0.2 %   Neutrophils Relative % 81 %   Neutro Abs 8.7 (H) 1.7 - 7.7 K/uL   Lymphocytes Relative 11 %   Lymphs Abs 1.1 0.7 - 4.0 K/uL   Monocytes Relative 6 %   Monocytes Absolute 0.7 0.1 - 1.0 K/uL   Eosinophils Relative 1 %   Eosinophils Absolute 0.1 0.0 - 0.5 K/uL   Basophils Relative 0 %    Basophils Absolute 0.0 0.0 - 0.1 K/uL   Immature Granulocytes 1 %   Abs Immature Granulocytes 0.09 (H) 0.00 - 0.07 K/uL  Protime-INR  Result Value Ref Range   Prothrombin Time 15.0 11.4 - 15.2 seconds   INR 1.2 0.8 - 1.2  APTT  Result Value Ref Range   aPTT 30 24 - 36 seconds  Urinalysis, Routine w reflex microscopic Urine, Unspecified Source  Result Value Ref Range   Color, Urine YELLOW YELLOW   APPearance CLEAR CLEAR   Specific Gravity, Urine 1.022 1.005 - 1.030   pH 5.0 5.0 - 8.0   Glucose, UA >=500 (A) NEGATIVE mg/dL   Hgb urine dipstick SMALL (A) NEGATIVE   Bilirubin Urine NEGATIVE NEGATIVE   Ketones, ur 20 (A) NEGATIVE mg/dL   Protein, ur 100 (A) NEGATIVE mg/dL   Nitrite NEGATIVE NEGATIVE   Leukocytes,Ua NEGATIVE NEGATIVE   RBC / HPF 0-5 0 - 5 RBC/hpf   WBC, UA 0-5 0 - 5 WBC/hpf   Bacteria, UA RARE (A) NONE SEEN   Mucus PRESENT   Blood gas, venous ([facility], [facility], [facility])  Result Value Ref Range   FIO2 36 %   pH, Ven 7.35 7.25 - 7.43   pCO2, Ven 39 (L) 44 - 60 mmHg   pO2, Ven 33 32 - 45 mmHg   Bicarbonate 21.6 20.0 - 28.0 mmol/L   Acid-base deficit 3.9 (H) 0.0 - 2.0 mmol/L   O2 Saturation 52.2 %   Patient temperature 36.5    Collection site BLOOD LEFT FOREARM    Drawn by 1356   CK  Result Value Ref Range   Total CK 66 49 - 397 U/L  Brain natriuretic peptide  Result Value Ref Range   B Natriuretic Peptide 609.0 (H) 0.0 - 100.0 pg/mL  Procalcitonin - Baseline  Result Value Ref Range   Procalcitonin <0.10 ng/mL  Procalcitonin  Result Value Ref Range   Procalcitonin 0.25 ng/mL  Basic metabolic panel  Result Value Ref Range   Sodium 138 135 - 145 mmol/L   Potassium 4.1 3.5 - 5.1 mmol/L   Chloride 111 98 - 111 mmol/L   CO2 18 (L) 22 - 32 mmol/L   Glucose, Bld 263 (H) 70 - 99 mg/dL   BUN 27 (H) 8 - 23 mg/dL   Creatinine, Ser 1.17 0.61 - 1.24 mg/dL   Calcium 8.1 (L)  8.9 - 10.3 mg/dL   GFR, Estimated >60 >60 mL/min   Anion gap 9 5 - 15  Magnesium  Result Value  Ref Range   Magnesium 2.1 1.7 - 2.4 mg/dL  CBC with Differential/Platelet  Result Value Ref Range   WBC 9.9 4.0 - 10.5 K/uL   RBC 3.50 (L) 4.22 - 5.81 MIL/uL   Hemoglobin 10.2 (L) 13.0 - 17.0 g/dL   HCT 32.5 (L) 39.0 - 52.0 %   MCV 92.9 80.0 - 100.0 fL   MCH 29.1 26.0 - 34.0 pg   MCHC 31.4 30.0 - 36.0 g/dL   RDW 13.4 11.5 - 15.5 %   Platelets 169 150 - 400 K/uL   nRBC 0.0 0.0 - 0.2 %   Neutrophils Relative % 86 %   Neutro Abs 8.6 (H) 1.7 - 7.7 K/uL   Lymphocytes Relative 8 %   Lymphs Abs 0.8 0.7 - 4.0 K/uL   Monocytes Relative 5 %   Monocytes Absolute 0.5 0.1 - 1.0 K/uL   Eosinophils Relative 0 %   Eosinophils Absolute 0.0 0.0 - 0.5 K/uL   Basophils Relative 0 %   Basophils Absolute 0.0 0.0 - 0.1 K/uL   Immature Granulocytes 1 %   Abs Immature Granulocytes 0.05 0.00 - 0.07 K/uL  Brain natriuretic peptide  Result Value Ref Range   B Natriuretic Peptide 2,052.0 (H) 0.0 - 100.0 pg/mL  Glucose, capillary  Result Value Ref Range   Glucose-Capillary 274 (H) 70 - 99 mg/dL  Glucose, capillary  Result Value Ref Range   Glucose-Capillary 233 (H) 70 - 99 mg/dL  Glucose, capillary  Result Value Ref Range   Glucose-Capillary 211 (H) 70 - 99 mg/dL  Glucose, capillary  Result Value Ref Range   Glucose-Capillary 145 (H) 70 - 99 mg/dL  Vitamin B12  Result Value Ref Range   Vitamin B-12 515 180 - 914 pg/mL  Glucose, capillary  Result Value Ref Range   Glucose-Capillary 76 70 - 99 mg/dL  Procalcitonin  Result Value Ref Range   Procalcitonin 0.19 ng/mL  Basic metabolic panel  Result Value Ref Range   Sodium 136 135 - 145 mmol/L   Potassium 4.2 3.5 - 5.1 mmol/L   Chloride 111 98 - 111 mmol/L   CO2 19 (L) 22 - 32 mmol/L   Glucose, Bld 90 70 - 99 mg/dL   BUN 32 (H) 8 - 23 mg/dL   Creatinine, Ser 1.21 0.61 - 1.24 mg/dL   Calcium 7.9 (L) 8.9 - 10.3 mg/dL   GFR, Estimated 60 (L) >60 mL/min   Anion gap 6 5 - 15  Glucose, capillary  Result Value Ref Range   Glucose-Capillary 97 70  - 99 mg/dL  Glucose, capillary  Result Value Ref Range   Glucose-Capillary 92 70 - 99 mg/dL  Glucose, capillary  Result Value Ref Range   Glucose-Capillary 92 70 - 99 mg/dL  Glucose, capillary  Result Value Ref Range   Glucose-Capillary 210 (H) 70 - 99 mg/dL  Glucose, capillary  Result Value Ref Range   Glucose-Capillary 69 (L) 70 - 99 mg/dL  Glucose, capillary  Result Value Ref Range   Glucose-Capillary 104 (H) 70 - 99 mg/dL  Basic metabolic panel  Result Value Ref Range   Sodium 138 135 - 145 mmol/L   Potassium 4.4 3.5 - 5.1 mmol/L   Chloride 111 98 - 111 mmol/L   CO2 23 22 - 32 mmol/L   Glucose, Bld 156 (H) 70 - 99 mg/dL  BUN 31 (H) 8 - 23 mg/dL   Creatinine, Ser 1.20 0.61 - 1.24 mg/dL   Calcium 8.4 (L) 8.9 - 10.3 mg/dL   GFR, Estimated >60 >60 mL/min   Anion gap 4 (L) 5 - 15  Magnesium  Result Value Ref Range   Magnesium 2.3 1.7 - 2.4 mg/dL  Brain natriuretic peptide  Result Value Ref Range   B Natriuretic Peptide 1,283.0 (H) 0.0 - 100.0 pg/mL  Lipid panel  Result Value Ref Range   Cholesterol 105 0 - 200 mg/dL   Triglycerides 85 <150 mg/dL   HDL 27 (L) >40 mg/dL   Total CHOL/HDL Ratio 3.9 RATIO   VLDL 17 0 - 40 mg/dL   LDL Cholesterol 61 0 - 99 mg/dL  Glucose, capillary  Result Value Ref Range   Glucose-Capillary 144 (H) 70 - 99 mg/dL   Comment 1 Notify RN    Comment 2 Document in Chart   Glucose, capillary  Result Value Ref Range   Glucose-Capillary 147 (H) 70 - 99 mg/dL  Glucose, capillary  Result Value Ref Range   Glucose-Capillary 146 (H) 70 - 99 mg/dL  Glucose, capillary  Result Value Ref Range   Glucose-Capillary 238 (H) 70 - 99 mg/dL  Basic metabolic panel  Result Value Ref Range   Sodium 137 135 - 145 mmol/L   Potassium 3.8 3.5 - 5.1 mmol/L   Chloride 108 98 - 111 mmol/L   CO2 24 22 - 32 mmol/L   Glucose, Bld 138 (H) 70 - 99 mg/dL   BUN 29 (H) 8 - 23 mg/dL   Creatinine, Ser 1.20 0.61 - 1.24 mg/dL   Calcium 8.2 (L) 8.9 - 10.3 mg/dL   GFR,  Estimated >60 >60 mL/min   Anion gap 5 5 - 15  CBC  Result Value Ref Range   WBC 6.1 4.0 - 10.5 K/uL   RBC 3.30 (L) 4.22 - 5.81 MIL/uL   Hemoglobin 9.8 (L) 13.0 - 17.0 g/dL   HCT 30.6 (L) 39.0 - 52.0 %   MCV 92.7 80.0 - 100.0 fL   MCH 29.7 26.0 - 34.0 pg   MCHC 32.0 30.0 - 36.0 g/dL   RDW 13.6 11.5 - 15.5 %   Platelets 138 (L) 150 - 400 K/uL   nRBC 0.0 0.0 - 0.2 %  Brain natriuretic peptide  Result Value Ref Range   B Natriuretic Peptide 1,331.0 (H) 0.0 - 100.0 pg/mL  Magnesium  Result Value Ref Range   Magnesium 2.2 1.7 - 2.4 mg/dL  Glucose, capillary  Result Value Ref Range   Glucose-Capillary 221 (H) 70 - 99 mg/dL  Glucose, capillary  Result Value Ref Range   Glucose-Capillary 197 (H) 70 - 99 mg/dL  Glucose, capillary  Result Value Ref Range   Glucose-Capillary 135 (H) 70 - 99 mg/dL  Glucose, capillary  Result Value Ref Range   Glucose-Capillary 138 (H) 70 - 99 mg/dL  Glucose, capillary  Result Value Ref Range   Glucose-Capillary 183 (H) 70 - 99 mg/dL  Basic metabolic panel  Result Value Ref Range   Sodium 139 135 - 145 mmol/L   Potassium 3.9 3.5 - 5.1 mmol/L   Chloride 110 98 - 111 mmol/L   CO2 23 22 - 32 mmol/L   Glucose, Bld 90 70 - 99 mg/dL   BUN 32 (H) 8 - 23 mg/dL   Creatinine, Ser 1.13 0.61 - 1.24 mg/dL   Calcium 8.4 (L) 8.9 - 10.3 mg/dL   GFR, Estimated >60 >60  mL/min   Anion gap 6 5 - 15  CBC  Result Value Ref Range   WBC 6.5 4.0 - 10.5 K/uL   RBC 3.53 (L) 4.22 - 5.81 MIL/uL   Hemoglobin 10.5 (L) 13.0 - 17.0 g/dL   HCT 32.9 (L) 39.0 - 52.0 %   MCV 93.2 80.0 - 100.0 fL   MCH 29.7 26.0 - 34.0 pg   MCHC 31.9 30.0 - 36.0 g/dL   RDW 13.6 11.5 - 15.5 %   Platelets 139 (L) 150 - 400 K/uL   nRBC 0.0 0.0 - 0.2 %  Glucose, capillary  Result Value Ref Range   Glucose-Capillary 127 (H) 70 - 99 mg/dL  Glucose, capillary  Result Value Ref Range   Glucose-Capillary 104 (H) 70 - 99 mg/dL  Glucose, capillary  Result Value Ref Range   Glucose-Capillary 145  (H) 70 - 99 mg/dL  Glucose, capillary  Result Value Ref Range   Glucose-Capillary 190 (H) 70 - 99 mg/dL  Basic metabolic panel  Result Value Ref Range   Sodium 138 135 - 145 mmol/L   Potassium 3.6 3.5 - 5.1 mmol/L   Chloride 105 98 - 111 mmol/L   CO2 21 (L) 22 - 32 mmol/L   Glucose, Bld 135 (H) 70 - 99 mg/dL   BUN 34 (H) 8 - 23 mg/dL   Creatinine, Ser 1.17 0.61 - 1.24 mg/dL   Calcium 8.4 (L) 8.9 - 10.3 mg/dL   GFR, Estimated >60 >60 mL/min   Anion gap 12 5 - 15  CBC  Result Value Ref Range   WBC 7.2 4.0 - 10.5 K/uL   RBC 4.01 (L) 4.22 - 5.81 MIL/uL   Hemoglobin 11.9 (L) 13.0 - 17.0 g/dL   HCT 37.5 (L) 39.0 - 52.0 %   MCV 93.5 80.0 - 100.0 fL   MCH 29.7 26.0 - 34.0 pg   MCHC 31.7 30.0 - 36.0 g/dL   RDW 13.7 11.5 - 15.5 %   Platelets 144 (L) 150 - 400 K/uL   nRBC 0.0 0.0 - 0.2 %  Glucose, capillary  Result Value Ref Range   Glucose-Capillary 161 (H) 70 - 99 mg/dL  Glucose, capillary  Result Value Ref Range   Glucose-Capillary 137 (H) 70 - 99 mg/dL  Glucose, capillary  Result Value Ref Range   Glucose-Capillary 145 (H) 70 - 99 mg/dL  Glucose, capillary  Result Value Ref Range   Glucose-Capillary 122 (H) 70 - 99 mg/dL  CBG monitoring, ED  Result Value Ref Range   Glucose-Capillary 181 (H) 70 - 99 mg/dL  CBG monitoring, ED  Result Value Ref Range   Glucose-Capillary 233 (H) 70 - 99 mg/dL  ECHOCARDIOGRAM COMPLETE  Result Value Ref Range   Weight 3,167.57 oz   Height 72 in   BP 152/78 mmHg   Area-P 1/2 5.66 cm2   S' Lateral 3.70 cm       Pertinent labs & imaging results that were available during my care of the patient were reviewed by me and considered in my medical decision making.  Assessment & Plan:  Tyller was seen today for new patient (initial visit) and establish care.  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 Continue current regimen. Diet and exercise  encouraged. Labs pending.  -     dapagliflozin propanediol (FARXIGA) 10 MG TABS tablet; Take 1 tablet (10 mg total) by mouth daily. -  glipiZIDE (GLUCOTROL) 5 MG tablet; Take 1 tablet (5 mg total) by mouth daily before breakfast. -     Ambulatory referral to [facility] Hb A1c Waived -     CBC with Differential/Platelet -     CMP14+EGFR -     Lipid panel -     Thyroid Panel With TSH -     Microalbumin / creatinine urine ratio  Diabetic mononeuropathy associated with type 2 diabetes mellitus ([place]) Labs pending. Home health referral placed for gait training.  -     Ambulatory referral to [place] -     Vitamin B12  Acute on chronic combined systolic and diastolic CHF (congestive heart failure) ([place]) Would like to establish with cardiology closer to home. Will place referral. Aware to call cardiology for entresto refill. If unable to fill prior to seeing new cardiologist, will refill. Labs pending. Well compensated in office.  -     furosemide (LASIX) 40 MG tablet; Take 1 tablet (40 mg total) by mouth daily. -     metoprolol succinate (TOPROL-XL) 25 MG 24 hr tablet; Take 0.5 tablets (12.5 mg total) by mouth daily. -     Ambulatory referral to [place] -     Ambulatory referral to Cardiology  Chronic kidney disease, stage 3a ([place]) Labs pending. Once resulted, will adjust hypertensive regimen. -     Ambulatory referral to [place] with Differential/Platelet -     CMP14+EGFR  Hypertension associated with diabetes ([place]) Not controlled. Will await labs and then adjust treatment regimen for better control. DASH diet and exercise encouraged.  -     furosemide (LASIX) 40 MG tablet; Take 1 tablet (40 mg total) by mouth daily. -     metoprolol succinate (TOPROL-XL) 25 MG 24 hr tablet; Take 0.5 tablets (12.5 mg total) by mouth daily. -     Ambulatory referral to [place] with Differential/Platelet -     CMP14+EGFR -     Lipid  panel -     Thyroid Panel With TSH -     Ambulatory referral to Cardiology  Hyperlipidemia associated with type 2 diabetes mellitus ([place]) Labs pending. Family declined statin therapy.  -     Ambulatory referral to [place] -     Lipid panel  Closed compression fracture of body of L1 vertebra ([place]) Needs PT for strength and gait training, will place referral for [facility]. -     Ambulatory referral to [place] D deficiency Will recheck labs today and adjust repletion therapy as warranted.  -     Ambulatory referral to [place] D 25 Hydroxy (Vit-D Deficiency, Fractures)  Vitamin B12 deficiency Will recheck labs today and adjust repletion therapy as warranted.  -     Ambulatory referral to Home Health -     Vitamin B12  Primary insomnia Discussed sleep hygiene and use of melatonin.  -     Ambulatory referral to [facility] records requested.    Continue all other maintenance medications.  Follow up plan: Return in about 3 months (around 05/19/2022), or if symptoms worsen or fail to improve, for DM.   Continue healthy lifestyle choices, including diet (rich in fruits, vegetables, and lean proteins, and low in salt and simple  carbohydrates) and exercise (at least 30 minutes of moderate physical activity daily).  Educational handout given for DM  The above assessment and management plan was discussed with the patient. The patient verbalized understanding of and has agreed to the management plan. Patient is aware to call the clinic if they develop any new symptoms or if symptoms persist or worsen. Patient is aware when to return to the clinic for a follow-up visit. Patient educated on when it is appropriate to go to the emergency department.   Monia Pouch, FNP-C [facility] [phone]

## 2022-02-17 ENCOUNTER — Other Ambulatory Visit: Payer: Self-pay

## 2022-02-17 DIAGNOSIS — I5043 Acute on chronic combined systolic (congestive) and diastolic (congestive) heart failure: Secondary | ICD-10-CM

## 2022-02-17 DIAGNOSIS — E1151 Type 2 diabetes mellitus with diabetic peripheral angiopathy without gangrene: Secondary | ICD-10-CM

## 2022-02-17 DIAGNOSIS — E1159 Type 2 diabetes mellitus with other circulatory complications: Secondary | ICD-10-CM

## 2022-02-17 LAB — CBC WITH DIFFERENTIAL/PLATELET
Basophils Absolute: 0 10*3/uL (ref 0.0–0.2)
Basos: 1 %
EOS (ABSOLUTE): 0.2 10*3/uL (ref 0.0–0.4)
Eos: 4 %
Hematocrit: 44.2 % (ref 37.5–51.0)
Hemoglobin: 15.1 g/dL (ref 13.0–17.7)
Immature Grans (Abs): 0 10*3/uL (ref 0.0–0.1)
Immature Granulocytes: 0 %
Lymphocytes Absolute: 2 10*3/uL (ref 0.7–3.1)
Lymphs: 35 %
MCH: 29.7 pg (ref 26.6–33.0)
MCHC: 34.2 g/dL (ref 31.5–35.7)
MCV: 87 fL (ref 79–97)
Monocytes Absolute: 0.5 10*3/uL (ref 0.1–0.9)
Monocytes: 9 %
Neutrophils Absolute: 2.8 10*3/uL (ref 1.4–7.0)
Neutrophils: 51 %
Platelets: 140 10*3/uL — ABNORMAL LOW (ref 150–450)
RBC: 5.09 x10E6/uL (ref 4.14–5.80)
RDW: 13.6 % (ref 11.6–15.4)
WBC: 5.6 10*3/uL (ref 3.4–10.8)

## 2022-02-17 LAB — THYROID PANEL WITH TSH
Free Thyroxine Index: 1.8 (ref 1.2–4.9)
T3 Uptake Ratio: 29 % (ref 24–39)
T4, Total: 6.3 ug/dL (ref 4.5–12.0)
TSH: 0.954 u[IU]/mL (ref 0.450–4.500)

## 2022-02-17 LAB — VITAMIN D 25 HYDROXY (VIT D DEFICIENCY, FRACTURES): Vit D, 25-Hydroxy: 18.1 ng/mL — ABNORMAL LOW (ref 30.0–100.0)

## 2022-02-17 LAB — CMP14+EGFR
ALT: 11 IU/L (ref 0–44)
AST: 12 IU/L (ref 0–40)
Albumin/Globulin Ratio: 1.7 (ref 1.2–2.2)
Albumin: 4.4 g/dL (ref 3.7–4.7)
Alkaline Phosphatase: 107 IU/L (ref 44–121)
BUN/Creatinine Ratio: 35 — ABNORMAL HIGH (ref 10–24)
BUN: 48 mg/dL — ABNORMAL HIGH (ref 8–27)
Bilirubin Total: 0.4 mg/dL (ref 0.0–1.2)
CO2: 19 mmol/L — ABNORMAL LOW (ref 20–29)
Calcium: 9.2 mg/dL (ref 8.6–10.2)
Chloride: 105 mmol/L (ref 96–106)
Creatinine, Ser: 1.39 mg/dL — ABNORMAL HIGH (ref 0.76–1.27)
Globulin, Total: 2.6 g/dL (ref 1.5–4.5)
Glucose: 215 mg/dL — ABNORMAL HIGH (ref 70–99)
Potassium: 4.4 mmol/L (ref 3.5–5.2)
Sodium: 141 mmol/L (ref 134–144)
Total Protein: 7 g/dL (ref 6.0–8.5)
eGFR: 51 mL/min/{1.73_m2} — ABNORMAL LOW (ref >59)

## 2022-02-17 LAB — LIPID PANEL
Chol/HDL Ratio: 6.8 ratio — ABNORMAL HIGH (ref 0.0–5.0)
Cholesterol, Total: 232 mg/dL — ABNORMAL HIGH (ref 100–199)
HDL: 34 mg/dL — ABNORMAL LOW (ref >39)
LDL Chol Calc (NIH): 148 mg/dL — ABNORMAL HIGH (ref 0–99)
Triglycerides: 274 mg/dL — ABNORMAL HIGH (ref 0–149)
VLDL Cholesterol Cal: 50 mg/dL — ABNORMAL HIGH (ref 5–40)

## 2022-02-17 LAB — VITAMIN B12: Vitamin B-12: 394 pg/mL (ref 232–1245)

## 2022-02-17 MED ORDER — FUROSEMIDE 40 MG PO TABS: 40.0000 mg | ORAL_TABLET | Freq: Every day | ORAL | 1 refills | Status: DC

## 2022-02-17 MED ORDER — SACUBITRIL-VALSARTAN 49-51 MG PO TABS: 1.0000 | ORAL_TABLET | Freq: Two times a day (BID) | ORAL | 2 refills | Status: DC

## 2022-02-17 MED ORDER — DAPAGLIFLOZIN PROPANEDIOL 10 MG PO TABS: 10.0000 mg | ORAL_TABLET | Freq: Every day | ORAL | 1 refills | Status: DC

## 2022-02-17 MED ORDER — VITAMIN D (ERGOCALCIFEROL) 1.25 MG (50000 UNIT) PO CAPS: 50000.0000 [IU] | ORAL_CAPSULE | ORAL | 3 refills | Status: DC

## 2022-02-17 NOTE — Addendum Note (Signed)
Addended by: Sonny Masters on: 02/17/2022 07:46 AM   Modules accepted: Orders

## 2022-02-25 ENCOUNTER — Telehealth: Payer: Self-pay | Admitting: Family Medicine

## 2022-02-25 NOTE — Telephone Encounter (Signed)
Patient's daughter calling because she accidentally threw away patient's new medications (furosemide (LASIX) 40 MG tablet glipiZIDE (GLUCOTROL) 5 MG tablet metoprolol succinate (TOPROL-XL) 25 MG 24 hr tablet)  She is reaching out to the pharmacy to see if they are able to give her some for him to have now.

## 2022-02-25 NOTE — Telephone Encounter (Signed)
Spoke with pharmacy and glipized and metoprol can not go through insurance this early.  Will do discount card.  Called daughter and she is aware of prices. Medication is getting filled since she has refills on file.

## 2022-02-26 ENCOUNTER — Telehealth: Payer: Self-pay | Admitting: Family Medicine

## 2022-02-26 NOTE — Telephone Encounter (Signed)
Patient would like to go to [facility] instead of [facility] if possible.    Please fax order to [facility] at [phone] if possible

## 2022-03-05 NOTE — Telephone Encounter (Signed)
Na

## 2022-03-08 NOTE — Telephone Encounter (Signed)
Daughter rc 

## 2022-03-08 NOTE — Telephone Encounter (Signed)
lmtcb

## 2022-03-08 NOTE — Telephone Encounter (Signed)
I read previous messages to daughter about [facility]. Daughter says that physical therapist from [facility] is coming out today at 2:30. Daughter says that pt has used [facility] in the past. Huntley Dec and Maurine Minister are familiar with pt and family. Pt has already been be re-certified by the nurse from [facility].  Daughter does not want [facility] services.

## 2022-03-12 ENCOUNTER — Ambulatory Visit (INDEPENDENT_AMBULATORY_CARE_PROVIDER_SITE_OTHER): Payer: Medicare Other

## 2022-03-12 DIAGNOSIS — L89892 Pressure ulcer of other site, stage 2: Secondary | ICD-10-CM

## 2022-03-12 DIAGNOSIS — I2510 Atherosclerotic heart disease of native coronary artery without angina pectoris: Secondary | ICD-10-CM

## 2022-03-12 DIAGNOSIS — L97511 Non-pressure chronic ulcer of other part of right foot limited to breakdown of skin: Secondary | ICD-10-CM

## 2022-03-12 DIAGNOSIS — E785 Hyperlipidemia, unspecified: Secondary | ICD-10-CM

## 2022-03-12 DIAGNOSIS — S92501D Displaced unspecified fracture of right lesser toe(s), subsequent encounter for fracture with routine healing: Secondary | ICD-10-CM

## 2022-03-12 DIAGNOSIS — S32019D Unspecified fracture of first lumbar vertebra, subsequent encounter for fracture with routine healing: Secondary | ICD-10-CM

## 2022-03-12 DIAGNOSIS — E1142 Type 2 diabetes mellitus with diabetic polyneuropathy: Secondary | ICD-10-CM

## 2022-03-12 DIAGNOSIS — D631 Anemia in chronic kidney disease: Secondary | ICD-10-CM

## 2022-03-12 DIAGNOSIS — E11621 Type 2 diabetes mellitus with foot ulcer: Secondary | ICD-10-CM

## 2022-03-12 DIAGNOSIS — N1831 Chronic kidney disease, stage 3a: Secondary | ICD-10-CM

## 2022-03-12 DIAGNOSIS — I272 Pulmonary hypertension, unspecified: Secondary | ICD-10-CM

## 2022-03-12 DIAGNOSIS — E1159 Type 2 diabetes mellitus with other circulatory complications: Secondary | ICD-10-CM

## 2022-03-12 DIAGNOSIS — I70201 Unspecified atherosclerosis of native arteries of extremities, right leg: Secondary | ICD-10-CM

## 2022-03-12 DIAGNOSIS — I152 Hypertension secondary to endocrine disorders: Secondary | ICD-10-CM

## 2022-03-12 DIAGNOSIS — E1122 Type 2 diabetes mellitus with diabetic chronic kidney disease: Secondary | ICD-10-CM

## 2022-03-12 DIAGNOSIS — I5043 Acute on chronic combined systolic (congestive) and diastolic (congestive) heart failure: Secondary | ICD-10-CM

## 2022-03-12 DIAGNOSIS — E1151 Type 2 diabetes mellitus with diabetic peripheral angiopathy without gangrene: Secondary | ICD-10-CM

## 2022-03-12 DIAGNOSIS — E1169 Type 2 diabetes mellitus with other specified complication: Secondary | ICD-10-CM

## 2022-04-13 NOTE — Progress Notes (Signed)
Cardiology Clinic Note   Patient Name: Wayne [name] Date of Encounter: 04/16/2022  Primary Care Provider:  Baruch Gouty, FNP Primary Cardiologist:  Dr. Percival Spanish   Patient Profile    82 year old male with history of PAD, DM, HTN, peripheral neuropathy, former tobacco abuse, CAD (details unclear, remote stent by Dr. 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   Last seen by Melina Copa, PA on 12/21/2021, at which time he was found to be euvolemic, slightly hypertensive, and was to be monitored at skilled nursing facility for blood pressures which were to be reported if remaining elevated.  Repeat echocardiogram was ordered due to pulmonary hypertension.  Unfortunately, this was not completed prior to this office visit.  Past Medical History    Past Medical History:  Diagnosis Date   Anemia of chronic disease    Chronic HFrEF (heart failure with reduced ejection fraction) (HCC)    Chronic kidney disease, stage 3a (HCC)    Coronary artery disease    Diabetes mellitus with circulatory complication (HCC)    Diabetic foot infection ([place])    Encephalopathy    Former tobacco use    PAD (peripheral artery disease) (HCC)    Peripheral neuropathy    SDH (subdural hematoma) (HCC)    Severe pulmonary hypertension ([place])    Past Surgical History:  Procedure Laterality Date   ABDOMINAL AORTOGRAM W/LOWER EXTREMITY Right 11/13/2021   Procedure: ABDOMINAL AORTOGRAM W/LOWER EXTREMITY;  Surgeon: Cherre Robins, MD;  Location: [facility];  Service: Cardiovascular;  Laterality: Right;   CORONARY ANGIOPLASTY WITH STENT PLACEMENT      Allergies  No Known Allergies  History of Present Illness    [name] returns today for ongoing  assessment and management of coronary artery disease, chronic combined systolic diastolic CHF, hypertension, severe pulmonary hypertension, and hyperlipidemia.  Other noncardiac history includes diabetic neuropathy, chronic kidney disease stage IIIa, anemia, and failure to thrive.  On review of past notes, he had been referred to palliative care but did not meet criteria at that time.  He comes today with his family, his daughter Jeani Hawking, and his friend Leeanne Deed in attendance.  Mr. Schutter has been having excellent wound care by his daughter and a wound care specialist concerning foot ulcers of his toe and plantar area on the left.  He has had remarkable wound healing.  His daughter Jeani Hawking is very diligent concerning keeping the wounds clean and treated.  He is also undergoing physical therapy through [facility], which is in-home and occurs once a week.  His family is asking for increased frequency due to his deconditioning.  It was decreased at 1 point due to his foot wounds but he is able to be more active now.  He is medically compliant due to his daughter and his friend who manage all of this.  The patient is weighed daily around lunchtime and averages between 172 and 176 pounds.  He is eating better and has a better appetite therefore his dry weight is changing to closer to 175 pounds.  He denies any dyspnea on exertion, PND, orthopnea, lower extremity edema, chest discomfort, dizziness, or near syncope.  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  Home  Medications    Current Outpatient Medications  Medication Sig Dispense Refill   Accu-Chek Softclix Lancets lancets Use 4 times daily as directed to check blood sugars. 100 each 0   blood glucose meter kit and supplies KIT Dispense based on patient and insurance preference. Use up to four times daily as directed. 1 each 0   Cholecalciferol (VITAMIN D3) 1000 units  CAPS Take 2,000 Units by mouth daily.     dapagliflozin propanediol (FARXIGA) 10 MG TABS tablet Take 1 tablet (10 mg total) by mouth daily. 90 tablet 1   furosemide (LASIX) 40 MG tablet Take 1 tablet (40 mg total) by mouth daily. 90 tablet 1   glipiZIDE (GLUCOTROL) 5 MG tablet Take 1 tablet (5 mg total) by mouth daily before breakfast. 90 tablet 1   glucose blood test strip Use as instructed 100 each 12   glucose blood test strip Use 4 times daily as directed to check bloos sugars. 100 each 0   metoprolol succinate (TOPROL-XL) 25 MG 24 hr tablet Take 0.5 tablets (12.5 mg total) by mouth daily. 45 tablet 1   acetaminophen (TYLENOL) 500 MG tablet Take 2 tablets (1,000 mg total) by mouth every 8 (eight) hours. (Patient not taking: Reported on 04/16/2022) 30 tablet 0   blood glucose meter kit and supplies KIT Use up to four times daily as directed. 1 each 0   sacubitril-valsartan (ENTRESTO) 49-51 MG Take 1 tablet by mouth 2 (two) times daily. (Patient not taking: Reported on 04/16/2022) 180 tablet 2   Vitamin D, Ergocalciferol, (DRISDOL) 1.25 MG (50000 UNIT) CAPS capsule Take 1 capsule (50,000 Units total) by mouth every 7 (seven) days. (Patient not taking: Reported on 04/16/2022) 5 capsule 3   No current facility-administered medications for this visit.     Family History    Family History  Problem Relation Age of Onset   Cervical cancer Mother    Down syndrome Sister    Diabetes type II Neg Hx    He indicated that his mother is deceased. He indicated that his father is deceased. He indicated that his sister is deceased. He indicated that his maternal grandmother is deceased. He indicated that his maternal grandfather is deceased. He indicated that his paternal grandmother is deceased. He indicated that his paternal grandfather is deceased. He indicated that his daughter is alive. He indicated that the status of his neg hx is unknown.  Social History    Social History   Socioeconomic History    Marital status: Divorced    Spouse name: Not on file   Number of children: Not on file   Years of education: Not on file   Highest education level: Not on file  Occupational History   Not on file  Tobacco Use   Smoking status: Former    Types: Cigarettes    Quit date: 14    Years since quitting: 31.0   Smokeless tobacco: Never  Vaping Use   Vaping Use: Never used  Substance and Sexual Activity   Alcohol use: No   Drug use: No   Sexual activity: Not Currently  Other Topics Concern   Not on file  Social History Narrative   Not on file   Social Determinants of Health   Financial Resource Strain: Not on file  Food Insecurity: Not on file  Transportation Needs: Not on file  Physical Activity: Not on file  Stress: Not on file  Social Connections: Not on file  Intimate Partner Violence: Not on file  Review of Systems    General:  No chills, fever, night sweats or weight changes.  Cardiovascular:  No chest pain, dyspnea on exertion, edema, orthopnea, palpitations, paroxysmal nocturnal dyspnea. Dermatological: No rash, lesions/masses Respiratory: No cough, dyspnea Urologic: No hematuria, dysuria Abdominal:   No nausea, vomiting, diarrhea, bright red blood per rectum, melena, or hematemesis Neurologic:  No visual changes, wkns, changes in mental status. All other systems reviewed and are otherwise negative except as noted above.     Physical Exam    VS:  BP 124/72 (BP Location: Left Arm, Patient Position: Sitting, Cuff Size: Normal)   Pulse (!) 57   Ht 6\' 1"  (1.854 m)   Wt 179 lb 6.4 oz (81.4 kg)   SpO2 98%   BMI 23.67 kg/m  , BMI Body mass index is 23.67 kg/m.     GEN: Well nourished, well developed, in no acute distress. HEENT: normal. Neck: Supple, no JVD, carotid bruits, or masses. Cardiac: RRR, no murmurs, rubs, or gallops. No clubbing, cyanosis, edema.  Radials/DP/PT 2+ and equal bilaterally.  Respiratory:  Respirations regular and unlabored, clear  to auscultation bilaterally. GI: Soft, nontender, nondistended, BS + x 4. MS: no deformity or atrophy.  Dressing to left foot where wounds are located on the plantar area of the great toe, and second toe. Skin: warm and dry, no rash. Neuro:  Strength and sensation are diminished but intact.  Hard of hearing. Psych: Normal affect.  Accessory Clinical Findings    ECG personally reviewed by me today-sinus bradycardia, heart rate of 56 bpm- No acute changes  Lab Results  Component Value Date   WBC 5.6 02/16/2022   HGB 15.1 02/16/2022   HCT 44.2 02/16/2022   MCV 87 02/16/2022   PLT 140 (L) 02/16/2022   Lab Results  Component Value Date   CREATININE 1.39 (H) 02/16/2022   BUN 48 (H) 02/16/2022   NA 141 02/16/2022   K 4.4 02/16/2022   CL 105 02/16/2022   CO2 19 (L) 02/16/2022   Lab Results  Component Value Date   ALT 11 02/16/2022   AST 12 02/16/2022   ALKPHOS 107 02/16/2022   BILITOT 0.4 02/16/2022   Lab Results  Component Value Date   CHOL 232 (H) 02/16/2022   HDL 34 (L) 02/16/2022   LDLCALC 148 (H) 02/16/2022   TRIG 274 (H) 02/16/2022   CHOLHDL 6.8 (H) 02/16/2022    Lab Results  Component Value Date   HGBA1C 7.0 (H) 02/16/2022    Review of Prior Studies: 2D Echo 11/29/21   1. Left ventricular ejection fraction, by estimation, is 35%. The left  ventricle has moderately decreased function. The left ventricle  demonstrates regional wall motion abnormalities (see scoring  diagram/findings for description). Left ventricular  diastolic parameters are consistent with Grade II diastolic dysfunction  (pseudonormalization).   2. Right ventricular systolic function is normal. The right ventricular  size is normal. There is severely elevated pulmonary artery systolic  pressure. The estimated right ventricular systolic pressure is 15.1 mmHg.   3. Left atrial size was mildly dilated.   4. The mitral valve is grossly normal. Trivial mitral valve  regurgitation. No evidence of  mitral stenosis.   5. The aortic valve is normal in structure. There is mild calcification  of the aortic valve. Aortic valve regurgitation is not visualized. No  aortic stenosis is present.   6. The inferior vena cava is dilated in size with <50% respiratory  variability, suggesting right atrial pressure of 15  mmHg.   Conclusion(s)/Recommendation(s): No left ventricular mural or apical  thrombus/thrombi. Swirling apical contrast is seen indicative of slow  flow.    PV Angio 11/13/21 DATE OF SERVICE: 11/13/2021   PATIENT:  [name]  82 y.o. 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 [name], MD   ASSISTANT: none  INDICATION FOR PROCEDURE: Quinto Tippy is a 82 y.o. male with right foot ulcer with peripheral arterial disease. After careful discussion of risks, benefits, and alternatives the patient was offered angiography. The patient understood and wished to proceed.   OPERATIVE FINDINGS:  Terminal aorta and iliac arteries: Widely patent without flow-limiting stenosis   Right lower extremity: Common femoral artery: Widely patent without flow-limiting stenosis  Profunda femoris artery: Widely patent without flow-limiting stenosis  Superficial femoral artery: Diffusely diseased.  No stenosis greater than 50%. Popliteal artery: Diffusely diseased.  Widely patent. Anterior tibial artery: Occluded Tibioperoneal trunk: Widely patent Peroneal artery: Widely patent to the ankle.  Arborization to the PT, filling the foot. Posterior tibial artery: Patent at its origin.  Patent about the ankle.  Fills the pedal circulation. Pedal circulation: Disadvantaged.  Fills only  through PT.  Small vessel disease noted.   GLASS score.  Not applicable.  Inline flow to the ankle.   Wifi score 1 / 1 / 2.  Clinical stage III.   DESCRIPTION OF PROCEDURE: After identification of the patient in the pre-operative holding area, the patient was transferred to the operating room. The patient was positioned supine on the operating room table. The groins were prepped and draped in standard fashion. A surgical pause was performed confirming correct patient, procedure, and operative location.   The left groin was anesthetized with subcutaneous injection of 1% lidocaine. Using ultrasound guidance, the left common femoral artery was accessed with micropuncture technique. Fluoroscopy was used to confirm cannulation over the femoral head. The 54F sheath was upsized to 10F.    A Benson wire was advanced into the distal aorta. Over the wire an omni flush catheter was advanced to the level of L2. Aortogram was performed - see above for details.   The right common iliac artery was selected with an omniflush catheter and Bentson guidewire. The wire was advanced into the common femoral artery. Over the wire the omni flush catheter was advanced into the external iliac artery. Selective angiography was performed - see above for details.    The decision was made to intervene. The patient was heparinized with 8000 units of heparin. The 10F sheath was exchanged for a 69F x 45cm sheath. Selective angiography of the left lower extremity was performed prior to intervention.    I tried to recanalize the posterior tibial artery with a variety of wire and catheter exchanges.  I was unable to cross the lesion antegrade.  I elected to stop here.  All endovascular equipment was then removed.   A minx device was used to close the arteriotomy. Hemostasis was excellent upon completion.   Upon completion of the case instrument and sharps counts were confirmed correct. The patient was transferred to the PACU in good  condition. I was present for all portions of the procedure.   PLAN: ASA 81mg  PO QD. High intensity statin  therapy.  Inline flow to the ankle.  Patient optimized from a vascular standpoint.  Would recommend local care to the toe only.  Would not recommend amputation at this time.  We will follow closely clinically.  If he has deterioration, we could consider retrograde posterior tibial access.   Rande Brunt. [name], MD [facility] Phone Number: [phone] 11/13/2021 9:53 AM  Assessment & Plan   1.  Chronic combined CHF: Most recent echocardiogram was November 2023 with an EF of 35% with grade 2 diastolic dysfunction.  He is currently on guideline directed medical therapy without spironolactone.  This includes Entresto 49/51 mg twice daily, metoprolol 12.5 mg daily (patient is bradycardic and uptitration is not recommended), Farxiga 10 mg daily, Lasix 40 mg daily.  He is advised on daily weights.  His friend Eber Jones comes by each day around lunchtime and weighs him and his weight has been consistent on average around 174 pounds to 176 pounds.  He is eating better and has a good appetite and they are thinking that his dry weight is likely higher than originally seen at 172 pounds.  They are reinstructed on weight gain of 3 to 5 pounds and need to take extra dose of Lasix.  Currently he is euvolemic with no evidence of volume overload.  He will have a repeat echocardiogram which will be done at our [facility] if that is more convenient for the patient and family.  Refills on Farxiga 10 mg daily are ordered.  He is authorized to have other refills on current medications.  Would like to follow-up with a BMET to evaluate for kidney function on follow-up appointment.  They have yet to meet Dr. Antoine Poche, his cardiologist to which she has been reassigned.  Next appointment is requested with Dr. Antoine Poche.  2.  Severe deconditioning: This is in the setting of  chronic combined systolic diastolic CHF and slow healing foot ulcers.  He is currently having physical therapy once a week, but would recommend increasing to 2-3 times a week to help to increase his stamina and strength.  A letter is being sent to [facility] physical therapy to increase frequency.  3.  Type 2 diabetes: Being followed by PCP and continues on Farxiga and glipizide.  Labs are followed by PCP.  4.  Diabetic foot ulcers: Noted on the left plantar surface of left foot at the great toe and second toe.  His daughter is doing extraordinary care of this with daily inspections and wound treatment under the direction of wound care facility.  Current medicines are reviewed at length with the patient today.  I have spent 50 min's  dedicated to the care of this patient on the date of this encounter to include pre-visit review of records, assessment, management and diagnostic testing,with shared decision making.   Signed, Bettey Mare. Liborio Nixon, ANP, AACC   04/16/2022 1:35 PM      Office [phone] Fax [phone]  Notice: This dictation was prepared with Dragon dictation along with smaller phrase technology. Any transcriptional errors that result from this process are unintentional and may not be corrected upon review.

## 2022-04-16 ENCOUNTER — Encounter: Payer: Self-pay | Admitting: Adult Health

## 2022-04-16 ENCOUNTER — Ambulatory Visit: Payer: Medicare Other | Attending: Cardiology | Admitting: Adult Health

## 2022-04-16 VITALS — BP 124/72 | HR 57 | Ht 73.0 in | Wt 179.4 lb

## 2022-04-16 DIAGNOSIS — L97401 Non-pressure chronic ulcer of unspecified heel and midfoot limited to breakdown of skin: Secondary | ICD-10-CM

## 2022-04-16 DIAGNOSIS — E08621 Diabetes mellitus due to underlying condition with foot ulcer: Secondary | ICD-10-CM | POA: Diagnosis not present

## 2022-04-16 DIAGNOSIS — E1151 Type 2 diabetes mellitus with diabetic peripheral angiopathy without gangrene: Secondary | ICD-10-CM

## 2022-04-16 DIAGNOSIS — I5043 Acute on chronic combined systolic (congestive) and diastolic (congestive) heart failure: Secondary | ICD-10-CM

## 2022-04-16 DIAGNOSIS — R5381 Other malaise: Secondary | ICD-10-CM

## 2022-04-16 MED ORDER — DAPAGLIFLOZIN PROPANEDIOL 10 MG PO TABS: 10.0000 mg | ORAL_TABLET | Freq: Every day | ORAL | 3 refills | Status: DC

## 2022-04-16 NOTE — Patient Instructions (Signed)
Medication Instructions:  No changes *If you need a refill on your cardiac medications before your next appointment, please call your pharmacy*   Lab Work: No Labs If you have labs (blood work) drawn today and your tests are completely normal, you will receive your results only by: [place] (if you have MyChart) OR A paper copy in the mail If you have any lab test that is abnormal or we need to change your treatment, we will call you to review the results.   Testing/Procedures: [place] Your physician has requested that you have an echocardiogram. Echocardiography is a painless test that uses sound waves to create images of your heart. It provides your doctor with information about the size and shape of your heart and how well your heart's chambers and valves are working. This procedure takes approximately one hour. There are no restrictions for this procedure. Please do NOT wear cologne, perfume, aftershave, or lotions (deodorant is allowed). Please arrive 15 minutes prior to your appointment time.    Follow-Up: At [facility], you and your health needs are our priority.  As part of our continuing mission to provide you with exceptional heart care, we have created designated Provider Care Teams.  These Care Teams include your primary Cardiologist (physician) and Advanced Practice Providers (APPs -  Physician Assistants and Nurse Practitioners) who all work together to provide you with the care you need, when you need it.  We recommend signing up for the patient portal called "MyChart".  Sign up information is provided on this After Visit Summary.  MyChart is used to connect with patients for Virtual Visits (Telemedicine).  Patients are able to view lab/test results, encounter notes, upcoming appointments, etc.  Non-urgent messages can be sent to your provider as well.   To learn more about what you can do with MyChart, go to [email or web address].     Your next appointment:   4 month(s)  Provider:   Minus Breeding, MD

## 2022-04-30 ENCOUNTER — Ambulatory Visit (INDEPENDENT_AMBULATORY_CARE_PROVIDER_SITE_OTHER): Payer: Medicare Other

## 2022-04-30 DIAGNOSIS — E1151 Type 2 diabetes mellitus with diabetic peripheral angiopathy without gangrene: Secondary | ICD-10-CM

## 2022-04-30 DIAGNOSIS — I5043 Acute on chronic combined systolic (congestive) and diastolic (congestive) heart failure: Secondary | ICD-10-CM

## 2022-05-01 LAB — ECHOCARDIOGRAM COMPLETE
Area-P 1/2: 2.73 cm2
S' Lateral: 2.99 cm

## 2022-05-04 ENCOUNTER — Ambulatory Visit: Payer: Medicare Other | Admitting: Podiatry

## 2022-05-04 VITALS — BP 126/70

## 2022-05-04 DIAGNOSIS — I739 Peripheral vascular disease, unspecified: Secondary | ICD-10-CM

## 2022-05-04 DIAGNOSIS — L97522 Non-pressure chronic ulcer of other part of left foot with fat layer exposed: Secondary | ICD-10-CM | POA: Diagnosis not present

## 2022-05-04 MED ORDER — CICLOPIROX 8 % EX SOLN: Freq: Every day | CUTANEOUS | 0 refills | Status: DC

## 2022-05-04 MED ORDER — SANTYL 250 UNIT/GM EX OINT: 1.0000 | TOPICAL_OINTMENT | Freq: Every day | CUTANEOUS | 0 refills | Status: DC

## 2022-05-04 NOTE — Patient Instructions (Signed)
Wayne [name] , Thank you for taking time to come for your Medicare Wellness Visit. I appreciate your ongoing commitment to your health goals. Please review the following plan we discussed and let me know if I can assist you in the future.   These are the goals we discussed:  Goals   None     This is a list of the screening recommended for you and due dates:  Health Maintenance  Topic Date Due   Medicare Annual Wellness Visit  Never done   COVID-19 Vaccine (1) Never done   Complete foot exam   Never done   Eye exam for diabetics  Never done   Yearly kidney health urinalysis for diabetes  Never done   DTaP/Tdap/Td vaccine (1 - Tdap) Never done   Zoster (Shingles) Vaccine (1 of 2) Never done   Pneumonia Vaccine (2 - PPSV23 or PCV20) 07/24/2015   Flu Shot  06/27/2022*   Hemoglobin A1C  08/17/2022   Yearly kidney function blood test for diabetes  02/17/2023   HPV Vaccine  Aged Out  *Topic was postponed. The date shown is not the original due date.    Advanced directives: We have a copy of your advanced directives available in your record should your provider ever need to access them.  Conditions/risks identified: Aim for 30 minutes of exercise or brisk walking, 6-8 glasses of water, and 5 servings of fruits and vegetables each day.   Next appointment: Follow up in one year for your annual wellness visit.   Preventive Care 10 Years and Older, Male  Preventive care refers to lifestyle choices and visits with your health care provider that can promote health and wellness. What does preventive care include? A yearly physical exam. This is also called an annual well check. Dental exams once or twice a year. Routine eye exams. Ask your health care provider how often you should have your eyes checked. Personal lifestyle choices, including: Daily care of your teeth and gums. Regular physical activity. Eating a healthy diet. Avoiding tobacco and drug use. Limiting alcohol  use. Practicing safe sex. Taking low doses of aspirin every day. Taking vitamin and mineral supplements as recommended by your health care provider. What happens during an annual well check? The services and screenings done by your health care provider during your annual well check will depend on your age, overall health, lifestyle risk factors, and family history of disease. Counseling  Your health care provider may ask you questions about your: Alcohol use. Tobacco use. Drug use. Emotional well-being. Home and relationship well-being. Sexual activity. Eating habits. History of falls. Memory and ability to understand (cognition). Work and work Statistician. Screening  You may have the following tests or measurements: Height, weight, and BMI. Blood pressure. Lipid and cholesterol levels. These may be checked every 5 years, or more frequently if you are over 10 years old. Skin check. Lung cancer screening. You may have this screening every year starting at age 40 if you have a 30-pack-year history of smoking and currently smoke or have quit within the past 15 years. Fecal occult blood test (FOBT) of the stool. You may have this test every year starting at age 80. Flexible sigmoidoscopy or colonoscopy. You may have a sigmoidoscopy every 5 years or a colonoscopy every 10 years starting at age 67. Prostate cancer screening. Recommendations will vary depending on your family history and other risks. Hepatitis C blood test. Hepatitis B blood test. Sexually transmitted disease (STD) testing. Diabetes screening. This  is done by checking your blood sugar (glucose) after you have not eaten for a while (fasting). You may have this done every 1-3 years. Abdominal aortic aneurysm (AAA) screening. You may need this if you are a current or former smoker. Osteoporosis. You may be screened starting at age 40 if you are at high risk. Talk with your health care provider about your test results,  treatment options, and if necessary, the need for more tests. Vaccines  Your health care provider may recommend certain vaccines, such as: Influenza vaccine. This is recommended every year. Tetanus, diphtheria, and acellular pertussis (Tdap, Td) vaccine. You may need a Td booster every 10 years. Zoster vaccine. You may need this after age 40. Pneumococcal 13-valent conjugate (PCV13) vaccine. One dose is recommended after age 37. Pneumococcal polysaccharide (PPSV23) vaccine. One dose is recommended after age 74. Talk to your health care provider about which screenings and vaccines you need and how often you need them. This information is not intended to replace advice given to you by your health care provider. Make sure you discuss any questions you have with your health care provider. Document Released: 04/11/2015 Document Revised: 12/03/2015 Document Reviewed: 01/14/2015 Elsevier Interactive Patient Education  2017 [place] Prevention in the Home Falls can cause injuries. They can happen to people of all ages. There are many things you can do to make your home safe and to help prevent falls. What can I do on the outside of my home? Regularly fix the edges of walkways and driveways and fix any cracks. Remove anything that might make you trip as you walk through a door, such as a raised step or threshold. Trim any bushes or trees on the path to your home. Use bright outdoor lighting. Clear any walking paths of anything that might make someone trip, such as rocks or tools. Regularly check to see if handrails are loose or broken. Make sure that both sides of any steps have handrails. Any raised decks and porches should have guardrails on the edges. Have any leaves, snow, or ice cleared regularly. Use sand or salt on walking paths during winter. Clean up any spills in your garage right away. This includes oil or grease spills. What can I do in the bathroom? Use night  lights. Install grab bars by the toilet and in the tub and shower. Do not use towel bars as grab bars. Use non-skid mats or decals in the tub or shower. If you need to sit down in the shower, use a plastic, non-slip stool. Keep the floor dry. Clean up any water that spills on the floor as soon as it happens. Remove soap buildup in the tub or shower regularly. Attach bath mats securely with double-sided non-slip rug tape. Do not have throw rugs and other things on the floor that can make you trip. What can I do in the bedroom? Use night lights. Make sure that you have a light by your bed that is easy to reach. Do not use any sheets or blankets that are too big for your bed. They should not hang down onto the floor. Have a firm chair that has side arms. You can use this for support while you get dressed. Do not have throw rugs and other things on the floor that can make you trip. What can I do in the kitchen? Clean up any spills right away. Avoid walking on wet floors. Keep items that you use a lot in easy-to-reach places. If you  need to reach something above you, use a strong step stool that has a grab bar. Keep electrical cords out of the way. Do not use floor polish or wax that makes floors slippery. If you must use wax, use non-skid floor wax. Do not have throw rugs and other things on the floor that can make you trip. What can I do with my stairs? Do not leave any items on the stairs. Make sure that there are handrails on both sides of the stairs and use them. Fix handrails that are broken or loose. Make sure that handrails are as long as the stairways. Check any carpeting to make sure that it is firmly attached to the stairs. Fix any carpet that is loose or worn. Avoid having throw rugs at the top or bottom of the stairs. If you do have throw rugs, attach them to the floor with carpet tape. Make sure that you have a light switch at the top of the stairs and the bottom of the stairs. If  you do not have them, ask someone to add them for you. What else can I do to help prevent falls? Wear shoes that: Do not have high heels. Have rubber bottoms. Are comfortable and fit you well. Are closed at the toe. Do not wear sandals. If you use a stepladder: Make sure that it is fully opened. Do not climb a closed stepladder. Make sure that both sides of the stepladder are locked into place. Ask someone to hold it for you, if possible. Clearly mark and make sure that you can see: Any grab bars or handrails. First and last steps. Where the edge of each step is. Use tools that help you move around (mobility aids) if they are needed. These include: Canes. Walkers. Scooters. Crutches. Turn on the lights when you go into a dark area. Replace any light bulbs as soon as they burn out. Set up your furniture so you have a clear path. Avoid moving your furniture around. If any of your floors are uneven, fix them. If there are any pets around you, be aware of where they are. Review your medicines with your doctor. Some medicines can make you feel dizzy. This can increase your chance of falling. Ask your doctor what other things that you can do to help prevent falls. This information is not intended to replace advice given to you by your health care provider. Make sure you discuss any questions you have with your health care provider. Document Released: 01/09/2009 Document Revised: 08/21/2015 Document Reviewed: 04/19/2014 Elsevier Interactive Patient Education  2017 [employer].

## 2022-05-04 NOTE — Progress Notes (Signed)
Subjective:  Patient ID: Wayne [name], male    DOB: Oct 15, 1940,  MRN: [id number]  Chief Complaint  Patient presents with   Wound Check    Right foot wound has been going on for about 4 weeks the nurse  that comes out to the house wanted them to come in and have it checked     82 y.o. male presents with the above complaint.  Patient presents with left submetatarsal 5 ulceration tomorrow for 4 weeks.  The nurse says that it has been about stable but they wanted to come in and get eval by her foot doctor.  She has not seen MRIs prior to seeing me hurts with ambulation or shoe pressure.  Pain scale is 2 out of 10 dull achy in nature.   Review of Systems: Negative except as noted in the HPI. Denies N/V/F/Ch.  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 Apply over nail and surrounding skin. Apply daily over previous coat. After seven (7) days, may remove with alcohol and continue cycle., Disp: 6.6 mL, Rfl: 0   collagenase (SANTYL) 250 UNIT/GM ointment, Apply 1 Application topically daily., Disp: 15 g, Rfl: 0   Accu-Chek Softclix Lancets lancets, Use 4 times daily as directed to check blood sugars., Disp: 100 each, Rfl: 0   acetaminophen (TYLENOL) 500 MG tablet, Take 2 tablets (1,000 mg total) by mouth every 8 (eight) hours., Disp: 30 tablet, Rfl: 0   blood glucose meter kit and supplies KIT, Dispense based on patient and insurance preference. Use up to four  times daily as directed., Disp: 1 each, Rfl: 0   blood glucose meter kit and supplies KIT, Use up to four times daily as directed., Disp: 1 each, Rfl: 0   Cholecalciferol (VITAMIN D3) 1000 units CAPS, Take 2,000 Units by mouth daily., Disp: , Rfl:    dapagliflozin propanediol (FARXIGA) 10 MG TABS tablet, Take 1 tablet (10 mg total) by mouth daily., Disp: 30 tablet, Rfl: 3   furosemide (LASIX) 40 MG tablet, Take 1 tablet (40 mg total) by mouth daily., Disp: 90 tablet, Rfl: 1   glipiZIDE (GLUCOTROL) 5 MG tablet, Take 1 tablet (5 mg total) by mouth daily before breakfast., Disp: 90 tablet, Rfl: 1   glucose blood test strip, Use as instructed, Disp: 100 each, Rfl: 12   glucose blood test strip, Use 4 times daily as directed to check bloos sugars., Disp: 100 each, Rfl: 0   metoprolol succinate (TOPROL-XL) 25 MG 24 hr tablet, Take 0.5 tablets (12.5 mg total) by mouth daily., Disp: 45 tablet, Rfl: 1   sacubitril-valsartan (ENTRESTO) 49-51 MG, Take 1 tablet by mouth 2 (two) times daily., Disp: 180 tablet, Rfl: 2   Vitamin D, Ergocalciferol, (DRISDOL) 1.25 MG (50000 UNIT) CAPS capsule, Take 1 capsule (50,000 Units total) by mouth every 7 (seven) days., Disp: 5 capsule, Rfl: 3  Social History   Tobacco Use  Smoking Status Former   Types: Cigarettes   Quit date: 1993   Years since quitting: 31.1  Smokeless Tobacco Never    No Known Allergies Objective:   Vitals:   05/04/22 1112  BP: 126/70   There is no height or weight on file to calculate BMI. Constitutional Well developed. Well nourished.  Vascular Dorsalis pedis pulses faintly palpable bilaterally. Posterior tibial pulses faintly palpable bilaterally. Capillary refill normal to all digits.  No cyanosis or clubbing noted. Pedal hair growth normal.  Neurologic Normal speech. Oriented to person, place, and time. Epicritic sensation to light touch grossly present bilaterally.  Dermatologic Left submetatarsal 5 ulceration with fat layer  exposed.  Does not probe down to bone.  No purulent drainage noted no malodor present.  Orthopedic: Normal joint ROM without pain or crepitus bilaterally. No visible deformities. No bony tenderness.   Radiographs: None Assessment:   1. 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 PAD (peripheral artery disease) ([place])    Plan:  Patient was evaluated and treated and all questions answered.  Left submetatarsal 5 ulceration with fat layer exposed -All questions and concerns were discussed with the patient in extensive detail.  Given that this is very stabilized with minimal debridement needed I believe patient will benefit from Santyl wet-to-dry -Santyl was sent to the pharmacy.  No follow-ups on file.

## 2022-05-05 ENCOUNTER — Ambulatory Visit (INDEPENDENT_AMBULATORY_CARE_PROVIDER_SITE_OTHER): Payer: Medicare Other

## 2022-05-05 VITALS — Ht 73.0 in | Wt 175.0 lb

## 2022-05-05 DIAGNOSIS — Z0000 Encounter for general adult medical examination without abnormal findings: Secondary | ICD-10-CM

## 2022-05-05 NOTE — Progress Notes (Signed)
Subjective:   Wayne [name] is a 82 y.o. male who presents for an Initial Medicare Annual Wellness Visit.  I connected with  Wayne [name] on 05/05/22 by a audio enabled telemedicine application and verified that I am speaking with the correct person using two identifiers.  Patient Location: Home  Provider Location: Home Office  I discussed the limitations of evaluation and management by telemedicine. The patient expressed understanding and agreed to proceed.  Review of Systems     Cardiac Risk Factors include: advanced age (>93men, >29 women);diabetes mellitus;dyslipidemia;hypertension;male gender;sedentary lifestyle     Objective:    Today's Vitals   05/05/22 1445  Weight: 175 lb (79.4 kg)  Height: 6\' 1"  (1.854 m)   Body mass index is 23.09 kg/m.     05/05/2022    3:05 PM 11/28/2021    8:59 AM 11/11/2021   10:48 AM  Advanced Directives  Does Patient Have a Medical Advance Directive? Yes No No  Type of 11/13/2021 of [place];Living will    Does patient want to make changes to medical advance directive? No - Patient declined    Copy of Healthcare Power of Attorney in Chart? Yes - validated most recent copy scanned in chart (See row information)    Would patient like information on creating a medical advance directive?  No - Patient declined No - Patient declined    Current Medications (verified) Outpatient Encounter Medications as of 05/05/2022  Medication Sig   Accu-Chek Softclix Lancets lancets Use 4 times daily as directed to check blood sugars.   acetaminophen (TYLENOL) 500 MG tablet Take 2 tablets (1,000 mg total) by mouth every 8 (eight) hours.   blood glucose meter kit and supplies KIT Dispense based on patient and insurance preference. Use up to four times daily as directed.   blood glucose meter kit and supplies KIT Use up to four times daily as directed.   Cholecalciferol (VITAMIN D3) 1000 units CAPS Take 2,000 Units by mouth daily.    ciclopirox (PENLAC) 8 % solution Apply topically at bedtime. Apply over nail and surrounding skin. Apply daily over previous coat. After seven (7) days, may remove with alcohol and continue cycle.   collagenase (SANTYL) 250 UNIT/GM ointment Apply 1 Application topically daily.   dapagliflozin propanediol (FARXIGA) 10 MG TABS tablet Take 1 tablet (10 mg total) by mouth daily.   furosemide (LASIX) 40 MG tablet Take 1 tablet (40 mg total) by mouth daily.   glipiZIDE (GLUCOTROL) 5 MG tablet Take 1 tablet (5 mg total) by mouth daily before breakfast.   glucose blood test strip Use as instructed   glucose blood test strip Use 4 times daily as directed to check bloos sugars.   metoprolol succinate (TOPROL-XL) 25 MG 24 hr tablet Take 0.5 tablets (12.5 mg total) by mouth daily.   sacubitril-valsartan (ENTRESTO) 49-51 MG Take 1 tablet by mouth 2 (two) times daily.   Vitamin D, Ergocalciferol, (DRISDOL) 1.25 MG (50000 UNIT) CAPS capsule Take 1 capsule (50,000 Units total) by mouth every 7 (seven) days.   No facility-administered encounter medications on file as of 05/05/2022.    Allergies (verified) Patient has no known allergies.   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[place])    Encephalopathy    Former tobacco use    PAD (peripheral artery disease) (HCC)    Peripheral neuropathy    SDH (subdural hematoma) (HCC)    Severe pulmonary hypertension ([place])    Past Surgical History:  Procedure Laterality Date   ABDOMINAL AORTOGRAM W/LOWER EXTREMITY Right 11/13/2021   Procedure: ABDOMINAL AORTOGRAM W/LOWER EXTREMITY;  Surgeon: Cherre Robins, MD;  Location: [facility];  Service: Cardiovascular;  Laterality: Right;   CORONARY  ANGIOPLASTY WITH STENT PLACEMENT     Family History  Problem Relation Age of Onset   Cervical cancer Mother    Down syndrome Sister    Diabetes type II Neg Hx    Social History   Socioeconomic History   Marital status: Divorced    Spouse name: Not on file   Number of children: Not on file   Years of education: Not on file   Highest education level: Not on file  Occupational History   Not on file  Tobacco Use   Smoking status: Former    Types: Cigarettes    Quit date: 33    Years since quitting: 31.1   Smokeless tobacco: Never  Vaping Use   Vaping Use: Never used  Substance and Sexual Activity   Alcohol use: No   Drug use: No   Sexual activity: Not Currently  Other Topics Concern   Not on file  Social History Narrative   Not on file   Social Determinants of Health   Financial Resource Strain: Low Risk  (05/05/2022)   Overall Financial Resource Strain (CARDIA)    Difficulty of Paying Living Expenses: Not hard at all  Food Insecurity: No Food Insecurity (05/05/2022)   Hunger Vital Sign    Worried About Running Out of Food in the Last Year: Never true    [place] in the Last Year: Never true  Transportation Needs: No Transportation Needs (05/05/2022)   PRAPARE - Hydrologist (Medical): No    Lack of Transportation (Non-Medical): No  Physical Activity: Inactive (05/05/2022)   Exercise Vital Sign    Days of Exercise per Week: 0 days    Minutes of Exercise per Session: 0 min  Stress: No Stress Concern Present (05/05/2022)   [place]    Feeling of Stress : Not at all  Social Connections: Socially Isolated (05/05/2022)   Social Connection and Isolation Panel [NHANES]    Frequency of Communication with Friends and Family: More than three times a week    Frequency of Social Gatherings with Friends and Family: More than three times a week    Attends Religious Services: Never     Marine scientist or Organizations: No    Attends Music therapist: Never    Marital Status: Divorced    Tobacco Counseling Counseling given: Not Answered   Clinical Intake:  Pre-visit preparation completed: Yes  Pain : No/denies pain  Diabetes: Yes CBG done?: No Did pt. bring in CBG monitor from home?: No  How often do you need to have someone help you when you read instructions, pamphlets, or other written materials from your doctor or pharmacy?: 1 - Never  Diabetic?Yes  Nutrition Risk Assessment:  Has the patient had any N/V/D within the last 2 months?  No  Does the patient have any non-healing wounds?  No  Has the patient had any unintentional weight loss or  weight gain?  No   Diabetes:  Is the patient diabetic?  Yes  If diabetic, was a CBG obtained today?  No  Did the patient bring in their glucometer from home?  No  How often do you monitor your CBG's? Daily .   Financial Strains and Diabetes Management:  Are you having any financial strains with the device, your supplies or your medication? No .  Does the patient want to be seen by Chronic Care Management for management of their diabetes?  No  Would the patient like to be referred to a Nutritionist or for Diabetic Management?  No   Diabetic Exams:  Diabetic Eye Exam: Overdue for diabetic eye exam. Pt has been advised about the importance in completing this exam. Patient advised to call and schedule an eye exam. Diabetic Foot Exam: Overdue, Pt has been advised about the importance in completing this exam. Pt is scheduled for diabetic foot exam on at next office visit .   Interpreter Needed?: No  Information entered by :: Denman George LPN   Activities of Daily Living    05/05/2022    3:05 PM 11/11/2021    8:10 PM  In your present state of health, do you have any difficulty performing the following activities:  Hearing? 1 1  Vision? 0 0  Difficulty concentrating or making decisions? 0  1  Walking or climbing stairs? 0 1  Dressing or bathing? 0 0  Doing errands, shopping? 0 1  Preparing Food and eating ? N   Using the Toilet? N   In the past six months, have you accidently leaked urine? N   Do you have problems with loss of bowel control? N   Managing your Medications? Y   Managing your Finances? N   Housekeeping or managing your Housekeeping? N     Patient Care Team: Baruch Gouty, FNP as PCP - General (Family Medicine) Werner Lean, MD as PCP - Cardiology (Cardiology) Trula Averill Pons, DPM as Consulting Physician (Podiatry)  Indicate any recent Medical Services you may have received from other than [facility] providers in the past year (date may be approximate).     Assessment:   This is a routine wellness examination for Delrae Kinser.  Hearing/Vision screen Hearing Screening - Comments:: Hard of hearing   Vision Screening - Comments:: Wears rx glasses - up to date with routine eye exams    Dietary issues and exercise activities discussed: Current Exercise Habits: The patient does not participate in regular exercise at present   Goals Addressed             This Visit's Progress    Maintain Independence         Depression Screen    05/05/2022    3:04 PM 02/16/2022    2:55 PM  PHQ 2/9 Scores  PHQ - 2 Score 0 0  PHQ- 9 Score  0    Fall Risk    05/05/2022    3:03 PM 02/16/2022    2:55 PM  [place] in the past year? 0 0  Number falls in past yr: 0   Injury with Fall? 0   Follow up Falls prevention discussed;Education provided;Falls evaluation completed     FALL RISK PREVENTION PERTAINING TO THE HOME:  Any stairs in or around the home? No  If so, are there any without handrails? No  Home free of loose throw rugs in walkways, pet beds, electrical cords, etc? Yes  Adequate lighting  in your home to reduce risk of falls? Yes   ASSISTIVE DEVICES UTILIZED TO PREVENT FALLS:  Life alert? No  Use of a cane, walker or w/c? Yes   Grab bars in the bathroom? Yes  Shower chair or bench in shower? No  Elevated toilet seat or a handicapped toilet? Yes   TIMED UP AND GO:  Was the test performed? No . Telephonic visit   Cognitive Function:        05/05/2022    3:06 PM  6CIT Screen  What Year? 0 points  What month? 0 points  What time? 0 points  Count back from 20 0 points  Months in reverse 0 points  Repeat phrase 2 points  Total Score 2 points    Immunizations Immunization History  Administered Date(s) Administered   Pneumococcal Conjugate-13 07/24/2014    TDAP status: Due, Education has been provided regarding the importance of this vaccine. Advised may receive this vaccine at local pharmacy or Health Dept. Aware to provide a copy of the vaccination record if obtained from local pharmacy or Health Dept. Verbalized acceptance and understanding.  Flu Vaccine status: Declined, Education has been provided regarding the importance of this vaccine but patient still declined. Advised may receive this vaccine at local pharmacy or Health Dept. Aware to provide a copy of the vaccination record if obtained from local pharmacy or Health Dept. Verbalized acceptance and understanding.  Pneumococcal vaccine status: Declined,  Education has been provided regarding the importance of this vaccine but patient still declined. Advised may receive this vaccine at local pharmacy or Health Dept. Aware to provide a copy of the vaccination record if obtained from local pharmacy or Health Dept. Verbalized acceptance and understanding.   Covid-19 vaccine status: Declined, Education has been provided regarding the importance of this vaccine but patient still declined. Advised may receive this vaccine at local pharmacy or Health Dept.or vaccine clinic. Aware to provide a copy of the vaccination record if obtained from local pharmacy or Health Dept. Verbalized acceptance and understanding.  Qualifies for Shingles Vaccine? Yes   Zostavax  completed No   Shingrix Completed?: No.    Education has been provided regarding the importance of this vaccine. Patient has been advised to call insurance company to determine out of pocket expense if they have not yet received this vaccine. Advised may also receive vaccine at local pharmacy or Health Dept. Verbalized acceptance and understanding.  Screening Tests Health Maintenance  Topic Date Due   COVID-19 Vaccine (1) Never done   FOOT EXAM  Never done   OPHTHALMOLOGY EXAM  Never done   Diabetic kidney evaluation - Urine ACR  Never done   DTaP/Tdap/Td (1 - Tdap) Never done   Zoster Vaccines- Shingrix (1 of 2) Never done   Pneumonia Vaccine 36+ Years old (2 - PPSV23 or PCV20) 07/24/2015   INFLUENZA VACCINE  06/27/2022 (Originally 10/27/2021)   HEMOGLOBIN A1C  08/17/2022   Diabetic kidney evaluation - eGFR measurement  02/17/2023   Medicare Annual Wellness (AWV)  05/06/2023   HPV VACCINES  Aged Out    Health Maintenance  Health Maintenance Due  Topic Date Due   COVID-19 Vaccine (1) Never done   FOOT EXAM  Never done   OPHTHALMOLOGY EXAM  Never done   Diabetic kidney evaluation - Urine ACR  Never done   DTaP/Tdap/Td (1 - Tdap) Never done   Zoster Vaccines- Shingrix (1 of 2) Never done   Pneumonia Vaccine 75+ Years old (2 - PPSV23 or  PCV20) 07/24/2015    Colorectal cancer screening: No longer required.   Lung Cancer Screening: (Low Dose CT Chest recommended if Age 90-80 years, 30 pack-year currently smoking OR have quit w/in 15years.) does not qualify.   Lung Cancer Screening Referral: n/a   Additional Screening:  Hepatitis C Screening: does not qualify  Vision Screening: Recommended annual ophthalmology exams for early detection of glaucoma and other disorders of the eye. Is the patient up to date with their annual eye exam?  No  Who is the provider or what is the name of the office in which the patient attends annual eye exams? None  If pt is not established with a  provider, would they like to be referred to a provider to establish care? No .   Dental Screening: Recommended annual dental exams for proper oral hygiene  Community Resource Referral / Chronic Care Management: CRR required this visit?  No   CCM required this visit?  No      Plan:     I have personally reviewed and noted the following in the patient's chart:   Medical and social history Use of alcohol, tobacco or illicit drugs  Current medications and supplements including opioid prescriptions. Patient is not currently taking opioid prescriptions. Functional ability and status Nutritional status Physical activity Advanced directives List of other physicians Hospitalizations, surgeries, and ER visits in previous 12 months Vitals Screenings to include cognitive, depression, and falls Referrals and appointments  In addition, I have reviewed and discussed with patient certain preventive protocols, quality metrics, and best practice recommendations. A written personalized care plan for preventive services as well as general preventive health recommendations were provided to patient.     Durwin Nora, [place]   04/04/6158   Due to this being a virtual visit, the after visit summary with patients personalized plan was offered to patient via mail or my-chart.  per request, patient was mailed a copy of AVS.  Nurse Notes: No concerns

## 2022-05-06 ENCOUNTER — Telehealth: Payer: Self-pay | Admitting: *Deleted

## 2022-05-06 NOTE — Telephone Encounter (Addendum)
Wayne [name] is calling for updated wound care recommendations,please advise.updated notes,any other suggestions for patient?

## 2022-05-12 ENCOUNTER — Ambulatory Visit (INDEPENDENT_AMBULATORY_CARE_PROVIDER_SITE_OTHER): Payer: Medicare Other

## 2022-05-12 DIAGNOSIS — M2042 Other hammer toe(s) (acquired), left foot: Secondary | ICD-10-CM

## 2022-05-12 DIAGNOSIS — L97512 Non-pressure chronic ulcer of other part of right foot with fat layer exposed: Secondary | ICD-10-CM

## 2022-05-12 DIAGNOSIS — E1151 Type 2 diabetes mellitus with diabetic peripheral angiopathy without gangrene: Secondary | ICD-10-CM

## 2022-05-12 DIAGNOSIS — M2041 Other hammer toe(s) (acquired), right foot: Secondary | ICD-10-CM

## 2022-05-12 NOTE — Progress Notes (Signed)
Patient presents to the office today for diabetic shoe and insole measuring.  Patient was measured with brannock device to determine size and width for 1 pair of extra depth shoes and foam casted for 3 pair of insoles.   ABN signed.   Documentation of medical necessity will be sent to patient's treating diabetic doctor to verify and sign.   Patient's diabetic provider: Baruch Gouty, FNP treating but sending to [facility], DO to be signed.   Shoes and insoles will be ordered at that time and patient will be notified for an appointment for fitting when they arrive.   Brannock measurement: 12 W  Patient shoe selection-   1st   Shoe choice:   [id number] APEX  2nd  Shoe choice:   525 [id number]  Shoe size ordered: 12 XW TO ACCOMMODATE SWELLING PER WIFES REQUEST

## 2022-05-13 ENCOUNTER — Telehealth: Payer: Self-pay | Admitting: Family Medicine

## 2022-05-14 NOTE — Telephone Encounter (Signed)
Faxed last office notes to [facility] w/ instructions for care for wound care, attn: [name], confirmation received 05/14/2022.

## 2022-05-18 NOTE — Telephone Encounter (Signed)
Chrys Racer calling again to check on this form.

## 2022-05-20 ENCOUNTER — Other Ambulatory Visit: Payer: Self-pay

## 2022-05-20 ENCOUNTER — Ambulatory Visit: Payer: Medicare Other | Admitting: Family Medicine

## 2022-05-20 ENCOUNTER — Emergency Department (HOSPITAL_COMMUNITY): Payer: Medicare Other

## 2022-05-20 ENCOUNTER — Other Ambulatory Visit: Payer: Self-pay | Admitting: Family Medicine

## 2022-05-20 ENCOUNTER — Inpatient Hospital Stay (HOSPITAL_COMMUNITY)
Admission: EM | Admit: 2022-05-20 | Discharge: 2022-05-29 | DRG: 616 | Disposition: A | Discharge status: Home Health | Payer: Medicare Other | Attending: Internal Medicine | Admitting: Internal Medicine

## 2022-05-20 ENCOUNTER — Other Ambulatory Visit: Payer: Self-pay | Admitting: Podiatrist

## 2022-05-20 ENCOUNTER — Encounter (HOSPITAL_COMMUNITY): Payer: Self-pay | Admitting: *Deleted

## 2022-05-20 DIAGNOSIS — M86171 Other acute osteomyelitis, right ankle and foot: Principal | ICD-10-CM | POA: Diagnosis present

## 2022-05-20 DIAGNOSIS — E1151 Type 2 diabetes mellitus with diabetic peripheral angiopathy without gangrene: Secondary | ICD-10-CM | POA: Diagnosis not present

## 2022-05-20 DIAGNOSIS — I96 Gangrene, not elsewhere classified: Secondary | ICD-10-CM | POA: Diagnosis not present

## 2022-05-20 DIAGNOSIS — Z7984 Long term (current) use of oral hypoglycemic drugs: Secondary | ICD-10-CM | POA: Diagnosis not present

## 2022-05-20 DIAGNOSIS — N1831 Chronic kidney disease, stage 3a: Secondary | ICD-10-CM | POA: Diagnosis present

## 2022-05-20 DIAGNOSIS — I2510 Atherosclerotic heart disease of native coronary artery without angina pectoris: Secondary | ICD-10-CM | POA: Diagnosis present

## 2022-05-20 DIAGNOSIS — E1169 Type 2 diabetes mellitus with other specified complication: Secondary | ICD-10-CM | POA: Diagnosis present

## 2022-05-20 DIAGNOSIS — I272 Pulmonary hypertension, unspecified: Secondary | ICD-10-CM | POA: Diagnosis present

## 2022-05-20 DIAGNOSIS — E1152 Type 2 diabetes mellitus with diabetic peripheral angiopathy with gangrene: Secondary | ICD-10-CM | POA: Diagnosis present

## 2022-05-20 DIAGNOSIS — R269 Unspecified abnormalities of gait and mobility: Secondary | ICD-10-CM | POA: Diagnosis present

## 2022-05-20 DIAGNOSIS — B952 Enterococcus as the cause of diseases classified elsewhere: Secondary | ICD-10-CM | POA: Diagnosis present

## 2022-05-20 DIAGNOSIS — Z743 Need for continuous supervision: Secondary | ICD-10-CM | POA: Diagnosis not present

## 2022-05-20 DIAGNOSIS — E1142 Type 2 diabetes mellitus with diabetic polyneuropathy: Secondary | ICD-10-CM | POA: Diagnosis present

## 2022-05-20 DIAGNOSIS — Z87891 Personal history of nicotine dependence: Secondary | ICD-10-CM

## 2022-05-20 DIAGNOSIS — E1122 Type 2 diabetes mellitus with diabetic chronic kidney disease: Secondary | ICD-10-CM | POA: Diagnosis present

## 2022-05-20 DIAGNOSIS — Z7401 Bed confinement status: Secondary | ICD-10-CM | POA: Diagnosis not present

## 2022-05-20 DIAGNOSIS — B965 Pseudomonas (aeruginosa) (mallei) (pseudomallei) as the cause of diseases classified elsewhere: Secondary | ICD-10-CM | POA: Diagnosis present

## 2022-05-20 DIAGNOSIS — R351 Nocturia: Secondary | ICD-10-CM | POA: Diagnosis present

## 2022-05-20 DIAGNOSIS — R6889 Other general symptoms and signs: Secondary | ICD-10-CM | POA: Diagnosis not present

## 2022-05-20 DIAGNOSIS — N179 Acute kidney failure, unspecified: Secondary | ICD-10-CM | POA: Diagnosis present

## 2022-05-20 DIAGNOSIS — E1159 Type 2 diabetes mellitus with other circulatory complications: Secondary | ICD-10-CM | POA: Diagnosis present

## 2022-05-20 DIAGNOSIS — I739 Peripheral vascular disease, unspecified: Secondary | ICD-10-CM | POA: Diagnosis present

## 2022-05-20 DIAGNOSIS — Z794 Long term (current) use of insulin: Secondary | ICD-10-CM

## 2022-05-20 DIAGNOSIS — Z79899 Other long term (current) drug therapy: Secondary | ICD-10-CM

## 2022-05-20 DIAGNOSIS — A480 Gas gangrene: Secondary | ICD-10-CM | POA: Diagnosis present

## 2022-05-20 DIAGNOSIS — E11621 Type 2 diabetes mellitus with foot ulcer: Secondary | ICD-10-CM | POA: Diagnosis present

## 2022-05-20 DIAGNOSIS — Z66 Do not resuscitate: Secondary | ICD-10-CM | POA: Diagnosis present

## 2022-05-20 DIAGNOSIS — M8588 Other specified disorders of bone density and structure, other site: Secondary | ICD-10-CM | POA: Diagnosis present

## 2022-05-20 DIAGNOSIS — L089 Local infection of the skin and subcutaneous tissue, unspecified: Secondary | ICD-10-CM | POA: Diagnosis not present

## 2022-05-20 DIAGNOSIS — Z955 Presence of coronary angioplasty implant and graft: Secondary | ICD-10-CM

## 2022-05-20 DIAGNOSIS — I5042 Chronic combined systolic (congestive) and diastolic (congestive) heart failure: Secondary | ICD-10-CM | POA: Diagnosis present

## 2022-05-20 DIAGNOSIS — L97513 Non-pressure chronic ulcer of other part of right foot with necrosis of muscle: Secondary | ICD-10-CM | POA: Diagnosis present

## 2022-05-20 DIAGNOSIS — I5032 Chronic diastolic (congestive) heart failure: Secondary | ICD-10-CM | POA: Diagnosis not present

## 2022-05-20 DIAGNOSIS — M86172 Other acute osteomyelitis, left ankle and foot: Secondary | ICD-10-CM | POA: Diagnosis present

## 2022-05-20 DIAGNOSIS — D631 Anemia in chronic kidney disease: Secondary | ICD-10-CM | POA: Diagnosis present

## 2022-05-20 DIAGNOSIS — G9341 Metabolic encephalopathy: Secondary | ICD-10-CM | POA: Diagnosis not present

## 2022-05-20 DIAGNOSIS — E440 Moderate protein-calorie malnutrition: Secondary | ICD-10-CM | POA: Diagnosis present

## 2022-05-20 DIAGNOSIS — R531 Weakness: Secondary | ICD-10-CM | POA: Diagnosis not present

## 2022-05-20 DIAGNOSIS — I5043 Acute on chronic combined systolic (congestive) and diastolic (congestive) heart failure: Secondary | ICD-10-CM

## 2022-05-20 DIAGNOSIS — M869 Osteomyelitis, unspecified: Secondary | ICD-10-CM | POA: Diagnosis not present

## 2022-05-20 DIAGNOSIS — I110 Hypertensive heart disease with heart failure: Secondary | ICD-10-CM | POA: Diagnosis not present

## 2022-05-20 DIAGNOSIS — Z9861 Coronary angioplasty status: Secondary | ICD-10-CM | POA: Diagnosis not present

## 2022-05-20 DIAGNOSIS — R0602 Shortness of breath: Secondary | ICD-10-CM | POA: Diagnosis not present

## 2022-05-20 DIAGNOSIS — Z6824 Body mass index (BMI) 24.0-24.9, adult: Secondary | ICD-10-CM

## 2022-05-20 DIAGNOSIS — I509 Heart failure, unspecified: Secondary | ICD-10-CM | POA: Diagnosis not present

## 2022-05-20 LAB — COMPREHENSIVE METABOLIC PANEL
ALT: 13 U/L (ref 0–44)
AST: 15 U/L (ref 15–41)
Albumin: 3.1 g/dL — ABNORMAL LOW (ref 3.5–5.0)
Alkaline Phosphatase: 148 U/L — ABNORMAL HIGH (ref 38–126)
Anion gap: 8 (ref 5–15)
BUN: 38 mg/dL — ABNORMAL HIGH (ref 8–23)
CO2: 26 mmol/L (ref 22–32)
Calcium: 8.1 mg/dL — ABNORMAL LOW (ref 8.9–10.3)
Chloride: 101 mmol/L (ref 98–111)
Creatinine, Ser: 1.69 mg/dL — ABNORMAL HIGH (ref 0.61–1.24)
GFR, Estimated: 40 mL/min — ABNORMAL LOW (ref >60)
Glucose, Bld: 123 mg/dL — ABNORMAL HIGH (ref 70–99)
Potassium: 3.9 mmol/L (ref 3.5–5.1)
Sodium: 135 mmol/L (ref 135–145)
Total Bilirubin: 1 mg/dL (ref 0.3–1.2)
Total Protein: 6.9 g/dL (ref 6.5–8.1)

## 2022-05-20 LAB — CBC WITH DIFFERENTIAL/PLATELET
Abs Immature Granulocytes: 0.04 10*3/uL (ref 0.00–0.07)
Basophils Absolute: 0.1 10*3/uL (ref 0.0–0.1)
Basophils Relative: 1 %
Eosinophils Absolute: 0.2 10*3/uL (ref 0.0–0.5)
Eosinophils Relative: 1 %
HCT: 38 % — ABNORMAL LOW (ref 39.0–52.0)
Hemoglobin: 12.5 g/dL — ABNORMAL LOW (ref 13.0–17.0)
Immature Granulocytes: 0 %
Lymphocytes Relative: 14 %
Lymphs Abs: 1.5 10*3/uL (ref 0.7–4.0)
MCH: 29.1 pg (ref 26.0–34.0)
MCHC: 32.9 g/dL (ref 30.0–36.0)
MCV: 88.6 fL (ref 80.0–100.0)
Monocytes Absolute: 1.1 10*3/uL — ABNORMAL HIGH (ref 0.1–1.0)
Monocytes Relative: 11 %
Neutro Abs: 7.8 10*3/uL — ABNORMAL HIGH (ref 1.7–7.7)
Neutrophils Relative %: 73 %
Platelets: 177 10*3/uL (ref 150–400)
RBC: 4.29 MIL/uL (ref 4.22–5.81)
RDW: 13.1 % (ref 11.5–15.5)
WBC: 10.6 10*3/uL — ABNORMAL HIGH (ref 4.0–10.5)
nRBC: 0 % (ref 0.0–0.2)

## 2022-05-20 LAB — PROTIME-INR
INR: 1.2 (ref 0.8–1.2)
Prothrombin Time: 14.6 seconds (ref 11.4–15.2)

## 2022-05-20 LAB — LACTIC ACID, PLASMA: Lactic Acid, Venous: 1 mmol/L (ref 0.5–1.9)

## 2022-05-20 MED ORDER — SODIUM CHLORIDE 0.9 % IV SOLN
2.0000 g | Freq: Once | INTRAVENOUS | Status: AC
  Administered 2022-05-20: 2 g via INTRAVENOUS
  Filled 2022-05-20: qty 12.5

## 2022-05-20 MED ORDER — VANCOMYCIN HCL IN DEXTROSE 1-5 GM/200ML-% IV SOLN
1000.0000 mg | Freq: Once | INTRAVENOUS | Status: AC
  Administered 2022-05-21: 1000 mg via INTRAVENOUS
  Filled 2022-05-20: qty 200

## 2022-05-20 NOTE — ED Provider Notes (Signed)
[facility] Provider Note   CSN: [id number] Arrival date & time: 05/20/22  1902     History  Chief Complaint  Patient presents with   Cellulitis    [facility] [name] is a 82 y.o. male.  Duplicate note See completed note from encounter       Home Medications Prior to Admission medications   Medication Sig Start Date End Date Taking? Authorizing Provider  Ascorbic Acid (VITAMIN C PO) Take 1 tablet by mouth daily.   Yes [provider]  ciclopirox (PENLAC) 8 % solution Apply topically at bedtime. Apply over nail and surrounding skin. Apply daily over previous coat. After seven (7) days, may remove with alcohol and continue cycle. 05/04/22  Yes Felipa Furnace, DPM  collagenase (SANTYL) 250 UNIT/GM ointment Apply 1 Application topically daily. 05/04/22  Yes Felipa Furnace, DPM  dapagliflozin propanediol (FARXIGA) 10 MG TABS tablet Take 1 tablet (10 mg total) by mouth daily. 04/16/22  Yes Lendon Colonel, NP  furosemide (LASIX) 40 MG tablet Take 1 tablet (40 mg total) by mouth daily. 02/17/22  Yes Rakes, Connye Burkitt, FNP  glipiZIDE (GLUCOTROL) 5 MG tablet TAKE 1 TABLET BY MOUTH DAILY BEFORE BREAKFAST. 05/20/22  Yes Rakes, Connye Burkitt, FNP  metoprolol succinate (TOPROL-XL) 25 MG 24 hr tablet TAKE 1/2 TABLET BY MOUTH EVERY DAY Patient taking differently: Take 12.5 mg by mouth 2 (two) times daily. 05/20/22  Yes Rakes, Connye Burkitt, FNP  Multiple Vitamins-Minerals (ZINC PO) Take 1 tablet by mouth daily.   Yes [provider]  Specialty Vitamins Products (BIOTIN PLUS KERATIN PO) Take 1 tablet by mouth daily.   Yes [provider]  Vitamin D, Ergocalciferol, (DRISDOL) 1.25 MG (50000 UNIT) CAPS capsule Take 1 capsule (50,000 Units total) by mouth every 7 (seven) days. 02/17/22  Yes Rakes, Connye Burkitt, FNP  VITAMIN E PO Take 1 tablet by mouth daily.   Yes [provider]  Accu-Chek Softclix Lancets lancets Use 4 times daily as  directed to check blood sugars. 11/16/21   Patrecia Pour, MD  acetaminophen (TYLENOL) 500 MG tablet Take 2 tablets (1,000 mg total) by mouth every 8 (eight) hours. Patient not taking: Reported on 05/21/2022 12/04/21   Orson Eva, MD  blood glucose meter kit and supplies KIT Dispense based on patient and insurance preference. Use up to four times daily as directed. 11/15/21   Patrecia Pour, MD  blood glucose meter kit and supplies KIT Use up to four times daily as directed. 11/16/21   Patrecia Pour, MD  glucose blood test strip Use as instructed 06/13/12   Noemi Chapel, MD  glucose blood test strip Use 4 times daily as directed to check bloos sugars. 11/16/21   Patrecia Pour, MD  sacubitril-valsartan (ENTRESTO) 49-51 MG Take 1 tablet by mouth 2 (two) times daily. Patient not taking: Reported on 05/21/2022 02/17/22   Werner Lean, MD      Allergies    Patient has no known allergies.    Review of Systems   Review of Systems  Physical Exam Updated Vital Signs BP (!) 152/60 (BP Location: Left Arm)   Pulse 64   Temp 97.7 F (36.5 C) (Oral)   Resp 19   Ht 1.854 m ('6\' 1"'$ )   Wt 79.8 kg   SpO2 98%   BMI 23.21 kg/m  Physical Exam  ED Results / Procedures / Treatments   Labs (all labs ordered are listed, but  only abnormal results are displayed) Labs Reviewed  COMPREHENSIVE METABOLIC PANEL - Abnormal; Notable for the following components:      Result Value   Glucose, Bld 123 (*)    BUN 38 (*)    Creatinine, Ser 1.69 (*)    Calcium 8.1 (*)    Albumin 3.1 (*)    Alkaline Phosphatase 148 (*)    GFR, Estimated 40 (*)    All other components within normal limits  CBC WITH DIFFERENTIAL/PLATELET - Abnormal; Notable for the following components:   WBC 10.6 (*)    Hemoglobin 12.5 (*)    HCT 38.0 (*)    Neutro Abs 7.8 (*)    Monocytes Absolute 1.1 (*)    All other components within normal limits  HEMOGLOBIN A1C - Abnormal; Notable for the following components:   Hgb A1c MFr Bld 5.9  (*)    All other components within normal limits  SEDIMENTATION RATE - Abnormal; Notable for the following components:   Sed Rate 72 (*)    All other components within normal limits  C-REACTIVE PROTEIN - Abnormal; Notable for the following components:   CRP 18.2 (*)    All other components within normal limits  PREALBUMIN - Abnormal; Notable for the following components:   Prealbumin 7 (*)    All other components within normal limits  BASIC METABOLIC PANEL - Abnormal; Notable for the following components:   Glucose, Bld 121 (*)    BUN 34 (*)    Creatinine, Ser 1.49 (*)    Calcium 8.0 (*)    GFR, Estimated 46 (*)    All other components within normal limits  CBC - Abnormal; Notable for the following components:   RBC 4.20 (*)    Hemoglobin 12.2 (*)    HCT 37.6 (*)    All other components within normal limits  GLUCOSE, CAPILLARY - Abnormal; Notable for the following components:   Glucose-Capillary 185 (*)    All other components within normal limits  GLUCOSE, CAPILLARY - Abnormal; Notable for the following components:   Glucose-Capillary 145 (*)    All other components within normal limits  GLUCOSE, CAPILLARY - Abnormal; Notable for the following components:   Glucose-Capillary 187 (*)    All other components within normal limits  GLUCOSE, CAPILLARY - Abnormal; Notable for the following components:   Glucose-Capillary 220 (*)    All other components within normal limits  CBC - Abnormal; Notable for the following components:   WBC 10.8 (*)    Hemoglobin 12.6 (*)    HCT 36.7 (*)    All other components within normal limits  BASIC METABOLIC PANEL - Abnormal; Notable for the following components:   CO2 21 (*)    Glucose, Bld 240 (*)    BUN 39 (*)    Creatinine, Ser 1.47 (*)    Calcium 8.4 (*)    GFR, Estimated 47 (*)    All other components within normal limits  BASIC METABOLIC PANEL - Abnormal; Notable for the following components:   CO2 20 (*)    Glucose, Bld 242 (*)     BUN 44 (*)    Creatinine, Ser 1.61 (*)    Calcium 8.4 (*)    GFR, Estimated 42 (*)    All other components within normal limits  CBC - Abnormal; Notable for the following components:   Hemoglobin 12.6 (*)    HCT 37.1 (*)    All other components within normal limits  GLUCOSE, CAPILLARY - Abnormal; Notable  for the following components:   Glucose-Capillary 294 (*)    All other components within normal limits  GLUCOSE, CAPILLARY - Abnormal; Notable for the following components:   Glucose-Capillary 196 (*)    All other components within normal limits  GLUCOSE, CAPILLARY - Abnormal; Notable for the following components:   Glucose-Capillary 194 (*)    All other components within normal limits  GLUCOSE, CAPILLARY - Abnormal; Notable for the following components:   Glucose-Capillary 230 (*)    All other components within normal limits  CBG MONITORING, ED - Abnormal; Notable for the following components:   Glucose-Capillary 103 (*)    All other components within normal limits  CBG MONITORING, ED - Abnormal; Notable for the following components:   Glucose-Capillary 115 (*)    All other components within normal limits  CBG MONITORING, ED - Abnormal; Notable for the following components:   Glucose-Capillary 147 (*)    All other components within normal limits  CULTURE, BLOOD (ROUTINE X 2)  CULTURE, BLOOD (ROUTINE X 2)  AEROBIC/ANAEROBIC CULTURE W GRAM STAIN (SURGICAL/DEEP WOUND)  AEROBIC/ANAEROBIC CULTURE W GRAM STAIN (SURGICAL/DEEP WOUND)  LACTIC ACID, PLASMA  PROTIME-INR  CBG MONITORING, ED  SURGICAL PATHOLOGY    EKG EKG Interpretation  Date/Time:  Thursday May 20 2022 19:18:45 EST Ventricular Rate:  63 PR Interval:  57 QRS Duration: 99 QT Interval:  453 QTC Calculation: 464 R Axis:   76 Text Interpretation: Sinus rhythm Short PR interval Minimal ST depression, anterolateral leads Confirmed by Fredia Sorrow [phone]) on 05/20/2022 10:05:52 PM  Radiology DG Foot Complete  Right  Result Date: 05/23/2022 CLINICAL DATA:  Postoperative state. EXAM: RIGHT FOOT COMPLETE - 3+ VIEW COMPARISON:  Radiograph 05/20/2022 FINDINGS: Technically limited by positioning. 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 Radiopaque densities projecting over the operative bed may be antibiotic beads. The resection margin appears smooth. There are hammertoe deformity of the digits. No other interval change. 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 Radiopaque densities projecting over the operative bed may be antibiotic beads. Electronically Signed   By: Keith Rake M.D.   On: 05/23/2022 12:35    Procedures Procedures    Medications Ordered in ED Medications  metoprolol succinate (TOPROL-XL) 24 hr tablet 12.5 mg (12.5 mg Oral Given 05/23/22 0927)  insulin aspart (novoLOG) injection 0-6 Units (2 Units Subcutaneous Given 05/23/22 1315)  insulin aspart (novoLOG) injection 0-5 Units ( Subcutaneous MAR Unhold 05/23/22 0903)  cefTRIAXone (ROCEPHIN) 2 g in sodium chloride 0.9 % 100 mL IVPB ( Intravenous MAR Unhold 05/23/22 0903)  metroNIDAZOLE (FLAGYL) tablet 500 mg (500 mg Oral Given 05/23/22 1254)  heparin injection 5,000 Units (5,000 Units Subcutaneous Given 05/23/22 1254)  0.9 %  sodium chloride infusion (0 mLs Intravenous Stopped 05/21/22 0939)  ondansetron (ZOFRAN) tablet 4 mg ( Oral MAR Unhold 05/23/22 0903)    Or  ondansetron (ZOFRAN) injection 4 mg ( Intravenous MAR Unhold 05/23/22 0903)  vancomycin (VANCOREADY) IVPB 1250 mg/250 mL ( Intravenous MAR Unhold 05/23/22 0903)  nutrition supplement (JUVEN) (JUVEN) powder packet 1 packet (1 packet Oral Given 05/23/22 1253)  multivitamin with minerals tablet 1 tablet (1 tablet Oral Given 05/23/22 0927)  protein supplement (ENSURE MAX) liquid (11 oz Oral Given 05/23/22 0931)  0.9 %  sodium chloride infusion ( Intravenous New Bag/Given 05/23/22 1246)  melatonin tablet 3 mg (has no administration in time range)   haloperidol lactate (HALDOL) injection 1 mg (has no administration in time range)  acetaminophen (TYLENOL) tablet 1,000 mg (1,000 mg  Oral Given 05/23/22 1253)  oxyCODONE (Oxy IR/ROXICODONE) immediate release tablet 5 mg (has no administration in time range)  HYDROmorphone (DILAUDID) injection 0.5 mg (has no administration in time range)  vancomycin (VANCOCIN) IVPB 1000 mg/200 mL premix (0 mg Intravenous Stopped 05/21/22 0145)  ceFEPIme (MAXIPIME) 2 g in sodium chloride 0.9 % 100 mL IVPB (0 g Intravenous Stopped 05/21/22 0018)  QUEtiapine (SEROQUEL) tablet 12.5 mg (12.5 mg Oral Given 05/22/22 0117)  chlorhexidine (PERIDEX) 0.12 % solution 15 mL (15 mLs Mouth/Throat Given 05/23/22 0725)    Or  Oral care mouth rinse ( Mouth Rinse See Alternative 05/23/22 0725)    ED Course/ Medical Decision Making/ A&P                             Medical Decision Making Amount and/or Complexity of Data Reviewed Labs: ordered. Radiology: ordered.  Risk Prescription drug management. Decision regarding hospitalization.    Final Clinical Impression(s) / ED Diagnoses Final diagnoses:  Other acute osteomyelitis of right foot [facility])    Rx / DC Orders ED Discharge Orders     None         Fredia Sorrow, MD 05/23/22 1357

## 2022-05-20 NOTE — ED Provider Notes (Signed)
[facility] Provider Note   CSN: [id number] Arrival date & time: 05/20/22  1902     History  Chief Complaint  Patient presents with   Cellulitis    [name] is a 82 y.o. male.  Patient brought in by EMS.  Patient daughter was concerned about infection to his right foot for the past 2 days.  Patient states that it probably been there longer.  Also he has a temp today to 101.  Afebrile here.  Patient lives by himself patient knows that he has a diabetic foot ulcer that been present for about a month.  He daughter says been getting weaker and then started with fevers and that has redness and swelling to the foot.  Black wound underneath the base of the small toe.  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  Patient's status post angioplasty with stent patient quit using tobacco products in 1993.       Home Medications Prior to Admission medications   Medication Sig Start Date End Date Taking? Authorizing Provider  Accu-Chek Softclix Lancets lancets Use 4 times daily as directed to check blood sugars. 11/16/21   Patrecia Pour, MD  acetaminophen (TYLENOL) 500 MG tablet Take 2 tablets (1,000 mg total) by mouth every 8 (eight) hours. 12/04/21   Orson Eva, MD  blood glucose meter kit and supplies KIT Dispense based on patient and insurance preference. Use up to four times daily as directed. 11/15/21   Patrecia Pour, MD  blood glucose meter kit and supplies KIT Use up to four times daily as directed. 11/16/21   Patrecia Pour, MD  Cholecalciferol (VITAMIN D3) 1000 units CAPS Take 2,000 Units by mouth daily.    [provider]  ciclopirox (PENLAC) 8 % solution Apply topically at bedtime. Apply over nail and surrounding skin.  Apply daily over previous coat. After seven (7) days, may remove with alcohol and continue cycle. 05/04/22   Felipa Furnace, DPM  collagenase (SANTYL) 250 UNIT/GM ointment Apply 1 Application topically daily. 05/04/22   Felipa Furnace, DPM  dapagliflozin propanediol (FARXIGA) 10 MG TABS tablet Take 1 tablet (10 mg total) by mouth daily. 04/16/22   Lendon Colonel, NP  furosemide (LASIX) 40 MG tablet Take 1 tablet (40 mg total) by mouth daily. 02/17/22   Baruch Gouty, FNP  glipiZIDE (GLUCOTROL) 5 MG tablet TAKE 1 TABLET BY MOUTH DAILY BEFORE BREAKFAST. 05/20/22   Baruch Gouty, FNP  glucose blood test strip Use as instructed 06/13/12   Noemi Chapel, MD  glucose blood test strip Use 4 times daily as directed to check bloos sugars. 11/16/21   Patrecia Pour, MD  metoprolol succinate (TOPROL-XL) 25 MG 24 hr tablet TAKE 1/2 TABLET BY MOUTH EVERY DAY 05/20/22   Baruch Gouty, FNP  sacubitril-valsartan (ENTRESTO) 49-51 MG Take 1 tablet by mouth 2 (two) times daily. 02/17/22   Chandrasekhar, Terisa Starr, MD  Vitamin D, Ergocalciferol, (DRISDOL) 1.25 MG (50000 UNIT) CAPS capsule Take 1 capsule (50,000 Units total) by mouth every 7 (seven) days. 02/17/22   Baruch Gouty, FNP      Allergies    Patient has no known allergies.    Review of Systems   Review of Systems  Constitutional:  Negative for chills and fever.  HENT:  Negative for ear pain and sore throat.   Eyes:  Negative for pain and visual disturbance.  Respiratory:  Negative for cough and shortness of breath.   Cardiovascular:  Negative for chest pain and palpitations.  Gastrointestinal:  Negative for abdominal pain and vomiting.  Genitourinary:  Negative for dysuria and hematuria.  Musculoskeletal:  Negative for arthralgias and back pain.  Skin:  Positive for wound. Negative for color change and rash.  Neurological:  Negative for seizures and syncope.  All other systems reviewed and are negative.   Physical Exam Updated Vital Signs BP (!)  140/63   Pulse 61   Temp 98.7 F (37.1 C) (Oral)   Resp (!) 21   SpO2 95%  Physical Exam Vitals and nursing note reviewed.  Constitutional:      General: He is not in acute distress.    Appearance: Normal appearance. He is well-developed.  HENT:     Head: Normocephalic and atraumatic.  Eyes:     Conjunctiva/sclera: Conjunctivae normal.  Cardiovascular:     Rate and Rhythm: Normal rate and regular rhythm.     Heart sounds: No murmur heard. Pulmonary:     Effort: Pulmonary effort is normal. No respiratory distress.     Breath sounds: Normal breath sounds.  Abdominal:     Palpations: Abdomen is soft.     Tenderness: There is no abdominal tenderness.  Musculoskeletal:        General: Tenderness present. No swelling or signs of injury.     Cervical back: Neck supple.     Comments: Right foot at the base of the foot sole part of the foot 4 cm black and dry ulcer at the base of the little toe over the fifth metatarsal area.  Foot itself with a lot of erythema on top.  Dorsalis pedis pulse is 1+.  Good cap refill.  No streaking of redness up the leg.  Skin:    General: Skin is warm and dry.     Capillary Refill: Capillary refill takes less than 2 seconds.  Neurological:     General: No focal deficit present.     Mental Status: He is alert and oriented to person, place, and time.  Psychiatric:        Mood and Affect: Mood normal.     ED Results / Procedures / Treatments   Labs (all labs ordered are listed, but only abnormal results are displayed) Labs Reviewed  COMPREHENSIVE METABOLIC PANEL - Abnormal; Notable for the following components:      Result Value   Glucose, Bld 123 (*)    BUN 38 (*)    Creatinine, Ser 1.69 (*)    Calcium 8.1 (*)    Albumin 3.1 (*)    Alkaline Phosphatase 148 (*)    GFR, Estimated 40 (*)    All other components within normal limits  CBC WITH DIFFERENTIAL/PLATELET - Abnormal; Notable for the following components:   WBC 10.6 (*)    Hemoglobin  12.5 (*)    HCT 38.0 (*)    Neutro Abs 7.8 (*)    Monocytes Absolute 1.1 (*)    All other components within normal limits  CULTURE, BLOOD (ROUTINE X 2)  CULTURE, BLOOD (ROUTINE X 2)  LACTIC ACID, PLASMA  PROTIME-INR  URINALYSIS, ROUTINE W REFLEX MICROSCOPIC    EKG EKG Interpretation  Date/Time:  Thursday May 20 2022 19:18:45 EST Ventricular Rate:  63 PR Interval:  57 QRS Duration: 99 QT Interval:  453 QTC  Calculation: 464 R Axis:   76 Text Interpretation: Sinus rhythm Short PR interval Minimal ST depression, anterolateral leads Confirmed by Fredia Sorrow [phone]) on 05/20/2022 10:05:52 PM  Radiology DG Foot Complete Right  Result Date: 05/20/2022 CLINICAL DATA:  Diabetic foot ulcer, sepsis EXAM: RIGHT FOOT COMPLETE - 3+ VIEW COMPARISON:  12/22/2021 FINDINGS: Frontal, oblique, and lateral views of the right foot are obtained. There is soft tissue ulceration overlying the fifth metatarsophalangeal joint, with extensive subcutaneous gas. There is osteopenia surrounding the fifth metatarsophalangeal joint, with erosive changes of the base of the fifth proximal phalanx consistent with osteomyelitis. There are no other acute bony abnormalities. Diffuse osteoarthritis. Marked soft tissue swelling throughout the foot, with extensive atherosclerosis again noted. IMPRESSION: 1. Soft tissue ulceration overlying the fifth metatarsophalangeal joint, with underlying osteopenia and bony erosions consistent with osteomyelitis. 2. Diffuse soft tissue swelling. Electronically Signed   By: Randa Ngo M.D.   On: 05/20/2022 19:52   DG Chest 2 View  Result Date: 05/20/2022 CLINICAL DATA:  Suspected sepsis.  Diabetic foot ulcer. EXAM: CHEST - 2 VIEW COMPARISON:  None 723 FINDINGS: Heart size and pulmonary vascularity are normal. Lungs are clear. No pleural effusions. No pneumothorax. Mediastinal contours appear intact. Calcification of the aorta. Degenerative changes in the spine and shoulders. Old  left rib fractures. IMPRESSION: No active cardiopulmonary disease. Electronically Signed   By: Lucienne Capers M.D.   On: 05/20/2022 19:48    Procedures Procedures    Medications Ordered in ED Medications  vancomycin (VANCOCIN) IVPB 1000 mg/200 mL premix (has no administration in time range)  ceFEPIme (MAXIPIME) 2 g in sodium chloride 0.9 % 100 mL IVPB (2 g Intravenous New Bag/Given 05/20/22 2310)    ED Course/ Medical Decision Making/ A&P                             Medical Decision Making Amount and/or Complexity of Data Reviewed Labs: ordered. Radiology: ordered.  Risk Prescription drug management. Decision regarding hospitalization.  X-ray of the patient's right foot tickly at the base under the small toe where there is a 4 cm darkened ulcer that is dry.  Is consistent with underlying osteomyelitis.  Of the fifth metatarsal phalangeal area.  Lactic acid not elevated.  Foot definitely has cellulitis.  Patient will be started on antibiotic antibiotics for significant cellulitis.  Patient x-ray without any acute findings.  Patient's blood cultures done and pending.  INR normal.  CBC with mild leukocytosis with a white count of 10.6 hemoglobin 12.5 platelets 177 and INR 1.2 complete metabolic panel potassium normal sodium normal blood sugar 123 GFR 40.  LFTs alk phos is elevated at 148 creatinine 1.69.  Discussed with hospitalist who will admit for the cellulitis of the right foot and osteomyelitis.  Patient without septic vital signs.  Lactic acid not elevated.   Final Clinical Impression(s) / ED Diagnoses Final diagnoses:  Other acute osteomyelitis of right foot [facility])    Rx / DC Orders ED Discharge Orders     None         Fredia Sorrow, MD 05/20/22 2344

## 2022-05-20 NOTE — ED Triage Notes (Signed)
Pt arrives by [facility] from home where he lives by himself. EMS reports that daughter states that he has had a diabetic foot ulcer on his left foot x1 month and he has been getting weaker and had some increased confusion and recently he has been feeling generally unwell and had decreased appetite as well as fever of 101F at home. Pt is afebrile at this time but has redness and swelling to right foot and black wound under right small toe.  Pt is alert and oriented to self but unable to give me the date or tell me who the current [place] president is.  When asked why he was brought in he tells me I will have to ask his daughter.

## 2022-05-20 NOTE — ED Notes (Signed)
Warm blankets given, daughter at the bedside

## 2022-05-20 NOTE — ED Notes (Signed)
Pt daughter noticed that he had bilaterally swollen ankles a 2 days ago and she gave him extra diuretic.  Pt has become weaker and daughter states that he had fallen (on to couch) and had some generalized weakness.  Daughter states that she got him some drinks with electrolytes and he seemed to improve after this and was feeling better yesterday but today he was more weak and unable to complete care for his dog or complete his physical therapy today.  Foot has become more and red and more swollen.  Today his temp was 101F

## 2022-05-21 ENCOUNTER — Ambulatory Visit: Payer: Medicare Other | Admitting: Family Medicine

## 2022-05-21 ENCOUNTER — Encounter (HOSPITAL_COMMUNITY): Payer: Self-pay | Admitting: Family Medicine

## 2022-05-21 DIAGNOSIS — N179 Acute kidney failure, unspecified: Secondary | ICD-10-CM | POA: Diagnosis present

## 2022-05-21 DIAGNOSIS — M86171 Other acute osteomyelitis, right ankle and foot: Secondary | ICD-10-CM | POA: Diagnosis not present

## 2022-05-21 DIAGNOSIS — M869 Osteomyelitis, unspecified: Secondary | ICD-10-CM | POA: Insufficient documentation

## 2022-05-21 HISTORY — DX: Acute kidney failure, unspecified: N17.9

## 2022-05-21 LAB — SEDIMENTATION RATE: Sed Rate: 72 mm/hr — ABNORMAL HIGH (ref 0–16)

## 2022-05-21 LAB — BASIC METABOLIC PANEL
Anion gap: 10 (ref 5–15)
BUN: 34 mg/dL — ABNORMAL HIGH (ref 8–23)
CO2: 23 mmol/L (ref 22–32)
Calcium: 8 mg/dL — ABNORMAL LOW (ref 8.9–10.3)
Chloride: 102 mmol/L (ref 98–111)
Creatinine, Ser: 1.49 mg/dL — ABNORMAL HIGH (ref 0.61–1.24)
GFR, Estimated: 46 mL/min — ABNORMAL LOW (ref >60)
Glucose, Bld: 121 mg/dL — ABNORMAL HIGH (ref 70–99)
Potassium: 3.9 mmol/L (ref 3.5–5.1)
Sodium: 135 mmol/L (ref 135–145)

## 2022-05-21 LAB — CBC
HCT: 37.6 % — ABNORMAL LOW (ref 39.0–52.0)
Hemoglobin: 12.2 g/dL — ABNORMAL LOW (ref 13.0–17.0)
MCH: 29 pg (ref 26.0–34.0)
MCHC: 32.4 g/dL (ref 30.0–36.0)
MCV: 89.5 fL (ref 80.0–100.0)
Platelets: 174 10*3/uL (ref 150–400)
RBC: 4.2 MIL/uL — ABNORMAL LOW (ref 4.22–5.81)
RDW: 12.8 % (ref 11.5–15.5)
WBC: 8.8 10*3/uL (ref 4.0–10.5)
nRBC: 0 % (ref 0.0–0.2)

## 2022-05-21 LAB — CBG MONITORING, ED
Glucose-Capillary: 103 mg/dL — ABNORMAL HIGH (ref 70–99)
Glucose-Capillary: 95 mg/dL (ref 70–99)
Glucose-Capillary: 115 mg/dL — ABNORMAL HIGH (ref 70–99)
Glucose-Capillary: 147 mg/dL — ABNORMAL HIGH (ref 70–99)

## 2022-05-21 LAB — HEMOGLOBIN A1C
Hgb A1c MFr Bld: 5.9 % — ABNORMAL HIGH (ref 4.8–5.6)
Mean Plasma Glucose: 122.63 mg/dL

## 2022-05-21 LAB — C-REACTIVE PROTEIN: CRP: 18.2 mg/dL — ABNORMAL HIGH (ref <1.0)

## 2022-05-21 LAB — PREALBUMIN: Prealbumin: 7 mg/dL — ABNORMAL LOW (ref 18–38)

## 2022-05-21 LAB — GLUCOSE, CAPILLARY: Glucose-Capillary: 185 mg/dL — ABNORMAL HIGH (ref 70–99)

## 2022-05-21 MED ORDER — METRONIDAZOLE 500 MG PO TABS
500.0000 mg | ORAL_TABLET | Freq: Two times a day (BID) | ORAL | Status: DC
  Administered 2022-05-21 – 2022-05-25 (×9): 500 mg via ORAL
  Filled 2022-05-21 (×9): qty 1

## 2022-05-21 MED ORDER — INSULIN ASPART 100 UNIT/ML IJ SOLN
0.0000 [IU] | Freq: Every day | INTRAMUSCULAR | Status: DC
  Administered 2022-05-22: 3 [IU] via SUBCUTANEOUS
  Administered 2022-05-23: 2 [IU] via SUBCUTANEOUS

## 2022-05-21 MED ORDER — VANCOMYCIN HCL 1250 MG/250ML IV SOLN
1250.0000 mg | INTRAVENOUS | Status: DC
  Administered 2022-05-21 – 2022-05-24 (×3): 1250 mg via INTRAVENOUS
  Filled 2022-05-21 (×3): qty 250

## 2022-05-21 MED ORDER — ADULT MULTIVITAMIN W/MINERALS CH
1.0000 | ORAL_TABLET | Freq: Every day | ORAL | Status: DC
  Administered 2022-05-21 – 2022-05-29 (×9): 1 via ORAL
  Filled 2022-05-21 (×9): qty 1

## 2022-05-21 MED ORDER — HEPARIN SODIUM (PORCINE) 5000 UNIT/ML IJ SOLN
5000.0000 [IU] | Freq: Three times a day (TID) | INTRAMUSCULAR | Status: DC
  Administered 2022-05-21 – 2022-05-29 (×23): 5000 [IU] via SUBCUTANEOUS
  Filled 2022-05-21 (×24): qty 1

## 2022-05-21 MED ORDER — JUVEN PO PACK
1.0000 | PACK | Freq: Two times a day (BID) | ORAL | Status: DC
  Administered 2022-05-22 – 2022-05-24 (×6): 1 via ORAL
  Filled 2022-05-21 (×7): qty 1

## 2022-05-21 MED ORDER — ONDANSETRON HCL 4 MG PO TABS: 4.0000 mg | ORAL_TABLET | Freq: Four times a day (QID) | ORAL | Status: DC | PRN

## 2022-05-21 MED ORDER — ENSURE MAX PROTEIN PO LIQD
11.0000 [oz_av] | Freq: Every day | ORAL | Status: DC
  Administered 2022-05-22 – 2022-05-24 (×3): 11 [oz_av] via ORAL

## 2022-05-21 MED ORDER — SODIUM CHLORIDE 0.9 % IV SOLN: INTRAVENOUS | Status: AC

## 2022-05-21 MED ORDER — INSULIN ASPART 100 UNIT/ML IJ SOLN
0.0000 [IU] | Freq: Three times a day (TID) | INTRAMUSCULAR | Status: DC
  Administered 2022-05-22: 1 [IU] via SUBCUTANEOUS
  Administered 2022-05-22 – 2022-05-23 (×2): 2 [IU] via SUBCUTANEOUS
  Administered 2022-05-23: 1 [IU] via SUBCUTANEOUS
  Administered 2022-05-23: 2 [IU] via SUBCUTANEOUS
  Administered 2022-05-24: 1 [IU] via SUBCUTANEOUS
  Administered 2022-05-24: 3 [IU] via SUBCUTANEOUS
  Administered 2022-05-24 – 2022-05-29 (×10): 1 [IU] via SUBCUTANEOUS

## 2022-05-21 MED ORDER — ACETAMINOPHEN 650 MG RE SUPP: 650.0000 mg | Freq: Four times a day (QID) | RECTAL | Status: DC | PRN

## 2022-05-21 MED ORDER — ONDANSETRON HCL 4 MG/2ML IJ SOLN
4.0000 mg | Freq: Four times a day (QID) | INTRAMUSCULAR | Status: DC | PRN
  Administered 2022-05-28: 4 mg via INTRAVENOUS
  Filled 2022-05-21: qty 2

## 2022-05-21 MED ORDER — METOPROLOL SUCCINATE ER 25 MG PO TB24
12.5000 mg | ORAL_TABLET | Freq: Every day | ORAL | Status: DC
  Administered 2022-05-21 – 2022-05-29 (×9): 12.5 mg via ORAL
  Filled 2022-05-21 (×9): qty 1

## 2022-05-21 MED ORDER — VANCOMYCIN HCL IN DEXTROSE 1-5 GM/200ML-% IV SOLN: 1000.0000 mg | INTRAVENOUS | Status: DC

## 2022-05-21 MED ORDER — ACETAMINOPHEN 325 MG PO TABS: 650.0000 mg | ORAL_TABLET | Freq: Four times a day (QID) | ORAL | Status: DC | PRN

## 2022-05-21 MED ORDER — SODIUM CHLORIDE 0.9 % IV SOLN
2.0000 g | INTRAVENOUS | Status: DC
  Administered 2022-05-21 – 2022-05-24 (×4): 2 g via INTRAVENOUS
  Filled 2022-05-21 (×4): qty 20

## 2022-05-21 NOTE — ED Notes (Signed)
DNR armband placed on pt L arm

## 2022-05-21 NOTE — Care Plan (Signed)
Updated patient's daughter who requests podiatry to discuss with her  before any surgical intervention

## 2022-05-21 NOTE — Progress Notes (Signed)
PROGRESS NOTE Wayne [name]  [id number] DOB: December 20, 1940 DOA: 05/20/2022 PCP: Baruch Gouty, FNP   Brief Narrative/Hospital Course: 82 y.o.m w/ T2DM, CAD, PAD, pulmonary hypertension, and chronic diastolic CHF presented with right foot infection fatigue and fever. He had a foot ulcer overlying the fifth MTP joint for close to 2 months, followed by podiatry and had recent worsening in surrounding edema/erythema and developed general malaise, fatigue, and fevers.  In ED: he was afebrile and saturating well on room air with normal heart rate and stable blood pressure.  EKG demonstrates sinus rhythm and chest x-ray negative for acute cardiopulmonary disease.  Plain radiographs of the right foot are concerning for ulceration over the fifth MTP with underlying osteomyelitis.Blood cultures were collected in the ED and the patient was treated with cefepime and vancomycin and admitted for further management    Subjective: Seen and examined this morning no new complaints resting comfortably  Has foot pain.  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  He has PAD and saw vascular surgery in September 2023 had angiography performed that did not have any options for reconstruction.  Continue vancomycin's Rocephin and Flagyl and follow-up culture data.  Saw Dr. Posey Pronto on 2/6-for left submetatarsal 5 ulceration with fat layer exposed. I discussed with Dr. Posey Pronto recommending transfer to [facility] for further evaluation and his team will see him in  consult-signed-out out to [facility] team.Patient agreeable.  AKI on CKD stage IIIa: b/l creat 1.3 in November, improving with IV fluids minimize nephrotoxic medication Recent Labs    11/28/21 0925 11/29/21 0328 11/30/21 0401 12/01/21 0433 12/02/21 0425 12/03/21 0419 12/04/21 0513 02/16/22 1546 05/20/22 2013 05/21/22 0302  BUN 22 27* 32* 31* 29* 32* 34* 48* 38* 34*  CREATININE 1.13 1.17 1.21 1.20 1.20 1.13 1.17 1.39* 1.69* 1.49*    CAD: No chest pain, continue beta-blocker  T2DM A1c 5.9: Blood sugar well-controlled continue on SSI Recent Labs  Lab 05/20/22 2013 05/21/22 0807 05/21/22 0859  GLUCAP  --  103* 95  HGBA1C 5.9*  --   --     Chronic diastolic CHF with EF 99991111 and G2 DD earlier this month: Euvolemic holding Entresto and Lasix due to AKI, continue beta-blocker monitor intake output Daily weight  DVT prophylaxis: heparin injection 5,000 Units Start: 05/21/22 0600 Code Status:   Code Status: DNR Family Communication: plan of care discussed with patient at bedside. Patient status is: Inpatient because of right foot infection Level of care: Med-Surg   Dispo: The patient is from: home lives by himself, daughter lives in [place]            Anticipated disposition: Transferring to [facility] for podiatry evaluation  Objective: Vitals last 24 hrs: Vitals:   05/21/22 0855 05/21/22 0900 05/21/22 0956 05/21/22 1000  BP: (!) 152/70 (!) 158/63  (!) 160/61  Pulse: 73 73 80 80  Resp: 19 (!) 22  20  Temp: 98.2 F (36.8 C) 98 F (36.7 C)  98 F (36.7 C)  TempSrc: Oral Oral  Oral  SpO2: 96% 96%  94%  Weight change:   Physical Examination: General exam: alert awake, older than stated age HEENT:Oral mucosa moist, Ear/Nose WNL grossly Respiratory system: bilaterally clear BS, no use of accessory muscle Cardiovascular system: S1 & S2 +, No JVD. Gastrointestinal system: Abdomen soft,NT,ND, BS+ Nervous System:Alert, awake, moving extremities. Extremities: LE edema + on  right foot w/ mild erythema, black eschar covered wound Skin: No rashes,no icterus. MSK: Normal muscle bulk,tone, power  Medications reviewed:  Scheduled Meds:  heparin  5,000 Units Subcutaneous Q8H   insulin aspart  0-5 Units Subcutaneous QHS   insulin aspart  0-6 Units Subcutaneous TID WC   metoprolol succinate  12.5 mg Oral Daily   metroNIDAZOLE  500 mg Oral Q12H   Continuous Infusions:  cefTRIAXone (ROCEPHIN)  IV Stopped (05/21/22 0857)   vancomycin 1,250 mg (05/21/22 0956)      Diet Order             Diet regular Room service appropriate? Yes; Fluid consistency: Thin  Diet effective now                            Intake/Output Summary (Last 24 hours) at 05/21/2022 1119 Last data filed at 05/21/2022 0145 Gross per 24 hour  Intake 300 ml  Output --  Net 300 ml   Net IO Since Admission: 300 mL [05/21/22 1119]  Wt Readings from Last 3 Encounters:  05/05/22 79.4 kg  04/16/22 81.4 kg  02/16/22 79.7 kg     Unresulted Labs (From admission, onward)     Start     Ordered   05/21/22 [id number]  Basic metabolic panel  Daily,   R      05/21/22 0048   05/21/22 0500  CBC  Daily,   R      05/21/22 0048          Data Reviewed: I have personally reviewed following labs and imaging studies CBC: Recent Labs  Lab 05/20/22 2013 05/21/22 0302  WBC 10.6* 8.8  NEUTROABS 7.8*  --   HGB 12.5* 12.2*  HCT 38.0* 37.6*  MCV 88.6 89.5  PLT 177 [id number]   Basic Metabolic Panel: Recent Labs  Lab 05/20/22 2013 05/21/22 0302  NA 135 135  K 3.9 3.9  CL 101 102  CO2 26 23  GLUCOSE 123* 121*  BUN 38* 34*  CREATININE 1.69* 1.49*  CALCIUM 8.1* 8.0*   GFR: CrCl cannot be calculated (Unknown ideal weight.). Liver Function Tests: Recent Labs  Lab 05/20/22 2013  AST 15  ALT 13  ALKPHOS 148*  BILITOT 1.0  PROT 6.9  ALBUMIN 3.1*   No results for input(s): "LIPASE", "AMYLASE" in the last 168 hours. No results for input(s): "AMMONIA" in the last 168 hours. Coagulation  Profile: Recent Labs  Lab 05/20/22 2013  INR 1.2   BNP (last 3 results) No results for input(s): "PROBNP" in the last 8760 hours. HbA1C: Recent Labs    05/20/22 2013  HGBA1C 5.9*   CBG: Recent Labs  Lab 05/21/22 0807 05/21/22 0859  GLUCAP 103* 95   Lipid Profile: No results for input(s): "CHOL", "HDL", "LDLCALC", "TRIG", "CHOLHDL", "LDLDIRECT" in the last 72 hours. Thyroid Function Tests: No results for input(s): "TSH", "T4TOTAL", "FREET4", "T3FREE", "THYROIDAB" in the last 72 hours. Sepsis Labs: Recent Labs  Lab 05/20/22 2013  LATICACIDVEN 1.0    Recent Results (from the past 240 hour(s))  Culture, blood (Routine x 2)     Status: None (Preliminary result)  Collection Time: 05/20/22  8:13 PM   Specimen: BLOOD  Result Value Ref Range Status   Specimen Description BLOOD LEFT ANTECUBITAL  Final   Special Requests   Final    BOTTLES DRAWN AEROBIC AND ANAEROBIC Blood Culture adequate volume   Culture   Final    NO GROWTH < 12 HOURS Performed at [facility], [address], [place], [place] [address]    Report Status PENDING  Incomplete  Culture, blood (Routine x 2)     Status: None (Preliminary result)   Collection Time: 05/20/22  8:13 PM   Specimen: BLOOD  Result Value Ref Range Status   Specimen Description BLOOD BLOOD RIGHT ARM  Final   Special Requests   Final    BOTTLES DRAWN AEROBIC AND ANAEROBIC Blood Culture adequate volume   Culture   Final    NO GROWTH < 12 HOURS Performed at [facility], [address] [place], [place] [address]    Report Status PENDING  Incomplete    Antimicrobials: Anti-infectives (From admission, onward)    Start     Dose/Rate Route Frequency Ordered Stop   05/21/22 1000  vancomycin (VANCOCIN) IVPB 1000 mg/200 mL premix  Status:  Discontinued        1,000 mg 200 mL/hr over 60 Minutes Intravenous Every 48 hours 05/21/22 0143 05/21/22 0148   05/21/22 1000  vancomycin (VANCOREADY) IVPB 1250 mg/250 mL        1,250 mg 166.7  mL/hr over 90 Minutes Intravenous Every 36 hours 05/21/22 0148     05/21/22 0200  metroNIDAZOLE (FLAGYL) tablet 500 mg        500 mg Oral Every 12 hours 05/21/22 0048 05/28/22 0159   05/21/22 0100  cefTRIAXone (ROCEPHIN) 2 g in sodium chloride 0.9 % 100 mL IVPB        2 g 200 mL/hr over 30 Minutes Intravenous Every 24 hours 05/21/22 0048 05/28/22 0059   05/20/22 2245  vancomycin (VANCOCIN) IVPB 1000 mg/200 mL premix        1,000 mg 200 mL/hr over 60 Minutes Intravenous  Once 05/20/22 2241 05/21/22 0145   05/20/22 2245  ceFEPIme (MAXIPIME) 2 g in sodium chloride 0.9 % 100 mL IVPB        2 g 200 mL/hr over 30 Minutes Intravenous  Once 05/20/22 2241 05/21/22 0018      Culture/Microbiology    Component Value Date/Time   SDES BLOOD LEFT ANTECUBITAL 05/20/2022 2013   SDES BLOOD BLOOD RIGHT ARM 05/20/2022 2013   SPECREQUEST  05/20/2022 2013    BOTTLES DRAWN AEROBIC AND ANAEROBIC Blood Culture adequate volume   SPECREQUEST  05/20/2022 2013    BOTTLES DRAWN AEROBIC AND ANAEROBIC Blood Culture adequate volume   CULT  05/20/2022 2013    NO GROWTH < 12 HOURS Performed at [facility], [address], [place], [place] [address]    CULT  05/20/2022 2013    NO GROWTH < 12 HOURS Performed at [facility], [address] [place], [place] [address]    REPTSTATUS PENDING 05/20/2022 2013   REPTSTATUS PENDING 05/20/2022 2013    Other culture-see note  Radiology Studies: DG Foot Complete Right  Result Date: 05/20/2022 CLINICAL DATA:  Diabetic foot ulcer, sepsis EXAM: RIGHT FOOT COMPLETE - 3+ VIEW COMPARISON:  12/22/2021 FINDINGS: Frontal, oblique, and lateral views of the right foot are obtained. There is soft tissue ulceration overlying the fifth metatarsophalangeal joint, with extensive subcutaneous gas. There is osteopenia surrounding the fifth metatarsophalangeal joint, with erosive  changes of the base of the fifth proximal phalanx consistent with osteomyelitis. There are no other acute bony  abnormalities. Diffuse osteoarthritis. Marked soft tissue swelling throughout the foot, with extensive atherosclerosis again noted. IMPRESSION: 1. Soft tissue ulceration overlying the fifth metatarsophalangeal joint, with underlying osteopenia and bony erosions consistent with osteomyelitis. 2. Diffuse soft tissue swelling. Electronically Signed   By: Randa Ngo M.D.   On: 05/20/2022 19:52   DG Chest 2 View  Result Date: 05/20/2022 CLINICAL DATA:  Suspected sepsis.  Diabetic foot ulcer. EXAM: CHEST - 2 VIEW COMPARISON:  None 723 FINDINGS: Heart size and pulmonary vascularity are normal. Lungs are clear. No pleural effusions. No pneumothorax. Mediastinal contours appear intact. Calcification of the aorta. Degenerative changes in the spine and shoulders. Old left rib fractures. IMPRESSION: No active cardiopulmonary disease. Electronically Signed   By: Lucienne Capers M.D.   On: 05/20/2022 19:48     LOS: 1 day   Antonieta Pert, MD [facility]  05/21/2022, 11:19 AM

## 2022-05-21 NOTE — Hospital Course (Addendum)
82 y.o.m w/ T2DM, CAD, PAD, pulmonary hypertension, and chronic diastolic CHF presented with right foot infection fatigue and fever. He had a foot ulcer overlying the fifth MTP joint for close to 2 months, followed by podiatry and had recent worsening in surrounding edema/erythema and developed general malaise, fatigue, and fevers.  In ED: he was afebrile and saturating well on room air with normal heart rate and stable blood pressure.  EKG demonstrates sinus rhythm and chest x-ray negative for acute cardiopulmonary disease.  Plain radiographs of the right foot are concerning for ulceration over the fifth MTP with underlying osteomyelitis.Blood cultures were collected in the ED and the patient was treated with cefepime and vancomycin and admitted for further management

## 2022-05-21 NOTE — Inpatient Diabetes Management (Signed)
Inpatient Diabetes Program Recommendations  AACE/ADA: New Consensus Statement on Inpatient Glycemic Control  Target Ranges:  Prepandial:   less than 140 mg/dL      Peak postprandial:   less than 180 mg/dL (1-2 hours)      Critically ill patients:  140 - 180 mg/dL    Latest Reference Range & Units 05/20/22 20:13 05/21/22 03:02  Glucose 70 - 99 mg/dL 123 (H) 121 (H)    Latest Reference Range & Units 02/16/22 15:26  HB A1C (BAYER DCA - WAIVED) 4.8 - 5.6 % 7.0 (H)   Review of Glycemic Control  Diabetes history: DM2 Outpatient Diabetes medications: Glipizide 5 mg QAM Current orders for Inpatient glycemic control: Novolog 0-6 units TID with meals, Novolog 0-5 units QHS  Inpatient Diabetes Program Recommendations:    HbgA1C: Current A1C in process. Last A1C in chart 7% on 02/16/22.   Insulin: Agree with inpatient orders.  NOTE: Noted consult for diabetes coordinator per lower extremity wound order set. Lab glucose 121-123 mg/dl since arrival to hospital and last A1C 7% indicating good DM control.  Agree with using very sensitive Novolog correction while inpatient. Will sign off consult. Please re-consult if needed.  Thanks, Barnie Alderman, RN, MSN, [place] Diabetes Coordinator Inpatient Diabetes Program [phone] (Team Pager from 8am to [place])

## 2022-05-21 NOTE — H&P (Addendum)
History and Physical    Wayne [name] [id number] DOB: 05-May-1940 DOA: 05/20/2022  PCP: Baruch Gouty, FNP   Patient coming from: Home   Chief Complaint: Foot infection, fatigue, fever   HPI: Wayne [name] is a pleasant 82 y.o. male with medical history significant for type 2 diabetes mellitus, CAD, PAD, pulmonary hypertension, and chronic diastolic CHF who presents to the emergency department with worsening right foot infection, fatigue, and fevers.  Patient has had a foot ulcer overlying the fifth MTP joint for close to 2 months now that has been followed by podiatry.  He reports recent worsening with surrounding edema and erythema.  He has also developed general malaise, fatigue, and fevers.  He denied chest pain, cough, shortness of breath, or dysuria.  ED Course: Upon arrival to the ED, patient is found to be afebrile and saturating well on room air with normal heart rate and stable blood pressure.  EKG demonstrates sinus rhythm and chest x-ray negative for acute cardiopulmonary disease.  Plain radiographs of the right foot are concerning for ulceration over the fifth MTP with underlying osteomyelitis.   Blood cultures were collected in the ED and the patient was treated with cefepime and vancomycin.  Review of Systems:  All other systems reviewed and apart from HPI, are negative.  Past Medical History:  Diagnosis Date   Anemia of chronic disease    Chronic HFrEF (heart failure with reduced ejection fraction) (HCC)    Chronic kidney disease, stage 3a (HCC)    Coronary artery disease    Diabetes mellitus with circulatory complication (HCC)    Diabetic foot infection ([place])    Encephalopathy    Former tobacco use    PAD (peripheral artery disease) (HCC)    Peripheral neuropathy    SDH (subdural hematoma) (HCC)    Severe pulmonary hypertension ([place])     Past Surgical History:  Procedure Laterality Date   ABDOMINAL AORTOGRAM W/LOWER EXTREMITY Right 11/13/2021    Procedure: ABDOMINAL AORTOGRAM W/LOWER EXTREMITY;  Surgeon: Cherre Robins, MD;  Location: [facility];  Service: Cardiovascular;  Laterality: Right;   CORONARY ANGIOPLASTY WITH STENT PLACEMENT      Social History:   reports that he quit smoking about 31 years ago. His smoking use included cigarettes. He has never used smokeless tobacco. He reports that he does not drink alcohol and does not use drugs.  No Known Allergies  Family History  Problem Relation Age of Onset   Cervical cancer Mother    Down syndrome Sister    Diabetes type II Neg Hx      Prior to Admission medications   Medication Sig Start Date End Date Taking? Authorizing Provider  Accu-Chek Softclix Lancets lancets Use 4 times daily as directed to check blood sugars. 11/16/21   Patrecia Pour, MD  acetaminophen (TYLENOL) 500 MG tablet Take 2 tablets (1,000 mg total) by mouth every 8 (eight) hours. 12/04/21   Orson Eva, MD  blood glucose meter kit and supplies KIT Dispense based on patient and insurance preference. Use up to four times daily as directed. 11/15/21   Patrecia Pour, MD  blood glucose meter kit and supplies KIT Use up to four times daily as directed. 11/16/21   Patrecia Pour, MD  Cholecalciferol (VITAMIN D3) 1000 units CAPS Take 2,000 Units by mouth daily.    [provider]  ciclopirox (PENLAC) 8 % solution Apply topically at bedtime. Apply over nail and surrounding skin. Apply daily over previous coat.  After seven (7) days, may remove with alcohol and continue cycle. 05/04/22   Felipa Furnace, DPM  collagenase (SANTYL) 250 UNIT/GM ointment Apply 1 Application topically daily. 05/04/22   Felipa Furnace, DPM  dapagliflozin propanediol (FARXIGA) 10 MG TABS tablet Take 1 tablet (10 mg total) by mouth daily. 04/16/22   Lendon Colonel, NP  furosemide (LASIX) 40 MG tablet Take 1 tablet (40 mg total) by mouth daily. 02/17/22   Baruch Gouty, FNP  glipiZIDE (GLUCOTROL) 5 MG tablet TAKE 1 TABLET BY MOUTH  DAILY BEFORE BREAKFAST. 05/20/22   Baruch Gouty, FNP  glucose blood test strip Use as instructed 06/13/12   Noemi Chapel, MD  glucose blood test strip Use 4 times daily as directed to check bloos sugars. 11/16/21   Patrecia Pour, MD  metoprolol succinate (TOPROL-XL) 25 MG 24 hr tablet TAKE 1/2 TABLET BY MOUTH EVERY DAY 05/20/22   Baruch Gouty, FNP  sacubitril-valsartan (ENTRESTO) 49-51 MG Take 1 tablet by mouth 2 (two) times daily. 02/17/22   Chandrasekhar, Terisa Starr, MD  Vitamin D, Ergocalciferol, (DRISDOL) 1.25 MG (50000 UNIT) CAPS capsule Take 1 capsule (50,000 Units total) by mouth every 7 (seven) days. 02/17/22   Baruch Gouty, FNP    Physical Exam: Vitals:   05/20/22 2230 05/20/22 2300 05/21/22 0000 05/21/22 0030  BP: (!) 145/61 (!) 140/63 (!) 149/62 (!) 146/56  Pulse:   65 63  Resp: 20 (!) 21 17 (!) 22  Temp:  98.7 F (37.1 C)    TempSrc:  Oral    SpO2:  95% 94% 92%    Constitutional: NAD, calm  Eyes: PERTLA, lids and conjunctivae normal ENMT: Mucous membranes are moist. Posterior pharynx clear of any exudate or lesions.   Neck: supple, no masses  Respiratory: no wheezing, no crackles. No accessory muscle use.  Cardiovascular: S1 & S2 heard, regular rate and rhythm. No JVD. Abdomen: No distension, no tenderness, soft. Bowel sounds active.  Musculoskeletal: no clubbing / cyanosis. No joint deformity upper and lower extremities.   Skin: Ulcer overlying 5th MTP on right foot with surrounding edema, erythema, and heat. Skin otherwise warm, dry, well-perfused. Neurologic: CN 2-12 grossly intact. Moving all extremities. Alert and oriented.  Psychiatric: Pleasant. Cooperative.    Labs and Imaging on Admission: I have personally reviewed following labs and imaging studies  CBC: Recent Labs  Lab 05/20/22 2013  WBC 10.6*  NEUTROABS 7.8*  HGB 12.5*  HCT 38.0*  MCV 88.6  PLT [id number]   Basic Metabolic Panel: Recent Labs  Lab 05/20/22 2013  NA 135  K 3.9  CL 101  CO2 26   GLUCOSE 123*  BUN 38*  CREATININE 1.69*  CALCIUM 8.1*   GFR: CrCl cannot be calculated (Unknown ideal weight.). Liver Function Tests: Recent Labs  Lab 05/20/22 2013  AST 15  ALT 13  ALKPHOS 148*  BILITOT 1.0  PROT 6.9  ALBUMIN 3.1*   No results for input(s): "LIPASE", "AMYLASE" in the last 168 hours. No results for input(s): "AMMONIA" in the last 168 hours. Coagulation Profile: Recent Labs  Lab 05/20/22 2013  INR 1.2   Cardiac Enzymes: No results for input(s): "CKTOTAL", "CKMB", "CKMBINDEX", "TROPONINI" in the last 168 hours. BNP (last 3 results) No results for input(s): "PROBNP" in the last 8760 hours. HbA1C: No results for input(s): "HGBA1C" in the last 72 hours. CBG: No results for input(s): "GLUCAP" in the last 168 hours. Lipid Profile: No results for input(s): "CHOL", "HDL", "LDLCALC", "TRIG", "  CHOLHDL", "LDLDIRECT" in the last 72 hours. Thyroid Function Tests: No results for input(s): "TSH", "T4TOTAL", "FREET4", "T3FREE", "THYROIDAB" in the last 72 hours. Anemia Panel: No results for input(s): "VITAMINB12", "FOLATE", "FERRITIN", "TIBC", "IRON", "RETICCTPCT" in the last 72 hours. Urine analysis:    Component Value Date/Time   COLORURINE YELLOW 11/28/2021 [place] 11/28/2021 0949   LABSPEC 1.022 11/28/2021 0949   PHURINE 5.0 11/28/2021 0949   GLUCOSEU >=500 (A) 11/28/2021 0949   HGBUR SMALL (A) 11/28/2021 0949   BILIRUBINUR NEGATIVE 11/28/2021 0949   KETONESUR 20 (A) 11/28/2021 0949   PROTEINUR 100 (A) 11/28/2021 0949   UROBILINOGEN 0.2 06/13/2012 0325   NITRITE NEGATIVE 11/28/2021 0949   LEUKOCYTESUR NEGATIVE 11/28/2021 0949   Sepsis Labs: '@LABRCNTIP'$ (procalcitonin:4,lacticidven:4) ) Recent Results (from the past 240 hour(s))  Culture, blood (Routine x 2)     Status: None (Preliminary result)   Collection Time: 05/20/22  8:13 PM   Specimen: BLOOD  Result Value Ref Range Status   Specimen Description BLOOD LEFT ANTECUBITAL  Final    Special Requests   Final    BOTTLES DRAWN AEROBIC AND ANAEROBIC Blood Culture adequate volume Performed at [facility], [address], [place], [place] [address]    Culture PENDING  Incomplete   Report Status PENDING  Incomplete  Culture, blood (Routine x 2)     Status: None (Preliminary result)   Collection Time: 05/20/22  8:13 PM   Specimen: BLOOD  Result Value Ref Range Status   Specimen Description BLOOD BLOOD RIGHT ARM  Final   Special Requests   Final    BOTTLES DRAWN AEROBIC AND ANAEROBIC Blood Culture adequate volume Performed at [facility], [address], [place], [place] [address]    Culture PENDING  Incomplete   Report Status PENDING  Incomplete     Radiological Exams on Admission: DG Foot Complete Right  Result Date: 05/20/2022 CLINICAL DATA:  Diabetic foot ulcer, sepsis EXAM: RIGHT FOOT COMPLETE - 3+ VIEW COMPARISON:  12/22/2021 FINDINGS: Frontal, oblique, and lateral views of the right foot are obtained. There is soft tissue ulceration overlying the fifth metatarsophalangeal joint, with extensive subcutaneous gas. There is osteopenia surrounding the fifth metatarsophalangeal joint, with erosive changes of the base of the fifth proximal phalanx consistent with osteomyelitis. There are no other acute bony abnormalities. Diffuse osteoarthritis. Marked soft tissue swelling throughout the foot, with extensive atherosclerosis again noted. IMPRESSION: 1. Soft tissue ulceration overlying the fifth metatarsophalangeal joint, with underlying osteopenia and bony erosions consistent with osteomyelitis. 2. Diffuse soft tissue swelling. Electronically Signed   By: Randa Ngo M.D.   On: 05/20/2022 19:52   DG Chest 2 View  Result Date: 05/20/2022 CLINICAL DATA:  Suspected sepsis.  Diabetic foot ulcer. EXAM: CHEST - 2 VIEW COMPARISON:  None 723 FINDINGS: Heart size and pulmonary vascularity are normal. Lungs are clear. No pleural effusions. No pneumothorax. Mediastinal contours  appear intact. Calcification of the aorta. Degenerative changes in the spine and shoulders. Old left rib fractures. IMPRESSION: No active cardiopulmonary disease. Electronically Signed   By: Lucienne Capers M.D.   On: 05/20/2022 19:48    EKG: Independently reviewed. Sinus rhythm.   Assessment/Plan   1. Diabetic right foot infection with osteomyelitis  - Blood cultures collected in ED and broad-spectrum antibiotics started  - Pt has PAD and saw vascular surgery in September '23 for right foot ulcer, had angiography performed, but did not have any options for reconstruction  - Treat with vancomycin, Rocephin, and Flagyl, follow  cultures, consult ortho or podiatry in am    2. AKI superimposed on CKD IIIa  - SCr is 1.69 on admission, up from 1.39 in November and 1.17 in September 2023  - Suspect acute prerenal azotemia in setting of recent loss of appetite and extra Lasix given by family for foot swelling  - Hold Entresto and Lasix, start gentle IVF hydration, renally-dose medications, repeat chem panel in am   3. CAD  - No anginal complaints, continue beta-blocker    4. Type II DM  - A1c was 7.0% in November 2023  - Check CBGs and use low-intensity SSI for now    5. Chronic diastolic CHF  - EF was 99991111 with grade 2 diastolic dysfunction on TTE earlier this month  - Appears euvolemic  - Hold Entresto and Lasix in light of AKI, monitor wt and I/Os, continue beta-blocker    DVT prophylaxis: sq heparin  Code Status: DNR  Level of Care: Level of care: Med-Surg Family Communication: Daughter updated from ED  Disposition Plan:  Patient is from: Home  Anticipated d/c is to: TBD Anticipated d/c date is: 05/24/22  Patient currently: pending treatment of foot infection  Consults called: none  Admission status: Inpatient     Vianne Bulls, MD [facility]  05/21/2022, 12:48 AM

## 2022-05-21 NOTE — Progress Notes (Addendum)
Pharmacy Antibiotic Note  Wayne [name] is a 82 y.o. male admitted on 05/20/2022 with RLE osteomyelitis.  Pharmacy has been consulted for Vancomycin dosing.  Vancomycin 1 g IV given in ED at [facility]: Vancomycin  1250 mg IV q36h, , next dose today at 10 am.     Temp (24hrs), Avg:98.6 F (37 C), Min:98.4 F (36.9 C), Max:98.7 F (37.1 C)  Recent Labs  Lab 05/20/22 2013  WBC 10.6*  CREATININE 1.69*  LATICACIDVEN 1.0    CrCl cannot be calculated (Unknown ideal weight.).    No Known Allergies   Caryl Pina 05/21/2022 1:40 AM

## 2022-05-21 NOTE — Progress Notes (Addendum)
Initial Nutrition Assessment  DOCUMENTATION CODES:   Not applicable  INTERVENTION:   -Obtain wt -Continue with regular diet for widest variety of meal selections -MVI with minerals daily -1 packet Juven BID, each packet provides 95 calories, 2.5 grams of protein (collagen), and 9.8 grams of carbohydrate (3 grams sugar); also contains 7 grams of L-arginine and L-glutamine, 300 mg vitamin C, 15 mg vitamin E, 1.2 mcg vitamin B-12, 9.5 mg zinc, 200 mg calcium, and 1.5 g  Calcium Beta-hydroxy-Beta-methylbutyrate to support wound healing  -Ensure Max po daily, each supplement provides 150 kcal and 30 grams of protein.    NUTRITION DIAGNOSIS:   Increased nutrient needs related to wound healing as evidenced by estimated needs.  GOAL:   Patient will meet greater than or equal to 90% of their needs  MONITOR:   PO intake, Supplement acceptance  REASON FOR ASSESSMENT:   Consult Wound healing  ASSESSMENT:   Pt with medical history significant for type 2 diabetes mellitus, CAD, PAD, pulmonary hypertension, and chronic diastolic CHF who presents with worsening right foot infection, fatigue, and fevers.  Pt admitted with DM infection to rt foot with osteomyelitis.   Reviewed I/O's: +300 ml x 24 hours   Pt awaiting orthopedics/ podiatry consult. Plan to transfer to [facility] for further evaluation.   Pt unavailable at time of visit. Attempted to speak with pt via call to hospital room phone, however, unable to reach. RD unable to obtain further nutrition-related history or complete nutrition-focused physical exam at this time.    Pt currently on a regular diet. No meal completion data available to assess at this time. Given advanced age, increased nutritional needs for wound healing, and good glycemic control, agree with liberalized diet to help maximize oral intake.   Reviewed wt hx; pt has experienced a 7.1% wt loss over the past 6 months, which is not significant for time frame. No  new wt ordered for this admission. RD will order new wt to better assess acute weight changes. Pt is at risk for malnutrition, however, unable to identify at this time. Pt would greatly benefit from addition of oral nutrition supplements.   Lab Results  Component Value Date   HGBA1C 5.9 (H) 05/20/2022   PTA DM medications are 10 mg farxiga daily and 5 mg glipizide daily.   Labs reviewed: CBGS: 95-115 (inpatient orders for glycemic control are 0-5 units insulin aspart daily at bedtime and 0-6 units insulin aspart TID).    Diet Order:   Diet Order             Diet regular Room service appropriate? Yes; Fluid consistency: Thin  Diet effective now                   EDUCATION NEEDS:   Not appropriate for education at this time  Skin:  Skin Assessment: Skin Integrity Issues: Skin Integrity Issues:: Diabetic Ulcer Diabetic Ulcer: rt foot with osteomyelitis  Last BM:  Unknown  Height:   Ht Readings from Last 1 Encounters:  05/05/22 6' 1"$  (1.854 m)    Weight:   Wt Readings from Last 1 Encounters:  05/05/22 79.4 kg    Ideal Body Weight:  83.6 kg  BMI:  There is no height or weight on file to calculate BMI.  Estimated Nutritional Needs:   Kcal:  2050-2250  Protein:  105-120 grams  Fluid:  > 2 L    Loistine Chance, RD, LDN, [place] Registered Dietitian II Certified Diabetes Care and Education  Specialist Please refer to [facility] for RD and/or RD on-call/weekend/after hours pager

## 2022-05-21 NOTE — Addendum Note (Signed)
Addended by: Boneta Lucks on: 05/21/2022 07:42 AM   Modules accepted: Orders

## 2022-05-21 NOTE — ED Notes (Signed)
Helped pt up to toilet in room to void, pt not able to void at this time.  Pt helped back to bed, blankets given and urinal placed at bedside

## 2022-05-21 NOTE — Progress Notes (Addendum)
Patient Wayne [name] arrived with [facility] at 2030.  Denies pain and discomfort.  On 2 liters of oxygen per transport for decreased O2 stats at [facility].  Unstagable area noted at the right side of foot.  Redness noted at the top of right foot.  Continue to monitor.

## 2022-05-22 DIAGNOSIS — M86171 Other acute osteomyelitis, right ankle and foot: Secondary | ICD-10-CM | POA: Diagnosis not present

## 2022-05-22 DIAGNOSIS — I96 Gangrene, not elsewhere classified: Secondary | ICD-10-CM

## 2022-05-22 LAB — CBC
HCT: 37.8 % — ABNORMAL LOW (ref 39.0–52.0)
HCT: 36.7 % — ABNORMAL LOW (ref 39.0–52.0)
Hemoglobin: 12.8 g/dL — ABNORMAL LOW (ref 13.0–17.0)
Hemoglobin: 12.6 g/dL — ABNORMAL LOW (ref 13.0–17.0)
MCH: 29.4 pg (ref 26.0–34.0)
MCH: 29.3 pg (ref 26.0–34.0)
MCHC: 33.9 g/dL (ref 30.0–36.0)
MCHC: 34.3 g/dL (ref 30.0–36.0)
MCV: 86.9 fL (ref 80.0–100.0)
MCV: 85.3 fL (ref 80.0–100.0)
Platelets: 209 10*3/uL (ref 150–400)
Platelets: 194 10*3/uL (ref 150–400)
RBC: 4.35 MIL/uL (ref 4.22–5.81)
RBC: 4.3 MIL/uL (ref 4.22–5.81)
RDW: 12.9 % (ref 11.5–15.5)
RDW: 13.1 % (ref 11.5–15.5)
WBC: 12 10*3/uL — ABNORMAL HIGH (ref 4.0–10.5)
WBC: 10.8 10*3/uL — ABNORMAL HIGH (ref 4.0–10.5)
nRBC: 0 % (ref 0.0–0.2)
nRBC: 0 % (ref 0.0–0.2)

## 2022-05-22 LAB — BASIC METABOLIC PANEL
Anion gap: 16 — ABNORMAL HIGH (ref 5–15)
Anion gap: 11 (ref 5–15)
BUN: 32 mg/dL — ABNORMAL HIGH (ref 8–23)
BUN: 39 mg/dL — ABNORMAL HIGH (ref 8–23)
CO2: 17 mmol/L — ABNORMAL LOW (ref 22–32)
CO2: 21 mmol/L — ABNORMAL LOW (ref 22–32)
Calcium: 8.4 mg/dL — ABNORMAL LOW (ref 8.9–10.3)
Calcium: 8.4 mg/dL — ABNORMAL LOW (ref 8.9–10.3)
Chloride: 102 mmol/L (ref 98–111)
Chloride: 103 mmol/L (ref 98–111)
Creatinine, Ser: 1.68 mg/dL — ABNORMAL HIGH (ref 0.61–1.24)
Creatinine, Ser: 1.47 mg/dL — ABNORMAL HIGH (ref 0.61–1.24)
GFR, Estimated: 40 mL/min — ABNORMAL LOW (ref >60)
GFR, Estimated: 47 mL/min — ABNORMAL LOW (ref >60)
Glucose, Bld: 178 mg/dL — ABNORMAL HIGH (ref 70–99)
Glucose, Bld: 240 mg/dL — ABNORMAL HIGH (ref 70–99)
Potassium: 3.9 mmol/L (ref 3.5–5.1)
Potassium: 4.1 mmol/L (ref 3.5–5.1)
Sodium: 135 mmol/L (ref 135–145)
Sodium: 135 mmol/L (ref 135–145)

## 2022-05-22 LAB — GLUCOSE, CAPILLARY
Glucose-Capillary: 145 mg/dL — ABNORMAL HIGH (ref 70–99)
Glucose-Capillary: 187 mg/dL — ABNORMAL HIGH (ref 70–99)
Glucose-Capillary: 220 mg/dL — ABNORMAL HIGH (ref 70–99)
Glucose-Capillary: 294 mg/dL — ABNORMAL HIGH (ref 70–99)

## 2022-05-22 MED ORDER — QUETIAPINE FUMARATE 25 MG PO TABS
12.5000 mg | ORAL_TABLET | Freq: Once | ORAL | Status: AC
  Administered 2022-05-22: 12.5 mg via ORAL
  Filled 2022-05-22: qty 1

## 2022-05-22 NOTE — Consult Note (Addendum)
[facility] Nurse Consult Note: Reason for Consult:right foot, 5th metatarsal head full thickness wound with nonviable tissue. Wound type:Neuropathic Pressure Injury POA: N/A Measurement:To be obtained by Bedside RN and documented on Nursing Flow Sheet with next dressing Dressing procedure/placement/frequency: Topical care for this are will be to cleanse daily with NS, pat dry and cover with xeroform gauze (antimicrobial, nonadherent) and to secure with Kerlix roll gauze/paper tape. Feet are to be placed into Prevalon boots. A sacral foam is to be placed for PI prevention.  If more aggressive wound interventions are desired, consider consult to orthopedic provider.  [facility] nursing team will not follow, but will remain available to this patient, the nursing and medical teams.  Please re-consult if needed.  Thank you for inviting [place] to participate in this patient's Plan of Care.  Maudie Flakes, MSN, RN, CNS, [place], Serita Grammes, [employer], [employer] phone:  [phone]

## 2022-05-22 NOTE — Consult Note (Signed)
PODIATRY CONSULTATION  NAME Wayne [name] MRN [id number] DOB May 31, 1940 DOA 05/20/2022   Reason for consult:  Chief Complaint  Patient presents with   Cellulitis    Attending/Consulting physician: Dahal MD  History of present illness: 82 y.o. male w/ T2DM, CAD, PAD, pulmonary hypertension, and chronic diastolic CHF presented with right foot infection fatigue and fever. He had a foot ulcer overlying the fifth MTP joint for close to 2 months, followed by podiatry and had recent worsening in surrounding edema/erythema and developed general malaise, fatigue, and fevers. Last seen by Dr. Posey Pronto 2/6. Has previously had right 2nd toe ulcer and gangrene that was treated conservatively. Has had angiogram done by vascular last August and at subsequent follow up was told no further revasc options existed, single vessel peroneal runoff to right foot. Per his daughter who I spoke to on phone pts wound has gotten much worse " blown up" in the past 2 weeks.   Past Medical History:  Diagnosis Date   Anemia of chronic disease    Chronic HFrEF (heart failure with reduced ejection fraction) (HCC)    Chronic kidney disease, stage 3a (HCC)    Coronary artery disease    Diabetes mellitus with circulatory complication (HCC)    Diabetic foot infection ([place])    Encephalopathy    Former tobacco use    PAD (peripheral artery disease) (HCC)    Peripheral neuropathy    SDH (subdural hematoma) (HCC)    Severe pulmonary hypertension (HCC)        Latest Ref Rng & Units 05/21/2022    3:02 AM 05/20/2022    8:13 PM 02/16/2022    3:46 PM  CBC  WBC 4.0 - 10.5 K/uL 8.8  10.6  5.6   Hemoglobin 13.0 - 17.0 g/dL 12.2  12.5  15.1   Hematocrit 39.0 - 52.0 % 37.6  38.0  44.2   Platelets 150 - 400 K/uL 174  177  140        Latest Ref Rng & Units 05/21/2022    3:02 AM 05/20/2022    8:13 PM 02/16/2022    3:46 PM  BMP  Glucose 70 - 99 mg/dL 121  123  215   BUN 8 - 23 mg/dL 34  38  48   Creatinine 0.61 - 1.24  mg/dL 1.49  1.69  1.39   BUN/Creat Ratio 10 - 24   35   Sodium 135 - 145 mmol/L 135  135  141   Potassium 3.5 - 5.1 mmol/L 3.9  3.9  4.4   Chloride 98 - 111 mmol/L 102  101  105   CO2 22 - 32 mmol/L '23  26  19   '$ Calcium 8.9 - 10.3 mg/dL 8.0  8.1  9.2       Physical Exam: Lower Extremity Exam Vasc: R - PT non palpable, DP non palpable. Cap refill < 3 sec to digits    Derm: R - Necrotic unstageable ulceraiton with eschar overlying at the plantar aspect of the right foot with erythema and edea of the 5th MPJ and 5th toe. Purplish discoloration dorsally. Worse than prior pictures clinically. No necrosis or open wound of 2nd toe but there is healed soft tissue defect.   MSK:  R - Edema of the right lateral forefoot   Neuro: R - Gross sensation absent. Gross motor function intact      ASSESSMENT/PLAN OF CARE 82 y.o. 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   AF, VSS WBC 8.4 ESR 72, CRP 18.2 XR foot R: 1. Soft tissue ulceration overlying the fifth metatarsophalangeal joint, with underlying osteopenia and bony erosions consistent with osteomyelitis.  - NPO past MN for OR tomorrow AM 730. Discussed with pt and he is in agreement. Plan for I&D and possible partial 5th ray amputation pending family decision. High risk for non healing of any surgical intervention given single vessel runoff with no revasc options for RLE. - Continue IV abx broad spectrum pending further culture data - Anticoagulation: hold pending PR - Wound care: betadine gauze dressing to right foot  - WB status: WBAT to R foot pre op - Will continue to follow  - Had an extensive discussion with the patients daughter Wayne [name] over the phone regarding the nature of infection and fast progression. We discussed possible partial 5th ray amputation which would be my  recommendation. This is versus more limited approach to open area, remove 4th met head and part of 5th proximal phalanx but leave toe and debrde the ulcer. However I discussed high risk of nonhealing of any surgery whether that be I&D or partial ray amputation which could end up necessitating more proximal amputation BKA. Possible need for staged procedure. I do recommend some form of I&D tomorrow to get source control, believe pt will ultimately need partial 5th ray amputation but am willing to try more limited surgical approach if they desire, with the understanding of risk of incomplete eradication of infection leading to worsening infection/ sepsis/ more proximal amputation. Wayne [name] states she will further discuss with her father and decision on what exactly they want done will be discussed again pre op tmrw.    Thank you for the consult.  Please contact me directly with any questions or concerns.           Everitt Amber, DPM [facility]    [address], [place] [address]                Office [phone]  Fax [phone]

## 2022-05-22 NOTE — Progress Notes (Addendum)
PROGRESS NOTE  Wayne [name]  DOB: 1941-03-10  PCP: Baruch Gouty, [place] [id number]  DOA: 05/20/2022  LOS: 2 days  Hospital Day: 3  Brief narrative: Wayne [name] is a 82 y.o. male with PMH significant for DM2, CAD, PAD, severe pulmonary hypertension, CHF, subdural hematoma, peripheral neuropathy  Patient has PAD and was seen by vascular surgery in September 2023.  He had angiography performed but did not have any options for reconstruction.  He had a foot ulcer overlying the right fifth MTP joint for close to 2 months, followed by podiatry. 2/6, he was seen by podiatrist Dr. Posey Pronto and to have right submetatarsal 5 ulceration with fat layer exposed.  2/22, patient was brought to the ED for worsening wound, weakness, confusion, fatigue, fever of 101.   In the ED, afebrile, hemodynamically stable.  WBC count elevated 10.6. X-ray right foot are concerning for ulceration over the fifth MTP with underlying osteomyelitis. Blood cultures were collected in the ED Started on IV cefepime and IV vancomycin Admitted to [facility]  Subjective: Patient was seen and examined this morning.  Pleasant elderly Caucasian male.  Lying on bed.  Not in distress no new symptoms. I called and discussed with his daughter later in the morning. Patient is DNR at this time.  Daughter wants to switch him to full code for the time of surgery.  If he makes it through the surgery, she intends to change to DNR again. Chart reviewed In the last 24 hours, no fever, blood pressure mostly elevated to 160s  Assessment and plan: Acute osteomyelitis of right foot Diabetic foot ulcer Peripheral artery disease Presented with worsening right foot wound for 2 months.   Culture sent. Currently on IV Rocephin, IV vancomycin and oral Flagyl. WBC count improved.  No fever. Podiatry following.  Noted a plan of surgery tomorrow. Recent Labs  Lab 05/20/22 2013 05/21/22 0302  WBC 10.6* 8.8  LATICACIDVEN 1.0  --    AKI  on CKD stage IIIa b/l creat 1.3 in 01/2022. Presented with creatinine elevated to 1.69.  Gradually improving on IV fluid.  Continue to monitor. Recent Labs    11/28/21 0925 11/29/21 0328 11/30/21 0401 12/01/21 0433 12/02/21 0425 12/03/21 0419 12/04/21 0513 02/16/22 1546 05/20/22 2013 05/21/22 0302  BUN 22 27* 32* 31* 29* 32* 34* 48* 38* 34*  CREATININE 1.13 1.17 1.21 1.20 1.20 1.13 1.17 1.39* 1.69* 1.49*   Type 2 diabetes mellitus A1c 5.9 in 05/20/2022 PTA on Farxiga 10 mg daily, glipizide 5 mg daily Currently on sliding scale insulin with Accu-Cheks.  Blood sugar trend as below. Recent Labs  Lab 05/21/22 0859 05/21/22 1229 05/21/22 1806 05/21/22 2239 05/22/22 0741  GLUCAP 95 115* 147* 185* 145*    Chronic diastolic CHF  Clinically euvolemic.   Recent echo with EF 50-55% and G2 DD PTA on Toprol 12.5 mg twice daily, Farxiga 10 g daily, Lasix 40 mg daily. Reportedly not taking Entresto. Continue Toprol.  Others on hold.  CAD Patient has not had any chest pain, shortness of breath or any anginal symptoms.  No need of further ischemia evaluation prior to procedure. Continue beta-blocker. Discussed with patient and his daughter about the risk and benefits of the procedure.  At this time, the benefit of procedure is higher than the perceived risk.   Mobility: Needs PT evaluation postprocedure  Goals of care Patient is DNR at this time.  Daughter wants to switch him to full code for the time of surgery.  If he makes  it through the surgery, she intends to change to DNR again.   Code Status: Full Code .    DVT prophylaxis:  heparin injection 5,000 Units Start: 05/21/22 0600   Antimicrobials: IV Rocephin Fluid: None Consultants: Podiatry Family Communication: Discussed with patient's daughter on the phone  Status is: Inpatient Level of care: Med-Surg   Dispo: Patient is from: Home              Anticipated d/c is to: Pending course Continue in-hospital care  because: OR tomorrow   Scheduled Meds:  heparin  5,000 Units Subcutaneous Q8H   insulin aspart  0-5 Units Subcutaneous QHS   insulin aspart  0-6 Units Subcutaneous TID WC   metoprolol succinate  12.5 mg Oral Daily   metroNIDAZOLE  500 mg Oral Q12H   multivitamin with minerals  1 tablet Oral Daily   nutrition supplement (JUVEN)  1 packet Oral BID BM   Ensure Max Protein  11 oz Oral Daily    PRN meds: acetaminophen **OR** acetaminophen, ondansetron **OR** ondansetron (ZOFRAN) IV   Infusions:   cefTRIAXone (ROCEPHIN)  IV 2 g (05/22/22 0006)   vancomycin Stopped (05/21/22 1220)    Diet:  Diet Order             Diet NPO time specified  Diet effective midnight           Diet regular Room service appropriate? No; Fluid consistency: Thin  Diet effective now                   Antimicrobials: Anti-infectives (From admission, onward)    Start     Dose/Rate Route Frequency Ordered Stop   05/21/22 1000  vancomycin (VANCOCIN) IVPB 1000 mg/200 mL premix  Status:  Discontinued        1,000 mg 200 mL/hr over 60 Minutes Intravenous Every 48 hours 05/21/22 0143 05/21/22 0148   05/21/22 1000  vancomycin (VANCOREADY) IVPB 1250 mg/250 mL        1,250 mg 166.7 mL/hr over 90 Minutes Intravenous Every 36 hours 05/21/22 0148     05/21/22 0200  metroNIDAZOLE (FLAGYL) tablet 500 mg        500 mg Oral Every 12 hours 05/21/22 0048 05/28/22 0159   05/21/22 0100  cefTRIAXone (ROCEPHIN) 2 g in sodium chloride 0.9 % 100 mL IVPB        2 g 200 mL/hr over 30 Minutes Intravenous Every 24 hours 05/21/22 0048 05/28/22 0059   05/20/22 2245  vancomycin (VANCOCIN) IVPB 1000 mg/200 mL premix        1,000 mg 200 mL/hr over 60 Minutes Intravenous  Once 05/20/22 2241 05/21/22 0145   05/20/22 2245  ceFEPIme (MAXIPIME) 2 g in sodium chloride 0.9 % 100 mL IVPB        2 g 200 mL/hr over 30 Minutes Intravenous  Once 05/20/22 2241 05/21/22 0018       Skin assessment:       Nutritional status:  Body  mass index is 23.21 kg/m.  Nutrition Problem: Increased nutrient needs Etiology: wound healing Signs/Symptoms: estimated needs     Objective: Vitals:   05/21/22 2354 05/22/22 0738  BP: (!) 131/49 (!) 141/63  Pulse: 82 77  Resp: 18 20  Temp: 98.3 F (36.8 C) 97.7 F (36.5 C)  SpO2: 96% 94%    Intake/Output Summary (Last 24 hours) at 05/22/2022 1102 Last data filed at 05/22/2022 1043 Gross per 24 hour  Intake 1280 ml  Output 450 ml  Net 830 ml   Filed Weights   05/22/22 0000  Weight: 79.8 kg   Weight change:  Body mass index is 23.21 kg/m.   Physical Exam: General exam: Pleasant, elderly Caucasian male.  Not in distress Skin: No rashes, lesions or ulcers. HEENT: Atraumatic, normocephalic, no obvious bleeding Lungs: Clear to auscultation bilaterally CVS: Regular rate and rhythm, no murmur GI/Abd soft, nontender, nondistended, bowel sound present CNS: Alert, awake, oriented to place and person Psychiatry: Mood appropriate Extremities: No pedal edema, no calf tenderness.  Right foot ulcer as described above  Data Review: I have personally reviewed the laboratory data and studies available.  F/u labs ordered Unresulted Labs (From admission, onward)     Start     Ordered   05/21/22 [id number]  Basic metabolic panel  Daily,   R      05/21/22 0048   05/21/22 0500  CBC  Daily,   R      05/21/22 0048            Total time spent in review of labs and imaging, patient evaluation, formulation of plan, documentation and communication with family: 78 minutes  Signed, Terrilee Croak, MD [facility] 05/22/2022

## 2022-05-23 ENCOUNTER — Other Ambulatory Visit: Payer: Self-pay

## 2022-05-23 ENCOUNTER — Inpatient Hospital Stay (HOSPITAL_COMMUNITY): Payer: Medicare Other | Admitting: Anesthesiology

## 2022-05-23 ENCOUNTER — Encounter (HOSPITAL_COMMUNITY): Payer: Self-pay | Admitting: Family Medicine

## 2022-05-23 ENCOUNTER — Inpatient Hospital Stay (HOSPITAL_COMMUNITY): Payer: Medicare Other

## 2022-05-23 ENCOUNTER — Encounter (HOSPITAL_COMMUNITY)
Admission: EM | Disposition: A | Discharge status: Home Health | Payer: Self-pay | Source: Home / Self Care | Attending: Internal Medicine

## 2022-05-23 DIAGNOSIS — M869 Osteomyelitis, unspecified: Secondary | ICD-10-CM | POA: Diagnosis not present

## 2022-05-23 DIAGNOSIS — Z7984 Long term (current) use of oral hypoglycemic drugs: Secondary | ICD-10-CM

## 2022-05-23 DIAGNOSIS — M86171 Other acute osteomyelitis, right ankle and foot: Secondary | ICD-10-CM | POA: Diagnosis not present

## 2022-05-23 DIAGNOSIS — E1169 Type 2 diabetes mellitus with other specified complication: Secondary | ICD-10-CM | POA: Diagnosis not present

## 2022-05-23 DIAGNOSIS — I96 Gangrene, not elsewhere classified: Secondary | ICD-10-CM

## 2022-05-23 DIAGNOSIS — E1152 Type 2 diabetes mellitus with diabetic peripheral angiopathy with gangrene: Secondary | ICD-10-CM

## 2022-05-23 HISTORY — PX: AMPUTATION: SHX166

## 2022-05-23 LAB — GLUCOSE, CAPILLARY
Glucose-Capillary: 196 mg/dL — ABNORMAL HIGH (ref 70–99)
Glucose-Capillary: 194 mg/dL — ABNORMAL HIGH (ref 70–99)
Glucose-Capillary: 230 mg/dL — ABNORMAL HIGH (ref 70–99)
Glucose-Capillary: 221 mg/dL — ABNORMAL HIGH (ref 70–99)
Glucose-Capillary: 234 mg/dL — ABNORMAL HIGH (ref 70–99)

## 2022-05-23 LAB — CBC
HCT: 37.1 % — ABNORMAL LOW (ref 39.0–52.0)
Hemoglobin: 12.6 g/dL — ABNORMAL LOW (ref 13.0–17.0)
MCH: 29 pg (ref 26.0–34.0)
MCHC: 34 g/dL (ref 30.0–36.0)
MCV: 85.5 fL (ref 80.0–100.0)
Platelets: 208 10*3/uL (ref 150–400)
RBC: 4.34 MIL/uL (ref 4.22–5.81)
RDW: 12.9 % (ref 11.5–15.5)
WBC: 10.2 10*3/uL (ref 4.0–10.5)
nRBC: 0 % (ref 0.0–0.2)

## 2022-05-23 LAB — BASIC METABOLIC PANEL
Anion gap: 11 (ref 5–15)
BUN: 44 mg/dL — ABNORMAL HIGH (ref 8–23)
CO2: 20 mmol/L — ABNORMAL LOW (ref 22–32)
Calcium: 8.4 mg/dL — ABNORMAL LOW (ref 8.9–10.3)
Chloride: 105 mmol/L (ref 98–111)
Creatinine, Ser: 1.61 mg/dL — ABNORMAL HIGH (ref 0.61–1.24)
GFR, Estimated: 42 mL/min — ABNORMAL LOW (ref >60)
Glucose, Bld: 242 mg/dL — ABNORMAL HIGH (ref 70–99)
Potassium: 4 mmol/L (ref 3.5–5.1)
Sodium: 136 mmol/L (ref 135–145)

## 2022-05-23 SURGERY — AMPUTATION, FOOT, RAY
Anesthesia: Monitor Anesthesia Care | Site: Fifth Toe | Laterality: Right

## 2022-05-23 MED ORDER — FENTANYL CITRATE (PF) 100 MCG/2ML IJ SOLN: 25.0000 ug | INTRAMUSCULAR | Status: DC | PRN

## 2022-05-23 MED ORDER — LIDOCAINE 2% (20 MG/ML) 5 ML SYRINGE
INTRAMUSCULAR | Status: DC | PRN
  Administered 2022-05-23: 60 mg via INTRAVENOUS

## 2022-05-23 MED ORDER — VANCOMYCIN HCL 500 MG IV SOLR
INTRAVENOUS | Status: AC
  Filled 2022-05-23: qty 10

## 2022-05-23 MED ORDER — PROPOFOL 10 MG/ML IV BOLUS
INTRAVENOUS | Status: AC
  Filled 2022-05-23: qty 20

## 2022-05-23 MED ORDER — MELATONIN 3 MG PO TABS
3.0000 mg | ORAL_TABLET | Freq: Every day | ORAL | Status: DC
  Administered 2022-05-23 – 2022-05-28 (×6): 3 mg via ORAL
  Filled 2022-05-23 (×6): qty 1

## 2022-05-23 MED ORDER — TOBRAMYCIN SULFATE 80 MG/2ML IJ SOLN
INTRAMUSCULAR | Status: DC | PRN
  Administered 2022-05-23: 60 mg via INTRAMUSCULAR

## 2022-05-23 MED ORDER — HALOPERIDOL LACTATE 5 MG/ML IJ SOLN: 1.0000 mg | Freq: Four times a day (QID) | INTRAMUSCULAR | Status: DC | PRN

## 2022-05-23 MED ORDER — LIDOCAINE 2% (20 MG/ML) 5 ML SYRINGE
INTRAMUSCULAR | Status: AC
  Filled 2022-05-23: qty 5

## 2022-05-23 MED ORDER — OXYCODONE HCL 5 MG PO TABS
5.0000 mg | ORAL_TABLET | Freq: Four times a day (QID) | ORAL | Status: DC | PRN
  Administered 2022-05-24: 5 mg via ORAL
  Filled 2022-05-23: qty 1

## 2022-05-23 MED ORDER — BUPIVACAINE HCL (PF) 0.5 % IJ SOLN
INTRAMUSCULAR | Status: DC | PRN
  Administered 2022-05-23: 5 mL

## 2022-05-23 MED ORDER — CHLORHEXIDINE GLUCONATE 0.12 % MT SOLN
OROMUCOSAL | Status: AC
  Administered 2022-05-23: 15 mL via OROMUCOSAL
  Filled 2022-05-23: qty 15

## 2022-05-23 MED ORDER — LACTATED RINGERS IV SOLN: INTRAVENOUS | Status: DC

## 2022-05-23 MED ORDER — LIDOCAINE HCL (PF) 2 % IJ SOLN
INTRAMUSCULAR | Status: AC
  Filled 2022-05-23: qty 10

## 2022-05-23 MED ORDER — FENTANYL CITRATE (PF) 250 MCG/5ML IJ SOLN
INTRAMUSCULAR | Status: AC
  Filled 2022-05-23: qty 5

## 2022-05-23 MED ORDER — HYDROMORPHONE HCL 1 MG/ML IJ SOLN: 0.5000 mg | INTRAMUSCULAR | Status: DC | PRN

## 2022-05-23 MED ORDER — INSULIN ASPART 100 UNIT/ML IJ SOLN: 0.0000 [IU] | INTRAMUSCULAR | Status: DC | PRN

## 2022-05-23 MED ORDER — PROPOFOL 500 MG/50ML IV EMUL
INTRAVENOUS | Status: DC | PRN
  Administered 2022-05-23: 75 ug/kg/min via INTRAVENOUS

## 2022-05-23 MED ORDER — TOBRAMYCIN SULFATE 80 MG/2ML IJ SOLN
INTRAMUSCULAR | Status: AC
  Filled 2022-05-23: qty 4

## 2022-05-23 MED ORDER — BUPIVACAINE HCL (PF) 0.5 % IJ SOLN
INTRAMUSCULAR | Status: AC
  Filled 2022-05-23: qty 10

## 2022-05-23 MED ORDER — CHLORHEXIDINE GLUCONATE 0.12 % MT SOLN: 15.0000 mL | Freq: Once | OROMUCOSAL | Status: AC

## 2022-05-23 MED ORDER — PROPOFOL 10 MG/ML IV BOLUS
INTRAVENOUS | Status: DC | PRN
  Administered 2022-05-23 (×2): 25 mg via INTRAVENOUS

## 2022-05-23 MED ORDER — ORAL CARE MOUTH RINSE: 15.0000 mL | Freq: Once | OROMUCOSAL | Status: AC

## 2022-05-23 MED ORDER — SODIUM CHLORIDE 0.9 % IV SOLN: INTRAVENOUS | Status: DC

## 2022-05-23 MED ORDER — ACETAMINOPHEN 500 MG PO TABS
1000.0000 mg | ORAL_TABLET | Freq: Three times a day (TID) | ORAL | Status: DC
  Administered 2022-05-23 – 2022-05-28 (×15): 1000 mg via ORAL
  Filled 2022-05-23 (×15): qty 2

## 2022-05-23 MED ORDER — VANCOMYCIN HCL 500 MG IV SOLR
INTRAVENOUS | Status: DC | PRN
  Administered 2022-05-23: 500 mg

## 2022-05-23 SURGICAL SUPPLY — 50 items
APL PRP STRL LF DISP 70% ISPRP (MISCELLANEOUS)
BAG COUNTER SPONGE SURGICOUNT (BAG) ×1 IMPLANT
BAG SPNG CNTER NS LX DISP (BAG) ×1
BLADE OSCIL/SAGITTAL W/10 ST (BLADE) IMPLANT
BLADE SAW SGTL NAR THIN XSHT (BLADE) IMPLANT
BLADE SURG 15 STRL LF DISP TIS (BLADE) IMPLANT
BLADE SURG 15 STRL SS (BLADE)
BNDG ELASTIC 4X5.8 VLCR STR LF (GAUZE/BANDAGES/DRESSINGS) IMPLANT
BNDG GAUZE DERMACEA FLUFF 4 (GAUZE/BANDAGES/DRESSINGS) ×1 IMPLANT
BNDG GZE DERMACEA 4 6PLY (GAUZE/BANDAGES/DRESSINGS) ×1
CHLORAPREP W/TINT 26 (MISCELLANEOUS) IMPLANT
CNTNR URN SCR LID CUP LEK RST (MISCELLANEOUS) IMPLANT
CONT SPEC 4OZ STRL OR WHT (MISCELLANEOUS) ×2
COVER SURGICAL LIGHT HANDLE (MISCELLANEOUS) ×1 IMPLANT
CUFF TOURN SGL QUICK 18X4 (TOURNIQUET CUFF) ×1 IMPLANT
CUFF TOURN SGL QUICK 24 (TOURNIQUET CUFF)
CUFF TRNQT CYL 24X4X16.5-23 (TOURNIQUET CUFF) IMPLANT
DRSG ADAPTIC 3X8 NADH LF (GAUZE/BANDAGES/DRESSINGS) IMPLANT
ELECT REM PT RETURN 9FT ADLT (ELECTROSURGICAL) ×1
ELECTRODE REM PT RTRN 9FT ADLT (ELECTROSURGICAL) IMPLANT
GAUZE PACKING IODOFORM 1/2INX (GAUZE/BANDAGES/DRESSINGS) IMPLANT
GAUZE PAD ABD 8X10 STRL (GAUZE/BANDAGES/DRESSINGS) ×1 IMPLANT
GAUZE SPONGE 4X4 12PLY STRL (GAUZE/BANDAGES/DRESSINGS) ×1 IMPLANT
GAUZE XEROFORM 1X8 LF (GAUZE/BANDAGES/DRESSINGS) ×1 IMPLANT
GLOVE BIO SURGEON STRL SZ8 (GLOVE) ×1 IMPLANT
GLOVE BIOGEL PI IND STRL 8 (GLOVE) ×1 IMPLANT
GOWN STRL REUS W/ TWL LRG LVL3 (GOWN DISPOSABLE) ×2 IMPLANT
GOWN STRL REUS W/TWL LRG LVL3 (GOWN DISPOSABLE) ×2
KIT BASIN OR (CUSTOM PROCEDURE TRAY) ×1 IMPLANT
KIT STIMULAN RAPID CURE 5CC (Orthopedic Implant) IMPLANT
KIT TURNOVER KIT B (KITS) ×1 IMPLANT
NDL PRECISIONGLIDE 27X1.5 (NEEDLE) ×1 IMPLANT
NEEDLE PRECISIONGLIDE 27X1.5 (NEEDLE) ×1 IMPLANT
NS IRRIG 1000ML POUR BTL (IV SOLUTION) ×1 IMPLANT
PACK ORTHO EXTREMITY (CUSTOM PROCEDURE TRAY) ×1 IMPLANT
PAD ARMBOARD 7.5X6 YLW CONV (MISCELLANEOUS) ×2 IMPLANT
PAD CAST 4YDX4 CTTN HI CHSV (CAST SUPPLIES) ×1 IMPLANT
PADDING CAST COTTON 4X4 STRL (CAST SUPPLIES)
SOL PREP POV-IOD 4OZ 10% (MISCELLANEOUS) ×1 IMPLANT
STAPLER VISISTAT 35W (STAPLE) IMPLANT
SUCTION FRAZIER TIP 8 FR DISP (SUCTIONS) ×1
SUCTION TUBE FRAZIER 8FR DISP (SUCTIONS) IMPLANT
SUT PROLENE 4 0 PS 2 18 (SUTURE) IMPLANT
SUT VIC AB 3-0 PS2 18 (SUTURE) IMPLANT
SUT VICRYL 4-0 PS2 18IN ABS (SUTURE) IMPLANT
SYR CONTROL 10ML LL (SYRINGE) ×1 IMPLANT
TOWEL GREEN STERILE (TOWEL DISPOSABLE) ×1 IMPLANT
TOWEL GREEN STERILE FF (TOWEL DISPOSABLE) ×1 IMPLANT
TUBE CONNECTING 12X1/4 (SUCTIONS) ×1 IMPLANT
YANKAUER SUCT BULB TIP NO VENT (SUCTIONS) IMPLANT

## 2022-05-23 NOTE — Anesthesia Procedure Notes (Signed)
Procedure Name: MAC Date/Time: 05/23/2022 7:49 AM  Performed by: Rande Brunt, CRNAPre-anesthesia Checklist: Patient identified, Emergency Drugs available, Suction available and Patient being monitored Patient Re-evaluated:Patient Re-evaluated prior to induction Oxygen Delivery Method: Simple face mask Preoxygenation: Pre-oxygenation with 100% oxygen Induction Type: IV induction Placement Confirmation: positive ETCO2 and CO2 detector

## 2022-05-23 NOTE — Brief Op Note (Signed)
05/23/2022  8:30 AM  PATIENT:  Wayne [name]  82 y.o. male  PRE-OPERATIVE DIAGNOSIS:  gas gangrene, osteomyelitis  POST-OPERATIVE DIAGNOSIS:  gas gangrene, osteomyelitis  PROCEDURE:  Procedure(s) with comments: AMPUTATION RAY (Right) - Partial 5th ray amputation  SURGEON:  Surgeon(s) and Role:    * Kaytelynn Scripter, Nena Nyeshia Mysliwiec, DPM - Primary  PHYSICIAN ASSISTANT: None  ASSISTANTS: none   ANESTHESIA:   local  EBL:  None - no toruniquet  BLOOD ADMINISTERED:none  DRAINS: none   LOCAL MEDICATIONS USED:  MARCAINE     SPECIMEN:  Source of Specimen:  5th ray distal for pathology, ulceration with underlying tissue culture, deep swab culture 5th MPJ  DISPOSITION OF SPECIMEN:   Path and micro  COUNTS:  YES  TOURNIQUET:  * No tourniquets in log *  DICTATION: .Note written in EPIC  PLAN OF CARE: Admit to inpatient   PATIENT DISPOSITION:  PACU - hemodynamically stable.   Delay start of Pharmacological VTE agent (>24hrs) due to surgical blood loss or risk of bleeding: no

## 2022-05-23 NOTE — Anesthesia Preprocedure Evaluation (Addendum)
Anesthesia Evaluation  Patient identified by MRN, date of birth, ID band Patient awake    Reviewed: Allergy & Precautions, NPO status , Patient's Chart, lab work & pertinent test results  Airway Mallampati: I  TM Distance: >3 FB Neck ROM: Full    Dental  (+) Edentulous Upper, Dental Advisory Given   Pulmonary former smoker    + decreased breath sounds      Cardiovascular hypertension, Pt. on medications and Pt. on home beta blockers + CAD, + Cardiac Stents, + Peripheral Vascular Disease and +CHF   Rhythm:Regular Rate:Normal  Echo:  1. Left ventricular ejection fraction, by estimation, is 50 to 55%. The  left ventricle has low normal function. Left ventricular endocardial  border not optimally defined to evaluate regional wall motion, lateral  regional wall motion abnormality  persists. Left ventricular diastolic parameters are consistent with Grade  II diastolic dysfunction (pseudonormalization).   2. Right ventricular systolic function is normal. The right ventricular  size is normal. Tricuspid regurgitation signal is inadequate for assessing  PA pressure.   3. Left atrial size was mildly dilated.   4. The mitral valve is grossly normal. Trivial mitral valve  regurgitation. No evidence of mitral stenosis.   5. The aortic valve is grossly normal. There is mild calcification of the  aortic valve. Aortic valve regurgitation is not visualized. No aortic  stenosis is present.   6. The inferior vena cava is normal in size with greater than 50%  respiratory variability, suggesting right atrial pressure of 3 mmHg.     Neuro/Psych negative neurological ROS  negative psych ROS   GI/Hepatic negative GI ROS, Neg liver ROS,,,  Endo/Other  diabetes, Type 2, Oral Hypoglycemic Agents    Renal/GU Renal disease     Musculoskeletal negative musculoskeletal ROS (+)    Abdominal   Peds  Hematology   Anesthesia Other Findings    Reproductive/Obstetrics negative OB ROS                             Anesthesia Physical Anesthesia Plan  ASA: 3  Anesthesia Plan: General   Post-op Pain Management: Minimal or no pain anticipated   Induction: Intravenous  PONV Risk Score and Plan: 3 and Ondansetron and Treatment may vary due to age or medical condition  Airway Management Planned: LMA  Additional Equipment: None  Intra-op Plan:   Post-operative Plan: Extubation in OR  Informed Consent: I have reviewed the patients History and Physical, chart, labs and discussed the procedure including the risks, benefits and alternatives for the proposed anesthesia with the patient or authorized representative who has indicated his/her understanding and acceptance.     Dental advisory given  Plan Discussed with: CRNA  Anesthesia Plan Comments:        Anesthesia Quick Evaluation

## 2022-05-23 NOTE — Transfer of Care (Signed)
Immediate Anesthesia Transfer of Care Note  Patient: Wayne [name]  Procedure(s) Performed: AMPUTATION RAY (Right: Fifth Toe)  Patient Location: PACU  Anesthesia Type:MAC  Level of Consciousness: awake, alert , oriented, and drowsy  Airway & Oxygen Therapy: Patient Spontanous Breathing  Post-op Assessment: Report given to RN, Post -op Vital signs reviewed and stable, and Patient moving all extremities X 4  Post vital signs: Reviewed and stable  Last Vitals:  Vitals Value Taken Time  BP    Temp    Pulse    Resp    SpO2      Last Pain:  Vitals:   05/23/22 0723  TempSrc:   PainSc: 0-No pain         Complications: No notable events documented.

## 2022-05-23 NOTE — Progress Notes (Signed)
Orthopedic Tech Progress Note Patient Details:  Wayne [name] 1940/03/31 [id number] Post op shoe was delivered to the patient's room Ortho Devices Type of Ortho Device: Postop shoe/boot Ortho Device/Splint Location: RLE Ortho Device/Splint Interventions: Application      Wayne [name] [name] 05/23/2022, 11:15 AM

## 2022-05-23 NOTE — Progress Notes (Signed)
PROGRESS NOTE  Wayne [name]  DOB: December 31, 1940  PCP: Baruch Gouty, [place] [id number]  DOA: 05/20/2022  LOS: 3 days  Hospital Day: 4  Brief narrative: Wayne [name] is a 82 y.o. male with PMH significant for DM2, CAD, PAD, severe pulmonary hypertension, CHF, subdural hematoma, peripheral neuropathy  Patient has PAD and was seen by vascular surgery in September 2023.  He had angiography performed but did not have any options for reconstruction.  He had a foot ulcer overlying the right fifth MTP joint for close to 2 months, followed by podiatry. 2/6, he was seen by podiatrist Dr. Posey Pronto and to have right submetatarsal 5 ulceration with fat layer exposed.  2/22, patient was brought to the ED for worsening wound, weakness, confusion, fatigue, fever of 101.  In the ED, afebrile, hemodynamically stable.  WBC count elevated 10.6. X-ray right foot are concerning for ulceration over the fifth MTP with underlying osteomyelitis. Blood cultures were collected in the ED Started on IV cefepime and IV vancomycin Admitted to [place] consult obtained. 2/25, underwent partial fifth ray amputation by Dr. Loel Lofty  Subjective: Patient was seen and examined this morning.   Pleasant elderly Caucasian male.  Propped up in bed.  On low-flow oxygen.  Underwent surgery this morning.   His daughter and ex-wife at bedside.   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   Underwent right partial fifth ray amputation this morning.  Per podiatrist, patient had extensive necrosis of the plantar compartment.  Patient will need to return to the OR in 2 to 3 days for repeat washout, partial closure and graft.  There is high risk of nonhealing of the  wound. Currently on IV Rocephin, IV vancomycin and oral Flagyl. WBC count improved.  No fever. Continue to monitor clinically. For pain control, I ordered scheduled Tylenol, as needed oxycodone PRN IV Dilaudid Recent Labs  Lab 05/20/22 2013 05/21/22 0302 05/22/22 1647 05/23/22 0112  WBC 10.6* 8.8 10.8* 10.2  LATICACIDVEN 1.0  --   --   --     AKI on CKD stage IIIa b/l creat 1.3 in 01/2022. Presented with creatinine elevated to 1.69.  Initially improved with IV fluid.  Worsened this morning to 1.61. Blood pressure probably could run low due to the late effect of anesthesia.  Start normal saline at 75 mill per hour. Continue to monitor blood pressure. Recent Labs    11/30/21 0401 12/01/21 0433 12/02/21 0425 12/03/21 0419 12/04/21 0513 02/16/22 1546 05/20/22 2013 05/21/22 0302 05/22/22 1647 05/23/22 0112  BUN 32* 31* 29* 32* 34* 48* 38* 34* 39* 44*  CREATININE 1.21 1.20 1.20 1.13 1.17 1.39* 1.69* 1.49* 1.47* 0000000*   Acute metabolic encephalopathy Acute delirium Last 24 hours, patient had intermittent episodes of confusion and restlessness.  Stable this morning.  Reportedly slept well last night. Continue to maintain circadian rhythm.  Melatonin at bedtime ordered if needed. Haldol low-dose as needed if needed.  EKG from 2/22 with QTc 464 ms.  Type 2 diabetes mellitus A1c 5.9 in 05/20/2022 PTA on Farxiga 10 mg daily, glipizide 5 mg daily Currently on sliding scale insulin with Accu-Cheks.  Blood sugar trend as below. Recent Labs  Lab 05/22/22 1221 05/22/22 1546 05/22/22 2114 05/23/22 0631 05/23/22 0835  GLUCAP 187* 220* 294*  196* 194*    Chronic diastolic CHF  Clinically euvolemic.   Recent echo with EF 50-55% and G2 DD PTA on Toprol 12.5 mg twice daily, Farxiga 10 g daily, Lasix 40 mg daily. Reportedly not taking Entresto. Continue Toprol.  Others on hold.  Peripheral artery disease On chart review it was noted that patient was last seen by vascular surgeon  Dr. Stanford Breed in September 2023.  Patient had an angiogram done in August which showed peroneal only runoff below the knee, with arborization to the posterior tibial artery.  Patient has been optimized from a vascular standpoint. Not sure why he was not on any aspirin or statin as an outpatient.  CAD Patient has not had any chest pain, shortness of breath or any anginal symptoms.  Continue beta-blocker.  Nocturia May have underlying BPH.  Has not been seen by urologist as an outpatient. I do not see any previous abdominal imaging. I suggested RN to obtain postvoid residual.  If elevated, we may be able to start Flomax/finasteride   Mobility: PT eval ordered. Goals of care Patient is DNR at this time.  Daughter wants to switch him to full code for the time of surgery.  If he makes it through the surgery, she intends to change to DNR again.   Code Status: Full Code .    DVT prophylaxis:  heparin injection 5,000 Units Start: 05/21/22 0600   Antimicrobials: IV Rocephin, vancomycin Fluid: NS at 75 mill per hour Consultants: Podiatry Family Communication: Daughter and exwife at bedside.  Status is: Inpatient Level of care: Med-Surg   Dispo: Patient is from: Home              Anticipated d/c is to: Pending clinical course Continue in-hospital care because: May need repeat washout in OR in 2 to 3 days.   Scheduled Meds:  acetaminophen  1,000 mg Oral TID   heparin  5,000 Units Subcutaneous Q8H   insulin aspart  0-5 Units Subcutaneous QHS   insulin aspart  0-6 Units Subcutaneous TID WC   melatonin  3 mg Oral QHS   metoprolol succinate  12.5 mg Oral Daily   metroNIDAZOLE  500 mg Oral Q12H   multivitamin with minerals  1 tablet Oral Daily   nutrition supplement (JUVEN)  1 packet Oral BID BM   Ensure Max Protein  11 oz Oral Daily    PRN meds: haloperidol lactate, HYDROmorphone (DILAUDID) injection, ondansetron **OR** ondansetron (ZOFRAN) IV, oxyCODONE   Infusions:   sodium  chloride     cefTRIAXone (ROCEPHIN)  IV 2 g (05/23/22 0100)   vancomycin 1,250 mg (05/22/22 2126)    Diet:  Diet Order             Diet regular Room service appropriate? No; Fluid consistency: Thin  Diet effective now                   Antimicrobials: Anti-infectives (From admission, onward)    Start     Dose/Rate Route Frequency Ordered Stop   05/23/22 0820  vancomycin (VANCOCIN) powder  Status:  Discontinued          As needed 05/23/22 0820 05/23/22 0830   05/23/22 0820  tobramycin (NEBCIN) injection  Status:  Discontinued          As needed 05/23/22 0820 05/23/22 0830   05/21/22 1000  vancomycin (VANCOCIN) IVPB 1000 mg/200 mL premix  Status:  Discontinued        1,000 mg 200 mL/hr over 60  Minutes Intravenous Every 48 hours 05/21/22 0143 05/21/22 0148   05/21/22 1000  vancomycin (VANCOREADY) IVPB 1250 mg/250 mL        1,250 mg 166.7 mL/hr over 90 Minutes Intravenous Every 36 hours 05/21/22 0148     05/21/22 0200  metroNIDAZOLE (FLAGYL) tablet 500 mg        500 mg Oral Every 12 hours 05/21/22 0048 05/28/22 0159   05/21/22 0100  cefTRIAXone (ROCEPHIN) 2 g in sodium chloride 0.9 % 100 mL IVPB        2 g 200 mL/hr over 30 Minutes Intravenous Every 24 hours 05/21/22 0048 05/28/22 0059   05/20/22 2245  vancomycin (VANCOCIN) IVPB 1000 mg/200 mL premix        1,000 mg 200 mL/hr over 60 Minutes Intravenous  Once 05/20/22 2241 05/21/22 0145   05/20/22 2245  ceFEPIme (MAXIPIME) 2 g in sodium chloride 0.9 % 100 mL IVPB        2 g 200 mL/hr over 30 Minutes Intravenous  Once 05/20/22 2241 05/21/22 0018       Skin assessment:       Nutritional status:  Body mass index is 23.21 kg/m.  Nutrition Problem: Increased nutrient needs Etiology: wound healing Signs/Symptoms: estimated needs     Objective: Vitals:   05/23/22 0853 05/23/22 0904  BP: (!) 144/63 (!) 152/60  Pulse:  64  Resp:  19  Temp: 98.4 F (36.9 C) 97.7 F (36.5 C)  SpO2: 92% 98%    Intake/Output  Summary (Last 24 hours) at 05/23/2022 1055 Last data filed at 05/23/2022 0915 Gross per 24 hour  Intake 1403.6 ml  Output 1600 ml  Net -196.4 ml   Filed Weights   05/22/22 0000  Weight: 79.8 kg   Weight change:  Body mass index is 23.21 kg/m.   Physical Exam: General exam: Pleasant, elderly Caucasian male.  Not in distress Skin: No rashes, lesions or ulcers. HEENT: Atraumatic, normocephalic, no obvious bleeding Lungs: Clear to auscultation bilaterally CVS: Regular rate and rhythm, no murmur GI/Abd soft, nontender, nondistended, bowel sound present CNS: Alert, awake, oriented to place and person Psychiatry: Mood appropriate Extremities: No pedal edema, no calf tenderness.  Right foot ulcer s/p surgery.  Data Review: I have personally reviewed the laboratory data and studies available.  F/u labs ordered Unresulted Labs (From admission, onward)     Start     Ordered   05/24/22 [id number]  Basic metabolic panel  Daily,   R      05/23/22 1055   05/24/22 0500  CBC with Differential/Platelet  Daily,   R      05/23/22 1055   05/23/22 0807  Aerobic/Anaerobic Culture w Gram Stain (surgical/deep wound)  RELEASE UPON ORDERING,   TIMED       Comments: Specimen B: Phone [phone] Immunocompromised?  No  Antibiotic Treatment:   Is the patient on airborne/droplet precautions? No Clinical History:  gas gangrene, osteomyelitis  Special Instructions:  none Specimen Disposition:  Microbiology     05/23/22 0807   05/23/22 0806  Aerobic/Anaerobic Culture w Gram Stain (surgical/deep wound)  RELEASE UPON ORDERING,   TIMED       Comments: Specimen A: Phone [phone] Immunocompromised?  No  Antibiotic Treatment:   Is the patient on airborne/droplet precautions? No Clinical History:  gas gangrene, osteomyelitis  Special Instructions:  none Specimen Disposition:  Microbiology    05/23/22 0806            Total time spent in review of  labs and imaging, patient evaluation, formulation of  plan, documentation and communication with family: 38 minutes  Signed, Terrilee Croak, MD [facility] 05/23/2022

## 2022-05-23 NOTE — Anesthesia Postprocedure Evaluation (Signed)
Anesthesia Post Note  Patient: Wayne [name]  Procedure(s) Performed: AMPUTATION RAY (Right: Fifth Toe)     Patient location during evaluation: PACU Anesthesia Type: MAC Level of consciousness: awake and alert Pain management: pain level controlled Vital Signs Assessment: post-procedure vital signs reviewed and stable Respiratory status: spontaneous breathing, nonlabored ventilation, respiratory function stable and patient connected to nasal cannula oxygen Cardiovascular status: blood pressure returned to baseline and stable Postop Assessment: no apparent nausea or vomiting Anesthetic complications: no  No notable events documented.  Last Vitals:  Vitals:   05/23/22 0853 05/23/22 0904  BP: (!) 144/63 (!) 152/60  Pulse:  64  Resp:  19  Temp: 36.9 C 36.5 C  SpO2: 92% 98%    Last Pain:  Vitals:   05/23/22 0904  TempSrc: Oral  PainSc: 0-No pain                 Effie Berkshire

## 2022-05-23 NOTE — Op Note (Addendum)
Full Operative Report  Date of Operation: 0800, 05/23/2022   Patient: Wayne [name] - 82 y.o. male  Surgeon: Yevonne Pax, DPM   Assistant: None  Diagnosis: gas gangrene, osteomyelitis 5th metatarsal phalangeal joint right foot  Procedure:  1. Partial 5th ray amputation, right foot 2. Implantation antibiotic drug delivery device, right foot    Anesthesia: Monitor Anesthesia Care  No responsible provider has been recorded for the case.  Anesthesiologist: Effie Berkshire, MD CRNA: Rande Brunt, CRNA   Estimated Blood Loss: Minimal   Hemostasis: 1) Anatomical dissection, mechanical compression, electrocautery 2) No tourniquet was used  Implants: Implant Name Type Inv. Item Serial No. Manufacturer Lot No. LRB No. Used Action  KIT STIMULAN RAPID CURE 5CC - [id number] Orthopedic Implant KIT STIMULAN RAPID CURE 5CC  [employer] [id number] Right 1 Implanted    Materials: 1/2 in iodoform  Injectables: 1) Pre-operatively: 10 cc of 0.5% marcaine plain 2) Post-operatively: None  Specimens: 5th toe and distal metatarsal for pathlogy, plantar ulceration tissue for culture, 5th MPJ deep swab culture   Antibiotics: Abx given as scheduled from the floor  Drains: None  Complications: Patient tolerated the procedure well without complication.   Findings: as below  Indications for Procedure: Wayne [name] presents to [facility], [place] with a chief complaint of redness swelling to the right forefoot at site of plantar 5th mpj necrotic ulceration. On XR concern for gas in soft tissues and underlying osteomyelitis of the 5th proximal phalanx and 5th met head. Has undergone prior RLE revascularization and per vascular notes from prior no further revascularization options exist for the pt. The patient has failed conservative treatments of various modalities. At this time the patient has elected to proceed with surgical correction. All alternatives, risks,  and complications of the procedures were thoroughly explained to the patient. Patient exhibits appropriate understanding of all discussion points and informed consent was signed and obtained in the chart with no guarantees to surgical outcome given or implied.  Description of Procedure: Patient was brought to the operating room and remained on hospital bed.  A surgical timeout was performed and all members of the operating room, the procedure, and the surgical site were identified. MAC with local anesthesia occurred. Local anesthetic as previously described was then injected about the operative field in a local infiltrative block.   The right lower extremity was then prepped and draped in the usual sterile manner.  The following procedure then began.  Attention was directed to the right lower extremity.  First the plantar ulceration at the 5th MPJ was excised. It measured 3x2 cm. Upon excisoin the wound was noted to be full thickness to 5th met head with purulence and necrotic tissue undelrying. Evidence of necrosis of the base of the 5th toe. A racquet type incision was made over the dorsal medial aspect of the 5th metatarsal, including the entire digit. A full thickness incision was made down to bone using a #15 blade. The incision was continued through the soft tissue down to the shaft of the 5th metatarsal. At this time, 5 cc of purulent drainage was noted near the metatarsal head. A deep wound culture was taken at this time and passed off the field. Using a #15 blade, the digit was then disarticulated in its entirety at the metatarsophalangeal joint and freed of all soft tissue attachments. The specimen was passed off the field and sent for gross pathology. Next, a key elevator was then used to free up the periosteum  on the metatarsal shaft. Using a sagittal saw, the metatarsal was cut in a dorsal distal to plantar proximal orientation. The bone was passed off the field and sent as a gross specimen.  All  remaining non-viable and necrotic tissues were sharply resected and removed. There was necrosis and infection tracking extensively in the plantar musculature and along the FDL tendon. Extensor and flexors tendons were grasped with a hemostat and cut proximally. The surgical site was then flushed with 3017m of saline with pulse lavage.  Next, Stimulan small beads with vancomycin and tobramycin were implanted in the surgical site for local antimicrobial properties. There was very little no no bleeding at the surgical site during the procedure. The skin site was then packed open with iodoform gauze soaked in betadine.   The surgical site was then dressed with betadine adaptic 4x4 kerlix and ace.  The patient tolerated both the procedure and anesthesia well with vital signs stable throughout. The patient was transferred from the OR to recovery under the discretion of anesthesia.  Condition: Patient was taken to PACU in good condition and all vital signs stable and neurovascular status intact to the operative limb.  Discharge: Pt will be readmitted to the floor for ongoing wound care IV abx. He will need return to the OR in 2-3 days for repeat washout and possible closure of the wound. Remains high risk for nonhealing due to severe PAD single vessel peroneal runoff to right foot and limited bleeding seen intraoperatively.   The patient will follow the protocol of rest, elevation. The patient will be weightbearing in a post op shoe to the operative limb until further instructed. The dressing is to remain clean, dry, and intact.   AAmes Coupe DPM

## 2022-05-23 NOTE — Progress Notes (Signed)
PODIATRY PROGRESS NOTE Patient Name: Wayne [name]  DOB 1940-05-15 DOA 05/20/2022  Hospital Day: 4  Assessment:  82 y.o. 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   AF, VSS WBC 8.4 ESR 72, CRP 18.2 XR foot R: 1. Soft tissue ulceration overlying the fifth metatarsophalangeal joint, with underlying osteopenia and bony erosions consistent with osteomyelitis.  Wound/Bone Cultures: pending OR   Plan:  - NPO for OR for Right 5th MPJ resection, possible partial 5th ray resection.  - Abx broad spectrum, pending OR cultures to narrow - WBAT to RLE in post op shoe post op - Will plan to change dressing tomorrow Will continue to follow        Everitt Amber, DPM [facility]    Subjective:  Pt seen in pre op. Aware of plans for OR. Discussed on phone with pts daughter Wayne [name] who states to try to salvage toe but if too infected to proceed with amputation based on clinical judgement. Aware likely staged procedure and possible return to OR for further washout. All questions answered.   Objective:   Vitals:   05/22/22 2112 05/23/22 0538  BP: (!) 144/66 (!) 144/61  Pulse: 73 68  Resp: 15 15  Temp: 98.8 F (37.1 C) 98.9 F (37.2 C)  SpO2: 94% 94%       Latest Ref Rng & Units 05/23/2022    1:12 AM 05/22/2022    4:47 PM 05/21/2022    3:02 AM  CBC  WBC 4.0 - 10.5 K/uL 10.2  10.8  8.8   Hemoglobin 13.0 - 17.0 g/dL 12.6  12.6  12.2   Hematocrit 39.0 - 52.0 % 37.1  36.7  37.6   Platelets 150 - 400 K/uL 208  194  174        Latest Ref Rng & Units 05/23/2022    1:12 AM 05/22/2022    4:47 PM 05/21/2022    3:02 AM  BMP  Glucose 70 - 99 mg/dL 242  240  121   BUN 8 - 23 mg/dL 44  39  34   Creatinine 0.61 - 1.24 mg/dL 1.61  1.47  1.49   Sodium 135 - 145 mmol/L 136  135  135   Potassium 3.5 - 5.1 mmol/L 4.0  4.1   3.9   Chloride 98 - 111 mmol/L 105  103  102   CO2 22 - 32 mmol/L 20  21  23   $ Calcium 8.9 - 10.3 mg/dL 8.4  8.4  8.0     General: AAOx3, NAD  Lower Extremity Exam Lower Extremity Exam Vasc:     R - PT non palpable, DP non palpable. Cap refill < 3 sec to digits                  Derm:    R - Necrotic unstageable ulceraiton with eschar overlying at the plantar aspect of the right foot with erythema and edea of the 5th MPJ and 5th toe. Purplish discoloration dorsally. Worse than prior pictures clinically. No necrosis or open wound of 2nd toe but there is healed soft tissue defect.     MSK:     R - Edema of the right lateral forefoot     Neuro:   R - Gross sensation absent. Gross motor function intact  Radiology:  Results reviewed. See assessment for pertinent imaging results

## 2022-05-24 ENCOUNTER — Encounter (HOSPITAL_COMMUNITY): Payer: Self-pay | Admitting: Podiatry

## 2022-05-24 DIAGNOSIS — M86171 Other acute osteomyelitis, right ankle and foot: Secondary | ICD-10-CM | POA: Diagnosis not present

## 2022-05-24 LAB — GLUCOSE, CAPILLARY
Glucose-Capillary: 172 mg/dL — ABNORMAL HIGH (ref 70–99)
Glucose-Capillary: 258 mg/dL — ABNORMAL HIGH (ref 70–99)
Glucose-Capillary: 175 mg/dL — ABNORMAL HIGH (ref 70–99)
Glucose-Capillary: 165 mg/dL — ABNORMAL HIGH (ref 70–99)

## 2022-05-24 LAB — CBC WITH DIFFERENTIAL/PLATELET
Abs Immature Granulocytes: 0.07 10*3/uL (ref 0.00–0.07)
Basophils Absolute: 0 10*3/uL (ref 0.0–0.1)
Basophils Relative: 1 %
Eosinophils Absolute: 0.4 10*3/uL (ref 0.0–0.5)
Eosinophils Relative: 5 %
HCT: 36.9 % — ABNORMAL LOW (ref 39.0–52.0)
Hemoglobin: 12.2 g/dL — ABNORMAL LOW (ref 13.0–17.0)
Immature Granulocytes: 1 %
Lymphocytes Relative: 15 %
Lymphs Abs: 1.2 10*3/uL (ref 0.7–4.0)
MCH: 29.1 pg (ref 26.0–34.0)
MCHC: 33.1 g/dL (ref 30.0–36.0)
MCV: 88.1 fL (ref 80.0–100.0)
Monocytes Absolute: 0.8 10*3/uL (ref 0.1–1.0)
Monocytes Relative: 10 %
Neutro Abs: 5.4 10*3/uL (ref 1.7–7.7)
Neutrophils Relative %: 68 %
Platelets: 199 10*3/uL (ref 150–400)
RBC: 4.19 MIL/uL — ABNORMAL LOW (ref 4.22–5.81)
RDW: 13 % (ref 11.5–15.5)
WBC: 7.8 10*3/uL (ref 4.0–10.5)
nRBC: 0 % (ref 0.0–0.2)

## 2022-05-24 LAB — BASIC METABOLIC PANEL
Anion gap: 11 (ref 5–15)
BUN: 41 mg/dL — ABNORMAL HIGH (ref 8–23)
CO2: 21 mmol/L — ABNORMAL LOW (ref 22–32)
Calcium: 8.5 mg/dL — ABNORMAL LOW (ref 8.9–10.3)
Chloride: 106 mmol/L (ref 98–111)
Creatinine, Ser: 1.39 mg/dL — ABNORMAL HIGH (ref 0.61–1.24)
GFR, Estimated: 50 mL/min — ABNORMAL LOW (ref >60)
Glucose, Bld: 188 mg/dL — ABNORMAL HIGH (ref 70–99)
Potassium: 4 mmol/L (ref 3.5–5.1)
Sodium: 138 mmol/L (ref 135–145)

## 2022-05-24 MED ORDER — POLYETHYLENE GLYCOL 3350 17 G PO PACK
17.0000 g | PACK | Freq: Every day | ORAL | Status: DC | PRN
  Administered 2022-05-24: 17 g via ORAL
  Filled 2022-05-24: qty 1

## 2022-05-24 MED ORDER — VANCOMYCIN HCL 1250 MG/250ML IV SOLN
1250.0000 mg | INTRAVENOUS | Status: DC
  Administered 2022-05-25: 1250 mg via INTRAVENOUS
  Filled 2022-05-24: qty 250

## 2022-05-24 MED ORDER — SODIUM CHLORIDE 0.9 % IV SOLN
2.0000 g | Freq: Two times a day (BID) | INTRAVENOUS | Status: DC
  Administered 2022-05-24 – 2022-05-25 (×2): 2 g via INTRAVENOUS
  Filled 2022-05-24 (×2): qty 12.5

## 2022-05-24 NOTE — TOC Initial Note (Signed)
Transition of Care [facility]) - Initial/Assessment Note    Patient Details  Name: Wayne [name] MRN: [id number] Date of Birth: 12-13-1940  [facility] CM/SW Contact:    Cyndi Bender, RN Phone Number: 05/24/2022, 1:10 PM  Clinical Narrative:                 Spoke to patient and friend at bedside. Patient lives home alone and has walker. Patient was active with [facility].  Cory with [facility] can accept.  Patient plans to call private duty aides to provide care when family and friend can not.  Plan for surgery tomorrow.  TOC will continue to follow for needs.    Expected Discharge Plan: [place] Barriers to Discharge: Continued Medical Work up   Patient Goals and CMS Choice Patient states their goals for this hospitalization and ongoing recovery are:: return home CMS Medicare.gov Compare Post Acute Care list provided to:: Patient Choice offered to / list presented to : Patient      Expected Discharge Plan and Services   Discharge Planning Services: CM Consult Post Acute Care Choice: [place] arrangements for the past 2 months: [place]: PT [place]: [place] Date [place]: 05/24/22 Time HH Agency Contacted: 38 Representative spoke with at [place]: Tommi Rumps  Prior Living Arrangements/Services Living arrangements for the past 2 months: [place] with:: Self Patient language and need for interpreter reviewed:: Yes Do you feel safe going back to the place where you live?: Yes      Need for Family Participation in Patient Care: Yes (Comment) Care giver support system in place?: Yes (comment) Current home services: DME, Home RN, Home PT (walker) Criminal Activity/Legal Involvement Pertinent to Current Situation/Hospitalization: No - Comment as needed  Activities of Daily Living Home Assistive Devices/Equipment: None ADL Screening  (condition at time of admission) Patient's cognitive ability adequate to safely complete daily activities?: No Is the patient deaf or have difficulty hearing?: Yes Does the patient have difficulty seeing, even when wearing glasses/contacts?: No Does the patient have difficulty concentrating, remembering, or making decisions?: Yes Patient able to express need for assistance with ADLs?: Yes Does the patient have difficulty dressing or bathing?: Yes Independently performs ADLs?: No Communication: Needs assistance Is this a change from baseline?: Pre-admission baseline Dressing (OT): Needs assistance Is this a change from baseline?: Pre-admission baseline Grooming: Needs assistance Is this a change from baseline?: Pre-admission baseline Feeding: Independent Bathing: Needs assistance Is this a change from baseline?: Pre-admission baseline Toileting: Independent In/Out Bed: Independent Walks in Home: Needs assistance Is this a change from baseline?: Change from baseline, expected to last >3 days Does the patient have difficulty walking or climbing stairs?: Yes Weakness of Legs: Both Weakness of Arms/Hands: None  Permission Sought/Granted Permission sought to share information with : Investment banker, corporate granted to share info w AGENCY: HH        Emotional Assessment Appearance:: Appears stated age Attitude/Demeanor/Rapport: Gracious Affect (typically observed): Accepting Orientation: : Oriented to Self, Oriented to Place, Oriented to  Time, Oriented to Situation Alcohol / Substance Use: Not Applicable Psych Involvement: No (comment)  Admission diagnosis:  Acute osteomyelitis of left foot ([place]) [M86.172] Acute osteomyelitis of right foot ([place]) [M86.171] Other acute  osteomyelitis of right foot ([place]) [M86.171] Patient Active Problem List   Diagnosis Date Noted   Acute renal failure superimposed on stage 3a chronic kidney disease ([place]) 05/21/2022    Pyogenic inflammation of bone ([place]) 05/21/2022   Acute osteomyelitis of right foot ([place]) 05/20/2022   Hypertension associated with diabetes ([place]) 02/16/2022   Hyperlipidemia associated with type 2 diabetes mellitus ([place]) 02/16/2022   Vitamin D deficiency 02/16/2022   Primary insomnia 02/16/2022   Acute on chronic combined systolic and diastolic CHF (congestive heart failure) ([place]) 12/04/2021   Closed compression fracture of body of L1 vertebra (H[facility]) 12/03/2021   Chronic diastolic CHF (congestive heart failure) ([place]) 12/02/2021   Chronic kidney disease, stage 3a ([place]) 12/02/2021   Ischemia of toe 12/01/2021   Constipation 11/30/2021   Multifocal pneumonia 11/28/2021   Dyslipidemia 11/28/2021   Fall at home 11/28/2021   Subdural hematoma ([place]) 11/28/2021   Failure to thrive in adult 11/28/2021   Lactic acidosis 11/28/2021   Vitamin B12 deficiency 11/28/2021   B12 deficiency anemia 11/28/2021   PAD (peripheral artery disease) ([place]) 11/28/2021   Diabetic neuropathy ([place]) 11/28/2021   Cellulitis of right foot 11/11/2021   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 [place] [address]Phone: [phone]: [phone]    Social Determinants of Health (SDOH) Social History: SDOH Screenings   Food Insecurity: No Food Insecurity (05/22/2022)  Housing: Low Risk  (05/22/2022)  Transportation Needs: No Transportation Needs (05/22/2022)  Utilities: Not At Risk (05/22/2022)  Depression (PHQ2-9): Low Risk  (05/05/2022)  Financial Resource Strain: Low Risk  (05/05/2022)  Physical Activity: Inactive (05/05/2022)  Social Connections: Socially Isolated (05/05/2022)  Stress: No Stress Concern Present (05/05/2022)  Tobacco Use: Medium  Risk (05/24/2022)   SDOH Interventions:     Readmission Risk Interventions    11/12/2021    2:59 PM  Readmission Risk Prevention Plan  Post Dischage Appt Complete  Medication Screening Complete  Transportation Screening Complete

## 2022-05-24 NOTE — Inpatient Diabetes Management (Signed)
Inpatient Diabetes Program Recommendations  AACE/ADA: New Consensus Statement on Inpatient Glycemic Control (2015)  Target Ranges:  Prepandial:   less than 140 mg/dL      Peak postprandial:   less than 180 mg/dL (1-2 hours)      Critically ill patients:  140 - 180 mg/dL   Lab Results  Component Value Date   GLUCAP 234 (H) 05/23/2022   HGBA1C 5.9 (H) 05/20/2022    Latest Reference Range & Units 05/22/22 15:46 05/22/22 21:14 05/23/22 06:31 05/23/22 08:35 05/23/22 12:52 05/23/22 16:40 05/23/22 21:00  Glucose-Capillary 70 - 99 mg/dL 220 (H) 294 (H) 196 (H) 194 (H) 230 (H) 221 (H) 234 (H)  (H): Data is abnormally high   Diabetes history: DM2 Outpatient Diabetes medications: Glipizide 5 mg QAM Current orders for Inpatient glycemic control: Novolog 0-6 units TID with meals, Novolog 0-5 units QHS   Inpatient Diabetes Program Recommendations:   Noted CBGs>180 since supplements added. Please consider if appropriate: -Add carb modified to diet  Thank you, Nani Gasser. Khamari Sheehan, RN, MSN, CDE  Diabetes Coordinator Inpatient Glycemic Control Team Team Pager [phone] (8am-5pm) 05/24/2022 7:23 AM

## 2022-05-24 NOTE — Progress Notes (Signed)
Pharmacy Antibiotic Note  Wayne [name] is a 82 y.o. male admitted on 05/20/2022 with RLE osteomyelitis.  Pharmacy has been consulted for Vancomycin dosing.  Culture growing E.faecalis and Pseudomonas, not yet finalized.  Renal function improving, afebrile, WBC WNL.  Plan: Change vanc to '1250mg'$  IV Q24H for AUC 482 using SCr 1.39 Cefepime 2gm IV Q12H Flagyl '500mg'$  PO BID Monitor renal fxn, micro data to narrow, vanc levels as indicated   Height: '6\' 1"'$  (185.4 cm) Weight: 85.4 kg (188 lb 4.4 oz) IBW/kg (Calculated) : 79.9  Temp (24hrs), Avg:97.8 F (36.6 C), Min:97.6 F (36.4 C), Max:98.2 F (36.8 C)  Recent Labs  Lab 05/20/22 2013 05/21/22 0302 05/22/22 0149 05/22/22 1647 05/23/22 0112 05/24/22 0256  WBC 10.6* 8.8 12.0* 10.8* 10.2 7.8  CREATININE 1.69* 1.49* 1.68* 1.47* 1.61* 1.39*  LATICACIDVEN 1.0  --   --   --   --   --      Estimated Creatinine Clearance: 45.5 mL/min (A) (by C-G formula based on SCr of 1.39 mg/dL (H)).    No Known Allergies  Vanc 2/23> Flagyl 2/23> CTX 2/23 >> 2/26 Cefepime 2/26 >>  2/25 BCx - NGTD 2/25 5th ray - E.faecalis and Pseudomonas, preliminary   Gae Bihl D. Mina Marble, PharmD, BCPS, [place] 05/24/2022, 12:39 PM

## 2022-05-24 NOTE — Evaluation (Signed)
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 Pt is WBAT in the post-op shoe. PMH signficant for chronic HF, CKD IIIa, CAD, DM, encephalopathy, PAD, peripheral neuropathy, SDH, severe pulmonary HTN.   Clinical Impression  Pt admitted with above diagnosis. Pt currently with functional limitations due to the deficits listed below (see PT Problem List). At the time of PT eval pt was able to perform transfers and ambulation with gross min guard assist to min assist and RW for support. Pt reports he has a girlfriend who assists him intermittently at home, but that he has been pretty independent with the RW. Recommend increased supervision first few days home but anticipate pt will progress well with HHPT follow up. Acutely, pt will benefit from skilled PT to increase their independence and safety with mobility to allow discharge to the venue listed below.          Recommendations for follow up therapy are one component of a multi-disciplinary discharge planning process, led by the attending physician.  Recommendations may be updated based on patient status, additional functional criteria and insurance authorization.  Follow Up Recommendations Home health PT      Assistance Recommended at Discharge Intermittent Supervision/Assistance  Patient can return home with the following  A little help with walking and/or transfers;A little help with bathing/dressing/bathroom;Assistance with cooking/housework;Assist for transportation;Help with stairs or ramp for entrance    Equipment Recommendations None recommended by PT  Recommendations for Other Services       Functional Status Assessment Patient has had a recent decline in their functional status and demonstrates the ability to make significant  improvements in function in a reasonable and predictable amount of time.     Precautions / Restrictions Precautions Precautions: Fall Required Braces or Orthoses: Other Brace Other Brace: Post-op shoe Restrictions Weight Bearing Restrictions: No      Mobility  Bed Mobility Overal bed mobility: Needs Assistance Bed Mobility: Supine to Sit     Supine to sit: Min guard     General bed mobility comments: Increased time, with heavy use of rails. Pt able to complete without therapist assist.    Transfers Overall transfer level: Needs assistance Equipment used: Rolling walker (2 wheels) Transfers: Sit to/from Stand Sit to Stand: Min assist, Min guard           General transfer comment: Min assist initially progressing to min guard assist. VC's for hand placement on seated surface for safety.    Ambulation/Gait Ambulation/Gait assistance: Min guard Gait Distance (Feet): 200 Feet Assistive device: Rolling walker (2 wheels) Gait Pattern/deviations: Step-through pattern, Decreased stride length, Trunk flexed, Narrow base of support (Decreased floor clearance) Gait velocity: Decreased Gait velocity interpretation: <1.31 ft/sec, indicative of household ambulator   General Gait Details: VC's for improved posture, closer walker proximity, and forward gaze. No assist required but hands on guarding provided throughout for safety. Pt outside walker during turns and requires multimodal cues to correct. 1 standing rest break due to fatigue.  Stairs            Wheelchair Mobility    Modified Rankin (Stroke Patients Only)       Balance Overall balance assessment: Needs assistance Sitting-balance support: Feet supported, No upper extremity supported Sitting balance-Leahy Scale: Fair     Standing balance support: No upper extremity supported,  During functional activity, Reliant on assistive device for balance Standing balance-Leahy Scale: Poor Standing balance comment:  Fair statically but requires UE support for any dynamic standing activity.                             Pertinent Vitals/Pain Pain Assessment Pain Assessment: No/denies pain Faces Pain Scale: No hurt    Home Living Family/patient expects to be discharged to:: Private residence Living Arrangements: Alone Available Help at Discharge: Friend(s);Available PRN/intermittently Type of Home: House Home Access: Stairs to enter;Elevator   Entrance Stairs-Number of Steps: 10, but has installed an elevator   Home Layout: One level Home Equipment: Conservation officer, nature (2 wheels);Cane - single point;Shower seat Additional Comments: Pt only mentions a girlfriend, but last admission notes a daughter who was available to assist. Unsure if daughter is still available to assist at d/c.    Prior Function Prior Level of Function : Independent/Modified Independent             Mobility Comments: Using RW at all times. Pt reports he sleeps on the couch or in the recliner, and does not sleep in the bed at all. Unsure if this is baseline or because he cannot navigate stairs in his home. He states everything is on 1 level but past admission states 2 level. ADLs Comments: Pt reports girlfriend takes pt to the grocery store and is intermittently in and out at home.     Hand Dominance   Dominant Hand: Right    Extremity/Trunk Assessment   Upper Extremity Assessment Upper Extremity Assessment: Generalized weakness    Lower Extremity Assessment Lower Extremity Assessment: Generalized weakness;RLE deficits/detail RLE Deficits / Details: Decreased strength and AROM consistent with above mentioned surgery.    Cervical / Trunk Assessment Cervical / Trunk Assessment: Kyphotic;Other exceptions Cervical / Trunk Exceptions: Forward head posture with rounded shoulders  Communication   Communication: No difficulties  Cognition Arousal/Alertness: Awake/alert Behavior During Therapy: WFL for tasks  assessed/performed Overall Cognitive Status: Impaired/Different from baseline Area of Impairment: Orientation, Safety/judgement, Awareness, Problem solving                 Orientation Level: Disoriented to, Time       Safety/Judgement: Decreased awareness of safety, Decreased awareness of deficits Awareness: Emergent Problem Solving: Slow processing, Requires verbal cues          General Comments      Exercises     Assessment/Plan    PT Assessment Patient needs continued PT services  PT Problem List Decreased strength;Decreased range of motion;Decreased activity tolerance;Decreased balance;Decreased mobility;Decreased knowledge of use of DME;Decreased safety awareness;Decreased knowledge of precautions;Pain       PT Treatment Interventions DME instruction;Gait training;Functional mobility training;Therapeutic activities;Therapeutic exercise;Balance training;Patient/family education;Cognitive remediation    PT Goals (Current goals can be found in the Care Plan section)  Acute Rehab PT Goals Patient Stated Goal: Be able to take care of his dog at home PT Goal Formulation: With patient Time For Goal Achievement: 05/31/22 Potential to Achieve Goals: Good    Frequency Min 5X/week     Co-evaluation               AM-PAC PT "6 Clicks" Mobility  Outcome Measure Help needed turning from your back to your side while in a flat bed without using bedrails?: A Little Help needed moving from lying on your back to sitting on the side of a flat bed without using bedrails?: A  Little Help needed moving to and from a bed to a chair (including a wheelchair)?: A Little Help needed standing up from a chair using your arms (e.g., wheelchair or bedside chair)?: A Little Help needed to walk in hospital room?: A Little Help needed climbing 3-5 steps with a railing? : A Little 6 Click Score: 18    End of Session Equipment Utilized During Treatment: Gait belt;Other (comment)  (Post-op shoe) Activity Tolerance: Patient tolerated treatment well Patient left: in chair;with call bell/phone within reach;with family/visitor present (No chair alarm box available. RN reports ok to leave pt up in chair.) Nurse Communication: Mobility status PT Visit Diagnosis: Unsteadiness on feet (R26.81);Muscle weakness (generalized) (M62.81);Difficulty in walking, not elsewhere classified (R26.2)    Time: [id number] PT Time Calculation (min) (ACUTE ONLY): 36 min   Charges:   PT Evaluation $PT Eval Moderate Complexity: 1 Mod PT Treatments $Gait Training: 8-22 mins        Rolinda Roan, PT, DPT Acute Rehabilitation Services Secure Chat Preferred Office: [phone]   Thelma Comp 05/24/2022, 10:26 AM

## 2022-05-24 NOTE — Progress Notes (Signed)
Subjective:  Patient ID: Wayne [name], male    DOB: 13-Jan-1941,  MRN: [id number]  Patient seen at bedside this afternoon s/p right partial fifth ray amputation delayed primary closure. Patient relates doing well without pain. No compalints. Relates he did do some walking in the boot earlier today.   Past Medical History:  Diagnosis Date   Anemia of chronic disease    Chronic HFrEF (heart failure with reduced ejection fraction) (HCC)    Chronic kidney disease, stage 3a (HCC)    Coronary artery disease    Diabetes mellitus with circulatory complication (HCC)    Diabetic foot infection ([place])    Encephalopathy    Former tobacco use    PAD (peripheral artery disease) (HCC)    Peripheral neuropathy    SDH (subdural hematoma) (HCC)    Severe pulmonary hypertension ([place])      Past Surgical History:  Procedure Laterality Date   ABDOMINAL AORTOGRAM W/LOWER EXTREMITY Right 11/13/2021   Procedure: ABDOMINAL AORTOGRAM W/LOWER EXTREMITY;  Surgeon: Cherre Robins, MD;  Location: [facility];  Service: Cardiovascular;  Laterality: Right;   AMPUTATION Right 05/23/2022   Procedure: AMPUTATION RAY;  Surgeon: Yevonne Pax, DPM;  Location: [facility];  Service: Podiatry;  Laterality: Right;  Partial 5th ray amputation   CORONARY ANGIOPLASTY WITH STENT PLACEMENT         Latest Ref Rng & Units 05/24/2022    2:56 AM 05/23/2022    1:12 AM 05/22/2022    4:47 PM  CBC  WBC 4.0 - 10.5 K/uL 7.8  10.2  10.8   Hemoglobin 13.0 - 17.0 g/dL 12.2  12.6  12.6   Hematocrit 39.0 - 52.0 % 36.9  37.1  36.7   Platelets 150 - 400 K/uL 199  208  194        Latest Ref Rng & Units 05/24/2022    2:56 AM 05/23/2022    1:12 AM 05/22/2022    4:47 PM  BMP  Glucose 70 - 99 mg/dL 188  242  240   BUN 8 - 23 mg/dL 41  44  39   Creatinine 0.61 - 1.24 mg/dL 1.39  1.61  1.47   Sodium 135 - 145 mmol/L 138  136  135   Potassium 3.5 - 5.1 mmol/L 4.0  4.0  4.1   Chloride 98 - 111 mmol/L 106  105  103   CO2 22  - 32 mmol/L '21  20  21   '$ Calcium 8.9 - 10.3 mg/dL 8.5  8.4  8.4      Objective:   Vitals:   05/24/22 0256 05/24/22 0838  BP: (!) 152/66 (!) 142/64  Pulse: (!) 57 64  Resp: 18 20  Temp: 97.9 F (36.6 C) 97.6 F (36.4 C)  SpO2: 94% 92%    General:AA&O x 3. Normal mood and affect   Vascular: DP and PT pulses 2/4 bilateral. Brisk capillary refill to all digits. Pedal hair present   Neruological. Epicritic sensation grossly intact.   Derm: Lateral right foot wound with some granular tissue noted. Minimal erythema. No purulence. Minimal bleeding noted  MSK: MMT 5/5 in dorsiflexion, plantar flexion, inversion and eversion. Normal joint ROM without pain or crepitus.       Assessment & Plan:  Patient was evaluated and treated and all questions answered.  DX: s/p right partial fifth ray amputation.  Dressing changed and repacked today with WTD saline dressing.  Discussed with patient concern for healing of this wound and need  for return to OR for repeat washout and delayed primary closure. Discussed he remains high risk for non healing and limb loss due to vascular status and limited bleeding intraoperatively.  Will plan for return to [place] after midnight.   Lorenda Peck, DPM  Accessible via secure chat for questions or concerns.

## 2022-05-24 NOTE — Progress Notes (Signed)
PROGRESS NOTE  Wayne [name]  DOB: [birth year]  PCP: Baruch Gouty, [place] [id number]  DOA: 05/20/2022  LOS: 4 days  Hospital Day: 5  Brief narrative: Wayne [name] is a 82 y.o. male with PMH significant for DM2, CAD, PAD, severe pulmonary hypertension, CHF, subdural hematoma, peripheral neuropathy  Patient has PAD and was seen by vascular surgery in September 2023.  He had angiography performed but did not have any options for reconstruction.  He had a foot ulcer overlying the right fifth MTP joint for close to 2 months, followed by podiatry. 2/6, he was seen by podiatrist Dr. Posey Pronto and to have right submetatarsal 5 ulceration with fat layer exposed.  2/22, patient was brought to the ED for worsening wound, weakness, confusion, fatigue, fever of 101.  In the ED, afebrile, hemodynamically stable.  WBC count elevated 10.6. X-ray right foot are concerning for ulceration over the fifth MTP with underlying osteomyelitis. Blood cultures were collected in the ED Started on IV cefepime and IV vancomycin Admitted to [facility] consult obtained. 2/25, underwent partial fifth ray amputation by Dr. Loel Lofty  Subjective: Patient was seen and examined this morning.   Pleasant elderly Caucasian male.  Sitting up in recliner.  Not on oxygen.  Cheerful.  Slept well last night.  Family not at bedside this morning.  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   Underwent right partial fifth ray amputation this morning.  Per podiatrist, patient had extensive necrosis of the plantar compartment.  Patient will need to return to the OR in 2 to 3 days for repeat washout, partial closure and graft.  There is high risk of nonhealing of the wound. Currently on IV Rocephin, IV vancomycin and oral Flagyl. WBC count improved.  No fever. Continue to monitor  clinically. For pain control, he is currently on scheduled Tylenol, PRN oxycodone, PRN IV Dilaudid Recent Labs  Lab 05/20/22 2013 05/21/22 0302 05/22/22 0149 05/22/22 1647 05/23/22 0112 05/24/22 0256  WBC 10.6* 8.8 12.0* 10.8* 10.2 7.8  LATICACIDVEN 1.0  --   --   --   --   --      AKI on CKD stage IIIa b/l creat 1.3 in 01/2022. Presented with creatinine elevated to 1.69.  Gradually improving.  1.39 today.  Continue IV fluid at low rate for next 24 hours. Recent Labs    12/02/21 0425 12/03/21 0419 12/04/21 0513 02/16/22 1546 05/20/22 2013 05/21/22 0302 05/22/22 0149 05/22/22 1647 05/23/22 0112 05/24/22 0256  BUN 29* 32* 34* 48* 38* 34* 32* 39* 44* 41*  CREATININE 1.20 1.13 1.17 1.39* 1.69* 1.49* 1.68* 1.47* 1.61* 1.39*    Acute metabolic encephalopathy Acute delirium patient had intermittent episodes of confusion and restlessness.  Better this morning.  Slept well last night. Continue to maintain circadian rhythm.  Continue melatonin at bedtime ordered if needed. Haldol low-dose as needed if needed.  EKG from 2/22 with QTc 464 ms.  Type 2 diabetes mellitus A1c 5.9 in 05/20/2022 PTA on Farxiga 10 mg daily, glipizide 5 mg daily Currently on sliding scale insulin with Accu-Cheks.  Blood sugar trend as below. Diet switch to carb modified. Recent Labs  Lab 05/23/22 0835 05/23/22 1252 05/23/22 1640 05/23/22 2100 05/24/22 0837  GLUCAP 194* 230* 221* 234* 172*     Chronic diastolic CHF  Clinically euvolemic.   Recent echo with EF 50-55% and G2 DD PTA on  Toprol 12.5 mg twice daily, Farxiga 10 g daily, Lasix 40 mg daily. Reportedly not taking Entresto. Continue Toprol.  Others on hold.  Blood pressure acceptable.  Peripheral artery disease On chart review it was noted that patient was last seen by vascular surgeon Dr. Stanford Breed in September 2023.  Patient had an angiogram done in August which showed peroneal only runoff below the knee, with arborization to the  posterior tibial artery.  Patient has been optimized from a vascular standpoint. Not sure why he was not on any aspirin or statin as an outpatient.  CAD Patient has not had any chest pain, shortness of breath or any anginal symptoms.  Continue beta-blocker.  Nocturia May have underlying BPH.  Has not been seen by urologist as an outpatient. If blood pressure allows, will start on low-dose of Flomax.  Impaired mobility Secondary to diabetes, advanced age, recent surgery. PT eval obtained.  Home health PT recommended.  Goals of care Patient is DNR at this time.  Daughter wants to switch him to full code for the time of surgery.  If he makes it through the surgery, she intends to change to DNR again.   Code Status: Full Code .    DVT prophylaxis:  heparin injection 5,000 Units Start: 05/21/22 0600   Antimicrobials: IV Rocephin, vancomycin Fluid: Reduce fluid rate to 50/h today. Consultants: Podiatry Family Communication: Family not at bedside today.  Status is: Inpatient Level of care: Med-Surg   Dispo: Patient is from: Home              Anticipated d/c is to: Pending clinical course. Continue in-hospital care because: May need repeat washout in OR in 1-2 days per podiatry.   Scheduled Meds:  acetaminophen  1,000 mg Oral TID   heparin  5,000 Units Subcutaneous Q8H   insulin aspart  0-5 Units Subcutaneous QHS   insulin aspart  0-6 Units Subcutaneous TID WC   melatonin  3 mg Oral QHS   metoprolol succinate  12.5 mg Oral Daily   metroNIDAZOLE  500 mg Oral Q12H   multivitamin with minerals  1 tablet Oral Daily   nutrition supplement (JUVEN)  1 packet Oral BID BM   Ensure Max Protein  11 oz Oral Daily    PRN meds: haloperidol lactate, HYDROmorphone (DILAUDID) injection, ondansetron **OR** ondansetron (ZOFRAN) IV, oxyCODONE   Infusions:   sodium chloride 75 mL/hr at 05/23/22 2323   cefTRIAXone (ROCEPHIN)  IV 2 g (05/24/22 0100)   vancomycin 1,250 mg (05/24/22 0907)     Diet:  Diet Order             Diet Carb Modified Fluid consistency: Thin; Room service appropriate? Yes  Diet effective now                   Antimicrobials: Anti-infectives (From admission, onward)    Start     Dose/Rate Route Frequency Ordered Stop   05/23/22 0820  vancomycin (VANCOCIN) powder  Status:  Discontinued          As needed 05/23/22 0820 05/23/22 0830   05/23/22 0820  tobramycin (NEBCIN) injection  Status:  Discontinued          As needed 05/23/22 0820 05/23/22 0830   05/21/22 1000  vancomycin (VANCOCIN) IVPB 1000 mg/200 mL premix  Status:  Discontinued        1,000 mg 200 mL/hr over 60 Minutes Intravenous Every 48 hours 05/21/22 0143 05/21/22 0148   05/21/22 1000  vancomycin (VANCOREADY) IVPB  1250 mg/250 mL        1,250 mg 166.7 mL/hr over 90 Minutes Intravenous Every 36 hours 05/21/22 0148     05/21/22 0200  metroNIDAZOLE (FLAGYL) tablet 500 mg        500 mg Oral Every 12 hours 05/21/22 0048 05/28/22 0159   05/21/22 0100  cefTRIAXone (ROCEPHIN) 2 g in sodium chloride 0.9 % 100 mL IVPB        2 g 200 mL/hr over 30 Minutes Intravenous Every 24 hours 05/21/22 0048 05/28/22 0059   05/20/22 2245  vancomycin (VANCOCIN) IVPB 1000 mg/200 mL premix        1,000 mg 200 mL/hr over 60 Minutes Intravenous  Once 05/20/22 2241 05/21/22 0145   05/20/22 2245  ceFEPIme (MAXIPIME) 2 g in sodium chloride 0.9 % 100 mL IVPB        2 g 200 mL/hr over 30 Minutes Intravenous  Once 05/20/22 2241 05/21/22 0018       Skin assessment:       Nutritional status:  Body mass index is 24.84 kg/m.  Nutrition Problem: Increased nutrient needs Etiology: wound healing Signs/Symptoms: estimated needs     Objective: Vitals:   05/24/22 0256 05/24/22 0838  BP: (!) 152/66 (!) 142/64  Pulse: (!) 57 64  Resp: 18 20  Temp: 97.9 F (36.6 C) 97.6 F (36.4 C)  SpO2: 94% 92%    Intake/Output Summary (Last 24 hours) at 05/24/2022 1107 Last data filed at 05/24/2022 0500 Gross  per 24 hour  Intake 2045.74 ml  Output 600 ml  Net 1445.74 ml    Filed Weights   05/22/22 0000 05/24/22 0258  Weight: 79.8 kg 85.4 kg   Weight change:  Body mass index is 24.84 kg/m.   Physical Exam: General exam: Pleasant, elderly Caucasian male.  Cheerful.  Not in distress Skin: No rashes, lesions or ulcers. HEENT: Atraumatic, normocephalic, no obvious bleeding Lungs: Clear to auscultation bilaterally CVS: Regular rate and rhythm, no murmur GI/Abd soft, nontender, nondistended, bowel sound present CNS: Alert, awake, oriented to place and person Psychiatry: Mood appropriate Extremities: No pedal edema, no calf tenderness.  Right foot ulcer s/p surgery.  Data Review: I have personally reviewed the laboratory data and studies available.  F/u labs ordered Unresulted Labs (From admission, onward)     Start     Ordered   05/24/22 [id number]  Basic metabolic panel  Daily,   R      05/23/22 1055   05/24/22 0500  CBC with Differential/Platelet  Daily,   R      05/23/22 1055            Total time spent in review of labs and imaging, patient evaluation, formulation of plan, documentation and communication with family: 64 minutes  Signed, Terrilee Croak, MD [facility] 05/24/2022

## 2022-05-24 NOTE — H&P (View-Only) (Signed)
Subjective:  Patient ID: Wayne [name], male    DOB: July 18, 1940,  MRN: [id number]  Patient seen at bedside this afternoon s/p right partial fifth ray amputation delayed primary closure. Patient relates doing well without pain. No compalints. Relates he did do some walking in the boot earlier today.   Past Medical History:  Diagnosis Date   Anemia of chronic disease    Chronic HFrEF (heart failure with reduced ejection fraction) (HCC)    Chronic kidney disease, stage 3a (HCC)    Coronary artery disease    Diabetes mellitus with circulatory complication (HCC)    Diabetic foot infection ([place])    Encephalopathy    Former tobacco use    PAD (peripheral artery disease) (HCC)    Peripheral neuropathy    SDH (subdural hematoma) (HCC)    Severe pulmonary hypertension ([place])      Past Surgical History:  Procedure Laterality Date   ABDOMINAL AORTOGRAM W/LOWER EXTREMITY Right 11/13/2021   Procedure: ABDOMINAL AORTOGRAM W/LOWER EXTREMITY;  Surgeon: Cherre Robins, MD;  Location: [facility];  Service: Cardiovascular;  Laterality: Right;   AMPUTATION Right 05/23/2022   Procedure: AMPUTATION RAY;  Surgeon: Yevonne Pax, DPM;  Location: [facility];  Service: Podiatry;  Laterality: Right;  Partial 5th ray amputation   CORONARY ANGIOPLASTY WITH STENT PLACEMENT         Latest Ref Rng & Units 05/24/2022    2:56 AM 05/23/2022    1:12 AM 05/22/2022    4:47 PM  CBC  WBC 4.0 - 10.5 K/uL 7.8  10.2  10.8   Hemoglobin 13.0 - 17.0 g/dL 12.2  12.6  12.6   Hematocrit 39.0 - 52.0 % 36.9  37.1  36.7   Platelets 150 - 400 K/uL 199  208  194        Latest Ref Rng & Units 05/24/2022    2:56 AM 05/23/2022    1:12 AM 05/22/2022    4:47 PM  BMP  Glucose 70 - 99 mg/dL 188  242  240   BUN 8 - 23 mg/dL 41  44  39   Creatinine 0.61 - 1.24 mg/dL 1.39  1.61  1.47   Sodium 135 - 145 mmol/L 138  136  135   Potassium 3.5 - 5.1 mmol/L 4.0  4.0  4.1   Chloride 98 - 111 mmol/L 106  105  103   CO2 22  - 32 mmol/L '21  20  21   '$ Calcium 8.9 - 10.3 mg/dL 8.5  8.4  8.4      Objective:   Vitals:   05/24/22 0256 05/24/22 0838  BP: (!) 152/66 (!) 142/64  Pulse: (!) 57 64  Resp: 18 20  Temp: 97.9 F (36.6 C) 97.6 F (36.4 C)  SpO2: 94% 92%    General:AA&O x 3. Normal mood and affect   Vascular: DP and PT pulses 2/4 bilateral. Brisk capillary refill to all digits. Pedal hair present   Neruological. Epicritic sensation grossly intact.   Derm: Lateral right foot wound with some granular tissue noted. Minimal erythema. No purulence. Minimal bleeding noted  MSK: MMT 5/5 in dorsiflexion, plantar flexion, inversion and eversion. Normal joint ROM without pain or crepitus.       Assessment & Plan:  Patient was evaluated and treated and all questions answered.  DX: s/p right partial fifth ray amputation.  Dressing changed and repacked today with WTD saline dressing.  Discussed with patient concern for healing of this wound and need  for return to OR for repeat washout and delayed primary closure. Discussed he remains high risk for non healing and limb loss due to vascular status and limited bleeding intraoperatively.  Will plan for return to [place] after midnight.   Lorenda Peck, DPM  Accessible via secure chat for questions or concerns.

## 2022-05-24 NOTE — Progress Notes (Signed)
Patient admitted to [facility] for Acute osteomyelitis of right foot. S/p right partial fifth ray amputation - 2/25. TOC following.

## 2022-05-25 ENCOUNTER — Other Ambulatory Visit: Payer: Self-pay

## 2022-05-25 ENCOUNTER — Inpatient Hospital Stay (HOSPITAL_COMMUNITY): Payer: Medicare Other

## 2022-05-25 ENCOUNTER — Encounter (HOSPITAL_COMMUNITY): Payer: Self-pay | Admitting: Family Medicine

## 2022-05-25 ENCOUNTER — Inpatient Hospital Stay (HOSPITAL_COMMUNITY): Payer: Medicare Other | Admitting: Anesthesiology

## 2022-05-25 ENCOUNTER — Encounter (HOSPITAL_COMMUNITY)
Admission: EM | Disposition: A | Discharge status: Home Health | Payer: Self-pay | Source: Home / Self Care | Attending: Internal Medicine

## 2022-05-25 DIAGNOSIS — Z87891 Personal history of nicotine dependence: Secondary | ICD-10-CM

## 2022-05-25 DIAGNOSIS — L089 Local infection of the skin and subcutaneous tissue, unspecified: Secondary | ICD-10-CM

## 2022-05-25 DIAGNOSIS — I739 Peripheral vascular disease, unspecified: Secondary | ICD-10-CM

## 2022-05-25 DIAGNOSIS — I509 Heart failure, unspecified: Secondary | ICD-10-CM | POA: Diagnosis not present

## 2022-05-25 DIAGNOSIS — I110 Hypertensive heart disease with heart failure: Secondary | ICD-10-CM

## 2022-05-25 DIAGNOSIS — M86171 Other acute osteomyelitis, right ankle and foot: Secondary | ICD-10-CM | POA: Diagnosis not present

## 2022-05-25 DIAGNOSIS — I2510 Atherosclerotic heart disease of native coronary artery without angina pectoris: Secondary | ICD-10-CM

## 2022-05-25 HISTORY — PX: IRRIGATION AND DEBRIDEMENT FOOT: SHX6602

## 2022-05-25 LAB — CBC WITH DIFFERENTIAL/PLATELET
Abs Immature Granulocytes: 0.1 10*3/uL — ABNORMAL HIGH (ref 0.00–0.07)
Basophils Absolute: 0.1 10*3/uL (ref 0.0–0.1)
Basophils Relative: 1 %
Eosinophils Absolute: 0.4 10*3/uL (ref 0.0–0.5)
Eosinophils Relative: 4 %
HCT: 38.9 % — ABNORMAL LOW (ref 39.0–52.0)
Hemoglobin: 12.8 g/dL — ABNORMAL LOW (ref 13.0–17.0)
Immature Granulocytes: 1 %
Lymphocytes Relative: 14 %
Lymphs Abs: 1.2 10*3/uL (ref 0.7–4.0)
MCH: 28.8 pg (ref 26.0–34.0)
MCHC: 32.9 g/dL (ref 30.0–36.0)
MCV: 87.6 fL (ref 80.0–100.0)
Monocytes Absolute: 0.8 10*3/uL (ref 0.1–1.0)
Monocytes Relative: 9 %
Neutro Abs: 6.1 10*3/uL (ref 1.7–7.7)
Neutrophils Relative %: 71 %
Platelets: 180 10*3/uL (ref 150–400)
RBC: 4.44 MIL/uL (ref 4.22–5.81)
RDW: 13.1 % (ref 11.5–15.5)
WBC: 8.6 10*3/uL (ref 4.0–10.5)
nRBC: 0 % (ref 0.0–0.2)

## 2022-05-25 LAB — CULTURE, BLOOD (ROUTINE X 2)
Culture: NO GROWTH
Culture: NO GROWTH
Special Requests: ADEQUATE
Special Requests: ADEQUATE

## 2022-05-25 LAB — BASIC METABOLIC PANEL
Anion gap: 10 (ref 5–15)
BUN: 37 mg/dL — ABNORMAL HIGH (ref 8–23)
CO2: 18 mmol/L — ABNORMAL LOW (ref 22–32)
Calcium: 8.4 mg/dL — ABNORMAL LOW (ref 8.9–10.3)
Chloride: 111 mmol/L (ref 98–111)
Creatinine, Ser: 1.18 mg/dL (ref 0.61–1.24)
GFR, Estimated: >60 mL/min (ref >60)
Glucose, Bld: 149 mg/dL — ABNORMAL HIGH (ref 70–99)
Potassium: 4.1 mmol/L (ref 3.5–5.1)
Sodium: 139 mmol/L (ref 135–145)

## 2022-05-25 LAB — GLUCOSE, CAPILLARY
Glucose-Capillary: 153 mg/dL — ABNORMAL HIGH (ref 70–99)
Glucose-Capillary: 167 mg/dL — ABNORMAL HIGH (ref 70–99)
Glucose-Capillary: 155 mg/dL — ABNORMAL HIGH (ref 70–99)
Glucose-Capillary: 157 mg/dL — ABNORMAL HIGH (ref 70–99)
Glucose-Capillary: 159 mg/dL — ABNORMAL HIGH (ref 70–99)

## 2022-05-25 LAB — SURGICAL PATHOLOGY

## 2022-05-25 SURGERY — IRRIGATION AND DEBRIDEMENT FOOT
Anesthesia: Monitor Anesthesia Care | Laterality: Right

## 2022-05-25 MED ORDER — VITAMIN C 500 MG PO TABS
500.0000 mg | ORAL_TABLET | Freq: Every day | ORAL | Status: DC
  Administered 2022-05-26 – 2022-05-29 (×4): 500 mg via ORAL
  Filled 2022-05-25 (×4): qty 1

## 2022-05-25 MED ORDER — DEXMEDETOMIDINE HCL IN NACL 80 MCG/20ML IV SOLN
INTRAVENOUS | Status: DC | PRN
  Administered 2022-05-25: 8 ug via BUCCAL

## 2022-05-25 MED ORDER — LIDOCAINE 2% (20 MG/ML) 5 ML SYRINGE
INTRAMUSCULAR | Status: AC
  Filled 2022-05-25: qty 5

## 2022-05-25 MED ORDER — FENTANYL CITRATE (PF) 100 MCG/2ML IJ SOLN: 25.0000 ug | INTRAMUSCULAR | Status: DC | PRN

## 2022-05-25 MED ORDER — PIPERACILLIN-TAZOBACTAM 3.375 G IVPB
3.3750 g | Freq: Three times a day (TID) | INTRAVENOUS | Status: DC
  Administered 2022-05-25 – 2022-05-27 (×6): 3.375 g via INTRAVENOUS
  Filled 2022-05-25 (×6): qty 50

## 2022-05-25 MED ORDER — POLYETHYLENE GLYCOL 3350 17 G PO PACK
17.0000 g | PACK | Freq: Every day | ORAL | Status: DC
  Administered 2022-05-26 – 2022-05-29 (×4): 17 g via ORAL
  Filled 2022-05-25 (×4): qty 1

## 2022-05-25 MED ORDER — ONDANSETRON HCL 4 MG/2ML IJ SOLN: 4.0000 mg | Freq: Once | INTRAMUSCULAR | Status: DC | PRN

## 2022-05-25 MED ORDER — LORAZEPAM 2 MG/ML IJ SOLN: 1.0000 mg | Freq: Four times a day (QID) | INTRAMUSCULAR | Status: DC | PRN

## 2022-05-25 MED ORDER — LIDOCAINE HCL (PF) 0.5 % IJ SOLN
INTRAMUSCULAR | Status: AC
  Filled 2022-05-25: qty 50

## 2022-05-25 MED ORDER — CHLORHEXIDINE GLUCONATE 0.12 % MT SOLN: 15.0000 mL | Freq: Once | OROMUCOSAL | Status: AC

## 2022-05-25 MED ORDER — ROCURONIUM BROMIDE 10 MG/ML (PF) SYRINGE
PREFILLED_SYRINGE | INTRAVENOUS | Status: AC
  Filled 2022-05-25: qty 10

## 2022-05-25 MED ORDER — INSULIN ASPART 100 UNIT/ML IJ SOLN: 0.0000 [IU] | INTRAMUSCULAR | Status: DC | PRN

## 2022-05-25 MED ORDER — SENNOSIDES-DOCUSATE SODIUM 8.6-50 MG PO TABS
1.0000 | ORAL_TABLET | Freq: Every day | ORAL | Status: DC
  Administered 2022-05-25 – 2022-05-28 (×4): 1 via ORAL
  Filled 2022-05-25 (×4): qty 1

## 2022-05-25 MED ORDER — ZINC SULFATE 220 (50 ZN) MG PO CAPS
220.0000 mg | ORAL_CAPSULE | Freq: Every day | ORAL | Status: DC
  Administered 2022-05-26 – 2022-05-29 (×4): 220 mg via ORAL
  Filled 2022-05-25 (×4): qty 1

## 2022-05-25 MED ORDER — PROPOFOL 10 MG/ML IV BOLUS
INTRAVENOUS | Status: AC
  Filled 2022-05-25: qty 20

## 2022-05-25 MED ORDER — LACTATED RINGERS IV SOLN: INTRAVENOUS | Status: DC

## 2022-05-25 MED ORDER — PROPOFOL 500 MG/50ML IV EMUL
INTRAVENOUS | Status: DC | PRN
  Administered 2022-05-25: 60 ug/kg/min via INTRAVENOUS

## 2022-05-25 MED ORDER — LIDOCAINE HCL 0.5 % IJ SOLN
INTRAMUSCULAR | Status: DC | PRN
  Administered 2022-05-25: 10 mL

## 2022-05-25 MED ORDER — LIDOCAINE 2% (20 MG/ML) 5 ML SYRINGE
INTRAMUSCULAR | Status: DC | PRN
  Administered 2022-05-25: 60 mg via INTRAVENOUS

## 2022-05-25 MED ORDER — BUPIVACAINE HCL (PF) 0.5 % IJ SOLN
INTRAMUSCULAR | Status: DC | PRN
  Administered 2022-05-25: 10 mL

## 2022-05-25 MED ORDER — DAPAGLIFLOZIN PROPANEDIOL 10 MG PO TABS
10.0000 mg | ORAL_TABLET | Freq: Every day | ORAL | Status: DC
  Administered 2022-05-26 – 2022-05-29 (×4): 10 mg via ORAL
  Filled 2022-05-25 (×5): qty 1

## 2022-05-25 MED ORDER — EPHEDRINE 5 MG/ML INJ
INTRAVENOUS | Status: AC
  Filled 2022-05-25: qty 15

## 2022-05-25 MED ORDER — FENTANYL CITRATE (PF) 100 MCG/2ML IJ SOLN
INTRAMUSCULAR | Status: AC
  Filled 2022-05-25: qty 2

## 2022-05-25 MED ORDER — ORAL CARE MOUTH RINSE: 15.0000 mL | Freq: Once | OROMUCOSAL | Status: AC

## 2022-05-25 MED ORDER — BUPIVACAINE HCL (PF) 0.5 % IJ SOLN
INTRAMUSCULAR | Status: AC
  Filled 2022-05-25: qty 30

## 2022-05-25 MED ORDER — ENSURE ENLIVE PO LIQD
237.0000 mL | Freq: Two times a day (BID) | ORAL | Status: DC
  Administered 2022-05-26 – 2022-05-29 (×7): 237 mL via ORAL

## 2022-05-25 MED ORDER — CHLORHEXIDINE GLUCONATE 0.12 % MT SOLN
OROMUCOSAL | Status: AC
  Administered 2022-05-25: 15 mL via OROMUCOSAL
  Filled 2022-05-25: qty 15

## 2022-05-25 SURGICAL SUPPLY — 49 items
APL PRP STRL LF DISP 70% ISPRP (MISCELLANEOUS) ×1
BAG COUNTER SPONGE SURGICOUNT (BAG) ×1 IMPLANT
BAG SPNG CNTER NS LX DISP (BAG) ×1
BLADE OSCILLATING/SAGITTAL (BLADE) ×1
BLADE SW THK.38XMED NAR THN (BLADE) IMPLANT
BNDG CMPR 9X4 STRL LF SNTH (GAUZE/BANDAGES/DRESSINGS)
BNDG ELASTIC 3X5.8 VLCR STR LF (GAUZE/BANDAGES/DRESSINGS) IMPLANT
BNDG ELASTIC 4X5.8 VLCR STR LF (GAUZE/BANDAGES/DRESSINGS) IMPLANT
BNDG ESMARK 4X9 LF (GAUZE/BANDAGES/DRESSINGS) IMPLANT
BNDG GAUZE DERMACEA FLUFF 4 (GAUZE/BANDAGES/DRESSINGS) IMPLANT
BNDG GZE DERMACEA 4 6PLY (GAUZE/BANDAGES/DRESSINGS)
CHLORAPREP W/TINT 26 (MISCELLANEOUS) ×1 IMPLANT
COVER SURGICAL LIGHT HANDLE (MISCELLANEOUS) ×1 IMPLANT
CUFF TOURN SGL QUICK 18X4 (TOURNIQUET CUFF) IMPLANT
CUFF TOURN SGL QUICK 34 (TOURNIQUET CUFF)
CUFF TRNQT CYL 34X4.125X (TOURNIQUET CUFF) IMPLANT
DRAPE U-SHAPE 47X51 STRL (DRAPES) ×1 IMPLANT
DRSG ADAPTIC 3X8 NADH LF (GAUZE/BANDAGES/DRESSINGS) IMPLANT
ELECT CAUTERY BLADE 6.4 (BLADE) ×1 IMPLANT
ELECT REM PT RETURN 9FT ADLT (ELECTROSURGICAL) ×1
ELECTRODE REM PT RTRN 9FT ADLT (ELECTROSURGICAL) ×1 IMPLANT
GAUZE PAD ABD 7.5X8 STRL (GAUZE/BANDAGES/DRESSINGS) IMPLANT
GAUZE SPONGE 4X4 12PLY STRL (GAUZE/BANDAGES/DRESSINGS) IMPLANT
GLOVE BIO SURGEON STRL SZ7 (GLOVE) ×1 IMPLANT
GLOVE BIOGEL PI IND STRL 7.5 (GLOVE) ×1 IMPLANT
GOWN STRL REUS W/ TWL LRG LVL3 (GOWN DISPOSABLE) ×2 IMPLANT
GOWN STRL REUS W/TWL LRG LVL3 (GOWN DISPOSABLE) ×2
GRAFT MYRIAD 3 LAYER 5X5 (Graft) IMPLANT
KIT BASIN OR (CUSTOM PROCEDURE TRAY) ×1 IMPLANT
KIT TURNOVER KIT B (KITS) ×1 IMPLANT
MANIFOLD NEPTUNE II (INSTRUMENTS) ×1 IMPLANT
NDL HYPO 25GX1X1/2 BEV (NEEDLE) IMPLANT
NEEDLE HYPO 25GX1X1/2 BEV (NEEDLE) IMPLANT
NS IRRIG 1000ML POUR BTL (IV SOLUTION) ×1 IMPLANT
PACK ORTHO EXTREMITY (CUSTOM PROCEDURE TRAY) ×1 IMPLANT
PAD ARMBOARD 7.5X6 YLW CONV (MISCELLANEOUS) ×2 IMPLANT
POWDER MYRIAD MORCLLS FINE 500 (Miscellaneous) IMPLANT
PROBE DEBRIDE SONICVAC MISONIX (TIP) IMPLANT
PWDR MYRIAD MORCELLS FINE 500 (Miscellaneous) ×1 IMPLANT
SET CYSTO W/LG BORE CLAMP LF (SET/KITS/TRAYS/PACK) ×1 IMPLANT
SOL PREP POV-IOD 4OZ 10% (MISCELLANEOUS) ×2 IMPLANT
SUT ETHILON 3 0 PS 1 (SUTURE) ×1 IMPLANT
SUT MNCRL AB 3-0 PS2 18 (SUTURE) ×1 IMPLANT
SYR BULB IRRIG 60ML STRL (SYRINGE) IMPLANT
SYR CONTROL 10ML LL (SYRINGE) IMPLANT
TOWEL GREEN STERILE (TOWEL DISPOSABLE) ×1 IMPLANT
TOWEL GREEN STERILE FF (TOWEL DISPOSABLE) ×1 IMPLANT
TUBE CONNECTING 12X1/4 (SUCTIONS) ×1 IMPLANT
YANKAUER SUCT BULB TIP NO VENT (SUCTIONS) ×1 IMPLANT

## 2022-05-25 NOTE — Progress Notes (Signed)
Upon returning to patient's room, patient is calm and says "I want to apologize for fussing at you earlier." Nurse reassured patient that we will care for him while he is in hospital-verbalized understanding. PRN Ativan held due to patient's calm and cooperative demeanor. Patient's gown and bed linens changed. Patient appears to be resting comfortably in bed. Patient's daughter at bedside. Bed lowered, call bell within reach, and bed alarm initiated.

## 2022-05-25 NOTE — Progress Notes (Signed)
PROGRESS NOTE  Wayne [name]  DOB: 05-13-1940  PCP: Baruch Gouty, [place] [id number]  DOA: 05/20/2022  LOS: 5 days  Hospital Day: 6  Brief narrative: Wayne [name] is a 82 y.o. male with PMH significant for DM2, CAD/stents, PAD, severe pulmonary hypertension, CHF, subdural hematoma, peripheral neuropathy  Patient has PAD and was seen by vascular surgery in September 2023.  He had angiography performed but did not have any options for reconstruction.  He had a foot ulcer overlying the right fifth MTP joint for close to 2 months, followed by podiatry. 2/6, he was seen by podiatrist Dr. Posey Pronto and to have right submetatarsal 5 ulceration with fat layer exposed.  2/22, patient was brought to the ED for worsening wound, weakness, confusion, fatigue, fever of 101.  In the ED, afebrile, hemodynamically stable.  WBC count elevated 10.6. X-ray right foot are concerning for ulceration over the fifth MTP with underlying osteomyelitis. Blood cultures were collected in the ED Started on IV cefepime and IV vancomycin Admitted to [place] consult obtained. 2/25, underwent partial fifth ray amputation by Dr. Loel Lofty  Subjective: Patient was seen and examined this morning.  Lying on bed.  Not in distress.  No new symptoms. Pending surgery this afternoon.  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   Underwent right partial fifth ray amputation this morning.  Per podiatrist, patient had extensive necrosis of the plantar compartment.  Patient will need to return to the OR in 2 to 3 days for repeat washout, partial closure and graft. There is high risk of nonhealing of the wound. Currently on IV Rocephin, IV vancomycin and oral Flagyl. WBC count improved.  No fever. Continue to monitor clinically. For pain control, he is currently on scheduled Tylenol, PRN  oxycodone, PRN IV Dilaudid Recent Labs  Lab 05/20/22 2013 05/21/22 0302 05/22/22 0149 05/22/22 1647 05/23/22 0112 05/24/22 0256 05/25/22 0048  WBC 10.6*   < > 12.0* 10.8* 10.2 7.8 8.6  LATICACIDVEN 1.0  --   --   --   --   --   --    < > = values in this interval not displayed.    AKI on CKD stage IIIa b/l creat 1.3 in 01/2022. Presented with creatinine elevated to 1.69.  Gradually improving.  1.18 today.  Continue IV fluid at 50 mill per hour for now. Recent Labs    12/03/21 0419 12/04/21 0513 02/16/22 1546 05/20/22 2013 05/21/22 0302 05/22/22 0149 05/22/22 1647 05/23/22 0112 05/24/22 0256 05/25/22 0048  BUN 32* 34* 48* 38* 34* 32* 39* 44* 41* 37*  CREATININE 1.13 1.17 1.39* 1.69* 1.49* 1.68* 1.47* 1.61* 1.39* [id number]   Acute metabolic encephalopathy Acute delirium Patient had intermittent episodes of confusion and restlessness.  Continue to maintain circadian rhythm.  Continue melatonin at bedtime ordered if needed. Haldol low-dose as needed if needed.  EKG from 2/22 with QTc 464 ms.  Type 2 diabetes mellitus A1c 5.9 in 05/20/2022 PTA on Farxiga 10 mg daily, glipizide 5 mg daily Currently on sliding scale insulin with Accu-Cheks.  Blood sugar trend as below.  Resume Farxiga today. Diet switch to carb modified. Recent Labs  Lab 05/24/22 1248 05/24/22 1734 05/24/22 2006 05/25/22 0903 05/25/22 1210  GLUCAP 258* 175* 165* 153* 167*    Chronic diastolic CHF  Clinically euvolemic.   Recent echo with EF 50-55% and G2 DD PTA on Toprol  12.5 mg twice daily, Farxiga 10 g daily, Lasix 40 mg daily. Reportedly not taking Entresto. Continue Toprol.  Blood pressure elevated.  Resume Farxiga today.  Lasix on hold.  Peripheral artery disease On chart review it was noted that patient was last seen by vascular surgeon Dr. Stanford Breed in September 2023.  Patient had an angiogram done in August which showed peroneal only runoff below the knee, with arborization to the posterior tibial  artery.  Patient has been optimized from a vascular standpoint. Not sure why he was not on any aspirin or statin as an outpatient.  CAD/stents Patient has not had any chest pain, shortness of breath or any anginal symptoms.  Continue beta-blocker.   Nocturia May have underlying BPH. Has not been seen by urologist as an outpatient. If blood pressure allows, will start on low-dose of Flomax.  Impaired mobility Secondary to diabetes, advanced age, recent surgery. PT eval obtained.  Home health PT recommended.  Goals of care Patient is DNR at this time.  Daughter wants to switch him to full code for the time of surgery.  If he makes it through the surgery, she intends to change to DNR again.   Code Status: Full Code.   DVT prophylaxis:  heparin injection 5,000 Units Start: 05/21/22 0600   Antimicrobials: IV Rocephin, vancomycin Fluid: Continue NS at a lower rate of 50 mill per hour.. Consultants: Podiatry Family Communication: Family not at bedside today.  Status is: Inpatient Level of care: Med-Surg   Dispo: Patient is from: Home              Anticipated d/c is to: Pending clinical course. Continue in-hospital care because: Repeat washout in OR today planned as well.   Scheduled Meds:  acetaminophen  1,000 mg Oral TID   dapagliflozin propanediol  10 mg Oral Daily   heparin  5,000 Units Subcutaneous Q8H   insulin aspart  0-5 Units Subcutaneous QHS   insulin aspart  0-6 Units Subcutaneous TID WC   melatonin  3 mg Oral QHS   metoprolol succinate  12.5 mg Oral Daily   multivitamin with minerals  1 tablet Oral Daily   nutrition supplement (JUVEN)  1 packet Oral BID BM   Ensure Max Protein  11 oz Oral Daily    PRN meds: haloperidol lactate, HYDROmorphone (DILAUDID) injection, ondansetron **OR** ondansetron (ZOFRAN) IV, oxyCODONE, polyethylene glycol   Infusions:   sodium chloride 50 mL/hr at 05/24/22 1115   piperacillin-tazobactam (ZOSYN)  IV      Diet:  Diet Order              Diet NPO time specified Except for: Sips with Meds  Diet effective midnight                   Antimicrobials: Anti-infectives (From admission, onward)    Start     Dose/Rate Route Frequency Ordered Stop   05/25/22 1400  piperacillin-tazobactam (ZOSYN) IVPB 3.375 g        3.375 g 12.5 mL/hr over 240 Minutes Intravenous Every 8 hours 05/25/22 1116     05/25/22 1000  vancomycin (VANCOREADY) IVPB 1250 mg/250 mL  Status:  Discontinued        1,250 mg 166.7 mL/hr over 90 Minutes Intravenous Every 24 hours 05/24/22 1238 05/25/22 1116   05/24/22 1330  ceFEPIme (MAXIPIME) 2 g in sodium chloride 0.9 % 100 mL IVPB  Status:  Discontinued        2 g 200 mL/hr over 30 Minutes  Intravenous Every 12 hours 05/24/22 1240 05/25/22 1116   05/23/22 0820  vancomycin (VANCOCIN) powder  Status:  Discontinued          As needed 05/23/22 0820 05/23/22 0830   05/23/22 0820  tobramycin (NEBCIN) injection  Status:  Discontinued          As needed 05/23/22 0820 05/23/22 0830   05/21/22 1000  vancomycin (VANCOCIN) IVPB 1000 mg/200 mL premix  Status:  Discontinued        1,000 mg 200 mL/hr over 60 Minutes Intravenous Every 48 hours 05/21/22 0143 05/21/22 0148   05/21/22 1000  vancomycin (VANCOREADY) IVPB 1250 mg/250 mL  Status:  Discontinued        1,250 mg 166.7 mL/hr over 90 Minutes Intravenous Every 36 hours 05/21/22 0148 05/24/22 1238   05/21/22 0200  metroNIDAZOLE (FLAGYL) tablet 500 mg  Status:  Discontinued        500 mg Oral Every 12 hours 05/21/22 0048 05/25/22 1116   05/21/22 0100  cefTRIAXone (ROCEPHIN) 2 g in sodium chloride 0.9 % 100 mL IVPB  Status:  Discontinued        2 g 200 mL/hr over 30 Minutes Intravenous Every 24 hours 05/21/22 0048 05/24/22 1240   05/20/22 2245  vancomycin (VANCOCIN) IVPB 1000 mg/200 mL premix        1,000 mg 200 mL/hr over 60 Minutes Intravenous  Once 05/20/22 2241 05/21/22 0145   05/20/22 2245  ceFEPIme (MAXIPIME) 2 g in sodium chloride 0.9 % 100 mL IVPB         2 g 200 mL/hr over 30 Minutes Intravenous  Once 05/20/22 2241 05/21/22 0018       Skin assessment:       Nutritional status:  Body mass index is 24.84 kg/m.  Nutrition Problem: Increased nutrient needs Etiology: wound healing Signs/Symptoms: estimated needs     Objective: Vitals:   05/25/22 0637 05/25/22 1324  BP: (!) 152/59 (!) 167/71  Pulse: 72 66  Resp: 16   Temp: 97.6 F (36.4 C) 97.7 F (36.5 C)  SpO2: 93% 94%    Intake/Output Summary (Last 24 hours) at 05/25/2022 1351 Last data filed at 05/25/2022 0600 Gross per 24 hour  Intake --  Output 800 ml  Net -800 ml   Filed Weights   05/22/22 0000 05/24/22 0258  Weight: 79.8 kg 85.4 kg   Weight change:  Body mass index is 24.84 kg/m.   Physical Exam: General exam: Pleasant, elderly Caucasian male.  Cheerful.  Not in distress Skin: No rashes, lesions or ulcers. HEENT: Atraumatic, normocephalic, no obvious bleeding Lungs: Clear to auscultation bilaterally CVS: Regular rate and rhythm, no murmur GI/Abd soft, nontender, nondistended, bowel sound present CNS: Alert, awake, oriented to place and person Psychiatry: Mood appropriate Extremities: No pedal edema, no calf tenderness.  Right foot ulcer s/p surgery.  Data Review: I have personally reviewed the laboratory data and studies available.  F/u labs ordered Unresulted Labs (From admission, onward)     Start     Ordered   05/24/22 [id number]  Basic metabolic panel  Daily,   R      05/23/22 1055   05/24/22 0500  CBC with Differential/Platelet  Daily,   R      05/23/22 1055            Total time spent in review of labs and imaging, patient evaluation, formulation of plan, documentation and communication with family: 64 minutes  Signed, Terrilee Croak, MD [facility] 05/25/2022

## 2022-05-25 NOTE — Brief Op Note (Signed)
05/25/2022  5:33 PM  PATIENT:  Wayne [name]  82 y.o. male  PRE-OPERATIVE DIAGNOSIS:  right foot infection  POST-OPERATIVE DIAGNOSIS:  right foot infection  PROCEDURE:  Procedure(s): IRRIGATION AND DEBRIDEMENT FOOT (Right)  SURGEON:  Surgeon(s) and Role:    * Trula Slade, DPM - Primary  PHYSICIAN ASSISTANT:   ASSISTANTS: none   ANESTHESIA:   MAC  EBL:  minimal   BLOOD ADMINISTERED:none  DRAINS: none   LOCAL MEDICATIONS USED:  MARCAINE   , BUPIVICAINE , and Amount: 20 ml  SPECIMEN:  Source of Specimen:  5th metatarsal for clean margin path/micro  DISPOSITION OF SPECIMEN:  PATHOLOGY  COUNTS:  YES  TOURNIQUET:  * Missing tourniquet times found for documented tourniquets in log: [id number] *  DICTATION: .Dragon Dictation  PLAN OF CARE: Admit to inpatient   PATIENT DISPOSITION:  PACU - hemodynamically stable.   Delay start of Pharmacological VTE agent (>24hrs) due to surgical blood loss or risk of bleeding: no  Intraoperative findings: Resected further 5th metatarsal and sent for path/micro. Extensive necrotic tissue present and debrided to bleeding tissue. Partially closed wound then the distal portion grafted.  NWB except for WB to heel for transfers. Await culture for antibiotic recommendations. Would recommend ID consult as well. Dressing change Wednesday. Podiatry will continue to follow.

## 2022-05-25 NOTE — Transfer of Care (Signed)
Immediate Anesthesia Transfer of Care Note  Patient: Wayne [name]  Procedure(s) Performed: IRRIGATION AND DEBRIDEMENT FOOT (Right)  Patient Location: PACU  Anesthesia Type:MAC  Level of Consciousness: awake, alert , and oriented  Airway & Oxygen Therapy: Patient Spontanous Breathing  Post-op Assessment: Report given to RN, Post -op Vital signs reviewed and stable, and Patient moving all extremities X 4  Post vital signs: Reviewed and stable  Last Vitals:  Vitals Value Taken Time  BP 155/59 05/25/22 1948  Temp 36.8 C 05/25/22 1948  Pulse 71 05/25/22 1948  Resp 24 05/25/22 1750  SpO2 94 % 05/25/22 1948  Vitals shown include unvalidated device data.  Last Pain:  Vitals:   05/25/22 1959  TempSrc:   PainSc: 0-No pain         Complications: No notable events documented.

## 2022-05-25 NOTE — Progress Notes (Signed)
Nutrition Follow-up  DOCUMENTATION CODES:   Non-severe (moderate) malnutrition in context of chronic illness  INTERVENTION:  Once diet resumes, recommend: Regular diet for nutrition optimization Transition from Ensure Max to Ensure Enlive po BID, each supplement provides 350 kcal and 20 grams of protein. MVI with minerals daily Discontinue Juven BID Vitamin C '500mg'$  daily Zinc '220mg'$  daily x14 days Recommend adding bowel regimen given last BM 4 days ago; discussed with MD  NUTRITION DIAGNOSIS:  Moderate Malnutrition related to chronic illness (CAD, PAD, CHF) as evidenced by mild fat depletion, mild muscle depletion.  GOAL:  Patient will meet greater than or equal to 90% of their needs - goal unmet  MONITOR:  PO intake, Diet advancement, Labs, Weight trends, Skin  REASON FOR ASSESSMENT:  Consult Wound healing  ASSESSMENT:  Pt with medical history significant for type 2 diabetes mellitus, CAD, PAD, pulmonary hypertension, and chronic diastolic CHF who presents with worsening right foot infection, fatigue, and fevers.  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 Noted hyperglycemia, however hyperglycemia not likely r/t to PO intake given meal completions  and consumption of Ensure Max are very minimal. Given increased needs for wound healing and malnutrition, pt would benefit from Regular diet and continued use of Ensure supplements. Reached out to DM coordinator to determine if insulin regimen can be adjusted to aid in glucose control.   Spoke with pt at bedside. His ex-wife also present at time of visit.   Pt is pleasant but provides limited history. When discussing osteomyelitis of foot and procedure today, he denies having infection of his foot. Flowsheet documentation reflects pt to be disoriented to time.   Pt states that he lives alone with his dog. He eats when he is  feeling hungry, usually about 3 times per day. He endorses eating poorly when he is in the hospital. His ex-wife reports that "he is a picky eater." Pt denies difficulty chewing or swallowing foods.   Pt suspects minimal weight loss. Reports a usual weight of 173-177 lbs. Reviewed weight history which reflects widely variable weight history within the last year. Noted pt's weight to fluctuate between 70-81 kg. Last documented weight was yesterday, measuring 85.4 kg   Medications: SSI 0-5 units qhs, SSI 0-6 units TID, melatonin, MVI, IV abx  Labs: BUN 37   CBG's 153-258 x24 hours  NUTRITION - FOCUSED PHYSICAL EXAM: Flowsheet Row Most Recent Value  Orbital Region Mild depletion  Upper Arm Region Severe depletion  Thoracic and Lumbar Region Mild depletion  Buccal Region Mild depletion  Temple Region Mild depletion  Clavicle Bone Region Mild depletion  Clavicle and Acromion Bone Region No depletion  Scapular Bone Region No depletion  Dorsal Hand Mild depletion  Patellar Region Moderate depletion  Anterior Thigh Region Mild depletion  Posterior Calf Region Mild depletion  Edema (RD Assessment) None  Hair Reviewed  Eyes Reviewed  Mouth Reviewed  Skin Reviewed  Nails Reviewed       Diet Order:   Diet Order             Diet NPO time specified Except for: Sips with Meds  Diet effective midnight                   EDUCATION NEEDS:   Education needs have been addressed  Skin:  Skin Assessment: Skin Integrity Issues: Skin Integrity Issues:: Incisions Diabetic Ulcer: rt foot  with osteomyelitis Incisions: R foot 5th ray amputation  Last BM:  2/23  Height:   Ht Readings from Last 1 Encounters:  05/22/22 '6\' 1"'$  (1.854 m)    Weight:   Wt Readings from Last 1 Encounters:  05/24/22 85.4 kg    Ideal Body Weight:  83.6 kg  BMI:  Body mass index is 24.84 kg/m.  Estimated Nutritional Needs:   Kcal:  2050-2250  Protein:  105-120 grams  Fluid:  > 2 L  Clayborne Dana, RDN, LDN Clinical Nutrition

## 2022-05-25 NOTE — Interval H&P Note (Signed)
History and Physical Interval Note:  05/25/2022 4:12 PM  Wayne [name]  has presented today for surgery, with the diagnosis of right foot infection.  The various methods of treatment have been discussed with the patient and family. After consideration of risks, benefits and other options for treatment, the patient has consented to  Procedure(s): IRRIGATION AND DEBRIDEMENT FOOT (Right) as a surgical intervention.  The patient's history has been reviewed, patient examined, no change in status, stable for surgery.  I have reviewed the patient's chart and labs.  Questions were answered to the patient's satisfaction.     Trula Slade

## 2022-05-25 NOTE — Op Note (Signed)
PATIENT:  Wayne [name]  82 y.o. male   PRE-OPERATIVE DIAGNOSIS:  right foot infection   POST-OPERATIVE DIAGNOSIS:  right foot infection   PROCEDURE:  Procedure(s): IRRIGATION AND DEBRIDEMENT FOOT (Right)   SURGEON:  Surgeon(s) and Role:    * Trula Slade, DPM - Primary   PHYSICIAN ASSISTANT:    ASSISTANTS: none    ANESTHESIA:   MAC   EBL:  minimal    BLOOD ADMINISTERED:none   DRAINS: none    LOCAL MEDICATIONS USED:  MARCAINE   , BUPIVICAINE , and Amount: 20 ml   SPECIMEN:  Source of Specimen:  5th metatarsal for clean margin path/micro   DISPOSITION OF SPECIMEN:  PATHOLOGY   COUNTS:  YES   TOURNIQUET:  * Missing tourniquet times found for documented tourniquets in log: [id number] *   DICTATION: .Dragon Dictation   PLAN OF CARE: Admit to inpatient    PATIENT DISPOSITION:  PACU - hemodynamically stable.   Delay start of Pharmacological VTE agent (>24hrs) due to surgical blood loss or risk of bleeding: no   Intraoperative findings: Resected further 5th metatarsal and sent for path/micro. Extensive necrotic tissue present and debrided to bleeding tissue. Partially closed wound then the distal portion grafted.   NWB except for WB to heel for transfers. Await culture for antibiotic recommendations. Would recommend ID consult as well. Dressing change Wednesday. Podiatry will continue to follow.     Indications for surgery: 82 year old male admitted for infection of his right foot and previous underwent partial fifth ray amputation.  Given the infection of his left open he is to return to the OR for further debridement, washout and closure of the wound.  I discussed the procedure with the patient as well as the patient's daughter and alternatives risks and complications were discussed.  No promises or guarantees given aspect of the procedure and all questions answered the best my ability.  Consent signed by patient's daughter.  Procedure in detail: The patient was  both verbally and visually identified by myself, nursing staff, anesthesia staff preoperatively.  He was then transferred to the operating via stretcher with the surgery took place.  After adequate plane of anesthesia was obtained timeout was performed and a mixture of lidocaine, Marcaine plain was infiltrated in a regional block fashion.  The right lower extremities and scrubbed, prepped in normal sterile fashion.  Attention was directed to the patient's right foot in the area the previous amputation site.  I started by utilizing a 15 blade scalpel to circumferentially excise the wound down to healthy, bleeding edges.  A loss of 15 blade scalpel as well as a rongeur debride all nonviable, necrotic, devitalized tissue to promote wound healing.  There was some probing present to the plantar aspect the midfoot but there is no purulence noted.  The fifth metatarsal remnant was exposed to utilize a sagittal saw to resect the distal portion.  Sent a clean margin for both pathology as well as microbiology.  I further debrided the wound.  No purulence noted.  Copious irrigate with saline hemostasis achieved.  Unable to bend close the proximal portion of the wound with 3-0 nylon however there was a 3 x 2 cm deficit distally what it was noticed in the skin for closure so therefore elected to use bolus Myriad Morcells Fine matrix of 500 cc and then I utilized a a pleasant the Myrid Matrix 5x5cm graft to cover the deficit and secured in place with skin staples.  I then applied Adaptic and  used staples to secure this.  Adaptic was applied over the incision followed by bulky dry sterile dressing making sure to pad all bony prominences.  He was woke from anesthesia and transferred to the procedure without any complications.  Transferred to PACU vital signs, vascular status intact.   I updated the patient's daughter and discussed the procedure in detail as well as the postoperative course.

## 2022-05-25 NOTE — Progress Notes (Signed)
Mobility Specialist - Progress Note   05/25/22 1524  Mobility  Activity Ambulated with assistance in room  Level of Assistance Moderate assist, patient does 50-74%  Assistive Device Front wheel walker  Distance Ambulated (ft) 40 ft  Activity Response Tolerated well  Mobility Referral Yes  $Mobility charge 1 Mobility   Pt was received in bed and agreeable to mobility. Pt was ModA from sit to stand and MinA throughout ambulation. No complaints throughout. Pt was returned to bed with all needs met.   Franki Monte  Mobility Specialist Please contact via Solicitor or Rehab office at [phone]

## 2022-05-25 NOTE — Progress Notes (Signed)
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 Pt is WBAT in the post-op shoe. PMH signficant for chronic HF, CKD IIIa, CAD, DM, encephalopathy, PAD, peripheral neuropathy, SDH, severe pulmonary HTN.    PT Comments    Pt greeted supine in bed and agreeable to session with continued progress towards acute goals. Pt needing increased assist to come to sitting EOB, reaching out for this PTAs hands to assist to elevate trunk. Pt grossly min guard to min assist for functional transfers and gait with RW support with pt continuing to require cues throughout for RW proximity and upright posture. Pt with no overt LOB throughout. Current plan remains appropriate to address deficits and maximize functional independence and safety. Pt continues to benefit from skilled PT services to progress toward functional mobility goals.    Recommendations for follow up therapy are one component of a multi-disciplinary discharge planning process, led by the attending physician.  Recommendations may be updated based on patient status, additional functional criteria and insurance authorization.  Follow Up Recommendations  Home health PT     Assistance Recommended at Discharge Intermittent Supervision/Assistance  Patient can return home with the following A little help with walking and/or transfers;A little help with bathing/dressing/bathroom;Assistance with cooking/housework;Assist for transportation;Help with stairs or ramp for entrance   Equipment Recommendations  None recommended by PT    Recommendations for Other Services       Precautions / Restrictions Precautions Precautions: Fall Required Braces or Orthoses: Other Brace Other Brace: Post-op shoe Restrictions Weight Bearing Restrictions: No      Mobility  Bed Mobility Overal bed mobility: Needs Assistance Bed Mobility: Supine to Sit, Sit to Supine     Supine to sit: Mod assist Sit to supine: Min assist   General bed mobility comments: Increased time, with heavy use of rails, pt reaching out and grabbing this PTAs hands to elevate trunk    Transfers Overall transfer level: Needs assistance Equipment used: Rolling walker (2 wheels) Transfers: Sit to/from Stand Sit to Stand: Min assist, Min guard           General transfer comment: Min assist initially progressing to min guard assist. VC's for hand placement on seated surface for safety.    Ambulation/Gait Ambulation/Gait assistance: Min guard Gait Distance (Feet): 225 Feet Assistive device: Rolling walker (2 wheels) Gait Pattern/deviations: Step-through pattern, Decreased stride length, Trunk flexed, Narrow base of support (Decreased floor clearance) Gait velocity: Decreased     General Gait Details: VC's for improved posture, closer walker proximity, and forward gaze. No assist required but hands on guarding provided throughout for safety. Pt outside walker during turns and requires multimodal cues to correct.   Stairs             Wheelchair Mobility    Modified Rankin (Stroke Patients Only)       Balance Overall balance assessment: Needs assistance Sitting-balance support: Feet supported, No upper extremity supported Sitting balance-Leahy Scale: Fair     Standing balance support: No upper extremity supported, During functional activity, Reliant on assistive device for balance Standing balance-Leahy Scale: Poor Standing balance comment: Fair statically but requires UE support for any dynamic standing activity.  Cognition Arousal/Alertness: Awake/alert Behavior During Therapy: WFL for tasks assessed/performed Overall Cognitive Status: Impaired/Different from baseline Area of Impairment: Orientation,  Safety/judgement, Awareness, Problem solving                 Orientation Level: Disoriented to, Time       Safety/Judgement: Decreased awareness of safety, Decreased awareness of deficits Awareness: Emergent Problem Solving: Slow processing, Requires verbal cues General Comments: mild confusion stating he does not remeber how he got to his room (possibly medication related from anesthesia) oriented to place and situation        Exercises      General Comments        Pertinent Vitals/Pain Pain Assessment Pain Assessment: No/denies pain    Home Living                          Prior Function            PT Goals (current goals can now be found in the care plan section) Acute Rehab PT Goals Patient Stated Goal: to see his dog PT Goal Formulation: With patient Time For Goal Achievement: 05/31/22 Progress towards PT goals: Progressing toward goals    Frequency    Min 5X/week      PT Plan      Co-evaluation              AM-PAC PT "6 Clicks" Mobility   Outcome Measure  Help needed turning from your back to your side while in a flat bed without using bedrails?: A Little Help needed moving from lying on your back to sitting on the side of a flat bed without using bedrails?: A Little Help needed moving to and from a bed to a chair (including a wheelchair)?: A Little Help needed standing up from a chair using your arms (e.g., wheelchair or bedside chair)?: A Little Help needed to walk in hospital room?: A Little Help needed climbing 3-5 steps with a railing? : A Little 6 Click Score: 18    End of Session Equipment Utilized During Treatment: Gait belt;Other (comment) (Post-op shoe) Activity Tolerance: Patient tolerated treatment well Patient left: in bed;with call bell/phone within reach;with bed alarm set Nurse Communication: Mobility status PT Visit Diagnosis: Unsteadiness on feet (R26.81);Muscle weakness (generalized) (M62.81);Difficulty  in walking, not elsewhere classified (R26.2)     Time: [id number] PT Time Calculation (min) (ACUTE ONLY): 26 min  Charges:  $Therapeutic Activity: 23-37 mins                     Vanessa Alesi R. PTA Acute Rehabilitation Services Office: [place] 05/25/2022, 9:41 AM

## 2022-05-25 NOTE — Anesthesia Procedure Notes (Signed)
Procedure Name: MAC Date/Time: 05/25/2022 4:27 PM  Performed by: Rande Brunt, CRNAPre-anesthesia Checklist: Patient identified, Emergency Drugs available, Suction available and Patient being monitored Oxygen Delivery Method: Simple face mask Preoxygenation: Pre-oxygenation with 100% oxygen Induction Type: IV induction Placement Confirmation: positive ETCO2 and CO2 detector

## 2022-05-25 NOTE — Anesthesia Preprocedure Evaluation (Addendum)
Anesthesia Evaluation  Patient identified by MRN, date of birth, ID band Patient awake    Reviewed: Allergy & Precautions, NPO status , Patient's Chart, lab work & pertinent test results, reviewed documented beta blocker date and time   Airway Mallampati: II  TM Distance: >3 FB Neck ROM: Full    Dental  (+) Dental Advisory Given, Edentulous Upper, Missing   Pulmonary former smoker   Pulmonary exam normal breath sounds clear to auscultation       Cardiovascular hypertension, Pt. on home beta blockers pulmonary hypertension+ CAD, + Peripheral Vascular Disease and +CHF  Normal cardiovascular exam Rhythm:Regular Rate:Normal     Neuro/Psych  Neuromuscular disease    GI/Hepatic negative GI ROS, Neg liver ROS,,,  Endo/Other  diabetes, Type 2, Oral Hypoglycemic Agents    Renal/GU Renal InsufficiencyRenal disease     Musculoskeletal right foot infection   Abdominal   Peds  Hematology  (+) Blood dyscrasia, anemia   Anesthesia Other Findings Day of surgery medications reviewed with the patient.  Reproductive/Obstetrics                             Anesthesia Physical Anesthesia Plan  ASA: 4  Anesthesia Plan: MAC   Post-op Pain Management: Tylenol PO (pre-op)*   Induction: Intravenous  PONV Risk Score and Plan: 1 and TIVA and Treatment may vary due to age or medical condition  Airway Management Planned: Natural Airway and Simple Face Mask  Additional Equipment:   Intra-op Plan:   Post-operative Plan:   Informed Consent: I have reviewed the patients History and Physical, chart, labs and discussed the procedure including the risks, benefits and alternatives for the proposed anesthesia with the patient or authorized representative who has indicated his/her understanding and acceptance.     Dental advisory given  Plan Discussed with: CRNA and Anesthesiologist  Anesthesia Plan Comments:         Anesthesia Quick Evaluation

## 2022-05-25 NOTE — Progress Notes (Signed)
Upon returning to unit, patient is oriented to person and place but disoriented to situation and time. Patient appears to be agitated and says "I want to go home. There is nothing wrong with my foot." Nurse explained to patient that he will likely stay overnight due to recent surgery on his right foot. Patient says "I don't care if it's unsafe. I want to go home." Patient daughter currently at bedside. Provider made aware of patient's current status. Ativan '1mg'$  ordered PRN. Bed lowered and call bell within reach.

## 2022-05-26 ENCOUNTER — Telehealth: Payer: Self-pay

## 2022-05-26 ENCOUNTER — Ambulatory Visit: Payer: Medicare Other | Admitting: Cardiology

## 2022-05-26 ENCOUNTER — Encounter (HOSPITAL_COMMUNITY): Payer: Self-pay | Admitting: Podiatry

## 2022-05-26 DIAGNOSIS — M86171 Other acute osteomyelitis, right ankle and foot: Secondary | ICD-10-CM | POA: Diagnosis not present

## 2022-05-26 DIAGNOSIS — E1169 Type 2 diabetes mellitus with other specified complication: Secondary | ICD-10-CM | POA: Diagnosis not present

## 2022-05-26 DIAGNOSIS — Z794 Long term (current) use of insulin: Secondary | ICD-10-CM | POA: Diagnosis not present

## 2022-05-26 DIAGNOSIS — E1151 Type 2 diabetes mellitus with diabetic peripheral angiopathy without gangrene: Secondary | ICD-10-CM | POA: Diagnosis not present

## 2022-05-26 DIAGNOSIS — E440 Moderate protein-calorie malnutrition: Secondary | ICD-10-CM | POA: Insufficient documentation

## 2022-05-26 DIAGNOSIS — Z7984 Long term (current) use of oral hypoglycemic drugs: Secondary | ICD-10-CM

## 2022-05-26 LAB — BASIC METABOLIC PANEL
Anion gap: 13 (ref 5–15)
BUN: 29 mg/dL — ABNORMAL HIGH (ref 8–23)
CO2: 16 mmol/L — ABNORMAL LOW (ref 22–32)
Calcium: 8.3 mg/dL — ABNORMAL LOW (ref 8.9–10.3)
Chloride: 109 mmol/L (ref 98–111)
Creatinine, Ser: 1.33 mg/dL — ABNORMAL HIGH (ref 0.61–1.24)
GFR, Estimated: 53 mL/min — ABNORMAL LOW (ref >60)
Glucose, Bld: 216 mg/dL — ABNORMAL HIGH (ref 70–99)
Potassium: 4.4 mmol/L (ref 3.5–5.1)
Sodium: 138 mmol/L (ref 135–145)

## 2022-05-26 LAB — AEROBIC/ANAEROBIC CULTURE W GRAM STAIN (SURGICAL/DEEP WOUND): Gram Stain: NONE SEEN

## 2022-05-26 LAB — CBC WITH DIFFERENTIAL/PLATELET
Abs Immature Granulocytes: 0.15 10*3/uL — ABNORMAL HIGH (ref 0.00–0.07)
Basophils Absolute: 0.1 10*3/uL (ref 0.0–0.1)
Basophils Relative: 1 %
Eosinophils Absolute: 0.3 10*3/uL (ref 0.0–0.5)
Eosinophils Relative: 4 %
HCT: 38.4 % — ABNORMAL LOW (ref 39.0–52.0)
Hemoglobin: 12.5 g/dL — ABNORMAL LOW (ref 13.0–17.0)
Immature Granulocytes: 2 %
Lymphocytes Relative: 13 %
Lymphs Abs: 1.2 10*3/uL (ref 0.7–4.0)
MCH: 29.1 pg (ref 26.0–34.0)
MCHC: 32.6 g/dL (ref 30.0–36.0)
MCV: 89.5 fL (ref 80.0–100.0)
Monocytes Absolute: 0.7 10*3/uL (ref 0.1–1.0)
Monocytes Relative: 8 %
Neutro Abs: 6.5 10*3/uL (ref 1.7–7.7)
Neutrophils Relative %: 72 %
Platelets: 260 10*3/uL (ref 150–400)
RBC: 4.29 MIL/uL (ref 4.22–5.81)
RDW: 13.3 % (ref 11.5–15.5)
WBC: 9 10*3/uL (ref 4.0–10.5)
nRBC: 0 % (ref 0.0–0.2)

## 2022-05-26 LAB — GLUCOSE, CAPILLARY
Glucose-Capillary: 177 mg/dL — ABNORMAL HIGH (ref 70–99)
Glucose-Capillary: 166 mg/dL — ABNORMAL HIGH (ref 70–99)
Glucose-Capillary: 187 mg/dL — ABNORMAL HIGH (ref 70–99)
Glucose-Capillary: 153 mg/dL — ABNORMAL HIGH (ref 70–99)

## 2022-05-26 MED ORDER — TAMSULOSIN HCL 0.4 MG PO CAPS
0.4000 mg | ORAL_CAPSULE | Freq: Every day | ORAL | Status: DC
  Administered 2022-05-26 – 2022-05-29 (×4): 0.4 mg via ORAL
  Filled 2022-05-26 (×4): qty 1

## 2022-05-26 NOTE — Progress Notes (Signed)
   Durable Medical Equipment (From admission, onward)        Start     Ordered  05/26/22 1412  For home use only DME Hospital bed  Once      Question Answer Comment Length of Need Lifetime  Bed type Semi-electric  Support Surface: Gel Overlay    05/26/22 1412

## 2022-05-26 NOTE — Progress Notes (Signed)
   PODIATRY PROGRESS NOTE Patient Name: Wayne [name]  DOB 1940/11/17 DOA 05/20/2022  Hospital Day: 7  Assessment:  82 y.o. 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   Now POD 1 s/p repeat Irrigation and debridement of amputation site with further 5th met resection, partial closure of amputation site and graft application  AF, VSS WBC 9 XR foot R: s/p R partial 5th ray amputation  Wound/Bone Cultures: E faecalis and P aeruginosa   Plan:  - S/p repeat I&D and partial closure with graft application to the right 5th ray amp site. - Bone path and OR cultures pending - ID consulted for abx recs, appreciate assistance.  - WBAT to right heel in post op shoe  for transfers only post op - Will plan to change dressing tomorrow Will continue to follow        Everitt Amber, [place]    Subjective:  Pt seen post op with ex wife bedside. States pain is minimal. Discussed plans moving forward. Explained risk is less so for residual infection and more so for non healing of the wound due to lack of vascular supply.   Objective:   Vitals:   05/26/22 0501 05/26/22 0759  BP: (!) 165/71 (!) 150/56  Pulse: 70 75  Resp:    Temp: 97.9 F (36.6 C) 98 F (36.7 C)  SpO2: 94% 91%       Latest Ref Rng & Units 05/26/2022   12:32 AM 05/25/2022   12:48 AM 05/24/2022    2:56 AM  CBC  WBC 4.0 - 10.5 K/uL 9.0  8.6  7.8   Hemoglobin 13.0 - 17.0 g/dL 12.5  12.8  12.2   Hematocrit 39.0 - 52.0 % 38.4  38.9  36.9   Platelets 150 - 400 K/uL 260  180  199        Latest Ref Rng & Units 05/26/2022   12:32 AM 05/25/2022   12:48 AM 05/24/2022    2:56 AM  BMP  Glucose 70 - 99 mg/dL 216  149  188   BUN 8 - 23 mg/dL 29  37  41   Creatinine 0.61 - 1.24 mg/dL 1.33  1.18  1.39   Sodium 135 - 145 mmol/L 138  139  138    Potassium 3.5 - 5.1 mmol/L 4.4  4.1  4.0   Chloride 98 - 111 mmol/L 109  111  106   CO2 22 - 32 mmol/L 16  18  21   $ Calcium 8.9 - 10.3 mg/dL 8.3  8.4  8.5     General: AAOx3, NAD  Lower Extremity Exam Lower Extremity Exam Vasc:     R - PT non palpable, DP non palpable. Cap refill < 3 sec to digits                  Derm:    R - Dressing C/D/I     MSK:     R - Edema of the right lateral forefoot     Neuro:   R - Gross sensation absent. Gross motor function intact           Radiology:  Results reviewed. See assessment for pertinent imaging results

## 2022-05-26 NOTE — TOC Progression Note (Addendum)
Transition of Care [facility]) - Progression Note    Patient Details  Name: Wayne [name] MRN: [id number] Date of Birth: [birth year]  Transition of Care [facility]) CM/SW [place], RN Phone Number: 05/26/2022, 2:15 PM  Clinical Narrative:     Damaris Schooner to Jeani Hawking, daughter, regarding transition needs.  Patient will need hospital bed. Notified Kim with [facility] of order for bed. Patient will need iv antibiotics at discharge.  Notified Pam with [facility] of possible need for iv antibiotic. Cory with [facility] notified of need to add nursing to home health. TOC will continue to follow.   Expected Discharge Plan: [place] Barriers to Discharge: Continued Medical Work up  Expected Discharge Plan and Services   Discharge Planning Services: CM Consult Post Acute Care Choice: [place] arrangements for the past 2 months: [place]                 DME Arranged: Hospital bed DME Agency: [facility] Date DME Agency Contacted: 05/26/22 Time DME Agency Contacted: [id number] Representative spoke with at DME Agency: Jeani Hawking HH Arranged: PT, RN [facility] Agency: [place] Date [place]: 05/26/22 Time [place]: [id number] Representative spoke with at [place]: [place] ([place]) Interventions SDOH Screenings   Food Insecurity: No Food Insecurity (05/22/2022)  Housing: Low Risk  (05/22/2022)  Transportation Needs: No Transportation Needs (05/22/2022)  Utilities: Not At Risk (05/22/2022)  Depression (PHQ2-9): Low Risk  (05/05/2022)  Financial Resource Strain: Low Risk  (05/05/2022)  Physical Activity: Inactive (05/05/2022)  Social Connections: Socially Isolated (05/05/2022)  Stress: No Stress Concern Present (05/05/2022)  Tobacco Use: Medium Risk (05/26/2022)    Readmission Risk Interventions    11/12/2021    2:59 PM  Readmission Risk Prevention Plan  Post Dischage Appt Complete  Medication Screening Complete   Transportation Screening Complete

## 2022-05-26 NOTE — Consult Note (Signed)
[facility] for Infectious Disease    Date of Admission:  05/20/2022      Total days of antibiotics 6   Zosyn 2/27 >> C               Reason for Consult: osteomyelitis Rt foot     Referring Provider: Jacqualyn Posey Primary Care Provider: Baruch Gouty, FNP    Assessment: Elic Alberti is a 82 y.o. 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 Most recent OR trips required further debridement of necrotic tissue and further bone resection - pathology and repeat micro pending, initial cultures with abundant enterococcus faecalis in both tissue samples and rare pseudomonas (1 tissue sample). Narrowed to Zosyn yesterday with likely anaerobe also involved.   Follow pending micro until maturity. Hopeful we can convert to PO regimen (Levaquin + Metronidazole?) to spare him of PICC line for pseudomonas tx as he would likely need SNF for IV antibiotic support.  Would anticipate 4-6 weeks of treatment pending how he does.   ESR & CRP baseline 72 / 18.2, respectively.     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)   CAD S/P percutaneous coronary angioplasty   PAD (peripheral artery disease) (HCC)   Chronic diastolic CHF (congestive heart failure) (HCC)   Acute renal failure superimposed on stage 3a chronic kidney disease (HCC)   Malnutrition of moderate degree    acetaminophen  1,000 mg Oral TID   ascorbic acid  500 mg Oral Daily   dapagliflozin propanediol  10 mg Oral Daily   feeding supplement  237 mL Oral BID BM   heparin  5,000 Units Subcutaneous Q8H   insulin aspart  0-5 Units Subcutaneous QHS   insulin aspart  0-6 Units Subcutaneous TID WC   melatonin  3 mg Oral QHS   metoprolol succinate  12.5 mg Oral Daily    multivitamin with minerals  1 tablet Oral Daily   polyethylene glycol  17 g Oral Daily   senna-docusate  1 tablet Oral QHS   zinc sulfate  220 mg Oral Daily    HPI: Damichael Proia is a 82 y.o. male admitted from home for management of right diabetic foot infection.   PMHx diabetes T2 (A1C 7.0% in Nov 2023), PAD (not a candidate for any tx),   Presented from home, AKI on admission with SCr 1.69.   Feb 25th went to the OR for Rt 5th ray amputation d/t gas gangrene and acute OM. Deep wound culture growing out abundant Enterococcus Faecalis (amp sensitive) and rare Pseudmonoas as well as possible anaerobe. Dr. Jacqualyn Posey took the patient back to OR yesterday 2/27 for repeat wash out and further resection of metatarsal head (sent for path and micro) - partially closed.   He does not recall coming to hospital. Reports he lives at home alone.   Review of Systems: Review of Systems  Constitutional:  Negative for chills and fever.  Gastrointestinal:  Negative for abdominal pain, nausea and vomiting.  Musculoskeletal:  Negative for joint pain.     Past Medical History:  Diagnosis Date   Anemia of chronic disease    Chronic HFrEF (heart failure with reduced ejection fraction) (HCC)    Chronic kidney  disease, stage 3a ([place])    Coronary artery disease    Diabetes mellitus with circulatory complication (HCC)    Diabetic foot infection ([place])    Encephalopathy    Former tobacco use    PAD (peripheral artery disease) (HCC)    Peripheral neuropathy    SDH (subdural hematoma) (HCC)    Severe pulmonary hypertension (HCC)     Social History   Tobacco Use   Smoking status: Former    Types: Cigarettes    Quit date: 1993    Years since quitting: 31.1   Smokeless tobacco: Never  Vaping Use   Vaping Use: Never used  Substance Use Topics   Alcohol use: No   Drug use: No    Family History  Problem Relation Age of Onset   Cervical cancer Mother    Down syndrome Sister    Diabetes type  II Neg Hx    No Known Allergies  OBJECTIVE: Blood pressure (!) 150/56, pulse 75, temperature 98 F (36.7 C), temperature source Oral, resp. rate 16, height '6\' 1"'$  (1.854 m), weight 83.4 kg, SpO2 91 %.  Physical Exam Vitals reviewed.  Constitutional:      Appearance: He is not ill-appearing.  Cardiovascular:     Rate and Rhythm: Normal rate.  Pulmonary:     Effort: Pulmonary effort is normal.     Breath sounds: Normal breath sounds.  Abdominal:     General: There is no distension.     Tenderness: There is no abdominal tenderness.  Musculoskeletal:     Comments: Rt foot dressing clean/dry   Skin:    General: Skin is warm and dry.  Neurological:     Mental Status: He is alert.     Lab Results Lab Results  Component Value Date   WBC 9.0 05/26/2022   HGB 12.5 (L) 05/26/2022   HCT 38.4 (L) 05/26/2022   MCV 89.5 05/26/2022   PLT 260 05/26/2022    Lab Results  Component Value Date   CREATININE 1.33 (H) 05/26/2022   BUN 29 (H) 05/26/2022   NA 138 05/26/2022   K 4.4 05/26/2022   CL 109 05/26/2022   CO2 16 (L) 05/26/2022    Lab Results  Component Value Date   ALT 13 05/20/2022   AST 15 05/20/2022   ALKPHOS 148 (H) 05/20/2022   BILITOT 1.0 05/20/2022     Microbiology: Recent Results (from the past 240 hour(s))  Culture, blood (Routine x 2)     Status: None   Collection Time: 05/20/22  8:13 PM   Specimen: BLOOD  Result Value Ref Range Status   Specimen Description BLOOD LEFT ANTECUBITAL  Final   Special Requests   Final    BOTTLES DRAWN AEROBIC AND ANAEROBIC Blood Culture adequate volume   Culture   Final    NO GROWTH 5 DAYS Performed at [facility], [address], [place], [place] [address]    Report Status 05/25/2022 FINAL  Final  Culture, blood (Routine x 2)     Status: None   Collection Time: 05/20/22  8:13 PM   Specimen: BLOOD  Result Value Ref Range Status   Specimen Description BLOOD BLOOD RIGHT ARM  Final   Special Requests   Final    BOTTLES  DRAWN AEROBIC AND ANAEROBIC Blood Culture adequate volume   Culture   Final    NO GROWTH 5 DAYS Performed at [facility], [address], [place], [place] [address]    Report Status 05/25/2022 FINAL  Final  Aerobic/Anaerobic Culture w Gram Stain (surgical/deep wound)     Status: None (Preliminary result)   Collection Time: 05/23/22  8:05 AM   Specimen: PATH Soft tissue resection  Result Value Ref Range Status   Specimen Description TISSUE  Final   Special Requests RIGHT 5TH RAY MPJ  Final   Gram Stain   Final    NO WBC SEEN FEW GRAM POSITIVE COCCI IN PAIRS RARE GRAM NEGATIVE RODS Performed at [facility], [address], [place], [place] [address]    Culture   Final    ABUNDANT ENTEROCOCCUS FAECALIS RARE PSEUDOMONAS AERUGINOSA SUSCEPTIBILITIES TO FOLLOW NO ANAEROBES ISOLATED; CULTURE IN PROGRESS FOR 5 DAYS    Report Status PENDING  Incomplete   Organism ID, Bacteria ENTEROCOCCUS FAECALIS  Final      Susceptibility   Enterococcus faecalis - MIC*    AMPICILLIN <=2 SENSITIVE Sensitive     VANCOMYCIN 1 SENSITIVE Sensitive     GENTAMICIN SYNERGY SENSITIVE Sensitive     * ABUNDANT ENTEROCOCCUS FAECALIS  Aerobic/Anaerobic Culture w Gram Stain (surgical/deep wound)     Status: None (Preliminary result)   Collection Time: 05/23/22  8:06 AM   Specimen: PATH Other; Tissue  Result Value Ref Range Status   Specimen Description WOUND  Final   Special Requests RIGHT 5TH RAY MPJ  Final   Gram Stain   Final    NO WBC SEEN MODERATE GRAM POSITIVE COCCI IN PAIRS    Culture   Final    ABUNDANT ENTEROCOCCUS FAECALIS SUSCEPTIBILITIES PERFORMED ON PREVIOUS CULTURE WITHIN THE LAST 5 DAYS. CULTURE REINCUBATED FOR BETTER GROWTH HOLDING FOR POSSIBLE ANAEROBE Performed at [facility], [place] [address], [place], [place] [address]    Report Status PENDING  Incomplete    Janene Madeira, MSN, NP-C [facility] [place] Pager:  [phone]  05/26/2022 9:55 AM

## 2022-05-26 NOTE — Telephone Encounter (Addendum)
Called patient regarding results. Spoke with patietns daughter Jeani Hawking, patients daughter had understnading of results.----- Message from Lendon Colonel, NP sent at 05/03/2022  8:26 AM EST ----- Significant improvement in his heart pumping function from 35% to normal at 50-55%. The Delene Loll has made a big difference and should be continued. Hopefully, the PT frequency has increased to be there more than once a week. The report is very good  Curt Bears

## 2022-05-26 NOTE — TOC Progression Note (Signed)
Transition of Care [facility]) - Progression Note    Patient Details  Name: Wayne [name] MRN: [id number] Date of Birth: 02-22-1941  Transition of Care [facility]) CM/SW [place], [place] Phone Number: 05/26/2022, 10:54 AM  Clinical Narrative:     CSW met with pt to discuss PT rec for SNF. Pt confirms he would rather return home with [facility] instead of SNF. He explains his daughter would arrange for private care givers for supervision/support. He consents to [place] contacting daughter.   CSW called pt's daughter and explained PT rec for SNF. She confirms that they would prefer to have pt to return to his home with [facility] instead of SNF. Pt is active with [facility]. Daughter reports that pt already has a wheelchair, 3-in-1, and bathroom grab bars. They also have an elevator to get him upstairs. She does request hospital bed. CSW informed daughter that RNCM would assist with home DC needs and may contact her. CSW updated RNCM  Expected Discharge Plan: [place] Barriers to Discharge: Continued Medical Work up  Expected Discharge Plan and Services   Discharge Planning Services: CM Consult Post Acute Care Choice: [place] arrangements for the past 2 months: [place]: PT [place]: [place] Date [place]: 05/24/22 Time [place]: 40 Representative spoke with at [place]: [place] ([place]) Interventions SDOH Screenings   Food Insecurity: No Food Insecurity (05/22/2022)  Housing: Low Risk  (05/22/2022)  Transportation Needs: No Transportation Needs (05/22/2022)  Utilities: Not At Risk (05/22/2022)  Depression (PHQ2-9): Low Risk  (05/05/2022)  Financial Resource Strain: Low Risk  (05/05/2022)  Physical Activity: Inactive (05/05/2022)  Social Connections: Socially Isolated (05/05/2022)  Stress: No Stress Concern Present (05/05/2022)  Tobacco Use: Medium Risk  (05/25/2022)    Readmission Risk Interventions    11/12/2021    2:59 PM  Readmission Risk Prevention Plan  Post Dischage Appt Complete  Medication Screening Complete  Transportation Screening Complete

## 2022-05-26 NOTE — Progress Notes (Signed)
PROGRESS NOTE  Wayne [name]  DOB: 14-Dec-1940  PCP: Baruch Gouty, [place] [id number]  DOA: 05/20/2022  LOS: 6 days  Hospital Day: 7  Brief narrative: Wayne [name] is a 82 y.o. male with PMH significant for DM2, CAD/stents, PAD, severe pulmonary hypertension, CHF, subdural hematoma, peripheral neuropathy  Patient has PAD and was seen by vascular surgery in September 2023.  He had angiography performed but did not have any options for reconstruction.  He had a foot ulcer overlying the right fifth MTP joint for close to 2 months, followed by podiatry. 2/6, he was seen by podiatrist Dr. Posey Pronto and to have right submetatarsal 5 ulceration with fat layer exposed.  2/22, patient was brought to the ED for worsening wound, weakness, confusion, fatigue, fever of 101.  In the ED, afebrile, hemodynamically stable.  WBC count elevated 10.6. X-ray right foot are concerning for ulceration over the fifth MTP with underlying osteomyelitis. Blood cultures were collected in the ED Started on IV cefepime and IV vancomycin Admitted to [place] consult obtained. 2/25, underwent partial fifth ray amputation by Dr. Loel Lofty  Subjective: Patient was seen and examined this morning.  Lying down on bed.  Not in distress.  No new symptoms. Pleasantly confused.  Unaware that he had surgery done yesterday.  Not restless or agitated.  Family not at bedside. Podiatry recommended ID consultation.  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   2/25, underwent right partial fifth ray amputation.  Per podiatrist, patient had extensive necrosis of the plantar compartment.   2/27, underwent repeat I&D of right foot.  Follow-up note, extensive necrotic tissue was present.  May need further I&D/closure in the OR.  Defer to podiatry. Currently on IV Rocephin, IV vancomycin and oral  Flagyl. ID consulted. WBC count improved.  No fever. Continue to monitor clinically. For pain control, he is currently on scheduled Tylenol, PRN oxycodone, PRN IV Dilaudid Recent Labs  Lab 05/20/22 2013 05/21/22 0302 05/22/22 1647 05/23/22 0112 05/24/22 0256 05/25/22 0048 05/26/22 0032  WBC 10.6*   < > 10.8* 10.2 7.8 8.6 9.0  LATICACIDVEN 1.0  --   --   --   --   --   --    < > = values in this interval not displayed.  Acute metabolic encephalopathy Patient remains pleasantly confused in the hospital.  At times not oriented to situation.  Postprocedure yesterday, patient noted restless and agitated but good controlled shortly after that and apologized. Continue as needed Haldol.   AKI on CKD stage IIIa b/l creat 1.3 in 01/2022. Presented with creatinine elevated to 1.69.  Gradually improving creatinine.  Slightly up in last 24 hours.  Continue low rate of IV fluid. Recent Labs    12/04/21 0513 02/16/22 1546 05/20/22 2013 05/21/22 0302 05/22/22 0149 05/22/22 1647 05/23/22 0112 05/24/22 0256 05/25/22 0048 05/26/22 0032  BUN 34* 48* 38* 34* 32* 39* 44* 41* 37* 29*  CREATININE 1.17 1.39* 1.69* 1.49* 1.68* 1.47* 1.61* 1.39* 1.18 [id number]*   Acute metabolic encephalopathy Acute delirium Patient had intermittent episodes of confusion and restlessness.  Continue to maintain circadian rhythm.  Continue melatonin at bedtime ordered if needed. Haldol low-dose as needed if needed.  EKG from 2/22 with QTc 464 ms.  Type 2 diabetes mellitus A1c 5.9 in 05/20/2022 PTA on Farxiga 10 mg daily, glipizide 5 mg daily Currently continued on Farxiga and sliding  scale insulin with Accu-Cheks.  Blood sugar trend as below.   Continue carb modified. Recent Labs  Lab 05/25/22 1210 05/25/22 1454 05/25/22 1721 05/25/22 2208 05/26/22 0803  GLUCAP 167* 155* 157* 159* 177*    Chronic diastolic CHF  Clinically euvolemic.   Recent echo with EF 50-55% and G2 DD PTA on Toprol 12.5 mg twice daily,  Farxiga 10 g daily, Lasix 40 mg daily. Reportedly not taking Entresto. Continue Toprol and [place]. Lasix remains on hold. Blood pressure reasonably controlled.  Flomax started today.  Continue monitor blood pressure.    Peripheral artery disease On chart review it was noted that patient was last seen by vascular surgeon Dr. Stanford Breed in September 2023.  Patient had an angiogram done in August which showed peroneal only runoff below the knee, with arborization to the posterior tibial artery.  Patient has been optimized from a vascular standpoint. Not sure why he was not on any aspirin or statin as an outpatient.  CAD/stents Patient has not had any chest pain, shortness of breath or any anginal symptoms.  Continue beta-blocker. Not sure why he was not on any aspirin or statin as an outpatient.  Nocturia May have underlying BPH. Has not been seen by urologist as an outpatient. I will start low-dose Flomax today.  Impaired mobility Secondary to diabetes, advanced age, recent surgery. PT eval obtained.  Home health PT recommended.  Goals of care Patient is DNR at this time.  Daughter wants to switch him to full code for the time of surgery.  If he makes it through the surgery, she intends to change to DNR again.   Code Status: Full Code.   DVT prophylaxis:  heparin injection 5,000 Units Start: 05/21/22 0600   Antimicrobials: IV Rocephin, vancomycin Fluid: Continue NS at a lower rate of 50 mill per hour.. Consultants: Podiatry Family Communication: Family not at bedside today.  Status is: Inpatient Level of care: Med-Surg   Dispo: Patient is from: Home              Anticipated d/c is to: Pending clinical course. Continue in-hospital care because: May need repeat I&D in OR.   Scheduled Meds:  acetaminophen  1,000 mg Oral TID   ascorbic acid  500 mg Oral Daily   dapagliflozin propanediol  10 mg Oral Daily   feeding supplement  237 mL Oral BID BM   heparin  5,000 Units Subcutaneous  Q8H   insulin aspart  0-5 Units Subcutaneous QHS   insulin aspart  0-6 Units Subcutaneous TID WC   melatonin  3 mg Oral QHS   metoprolol succinate  12.5 mg Oral Daily   multivitamin with minerals  1 tablet Oral Daily   polyethylene glycol  17 g Oral Daily   senna-docusate  1 tablet Oral QHS   tamsulosin  0.4 mg Oral QPC breakfast   zinc sulfate  220 mg Oral Daily    PRN meds: haloperidol lactate, HYDROmorphone (DILAUDID) injection, LORazepam, ondansetron **OR** ondansetron (ZOFRAN) IV, oxyCODONE   Infusions:   sodium chloride 50 mL/hr at 05/25/22 1927   piperacillin-tazobactam (ZOSYN)  IV 3.375 g (05/26/22 0509)    Diet:  Diet Order             Diet regular Room service appropriate? Yes; Fluid consistency: Thin  Diet effective now                   Antimicrobials: Anti-infectives (From admission, onward)    Start     Dose/Rate  Route Frequency Ordered Stop   05/25/22 1400  piperacillin-tazobactam (ZOSYN) IVPB 3.375 g        3.375 g 12.5 mL/hr over 240 Minutes Intravenous Every 8 hours 05/25/22 1116     05/25/22 1000  vancomycin (VANCOREADY) IVPB 1250 mg/250 mL  Status:  Discontinued        1,250 mg 166.7 mL/hr over 90 Minutes Intravenous Every 24 hours 05/24/22 1238 05/25/22 1116   05/24/22 1330  ceFEPIme (MAXIPIME) 2 g in sodium chloride 0.9 % 100 mL IVPB  Status:  Discontinued        2 g 200 mL/hr over 30 Minutes Intravenous Every 12 hours 05/24/22 1240 05/25/22 1116   05/23/22 0820  vancomycin (VANCOCIN) powder  Status:  Discontinued          As needed 05/23/22 0820 05/23/22 0830   05/23/22 0820  tobramycin (NEBCIN) injection  Status:  Discontinued          As needed 05/23/22 0820 05/23/22 0830   05/21/22 1000  vancomycin (VANCOCIN) IVPB 1000 mg/200 mL premix  Status:  Discontinued        1,000 mg 200 mL/hr over 60 Minutes Intravenous Every 48 hours 05/21/22 0143 05/21/22 0148   05/21/22 1000  vancomycin (VANCOREADY) IVPB 1250 mg/250 mL  Status:  Discontinued         1,250 mg 166.7 mL/hr over 90 Minutes Intravenous Every 36 hours 05/21/22 0148 05/24/22 1238   05/21/22 0200  metroNIDAZOLE (FLAGYL) tablet 500 mg  Status:  Discontinued        500 mg Oral Every 12 hours 05/21/22 0048 05/25/22 1116   05/21/22 0100  cefTRIAXone (ROCEPHIN) 2 g in sodium chloride 0.9 % 100 mL IVPB  Status:  Discontinued        2 g 200 mL/hr over 30 Minutes Intravenous Every 24 hours 05/21/22 0048 05/24/22 1240   05/20/22 2245  vancomycin (VANCOCIN) IVPB 1000 mg/200 mL premix        1,000 mg 200 mL/hr over 60 Minutes Intravenous  Once 05/20/22 2241 05/21/22 0145   05/20/22 2245  ceFEPIme (MAXIPIME) 2 g in sodium chloride 0.9 % 100 mL IVPB        2 g 200 mL/hr over 30 Minutes Intravenous  Once 05/20/22 2241 05/21/22 0018       Skin assessment:       Nutritional status:  Body mass index is 24.26 kg/m.  Nutrition Problem: Moderate Malnutrition Etiology: chronic illness (CAD, PAD, CHF) Signs/Symptoms: mild fat depletion, mild muscle depletion     Objective: Vitals:   05/26/22 0501 05/26/22 0759  BP: (!) 165/71 (!) 150/56  Pulse: 70 75  Resp:    Temp: 97.9 F (36.6 C) 98 F (36.7 C)  SpO2: 94% 91%    Intake/Output Summary (Last 24 hours) at 05/26/2022 1050 Last data filed at 05/26/2022 0600 Gross per 24 hour  Intake 692.45 ml  Output 200 ml  Net 492.45 ml   Filed Weights   05/22/22 0000 05/24/22 0258 05/26/22 0500  Weight: 79.8 kg 85.4 kg 83.4 kg   Weight change:  Body mass index is 24.26 kg/m.   Physical Exam: General exam: Pleasant, elderly Caucasian male.  Cheerful.  Not in distress Skin: No rashes, lesions or ulcers. HEENT: Atraumatic, normocephalic, no obvious bleeding Lungs: Clear to auscultation bilaterally CVS: Regular rate and rhythm, no murmur GI/Abd soft, nontender, nondistended, bowel sound present CNS: Alert, awake, oriented to place and person Psychiatry: Mood appropriate.  Cheerfully confused. Extremities: No pedal  edema, no  calf tenderness.  Right foot ulcer s/p surgery.  Data Review: I have personally reviewed the laboratory data and studies available.  F/u labs ordered Unresulted Labs (From admission, onward)     Start     Ordered   05/25/22 1657  Aerobic/Anaerobic Culture w Gram Stain (surgical/deep wound)  RELEASE UPON ORDERING,   TIMED       Comments: Specimen A: Pre-op diagnosis: right foot infection    05/25/22 1657            Total time spent in review of labs and imaging, patient evaluation, formulation of plan, documentation and communication with family: 22 minutes  Signed, Terrilee Croak, MD [facility] 05/26/2022

## 2022-05-26 NOTE — Progress Notes (Signed)
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 I&D on 2/27. PMH signficant for chronic HF, CKD IIIa, CAD, DM, encephalopathy, PAD, peripheral neuropathy, SDH, severe pulmonary HTN.    PT Comments    PT performs re-evaluation as podiatry changed WB status to heel WB for transfers only, otherwise NWB, in 05/25/2022 op note. Pt has considerable difficulty ascending into standing due to LE weakness and requires physical assistance and elevation of bed to successfully stand at this time. Pt does appear to provide good effort to maintain heel WB, however will benefit from reinforcement of WB restrictions due to cognitive deficits. Pt will benefit from continued acute PT services in an effort to improve transfer quality and to initiate wheelchair training. PT recommends SNF placement as pt no reliable caregiver support at this time.  Recommendations for follow up therapy are one component of a multi-disciplinary discharge planning process, led by the attending physician.  Recommendations may be updated based on patient status, additional functional criteria and insurance authorization.  Follow Up Recommendations  Skilled nursing-short term rehab (<3 hours/day) Can patient physically be transported by private vehicle: No   Assistance Recommended at Discharge Frequent or constant Supervision/Assistance  Patient can return home with the following A lot of help with walking and/or transfers;A lot of help with bathing/dressing/bathroom;Assistance with cooking/housework;Assist for transportation;Help with stairs or ramp for entrance   Equipment Recommendations  Wheelchair (measurements PT);Wheelchair cushion (measurements PT);BSC/3in1    Recommendations for Other Services       Precautions /  Restrictions Precautions Precautions: Fall Required Braces or Orthoses: Other Brace Other Brace: Post-op shoe Restrictions Weight Bearing Restrictions: Yes RLE Weight Bearing: Non weight bearing (or heel WB for transfers only per podiatry op note on 2/27)     Mobility  Bed Mobility Overal bed mobility: Needs Assistance Bed Mobility: Rolling, Sidelying to Sit, Sit to Supine Rolling: Min assist Sidelying to sit: Min assist, HOB elevated   Sit to supine: Min assist        Transfers Overall transfer level: Needs assistance Equipment used: Rolling walker (2 wheels) Transfers: Sit to/from Stand Sit to Stand: Mod assist, From elevated surface           General transfer comment: verbal cues for heel WB during transfer, pt requires assistance to power up into standing, does appear to provide goof effort to maintain forefoot off ground. Successfully able to stand on 2nd attempt, unsuccessful 1st attempt    Ambulation/Gait Ambulation/Gait assistance:  (deferred 2/2 difficulty with transfer)                 Stairs             Wheelchair Mobility    Modified Rankin (Stroke Patients Only)       Balance Overall balance assessment: Needs assistance Sitting-balance support: No upper extremity supported, Feet supported Sitting balance-Leahy Scale: Good     Standing balance support: Bilateral upper extremity supported, Reliant on assistive device for balance Standing balance-Leahy Scale: Poor                              Cognition Arousal/Alertness: Awake/alert Behavior During Therapy: WFL for tasks assessed/performed Overall Cognitive Status: Impaired/Different from baseline Area of Impairment: Orientation, Attention, Memory, Following commands, Safety/judgement, Awareness, Problem  solving                 Orientation Level: Disoriented to, Time Current Attention Level: Sustained Memory: Decreased recall of precautions, Decreased  short-term memory Following Commands: Follows one step commands consistently, Follows multi-step commands inconsistently Safety/Judgement: Decreased awareness of safety, Decreased awareness of deficits Awareness: Emergent Problem Solving: Slow processing, Requires verbal cues          Exercises      General Comments General comments (skin integrity, edema, etc.): VSS on RA      Pertinent Vitals/Pain Pain Assessment Pain Assessment: Faces Faces Pain Scale: Hurts even more Pain Location: R foot Pain Descriptors / Indicators: Sore Pain Intervention(s): Monitored during session    Home Living                          Prior Function            PT Goals (current goals can now be found in the care plan section) Acute Rehab PT Goals Patient Stated Goal: to see his dog PT Goal Formulation: With patient Time For Goal Achievement: 06/09/22 Potential to Achieve Goals: Fair Progress towards PT goals: Progressing toward goals    Frequency    Min 3X/week      PT Plan Frequency needs to be updated;Discharge plan needs to be updated    Co-evaluation              AM-PAC PT "6 Clicks" Mobility   Outcome Measure  Help needed turning from your back to your side while in a flat bed without using bedrails?: A Little Help needed moving from lying on your back to sitting on the side of a flat bed without using bedrails?: A Little Help needed moving to and from a bed to a chair (including a wheelchair)?: Total Help needed standing up from a chair using your arms (e.g., wheelchair or bedside chair)?: A Lot Help needed to walk in hospital room?: Total Help needed climbing 3-5 steps with a railing? : Total 6 Click Score: 11    End of Session Equipment Utilized During Treatment: Gait belt Activity Tolerance: Patient tolerated treatment well Patient left: in bed;with call bell/phone within reach;with bed alarm set Nurse Communication: Mobility status;Weight bearing  status;Precautions PT Visit Diagnosis: Unsteadiness on feet (R26.81);Muscle weakness (generalized) (M62.81);Difficulty in walking, not elsewhere classified (R26.2)     Time: [id number] PT Time Calculation (min) (ACUTE ONLY): 20 min  Charges:  Lamar, PT, DPT Acute Rehabilitation Office [place] Dominie Benedick 05/26/2022, 9:41 AM

## 2022-05-26 NOTE — Anesthesia Postprocedure Evaluation (Signed)
Anesthesia Post Note  Patient: Wayne [name]  Procedure(s) Performed: IRRIGATION AND DEBRIDEMENT FOOT (Right)     Patient location during evaluation: PACU Anesthesia Type: MAC Level of consciousness: awake and alert Pain management: pain level controlled Vital Signs Assessment: post-procedure vital signs reviewed and stable Respiratory status: spontaneous breathing, nonlabored ventilation, respiratory function stable and patient connected to nasal cannula oxygen Cardiovascular status: stable and blood pressure returned to baseline Postop Assessment: no apparent nausea or vomiting Anesthetic complications: no   No notable events documented.  Last Vitals:  Vitals:   05/26/22 1637 05/26/22 1932  BP: (!) 115/53 (!) 159/65  Pulse: 66 68  Resp:  16  Temp: 36.8 C 36.6 C  SpO2: 91% 93%    Last Pain:  Vitals:   05/26/22 1932  TempSrc: Oral  PainSc:                  Santa Lighter

## 2022-05-26 NOTE — Progress Notes (Signed)
   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

## 2022-05-27 ENCOUNTER — Encounter (HOSPITAL_COMMUNITY): Payer: Self-pay | Admitting: Podiatry

## 2022-05-27 LAB — GLUCOSE, CAPILLARY
Glucose-Capillary: 154 mg/dL — ABNORMAL HIGH (ref 70–99)
Glucose-Capillary: 132 mg/dL — ABNORMAL HIGH (ref 70–99)
Glucose-Capillary: 155 mg/dL — ABNORMAL HIGH (ref 70–99)
Glucose-Capillary: 128 mg/dL — ABNORMAL HIGH (ref 70–99)

## 2022-05-27 LAB — SURGICAL PATHOLOGY

## 2022-05-27 MED ORDER — AMOXICILLIN-POT CLAVULANATE 875-125 MG PO TABS: 1.0000 | ORAL_TABLET | Freq: Two times a day (BID) | ORAL | 0 refills | Status: DC

## 2022-05-27 MED ORDER — CIPROFLOXACIN HCL 750 MG PO TABS
750.0000 mg | ORAL_TABLET | Freq: Two times a day (BID) | ORAL | Status: DC
  Administered 2022-05-27 – 2022-05-29 (×4): 750 mg via ORAL
  Filled 2022-05-27 (×4): qty 1

## 2022-05-27 MED ORDER — SENNOSIDES-DOCUSATE SODIUM 8.6-50 MG PO TABS: 1.0000 | ORAL_TABLET | Freq: Two times a day (BID) | ORAL | 0 refills | Status: AC

## 2022-05-27 MED ORDER — OXYCODONE HCL 5 MG PO TABS: 5.0000 mg | ORAL_TABLET | Freq: Four times a day (QID) | ORAL | 0 refills | Status: AC | PRN

## 2022-05-27 MED ORDER — TAMSULOSIN HCL 0.4 MG PO CAPS: 0.4000 mg | ORAL_CAPSULE | Freq: Every day | ORAL | 2 refills | Status: DC

## 2022-05-27 MED ORDER — AMOXICILLIN-POT CLAVULANATE 875-125 MG PO TABS
1.0000 | ORAL_TABLET | Freq: Two times a day (BID) | ORAL | Status: DC
  Administered 2022-05-27 – 2022-05-29 (×4): 1 via ORAL
  Filled 2022-05-27 (×4): qty 1

## 2022-05-27 MED ORDER — SACCHAROMYCES BOULARDII 250 MG PO CAPS: 250.0000 mg | ORAL_CAPSULE | Freq: Two times a day (BID) | ORAL | 0 refills | Status: AC

## 2022-05-27 MED ORDER — ALPRAZOLAM 0.25 MG PO TABS: 0.2500 mg | ORAL_TABLET | Freq: Two times a day (BID) | ORAL | 0 refills | Status: AC | PRN

## 2022-05-27 MED ORDER — CIPROFLOXACIN HCL 750 MG PO TABS: 750.0000 mg | ORAL_TABLET | Freq: Two times a day (BID) | ORAL | 0 refills | Status: DC

## 2022-05-27 MED ORDER — ENSURE ENLIVE PO LIQD: 237.0000 mL | Freq: Two times a day (BID) | ORAL | 12 refills | Status: DC

## 2022-05-27 MED ORDER — POLYETHYLENE GLYCOL 3350 17 G PO PACK: 17.0000 g | PACK | Freq: Every day | ORAL | 0 refills | Status: AC | PRN

## 2022-05-27 NOTE — Progress Notes (Signed)
[name]  Date of Admission:  05/20/2022      Total days of antibiotics 7   Zosyn 2/26 >c          ASSESSMENT: Wayne [name] is a 82 y.o. 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 Most recent OR culture from bone 2/27 no growth 24h, negative gram stain, no WBC with pathology suggesting bony regeneration and no acute OM noted. His daughter will be assisting with his care and planning hospital bed delivery at home. She lives local and helps with his transportation.  With adequate surgical source control and sensitive organisms, will plan to convert to bioavailable oral regimen with Ciprofloxacin + Augmentin taken together BID to cover pathogens recovered. Rare diphtheroides likely more indicative of non-pathogenic skin colonizer.  Arrange for 4 weeks and FU with ID team to ensure no further abx needed.  Pre tx ESR 72, CRP 18.2   Discussed proper administration of antibiotics, avoid dairy/MVI while taking them. Call to report any significant GI side effects (watery stools > 3 in 24h or inability to hold down food/fluids).    ID will sign off - please call back with any questions/concerns or if we can be of further assistance.    PLAN: Plan to discharge with 4 weeks Ciprofloxacin and Augmentin BID  FU with Dr. Juleen China 3/26 @ 10:30 am arranged.       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 ([place])   CAD S/P percutaneous coronary angioplasty   PAD (peripheral artery disease) (HCC)   Chronic diastolic CHF (congestive heart failure) (HCC)   Acute renal failure superimposed on stage 3a chronic kidney disease (HCC)   Malnutrition of moderate degree    acetaminophen  1,000 mg Oral TID   ascorbic acid  500 mg Oral Daily   dapagliflozin propanediol  10 mg Oral Daily   feeding supplement  237 mL Oral BID BM    heparin  5,000 Units Subcutaneous Q8H   insulin aspart  0-5 Units Subcutaneous QHS   insulin aspart  0-6 Units Subcutaneous TID WC   melatonin  3 mg Oral QHS   metoprolol succinate  12.5 mg Oral Daily   multivitamin with minerals  1 tablet Oral Daily   polyethylene glycol  17 g Oral Daily   senna-docusate  1 tablet Oral QHS   tamsulosin  0.4 mg Oral QPC breakfast   zinc sulfate  220 mg Oral Daily    SUBJECTIVE: Feeling OK today. Ready to get up and start his day.  No new complaints.     Review of Systems: Review of Systems  Constitutional:  Negative for chills and fever.  Respiratory: Negative.    Cardiovascular: Negative.   Gastrointestinal:  Negative for abdominal pain, nausea and vomiting.  Musculoskeletal:  Negative for joint pain.  Skin:  Negative for itching and rash.      No Known Allergies  OBJECTIVE: Vitals:   05/26/22 1932 05/27/22 0321 05/27/22 0400 05/27/22 0824  BP: (!) 159/65 (!) 169/73 (!) 154/74 (!) 125/49  Pulse: 68 76 71 75  Resp: 16 18    Temp: 97.9 F (36.6 C) 97.6 F (36.4 C)  97.6 F (36.4 C)  TempSrc: Oral Oral  Oral  SpO2: 93% 93%  (!) 84%  Weight:      Height:  Body mass index is 24.26 kg/m.  Physical Exam Vitals reviewed.  Constitutional:      Appearance: Normal appearance.  Cardiovascular:     Rate and Rhythm: Normal rate.  Pulmonary:     Effort: Pulmonary effort is normal.     Breath sounds: Normal breath sounds.  Abdominal:     General: There is no distension.     Palpations: Abdomen is soft.     Tenderness: There is no abdominal tenderness.  Musculoskeletal:     Comments: R Foot wound wrapped in clean/dry dressing  Skin:    General: Skin is warm and dry.  Neurological:     Mental Status: He is alert and oriented to person, place, and time.     Lab Results Lab Results  Component Value Date   WBC 9.0 05/26/2022   HGB 12.5 (L) 05/26/2022   HCT 38.4 (L) 05/26/2022   MCV 89.5 05/26/2022   PLT 260  05/26/2022    Lab Results  Component Value Date   CREATININE 1.33 (H) 05/26/2022   BUN 29 (H) 05/26/2022   NA 138 05/26/2022   K 4.4 05/26/2022   CL 109 05/26/2022   CO2 16 (L) 05/26/2022    Lab Results  Component Value Date   ALT 13 05/20/2022   AST 15 05/20/2022   ALKPHOS 148 (H) 05/20/2022   BILITOT 1.0 05/20/2022     Microbiology: Recent Results (from the past 240 hour(s))  Culture, blood (Routine x 2)     Status: None   Collection Time: 05/20/22  8:13 PM   Specimen: BLOOD  Result Value Ref Range Status   Specimen Description BLOOD LEFT ANTECUBITAL  Final   Special Requests   Final    BOTTLES DRAWN AEROBIC AND ANAEROBIC Blood Culture adequate volume   Culture   Final    NO GROWTH 5 DAYS Performed at [facility], [address], [place], [place] [address]    Report Status 05/25/2022 FINAL  Final  Culture, blood (Routine x 2)     Status: None   Collection Time: 05/20/22  8:13 PM   Specimen: BLOOD  Result Value Ref Range Status   Specimen Description BLOOD BLOOD RIGHT ARM  Final   Special Requests   Final    BOTTLES DRAWN AEROBIC AND ANAEROBIC Blood Culture adequate volume   Culture   Final    NO GROWTH 5 DAYS Performed at [facility], [address] [place], [place] [address]    Report Status 05/25/2022 FINAL  Final  Aerobic/Anaerobic Culture w Gram Stain (surgical/deep wound)     Status: None (Preliminary result)   Collection Time: 05/23/22  8:05 AM   Specimen: PATH Soft tissue resection  Result Value Ref Range Status   Specimen Description TISSUE  Final   Special Requests RIGHT 5TH RAY MPJ  Final   Gram Stain   Final    NO WBC SEEN FEW GRAM POSITIVE COCCI IN PAIRS RARE GRAM NEGATIVE RODS Performed at [facility], [place] [address], [place], [place] [address]    Culture   Final    ABUNDANT ENTEROCOCCUS FAECALIS RARE PSEUDOMONAS AERUGINOSA RARE DIPHTHEROIDS(CORYNEBACTERIUM SPECIES) Standardized susceptibility testing for this organism is not  available. NO ANAEROBES ISOLATED; CULTURE IN PROGRESS FOR 5 DAYS    Report Status PENDING  Incomplete   Organism ID, Bacteria ENTEROCOCCUS FAECALIS  Final   Organism ID, Bacteria PSEUDOMONAS AERUGINOSA  Final      Susceptibility   Enterococcus faecalis - MIC*  AMPICILLIN <=2 SENSITIVE Sensitive     VANCOMYCIN 1 SENSITIVE Sensitive     GENTAMICIN SYNERGY SENSITIVE Sensitive     * ABUNDANT ENTEROCOCCUS FAECALIS   Pseudomonas aeruginosa - MIC*    CEFTAZIDIME 4 SENSITIVE Sensitive     CIPROFLOXACIN <=0.25 SENSITIVE Sensitive     GENTAMICIN <=1 SENSITIVE Sensitive     IMIPENEM 2 SENSITIVE Sensitive     PIP/TAZO <=4 SENSITIVE Sensitive     CEFEPIME 2 SENSITIVE Sensitive     * RARE PSEUDOMONAS AERUGINOSA  Aerobic/Anaerobic Culture w Gram Stain (surgical/deep wound)     Status: None   Collection Time: 05/23/22  8:06 AM   Specimen: PATH Other; Tissue  Result Value Ref Range Status   Specimen Description WOUND  Final   Special Requests RIGHT 5TH RAY MPJ  Final   Gram Stain   Final    NO WBC SEEN MODERATE GRAM POSITIVE COCCI IN PAIRS    Culture   Final    ABUNDANT ENTEROCOCCUS FAECALIS SUSCEPTIBILITIES PERFORMED ON PREVIOUS CULTURE WITHIN THE LAST 5 DAYS. FEW DIPHTHEROIDS(CORYNEBACTERIUM SPECIES) Standardized susceptibility testing for this organism is not available. RARE PREVOTELLA SPECIES BETA LACTAMASE POSITIVE Performed at [facility], [place] [address], [place], [place] [address]    Report Status 05/26/2022 FINAL  Final  Aerobic/Anaerobic Culture w Gram Stain (surgical/deep wound)     Status: None (Preliminary result)   Collection Time: 05/25/22  4:55 PM   Specimen: PATH Bone resection; Tissue  Result Value Ref Range Status   Specimen Description BONE  Final   Special Requests  5TH METATARSAL  Final   Gram Stain NO WBC SEEN NO ORGANISMS SEEN   Final   Culture   Final    NO GROWTH < 24 HOURS Performed at [facility], [place] [address], [place], [place]  [address]    Report Status PENDING  Incomplete     Janene Madeira, MSN, NP-[facility] [email or web address] Pager: [phone] Office: [phone] [facility] Main Line: [place] Communication Welcome

## 2022-05-27 NOTE — Discharge Summary (Addendum)
Physician Discharge Summary  Wayne [name] [id number] DOB: 1940-12-21 DOA: 05/20/2022  PCP: Baruch Gouty, FNP  Admit date: 05/20/2022 Discharge date: 05/27/2022  Admitted From: Home Discharge disposition: Home with home health  Recommendations at discharge:  Complete the course of antibiotics per ID. Follow-up with ID and podiatry as an outpatient.   Brief narrative: Wayne [name] is a 82 y.o. male with PMH significant for DM2, CAD/stents, PAD, severe pulmonary hypertension, CHF, subdural hematoma, peripheral neuropathy  Patient has PAD and was seen by vascular surgery in September 2023.  He had angiography performed but did not have any options for reconstruction.  He had a foot ulcer overlying the right fifth MTP joint for close to 2 months, followed by podiatry. 2/6, he was seen by podiatrist Dr. Posey Pronto and to have right submetatarsal 5 ulceration with fat layer exposed.  2/22, patient was brought to the ED for worsening wound, weakness, confusion, fatigue, fever of 101.  In the ED, afebrile, hemodynamically stable.  WBC count elevated 10.6. X-ray right foot are concerning for ulceration over the fifth MTP with underlying osteomyelitis. Blood cultures were collected in the ED Started on IV cefepime and IV vancomycin Admitted to [place] consult obtained. 2/25, underwent partial fifth ray amputation by Dr. Loel Lofty  Subjective: Patient was seen and examined this morning.  Lying on bed.  Not in distress.  Pleasantly demented.  Oriented to place only.  No family at bedside.   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   2/25, underwent right partial fifth ray amputation.  Per podiatrist, patient had extensive necrosis of the plantar compartment.   2/27, underwent repeat I&D of right foot.  Follow-up note, extensive  necrotic tissue was present.  May need further I&D/closure in the OR.  Defer to podiatry. Currently improving on IV Rocephin, IV vancomycin and oral Flagyl. WBC count improved.  No fever. Infectious disease recommended at discharge: 4 weeks of ciprofloxacin and Augmentin twice daily. To follow-up with podiatry and ID as an outpatient. Continue pain management with as needed meds. Recent Labs  Lab 05/20/22 2013 05/21/22 0302 05/22/22 1647 05/23/22 0112 05/24/22 0256 05/25/22 0048 05/26/22 0032  WBC 10.6*   < > 10.8* 10.2 7.8 8.6 9.0  LATICACIDVEN 1.0  --   --   --   --   --   --    < > = values in this interval not displayed.   Acute metabolic encephalopathy Patient remains pleasantly confused in the hospital.  At times not oriented to situation.  Not restless or agitated this morning. Nightly Xanax as needed.  Also encouraged over-the-counter melatonin.   AKI on CKD stage IIIa b/l creat 1.3 in 01/2022. Presented with creatinine elevated to 1.69.  Creatinine gradually improved with IV hydration.   Recent Labs    12/04/21 0513 02/16/22 1546 05/20/22 2013 05/21/22 0302 05/22/22 0149 05/22/22 1647 05/23/22 0112 05/24/22 0256 05/25/22 0048 05/26/22 0032  BUN 34* 48* 38* 34* 32* 39* 44* 41* 37* 29*  CREATININE 1.17 1.39* 1.69* 1.49* 1.68* 1.47* 1.61* 1.39* 1.18 [id number]*   Acute metabolic encephalopathy Acute delirium Patient had intermittent episodes of confusion and restlessness.  Continue to maintain circadian rhythm.  Continue melatonin at bedtime ordered if needed. Haldol low-dose as needed if needed.  EKG from 2/22 with QTc 464 ms.  Type 2 diabetes mellitus A1c 5.9 in 05/20/2022 PTA on  Farxiga 10 mg daily, glipizide 5 mg daily Continue the same. Recent Labs  Lab 05/26/22 1223 05/26/22 1706 05/26/22 2119 05/27/22 0823 05/27/22 1202  GLUCAP 166* 187* 153* 154* 132*    Chronic diastolic CHF  Clinically euvolemic.   Recent echo with EF 50-55% and G2 DD PTA on  Toprol 12.5 mg twice daily, Farxiga 10 g daily, Lasix 40 mg daily. Reportedly not taking Entresto. Continue the same at home.  Peripheral artery disease On chart review it was noted that patient was last seen by vascular surgeon Dr. Stanford Breed in September 2023.  Patient had an angiogram done in August which showed peroneal only runoff below the knee, with arborization to the posterior tibial artery.  Patient has been optimized from a vascular standpoint. Not sure why he was not on any aspirin or statin as an outpatient.  CAD/stents Patient has not had any chest pain, shortness of breath or any anginal symptoms.  Continue beta-blocker. Not sure why he was not on any aspirin or statin as an outpatient. Follow-up as an outpatient  Nocturia May have underlying BPH. Has not been seen by urologist as an outpatient. We have started him on Flomax daily.  Impaired mobility Secondary to diabetes, advanced age, recent surgery. PT eval obtained.  Home health PT recommended.   Goals of care Patient is DNR at this time.  He came in as DNR.  Daughter wanted to switch him to full code only for surgery.  At discharge, she wants him to switch back to DNR.  She also stated that it was hard to bring him to the hospital.  He would would never want to come back to the hospital again.  Will provide her with hospice information at discharge.    Code Status: DNR.  Wounds:  - Wound / Incision (Open or Dehisced) 11/11/21 Other (Comment) Foot Anterior;Right Open wound to second metatarsal (Active)  Date First Assessed/Time First Assessed: 11/11/21 2010   Wound Type: Other (Comment)  Location: Foot  Location Orientation: Anterior;Right  Wound Description (Comments): Open wound to second metatarsal  Present on Admission: Yes    Assessments 11/11/2021  8:10 PM 12/02/2021 10:00 AM  Dressing Type None --  Site / Wound Assessment Dry;Purple;Pink --  Peri-wound Assessment Erythema (non-blanchable) --  Wound Length (cm) --  2 cm  Wound Width (cm) -- 2 cm  Wound Depth (cm) -- 1 cm  Wound Volume (cm^3) -- 4 cm^3  Wound Surface Area (cm^2) -- 4 cm^2  Margins Unattached edges (unapproximated) --  Closure None --  Drainage Amount Scant --  Drainage Description Serosanguineous --     No associated orders.     Wound / Incision (Open or Dehisced) 11/28/21 Skin tear Hand Right (Active)  Date First Assessed/Time First Assessed: 11/28/21 1833   Wound Type: Skin tear  Location: Hand  Location Orientation: Right  Present on Admission: Yes    Assessments 11/28/2021  6:29 PM 12/03/2021  9:10 PM  Dressing Type Foam - Lift dressing to assess site every shift Foam - Lift dressing to assess site every shift  Dressing Changed New --  Dressing Status Clean, Dry, Intact Clean, Dry, Intact  Dressing Change Frequency PRN Every 3 days  Site / Wound Assessment Bleeding;Red --  Treatment Cleansed --     No associated orders.     Wound / Incision (Open or Dehisced) 05/21/22 Other (Comment) Other (Comment) Right (Active)  Date First Assessed/Time First Assessed: 05/21/22 2030   Wound Type: (c) Other (Comment)  Location: (c) Other (Comment)  Location Orientation: Right    Assessments 05/22/2022 12:00 AM 05/26/2022  7:33 PM  Dressing Type None --  Dressing Status -- Clean, Dry, Intact  Site / Wound Assessment Black --  Margins Attached edges (approximated) --  Drainage Amount None None     No associated orders.     Incision (Closed) 05/23/22 Foot Right (Active)  Date First Assessed/Time First Assessed: 05/23/22 0825   Location: Foot  Location Orientation: Right    Assessments 05/23/2022  8:30 AM 05/27/2022  9:55 AM  Dressing Type Compression wrap Compression wrap  Dressing Clean, Dry, Intact Clean, Dry, Intact  Site / Wound Assessment Dressing in place / Unable to assess Clean;Dry;Dressing in place / Unable to assess  Drainage Amount None --     No associated orders.     Incision (Closed) 05/25/22 Foot Right (Active)  Date  First Assessed/Time First Assessed: 05/25/22 1727   Location: Foot  Location Orientation: Right    Assessments 05/25/2022  5:45 PM 05/27/2022  9:55 AM  Dressing Type Compression wrap Compression wrap  Dressing Clean, Dry, Intact Clean, Dry, Intact  Site / Wound Assessment Clean;Dry Dressing in place / Unable to assess  Drainage Amount None --     No associated orders.    Discharge Exam:   Vitals:   05/26/22 1932 05/27/22 0321 05/27/22 0400 05/27/22 0824  BP: (!) 159/65 (!) 169/73 (!) 154/74 (!) 125/49  Pulse: 68 76 71 75  Resp: 16 18    Temp: 97.9 F (36.6 C) 97.6 F (36.4 C)  97.6 F (36.4 C)  TempSrc: Oral Oral  Oral  SpO2: 93% 93%  (!) 84%  Weight:      Height:        Body mass index is 24.26 kg/m.   General exam: Pleasant, elderly Caucasian male.  Cheerful.  Not in distress Skin: No rashes, lesions or ulcers. HEENT: Atraumatic, normocephalic, no obvious bleeding Lungs: Clear to auscultation bilaterally CVS: Regular rate and rhythm, no murmur GI/Abd soft, nontender, nondistended, bowel sound present CNS: Alert, awake, oriented to place and person Psychiatry: Mood appropriate.  Cheerfully confused. Extremities: No pedal edema, no calf tenderness.  Right foot ulcer s/p surgery.  Follow ups:    Follow-up Information     [facility] Follow up.   Specialty: Home Health Services Why: Home health has been arranged. They will contact you to schedule apt in 1 to 2 days post discharge. Contact information: [address] [place] [address] [phone]         [facility] Follow up.   Why: [employer] will be providing DME supplies for the home including a hospital bed.  The hospital bed will be delivered to the home on 05/27/2022. Contact information: [place] [address] [phone]         Baruch Gouty, FNP Follow up.   Specialty: Family Medicine Contact information: [place]  [address] [phone]                 Discharge Instructions:   Discharge Instructions     Call MD for:  difficulty breathing, headache or visual disturbances   Complete by: As directed    Call MD for:  extreme fatigue   Complete by: As directed    Call MD for:  hives   Complete by: As directed    Call MD for:  persistant dizziness or light-headedness  Complete by: As directed    Call MD for:  persistant nausea and vomiting   Complete by: As directed    Call MD for:  severe uncontrolled pain   Complete by: As directed    Call MD for:  temperature >100.4   Complete by: As directed    Diet general   Complete by: As directed    Discharge instructions   Complete by: As directed    Recommendations at discharge:   Complete the course of antibiotics per ID.  Follow-up with ID and podiatry as an outpatient.  General discharge instructions: Follow with Primary MD Thayer Ohm Connye Burkitt, FNP in 7 days  Please request your PCP  to go over your hospital tests, procedures, radiology results at the follow up. Please get your medicines reviewed and adjusted.  Your PCP may decide to repeat certain labs or tests as needed. Do not drive, operate heavy machinery, perform activities at heights, swimming or participation in water activities or provide baby sitting services if your were admitted for syncope or siezures until you have seen by Primary MD or a Neurologist and advised to do so again. [place] Controlled Substance Reporting System database was reviewed. Do not drive, operate heavy machinery, perform activities at heights, swim, participate in water activities or provide baby-sitting services while on medications for pain, sleep and mood until your outpatient physician has reevaluated you and advised to do so again.  You are strongly recommended to comply with the dose, frequency and duration of prescribed medications. Activity: As tolerated with Full fall precautions use walker/cane &  assistance as needed Avoid using any recreational substances like cigarette, tobacco, alcohol, or non-prescribed drug. If you experience worsening of your admission symptoms, develop shortness of breath, life threatening emergency, suicidal or homicidal thoughts you must seek medical attention immediately by calling 911 or calling your MD immediately  if symptoms less severe. You must read complete instructions/literature along with all the possible adverse reactions/side effects for all the medicines you take and that have been prescribed to you. Take any new medicine only after you have completely understood and accepted all the possible adverse reactions/side effects.  Wear Seat belts while driving. You were cared for by a hospitalist during your hospital stay. If you have any questions about your discharge medications or the care you received while you were in the hospital after you are discharged, you can call the unit and ask to speak with the hospitalist or the covering physician. Once you are discharged, your primary care physician will handle any further medical issues. Please note that NO REFILLS for any discharge medications will be authorized once you are discharged, as it is imperative that you return to your primary care physician (or establish a relationship with a primary care physician if you do not have one).   Discharge wound care:   Complete by: As directed    Increase activity slowly   Complete by: As directed        Discharge Medications:   Allergies as of 05/27/2022   No Known Allergies      Medication List     STOP taking these medications    sacubitril-valsartan 49-51 MG Commonly known as: ENTRESTO       TAKE these medications    Accu-Chek Softclix Lancets lancets Use 4 times daily as directed to check blood sugars.   acetaminophen 500 MG tablet Commonly known as: TYLENOL Take 2 tablets (1,000 mg total) by mouth every 8 (eight) hours.  ALPRAZolam 0.25  MG tablet Commonly known as: Xanax Take 1 tablet (0.25 mg total) by mouth 2 (two) times daily as needed for up to 7 days for anxiety.   amoxicillin-clavulanate 875-125 MG tablet Commonly known as: AUGMENTIN Take 1 tablet by mouth 2 (two) times daily for 26 days.   BIOTIN PLUS KERATIN PO Take 1 tablet by mouth daily.   blood glucose meter kit and supplies Kit Dispense based on patient and insurance preference. Use up to four times daily as directed.   Accu-Chek Guide w/Device Kit Use up to four times daily as directed.   ciclopirox 8 % solution Commonly known as: Penlac Apply topically at bedtime. Apply over nail and surrounding skin. Apply daily over previous coat. After seven (7) days, may remove with alcohol and continue cycle.   ciprofloxacin 750 MG tablet Commonly known as: CIPRO Take 1 tablet (750 mg total) by mouth 2 (two) times daily for 26 days.   dapagliflozin propanediol 10 MG Tabs tablet Commonly known as: FARXIGA Take 1 tablet (10 mg total) by mouth daily.   feeding supplement Liqd Take 237 mLs by mouth 2 (two) times daily between meals.   furosemide 40 MG tablet Commonly known as: LASIX Take 1 tablet (40 mg total) by mouth daily.   glipiZIDE 5 MG tablet Commonly known as: GLUCOTROL TAKE 1 TABLET BY MOUTH DAILY BEFORE BREAKFAST.   glucose blood test strip Use as instructed   Accu-Chek Guide test strip Generic drug: glucose blood Use 4 times daily as directed to check bloos sugars.   metoprolol succinate 25 MG 24 hr tablet Commonly known as: TOPROL-XL TAKE 1/2 TABLET BY MOUTH EVERY DAY What changed: when to take this   oxyCODONE 5 MG immediate release tablet Commonly known as: Oxy IR/ROXICODONE Take 1 tablet (5 mg total) by mouth every 6 (six) hours as needed for up to 7 days for moderate pain.   polyethylene glycol 17 g packet Commonly known as: MIRALAX / GLYCOLAX Take 17 g by mouth daily as needed for moderate constipation.   saccharomyces  boulardii 250 MG capsule Commonly known as: FLORASTOR Take 1 capsule (250 mg total) by mouth 2 (two) times daily for 28 days.   Santyl 250 UNIT/GM ointment Generic drug: collagenase Apply 1 Application topically daily.   senna-docusate 8.6-50 MG tablet Commonly known as: Senokot-S Take 1 tablet by mouth 2 (two) times daily.   tamsulosin 0.4 MG Caps capsule Commonly known as: FLOMAX Take 1 capsule (0.4 mg total) by mouth daily after breakfast. Start taking on: May 28, 2022   VITAMIN C PO Take 1 tablet by mouth daily.   Vitamin D (Ergocalciferol) 1.25 MG (50000 UNIT) Caps capsule Commonly known as: DRISDOL Take 1 capsule (50,000 Units total) by mouth every 7 (seven) days.   VITAMIN E PO Take 1 tablet by mouth daily.   ZINC PO Take 1 tablet by mouth daily.               Durable Medical Equipment  (From admission, onward)           Start     Ordered   05/26/22 1620  For home use only DME Hospital bed  Once       Question Answer Comment  Length of Need Lifetime   Patient has (list medical condition): T2DM, CAD, PAD, pulmonary hypertension, and chronic diastolic CHF with Right 5th mpj gas gangrene and osteomyelitis of the proximal phalanx 5th toe and 5th met head   The  above medical condition requires: Patient requires the ability to reposition frequently   Head must be elevated greater than: 30 degrees   Bed type Semi-electric   Support Surface: Gel Overlay      05/26/22 1626              Discharge Care Instructions  (From admission, onward)           Start     Ordered   05/27/22 0000  Discharge wound care:        05/27/22 1444             The results of significant diagnostics from this hospitalization (including imaging, microbiology, ancillary and laboratory) are listed below for reference.    Procedures and Diagnostic Studies:   DG Foot Complete Right  Result Date: 05/20/2022 CLINICAL DATA:  Diabetic foot ulcer, sepsis EXAM:  RIGHT FOOT COMPLETE - 3+ VIEW COMPARISON:  12/22/2021 FINDINGS: Frontal, oblique, and lateral views of the right foot are obtained. There is soft tissue ulceration overlying the fifth metatarsophalangeal joint, with extensive subcutaneous gas. There is osteopenia surrounding the fifth metatarsophalangeal joint, with erosive changes of the base of the fifth proximal phalanx consistent with osteomyelitis. There are no other acute bony abnormalities. Diffuse osteoarthritis. Marked soft tissue swelling throughout the foot, with extensive atherosclerosis again noted. IMPRESSION: 1. Soft tissue ulceration overlying the fifth metatarsophalangeal joint, with underlying osteopenia and bony erosions consistent with osteomyelitis. 2. Diffuse soft tissue swelling. Electronically Signed   By: Randa Ngo M.D.   On: 05/20/2022 19:52   DG Chest 2 View  Result Date: 05/20/2022 CLINICAL DATA:  Suspected sepsis.  Diabetic foot ulcer. EXAM: CHEST - 2 VIEW COMPARISON:  None 723 FINDINGS: Heart size and pulmonary vascularity are normal. Lungs are clear. No pleural effusions. No pneumothorax. Mediastinal contours appear intact. Calcification of the aorta. Degenerative changes in the spine and shoulders. Old left rib fractures. IMPRESSION: No active cardiopulmonary disease. Electronically Signed   By: Lucienne Capers M.D.   On: 05/20/2022 19:48     Labs:   Basic Metabolic Panel: Recent Labs  Lab 05/22/22 1647 05/23/22 0112 05/24/22 0256 05/25/22 0048 05/26/22 0032  NA 135 136 138 139 138  K 4.1 4.0 4.0 4.1 4.4  CL 103 105 106 111 109  CO2 21* 20* 21* 18* 16*  GLUCOSE 240* 242* 188* 149* 216*  BUN 39* 44* 41* 37* 29*  CREATININE 1.47* 1.61* 1.39* 1.18 1.33*  CALCIUM 8.4* 8.4* 8.5* 8.4* 8.3*   GFR Estimated Creatinine Clearance: 47.6 mL/min (A) (by C-G formula based on SCr of 1.33 mg/dL (H)). Liver Function Tests: Recent Labs  Lab 05/20/22 2013  AST 15  ALT 13  ALKPHOS 148*  BILITOT 1.0  PROT 6.9   ALBUMIN 3.1*   No results for input(s): "LIPASE", "AMYLASE" in the last 168 hours. No results for input(s): "AMMONIA" in the last 168 hours. Coagulation profile Recent Labs  Lab 05/20/22 2013  INR 1.2    CBC: Recent Labs  Lab 05/20/22 2013 05/21/22 0302 05/22/22 1647 05/23/22 0112 05/24/22 0256 05/25/22 0048 05/26/22 0032  WBC 10.6*   < > 10.8* 10.2 7.8 8.6 9.0  NEUTROABS 7.8*  --   --   --  5.4 6.1 6.5  HGB 12.5*   < > 12.6* 12.6* 12.2* 12.8* 12.5*  HCT 38.0*   < > 36.7* 37.1* 36.9* 38.9* 38.4*  MCV 88.6   < > 85.3 85.5 88.1 87.6 89.5  PLT 177   < >  194 208 199 180 260   < > = values in this interval not displayed.   Cardiac Enzymes: No results for input(s): "CKTOTAL", "CKMB", "CKMBINDEX", "TROPONINI" in the last 168 hours. BNP: Invalid input(s): "POCBNP" CBG: Recent Labs  Lab 05/26/22 1223 05/26/22 1706 05/26/22 2119 05/27/22 0823 05/27/22 1202  GLUCAP 166* 187* 153* 154* 132*   D-Dimer No results for input(s): "DDIMER" in the last 72 hours. Hgb A1c No results for input(s): "HGBA1C" in the last 72 hours. Lipid Profile No results for input(s): "CHOL", "HDL", "LDLCALC", "TRIG", "CHOLHDL", "LDLDIRECT" in the last 72 hours. Thyroid function studies No results for input(s): "TSH", "T4TOTAL", "T3FREE", "THYROIDAB" in the last 72 hours.  Invalid input(s): "FREET3" Anemia work up No results for input(s): "VITAMINB12", "FOLATE", "FERRITIN", "TIBC", "IRON", "RETICCTPCT" in the last 72 hours. Microbiology Recent Results (from the past 240 hour(s))  Culture, blood (Routine x 2)     Status: None   Collection Time: 05/20/22  8:13 PM   Specimen: BLOOD  Result Value Ref Range Status   Specimen Description BLOOD LEFT ANTECUBITAL  Final   Special Requests   Final    BOTTLES DRAWN AEROBIC AND ANAEROBIC Blood Culture adequate volume   Culture   Final    NO GROWTH 5 DAYS Performed at [facility], [address], [place], [place] [address]    Report Status 05/25/2022  FINAL  Final  Culture, blood (Routine x 2)     Status: None   Collection Time: 05/20/22  8:13 PM   Specimen: BLOOD  Result Value Ref Range Status   Specimen Description BLOOD BLOOD RIGHT ARM  Final   Special Requests   Final    BOTTLES DRAWN AEROBIC AND ANAEROBIC Blood Culture adequate volume   Culture   Final    NO GROWTH 5 DAYS Performed at [facility], [address], [place], [place] [address]    Report Status 05/25/2022 FINAL  Final  Aerobic/Anaerobic Culture w Gram Stain (surgical/deep wound)     Status: None (Preliminary result)   Collection Time: 05/23/22  8:05 AM   Specimen: PATH Soft tissue resection  Result Value Ref Range Status   Specimen Description TISSUE  Final   Special Requests RIGHT 5TH RAY MPJ  Final   Gram Stain   Final    NO WBC SEEN FEW GRAM POSITIVE COCCI IN PAIRS RARE GRAM NEGATIVE RODS Performed at [facility], [place] [address], , [place] [address]    Culture   Final    ABUNDANT ENTEROCOCCUS FAECALIS RARE PSEUDOMONAS AERUGINOSA RARE DIPHTHEROIDS(CORYNEBACTERIUM SPECIES) Standardized susceptibility testing for this organism is not available. NO ANAEROBES ISOLATED; CULTURE IN PROGRESS FOR 5 DAYS    Report Status PENDING  Incomplete   Organism ID, Bacteria ENTEROCOCCUS FAECALIS  Final   Organism ID, Bacteria PSEUDOMONAS AERUGINOSA  Final      Susceptibility   Enterococcus faecalis - MIC*    AMPICILLIN <=2 SENSITIVE Sensitive     VANCOMYCIN 1 SENSITIVE Sensitive     GENTAMICIN SYNERGY SENSITIVE Sensitive     * ABUNDANT ENTEROCOCCUS FAECALIS   Pseudomonas aeruginosa - MIC*    CEFTAZIDIME 4 SENSITIVE Sensitive     CIPROFLOXACIN <=0.25 SENSITIVE Sensitive     GENTAMICIN <=1 SENSITIVE Sensitive     IMIPENEM 2 SENSITIVE Sensitive     PIP/TAZO <=4 SENSITIVE Sensitive     CEFEPIME 2 SENSITIVE Sensitive     * RARE PSEUDOMONAS AERUGINOSA  Aerobic/Anaerobic Culture w Gram Stain (surgical/deep wound)  Status: None   Collection Time: 05/23/22   8:06 AM   Specimen: PATH Other; Tissue  Result Value Ref Range Status   Specimen Description WOUND  Final   Special Requests RIGHT 5TH RAY MPJ  Final   Gram Stain   Final    NO WBC SEEN MODERATE GRAM POSITIVE COCCI IN PAIRS    Culture   Final    ABUNDANT ENTEROCOCCUS FAECALIS SUSCEPTIBILITIES PERFORMED ON PREVIOUS CULTURE WITHIN THE LAST 5 DAYS. FEW DIPHTHEROIDS(CORYNEBACTERIUM SPECIES) Standardized susceptibility testing for this organism is not available. RARE PREVOTELLA SPECIES BETA LACTAMASE POSITIVE Performed at [facility], [place] [address] [place], [place] [address]    Report Status 05/26/2022 FINAL  Final  Aerobic/Anaerobic Culture w Gram Stain (surgical/deep wound)     Status: None (Preliminary result)   Collection Time: 05/25/22  4:55 PM   Specimen: PATH Bone resection; Tissue  Result Value Ref Range Status   Specimen Description BONE  Final   Special Requests  5TH METATARSAL  Final   Gram Stain NO WBC SEEN NO ORGANISMS SEEN   Final   Culture   Final    NO GROWTH < 24 HOURS Performed at [facility], [place] [address] [place], [place] [address]    Report Status PENDING  Incomplete    Time coordinating discharge: 35 minutes  Signed: Binaya Dahal  [facility] 05/27/2022, 3:01 PM         Please note the official discharge date will be 05/29/2022 as the pt had pulmonary edema on 05/28/2022 requiring IV lasix administration.

## 2022-05-27 NOTE — Progress Notes (Signed)
  Subjective:  Patient ID: Wayne [name], male    DOB: 02-07-1941,  MRN: [id number]  POD #2 delayed primary closure and graft application with debridement  Negative for chest pain and shortness of breath Fever: no Night sweats: no Objective:   Vitals:   05/27/22 1732 05/27/22 1943  BP: (!) 165/72 (!) 155/63  Pulse: 73 77  Resp:  18  Temp: 98.5 F (36.9 C) 98 F (36.7 C)  SpO2: 94% 93%   General AA&O x3. Normal mood and affect.  Vascular Foot remains warm and well-perfused at surgical site  Neurologic Epicritic sensation grossly reduced.  Dermatologic Incision healing well small area of ecchymosis, no signs of infection graft has good take  Orthopedic: MMT 5/5 in dorsiflexion, plantarflexion, inversion, and eversion. Normal joint ROM without pain or crepitus.    Assessment & Plan:  Patient was evaluated and treated and all questions answered.  POD #2 partial ray closure, graft application -Healing well no clinical signs of infection currently -WBAT to heel in postop shoe -Change dressing every other day at home with saline wet-to-dry dressing over the Adaptic, may also use surgical lube or hydrogel directly to graft -Follow-up with [place] in 2 weeks in office with Dr. Elpidio Anis, DPM  Accessible via secure chat for questions or concerns.

## 2022-05-27 NOTE — TOC Progression Note (Addendum)
Transition of Care [facility]) - Progression Note    Patient Details  Name: Wayne [name] MRN: [id number] Date of Birth: 1940-10-10  [facility] CM/SW [place], RN Phone Number: 05/27/2022, 11:45 AM  Clinical Narrative:    CM spoke with MSW this morning and the daughter called and expressed that she has not spoke with [employer] about delivery of the hospital bed.  I called [employer] and she states that she has arranged for delivery of the hospital bed to the home today and she will call the daughter now by phone to speak with her about the delivery today.  Patient will need PTAR to home and MSW is following to arrange once patient is medically stable - like tomorrow since bed is arriving today.  [place] agency met with the patient at the bedside for an assessment today.  CM called and spoke with Carolynn Sayers, CM with [employer] and she states that she spoke with ID MD and the plan is still transition to PO antibiotics.  05/27/2022 1614 - CM spoke with the attending MD and the patient  lives alone and will not have anyone at the home until 12 noon tomorrow to provide 24 hours support in the home.  Discharge to the home will be set up for 12 noon tomorrow when the [facility] private staff hired by the daughter will arrive to the home to take care of the patient.  The patient's hospital bed was delivered to the home last this afternoon by [employer].  PTAR was set up for 12 noon tomorrow for transport to the home.  I will follow up in the am as well with the family and Alvis Lemmings.  I spoke with the daughter and the daughter expressed that the patient states that no matter what happened to him medically after this admission to the hospital he would not seek treatment and would be interested in hospice care services.  I offered referral for hospice care services to the daughter and she did not have a preference for companies.  [facility] referral was placed at daughter's  request.  I left a message with Tommi Rumps, CM with [facility] as well to update regarding discharge to the home tomorrow.     Expected Discharge Plan: [place] Barriers to Discharge: Continued Medical Work up  Expected Discharge Plan and Services   Discharge Planning Services: CM Consult Post Acute Care Choice: [place] arrangements for the past 2 months: [place] [employer]                 DME Arranged: Hospital bed DME Agency: [employer] Date DME Agency Contacted: 05/26/22 Time DME Agency Contacted: [id number] Representative spoke with at DME Agency: Jeani Hawking HH Arranged: PT, RN [facility] Agency: [place] Date Olivia Lopez de Gutierrez: 05/26/22 Time [place]: [id number] Representative spoke with at [place]: [place] ([place]) Interventions SDOH Screenings   Food Insecurity: No Food Insecurity (05/22/2022)  Housing: Low Risk  (05/22/2022)  Transportation Needs: No Transportation Needs (05/22/2022)  Utilities: Not At Risk (05/22/2022)  Depression (PHQ2-9): Low Risk  (05/05/2022)  Financial Resource Strain: Low Risk  (05/05/2022)  Physical Activity: Inactive (05/05/2022)  Social Connections: Socially Isolated (05/05/2022)  Stress: No Stress Concern Present (05/05/2022)  Tobacco Use: Medium Risk (05/26/2022)    Readmission Risk Interventions    11/12/2021    2:59 PM  Readmission Risk Prevention Plan  Post Dischage Appt Complete  Medication  Screening Complete  Transportation Screening Complete

## 2022-05-27 NOTE — Progress Notes (Signed)
Mobility Specialist - Progress Note   05/27/22 1315  Mobility  Activity Stood at bedside  Level of Assistance Minimal assist, patient does 75% or more  Assistive Device Front wheel walker  Activity Response Tolerated well  Mobility Referral Yes  $Mobility charge 1 Mobility   Pt was received in bed and agreeable. Pt was MinA throughout session and able to stand EOB. Pt was returned to bed with all needs met and bed alarm on.   Franki Monte  Mobility Specialist Please contact via Solicitor or Rehab office at [phone]

## 2022-05-27 NOTE — Progress Notes (Signed)
Physician Discharge Summary  Wayne [name] [id number] DOB: [birth year] DOA: 05/20/2022  PCP: Baruch Gouty, FNP  Admit date: 05/20/2022 Discharge date: 05/27/2022  Admitted From: Home Discharge disposition: Home with home health  Recommendations at discharge:  Complete the course of antibiotics per ID. Follow-up with ID and podiatry as an outpatient.   Brief narrative: Wayne [name] is a 82 y.o. male with PMH significant for DM2, CAD/stents, PAD, severe pulmonary hypertension, CHF, subdural hematoma, peripheral neuropathy  Patient has PAD and was seen by vascular surgery in September 2023.  He had angiography performed but did not have any options for reconstruction.  He had a foot ulcer overlying the right fifth MTP joint for close to 2 months, followed by podiatry. 2/6, he was seen by podiatrist Dr. Posey Pronto and to have right submetatarsal 5 ulceration with fat layer exposed.  2/22, patient was brought to the ED for worsening wound, weakness, confusion, fatigue, fever of 101.  In the ED, afebrile, hemodynamically stable.  WBC count elevated 10.6. X-ray right foot are concerning for ulceration over the fifth MTP with underlying osteomyelitis. Blood cultures were collected in the ED Started on IV cefepime and IV vancomycin Admitted to [place] consult obtained. 2/25, underwent partial fifth ray amputation by Dr. Loel Lofty  Subjective: Patient was seen and examined this morning.  Lying on bed.  Not in distress.  Pleasantly demented.  Oriented to place only.  No family at bedside.   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   2/25, underwent right partial fifth ray amputation.  Per podiatrist, patient had extensive necrosis of the plantar compartment.   2/27, underwent repeat I&D of right foot.  Follow-up note, extensive  necrotic tissue was present.  May need further I&D/closure in the OR.  Defer to podiatry. Currently improving on IV Rocephin, IV vancomycin and oral Flagyl. WBC count improved.  No fever. Infectious disease recommended at discharge: 4 weeks of ciprofloxacin and Augmentin twice daily. To follow-up with podiatry and ID as an outpatient. Continue pain management with as needed meds. Recent Labs  Lab 05/20/22 2013 05/21/22 0302 05/22/22 1647 05/23/22 0112 05/24/22 0256 05/25/22 0048 05/26/22 0032  WBC 10.6*   < > 10.8* 10.2 7.8 8.6 9.0  LATICACIDVEN 1.0  --   --   --   --   --   --    < > = values in this interval not displayed.   Acute metabolic encephalopathy Patient remains pleasantly confused in the hospital.  At times not oriented to situation.  Not restless or agitated this morning.   AKI on CKD stage IIIa b/l creat 1.3 in 01/2022. Presented with creatinine elevated to 1.69.  Creatinine gradually improved with IV hydration.   Recent Labs    12/04/21 0513 02/16/22 1546 05/20/22 2013 05/21/22 0302 05/22/22 0149 05/22/22 1647 05/23/22 0112 05/24/22 0256 05/25/22 0048 05/26/22 0032  BUN 34* 48* 38* 34* 32* 39* 44* 41* 37* 29*  CREATININE 1.17 1.39* 1.69* 1.49* 1.68* 1.47* 1.61* 1.39* 1.18 [id number]*   Acute metabolic encephalopathy Acute delirium Patient had intermittent episodes of confusion and restlessness.  Continue to maintain circadian rhythm.  Continue melatonin at bedtime ordered if needed. Haldol low-dose as needed if needed.  EKG from 2/22 with QTc 464 ms.  Type 2 diabetes mellitus A1c 5.9 in 05/20/2022 PTA on Farxiga 10 mg daily, glipizide 5 mg daily Continue  the same. Recent Labs  Lab 05/26/22 1223 05/26/22 1706 05/26/22 2119 05/27/22 0823 05/27/22 1202  GLUCAP 166* 187* 153* 154* 132*    Chronic diastolic CHF  Clinically euvolemic.   Recent echo with EF 50-55% and G2 DD PTA on Toprol 12.5 mg twice daily, Farxiga 10 g daily, Lasix 40 mg daily.  Reportedly not taking Entresto. Continue the same at home.  Peripheral artery disease On chart review it was noted that patient was last seen by vascular surgeon Dr. Stanford Breed in September 2023.  Patient had an angiogram done in August which showed peroneal only runoff below the knee, with arborization to the posterior tibial artery.  Patient has been optimized from a vascular standpoint. Not sure why he was not on any aspirin or statin as an outpatient.  CAD/stents Patient has not had any chest pain, shortness of breath or any anginal symptoms.  Continue beta-blocker. Not sure why he was not on any aspirin or statin as an outpatient. Follow-up as an outpatient  Nocturia May have underlying BPH. Has not been seen by urologist as an outpatient. We have started him on Flomax daily.  Impaired mobility Secondary to diabetes, advanced age, recent surgery. PT eval obtained.  Home health PT recommended.  Goals of care Patient is DNR at this time.  Daughter wants to switch him to full code for the time of surgery.  If he makes it through the surgery, she intends to change to DNR again.   Code Status: Full Code.  Wounds:  - Wound / Incision (Open or Dehisced) 11/11/21 Other (Comment) Foot Anterior;Right Open wound to second metatarsal (Active)  Date First Assessed/Time First Assessed: 11/11/21 2010   Wound Type: Other (Comment)  Location: Foot  Location Orientation: Anterior;Right  Wound Description (Comments): Open wound to second metatarsal  Present on Admission: Yes    Assessments 11/11/2021  8:10 PM 12/02/2021 10:00 AM  Dressing Type None --  Site / Wound Assessment Dry;Purple;Pink --  Peri-wound Assessment Erythema (non-blanchable) --  Wound Length (cm) -- 2 cm  Wound Width (cm) -- 2 cm  Wound Depth (cm) -- 1 cm  Wound Volume (cm^3) -- 4 cm^3  Wound Surface Area (cm^2) -- 4 cm^2  Margins Unattached edges (unapproximated) --  Closure None --  Drainage Amount Scant --  Drainage  Description Serosanguineous --     No associated orders.     Wound / Incision (Open or Dehisced) 11/28/21 Skin tear Hand Right (Active)  Date First Assessed/Time First Assessed: 11/28/21 1833   Wound Type: Skin tear  Location: Hand  Location Orientation: Right  Present on Admission: Yes    Assessments 11/28/2021  6:29 PM 12/03/2021  9:10 PM  Dressing Type Foam - Lift dressing to assess site every shift Foam - Lift dressing to assess site every shift  Dressing Changed New --  Dressing Status Clean, Dry, Intact Clean, Dry, Intact  Dressing Change Frequency PRN Every 3 days  Site / Wound Assessment Bleeding;Red --  Treatment Cleansed --     No associated orders.     Wound / Incision (Open or Dehisced) 05/21/22 Other (Comment) Other (Comment) Right (Active)  Date First Assessed/Time First Assessed: 05/21/22 2030   Wound Type: (c) Other (Comment)  Location: (c) Other (Comment)  Location Orientation: Right    Assessments 05/22/2022 12:00 AM 05/26/2022  7:33 PM  Dressing Type None --  Dressing Status -- Clean, Dry, Intact  Site / Wound Assessment Black --  Margins Attached edges (approximated) --  Drainage Amount None None     No associated orders.     Incision (Closed) 05/23/22 Foot Right (Active)  Date First Assessed/Time First Assessed: 05/23/22 0825   Location: Foot  Location Orientation: Right    Assessments 05/23/2022  8:30 AM 05/27/2022  9:55 AM  Dressing Type Compression wrap Compression wrap  Dressing Clean, Dry, Intact Clean, Dry, Intact  Site / Wound Assessment Dressing in place / Unable to assess Clean;Dry;Dressing in place / Unable to assess  Drainage Amount None --     No associated orders.     Incision (Closed) 05/25/22 Foot Right (Active)  Date First Assessed/Time First Assessed: 05/25/22 1727   Location: Foot  Location Orientation: Right    Assessments 05/25/2022  5:45 PM 05/27/2022  9:55 AM  Dressing Type Compression wrap Compression wrap  Dressing Clean, Dry, Intact  Clean, Dry, Intact  Site / Wound Assessment Clean;Dry Dressing in place / Unable to assess  Drainage Amount None --     No associated orders.    Discharge Exam:   Vitals:   05/26/22 1932 05/27/22 0321 05/27/22 0400 05/27/22 0824  BP: (!) 159/65 (!) 169/73 (!) 154/74 (!) 125/49  Pulse: 68 76 71 75  Resp: 16 18    Temp: 97.9 F (36.6 C) 97.6 F (36.4 C)  97.6 F (36.4 C)  TempSrc: Oral Oral  Oral  SpO2: 93% 93%  (!) 84%  Weight:      Height:        Body mass index is 24.26 kg/m.   General exam: Pleasant, elderly Caucasian male.  Cheerful.  Not in distress Skin: No rashes, lesions or ulcers. HEENT: Atraumatic, normocephalic, no obvious bleeding Lungs: Clear to auscultation bilaterally CVS: Regular rate and rhythm, no murmur GI/Abd soft, nontender, nondistended, bowel sound present CNS: Alert, awake, oriented to place and person Psychiatry: Mood appropriate.  Cheerfully confused. Extremities: No pedal edema, no calf tenderness.  Right foot ulcer s/p surgery.  Follow ups:    Follow-up Information     [facility] Follow up.   Specialty: Home Health Services Why: Home health has been arranged. They will contact you to schedule apt in 1 to 2 days post discharge. Contact information: [address] [place] [address] [phone]         [facility] Follow up.   Why: [employer] will be providing DME supplies for the home including a hospital bed.  The hospital bed will be delivered to the home on 05/27/2022. Contact information: [place] [address] [phone]         Baruch Gouty, FNP Follow up.   Specialty: Family Medicine Contact information: [place] [address] [phone]                 Discharge Instructions:   Discharge Instructions     Call MD for:  difficulty breathing, headache or visual disturbances   Complete by: As directed    Call MD for:  extreme fatigue    Complete by: As directed    Call MD for:  hives   Complete by: As directed    Call MD for:  persistant dizziness or light-headedness   Complete by: As directed    Call MD for:  persistant nausea and vomiting   Complete by: As directed    Call MD for:  severe uncontrolled pain   Complete by: As directed    Call MD for:  temperature >  100.4   Complete by: As directed    Diet general   Complete by: As directed    Discharge instructions   Complete by: As directed    Recommendations at discharge:   Complete the course of antibiotics per ID.  Follow-up with ID and podiatry as an outpatient.  General discharge instructions: Follow with Primary MD Thayer Ohm Connye Burkitt, FNP in 7 days  Please request your PCP  to go over your hospital tests, procedures, radiology results at the follow up. Please get your medicines [name] and adjusted.  Your PCP may decide to repeat certain labs or tests as needed. Do not drive, operate heavy machinery, perform activities at heights, swimming or participation in water activities or provide baby sitting services if your were admitted for syncope or siezures until you have seen by Primary MD or a Neurologist and advised to do so again. Wayne [name]. Do not drive, operate heavy machinery, perform activities at heights, swim, participate in water activities or provide baby-sitting services while on medications for pain, sleep and mood until your outpatient physician has reevaluated you and advised to do so again.  You are strongly recommended to comply with the dose, frequency and duration of prescribed medications. Activity: As tolerated with Full fall precautions use walker/cane & assistance as needed Avoid using any recreational substances like cigarette, tobacco, alcohol, or non-prescribed drug. If you experience worsening of your admission symptoms, develop shortness of breath, life threatening emergency,  suicidal or homicidal thoughts you must seek medical attention immediately by calling 911 or calling your MD immediately  if symptoms less severe. You must read complete instructions/literature along with all the possible adverse reactions/side effects for all the medicines you take and that have been prescribed to you. Take any new medicine only after you have completely understood and accepted all the possible adverse reactions/side effects.  Wear Seat belts while driving. You were cared for by a hospitalist during your hospital stay. If you have any questions about your discharge medications or the care you received while you were in the hospital after you are discharged, you can call the unit and ask to speak with the hospitalist or the covering physician. Once you are discharged, your primary care physician will handle any further medical issues. Please note that NO REFILLS for any discharge medications will be authorized once you are discharged, as it is imperative that you return to your primary care physician (or establish a relationship with a primary care physician if you do not have one).   Discharge wound care:   Complete by: As directed    Increase activity slowly   Complete by: As directed        Discharge Medications:   Allergies as of 05/27/2022   No Known Allergies      Medication List     STOP taking these medications    sacubitril-valsartan 49-51 MG Commonly known as: ENTRESTO       TAKE these medications    Accu-Chek Softclix Lancets lancets Use 4 times daily as directed to check blood sugars.   acetaminophen 500 MG tablet Commonly known as: TYLENOL Take 2 tablets (1,000 mg total) by mouth every 8 (eight) hours.   amoxicillin-clavulanate 875-125 MG tablet Commonly known as: AUGMENTIN Take 1 tablet by mouth 2 (two) times daily for 26 days.   BIOTIN PLUS KERATIN PO Take 1 tablet by mouth daily.   blood glucose meter kit and supplies Kit Dispense based  on patient and insurance preference. Use up to four times daily as directed.   Accu-Chek Guide w/Device Kit Use up to four times daily as directed.   ciclopirox 8 % solution Commonly known as: Penlac Apply topically at bedtime. Apply over nail and surrounding skin. Apply daily over previous coat. After seven (7) days, may remove with alcohol and continue cycle.   ciprofloxacin 750 MG tablet Commonly known as: CIPRO Take 1 tablet (750 mg total) by mouth 2 (two) times daily for 26 days.   dapagliflozin propanediol 10 MG Tabs tablet Commonly known as: FARXIGA Take 1 tablet (10 mg total) by mouth daily.   feeding supplement Liqd Take 237 mLs by mouth 2 (two) times daily between meals.   furosemide 40 MG tablet Commonly known as: LASIX Take 1 tablet (40 mg total) by mouth daily.   glipiZIDE 5 MG tablet Commonly known as: GLUCOTROL TAKE 1 TABLET BY MOUTH DAILY BEFORE BREAKFAST.   glucose blood test strip Use as instructed   Accu-Chek Guide test strip Generic drug: glucose blood Use 4 times daily as directed to check bloos sugars.   metoprolol succinate 25 MG 24 hr tablet Commonly known as: TOPROL-XL TAKE 1/2 TABLET BY MOUTH EVERY DAY What changed: when to take this   oxyCODONE 5 MG immediate release tablet Commonly known as: Oxy IR/ROXICODONE Take 1 tablet (5 mg total) by mouth every 6 (six) hours as needed for up to 7 days for moderate pain.   polyethylene glycol 17 g packet Commonly known as: MIRALAX / GLYCOLAX Take 17 g by mouth daily as needed for moderate constipation.   saccharomyces boulardii 250 MG capsule Commonly known as: FLORASTOR Take 1 capsule (250 mg total) by mouth 2 (two) times daily for 28 days.   Santyl 250 UNIT/GM ointment Generic drug: collagenase Apply 1 Application topically daily.   senna-docusate 8.6-50 MG tablet Commonly known as: Senokot-S Take 1 tablet by mouth 2 (two) times daily.   tamsulosin 0.4 MG Caps capsule Commonly known as:  FLOMAX Take 1 capsule (0.4 mg total) by mouth daily after breakfast. Start taking on: May 28, 2022   VITAMIN C PO Take 1 tablet by mouth daily.   Vitamin D (Ergocalciferol) 1.25 MG (50000 UNIT) Caps capsule Commonly known as: DRISDOL Take 1 capsule (50,000 Units total) by mouth every 7 (seven) days.   VITAMIN E PO Take 1 tablet by mouth daily.   ZINC PO Take 1 tablet by mouth daily.               Durable Medical Equipment  (From admission, onward)           Start     Ordered   05/26/22 1620  For home use only DME Hospital bed  Once       Question Answer Comment  Length of Need Lifetime   Patient has (list medical condition): T2DM, CAD, PAD, pulmonary hypertension, and chronic diastolic CHF with Right 5th mpj gas gangrene and osteomyelitis of the proximal phalanx 5th toe and 5th met head   The above medical condition requires: Patient requires the ability to reposition frequently   Head must be elevated greater than: 30 degrees   Bed type Semi-electric   Support Surface: Gel Overlay      05/26/22 1626              Discharge Care Instructions  (From admission, onward)           Start     Ordered  05/27/22 0000  Discharge wound care:        05/27/22 1444             The results of significant diagnostics from this hospitalization (including imaging, microbiology, ancillary and laboratory) are listed below for reference.    Procedures and Diagnostic Studies:   DG Foot Complete Right  Result Date: 05/20/2022 CLINICAL DATA:  Diabetic foot ulcer, sepsis EXAM: RIGHT FOOT COMPLETE - 3+ VIEW COMPARISON:  12/22/2021 FINDINGS: Frontal, oblique, and lateral views of the right foot are obtained. There is soft tissue ulceration overlying the fifth metatarsophalangeal joint, with extensive subcutaneous gas. There is osteopenia surrounding the fifth metatarsophalangeal joint, with erosive changes of the base of the fifth proximal phalanx consistent with  osteomyelitis. There are no other acute bony abnormalities. Diffuse osteoarthritis. Marked soft tissue swelling throughout the foot, with extensive atherosclerosis again noted. IMPRESSION: 1. Soft tissue ulceration overlying the fifth metatarsophalangeal joint, with underlying osteopenia and bony erosions consistent with osteomyelitis. 2. Diffuse soft tissue swelling. Electronically Signed   By: Randa Ngo M.D.   On: 05/20/2022 19:52   DG Chest 2 View  Result Date: 05/20/2022 CLINICAL DATA:  Suspected sepsis.  Diabetic foot ulcer. EXAM: CHEST - 2 VIEW COMPARISON:  None 723 FINDINGS: Heart size and pulmonary vascularity are normal. Lungs are clear. No pleural effusions. No pneumothorax. Mediastinal contours appear intact. Calcification of the aorta. Degenerative changes in the spine and shoulders. Old left rib fractures. IMPRESSION: No active cardiopulmonary disease. Electronically Signed   By: Lucienne Capers M.D.   On: 05/20/2022 19:48     Labs:   Basic Metabolic Panel: Recent Labs  Lab 05/22/22 1647 05/23/22 0112 05/24/22 0256 05/25/22 0048 05/26/22 0032  NA 135 136 138 139 138  K 4.1 4.0 4.0 4.1 4.4  CL 103 105 106 111 109  CO2 21* 20* 21* 18* 16*  GLUCOSE 240* 242* 188* 149* 216*  BUN 39* 44* 41* 37* 29*  CREATININE 1.47* 1.61* 1.39* 1.18 1.33*  CALCIUM 8.4* 8.4* 8.5* 8.4* 8.3*   GFR Estimated Creatinine Clearance: 47.6 mL/min (A) (by C-G formula based on SCr of 1.33 mg/dL (H)). Liver Function Tests: Recent Labs  Lab 05/20/22 2013  AST 15  ALT 13  ALKPHOS 148*  BILITOT 1.0  PROT 6.9  ALBUMIN 3.1*   No results for input(s): "LIPASE", "AMYLASE" in the last 168 hours. No results for input(s): "AMMONIA" in the last 168 hours. Coagulation profile Recent Labs  Lab 05/20/22 2013  INR 1.2    CBC: Recent Labs  Lab 05/20/22 2013 05/21/22 0302 05/22/22 1647 05/23/22 0112 05/24/22 0256 05/25/22 0048 05/26/22 0032  WBC 10.6*   < > 10.8* 10.2 7.8 8.6 9.0   NEUTROABS 7.8*  --   --   --  5.4 6.1 6.5  HGB 12.5*   < > 12.6* 12.6* 12.2* 12.8* 12.5*  HCT 38.0*   < > 36.7* 37.1* 36.9* 38.9* 38.4*  MCV 88.6   < > 85.3 85.5 88.1 87.6 89.5  PLT 177   < > 194 208 199 180 260   < > = values in this interval not displayed.   Cardiac Enzymes: No results for input(s): "CKTOTAL", "CKMB", "CKMBINDEX", "TROPONINI" in the last 168 hours. BNP: Invalid input(s): "POCBNP" CBG: Recent Labs  Lab 05/26/22 1223 05/26/22 1706 05/26/22 2119 05/27/22 0823 05/27/22 1202  GLUCAP 166* 187* 153* 154* 132*   D-Dimer No results for input(s): "DDIMER" in the last 72 hours. Hgb A1c No results for  input(s): "HGBA1C" in the last 72 hours. Lipid Profile No results for input(s): "CHOL", "HDL", "LDLCALC", "TRIG", "CHOLHDL", "LDLDIRECT" in the last 72 hours. Thyroid function studies No results for input(s): "TSH", "T4TOTAL", "T3FREE", "THYROIDAB" in the last 72 hours.  Invalid input(s): "FREET3" Anemia work up No results for input(s): "VITAMINB12", "FOLATE", "FERRITIN", "TIBC", "IRON", "RETICCTPCT" in the last 72 hours. Microbiology Recent Results (from the past 240 hour(s))  Culture, blood (Routine x 2)     Status: None   Collection Time: 05/20/22  8:13 PM   Specimen: BLOOD  Result Value Ref Range Status   Specimen Description BLOOD LEFT ANTECUBITAL  Final   Special Requests   Final    BOTTLES DRAWN AEROBIC AND ANAEROBIC Blood Culture adequate volume   Culture   Final    NO GROWTH 5 DAYS Performed at [facility], [address] [place], [place] [address]    Report Status 05/25/2022 FINAL  Final  Culture, blood (Routine x 2)     Status: None   Collection Time: 05/20/22  8:13 PM   Specimen: BLOOD  Result Value Ref Range Status   Specimen Description BLOOD BLOOD RIGHT ARM  Final   Special Requests   Final    BOTTLES DRAWN AEROBIC AND ANAEROBIC Blood Culture adequate volume   Culture   Final    NO GROWTH 5 DAYS Performed at [facility], [address] [place], [place] [address]    Report Status 05/25/2022 FINAL  Final  Aerobic/Anaerobic Culture w Gram Stain (surgical/deep wound)     Status: None (Preliminary result)   Collection Time: 05/23/22  8:05 AM   Specimen: PATH Soft tissue resection  Result Value Ref Range Status   Specimen Description TISSUE  Final   Special Requests RIGHT 5TH RAY MPJ  Final   Gram Stain   Final    NO WBC SEEN FEW GRAM POSITIVE COCCI IN PAIRS RARE GRAM NEGATIVE RODS Performed at [facility], [place] [address], [place], [place] [address]    Culture   Final    ABUNDANT ENTEROCOCCUS FAECALIS RARE PSEUDOMONAS AERUGINOSA RARE DIPHTHEROIDS(CORYNEBACTERIUM SPECIES) Standardized susceptibility testing for this organism is not available. NO ANAEROBES ISOLATED; CULTURE IN PROGRESS FOR 5 DAYS    Report Status PENDING  Incomplete   Organism ID, Bacteria ENTEROCOCCUS FAECALIS  Final   Organism ID, Bacteria PSEUDOMONAS AERUGINOSA  Final      Susceptibility   Enterococcus faecalis - MIC*    AMPICILLIN <=2 SENSITIVE Sensitive     VANCOMYCIN 1 SENSITIVE Sensitive     GENTAMICIN SYNERGY SENSITIVE Sensitive     * ABUNDANT ENTEROCOCCUS FAECALIS   Pseudomonas aeruginosa - MIC*    CEFTAZIDIME 4 SENSITIVE Sensitive     CIPROFLOXACIN <=0.25 SENSITIVE Sensitive     GENTAMICIN <=1 SENSITIVE Sensitive     IMIPENEM 2 SENSITIVE Sensitive     PIP/TAZO <=4 SENSITIVE Sensitive     CEFEPIME 2 SENSITIVE Sensitive     * RARE PSEUDOMONAS AERUGINOSA  Aerobic/Anaerobic Culture w Gram Stain (surgical/deep wound)     Status: None   Collection Time: 05/23/22  8:06 AM   Specimen: PATH Other; Tissue  Result Value Ref Range Status   Specimen Description WOUND  Final   Special Requests RIGHT 5TH RAY MPJ  Final   Gram Stain   Final    NO WBC SEEN MODERATE GRAM POSITIVE COCCI IN PAIRS    Culture   Final    ABUNDANT ENTEROCOCCUS FAECALIS SUSCEPTIBILITIES PERFORMED ON PREVIOUS CULTURE WITHIN  THE LAST 5 DAYS. FEW  DIPHTHEROIDS(CORYNEBACTERIUM SPECIES) Standardized susceptibility testing for this organism is not available. RARE PREVOTELLA SPECIES BETA LACTAMASE POSITIVE Performed at [facility], [place] [address], [place], [place] [address]    Report Status 05/26/2022 FINAL  Final  Aerobic/Anaerobic Culture w Gram Stain (surgical/deep wound)     Status: None (Preliminary result)   Collection Time: 05/25/22  4:55 PM   Specimen: PATH Bone resection; Tissue  Result Value Ref Range Status   Specimen Description BONE  Final   Special Requests  5TH METATARSAL  Final   Gram Stain NO WBC SEEN NO ORGANISMS SEEN   Final   Culture   Final    NO GROWTH < 24 HOURS Performed at [facility], [place] [address] [place], [place] [address]    Report Status PENDING  Incomplete    Time coordinating discharge: 35 minutes  Signed: Kenley Troop  [facility] 05/27/2022, 2:44 PM

## 2022-05-27 NOTE — Plan of Care (Signed)
  Problem: Metabolic: Goal: Ability to maintain appropriate glucose levels will improve Outcome: Progressing   Problem: Nutritional: Goal: Maintenance of adequate nutrition will improve Outcome: Progressing   Problem: Skin Integrity: Goal: Risk for impaired skin integrity will decrease Outcome: Progressing   Problem: Tissue Perfusion: Goal: Adequacy of tissue perfusion will improve Outcome: Progressing   Problem: Activity: Goal: Risk for activity intolerance will decrease Outcome: Progressing   Problem: Pain Managment: Goal: General experience of comfort will improve Outcome: Progressing   Problem: Safety: Goal: Ability to remain free from injury will improve Outcome: Progressing

## 2022-05-28 ENCOUNTER — Inpatient Hospital Stay (HOSPITAL_COMMUNITY): Payer: Medicare Other

## 2022-05-28 LAB — CBC WITH DIFFERENTIAL/PLATELET
Abs Immature Granulocytes: 0.17 10*3/uL — ABNORMAL HIGH (ref 0.00–0.07)
Basophils Absolute: 0.1 10*3/uL (ref 0.0–0.1)
Basophils Relative: 1 %
Eosinophils Absolute: 0.2 10*3/uL (ref 0.0–0.5)
Eosinophils Relative: 2 %
HCT: 42.5 % (ref 39.0–52.0)
Hemoglobin: 13.3 g/dL (ref 13.0–17.0)
Immature Granulocytes: 2 %
Lymphocytes Relative: 8 %
Lymphs Abs: 0.8 10*3/uL (ref 0.7–4.0)
MCH: 28.4 pg (ref 26.0–34.0)
MCHC: 31.3 g/dL (ref 30.0–36.0)
MCV: 90.8 fL (ref 80.0–100.0)
Monocytes Absolute: 0.5 10*3/uL (ref 0.1–1.0)
Monocytes Relative: 5 %
Neutro Abs: 7.9 10*3/uL — ABNORMAL HIGH (ref 1.7–7.7)
Neutrophils Relative %: 82 %
Platelets: 304 10*3/uL (ref 150–400)
RBC: 4.68 MIL/uL (ref 4.22–5.81)
RDW: 13.4 % (ref 11.5–15.5)
WBC: 9.6 10*3/uL (ref 4.0–10.5)
nRBC: 0 % (ref 0.0–0.2)

## 2022-05-28 LAB — BASIC METABOLIC PANEL
Anion gap: 17 — ABNORMAL HIGH (ref 5–15)
BUN: 18 mg/dL (ref 8–23)
CO2: 16 mmol/L — ABNORMAL LOW (ref 22–32)
Calcium: 8.5 mg/dL — ABNORMAL LOW (ref 8.9–10.3)
Chloride: 106 mmol/L (ref 98–111)
Creatinine, Ser: 1.39 mg/dL — ABNORMAL HIGH (ref 0.61–1.24)
GFR, Estimated: 50 mL/min — ABNORMAL LOW (ref >60)
Glucose, Bld: 177 mg/dL — ABNORMAL HIGH (ref 70–99)
Potassium: 4.3 mmol/L (ref 3.5–5.1)
Sodium: 139 mmol/L (ref 135–145)

## 2022-05-28 LAB — GLUCOSE, CAPILLARY
Glucose-Capillary: 146 mg/dL — ABNORMAL HIGH (ref 70–99)
Glucose-Capillary: 174 mg/dL — ABNORMAL HIGH (ref 70–99)
Glucose-Capillary: 168 mg/dL — ABNORMAL HIGH (ref 70–99)
Glucose-Capillary: 158 mg/dL — ABNORMAL HIGH (ref 70–99)

## 2022-05-28 MED ORDER — DOCUSATE SODIUM 100 MG PO CAPS: 100.0000 mg | ORAL_CAPSULE | Freq: Two times a day (BID) | ORAL | Status: DC | PRN

## 2022-05-28 MED ORDER — BISACODYL 10 MG RE SUPP
10.0000 mg | Freq: Every day | RECTAL | Status: DC | PRN
  Administered 2022-05-28: 10 mg via RECTAL
  Filled 2022-05-28: qty 1

## 2022-05-28 MED ORDER — ACETAMINOPHEN 500 MG PO TABS: 1000.0000 mg | ORAL_TABLET | Freq: Three times a day (TID) | ORAL | Status: DC | PRN

## 2022-05-28 MED ORDER — DOCUSATE SODIUM 100 MG PO CAPS: 100.0000 mg | ORAL_CAPSULE | Freq: Two times a day (BID) | ORAL | Status: DC

## 2022-05-28 MED ORDER — FUROSEMIDE 10 MG/ML IJ SOLN
20.0000 mg | Freq: Once | INTRAMUSCULAR | Status: AC
  Administered 2022-05-28: 20 mg via INTRAVENOUS
  Filled 2022-05-28: qty 4

## 2022-05-28 MED ORDER — FUROSEMIDE 10 MG/ML IJ SOLN
20.0000 mg | Freq: Two times a day (BID) | INTRAMUSCULAR | Status: DC
  Administered 2022-05-28 – 2022-05-29 (×2): 20 mg via INTRAVENOUS
  Filled 2022-05-28 (×2): qty 4

## 2022-05-28 NOTE — Progress Notes (Signed)
[facility]) Hospital liaison note  Notified by [facility] manager of patient/family request for [facility] Palliative services at home after discharge.  [facility] will follow patient for discharge disposition.  Please call with any hospice or outpatient palliative care related questions.   Thank you for the opportunity to participate in this patient's care.  [place]  [facility] liaison  [phone]

## 2022-05-28 NOTE — Progress Notes (Signed)
Mobility Specialist - Progress Note   05/28/22 1126  Mobility  Activity Stood at bedside  Level of Assistance Moderate assist, patient does 50-74%  Assistive Device Front wheel walker  Activity Response Tolerated fair  Mobility Referral Yes  $Mobility charge 1 Mobility   Pt was received in bed and agreeable to mobility. Pt was able to complete x2 bout of sit to stands. Pt was ModA throughout session. Pt was returned to bed with all needs met.   Franki Monte  Mobility Specialist Please contact via Solicitor or Rehab office at [phone]

## 2022-05-28 NOTE — TOC Progression Note (Addendum)
Transition of Care [facility]) - Progression Note    Patient Details  Name: Wayne [name] MRN: [id number] Date of Birth: 10-19-1940  Transition of Care [facility]) CM/SW [place], RN Phone Number: 05/28/2022, 10:00 AM  Clinical Narrative:    CM spoke with the patient's daughter by phone and she is aware that patient will likely discharge to home - once he is medically stable for discharge - patient is receiving IV lasix this morning per bedside nursing.  I called [facility] and placed them on "will call" and bedside will call them back to reschedule when the patient is ready.  Bedside nursing to call the daughter and give discharge instructions by phone if family is not present at bedside.  I spoke with [name], CM with [facility] and they will follow up for OUtpatient palliative services at the home and the daughter is aware.  I called Tommi Rumps, CM with [place] and private duty CM with [facility] will be following the patient for services starting today in the home.  [facility] number to call - [phone].   Expected Discharge Plan: [place] Barriers to Discharge: Continued Medical Work up  Expected Discharge Plan and Services   Discharge Planning Services: CM Consult Post Acute Care Choice: [place] arrangements for the past 2 months: Single Family Home Expected Discharge Date: 05/27/22               DME Arranged: Hospital bed DME Agency: [employer] Date DME Agency Contacted: 05/26/22 Time DME Agency Contacted: [id number] Representative spoke with at DME Agency: Jeani Hawking HH Arranged: PT, RN [facility] Agency: [place] Date [place]: 05/26/22 Time [place]: [id number] Representative spoke with at [place]: [place] ([place]) Interventions SDOH Screenings   Food Insecurity: No Food Insecurity (05/22/2022)  Housing: Low Risk  (05/22/2022)  Transportation Needs: No Transportation Needs (05/22/2022)   Utilities: Not At Risk (05/22/2022)  Depression (PHQ2-9): Low Risk  (05/05/2022)  Financial Resource Strain: Low Risk  (05/05/2022)  Physical Activity: Inactive (05/05/2022)  Social Connections: Socially Isolated (05/05/2022)  Stress: No Stress Concern Present (05/05/2022)  Tobacco Use: Medium Risk (05/27/2022)    Readmission Risk Interventions    11/12/2021    2:59 PM  Readmission Risk Prevention Plan  Post Dischage Appt Complete  Medication Screening Complete  Transportation Screening Complete

## 2022-05-28 NOTE — Progress Notes (Signed)
PT Cancellation Note  Patient Details Name: Wayne [name] MRN: [id number] DOB: [birth year]   Cancelled Treatment:    Reason Eval/Treat Not Completed: (P) Patient declined, no reason specified, pt declining all mobility despite education and encouragement. Will check back as schedule allows to continue with PT POC.  Audry Riles. PTA Acute Rehabilitation Services Office: [facility] 05/28/2022, 2:16 PM

## 2022-05-28 NOTE — Progress Notes (Signed)
  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  Per podiatrist, patient had extensive necrosis of the plantar compartment.   - 2/27, underwent repeat I&D of right foot.  Follow-up note, extensive necrotic tissue was present - Infectious disease recommended at discharge: 4 weeks of ciprofloxacin and Augmentin twice daily. - To follow-up with podiatry and ID as an outpatient - Augmentin 875 mg PO q12  - Vit C 500 mg PO daily  - Ciprofloxacin 750 mg PO bid  - Ensure enlive PO bid  - IV dilaudid 0.5 mg q4 hr PRN  - Oxycodone 5 mg PO q6 hr PRN  - Senna S 1 tab PO daily   Acute metabolic encephalopathy - Monitor  - Appears resolved    AKI on CKD stage IIIa - Monitor  Acute metabolic encephalopathy Acute delirium - Haldol low-dose as needed if needed.  EKG from 2/22 with QTc 464 ms.   Type 2 diabetes mellitus - A1c 5.9 in 05/20/2022 - Farxiga 10 mg PO daily  - Novolog SS ACHS    Chronic diastolic CHF  - IV lasix 20 mg bid    Peripheral artery disease - Complicating care    CAD/stents - Toprol XL 12.5 mg PO daily    Nocturia - Flomax 0.4 mg PO daily    Impaired mobility - PT eval obtained.  Home health PT recommended.  DVT prophylaxis: Heparin 5000 units sq q8hr      Subjective: Pt seen and examined at the bedside. This morning pt had some wheezes. CXR showed new pulmonary edema. He was started on IV lasix 20 mg bid. Re-assess possibility of discharge tmr.  Physical Exam: Vitals:   05/27/22 1732 05/27/22 1943 05/28/22 0310 05/28/22 0741  BP: (!) 165/72 (!) 155/63 (!) 143/52 (!) 160/74  Pulse: 73 77 74 89  Resp:  18 16   Temp: 98.5 F (36.9 C) 98 F (36.7  C) 97.7 F (36.5 C) 97.9 F (36.6 C)  TempSrc: Oral Oral Oral Oral  SpO2: 94% 93% 93% 93%  Weight:      Height:       Physical Exam Constitutional:      Appearance: Normal appearance.  HENT:     Head: Normocephalic.     Mouth/Throat:     Mouth: Mucous membranes are moist.  Cardiovascular:     Rate and Rhythm: Normal rate and regular rhythm.  Pulmonary:     Breath sounds: Wheezing and rales present.  Abdominal:     General: Abdomen is flat.     Palpations: Abdomen is soft.  Musculoskeletal:        General: Normal range of motion.     Cervical back: Neck supple.  Skin:    General: Skin is warm.  Neurological:     Mental Status: He is alert. Mental status is at baseline.  Psychiatric:        Mood and Affect: Mood normal.    Data Reviewed:   Disposition: Status is: Inpatient  Planned Discharge Destination: Home with Home Health    Time spent: 35 minutes  Author: Lucienne Minks , MD 05/28/2022 2:24 PM  For on call review [email or web address].

## 2022-05-29 LAB — COMPREHENSIVE METABOLIC PANEL
ALT: 22 U/L (ref 0–44)
AST: 24 U/L (ref 15–41)
Albumin: 2.1 g/dL — ABNORMAL LOW (ref 3.5–5.0)
Alkaline Phosphatase: 249 U/L — ABNORMAL HIGH (ref 38–126)
Anion gap: 17 — ABNORMAL HIGH (ref 5–15)
BUN: 21 mg/dL (ref 8–23)
CO2: 18 mmol/L — ABNORMAL LOW (ref 22–32)
Calcium: 8.6 mg/dL — ABNORMAL LOW (ref 8.9–10.3)
Chloride: 105 mmol/L (ref 98–111)
Creatinine, Ser: 1.56 mg/dL — ABNORMAL HIGH (ref 0.61–1.24)
GFR, Estimated: 44 mL/min — ABNORMAL LOW (ref >60)
Glucose, Bld: 155 mg/dL — ABNORMAL HIGH (ref 70–99)
Potassium: 4.2 mmol/L (ref 3.5–5.1)
Sodium: 140 mmol/L (ref 135–145)
Total Bilirubin: 1.1 mg/dL (ref 0.3–1.2)
Total Protein: 5.6 g/dL — ABNORMAL LOW (ref 6.5–8.1)

## 2022-05-29 LAB — CBC
HCT: 38.2 % — ABNORMAL LOW (ref 39.0–52.0)
Hemoglobin: 12.7 g/dL — ABNORMAL LOW (ref 13.0–17.0)
MCH: 29.2 pg (ref 26.0–34.0)
MCHC: 33.2 g/dL (ref 30.0–36.0)
MCV: 87.8 fL (ref 80.0–100.0)
Platelets: 291 10*3/uL (ref 150–400)
RBC: 4.35 MIL/uL (ref 4.22–5.81)
RDW: 13.4 % (ref 11.5–15.5)
WBC: 11.1 10*3/uL — ABNORMAL HIGH (ref 4.0–10.5)
nRBC: 0 % (ref 0.0–0.2)

## 2022-05-29 LAB — MAGNESIUM: Magnesium: 2.3 mg/dL (ref 1.7–2.4)

## 2022-05-29 LAB — GLUCOSE, CAPILLARY: Glucose-Capillary: 162 mg/dL — ABNORMAL HIGH (ref 70–99)

## 2022-05-29 NOTE — TOC Transition Note (Signed)
Transition of Care [facility]) - CM/SW Discharge Note   Patient Details  Name: Wayne [name] MRN: [id number] Date of Birth: [birth year]  Transition of [facility]) CM/SW Contact:  Carles Collet, RN Phone Number: 05/29/2022, 9:36 AM   Clinical Narrative:     Spoke with patient's daughter she states that she is ready for the patient to return home. She confirmed address for [facility], Corey Harold has been called and forms have been sent to the unit. The nurse is notified and will call the daughter when [facility]y [facility] arrives. [facility] notified of DC.   Final next level of care: Home w Home Health Services Barriers to Discharge: Continued Medical Work up   Patient Goals and CMS Choice CMS Medicare.gov Compare Post Acute Care list provided to:: Patient Choice offered to / list presented to : Patient  Discharge Placement                         Discharge Plan and Services Additional resources added to the After Visit Summary for     Discharge Planning Services: CM Consult Post Acute Care Choice: Home Health          DME Arranged: Hospital bed DME Agency: [employer] Date DME Agency Contacted: 05/26/22 Time DME Agency Contacted: [id number] Representative spoke with at DME Agency: Jeani Hawking HH Arranged: PT, RN [facility] Agency: [place] Date [place]: 05/26/22 Time [place]: [id number] Representative spoke with at [place]: [place] ([place]) Interventions SDOH Screenings   Food Insecurity: No Food Insecurity (05/22/2022)  Housing: Low Risk  (05/22/2022)  Transportation Needs: No Transportation Needs (05/22/2022)  Utilities: Not At Risk (05/22/2022)  Depression (PHQ2-9): Low Risk  (05/05/2022)  Financial Resource Strain: Low Risk  (05/05/2022)  Physical Activity: Inactive (05/05/2022)  Social Connections: Socially Isolated (05/05/2022)  Stress: No Stress Concern Present (05/05/2022)  Tobacco Use: Medium Risk (05/27/2022)     Readmission Risk Interventions     11/12/2021    2:59 PM  Readmission Risk Prevention Plan  Post Dischage Appt Complete  Medication Screening Complete  Transportation Screening Complete

## 2022-05-31 ENCOUNTER — Telehealth: Payer: Self-pay

## 2022-05-31 LAB — AEROBIC/ANAEROBIC CULTURE W GRAM STAIN (SURGICAL/DEEP WOUND)
Culture: NO GROWTH
Gram Stain: NONE SEEN
Gram Stain: NONE SEEN

## 2022-05-31 NOTE — Telephone Encounter (Signed)
(  3:54 pm) PC SW scheduled an initial telephonic visit with palliative care nurse for 06/04/22 @ 10 am with the palliative care nurse.

## 2022-05-31 NOTE — Transitions of Care (Post Inpatient/ED Visit) (Signed)
   05/31/2022  Name: Wayne [name] MRN: [id number] DOB: 09-12-1940  Today's TOC FU Call Status: Today's TOC FU Call Status:: Successful TOC FU Call Competed TOC FU Call Complete Date: 05/31/22  Transition Care Management Follow-up Telephone Call Date of Discharge: 05/29/22 Discharge Facility: [facility]) Type of Discharge: Inpatient Admission Primary Inpatient Discharge Diagnosis:: osteomyelitis How have you been since you were released from the hospital?: Better Any questions or concerns?: No  Items Reviewed: Did you receive and understand the discharge instructions provided?: Yes Medications obtained and verified?: Yes (Medications Reviewed) Any new allergies since your discharge?: No Dietary orders reviewed?: Yes Do you have support at home?: Yes People in Home: spouse  Home Care and Equipment/Supplies: [place] Ordered?: No Any new equipment or medical supplies ordered?: Yes Name of Medical supply agency?: [facility] Were you able to get the equipment/medical supplies?: Yes Do you have any questions related to the use of the equipment/supplies?: No  Functional Questionnaire: Do you need assistance with bathing/showering or dressing?: Yes Do you need assistance with meal preparation?: No Do you need assistance with eating?: No Do you have difficulty maintaining continence: No Do you need assistance with getting out of bed/getting out of a chair/moving?: Yes Do you have difficulty managing or taking your medications?: No  Folllow up appointments reviewed: PCP Follow-up appointment confirmed?: No (declined appt for now) MD Provider Line Number:[phone] Given: No [facility] Follow-up appointment confirmed?: No Follow-Up Specialty Provider:: Ortho Reason Specialist Follow-Up Not Confirmed: Patient has Specialist Provider Number and will Call for Appointment Do you need transportation to your follow-up appointment?: No Do you understand  care options if your condition(s) worsen?: Yes-patient verbalized understanding    SIGNATURE Juanda Crumble, [place] Nurse Health Advisor Direct Dial [phone]

## 2022-06-01 ENCOUNTER — Encounter: Payer: Self-pay | Admitting: Family Medicine

## 2022-06-01 ENCOUNTER — Ambulatory Visit: Payer: Medicare Other | Admitting: Podiatry

## 2022-06-01 DIAGNOSIS — L89892 Pressure ulcer of other site, stage 2: Secondary | ICD-10-CM | POA: Diagnosis not present

## 2022-06-01 DIAGNOSIS — S32019D Unspecified fracture of first lumbar vertebra, subsequent encounter for fracture with routine healing: Secondary | ICD-10-CM | POA: Diagnosis not present

## 2022-06-01 DIAGNOSIS — L97511 Non-pressure chronic ulcer of other part of right foot limited to breakdown of skin: Secondary | ICD-10-CM | POA: Diagnosis not present

## 2022-06-01 DIAGNOSIS — E11621 Type 2 diabetes mellitus with foot ulcer: Secondary | ICD-10-CM | POA: Diagnosis not present

## 2022-06-01 DIAGNOSIS — S92501D Displaced unspecified fracture of right lesser toe(s), subsequent encounter for fracture with routine healing: Secondary | ICD-10-CM | POA: Diagnosis not present

## 2022-06-02 ENCOUNTER — Telehealth: Payer: Self-pay | Admitting: Family Medicine

## 2022-06-02 ENCOUNTER — Encounter: Payer: Self-pay | Admitting: Internal Medicine

## 2022-06-02 ENCOUNTER — Telehealth: Payer: Self-pay | Admitting: Podiatry

## 2022-06-02 NOTE — Telephone Encounter (Signed)
Pts daughter called back and is scheduled to see Dr Loel Lofty on 3.14.2024.

## 2022-06-02 NOTE — Telephone Encounter (Signed)
Called to get pt scheduled for the hospital follow up and she is trying to get transportation for pt as he is not able to walk and is in a wheelchair. She is trying to find a service that can transport a wheelchair while he is in it.Wayne KitchenShe will call once she gets this lined up. I did tell her to check with pts insurance as sometimes they can provide transportation

## 2022-06-03 ENCOUNTER — Encounter: Payer: Self-pay | Admitting: Family Medicine

## 2022-06-03 DIAGNOSIS — E11621 Type 2 diabetes mellitus with foot ulcer: Secondary | ICD-10-CM | POA: Diagnosis not present

## 2022-06-03 DIAGNOSIS — L97511 Non-pressure chronic ulcer of other part of right foot limited to breakdown of skin: Secondary | ICD-10-CM | POA: Diagnosis not present

## 2022-06-03 DIAGNOSIS — L89892 Pressure ulcer of other site, stage 2: Secondary | ICD-10-CM | POA: Diagnosis not present

## 2022-06-03 DIAGNOSIS — S92501D Displaced unspecified fracture of right lesser toe(s), subsequent encounter for fracture with routine healing: Secondary | ICD-10-CM | POA: Diagnosis not present

## 2022-06-03 DIAGNOSIS — S32019D Unspecified fracture of first lumbar vertebra, subsequent encounter for fracture with routine healing: Secondary | ICD-10-CM | POA: Diagnosis not present

## 2022-06-03 NOTE — Telephone Encounter (Signed)
TC Alexis OT w/ [facility] VO given ok to hold off for a little bit d/t pt just having an amputation

## 2022-06-04 ENCOUNTER — Other Ambulatory Visit: Payer: Medicare Other

## 2022-06-04 DIAGNOSIS — L89612 Pressure ulcer of right heel, stage 2: Secondary | ICD-10-CM | POA: Diagnosis not present

## 2022-06-04 DIAGNOSIS — Z515 Encounter for palliative care: Secondary | ICD-10-CM

## 2022-06-04 NOTE — Progress Notes (Signed)
PATIENT NAME: Wayne [name] DOB: [birth year] MRN: [id number]  PRIMARY CARE PROVIDER: Baruch Gouty, FNP  RESPONSIBLE PARTY:  Acct ID - Guarantor Home Phone Work Phone Relationship Acct Type  [id number] [place]je, Albu [phone]  Self P/F     [address] [place], [place] [address]   Palliative Care Encounter Note     I connected with daughter Larwance Sachs in reference to [name] on 06/04/22 by telephone and verified that I am speaking with the correct person using two identifiers.    HISTORY OF PRESENT ILLNESS:    Respiratory: No issues at this time  Cognitive: alert but seems depressed; gets confused in the evenings   Appetite: not eating much; paid caregivers/family having a difficult time getting him to eat and drink protein shakes; Sula Soda reports he will eat for his ex wife  GI/GU: daughter gave pt magnesium citrate at 0930am due to constipation; no results at this time   Mobility: pt has a partial amputation; able to get up to bathroom  ADLs: needs assist from paid caregivers   Sleeping Pattern: sleeps throughout the day and all night  Palliative Care/ Hospice: RN explained role and purpose of palliative care including visit frequency. Also discussed benefits of hospice care as well as the differences between the two with patient.   Goals of Care: Daughter want the pt stay in the home with paid caregivers as long as possible  CODE STATUS: DNR ADVANCED DIRECTIVES: N MOST FORM: N    PHYSICAL EXAM:   VITALS:There were no vitals filed for this visit.    Daughter Sula Soda feels that the patient would benefit greatly from face to face visits. She has chosen to Environmental health practitioner for Palliative services.     Colten Desroches Georgann Housekeeper, LPN

## 2022-06-07 ENCOUNTER — Telehealth: Payer: Medicare Other | Admitting: *Deleted

## 2022-06-07 DIAGNOSIS — S32019D Unspecified fracture of first lumbar vertebra, subsequent encounter for fracture with routine healing: Secondary | ICD-10-CM | POA: Diagnosis not present

## 2022-06-07 DIAGNOSIS — L89892 Pressure ulcer of other site, stage 2: Secondary | ICD-10-CM | POA: Diagnosis not present

## 2022-06-07 DIAGNOSIS — S92501D Displaced unspecified fracture of right lesser toe(s), subsequent encounter for fracture with routine healing: Secondary | ICD-10-CM | POA: Diagnosis not present

## 2022-06-07 DIAGNOSIS — E11621 Type 2 diabetes mellitus with foot ulcer: Secondary | ICD-10-CM | POA: Diagnosis not present

## 2022-06-07 DIAGNOSIS — L97511 Non-pressure chronic ulcer of other part of right foot limited to breakdown of skin: Secondary | ICD-10-CM | POA: Diagnosis not present

## 2022-06-07 NOTE — Telephone Encounter (Signed)
Wayne [name] with [facility] called -   She had a visit with patient today and noticed some redness along the incision. He doesn't have a fever, no pain, not hot or warm to the touch. She feels its from pressure. She was informed that he had been up on it a lot this weekend when advised against being on the foot, but wanted to make sure she reported the redness anyways.   If there is any instructions that she needs, let her know.

## 2022-06-08 NOTE — Telephone Encounter (Signed)
I don't have a contact number for this Nurse and when I called [facility] they had no clue who I was referring to.

## 2022-06-08 NOTE — Telephone Encounter (Signed)
Ok, thanks.

## 2022-06-09 DIAGNOSIS — S32019D Unspecified fracture of first lumbar vertebra, subsequent encounter for fracture with routine healing: Secondary | ICD-10-CM | POA: Diagnosis not present

## 2022-06-09 DIAGNOSIS — L97511 Non-pressure chronic ulcer of other part of right foot limited to breakdown of skin: Secondary | ICD-10-CM | POA: Diagnosis not present

## 2022-06-09 DIAGNOSIS — E11621 Type 2 diabetes mellitus with foot ulcer: Secondary | ICD-10-CM | POA: Diagnosis not present

## 2022-06-09 DIAGNOSIS — L89892 Pressure ulcer of other site, stage 2: Secondary | ICD-10-CM | POA: Diagnosis not present

## 2022-06-09 DIAGNOSIS — S92501D Displaced unspecified fracture of right lesser toe(s), subsequent encounter for fracture with routine healing: Secondary | ICD-10-CM | POA: Diagnosis not present

## 2022-06-10 ENCOUNTER — Encounter: Payer: Self-pay | Admitting: Podiatry

## 2022-06-10 ENCOUNTER — Ambulatory Visit (INDEPENDENT_AMBULATORY_CARE_PROVIDER_SITE_OTHER): Payer: Medicare Other | Admitting: Podiatry

## 2022-06-10 DIAGNOSIS — Z89421 Acquired absence of other right toe(s): Secondary | ICD-10-CM

## 2022-06-10 DIAGNOSIS — Z9889 Other specified postprocedural states: Secondary | ICD-10-CM

## 2022-06-10 NOTE — Patient Instructions (Signed)
Instructions for Wound Care  The most important step to healing a foot wound is to reduce the pressure on your foot - it is extremely important to stay off your foot as much as possible and wear the shoe/boot as instructed.  Cleanse your foot with saline wash or warm soapy water (dial antibacterial soap or similar).  Blot dry.  Apply betadine to gauze. Apply gauze to wound. Wrap with dry gauze roll and ace wrap. May hold bandage in place with Coban (self sticky wrap), Ace bandage or tape.  You may find dressing supplies at your local [employer], [employer], drug store or medical supply store.  Monitor for any signs/symptoms of infection. If there is any increase in redness, red streaks, increase in drainage, warmth to your foot please give [place] a call. Also, if you start to run a fever or have "flu-like" symptoms that can also be a sign of infection. Call the office immediately if any occur or go directly to the emergency room.

## 2022-06-10 NOTE — Progress Notes (Signed)
  Subjective:  Patient ID: Wayne [name], male    DOB: Mar 13, 1941,  MRN: [id number]  Chief Complaint  Patient presents with   Follow-up    Patient states that he has been doing good since his hospital visit.  Pain is 0/10.0.    DOS: 05/25/2022 Procedure: Partial fifth ray amputation right foot  82 y.o. male returns for post-op check.  Patient has been doing all right since hospitalization.  Following up 2 weeks post partial fifth ray amputation right foot.  He had a soft tissue deficit in the wound.  Patient does have severe peripheral arterial disease limiting options for his right foot.  He has home care nurses that have been doing every other day dressing changes  Review of Systems: Negative except as noted in the HPI. Denies N/V/F/Ch.   Objective:  There were no vitals filed for this visit. There is no height or weight on file to calculate BMI. Constitutional Well developed. Well nourished.  Vascular Foot warm and well perfused. Capillary refill normal to all digits.  Calf is soft and supple, no posterior calf or knee pain, negative Homans' sign  Neurologic Normal speech. Oriented to person, place, and time. Epicritic sensation to light touch grossly present bilaterally.  Dermatologic Mild necrosis at the distal aspect of the amputation site The previously placed graft is completely desiccated.  Underlying there is a fibrotic ulceration. Evidence of nonhealing of the ulceration. Suture line is intact with no evidence of dehiscence.  No significant maceration erythema or drainage from the incision line      Orthopedic: nontenderness to palpation noted about the surgical site.   Multiple view plain film radiographs: Deferred Assessment:   1. Post-operative state   2. 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  S/p foot surgery right partial fifth ray  amputation -Progressing postop however some concern of nonhealing at the distal aspect of the amputation site were grafted previously in place.  Patient does have severe PAD which is contributing to this nonhealing -XR: Deferred -WB Status: Weightbearing as tolerated in postop shoe -Sutures: To remain intact until next visit. -Medications: Continue antibiotics per infectious disease.  Concern for possible drug rash on the patient's back.  Encouraged them to call neck infectious disease to be seen in the next couple days so that they can be evaluated for this discontinue antibiotics if worsening -Foot redressed with Betadine gauze dressing  Return in about 1 week (around 06/17/2022) for Second postop visit partial fifth ray amputation right foot.         Everitt Amber, DPM [facility]

## 2022-06-11 DIAGNOSIS — E11621 Type 2 diabetes mellitus with foot ulcer: Secondary | ICD-10-CM | POA: Diagnosis not present

## 2022-06-11 DIAGNOSIS — S32019D Unspecified fracture of first lumbar vertebra, subsequent encounter for fracture with routine healing: Secondary | ICD-10-CM | POA: Diagnosis not present

## 2022-06-11 DIAGNOSIS — L97511 Non-pressure chronic ulcer of other part of right foot limited to breakdown of skin: Secondary | ICD-10-CM | POA: Diagnosis not present

## 2022-06-11 DIAGNOSIS — L89892 Pressure ulcer of other site, stage 2: Secondary | ICD-10-CM | POA: Diagnosis not present

## 2022-06-11 DIAGNOSIS — S92501D Displaced unspecified fracture of right lesser toe(s), subsequent encounter for fracture with routine healing: Secondary | ICD-10-CM | POA: Diagnosis not present

## 2022-06-15 DIAGNOSIS — L89892 Pressure ulcer of other site, stage 2: Secondary | ICD-10-CM | POA: Diagnosis not present

## 2022-06-15 DIAGNOSIS — L97511 Non-pressure chronic ulcer of other part of right foot limited to breakdown of skin: Secondary | ICD-10-CM | POA: Diagnosis not present

## 2022-06-15 DIAGNOSIS — S32019D Unspecified fracture of first lumbar vertebra, subsequent encounter for fracture with routine healing: Secondary | ICD-10-CM | POA: Diagnosis not present

## 2022-06-15 DIAGNOSIS — E11621 Type 2 diabetes mellitus with foot ulcer: Secondary | ICD-10-CM | POA: Diagnosis not present

## 2022-06-15 DIAGNOSIS — S92501D Displaced unspecified fracture of right lesser toe(s), subsequent encounter for fracture with routine healing: Secondary | ICD-10-CM | POA: Diagnosis not present

## 2022-06-17 ENCOUNTER — Encounter: Payer: Medicare Other | Admitting: Podiatry

## 2022-06-17 ENCOUNTER — Ambulatory Visit: Payer: Medicare Other | Admitting: Podiatry

## 2022-06-17 ENCOUNTER — Ambulatory Visit (INDEPENDENT_AMBULATORY_CARE_PROVIDER_SITE_OTHER): Payer: Medicare Other

## 2022-06-17 ENCOUNTER — Encounter: Payer: Self-pay | Admitting: Podiatry

## 2022-06-17 DIAGNOSIS — Z9889 Other specified postprocedural states: Secondary | ICD-10-CM

## 2022-06-17 DIAGNOSIS — I739 Peripheral vascular disease, unspecified: Secondary | ICD-10-CM

## 2022-06-17 DIAGNOSIS — M86071 Acute hematogenous osteomyelitis, right ankle and foot: Secondary | ICD-10-CM

## 2022-06-17 DIAGNOSIS — L97512 Non-pressure chronic ulcer of other part of right foot with fat layer exposed: Secondary | ICD-10-CM

## 2022-06-17 NOTE — Patient Instructions (Signed)
Clean the wound with saline. On the wound part put a small amount of Prisma (collagen) and wet with a little saline. On the incision where there are the sutures you can put a small amount of betadine. Cover with gauze and wrap the foot. Change the dressing every other day.   I have put in a referral for the wound care center.  Follow-up with Infectious Disease next week.   Monitor for any signs/symptoms of infection. Call the office immediately if any occur or go directly to the emergency room. Call with any questions/concerns.  Let me know if you need anything.

## 2022-06-17 NOTE — Progress Notes (Signed)
  Subjective:  Patient ID: Wayne [name], male    DOB: 10/26/1940,  MRN: [id number]  Chief Complaint  Patient presents with   Post-op Follow-up    Follow up after sx 05/23/22, no pain    DOS: 05/25/2022 Procedure: Partial fifth ray amputation right foot  82 y.o. male returns for post-op check.  If continue with dressing changes on a regular basis.  Does not report any fevers or chills he is on antibiotics per infectious disease.  Has a follow-up with see them next week as well.  Objective:  General: AAO x3, NAD  Dermatological: Incision over the proximal aspect of the scalp with sutures intact.  Minimal clear drainage expressed distally but there is no frank purulence.  Ulceration still remains along the lateral aspect of the foot on the fourth metatarsal head which appears about the same in size.  There is no probing to bone although this close to the fourth met.  Operative erythema or any ascending cellulitis.  No fluctuance or crepitation.  There is no malodor.  Vascular: Pulses decreased-foot appears to be warm and perfused.  Calf is supple.  Impression erythema warmth.  Neruologic: Sensation decreased  Musculoskeletal: Partial fifth ray amputation.  No pain on exam    Gait: Unassisted, Nonantalgic.   Assessment:   1. Post-operative state   2. 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  S/p foot surgery right partial fifth ray amputation -X-rays were obtained reviewed.  3 views of the foot were obtained.  Status post partial fifth ray potation.  Amputation resection margin appears to be sharp.  Radiolucency just adjacent to the fourth digit consistent with the ulceration.  There is no soft tissue edema. -Wound is stable however it still remains without significant improvement.  He has a severe PAD which is contributing to this.  I will refer to the wound care center to see  if is any other options to help facilitate wound healing.  For now and switch to using Prisma on the wound.  He can continue Betadine on the incision.  Applied Prisma followed by dry dressing.  Close instruction provided to the patient's family.  -WB Status: Weightbearing as tolerated in postop shoe although limited -Medications: Continue antibiotics per infectious disease.  He has a follow-up scheduled for next week.  Trula Slade DPM

## 2022-06-18 ENCOUNTER — Ambulatory Visit: Payer: Medicare Other | Admitting: Family Medicine

## 2022-06-18 DIAGNOSIS — E11621 Type 2 diabetes mellitus with foot ulcer: Secondary | ICD-10-CM | POA: Diagnosis not present

## 2022-06-18 DIAGNOSIS — L97511 Non-pressure chronic ulcer of other part of right foot limited to breakdown of skin: Secondary | ICD-10-CM | POA: Diagnosis not present

## 2022-06-18 DIAGNOSIS — S32019D Unspecified fracture of first lumbar vertebra, subsequent encounter for fracture with routine healing: Secondary | ICD-10-CM | POA: Diagnosis not present

## 2022-06-18 DIAGNOSIS — L89892 Pressure ulcer of other site, stage 2: Secondary | ICD-10-CM | POA: Diagnosis not present

## 2022-06-18 DIAGNOSIS — S92501D Displaced unspecified fracture of right lesser toe(s), subsequent encounter for fracture with routine healing: Secondary | ICD-10-CM | POA: Diagnosis not present

## 2022-06-20 ENCOUNTER — Encounter: Payer: Self-pay | Admitting: Podiatry

## 2022-06-21 ENCOUNTER — Encounter: Payer: Self-pay | Admitting: Podiatry

## 2022-06-22 ENCOUNTER — Other Ambulatory Visit: Payer: Self-pay

## 2022-06-22 ENCOUNTER — Encounter: Payer: Self-pay | Admitting: Internal Medicine

## 2022-06-22 ENCOUNTER — Ambulatory Visit: Payer: Medicare Other | Admitting: Internal Medicine

## 2022-06-22 VITALS — BP 151/77 | HR 57 | Temp 97.6°F

## 2022-06-22 DIAGNOSIS — M86171 Other acute osteomyelitis, right ankle and foot: Secondary | ICD-10-CM | POA: Diagnosis not present

## 2022-06-22 DIAGNOSIS — I739 Peripheral vascular disease, unspecified: Secondary | ICD-10-CM

## 2022-06-22 DIAGNOSIS — E1151 Type 2 diabetes mellitus with diabetic peripheral angiopathy without gangrene: Secondary | ICD-10-CM | POA: Diagnosis not present

## 2022-06-22 MED ORDER — CIPROFLOXACIN HCL 750 MG PO TABS: 750.0000 mg | ORAL_TABLET | Freq: Two times a day (BID) | ORAL | 0 refills | Status: DC

## 2022-06-22 MED ORDER — AMOXICILLIN-POT CLAVULANATE 875-125 MG PO TABS: 1.0000 | ORAL_TABLET | Freq: Two times a day (BID) | ORAL | 0 refills | Status: DC

## 2022-06-22 NOTE — Progress Notes (Signed)
[facility]  CHIEF COMPLAINT:    Follow up for foot wound  SUBJECTIVE:    Wayne [name] is a 82 y.o. male with PMHx as below who presents to the clinic for foot wound.   Patient was recently admitted at [facility] 2/22-2/29/24 and discharged home with home health at that time.  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  He had another I&D on 2/27 where bone cultures were finalized as negative and pathology suggested bony regeneration and no acute OM noted.  His other surgical culture from 2/25 were polymicrobial with Enterococcus faecalis, Pseudomonas aeruiginosa, Prevotella.  Given the minor amputation with what appeared to be adequate source control, he was discharged on Ciprofloxacin 750mg  PO BID and Augmentin 875mg  PO BID x 4 weeks through 06/22/22.  He has also been following with podiatry since discharge.  Seen 3/21 by Dr Jacqualyn Posey.  Noted to have some clear drainage from incision but no purulence.  Ulceration also noted about the 4th metatarsal with no probe to bone.  He was referred to the wound center to help with management of this.  Unfortunately, they can not get in until the end of April at this time.  They have been trying to keep him from putting too much pressure on his wound and getting him to pivot on his heel.  This has been challenging at times particularly if using the bathroom.  They have also been doing dressing changes and wound care at home per the Podiatry team recommendations.   He also has PAD and has seen Vascular Surgery previously with last visit in September 2023.  At that time, there were no further options available to improve his blood flow situation.   Please see A&P for the details of today's visit and status of the patient's medical problems.   Patient's Medications  New  Prescriptions   No medications on file  Previous Medications   ACCU-CHEK SOFTCLIX LANCETS LANCETS    Use 4 times daily as directed to check blood sugars.   ACETAMINOPHEN (TYLENOL) 500 MG TABLET    Take 2 tablets (1,000 mg total) by mouth every 8 (eight) hours.   ASCORBIC ACID (VITAMIN C PO)    Take 1 tablet by mouth daily.   BLOOD GLUCOSE METER KIT AND SUPPLIES KIT    Dispense based on patient and insurance preference. Use up to four times daily as directed.   BLOOD GLUCOSE METER KIT AND SUPPLIES KIT    Use up to four times daily as directed.   CICLOPIROX (PENLAC) 8 % SOLUTION    Apply topically at bedtime. Apply over nail and surrounding skin. Apply daily over previous coat. After seven (7) days, may remove with alcohol and continue cycle.   COLLAGENASE (SANTYL) 250 UNIT/GM OINTMENT    Apply 1 Application topically daily.   DAPAGLIFLOZIN PROPANEDIOL (FARXIGA) 10 MG TABS TABLET    Take 1 tablet (10 mg total) by mouth daily.   FEEDING SUPPLEMENT (ENSURE ENLIVE / ENSURE PLUS) LIQD    Take 237 mLs by mouth 2 (two) times daily between meals.   FUROSEMIDE (LASIX) 40 MG TABLET    Take 1 tablet (40 mg total) by mouth daily.   GLIPIZIDE (GLUCOTROL) 5 MG TABLET    TAKE 1 TABLET BY MOUTH DAILY BEFORE BREAKFAST.   GLUCOSE BLOOD TEST STRIP  Use as instructed   GLUCOSE BLOOD TEST STRIP    Use 4 times daily as directed to check bloos sugars.   METOPROLOL SUCCINATE (TOPROL-XL) 25 MG 24 HR TABLET    TAKE 1/2 TABLET BY MOUTH EVERY DAY   MULTIPLE VITAMINS-MINERALS (ZINC PO)    Take 1 tablet by mouth daily.   POLYETHYLENE GLYCOL (MIRALAX / GLYCOLAX) 17 G PACKET    Take 17 g by mouth daily as needed for moderate constipation.   SACCHAROMYCES BOULARDII (FLORASTOR) 250 MG CAPSULE    Take 1 capsule (250 mg total) by mouth 2 (two) times daily for 28 days.   SENNA-DOCUSATE (SENOKOT-S) 8.6-50 MG TABLET    Take 1 tablet by mouth 2 (two) times daily.   SPECIALTY VITAMINS PRODUCTS (BIOTIN PLUS KERATIN PO)    Take 1 tablet  by mouth daily.   TAMSULOSIN (FLOMAX) 0.4 MG CAPS CAPSULE    Take 1 capsule (0.4 mg total) by mouth daily after breakfast.   VITAMIN D, ERGOCALCIFEROL, (DRISDOL) 1.25 MG (50000 UNIT) CAPS CAPSULE    Take 1 capsule (50,000 Units total) by mouth every 7 (seven) days.   VITAMIN E PO    Take 1 tablet by mouth daily.  Modified Medications   Modified Medication Previous Medication   AMOXICILLIN-CLAVULANATE (AUGMENTIN) 875-125 MG TABLET amoxicillin-clavulanate (AUGMENTIN) 875-125 MG tablet      Take 1 tablet by mouth 2 (two) times daily for 28 days.    Take 1 tablet by mouth 2 (two) times daily for 26 days.   CIPROFLOXACIN (CIPRO) 750 MG TABLET ciprofloxacin (CIPRO) 750 MG tablet      Take 1 tablet (750 mg total) by mouth 2 (two) times daily for 28 days.    Take 1 tablet (750 mg total) by mouth 2 (two) times daily for 26 days.  Discontinued Medications   No medications on file      Past Medical History:  Diagnosis Date   Anemia of chronic disease    Chronic HFrEF (heart failure with reduced ejection fraction) (HCC)    Chronic kidney disease, stage 3a (HCC)    Coronary artery disease    Diabetes mellitus with circulatory complication (HCC)    Diabetic foot infection ([place])    Encephalopathy    Former tobacco use    PAD (peripheral artery disease) (HCC)    Peripheral neuropathy    SDH (subdural hematoma) (HCC)    Severe pulmonary hypertension (HCC)     Social History   Tobacco Use   Smoking status: Former    Types: Cigarettes    Quit date: 1993    Years since quitting: 31.2   Smokeless tobacco: Never  Vaping Use   Vaping Use: Never used  Substance Use Topics   Alcohol use: No   Drug use: No    Family History  Problem Relation Age of Onset   Cervical cancer Mother    Down syndrome Sister    Diabetes type II Neg Hx     No Known Allergies  Review of Systems  Constitutional: Negative.   Gastrointestinal: Negative.      OBJECTIVE:    Vitals:   06/22/22 1026  BP: (!)  151/77  Pulse: (!) 57  Temp: 97.6 F (36.4 C)  TempSrc: Oral  SpO2: 98%   There is no height or weight on file to calculate BMI.  Physical Exam Constitutional:      General: He is not in acute distress.    Appearance: Normal appearance.  HENT:  Head: Normocephalic and atraumatic.  Eyes:     Extraocular Movements: Extraocular movements intact.     Conjunctiva/sclera: Conjunctivae normal.  Abdominal:     General: There is no distension.     Palpations: Abdomen is soft.  Musculoskeletal:     Comments: Right foot with changes of PAD.  5th toe amputation area with sutures in place.  No active drainage but more distally towards the 4th metatarsal there is ongoing ulceration and possibly non-viable tissues.   Skin:    General: Skin is warm and dry.  Neurological:     Mental Status: He is alert.  Psychiatric:        Mood and Affect: Mood normal.        Behavior: Behavior normal.      Labs and Microbiology:    Latest Ref Rng & Units 05/29/2022    4:27 AM 05/28/2022   10:38 AM 05/26/2022   12:32 AM  CBC  WBC 4.0 - 10.5 K/uL 11.1  9.6  9.0   Hemoglobin 13.0 - 17.0 g/dL 12.7  13.3  12.5   Hematocrit 39.0 - 52.0 % 38.2  42.5  38.4   Platelets 150 - 400 K/uL 291  304  260       Latest Ref Rng & Units 05/29/2022    4:27 AM 05/28/2022   10:38 AM 05/26/2022   12:32 AM  CMP  Glucose 70 - 99 mg/dL 155  177  216   BUN 8 - 23 mg/dL 21  18  29    Creatinine 0.61 - 1.24 mg/dL 1.56  1.39  1.33   Sodium 135 - 145 mmol/L 140  139  138   Potassium 3.5 - 5.1 mmol/L 4.2  4.3  4.4   Chloride 98 - 111 mmol/L 105  106  109   CO2 22 - 32 mmol/L 18  16  16    Calcium 8.9 - 10.3 mg/dL 8.6  8.5  8.3   Total Protein 6.5 - 8.1 g/dL 5.6     Total Bilirubin 0.3 - 1.2 mg/dL 1.1     Alkaline Phos 38 - 126 U/L 249     AST 15 - 41 U/L 24     ALT 0 - 44 U/L 22        No results found for this or any previous visit (from the past 240 hour(s)).     ASSESSMENT & PLAN:    Acute osteomyelitis of right  foot [facility]) Patient here for follow up of right foot deep infection including osteomyelitis s/p 5th ray amputation on 05/23/22.  This is complicated by 4th metatarsal ulceration in the setting of PAD that has been optimized from a vascular perspective.  He is completing 4 weeks of Cipro and Augmentin in light of recent minor amputation.  I think he is at very high risk of needing further amputation in the future and not entirely sure further antibiotics will be curative in this situation, however, he and his family want to exhaust their efforts medically before pursuing a more proximal amputation.  For now, will provide an extension of ciprofloxacin 750mg  BID and Augmentin 875mg  BID x 4 more weeks to complete 8 weeks total from his surgery.  Will also check ESR, CRP, CBC today.  Continue to follow with wound care and podiatry.  Will see back in about 4 weeks at end of therapy.  At that time, will either stop antibiotics or try to place on a more suppressive regimen but the utility of  this approach is certainly up for debate.    PAD (peripheral artery disease) ([place]) He has seen [facility]VS in the past and optimized at this time from a blood flow perspective.   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    Orders Placed This Encounter  Procedures   Sedimentation rate   C-reactive protein   CBC        [facility] 06/22/2022, 11:05 AM

## 2022-06-22 NOTE — Assessment & Plan Note (Signed)
Most recent A1c in February 2024 was improved from prior.

## 2022-06-22 NOTE — Assessment & Plan Note (Signed)
He has seen [facility] in the past and optimized at this time from a blood flow perspective.

## 2022-06-22 NOTE — Telephone Encounter (Signed)
I spoke to the patient's daughter and she would like for the patient to try the trial of Valtrex first to see if it improves with that. Please send to the [facility] in [place] listed on his chart.  Dewanda Fennema T Brooks Sailors

## 2022-06-22 NOTE — Assessment & Plan Note (Signed)
Patient here for follow up of right foot deep infection including osteomyelitis s/p 5th ray amputation on 05/23/22.  This is complicated by 4th metatarsal ulceration in the setting of PAD that has been optimized from a vascular perspective.  He is completing 4 weeks of Cipro and Augmentin in light of recent minor amputation.  I think he is at very high risk of needing further amputation in the future and not entirely sure further antibiotics will be curative in this situation, however, he and his family want to exhaust their efforts medically before pursuing a more proximal amputation.  For now, will provide an extension of ciprofloxacin 750mg  BID and Augmentin 875mg  BID x 4 more weeks to complete 8 weeks total from his surgery.  Will also check ESR, CRP, CBC today.  Continue to follow with wound care and podiatry.  Will see back in about 4 weeks at end of therapy.  At that time, will either stop antibiotics or try to place on a more suppressive regimen but the utility of this approach is certainly up for debate.

## 2022-06-23 ENCOUNTER — Other Ambulatory Visit: Payer: Self-pay | Admitting: Internal Medicine

## 2022-06-23 ENCOUNTER — Other Ambulatory Visit: Payer: Self-pay

## 2022-06-23 ENCOUNTER — Encounter: Payer: Self-pay | Admitting: Internal Medicine

## 2022-06-23 DIAGNOSIS — M86171 Other acute osteomyelitis, right ankle and foot: Secondary | ICD-10-CM

## 2022-06-23 DIAGNOSIS — E11621 Type 2 diabetes mellitus with foot ulcer: Secondary | ICD-10-CM | POA: Diagnosis not present

## 2022-06-23 DIAGNOSIS — L89892 Pressure ulcer of other site, stage 2: Secondary | ICD-10-CM | POA: Diagnosis not present

## 2022-06-23 DIAGNOSIS — S32019D Unspecified fracture of first lumbar vertebra, subsequent encounter for fracture with routine healing: Secondary | ICD-10-CM | POA: Diagnosis not present

## 2022-06-23 DIAGNOSIS — S92501D Displaced unspecified fracture of right lesser toe(s), subsequent encounter for fracture with routine healing: Secondary | ICD-10-CM | POA: Diagnosis not present

## 2022-06-23 DIAGNOSIS — L97511 Non-pressure chronic ulcer of other part of right foot limited to breakdown of skin: Secondary | ICD-10-CM | POA: Diagnosis not present

## 2022-06-23 LAB — CBC
HCT: 41.9 % (ref 38.5–50.0)
Hemoglobin: 14 g/dL (ref 13.2–17.1)
MCH: 28.3 pg (ref 27.0–33.0)
MCHC: 33.4 g/dL (ref 32.0–36.0)
MCV: 84.8 fL (ref 80.0–100.0)
MPV: 10.9 fL (ref 7.5–12.5)
Platelets: 138 10*3/uL — ABNORMAL LOW (ref 140–400)
RBC: 4.94 10*6/uL (ref 4.20–5.80)
RDW: 14.5 % (ref 11.0–15.0)
WBC: 4.9 10*3/uL (ref 3.8–10.8)

## 2022-06-23 LAB — SEDIMENTATION RATE: Sed Rate: 38 mm/h — ABNORMAL HIGH (ref 0–20)

## 2022-06-23 LAB — C-REACTIVE PROTEIN: CRP: 3.5 mg/L (ref <8.0)

## 2022-06-23 MED ORDER — VALACYCLOVIR HCL 1 G PO TABS: 1000.0000 mg | ORAL_TABLET | Freq: Two times a day (BID) | ORAL | 0 refills | Status: AC

## 2022-06-23 NOTE — Telephone Encounter (Signed)
Patient's daughter informed and verbalized understanding. Patient scheduled for a lab appointment and labs ordered

## 2022-06-23 NOTE — Addendum Note (Signed)
Addended by: Daisy Floro T on: 06/23/2022 08:57 AM   Modules accepted: Orders

## 2022-06-24 DIAGNOSIS — S92501D Displaced unspecified fracture of right lesser toe(s), subsequent encounter for fracture with routine healing: Secondary | ICD-10-CM | POA: Diagnosis not present

## 2022-06-24 DIAGNOSIS — S32019D Unspecified fracture of first lumbar vertebra, subsequent encounter for fracture with routine healing: Secondary | ICD-10-CM | POA: Diagnosis not present

## 2022-06-24 DIAGNOSIS — E11621 Type 2 diabetes mellitus with foot ulcer: Secondary | ICD-10-CM | POA: Diagnosis not present

## 2022-06-24 DIAGNOSIS — L89892 Pressure ulcer of other site, stage 2: Secondary | ICD-10-CM | POA: Diagnosis not present

## 2022-06-24 DIAGNOSIS — L97511 Non-pressure chronic ulcer of other part of right foot limited to breakdown of skin: Secondary | ICD-10-CM | POA: Diagnosis not present

## 2022-06-25 ENCOUNTER — Other Ambulatory Visit: Payer: Self-pay | Admitting: Podiatry

## 2022-06-25 DIAGNOSIS — L89892 Pressure ulcer of other site, stage 2: Secondary | ICD-10-CM | POA: Diagnosis not present

## 2022-06-25 DIAGNOSIS — E1159 Type 2 diabetes mellitus with other circulatory complications: Secondary | ICD-10-CM | POA: Diagnosis not present

## 2022-06-25 DIAGNOSIS — E440 Moderate protein-calorie malnutrition: Secondary | ICD-10-CM | POA: Diagnosis not present

## 2022-06-25 DIAGNOSIS — S92501D Displaced unspecified fracture of right lesser toe(s), subsequent encounter for fracture with routine healing: Secondary | ICD-10-CM | POA: Diagnosis not present

## 2022-06-25 DIAGNOSIS — L97512 Non-pressure chronic ulcer of other part of right foot with fat layer exposed: Secondary | ICD-10-CM

## 2022-06-25 DIAGNOSIS — N1831 Chronic kidney disease, stage 3a: Secondary | ICD-10-CM | POA: Diagnosis not present

## 2022-06-25 DIAGNOSIS — I872 Venous insufficiency (chronic) (peripheral): Secondary | ICD-10-CM | POA: Diagnosis not present

## 2022-06-25 DIAGNOSIS — S32019D Unspecified fracture of first lumbar vertebra, subsequent encounter for fracture with routine healing: Secondary | ICD-10-CM | POA: Diagnosis not present

## 2022-06-25 DIAGNOSIS — L97511 Non-pressure chronic ulcer of other part of right foot limited to breakdown of skin: Secondary | ICD-10-CM | POA: Diagnosis not present

## 2022-06-25 DIAGNOSIS — M869 Osteomyelitis, unspecified: Secondary | ICD-10-CM | POA: Diagnosis not present

## 2022-06-25 DIAGNOSIS — E11621 Type 2 diabetes mellitus with foot ulcer: Secondary | ICD-10-CM | POA: Diagnosis not present

## 2022-06-30 ENCOUNTER — Telehealth: Payer: Self-pay | Admitting: *Deleted

## 2022-06-30 DIAGNOSIS — Z89421 Acquired absence of other right toe(s): Secondary | ICD-10-CM | POA: Diagnosis not present

## 2022-06-30 DIAGNOSIS — M86161 Other acute osteomyelitis, right tibia and fibula: Secondary | ICD-10-CM | POA: Diagnosis not present

## 2022-06-30 DIAGNOSIS — E1169 Type 2 diabetes mellitus with other specified complication: Secondary | ICD-10-CM | POA: Diagnosis not present

## 2022-06-30 DIAGNOSIS — Z4781 Encounter for orthopedic aftercare following surgical amputation: Secondary | ICD-10-CM | POA: Diagnosis not present

## 2022-06-30 DIAGNOSIS — E785 Hyperlipidemia, unspecified: Secondary | ICD-10-CM | POA: Diagnosis not present

## 2022-06-30 NOTE — Telephone Encounter (Signed)
Sarah with [facility] is calling to ask if the colactive plus AG is what the doctor intended to order for the patient's open site,would like to know  before applying, is at patient's house now, please advise

## 2022-07-01 ENCOUNTER — Ambulatory Visit: Payer: Medicare Other | Admitting: Podiatry

## 2022-07-01 DIAGNOSIS — L97512 Non-pressure chronic ulcer of other part of right foot with fat layer exposed: Secondary | ICD-10-CM | POA: Diagnosis not present

## 2022-07-01 NOTE — Progress Notes (Signed)
  Subjective:  Patient ID: Wayne [name], male    DOB: 1940/07/30,  MRN: [id number]  Chief Complaint  Patient presents with   Follow-up    Follow up    DOS: 05/25/2022 Procedure: Partial fifth ray amputation right foot  82 y.o. male returns for post-op check.  Patient reports that the partial amputation site on the right foot it is looking improved.  His family reports that they have been using a collagen style dressing on the area.  Thinks it is looking healthy and improving.  Patient is  Objective:  General: AAO x3, NAD  Dermatological: Incision at the proximal aspect appears mostly healed at this time though there is fissuring present noted along the incision line.  Ulceration remains along the lateral aspect of the foot at the fourth metatarsal head.  Upon debridement this does have overall healthy granular base without significant fibrotic tissue or any purulence.  There is minimal to no erythema and no active drainage from the area.  Vascular: Pulses decreased-foot appears to be warm and perfused.  Calf is supple.  Impression erythema warmth.  Neruologic: Sensation decreased  Musculoskeletal: Partial fifth ray amputation.  No pain on exam    Gait: Unassisted, Nonantalgic.   Assessment:   1. Post-operative state   2. 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  S/p foot surgery right partial fifth ray amputation -X-rays deferred at this visit  -Debridement as below Pt remains without interval mild improvement.  He has a severe PAD which is contributing to this.   -Continue with dressing care and right foot dressing changes every second or third day with Prisma or colactive collagen dressing, 4 x 4 gauze Curlex and Ace wrap -Recommend the patient be seen by the wound care center and he does have an appointment set up for them to see him in [facility] coming up  soon. -Sutures were removed from the incision at this visit -WB Status: Weightbearing as tolerated in postop shoe although limited -Medications: Continue antibiotics per infectious disease.    Procedure: Excisional Debridement of Wound Rationale: Removal of non-viable soft tissue from the wound to promote healing.  Anesthesia: none Post-Debridement Wound Measurements: 2 cm x 1.5 cm x 0.5 cm  Type of Debridement: Sharp Excisional Tissue Removed: Non-viable soft tissue Depth of Debridement: subcutaneous tissue. Technique: Sharp excisional debridement to bleeding, viable wound base.  Dressing: Dry, sterile, compression dressing. Disposition: Patient tolerated procedure well.   Jenelle Mages Voula Waln DPM

## 2022-07-01 NOTE — Telephone Encounter (Signed)
Sarah w/ [facility] has been updated,verbalized understanding.

## 2022-07-05 ENCOUNTER — Encounter: Payer: Self-pay | Admitting: *Deleted

## 2022-07-06 DIAGNOSIS — L89892 Pressure ulcer of other site, stage 2: Secondary | ICD-10-CM | POA: Diagnosis not present

## 2022-07-08 ENCOUNTER — Ambulatory Visit (INDEPENDENT_AMBULATORY_CARE_PROVIDER_SITE_OTHER): Payer: Medicare Other

## 2022-07-08 DIAGNOSIS — E538 Deficiency of other specified B group vitamins: Secondary | ICD-10-CM

## 2022-07-08 DIAGNOSIS — Z89421 Acquired absence of other right toe(s): Secondary | ICD-10-CM | POA: Diagnosis not present

## 2022-07-08 DIAGNOSIS — E1151 Type 2 diabetes mellitus with diabetic peripheral angiopathy without gangrene: Secondary | ICD-10-CM | POA: Diagnosis not present

## 2022-07-08 DIAGNOSIS — N1831 Chronic kidney disease, stage 3a: Secondary | ICD-10-CM | POA: Diagnosis not present

## 2022-07-08 DIAGNOSIS — E1122 Type 2 diabetes mellitus with diabetic chronic kidney disease: Secondary | ICD-10-CM | POA: Diagnosis not present

## 2022-07-08 DIAGNOSIS — I5043 Acute on chronic combined systolic (congestive) and diastolic (congestive) heart failure: Secondary | ICD-10-CM

## 2022-07-08 DIAGNOSIS — E1142 Type 2 diabetes mellitus with diabetic polyneuropathy: Secondary | ICD-10-CM

## 2022-07-08 DIAGNOSIS — E1159 Type 2 diabetes mellitus with other circulatory complications: Secondary | ICD-10-CM

## 2022-07-08 DIAGNOSIS — S32019D Unspecified fracture of first lumbar vertebra, subsequent encounter for fracture with routine healing: Secondary | ICD-10-CM

## 2022-07-08 DIAGNOSIS — D631 Anemia in chronic kidney disease: Secondary | ICD-10-CM

## 2022-07-08 DIAGNOSIS — I152 Hypertension secondary to endocrine disorders: Secondary | ICD-10-CM

## 2022-07-08 DIAGNOSIS — S92501D Displaced unspecified fracture of right lesser toe(s), subsequent encounter for fracture with routine healing: Secondary | ICD-10-CM

## 2022-07-08 DIAGNOSIS — E559 Vitamin D deficiency, unspecified: Secondary | ICD-10-CM

## 2022-07-08 DIAGNOSIS — I272 Pulmonary hypertension, unspecified: Secondary | ICD-10-CM

## 2022-07-08 DIAGNOSIS — E785 Hyperlipidemia, unspecified: Secondary | ICD-10-CM

## 2022-07-08 DIAGNOSIS — Z4781 Encounter for orthopedic aftercare following surgical amputation: Secondary | ICD-10-CM | POA: Diagnosis not present

## 2022-07-08 DIAGNOSIS — E1169 Type 2 diabetes mellitus with other specified complication: Secondary | ICD-10-CM

## 2022-07-08 DIAGNOSIS — E440 Moderate protein-calorie malnutrition: Secondary | ICD-10-CM

## 2022-07-08 DIAGNOSIS — M86161 Other acute osteomyelitis, right tibia and fibula: Secondary | ICD-10-CM

## 2022-07-08 DIAGNOSIS — I70201 Unspecified atherosclerosis of native arteries of extremities, right leg: Secondary | ICD-10-CM

## 2022-07-08 DIAGNOSIS — I2510 Atherosclerotic heart disease of native coronary artery without angina pectoris: Secondary | ICD-10-CM

## 2022-07-09 ENCOUNTER — Other Ambulatory Visit: Payer: Medicare Other

## 2022-07-09 DIAGNOSIS — E1151 Type 2 diabetes mellitus with diabetic peripheral angiopathy without gangrene: Secondary | ICD-10-CM | POA: Diagnosis not present

## 2022-07-09 DIAGNOSIS — I872 Venous insufficiency (chronic) (peripheral): Secondary | ICD-10-CM | POA: Diagnosis not present

## 2022-07-09 DIAGNOSIS — L928 Other granulomatous disorders of the skin and subcutaneous tissue: Secondary | ICD-10-CM | POA: Diagnosis not present

## 2022-07-09 DIAGNOSIS — L97512 Non-pressure chronic ulcer of other part of right foot with fat layer exposed: Secondary | ICD-10-CM | POA: Diagnosis not present

## 2022-07-09 DIAGNOSIS — Z89421 Acquired absence of other right toe(s): Secondary | ICD-10-CM | POA: Diagnosis not present

## 2022-07-09 DIAGNOSIS — I70235 Atherosclerosis of native arteries of right leg with ulceration of other part of foot: Secondary | ICD-10-CM | POA: Diagnosis not present

## 2022-07-09 DIAGNOSIS — E11621 Type 2 diabetes mellitus with foot ulcer: Secondary | ICD-10-CM | POA: Diagnosis not present

## 2022-07-09 DIAGNOSIS — M868X7 Other osteomyelitis, ankle and foot: Secondary | ICD-10-CM | POA: Diagnosis not present

## 2022-07-09 DIAGNOSIS — L97412 Non-pressure chronic ulcer of right heel and midfoot with fat layer exposed: Secondary | ICD-10-CM | POA: Diagnosis not present

## 2022-07-13 ENCOUNTER — Other Ambulatory Visit: Payer: Self-pay | Admitting: Family Medicine

## 2022-07-13 DIAGNOSIS — I5043 Acute on chronic combined systolic (congestive) and diastolic (congestive) heart failure: Secondary | ICD-10-CM

## 2022-07-13 DIAGNOSIS — E1159 Type 2 diabetes mellitus with other circulatory complications: Secondary | ICD-10-CM

## 2022-07-15 ENCOUNTER — Ambulatory Visit (HOSPITAL_BASED_OUTPATIENT_CLINIC_OR_DEPARTMENT_OTHER): Payer: Medicare Other | Admitting: General Surgery

## 2022-07-15 ENCOUNTER — Ambulatory Visit: Payer: Medicare Other | Admitting: Podiatry

## 2022-07-15 DIAGNOSIS — M86071 Acute hematogenous osteomyelitis, right ankle and foot: Secondary | ICD-10-CM

## 2022-07-15 DIAGNOSIS — L97512 Non-pressure chronic ulcer of other part of right foot with fat layer exposed: Secondary | ICD-10-CM | POA: Diagnosis not present

## 2022-07-15 DIAGNOSIS — I739 Peripheral vascular disease, unspecified: Secondary | ICD-10-CM

## 2022-07-15 DIAGNOSIS — E1151 Type 2 diabetes mellitus with diabetic peripheral angiopathy without gangrene: Secondary | ICD-10-CM

## 2022-07-15 DIAGNOSIS — Z89421 Acquired absence of other right toe(s): Secondary | ICD-10-CM

## 2022-07-15 NOTE — Progress Notes (Signed)
  Subjective:  Patient ID: Wayne [name], male    DOB: May 21, 1940,  MRN: [id number]  Chief Complaint  Patient presents with   Follow-up    DOS: 05/25/2022 Procedure: Partial fifth ray amputation right foot  82 y.o. male returns for post-op check.  Patient and family present reports that the partial amputation site on the right foot is slightly improved though healing slowly.  They went to wound care center in [facility].  The doctor is recommending Hydrofera Blue dressings as well as a collagen layer underneath.  Patient is still taking antibiotics per infectious disease.  No nausea vomiting fever chills.  Minimal drainage from site.  Objective:  General: AAO x3, NAD  Dermatological: Incision at the proximal aspect appears mostly healed at this time though there continues to be fissuring present noted along the incision line without true dehiscence though the wound is not fully healing together.  Ulceration remains along the lateral aspect of the foot at the fourth metatarsal head.  Upon debridement this does have overall healthy granular base without significant fibrotic tissue or any purulence.  Hyperkeratotic tissue around the wound.  There is minimal to no erythema and no active drainage from the area.  Overall seems to be slightly improved from last visit   Vascular: Pulses decreased-foot appears to be warm and perfused.  Calf is supple.  Impression erythema warmth.  Neruologic: Sensation decreased  Musculoskeletal: Partial fifth ray amputation.  No pain on exam  Gait: Unassisted, Nonantalgic.   Assessment:   1. Ulcer of right foot with fat layer exposed   2. Toe ulcer, right, with fat layer exposed   3. Acute hematogenous osteomyelitis of right foot   4. PAD (peripheral artery disease)   5. 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  S/p foot surgery right partial fifth ray amputation -X-rays deferred at this visit  -Debridement as below Pt remains without interval mild improvement.  He has a severe PAD which is contributing to this.   -Continue with dressing care and right foot dressing changes every second or third day with Prisma or colactive collagen dressing, 4 x 4 gauze Curlex and Ace wrap -Continue following with [place] wound care center outside facility.  Follow their recommendations for wound care -WB Status: Weightbearing as tolerated in postop shoe  -Medications: Continue antibiotics per infectious disease.   -Overall the wound is making slow progress.  Discussed there is no evidence of infection at this time.  Continue with routine debridements routine wound care appointments and hopefully over a long period of time the wound will heal in.  If there becomes a secondary infection more surgery will have to be considered and hopefully Sunday that could be closed at a level with adequate vascular healing.  Procedure: Excisional Debridement of Wound Rationale: Removal of non-viable soft tissue from the wound to promote healing.  Anesthesia: none Post-Debridement Wound Measurements: 1.8 cm x 1.4 cm x 0.3 cm.  Type of Debridement: Sharp Excisional Tissue Removed: Non-viable soft tissue Depth of Debridement: subcutaneous tissue. Technique: Sharp excisional debridement to bleeding, viable wound base.  Dressing: Dry, sterile, compression dressing. Disposition: Patient tolerated procedure well.   Jenelle Mages Kyung Muto DPM

## 2022-07-16 ENCOUNTER — Other Ambulatory Visit: Payer: Medicare Other

## 2022-07-16 DIAGNOSIS — L97412 Non-pressure chronic ulcer of right heel and midfoot with fat layer exposed: Secondary | ICD-10-CM | POA: Diagnosis not present

## 2022-07-16 DIAGNOSIS — L97512 Non-pressure chronic ulcer of other part of right foot with fat layer exposed: Secondary | ICD-10-CM | POA: Diagnosis not present

## 2022-07-16 DIAGNOSIS — E11621 Type 2 diabetes mellitus with foot ulcer: Secondary | ICD-10-CM | POA: Diagnosis not present

## 2022-07-16 DIAGNOSIS — L928 Other granulomatous disorders of the skin and subcutaneous tissue: Secondary | ICD-10-CM | POA: Diagnosis not present

## 2022-07-16 DIAGNOSIS — Z89421 Acquired absence of other right toe(s): Secondary | ICD-10-CM | POA: Diagnosis not present

## 2022-07-16 DIAGNOSIS — E1151 Type 2 diabetes mellitus with diabetic peripheral angiopathy without gangrene: Secondary | ICD-10-CM | POA: Diagnosis not present

## 2022-07-16 DIAGNOSIS — I739 Peripheral vascular disease, unspecified: Secondary | ICD-10-CM | POA: Diagnosis not present

## 2022-07-16 DIAGNOSIS — M868X7 Other osteomyelitis, ankle and foot: Secondary | ICD-10-CM | POA: Diagnosis not present

## 2022-07-19 DIAGNOSIS — Z4781 Encounter for orthopedic aftercare following surgical amputation: Secondary | ICD-10-CM | POA: Diagnosis not present

## 2022-07-19 DIAGNOSIS — E785 Hyperlipidemia, unspecified: Secondary | ICD-10-CM | POA: Diagnosis not present

## 2022-07-19 DIAGNOSIS — Z89421 Acquired absence of other right toe(s): Secondary | ICD-10-CM | POA: Diagnosis not present

## 2022-07-19 DIAGNOSIS — E1169 Type 2 diabetes mellitus with other specified complication: Secondary | ICD-10-CM | POA: Diagnosis not present

## 2022-07-19 DIAGNOSIS — M86161 Other acute osteomyelitis, right tibia and fibula: Secondary | ICD-10-CM | POA: Diagnosis not present

## 2022-07-20 ENCOUNTER — Other Ambulatory Visit: Payer: Self-pay

## 2022-07-20 ENCOUNTER — Ambulatory Visit: Payer: Medicare Other | Admitting: Internal Medicine

## 2022-07-20 ENCOUNTER — Other Ambulatory Visit: Payer: Medicare Other

## 2022-07-20 DIAGNOSIS — M86171 Other acute osteomyelitis, right ankle and foot: Secondary | ICD-10-CM | POA: Diagnosis not present

## 2022-07-21 LAB — BASIC METABOLIC PANEL
BUN/Creatinine Ratio: 31 (calc) — ABNORMAL HIGH (ref 6–22)
BUN: 42 mg/dL — ABNORMAL HIGH (ref 7–25)
CO2: 25 mmol/L (ref 20–32)
Calcium: 9.1 mg/dL (ref 8.6–10.3)
Chloride: 105 mmol/L (ref 98–110)
Creat: 1.37 mg/dL — ABNORMAL HIGH (ref 0.70–1.22)
Glucose, Bld: 189 mg/dL — ABNORMAL HIGH (ref 65–99)
Potassium: 4.6 mmol/L (ref 3.5–5.3)
Sodium: 137 mmol/L (ref 135–146)

## 2022-07-21 LAB — CBC WITH DIFFERENTIAL/PLATELET
Absolute Monocytes: 429 cells/uL (ref 200–950)
Basophils Absolute: 41 cells/uL (ref 0–200)
Basophils Relative: 0.7 %
Eosinophils Absolute: 249 cells/uL (ref 15–500)
Eosinophils Relative: 4.3 %
HCT: 42 % (ref 38.5–50.0)
Hemoglobin: 14.3 g/dL (ref 13.2–17.1)
Lymphs Abs: 1839 cells/uL (ref 850–3900)
MCH: 28.3 pg (ref 27.0–33.0)
MCHC: 34 g/dL (ref 32.0–36.0)
MCV: 83 fL (ref 80.0–100.0)
MPV: 11 fL (ref 7.5–12.5)
Monocytes Relative: 7.4 %
Neutro Abs: 3242 cells/uL (ref 1500–7800)
Neutrophils Relative %: 55.9 %
Platelets: 158 10*3/uL (ref 140–400)
RBC: 5.06 10*6/uL (ref 4.20–5.80)
RDW: 14.7 % (ref 11.0–15.0)
Total Lymphocyte: 31.7 %
WBC: 5.8 10*3/uL (ref 3.8–10.8)

## 2022-07-21 LAB — SEDIMENTATION RATE: Sed Rate: 31 mm/h — ABNORMAL HIGH (ref 0–20)

## 2022-07-21 LAB — C-REACTIVE PROTEIN: CRP: 18.5 mg/L — ABNORMAL HIGH (ref <8.0)

## 2022-07-22 DIAGNOSIS — S91301A Unspecified open wound, right foot, initial encounter: Secondary | ICD-10-CM | POA: Diagnosis not present

## 2022-07-22 DIAGNOSIS — E11621 Type 2 diabetes mellitus with foot ulcer: Secondary | ICD-10-CM | POA: Diagnosis not present

## 2022-07-23 ENCOUNTER — Ambulatory Visit: Payer: Medicare Other | Admitting: Family

## 2022-07-23 DIAGNOSIS — L928 Other granulomatous disorders of the skin and subcutaneous tissue: Secondary | ICD-10-CM | POA: Diagnosis not present

## 2022-07-23 DIAGNOSIS — I739 Peripheral vascular disease, unspecified: Secondary | ICD-10-CM | POA: Diagnosis not present

## 2022-07-23 DIAGNOSIS — M868X7 Other osteomyelitis, ankle and foot: Secondary | ICD-10-CM | POA: Diagnosis not present

## 2022-07-23 DIAGNOSIS — E11621 Type 2 diabetes mellitus with foot ulcer: Secondary | ICD-10-CM | POA: Diagnosis not present

## 2022-07-23 DIAGNOSIS — L97412 Non-pressure chronic ulcer of right heel and midfoot with fat layer exposed: Secondary | ICD-10-CM | POA: Diagnosis not present

## 2022-07-23 DIAGNOSIS — E1151 Type 2 diabetes mellitus with diabetic peripheral angiopathy without gangrene: Secondary | ICD-10-CM | POA: Diagnosis not present

## 2022-07-23 DIAGNOSIS — Z89421 Acquired absence of other right toe(s): Secondary | ICD-10-CM | POA: Diagnosis not present

## 2022-07-23 DIAGNOSIS — L97512 Non-pressure chronic ulcer of other part of right foot with fat layer exposed: Secondary | ICD-10-CM | POA: Diagnosis not present

## 2022-07-26 DIAGNOSIS — E440 Moderate protein-calorie malnutrition: Secondary | ICD-10-CM | POA: Diagnosis not present

## 2022-07-26 DIAGNOSIS — E1159 Type 2 diabetes mellitus with other circulatory complications: Secondary | ICD-10-CM | POA: Diagnosis not present

## 2022-07-26 DIAGNOSIS — M869 Osteomyelitis, unspecified: Secondary | ICD-10-CM | POA: Diagnosis not present

## 2022-07-26 DIAGNOSIS — N1831 Chronic kidney disease, stage 3a: Secondary | ICD-10-CM | POA: Diagnosis not present

## 2022-07-27 ENCOUNTER — Encounter (HOSPITAL_BASED_OUTPATIENT_CLINIC_OR_DEPARTMENT_OTHER): Payer: No Typology Code available for payment source | Admitting: General Surgery

## 2022-07-27 DIAGNOSIS — M86161 Other acute osteomyelitis, right tibia and fibula: Secondary | ICD-10-CM | POA: Diagnosis not present

## 2022-07-27 DIAGNOSIS — Z89421 Acquired absence of other right toe(s): Secondary | ICD-10-CM | POA: Diagnosis not present

## 2022-07-27 DIAGNOSIS — E1169 Type 2 diabetes mellitus with other specified complication: Secondary | ICD-10-CM | POA: Diagnosis not present

## 2022-07-27 DIAGNOSIS — Z4781 Encounter for orthopedic aftercare following surgical amputation: Secondary | ICD-10-CM | POA: Diagnosis not present

## 2022-07-27 DIAGNOSIS — E785 Hyperlipidemia, unspecified: Secondary | ICD-10-CM | POA: Diagnosis not present

## 2022-07-28 ENCOUNTER — Telehealth (INDEPENDENT_AMBULATORY_CARE_PROVIDER_SITE_OTHER): Payer: Medicare Other | Admitting: Internal Medicine

## 2022-07-28 ENCOUNTER — Encounter: Payer: Self-pay | Admitting: Internal Medicine

## 2022-07-28 ENCOUNTER — Other Ambulatory Visit: Payer: Self-pay

## 2022-07-28 ENCOUNTER — Encounter: Payer: Self-pay | Admitting: Podiatry

## 2022-07-28 ENCOUNTER — Telehealth: Payer: Self-pay | Admitting: Internal Medicine

## 2022-07-28 DIAGNOSIS — M86171 Other acute osteomyelitis, right ankle and foot: Secondary | ICD-10-CM

## 2022-07-28 MED ORDER — AMOXICILLIN-POT CLAVULANATE 875-125 MG PO TABS: 1.0000 | ORAL_TABLET | Freq: Two times a day (BID) | ORAL | 0 refills | Status: DC

## 2022-07-28 MED ORDER — CIPROFLOXACIN HCL 750 MG PO TABS: 750.0000 mg | ORAL_TABLET | Freq: Two times a day (BID) | ORAL | 0 refills | Status: DC

## 2022-07-28 NOTE — Telephone Encounter (Signed)
Reviewed labs: CRP elevated 18.3, esr trended down to 31(38). Pt not available for vedio visit. Conveyed to continue abx till pt can be seen, preferable in person.

## 2022-08-05 ENCOUNTER — Ambulatory Visit: Payer: Medicare Other | Admitting: Podiatry

## 2022-08-05 DIAGNOSIS — L97514 Non-pressure chronic ulcer of other part of right foot with necrosis of bone: Secondary | ICD-10-CM | POA: Diagnosis not present

## 2022-08-05 DIAGNOSIS — M86071 Acute hematogenous osteomyelitis, right ankle and foot: Secondary | ICD-10-CM

## 2022-08-05 DIAGNOSIS — Z89421 Acquired absence of other right toe(s): Secondary | ICD-10-CM

## 2022-08-05 DIAGNOSIS — E1151 Type 2 diabetes mellitus with diabetic peripheral angiopathy without gangrene: Secondary | ICD-10-CM

## 2022-08-05 DIAGNOSIS — I739 Peripheral vascular disease, unspecified: Secondary | ICD-10-CM

## 2022-08-05 NOTE — Progress Notes (Signed)
Subjective:  Patient ID: Wayne [name], male    DOB: 03-19-1941,  MRN: [id number]  Chief Complaint  Patient presents with   Diabetic Ulcer    Patient states that his foot/ulcer is doing good. He states no pain.    DOS: 05/25/2022 Procedure: Partial fifth ray amputation right foot  82 y.o. male returns for post-op check.  Patient and family present reports that the partial amputation site on the right foot is making little progress.  Going to wound care center in [place]. They have switched to medihoney and hydrofera blue dressings.  Patient is still taking antibiotics per infectious disease.  No nausea vomiting fever chills.  Minimal drainage from site.   Objective:  General: AAO x3, NAD  Dermatological: Incision at the proximal aspect of the amputation line with further dehisence at this point, long thin opening with skin edges retracting/curling in.  Ulceration remains along the lateral aspect of the foot at the fourth metatarsal head now with exposure of 4th met head laterally after debridement of overlying fibrotic tissue. Hyperkeratotic tissue around the wound.  There is minimal to no erythema and no active drainage from the area.  Relatively stable from prior with interval increase in bone exposure at the 4th met head laterally. Also with dehisence of the proximal amputation site which does probe down to 5th met stump. Measures 7x2 cm x0.5 cm post debridment.    Vascular: Pulses decreased-foot appears to be warm and perfused.  Calf is supple.  Impression erythema warmth.  Neruologic: Sensation decreased  Musculoskeletal: Partial fifth ray amputation.  No pain on exam  Gait: Unassisted, Nonantalgic.   Assessment:    1. Ulcer of right foot with necrosis of bone (HCC)   2. Acute hematogenous osteomyelitis of right foot (HCC)   3. PAD (peripheral artery disease) (HCC)   4. 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 Type 2 diabetes mellitus  with peripheral vascular disease (HCC)      Plan:  Patient was evaluated and treated and all questions answered.  S/p foot surgery right partial fifth ray amputation -X-rays deferred at this visit  -Debridement as below Pt remains without interval mild improvement.  He has a severe PAD which is contributing to this.   -Continue with dressing care and right foot dressing changes every second or third day with dressing as per wound care center, currently medihoney, 4 x 4 gauze Curlex and Ace wrap -Continue following with [place] wound care center outside facility.  Follow their recommendations for wound care -WB Status: Weightbearing as tolerated in postop shoe  -Medications: Continue antibiotics per infectious disease.   -Overall the wound is making limited progress at this point.  Discussed there is no evidence of infection at this time.  Continue with routine debridements routine wound care. - At this point I do recommend proceeding with OR debridment of the ulcer, possible bone biopsy and grafting of the wound site with partial delayed primary closure as possible. Patient and family are in agreement. Discussed this may ultimately be unsuccessful but should be attempted to promote wound healing and give best chance at limb salvage. Informed consent obtained after discussion of risks/benefits. Surgical planning.   Procedure: Excisional Debridement of Wound Rationale: Removal of non-viable soft tissue from the wound to promote healing.  Anesthesia: none Post-Debridement Wound Measurements: 7 cm x 2 cm x 0.5 cm.  Type of Debridement: Sharp Excisional Tissue Removed: Non-viable soft tissue Depth of Debridement:  subcutaneous tissue. Technique: Sharp excisional debridement to bleeding, viable wound base.  Dressing: Dry, sterile, compression dressing. Disposition: Patient tolerated procedure well.     Jenelle Mages Wayne [name] DPM

## 2022-08-06 ENCOUNTER — Telehealth: Payer: Self-pay | Admitting: Internal Medicine

## 2022-08-06 DIAGNOSIS — E1152 Type 2 diabetes mellitus with diabetic peripheral angiopathy with gangrene: Secondary | ICD-10-CM | POA: Diagnosis not present

## 2022-08-06 DIAGNOSIS — E11621 Type 2 diabetes mellitus with foot ulcer: Secondary | ICD-10-CM | POA: Diagnosis not present

## 2022-08-06 DIAGNOSIS — E1140 Type 2 diabetes mellitus with diabetic neuropathy, unspecified: Secondary | ICD-10-CM | POA: Diagnosis not present

## 2022-08-06 DIAGNOSIS — I96 Gangrene, not elsewhere classified: Secondary | ICD-10-CM | POA: Diagnosis not present

## 2022-08-06 DIAGNOSIS — I739 Peripheral vascular disease, unspecified: Secondary | ICD-10-CM | POA: Diagnosis not present

## 2022-08-06 DIAGNOSIS — L97412 Non-pressure chronic ulcer of right heel and midfoot with fat layer exposed: Secondary | ICD-10-CM | POA: Diagnosis not present

## 2022-08-06 NOTE — Telephone Encounter (Signed)
S/w pt's daughter per DPR will keep follow on 5/17 with Dr.  Antoine Poche.  Will take out of preop

## 2022-08-06 NOTE — Telephone Encounter (Signed)
   Name: Wayne [name]  DOB: 1941-03-03  MRN: [id number]  Primary Cardiologist: Christell Constant, MD  Chart reviewed as part of pre-operative protocol coverage. Because of [name] past medical history and time since last visit, he will require a follow-up telephone visit in order to better assess preoperative cardiovascular risk.  Pre-op covering staff: - Please schedule appointment and call patient to inform them. If patient already had an upcoming appointment within acceptable timeframe, please add "pre-op clearance" to the appointment notes so provider is aware. - Please contact requesting surgeon's office via preferred method (i.e, phone, fax) to inform them of need for appointment prior to surgery.  The patient is not on any blood thinners according to our records, therefore no medications need to be held.Sharlene Dory, PA-C  08/06/2022, 8:34 AM

## 2022-08-06 NOTE — Telephone Encounter (Signed)
   Pre-operative Risk Assessment    Patient Name: Wayne [name]  DOB: [birth year] MRN: [id number]      Request for Surgical Clearance    Procedure:   Debridement of Wound, a skin graft, and delayed partial closure on the right foot  Date of Surgery:  Clearance 08/18/22                                 Surgeon:  Dr. Carlena Hurl Surgeon's Group or Practice Name:  [facility] Phone number:  [phone] Fax number:  [phone]   Type of Clearance Requested:   - Medical  - Pharmacy:  Hold Would like to know which medication needs to be held and for how long      Type of Anesthesia:   Anesthesiologist Choice   Additional requests/questions:   Asking for medical clearance and which medications should be held and for how long.  Cathey Endow   08/06/2022, 8:24 AM

## 2022-08-09 ENCOUNTER — Telehealth: Payer: Self-pay | Admitting: Urology

## 2022-08-09 ENCOUNTER — Telehealth: Payer: Self-pay

## 2022-08-09 NOTE — Telephone Encounter (Signed)
Dr. Tivis Ringer from the [facility] called and would like to speak with Dr. Annamary Rummage regarding care of the patient. Dr. Neysa Bonito can be reached at the office at [phone] or by cell at [phone].

## 2022-08-09 NOTE — Telephone Encounter (Signed)
DOS  - 08/18/22  OSTECT COMP MET HEAD 4TH RIGHT --- 16109 DEBRIDEMENT OF BONE RIGHT --- 11044 WOUND CLOSURE RIGHT --- 13160 APPLICATION GRAFT RIGHT --- 15275 BONE BIOPSY RIGHT --- 20240  [facility] EFFECTIVE DATE - 05/28/22  DEDUCTIBLE - $0.00 OOP - $3,600.00 W/ $1,290.37 REMAINING COINSURANCE - 0%   PER [employer] WEBSITE FOR CPT CODES 60454, 11044, 13160, 15275 AND  20240 Notification or Prior Authorization is not required for the requested services You are not required to submit a notification/prior authorization based on the information provided. If you have general questions about the prior authorization requirements, visit [email or web address] > Clinician Resources > Advance and Admission Notification Requirements. The number above acknowledges your notification. Please write this reference number down for future reference. If you would like to request an organization determination, please call us at [phone].   Decision ID #: [id number]

## 2022-08-10 ENCOUNTER — Encounter: Payer: Self-pay | Admitting: Podiatry

## 2022-08-10 DIAGNOSIS — Z4781 Encounter for orthopedic aftercare following surgical amputation: Secondary | ICD-10-CM | POA: Diagnosis not present

## 2022-08-10 DIAGNOSIS — E785 Hyperlipidemia, unspecified: Secondary | ICD-10-CM | POA: Diagnosis not present

## 2022-08-10 DIAGNOSIS — M86161 Other acute osteomyelitis, right tibia and fibula: Secondary | ICD-10-CM | POA: Diagnosis not present

## 2022-08-10 DIAGNOSIS — Z89421 Acquired absence of other right toe(s): Secondary | ICD-10-CM | POA: Diagnosis not present

## 2022-08-10 DIAGNOSIS — E1169 Type 2 diabetes mellitus with other specified complication: Secondary | ICD-10-CM | POA: Diagnosis not present

## 2022-08-11 ENCOUNTER — Telehealth: Payer: Self-pay | Admitting: Family Medicine

## 2022-08-11 NOTE — Telephone Encounter (Signed)
Wayne [name] informed that Wayne [name] does not have an appt until 5/30. Pts procedure is 5/31 for wound debridement of foot. Pt has a preop appt scheduled with [name] on 5/17. [name] that she should call the surgeons office to see if [name] appt is sufficient prior to the procedure.  Wayne [name] will call back to let us know. Booked surgical clearance 5/30 at 9:20. Pt is also scheduled for regular check up with Wayne [name] on 5/23. Advised to keep that appt. Wayne [name] would like to see if Wayne [name] would do clearance during that visit. Informed that the protocol is no but that it would be up to [name].   Will send message to Wayne [name] to see if she is able to accommodate. Wayne [name] made aware that Wayne [name] will not be in the office until 5/21.

## 2022-08-11 NOTE — Progress Notes (Signed)
Cardiology Office Note:   Date:  08/13/2022  ID:  Wayne [name], DOB 08-15-1940, MRN [id number]  History of Present Illness:   Wayne [name] is a 82 y.o. male for evaluation of CAD  and PAD.  He has a history of coronary artery disease followed years ago by Dr. Deborah Chalk.  He has had multiple hospitalizations last year.  He had foot infection, anemia, encephalopathy, lactic acidosis, subdural hemorrhage, hypoxic respiratory failure with severe pulm admission, CKD 2-3.  At the last visit earlier this year he was doing okay.  He is here preoperatively.  He is possibly going to have surgery on his foot for nonhealing diabetic ulcers.  He had a history of peripheral arterial disease with an angiogram in 823 demonstrating diffusely diseased popliteal was widely patent.  He had an occluded right tibial artery and they were unable to antegradely loss of this.  He actually had foot surgery earlier this year and again might need revision.  He lives at home.  He gets around minimally in the house but he is able to get himself out of bed and go to the bathroom. The patient denies any new symptoms such as chest discomfort, neck or arm discomfort. There has been no new shortness of breath, PND or orthopnea. There have been no reported palpitations, presyncope or syncope.    PV angio 11/13/21 with diffusely diseased popliteal but widely patent, occluded R tibial artery, unable to cross the lesion antegrade - recommended to follow clinically and if he had deterioration, consider retrograde posterior tibial access.    ROS: As stated in the HPI and negative for all other systems.  Studies Reviewed:    EKG:  Sinus rhythm, rate 63, axis within normal limits, intervals within normal limits, no acute ST-T wave changes, 05/20/2022   Risk Assessment/Calculations:              Physical Exam:   VS:  BP (!) 120/56 (BP Location: Left Arm, Patient Position: Sitting, Cuff Size: Normal)   Pulse 60   Ht 6' (1.829  m)   Wt 174 lb (78.9 kg)   SpO2 98%   BMI 23.60 kg/m    Wt Readings from Last 3 Encounters:  08/13/22 174 lb (78.9 kg)  05/29/22 179 lb 7.3 oz (81.4 kg)  05/05/22 175 lb (79.4 kg)     GEN: Well nourished, well developed in no acute distress NECK: No JVD; No carotid bruits CARDIAC: RRR, no murmurs, rubs, gallops RESPIRATORY:  Clear to auscultation without rales, wheezing or rhonchi  ABDOMEN: Soft, non-tender, non-distended EXTREMITIES:  No edema; No deformity   ASSESSMENT AND PLAN:   Chronic combined CHF:   With the patient seems to be euvolemic.  He is tolerating meds.  He has been bradycardic and had some issues and so not can pursue med titration.  No change in therapy.  Echocardiogram in February demonstrated his EF to be about 50 to 55%.    Type 2 diabetes: A1c is 5.9.  No change in therapy..   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   As above he is going to have possible surgery again.   Preop: The patient would be at acceptable risk for the planned surgery.  He has no high risk findings and has had a lot of hospitalizations without acute cardiac issues recently.  No further preoperative testing is indicated.  They understand the risk lies in mostly his chronic  disease and his age and debility.  This with her attend a moderate cardiovascular risk but not prohibitive.  I had this conversation with the patient and the family.       Signed, Rollene Rotunda, MD

## 2022-08-13 ENCOUNTER — Telehealth: Payer: Self-pay | Admitting: Podiatry

## 2022-08-13 ENCOUNTER — Encounter: Payer: Self-pay | Admitting: Cardiology

## 2022-08-13 ENCOUNTER — Ambulatory Visit: Payer: Medicare Other | Attending: Cardiology | Admitting: Cardiology

## 2022-08-13 ENCOUNTER — Encounter: Payer: Self-pay | Admitting: Podiatry

## 2022-08-13 VITALS — BP 120/56 | HR 60 | Ht 72.0 in | Wt 174.0 lb

## 2022-08-13 DIAGNOSIS — E1151 Type 2 diabetes mellitus with diabetic peripheral angiopathy without gangrene: Secondary | ICD-10-CM | POA: Diagnosis not present

## 2022-08-13 DIAGNOSIS — I5042 Chronic combined systolic (congestive) and diastolic (congestive) heart failure: Secondary | ICD-10-CM | POA: Diagnosis not present

## 2022-08-13 DIAGNOSIS — Z7984 Long term (current) use of oral hypoglycemic drugs: Secondary | ICD-10-CM

## 2022-08-13 NOTE — Telephone Encounter (Signed)
Received voicemail from 5.16 at 503pm Dr Fredirick Lathe is a physician at the wound care center in [place] stating he left a message last week as well but wanted to talk to you about this pt and his upcoming surgery. Could you please call him back at office number is [phone]/ personal cell is [phone].

## 2022-08-13 NOTE — Patient Instructions (Signed)
    Follow-Up: At [facility], you and your health needs are our priority.  As part of our continuing mission to provide you with exceptional heart care, we have created designated Provider Care Teams.  These Care Teams include your primary Cardiologist (physician) and Advanced Practice Providers (APPs -  Physician Assistants and Nurse Practitioners) who all work together to provide you with the care you need, when you need it.  We recommend signing up for the patient portal called "MyChart".  Sign up information is provided on this After Visit Summary.  MyChart is used to connect with patients for Virtual Visits (Telemedicine).  Patients are able to view lab/test results, encounter notes, upcoming appointments, etc.  Non-urgent messages can be sent to your provider as well.   To learn more about what you can do with MyChart, go to [email or web address].    Your next appointment:   6 month(s)  Provider:   Bailey Mech NP

## 2022-08-16 ENCOUNTER — Encounter: Payer: Self-pay | Admitting: Podiatry

## 2022-08-17 ENCOUNTER — Telehealth: Payer: Self-pay

## 2022-08-17 DIAGNOSIS — I872 Venous insufficiency (chronic) (peripheral): Secondary | ICD-10-CM | POA: Diagnosis not present

## 2022-08-17 NOTE — Telephone Encounter (Signed)
Is it OK if I send this in for him? Just wanted to check since you have been following him and I see he is scheduled for surgery tomorrow.

## 2022-08-17 NOTE — Telephone Encounter (Signed)
Spoke to [place] (daughter) this morning and she has informed me that they want to cancel Jakyrie's surgery with Dr. Annamary Rummage on 08/18/2022. We did received cardiac clearance and H&P through Cardiologist. I notified Dr. Annamary Rummage and centralized scheduling to cancel surgery.

## 2022-08-18 ENCOUNTER — Ambulatory Visit (HOSPITAL_COMMUNITY): Admission: RE | Admit: 2022-08-18 | Payer: Medicare Other | Source: Home / Self Care | Admitting: Podiatry

## 2022-08-18 ENCOUNTER — Encounter (HOSPITAL_COMMUNITY): Admission: RE | Payer: Self-pay | Source: Home / Self Care

## 2022-08-18 SURGERY — DEBRIDEMENT, WOUND
Anesthesia: Choice | Site: Toe | Laterality: Right

## 2022-08-19 ENCOUNTER — Ambulatory Visit (INDEPENDENT_AMBULATORY_CARE_PROVIDER_SITE_OTHER): Payer: Medicare Other | Admitting: Family Medicine

## 2022-08-19 ENCOUNTER — Encounter: Payer: Self-pay | Admitting: Family Medicine

## 2022-08-19 ENCOUNTER — Ambulatory Visit: Payer: Medicare Other

## 2022-08-19 VITALS — BP 150/64 | HR 60 | Temp 97.5°F

## 2022-08-19 DIAGNOSIS — Z01818 Encounter for other preprocedural examination: Secondary | ICD-10-CM

## 2022-08-19 DIAGNOSIS — E1151 Type 2 diabetes mellitus with diabetic peripheral angiopathy without gangrene: Secondary | ICD-10-CM | POA: Diagnosis not present

## 2022-08-19 LAB — BAYER DCA HB A1C WAIVED: HB A1C (BAYER DCA - WAIVED): 6.7 % — ABNORMAL HIGH (ref 4.8–5.6)

## 2022-08-19 NOTE — Progress Notes (Signed)
Subjective:  Patient ID: Wayne [name], male    DOB: Jul 23, 1940, 82 y.o.   MRN: [id number]  Patient Care Team: Sonny Masters, FNP as PCP - General (Family Medicine) Christell Constant, MD as PCP - Cardiology (Cardiology) Vivi Barrack, DPM as Consulting Physician (Podiatry) Jodelle Gross, NP as Nurse Practitioner (Cardiology)   Chief Complaint:  surgical clearance (Right foot surgery )   HPI: Wayne [name] is a 82 y.o. male presenting on 08/19/2022 for surgical clearance (Right foot surgery )   1. Preoperative clearance Pt presents today for preoperative examination. He is scheduled to have wound debridement of his right foot. He has been seen and cleared by cardiology. He does not have paperwork with him today. No complaints.      Relevant past medical, surgical, family, and social history reviewed and updated as indicated.  Allergies and medications reviewed and updated. Data reviewed: Chart in Epic.   Past Medical History:  Diagnosis Date   Anemia of chronic disease    Chronic HFrEF (heart failure with reduced ejection fraction) (HCC)    Chronic kidney disease, stage 3a (HCC)    Coronary artery disease    Dr. Deborah Chalk years ago. No details.   Diabetes mellitus with circulatory complication (HCC)    Diabetic foot infection (HCC)    Encephalopathy    Former tobacco use    PAD (peripheral artery disease) (HCC)    Peripheral neuropathy    SDH (subdural hematoma) (HCC)    Severe pulmonary hypertension (HCC)     Past Surgical History:  Procedure Laterality Date   ABDOMINAL AORTOGRAM W/LOWER EXTREMITY Right 11/13/2021   Procedure: ABDOMINAL AORTOGRAM W/LOWER EXTREMITY;  Surgeon: Leonie Douglas, MD;  Location: [facility] INV[facility]IVE [facility];  Service: Cardiovascular;  Laterality: Right;   AMPUTATION Right 05/23/2022   Procedure: AMPUTATION RAY;  Surgeon: Pilar Plate, DPM;  Location: [facility] OR;  Service: Podiatry;  Laterality: Right;  Partial 5th ray  amputation   CORONARY ANGIOPLASTY WITH STENT PLACEMENT     IRRIGATION AND DEBRIDEMENT FOOT Right 05/25/2022   Procedure: IRRIGATION AND DEBRIDEMENT FOOT;  Surgeon: Vivi Barrack, DPM;  Location: [facility] OR;  Service: Podiatry;  Laterality: Right;    Social History   Socioeconomic History   Marital status: Divorced    Spouse name: Not on file   Number of children: Not on file   Years of education: Not on file   Highest education level: Not on file  Occupational History   Not on file  Tobacco Use   Smoking status: Former    Types: Cigarettes    Quit date: 103    Years since quitting: 31.4   Smokeless tobacco: Never  Vaping Use   Vaping Use: Never used  Substance and Sexual Activity   Alcohol use: No   Drug use: No   Sexual activity: Not Currently  Other Topics Concern   Not on file  Social History Narrative   Not on file   Social Determinants of Health   Financial Resource Strain: Low Risk  (05/05/2022)   Overall Financial Resource Strain (CARDIA)    Difficulty of Paying Living Expenses: Not hard at all  Food Insecurity: No Food Insecurity (05/22/2022)   Hunger Vital Sign    Worried About Running Out of Food in the Last Year: Never true    Ran Out of Food in the Last Year: Never true  Transportation Needs: No Transportation Needs (05/22/2022)   PRAPARE - Transportation  Lack of Transportation (Medical): No    Lack of Transportation (Non-Medical): No  Physical Activity: Inactive (05/05/2022)   Exercise Vital Sign    Days of Exercise per Week: 0 days    Minutes of Exercise per Session: 0 min  Stress: No Stress Concern Present (05/05/2022)   [employer] of Occupational Health - Occupational Stress Questionnaire    Feeling of Stress : Not at all  Social Connections: Socially Isolated (05/05/2022)   Social Connection and Isolation Panel [NHANES]    Frequency of Communication with Friends and Family: More than three times a week    Frequency of Social Gatherings with  Friends and Family: More than three times a week    Attends Religious Services: Never    Database administrator or Organizations: No    Attends Banker Meetings: Never    Marital Status: Divorced  Catering manager Violence: Not At Risk (05/22/2022)   Humiliation, Afraid, Rape, and Kick questionnaire    Fear of Current or Ex-Partner: No    Emotionally Abused: No    Physically Abused: No    Sexually Abused: No    Outpatient Encounter Medications as of 08/19/2022  Medication Sig   Accu-Chek Softclix Lancets lancets Use 4 times daily as directed to check blood sugars.   amoxicillin-clavulanate (AUGMENTIN) 875-125 MG tablet Take 1 tablet by mouth 2 (two) times daily.   blood glucose meter kit and supplies KIT Dispense based on patient and insurance preference. Use up to four times daily as directed.   blood glucose meter kit and supplies KIT Use up to four times daily as directed.   ciclopirox (PENLAC) 8 % solution Apply topically at bedtime. Apply over nail and surrounding skin. Apply daily over previous coat. After seven (7) days, may remove with alcohol and continue cycle.   ciprofloxacin (CIPRO) 750 MG tablet Take 1 tablet (750 mg total) by mouth 2 (two) times daily.   dapagliflozin propanediol (FARXIGA) 10 MG TABS tablet Take 1 tablet (10 mg total) by mouth daily.   furosemide (LASIX) 40 MG tablet Take 1 tablet (40 mg total) by mouth daily.   glipiZIDE (GLUCOTROL) 5 MG tablet TAKE 1 TABLET BY MOUTH DAILY BEFORE BREAKFAST.   glucose blood test strip Use as instructed   glucose blood test strip Use 4 times daily as directed to check bloos sugars.   metoprolol succinate (TOPROL-XL) 25 MG 24 hr tablet TAKE 1/2 TABLET BY MOUTH DAILY (Patient taking differently: Take 12.5-25 mg by mouth See admin instructions. Take 25 mg in the morning and 12.5 mg at night)   Multiple Vitamin (MULTIVITAMIN WITH MINERALS) TABS tablet Take 1 tablet by mouth daily.   [DISCONTINUED] collagenase (SANTYL)  250 UNIT/GM ointment Apply 1 Application topically daily.   tamsulosin (FLOMAX) 0.4 MG CAPS capsule Take 1 capsule (0.4 mg total) by mouth daily after breakfast. (Patient not taking: Reported on 08/19/2022)   [DISCONTINUED] feeding supplement (ENSURE ENLIVE / ENSURE PLUS) LIQD Take 237 mLs by mouth 2 (two) times daily between meals.   No facility-administered encounter medications on file as of 08/19/2022.    No Known Allergies  Review of Systems  Constitutional:  Negative for activity change, appetite change, chills, diaphoresis, fatigue, fever and unexpected weight change.  HENT: Negative.    Eyes: Negative.   Respiratory:  Negative for cough, chest tightness and shortness of breath.   Cardiovascular:  Negative for chest pain, palpitations and leg swelling.  Gastrointestinal:  Negative for blood in stool, constipation, diarrhea,  nausea and vomiting.  Endocrine: Negative.   Genitourinary:  Negative for dysuria, frequency and urgency.  Musculoskeletal:  Negative for arthralgias and myalgias.  Skin:  Positive for wound (right foot).  Allergic/Immunologic: Negative.   Neurological:  Negative for dizziness and headaches.  Hematological: Negative.   Psychiatric/Behavioral:  Negative for confusion, hallucinations, sleep disturbance and suicidal ideas.   All other systems reviewed and are negative.       Objective:  BP (!) 150/64   Pulse 60   Temp (!) 97.5 F (36.4 C) (Temporal)   SpO2 96%    Wt Readings from Last 3 Encounters:  08/13/22 174 lb (78.9 kg)  05/29/22 179 lb 7.3 oz (81.4 kg)  05/05/22 175 lb (79.4 kg)    Physical Exam Vitals and nursing note reviewed.  Constitutional:      General: He is not in acute distress.    Appearance: Normal appearance. He is not ill-appearing, toxic-appearing or diaphoretic.  HENT:     Head: Normocephalic.     Right Ear: Decreased hearing noted.     Left Ear: Decreased hearing noted.     Mouth/Throat:     Mouth: Mucous membranes are  moist.  Eyes:     Conjunctiva/sclera: Conjunctivae normal.     Pupils: Pupils are equal, round, and reactive to light.  Cardiovascular:     Rate and Rhythm: Normal rate and regular rhythm.     Heart sounds: Normal heart sounds.  Pulmonary:     Effort: Pulmonary effort is normal.     Breath sounds: Normal breath sounds.  Musculoskeletal:     Comments: Cast shoe on right foot  Skin:    General: Skin is warm and dry.     Capillary Refill: Capillary refill takes less than 2 seconds.  Neurological:     Mental Status: He is alert. Mental status is at baseline.     Gait: Gait abnormal (in wheelchair).  Psychiatric:        Mood and Affect: Mood normal.        Behavior: Behavior normal.     Results for orders placed or performed in visit on 08/19/22  Bayer DCA Hb A1c Waived  Result Value Ref Range   HB A1C (BAYER DCA - WAIVED) 6.7 (H) 4.8 - 5.6 %       Pertinent labs & imaging results that were available during my care of the patient were reviewed by me and considered in my medical decision making.  Assessment & Plan:  Dany was seen today for surgical clearance.  Diagnoses and all orders for this visit:  Preoperative clearance Pt has been cleared from a cardiology stand point. Will check below labs, if acceptable, will be cleared from a medical standpoint.  -     CBC with Differential/Platelet -     CMP14+EGFR -     Bayer DCA Hb A1c Waived     Continue all other maintenance medications.  Follow up plan: Return if symptoms worsen or fail to improve.   Continue healthy lifestyle choices, including diet (rich in fruits, vegetables, and lean proteins, and low in salt and simple carbohydrates) and exercise (at least 30 minutes of moderate physical activity daily).   The above assessment and management plan was discussed with the patient. The patient verbalized understanding of and has agreed to the management plan. Patient is aware to call the clinic if they develop any new  symptoms or if symptoms persist or worsen. Patient is aware when to return to  the clinic for a follow-up visit. Patient educated on when it is appropriate to go to the emergency department.   Kari Baars, FNP-C [facility] [phone]

## 2022-08-20 ENCOUNTER — Ambulatory Visit: Payer: Medicare Other | Admitting: Family Medicine

## 2022-08-20 LAB — CBC WITH DIFFERENTIAL/PLATELET
Basophils Absolute: 0 10*3/uL (ref 0.0–0.2)
Basos: 1 %
EOS (ABSOLUTE): 0.2 10*3/uL (ref 0.0–0.4)
Eos: 3 %
Hematocrit: 39.3 % (ref 37.5–51.0)
Hemoglobin: 13.2 g/dL (ref 13.0–17.7)
Immature Grans (Abs): 0 10*3/uL (ref 0.0–0.1)
Immature Granulocytes: 1 %
Lymphocytes Absolute: 2.3 10*3/uL (ref 0.7–3.1)
Lymphs: 35 %
MCH: 27.7 pg (ref 26.6–33.0)
MCHC: 33.6 g/dL (ref 31.5–35.7)
MCV: 83 fL (ref 79–97)
Monocytes Absolute: 0.6 10*3/uL (ref 0.1–0.9)
Monocytes: 9 %
Neutrophils Absolute: 3.5 10*3/uL (ref 1.4–7.0)
Neutrophils: 51 %
Platelets: 195 10*3/uL (ref 150–450)
RBC: 4.76 x10E6/uL (ref 4.14–5.80)
RDW: 14.5 % (ref 11.6–15.4)
WBC: 6.6 10*3/uL (ref 3.4–10.8)

## 2022-08-20 LAB — CMP14+EGFR
ALT: 9 IU/L (ref 0–44)
AST: 12 IU/L (ref 0–40)
Albumin/Globulin Ratio: 1.4 (ref 1.2–2.2)
Albumin: 4 g/dL (ref 3.7–4.7)
Alkaline Phosphatase: 128 IU/L — ABNORMAL HIGH (ref 44–121)
BUN/Creatinine Ratio: 29 — ABNORMAL HIGH (ref 10–24)
BUN: 43 mg/dL — ABNORMAL HIGH (ref 8–27)
Bilirubin Total: 0.8 mg/dL (ref 0.0–1.2)
CO2: 20 mmol/L (ref 20–29)
Calcium: 9.3 mg/dL (ref 8.6–10.2)
Chloride: 102 mmol/L (ref 96–106)
Creatinine, Ser: 1.48 mg/dL — ABNORMAL HIGH (ref 0.76–1.27)
Globulin, Total: 2.9 g/dL (ref 1.5–4.5)
Glucose: 89 mg/dL (ref 70–99)
Potassium: 4.5 mmol/L (ref 3.5–5.2)
Sodium: 139 mmol/L (ref 134–144)
Total Protein: 6.9 g/dL (ref 6.0–8.5)
eGFR: 47 mL/min/{1.73_m2} — ABNORMAL LOW (ref >59)

## 2022-08-22 ENCOUNTER — Other Ambulatory Visit: Payer: Self-pay | Admitting: Family Medicine

## 2022-08-22 DIAGNOSIS — E1151 Type 2 diabetes mellitus with diabetic peripheral angiopathy without gangrene: Secondary | ICD-10-CM

## 2022-08-24 ENCOUNTER — Ambulatory Visit (INDEPENDENT_AMBULATORY_CARE_PROVIDER_SITE_OTHER): Payer: Medicare Other

## 2022-08-24 ENCOUNTER — Inpatient Hospital Stay (HOSPITAL_COMMUNITY)
Admission: EM | Admit: 2022-08-24 | Discharge: 2022-09-01 | DRG: 616 | Disposition: A | Discharge status: Skilled Nursing Facility | Payer: Medicare Other | Source: Ambulatory Visit | Attending: Family Medicine | Admitting: Family Medicine

## 2022-08-24 ENCOUNTER — Encounter (HOSPITAL_COMMUNITY): Payer: Self-pay

## 2022-08-24 ENCOUNTER — Other Ambulatory Visit: Payer: Self-pay

## 2022-08-24 ENCOUNTER — Observation Stay (HOSPITAL_COMMUNITY): Payer: Medicare Other

## 2022-08-24 ENCOUNTER — Ambulatory Visit: Payer: Medicare Other | Admitting: Podiatry

## 2022-08-24 DIAGNOSIS — M86671 Other chronic osteomyelitis, right ankle and foot: Secondary | ICD-10-CM | POA: Diagnosis not present

## 2022-08-24 DIAGNOSIS — I272 Pulmonary hypertension, unspecified: Secondary | ICD-10-CM | POA: Diagnosis not present

## 2022-08-24 DIAGNOSIS — R770 Abnormality of albumin: Secondary | ICD-10-CM | POA: Diagnosis not present

## 2022-08-24 DIAGNOSIS — M86071 Acute hematogenous osteomyelitis, right ankle and foot: Secondary | ICD-10-CM

## 2022-08-24 DIAGNOSIS — Z79899 Other long term (current) drug therapy: Secondary | ICD-10-CM | POA: Diagnosis not present

## 2022-08-24 DIAGNOSIS — Z4781 Encounter for orthopedic aftercare following surgical amputation: Secondary | ICD-10-CM | POA: Diagnosis not present

## 2022-08-24 DIAGNOSIS — Z7984 Long term (current) use of oral hypoglycemic drugs: Secondary | ICD-10-CM

## 2022-08-24 DIAGNOSIS — E1159 Type 2 diabetes mellitus with other circulatory complications: Secondary | ICD-10-CM | POA: Diagnosis not present

## 2022-08-24 DIAGNOSIS — Z66 Do not resuscitate: Secondary | ICD-10-CM | POA: Diagnosis not present

## 2022-08-24 DIAGNOSIS — Z794 Long term (current) use of insulin: Secondary | ICD-10-CM | POA: Diagnosis not present

## 2022-08-24 DIAGNOSIS — E876 Hypokalemia: Secondary | ICD-10-CM | POA: Diagnosis present

## 2022-08-24 DIAGNOSIS — I152 Hypertension secondary to endocrine disorders: Secondary | ICD-10-CM | POA: Diagnosis not present

## 2022-08-24 DIAGNOSIS — M84474A Pathological fracture, right foot, initial encounter for fracture: Secondary | ICD-10-CM | POA: Diagnosis present

## 2022-08-24 DIAGNOSIS — I951 Orthostatic hypotension: Secondary | ICD-10-CM | POA: Diagnosis present

## 2022-08-24 DIAGNOSIS — Z89431 Acquired absence of right foot: Secondary | ICD-10-CM | POA: Diagnosis not present

## 2022-08-24 DIAGNOSIS — Z87891 Personal history of nicotine dependence: Secondary | ICD-10-CM

## 2022-08-24 DIAGNOSIS — Z978 Presence of other specified devices: Secondary | ICD-10-CM | POA: Diagnosis not present

## 2022-08-24 DIAGNOSIS — Z1152 Encounter for screening for COVID-19: Secondary | ICD-10-CM | POA: Diagnosis not present

## 2022-08-24 DIAGNOSIS — E785 Hyperlipidemia, unspecified: Secondary | ICD-10-CM

## 2022-08-24 DIAGNOSIS — E8809 Other disorders of plasma-protein metabolism, not elsewhere classified: Secondary | ICD-10-CM | POA: Diagnosis present

## 2022-08-24 DIAGNOSIS — Z955 Presence of coronary angioplasty implant and graft: Secondary | ICD-10-CM

## 2022-08-24 DIAGNOSIS — M216X1 Other acquired deformities of right foot: Secondary | ICD-10-CM | POA: Diagnosis not present

## 2022-08-24 DIAGNOSIS — Z8701 Personal history of pneumonia (recurrent): Secondary | ICD-10-CM

## 2022-08-24 DIAGNOSIS — M86171 Other acute osteomyelitis, right ankle and foot: Secondary | ICD-10-CM | POA: Diagnosis not present

## 2022-08-24 DIAGNOSIS — R2681 Unsteadiness on feet: Secondary | ICD-10-CM | POA: Diagnosis not present

## 2022-08-24 DIAGNOSIS — M869 Osteomyelitis, unspecified: Secondary | ICD-10-CM | POA: Diagnosis not present

## 2022-08-24 DIAGNOSIS — E1122 Type 2 diabetes mellitus with diabetic chronic kidney disease: Secondary | ICD-10-CM | POA: Diagnosis present

## 2022-08-24 DIAGNOSIS — R488 Other symbolic dysfunctions: Secondary | ICD-10-CM | POA: Diagnosis not present

## 2022-08-24 DIAGNOSIS — M6281 Muscle weakness (generalized): Secondary | ICD-10-CM | POA: Diagnosis not present

## 2022-08-24 DIAGNOSIS — N179 Acute kidney failure, unspecified: Secondary | ICD-10-CM | POA: Diagnosis present

## 2022-08-24 DIAGNOSIS — I130 Hypertensive heart and chronic kidney disease with heart failure and stage 1 through stage 4 chronic kidney disease, or unspecified chronic kidney disease: Secondary | ICD-10-CM | POA: Diagnosis present

## 2022-08-24 DIAGNOSIS — E1140 Type 2 diabetes mellitus with diabetic neuropathy, unspecified: Secondary | ICD-10-CM | POA: Diagnosis not present

## 2022-08-24 DIAGNOSIS — I5032 Chronic diastolic (congestive) heart failure: Secondary | ICD-10-CM | POA: Diagnosis not present

## 2022-08-24 DIAGNOSIS — I509 Heart failure, unspecified: Secondary | ICD-10-CM | POA: Diagnosis not present

## 2022-08-24 DIAGNOSIS — E1169 Type 2 diabetes mellitus with other specified complication: Principal | ICD-10-CM | POA: Diagnosis present

## 2022-08-24 DIAGNOSIS — M79671 Pain in right foot: Secondary | ICD-10-CM | POA: Diagnosis present

## 2022-08-24 DIAGNOSIS — I739 Peripheral vascular disease, unspecified: Secondary | ICD-10-CM | POA: Diagnosis not present

## 2022-08-24 DIAGNOSIS — L97514 Non-pressure chronic ulcer of other part of right foot with necrosis of bone: Secondary | ICD-10-CM

## 2022-08-24 DIAGNOSIS — Z8679 Personal history of other diseases of the circulatory system: Secondary | ICD-10-CM

## 2022-08-24 DIAGNOSIS — I110 Hypertensive heart disease with heart failure: Secondary | ICD-10-CM | POA: Diagnosis not present

## 2022-08-24 DIAGNOSIS — M6701 Short Achilles tendon (acquired), right ankle: Secondary | ICD-10-CM | POA: Diagnosis not present

## 2022-08-24 DIAGNOSIS — E1151 Type 2 diabetes mellitus with diabetic peripheral angiopathy without gangrene: Secondary | ICD-10-CM | POA: Diagnosis not present

## 2022-08-24 DIAGNOSIS — Z8619 Personal history of other infectious and parasitic diseases: Secondary | ICD-10-CM | POA: Diagnosis not present

## 2022-08-24 DIAGNOSIS — D649 Anemia, unspecified: Secondary | ICD-10-CM | POA: Diagnosis not present

## 2022-08-24 DIAGNOSIS — T07XXXA Unspecified multiple injuries, initial encounter: Secondary | ICD-10-CM | POA: Diagnosis not present

## 2022-08-24 DIAGNOSIS — B356 Tinea cruris: Secondary | ICD-10-CM | POA: Diagnosis present

## 2022-08-24 DIAGNOSIS — M7731 Calcaneal spur, right foot: Secondary | ICD-10-CM | POA: Diagnosis not present

## 2022-08-24 DIAGNOSIS — E1143 Type 2 diabetes mellitus with diabetic autonomic (poly)neuropathy: Secondary | ICD-10-CM | POA: Diagnosis not present

## 2022-08-24 DIAGNOSIS — E1141 Type 2 diabetes mellitus with diabetic mononeuropathy: Secondary | ICD-10-CM | POA: Diagnosis not present

## 2022-08-24 DIAGNOSIS — M879 Osteonecrosis, unspecified: Secondary | ICD-10-CM | POA: Diagnosis present

## 2022-08-24 DIAGNOSIS — K5641 Fecal impaction: Secondary | ICD-10-CM | POA: Diagnosis not present

## 2022-08-24 DIAGNOSIS — N1831 Chronic kidney disease, stage 3a: Secondary | ICD-10-CM | POA: Diagnosis present

## 2022-08-24 DIAGNOSIS — Z743 Need for continuous supervision: Secondary | ICD-10-CM | POA: Diagnosis not present

## 2022-08-24 DIAGNOSIS — Z6822 Body mass index (BMI) 22.0-22.9, adult: Secondary | ICD-10-CM

## 2022-08-24 DIAGNOSIS — E440 Moderate protein-calorie malnutrition: Secondary | ICD-10-CM | POA: Diagnosis not present

## 2022-08-24 DIAGNOSIS — R140 Abdominal distension (gaseous): Secondary | ICD-10-CM | POA: Diagnosis not present

## 2022-08-24 DIAGNOSIS — Z9861 Coronary angioplasty status: Secondary | ICD-10-CM | POA: Diagnosis not present

## 2022-08-24 DIAGNOSIS — I10 Essential (primary) hypertension: Secondary | ICD-10-CM | POA: Diagnosis not present

## 2022-08-24 DIAGNOSIS — E43 Unspecified severe protein-calorie malnutrition: Secondary | ICD-10-CM | POA: Insufficient documentation

## 2022-08-24 DIAGNOSIS — R531 Weakness: Secondary | ICD-10-CM | POA: Diagnosis not present

## 2022-08-24 DIAGNOSIS — L97519 Non-pressure chronic ulcer of other part of right foot with unspecified severity: Secondary | ICD-10-CM | POA: Diagnosis not present

## 2022-08-24 DIAGNOSIS — I2510 Atherosclerotic heart disease of native coronary artery without angina pectoris: Secondary | ICD-10-CM | POA: Diagnosis not present

## 2022-08-24 DIAGNOSIS — Z89421 Acquired absence of other right toe(s): Secondary | ICD-10-CM | POA: Diagnosis not present

## 2022-08-24 DIAGNOSIS — R262 Difficulty in walking, not elsewhere classified: Secondary | ICD-10-CM | POA: Diagnosis not present

## 2022-08-24 DIAGNOSIS — S98311D Complete traumatic amputation of right midfoot, subsequent encounter: Secondary | ICD-10-CM | POA: Diagnosis not present

## 2022-08-24 DIAGNOSIS — Z7401 Bed confinement status: Secondary | ICD-10-CM | POA: Diagnosis not present

## 2022-08-24 LAB — CBC WITH DIFFERENTIAL/PLATELET
Abs Immature Granulocytes: 0.03 10*3/uL (ref 0.00–0.07)
Basophils Absolute: 0 10*3/uL (ref 0.0–0.1)
Basophils Relative: 1 %
Eosinophils Absolute: 0.3 10*3/uL (ref 0.0–0.5)
Eosinophils Relative: 5 %
HCT: 47.3 % (ref 39.0–52.0)
Hemoglobin: 14.9 g/dL (ref 13.0–17.0)
Immature Granulocytes: 1 %
Lymphocytes Relative: 28 %
Lymphs Abs: 1.5 10*3/uL (ref 0.7–4.0)
MCH: 27.1 pg (ref 26.0–34.0)
MCHC: 31.5 g/dL (ref 30.0–36.0)
MCV: 86 fL (ref 80.0–100.0)
Monocytes Absolute: 0.4 10*3/uL (ref 0.1–1.0)
Monocytes Relative: 8 %
Neutro Abs: 3.2 10*3/uL (ref 1.7–7.7)
Neutrophils Relative %: 57 %
Platelets: 205 10*3/uL (ref 150–400)
RBC: 5.5 MIL/uL (ref 4.22–5.81)
RDW: 13.8 % (ref 11.5–15.5)
WBC: 5.5 10*3/uL (ref 4.0–10.5)
nRBC: 0 % (ref 0.0–0.2)

## 2022-08-24 LAB — COMPREHENSIVE METABOLIC PANEL
ALT: 13 U/L (ref 0–44)
AST: 14 U/L — ABNORMAL LOW (ref 15–41)
Albumin: 3.7 g/dL (ref 3.5–5.0)
Alkaline Phosphatase: 99 U/L (ref 38–126)
Anion gap: 9 (ref 5–15)
BUN: 35 mg/dL — ABNORMAL HIGH (ref 8–23)
CO2: 22 mmol/L (ref 22–32)
Calcium: 9.1 mg/dL (ref 8.9–10.3)
Chloride: 104 mmol/L (ref 98–111)
Creatinine, Ser: 1.53 mg/dL — ABNORMAL HIGH (ref 0.61–1.24)
GFR, Estimated: 45 mL/min — ABNORMAL LOW (ref >60)
Glucose, Bld: 150 mg/dL — ABNORMAL HIGH (ref 70–99)
Potassium: 4.4 mmol/L (ref 3.5–5.1)
Sodium: 135 mmol/L (ref 135–145)
Total Bilirubin: 0.8 mg/dL (ref 0.3–1.2)
Total Protein: 8 g/dL (ref 6.5–8.1)

## 2022-08-24 LAB — LACTIC ACID, PLASMA: Lactic Acid, Venous: 1.1 mmol/L (ref 0.5–1.9)

## 2022-08-24 LAB — GLUCOSE, CAPILLARY
Glucose-Capillary: 100 mg/dL — ABNORMAL HIGH (ref 70–99)
Glucose-Capillary: 98 mg/dL (ref 70–99)

## 2022-08-24 LAB — CBG MONITORING, ED: Glucose-Capillary: 156 mg/dL — ABNORMAL HIGH (ref 70–99)

## 2022-08-24 MED ORDER — METOPROLOL SUCCINATE ER 25 MG PO TB24: 25.0000 mg | ORAL_TABLET | Freq: Every day | ORAL | Status: DC

## 2022-08-24 MED ORDER — INSULIN ASPART 100 UNIT/ML IJ SOLN
0.0000 [IU] | Freq: Three times a day (TID) | INTRAMUSCULAR | Status: DC
  Administered 2022-08-25: 2 [IU] via SUBCUTANEOUS
  Administered 2022-08-25 (×2): 1 [IU] via SUBCUTANEOUS
  Administered 2022-08-26: 5 [IU] via SUBCUTANEOUS
  Administered 2022-08-26: 1 [IU] via SUBCUTANEOUS
  Administered 2022-08-27: 5 [IU] via SUBCUTANEOUS
  Administered 2022-08-27 (×2): 2 [IU] via SUBCUTANEOUS
  Administered 2022-08-28: 1 [IU] via SUBCUTANEOUS
  Administered 2022-08-28: 2 [IU] via SUBCUTANEOUS
  Administered 2022-08-28: 3 [IU] via SUBCUTANEOUS
  Administered 2022-08-29: 1 [IU] via SUBCUTANEOUS
  Administered 2022-08-29: 2 [IU] via SUBCUTANEOUS
  Administered 2022-08-29: 1 [IU] via SUBCUTANEOUS
  Administered 2022-08-30 (×2): 2 [IU] via SUBCUTANEOUS
  Administered 2022-08-31: 1 [IU] via SUBCUTANEOUS
  Administered 2022-08-31 (×2): 2 [IU] via SUBCUTANEOUS
  Administered 2022-09-01 (×2): 1 [IU] via SUBCUTANEOUS
  Administered 2022-09-01: 2 [IU] via SUBCUTANEOUS

## 2022-08-24 MED ORDER — HYDRALAZINE HCL 20 MG/ML IJ SOLN: 2.0000 mg | Freq: Four times a day (QID) | INTRAMUSCULAR | Status: DC | PRN

## 2022-08-24 MED ORDER — PIPERACILLIN-TAZOBACTAM 3.375 G IVPB 30 MIN
3.3750 g | Freq: Once | INTRAVENOUS | Status: AC
  Administered 2022-08-24: 3.375 g via INTRAVENOUS
  Filled 2022-08-24: qty 50

## 2022-08-24 MED ORDER — ACETAMINOPHEN 325 MG PO TABS
650.0000 mg | ORAL_TABLET | Freq: Four times a day (QID) | ORAL | Status: DC | PRN
  Filled 2022-08-24: qty 2

## 2022-08-24 MED ORDER — FUROSEMIDE 40 MG PO TABS
40.0000 mg | ORAL_TABLET | Freq: Every day | ORAL | Status: DC
  Administered 2022-08-25 – 2022-08-27 (×2): 40 mg via ORAL
  Filled 2022-08-24 (×2): qty 1

## 2022-08-24 MED ORDER — METOPROLOL SUCCINATE ER 25 MG PO TB24
12.5000 mg | ORAL_TABLET | Freq: Every day | ORAL | Status: DC
  Administered 2022-08-25 – 2022-08-31 (×6): 12.5 mg via ORAL
  Filled 2022-08-24 (×8): qty 1

## 2022-08-24 MED ORDER — HYDRALAZINE HCL 20 MG/ML IJ SOLN
2.0000 mg | Freq: Four times a day (QID) | INTRAMUSCULAR | Status: DC | PRN
  Administered 2022-08-27: 2 mg via INTRAVENOUS
  Filled 2022-08-24: qty 1

## 2022-08-24 MED ORDER — ACETAMINOPHEN 650 MG RE SUPP: 650.0000 mg | Freq: Four times a day (QID) | RECTAL | Status: DC | PRN

## 2022-08-24 MED ORDER — METOPROLOL SUCCINATE ER 25 MG PO TB24: 12.5000 mg | ORAL_TABLET | Freq: Every day | ORAL | Status: DC

## 2022-08-24 MED ORDER — PIPERACILLIN-TAZOBACTAM 3.375 G IVPB
3.3750 g | Freq: Three times a day (TID) | INTRAVENOUS | Status: DC
  Administered 2022-08-24 – 2022-09-01 (×24): 3.375 g via INTRAVENOUS
  Filled 2022-08-24 (×25): qty 50

## 2022-08-24 MED ORDER — METOPROLOL SUCCINATE ER 25 MG PO TB24: 12.5000 mg | ORAL_TABLET | ORAL | Status: DC

## 2022-08-24 MED ORDER — SODIUM CHLORIDE 0.9% FLUSH
3.0000 mL | Freq: Two times a day (BID) | INTRAVENOUS | Status: DC
  Administered 2022-08-24 – 2022-09-01 (×15): 3 mL via INTRAVENOUS

## 2022-08-24 NOTE — ED Triage Notes (Signed)
Pt came by EMS due to right foot pain. Pt has recent osteomyelitis, pt has bone showing on right foot. Hx of diabetes. Axox4. VSS.

## 2022-08-24 NOTE — H&P (Signed)
History and Physical   Liberty Netherland ZOX:[id number] DOB: 03/01/1941 DOA: 08/24/2022  PCP: Sonny Masters, FNP   Patient coming from: Home / Podiatry  Chief Complaint: Osteomyelitis  HPI: Wayne [name] is a 82 y.o. male with medical history significant of diabetes, neuropathy, PAD, hypertension, hyperlipidemia, CKD 3A, subdural hematoma, diastolic CHF, CAD presenting with right foot osteomyelitis.  Patient has had ongoing right foot pain.  He had a previous right fifth metatarsal amputation.  He has been following with wound care, infectious disease, podiatry.  Had been on 8-week course of Augmentin and ciprofloxacin. He followed up with his podiatrist today and they had concern for osteomyelitis which was confirmed on x-ray in their office.  Also noted was a new fracture.  Patient sent to the ED for IV antibiotics, admission and likely surgical intervention.  Patient denies fevers, chills, chest pain, shortness of breath, abdominal pain, constipation, diarrhea, nausea, vomiting.  ED Course: Vital signs in the ED notable for blood pressure in the 180s to 190 systolic, heart rate in the 50s.  Lab workup included CMP with BUN 35, creatinine 1.53 which is near his baseline, glucose 150.  CBC within normal limits.  Lactic acid normal.  Blood cultures pending.  Right foot x-rays were done in the podiatry office and read as not currently visible but was reported.  Podiatry consulted and will see the patient inpatient.  Antibiotics ordered by EDP with assistance of pharmacy, patient started on Zosyn.  Review of Systems: As per HPI otherwise all other systems reviewed and are negative.  Past Medical History:  Diagnosis Date   Anemia of chronic disease    Chronic HFrEF (heart failure with reduced ejection fraction) (HCC)    Chronic kidney disease, stage 3a (HCC)    Coronary artery disease    Dr. Deborah Chalk years ago. No details.   Diabetes mellitus with circulatory complication (HCC)     Diabetic foot infection (HCC)    Encephalopathy    Former tobacco use    Lactic acidosis 11/28/2021   Multifocal pneumonia 11/28/2021   PAD (peripheral artery disease) (HCC)    Peripheral neuropathy    SDH (subdural hematoma) (HCC)    Severe pulmonary hypertension (HCC)     Past Surgical History:  Procedure Laterality Date   ABDOMINAL AORTOGRAM W/LOWER EXTREMITY Right 11/13/2021   Procedure: ABDOMINAL AORTOGRAM W/LOWER EXTREMITY;  Surgeon: Leonie Douglas, MD;  [facility];  Service: Cardiovascular;  Laterality: Right;   AMPUTATION Right 05/23/2022   Procedure: AMPUTATION RAY;  Surgeon: Pilar Plate, DPM;  Location: [facility] OR;  Service: Podiatry;  Laterality: Right;  Partial 5th ray amputation   CORONARY ANGIOPLASTY WITH STENT PLACEMENT     IRRIGATION AND DEBRIDEMENT FOOT Right 05/25/2022   Procedure: IRRIGATION AND DEBRIDEMENT FOOT;  Surgeon: Vivi Barrack, DPM;  Location: [facility] OR;  Service: Podiatry;  Laterality: Right;    Social History  reports that he quit smoking about 31 years ago. His smoking use included cigarettes. He has never used smokeless tobacco. He reports that he does not drink alcohol and does not use drugs.  No Known Allergies  Family History  Problem Relation Age of Onset   Cervical cancer Mother    Down syndrome Sister    Diabetes type II Neg Hx   Reviewed on admission  Prior to Admission medications   Medication Sig Start Date End Date Taking? Authorizing Provider  Accu-Chek Softclix Lancets lancets Use 4 times daily as directed to check blood sugars.  11/16/21   Tyrone Nine, MD  amoxicillin-clavulanate (AUGMENTIN) 875-125 MG tablet Take 1 tablet by mouth 2 (two) times daily. 07/28/22 08/27/22  Danelle Earthly, MD  blood glucose meter kit and supplies KIT Dispense based on patient and insurance preference. Use up to four times daily as directed. 11/15/21   Tyrone Nine, MD  blood glucose meter kit and supplies KIT Use up to four times  daily as directed. 11/16/21   Tyrone Nine, MD  ciclopirox (PENLAC) 8 % solution Apply topically at bedtime. Apply over nail and surrounding skin. Apply daily over previous coat. After seven (7) days, may remove with alcohol and continue cycle. 05/04/22   Candelaria Stagers, DPM  ciprofloxacin (CIPRO) 750 MG tablet Take 1 tablet (750 mg total) by mouth 2 (two) times daily. 07/28/22 08/27/22  Danelle Earthly, MD  dapagliflozin propanediol (FARXIGA) 10 MG TABS tablet Take 1 tablet (10 mg total) by mouth daily. 04/16/22   Jodelle Gross, NP  furosemide (LASIX) 40 MG tablet Take 1 tablet (40 mg total) by mouth daily. 02/17/22   Sonny Masters, FNP  glipiZIDE (GLUCOTROL) 5 MG tablet TAKE 1 TABLET BY MOUTH EVERY DAY BEFORE BREAKFAST 08/24/22   Sonny Masters, FNP  glucose blood test strip Use as instructed 06/13/12   Eber Hong, MD  glucose blood test strip Use 4 times daily as directed to check bloos sugars. 11/16/21   Tyrone Nine, MD  metoprolol succinate (TOPROL-XL) 25 MG 24 hr tablet TAKE 1/2 TABLET BY MOUTH DAILY Patient taking differently: Take 12.5-25 mg by mouth See admin instructions. Take 25 mg in the morning and 12.5 mg at night 07/13/22   Sonny Masters, FNP  Multiple Vitamin (MULTIVITAMIN WITH MINERALS) TABS tablet Take 1 tablet by mouth daily.    [provider]  tamsulosin (FLOMAX) 0.4 MG CAPS capsule Take 1 capsule (0.4 mg total) by mouth daily after breakfast. Patient not taking: Reported on 08/19/2022 05/28/22 08/26/22  Lorin Glass, MD    Physical Exam: Vitals:   08/24/22 1053 08/24/22 1054 08/24/22 1100 08/24/22 1200  BP:  (!) 195/83  (!) 186/67  Pulse:  (!) 58 (!) 58 (!) 56  Resp:  19 (!) 23 18  Temp:   97.7 F (36.5 C)   TempSrc:   Oral   SpO2:  99% 100% 97%  Weight: 78.9 kg     Height: 6' (1.829 m)       Physical Exam Constitutional:      General: He is not in acute distress.    Appearance: Normal appearance.  HENT:     Head: Normocephalic and atraumatic.      Mouth/Throat:     Mouth: Mucous membranes are moist.     Pharynx: Oropharynx is clear.  Eyes:     Extraocular Movements: Extraocular movements intact.     Pupils: Pupils are equal, round, and reactive to light.  Cardiovascular:     Rate and Rhythm: Normal rate and regular rhythm.     Pulses: Normal pulses.     Heart sounds: Normal heart sounds.  Pulmonary:     Effort: Pulmonary effort is normal. No respiratory distress.     Breath sounds: Normal breath sounds.  Abdominal:     General: Bowel sounds are normal. There is no distension.     Palpations: Abdomen is soft.     Tenderness: There is no abdominal tenderness.  Musculoskeletal:        General: No swelling or deformity.  Comments: Dehisced/debrided R 5th tarsal amputation site.  Right fourth metatarsal bone exposed.  Skin:    General: Skin is warm and dry.  Neurological:     General: No focal deficit present.     Mental Status: Mental status is at baseline.    Labs on Admission: I have personally reviewed following labs and imaging studies  CBC: Recent Labs  Lab 08/19/22 1432 08/24/22 1105  WBC 6.6 5.5  NEUTROABS 3.5 3.2  HGB 13.2 14.9  HCT 39.3 47.3  MCV 83 86.0  PLT 195 205    Basic Metabolic Panel: Recent Labs  Lab 08/19/22 1432 08/24/22 1105  NA 139 135  K 4.5 4.4  CL 102 104  CO2 20 22  GLUCOSE 89 150*  BUN 43* 35*  CREATININE 1.48* 1.53*  CALCIUM 9.3 9.1    GFR: Estimated Creatinine Clearance: 40.2 mL/min (A) (by C-G formula based on SCr of 1.53 mg/dL (H)).  Liver Function Tests: Recent Labs  Lab 08/19/22 1432 08/24/22 1105  AST 12 14*  ALT 9 13  ALKPHOS 128* 99  BILITOT 0.8 0.8  PROT 6.9 8.0  ALBUMIN 4.0 3.7    Urine analysis:    Component Value Date/Time   COLORURINE YELLOW 11/28/2021 0949   APPEARANCEUR CLEAR 11/28/2021 0949   LABSPEC 1.022 11/28/2021 0949   PHURINE 5.0 11/28/2021 0949   GLUCOSEU >=500 (A) 11/28/2021 0949   HGBUR SMALL (A) 11/28/2021 0949   BILIRUBINUR  NEGATIVE 11/28/2021 0949   KETONESUR 20 (A) 11/28/2021 0949   PROTEINUR 100 (A) 11/28/2021 0949   UROBILINOGEN 0.2 06/13/2012 0325   NITRITE NEGATIVE 11/28/2021 0949   LEUKOCYTESUR NEGATIVE 11/28/2021 0949    Radiological Exams on Admission: No results found.  EKG: Independently reviewed.  Sinus rhythm at 58 bpm.  Some baseline artifact.  Nonspecific T wave flattening.  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 > Has had ongoing foot pain and was at podiatry's office today for debridement/follow-up.  Concern for right fourth metatarsal changes. > X-ray in the office reportedly confirmed fracture and osteomyelitis.  Patient sent to the ED for IV antibiotics and possible surgical intervention. > Patient started on Zosyn in the ED, podiatry was consulted and will see the patient inpatient. - Monitor on telemetry overnight - Appreciate podiatry recommendations and assistance - Continue with Zosyn - Trend fever curve and WBC - Follow-up blood cultures  PAD > Known history of PAD.  Did have angiogram in August 2023 with runoff.  This was notable for right pedal circulation provided only by posterior tibial artery.  This is relevant to healing in the setting of osteomyelitis above. - Had previously been on aspirin and do not see this on patient's medication list currently  Diabetes - SSI  Hypertension - Continue home Lasix  metoprolol  Hyperlipidemia - Do not currently see patient on any medications for this  CKD 3a > Creatinine remains near baseline at 1.53. - Trend renal function and electrolytes  Chronic diastolic CHF > Echo earlier this  year with EF 50-55%, G2 DD, normal RV function. - Continue home Lasix and metoprolol  CAD > Chart review shows history of angioplasty. - Continue metoprolol  DVT prophylaxis: SCDs for now Code Status:   DNR Family Communication:  Attempted to contact patient's daughter, [name], by phone but there was no answer. Disposition Plan:   Patient is from:  Home  Anticipated DC to:  Home  Anticipated DC date:  1 to 4 days  Anticipated DC barriers: None  Consults called:  Podiatry Admission status:  Observation, telemetry  Severity of Illness: The appropriate patient status for this patient is OBSERVATION. Observation status is judged to be reasonable and necessary in order to provide the required intensity of service to ensure the patient's safety. The patient's presenting symptoms, physical exam findings, and initial radiographic and laboratory data in the context of their medical condition is felt to place them at decreased risk for further clinical deterioration. Furthermore, it is anticipated that the patient will be medically stable for discharge from the hospital within 2 midnights of admission.    Synetta Fail MD [facility]  How to contact the [facility] Attending or Consulting provider 7A - 7P or covering provider during after hours 7P -7A, for this patient?   Check the care team in [facility] and look for a) attending/consulting [facility] provider listed and b) the [facility] team listed Log into [email or web address] and use [facility] universal password to access. If you do not have the password, please contact the hospital operator. Locate the [facility] provider you are looking for under [facility] and page to a number that you can be directly reached. If you still have  difficulty reaching the provider, please page the [facility] (Director on Call) for the Hospitalists listed on amion for assistance.  08/24/2022, 1:00 PM

## 2022-08-24 NOTE — Progress Notes (Signed)
Pt heading to MRI.

## 2022-08-24 NOTE — ED Notes (Signed)
ED TO INPATIENT HANDOFF REPORT  ED Nurse Name and Phone #: Alta Goding [phone]  S Name/Age/Gender Wayne [name] 82 y.o. male Room/Bed: 026C/026C  Code Status   Code Status: DNR  Home/SNF/Other Home Patient oriented to: self, place, time, and situation Is this baseline? Yes   Triage Complete: Triage complete  Chief Complaint Osteomyelitis [facility]) [M86.9]  Triage Note Pt came by EMS due to right foot pain. Pt has recent osteomyelitis, pt has bone showing on right foot. Hx of diabetes. Axox4. VSS.    Allergies No Known Allergies  Level of Care/Admitting Diagnosis ED Disposition     ED Disposition  Admit   Condition  --   Comment  Hospital Area: [facility] [[id number]]  Level of Care: Telemetry Surgical [105]  May place patient in observation at [facility] or [facility] if equivalent level of care is available:: No  Covid Evaluation: Asymptomatic - no recent exposure (last 10 days) testing not required  Diagnosis: Osteomyelitis [facility]) [[id number]]  Admitting Physician: Synetta Fail [[id number]]  Attending Physician: Synetta Fail [[id number]]          B Medical/Surgery History Past Medical History:  Diagnosis Date   Anemia of chronic disease    Chronic HFrEF (heart failure with reduced ejection fraction) (HCC)    Chronic kidney disease, stage 3a (HCC)    Coronary artery disease    Dr. Deborah Chalk years ago. No details.   Diabetes mellitus with circulatory complication (HCC)    Diabetic foot infection (HCC)    Encephalopathy    Former tobacco use    Lactic acidosis 11/28/2021   Multifocal pneumonia 11/28/2021   PAD (peripheral artery disease) (HCC)    Peripheral neuropathy    SDH (subdural hematoma) (HCC)    Severe pulmonary hypertension (HCC)    Past Surgical History:  Procedure Laterality Date   ABDOMINAL AORTOGRAM W/LOWER EXTREMITY Right 11/13/2021   Procedure: ABDOMINAL AORTOGRAM W/LOWER EXTREMITY;  Surgeon: Leonie Douglas, MD;   [facility];  Service: Cardiovascular;  Laterality: Right;   AMPUTATION Right 05/23/2022   Procedure: AMPUTATION RAY;  Surgeon: Pilar Plate, DPM;  Location: [facility] OR;  Service: Podiatry;  Laterality: Right;  Partial 5th ray amputation   CORONARY ANGIOPLASTY WITH STENT PLACEMENT     IRRIGATION AND DEBRIDEMENT FOOT Right 05/25/2022   Procedure: IRRIGATION AND DEBRIDEMENT FOOT;  Surgeon: Vivi Barrack, DPM;  Location: [facility] OR;  Service: Podiatry;  Laterality: Right;     A IV Location/Drains/Wounds Patient Lines/Drains/Airways Status     Active Line/Drains/Airways     Name Placement date Placement time Site Days   Peripheral IV 08/24/22 18 G Anterior;Right Forearm 08/24/22  1239  Forearm  less than 1   Wound / Incision (Open or Dehisced) 11/11/21 Other (Comment) Foot Anterior;Right Open wound to second metatarsal 11/11/21  2010  Foot  286   Wound / Incision (Open or Dehisced) 11/28/21 Skin tear Hand Right 11/28/21  1833  Hand  269   Wound / Incision (Open or Dehisced) 05/21/22 Other (Comment) Other (Comment) Right 05/21/22  2030  Other (Comment)  95            Intake/Output Last 24 hours No intake or output data in the 24 hours ending 08/24/22 1320  Labs/Imaging Results for orders placed or performed during the hospital encounter of 08/24/22 (from the past 48 hour(s))  CBG monitoring, ED     Status: Abnormal   Collection Time: 08/24/22 10:59 AM  Result Value  Ref Range   Glucose-Capillary 156 (H) 70 - 99 mg/dL    Comment: Glucose reference range applies only to samples taken after fasting for at least 8 hours.  Comprehensive metabolic panel     Status: Abnormal   Collection Time: 08/24/22 11:05 AM  Result Value Ref Range   Sodium 135 135 - 145 mmol/L   Potassium 4.4 3.5 - 5.1 mmol/L   Chloride 104 98 - 111 mmol/L   CO2 22 22 - 32 mmol/L   Glucose, Bld 150 (H) 70 - 99 mg/dL    Comment: Glucose reference range applies only to samples taken after fasting  for at least 8 hours.   BUN 35 (H) 8 - 23 mg/dL   Creatinine, Ser 4.09 (H) 0.61 - 1.24 mg/dL   Calcium 9.1 8.9 - 81.1 mg/dL   Total Protein 8.0 6.5 - 8.1 g/dL   Albumin 3.7 3.5 - 5.0 g/dL   AST 14 (L) 15 - 41 U/L   ALT 13 0 - 44 U/L   Alkaline Phosphatase 99 38 - 126 U/L   Total Bilirubin 0.8 0.3 - 1.2 mg/dL   GFR, Estimated 45 (L) >60 mL/min    Comment: (NOTE) Calculated using the CKD-EPI Creatinine Equation (2021)    Anion gap 9 5 - 15    Comment: Performed at [facility], [address], [place], [place] [address]  CBC with Differential     Status: None   Collection Time: 08/24/22 11:05 AM  Result Value Ref Range   WBC 5.5 4.0 - 10.5 K/uL   RBC 5.50 4.22 - 5.81 MIL/uL   Hemoglobin 14.9 13.0 - 17.0 g/dL   HCT 29.5 62.1 - 30.8 %   MCV 86.0 80.0 - 100.0 fL   MCH 27.1 26.0 - 34.0 pg   MCHC 31.5 30.0 - 36.0 g/dL   RDW 65.7 84.6 - 96.2 %   Platelets 205 150 - 400 K/uL   nRBC 0.0 0.0 - 0.2 %   Neutrophils Relative % 57 %   Neutro Abs 3.2 1.7 - 7.7 K/uL   Lymphocytes Relative 28 %   Lymphs Abs 1.5 0.7 - 4.0 K/uL   Monocytes Relative 8 %   Monocytes Absolute 0.4 0.1 - 1.0 K/uL   Eosinophils Relative 5 %   Eosinophils Absolute 0.3 0.0 - 0.5 K/uL   Basophils Relative 1 %   Basophils Absolute 0.0 0.0 - 0.1 K/uL   Immature Granulocytes 1 %   Abs Immature Granulocytes 0.03 0.00 - 0.07 K/uL    Comment: Performed at [facility], [address] [place], [place] [address]  Lactic acid     Status: None   Collection Time: 08/24/22 11:05 AM  Result Value Ref Range   Lactic Acid, Venous 1.1 0.5 - 1.9 mmol/L    Comment: Performed at [facility], [address] [place], [place] [address]   No results found.  Pending Labs Unresulted Labs (From admission, onward)     Start     Ordered   08/25/22 0500  Comprehensive metabolic panel  Tomorrow morning,   R        08/24/22 1256   08/25/22 0500  CBC  Tomorrow morning,   R        08/24/22 1256   08/24/22 1056   Blood Cultures x 2 sites  BLOOD CULTURE X 2,   STAT      08/24/22 1056  Vitals/Pain Today's Vitals   08/24/22 1053 08/24/22 1054 08/24/22 1100 08/24/22 1200  BP:  (!) 195/83  (!) 186/67  Pulse:  (!) 58 (!) 58 (!) 56  Resp:  19 (!) 23 18  Temp:   97.7 F (36.5 C)   TempSrc:   Oral   SpO2:  99% 100% 97%  Weight: 174 lb (78.9 kg)     Height: 6' (1.829 m)     PainSc:        Isolation Precautions No active isolations  Medications Medications  piperacillin-tazobactam (ZOSYN) IVPB 3.375 g (3.375 g Intravenous New Bag/Given 08/24/22 1308)  piperacillin-tazobactam (ZOSYN) IVPB 3.375 g (has no administration in time range)  furosemide (LASIX) tablet 40 mg (has no administration in time range)  sodium chloride flush (NS) 0.9 % injection 3 mL (3 mLs Intravenous Not Given 08/24/22 1309)  acetaminophen (TYLENOL) tablet 650 mg (has no administration in time range)    Or  acetaminophen (TYLENOL) suppository 650 mg (has no administration in time range)  metoprolol succinate (TOPROL-XL) 24 hr tablet 25 mg (has no administration in time range)  metoprolol succinate (TOPROL-XL) 24 hr tablet 12.5 mg (has no administration in time range)    Mobility manual wheelchair     Focused Assessments Cardiac Assessment Handoff:    Lab Results  Component Value Date   CKTOTAL 66 11/28/2021   No results found for: "DDIMER" Does the Patient currently have chest pain? No    R Recommendations: See Admitting Provider Note  Report given to:   Additional Notes:

## 2022-08-24 NOTE — Progress Notes (Signed)
Subjective: Chief Complaint  Patient presents with   Foot Ulcer    Rm 11 Right foot ulcer check. The bone is sticking out of pt foot that has worsen in the past few days. Pt could not wait to see Dr. Annamary Rummage. Pt also has a new sore on his right great toe. Pt is on his 2nd round of antibiotics.     82 year old male presents for above concerns.  Patient stated for an acute appointment with his family.  States that it looks like the bone is sticking out more from the wound.  Objective: AAO x3, NAD Previous partial fifth ray amputation.  There is exposed fourth metatarsal head from the side of the wound is not present of the foot.  There is no fluctuation or crepitation but there is an odor. No pain with calf compression, swelling, warmth, erythema  Assessment: Right foot osteomyelitis, exposed metatarsal head  Plan: -All treatment options discussed with the patient including all alternatives, risks, complications.  -X-rays obtained reviewed.  3 views of the foot were obtained.  Osteolysis noted along the fourth metatarsal head, neck. -Given the worsening of wound and exposed metatarsal head I recommended surgery.  He has previously seen vascular surgery in the summer to try to salvage the foot.  Given the acute worsening of the wound I recommend the patient to go to the emergency room.  Not able to be transported so he was transported from the nonemergent medical transportation to the hospital where he needs to be admitted for IV device, MRI and likely plan for surgery later this week.  Discussed the case with Dr. Logan Bores who is covering for our office.  Vivi Barrack DPM

## 2022-08-24 NOTE — ED Provider Notes (Signed)
[place] EMERGENCY DEPARTMENT AT [facility] Provider Note   CSN: [id number] Arrival date & time: 08/24/22  1051     History  Chief Complaint  Patient presents with   Foot Pain    Lavoy Cusano is a 82 y.o. male.  HPI Level 5 caveat secondary to patient's elevated to give full history 82 year old male history of type 2 diabetes, dyslipidemia, peripheral artery disease, status post partial amputation of right foot presents today from podiatry office with concern for new osteomyelitis.  I am able to see the patient was seen in podiatry office today and sent to the ED.  Full note is not available at this time.     Home Medications Prior to Admission medications   Medication Sig Start Date End Date Taking? Authorizing Provider  Accu-Chek Softclix Lancets lancets Use 4 times daily as directed to check blood sugars. 11/16/21   Tyrone Nine, MD  blood glucose meter kit and supplies KIT Dispense based on patient and insurance preference. Use up to four times daily as directed. 11/15/21   Tyrone Nine, MD  blood glucose meter kit and supplies KIT Use up to four times daily as directed. 11/16/21   Tyrone Nine, MD  ciclopirox (PENLAC) 8 % solution Apply topically at bedtime. Apply over nail and surrounding skin. Apply daily over previous coat. After seven (7) days, may remove with alcohol and continue cycle. 05/04/22   Candelaria Stagers, DPM  dapagliflozin propanediol (FARXIGA) 10 MG TABS tablet Take 1 tablet (10 mg total) by mouth daily. 04/16/22   Jodelle Gross, NP  furosemide (LASIX) 40 MG tablet Take 1 tablet (40 mg total) by mouth daily. 02/17/22   Sonny Masters, FNP  glipiZIDE (GLUCOTROL) 5 MG tablet TAKE 1 TABLET BY MOUTH EVERY DAY BEFORE BREAKFAST 08/24/22   Sonny Masters, FNP  glucose blood test strip Use as instructed 06/13/12   Eber Hong, MD  glucose blood test strip Use 4 times daily as directed to check bloos sugars. 11/16/21   Tyrone Nine, MD  metoprolol  succinate (TOPROL-XL) 25 MG 24 hr tablet TAKE 1/2 TABLET BY MOUTH DAILY Patient taking differently: Take 12.5-25 mg by mouth See admin instructions. Take 25 mg in the morning and 12.5 mg at night 07/13/22   Sonny Masters, FNP  Multiple Vitamin (MULTIVITAMIN WITH MINERALS) TABS tablet Take 1 tablet by mouth daily.    [provider]  tamsulosin (FLOMAX) 0.4 MG CAPS capsule Take 1 capsule (0.4 mg total) by mouth daily after breakfast. Patient not taking: Reported on 08/19/2022 05/28/22 08/26/22  Lorin Glass, MD      Allergies    Patient has no known allergies.    Review of Systems   Review of Systems  Physical Exam Updated Vital Signs BP (!) 186/67 (BP Location: Right Arm)   Pulse (!) 56   Temp 97.7 F (36.5 C) (Oral)   Resp 18   Ht 1.829 m (6')   Wt 78.9 kg   SpO2 97%   BMI 23.60 kg/m  Physical Exam Vitals reviewed.  Constitutional:      Appearance: Normal appearance.  HENT:     Head: Normocephalic.     Right Ear: External ear normal.     Left Ear: External ear normal.     Nose: Nose normal.     Mouth/Throat:     Pharynx: Oropharynx is clear.  Eyes:     Pupils: Pupils are equal, round, and reactive to  light.  Cardiovascular:     Rate and Rhythm: Normal rate and regular rhythm.     Pulses: Normal pulses.  Pulmonary:     Effort: Pulmonary effort is normal.     Breath sounds: Normal breath sounds.  Abdominal:     Palpations: Abdomen is soft.  Musculoskeletal:        General: Normal range of motion.     Cervical back: Normal range of motion.     Comments: Right foot with exposed bone, erythema, no fluctuance some ttp Dp pulse thready but present  Skin:    General: Skin is warm and dry.     Capillary Refill: Capillary refill takes less than 2 seconds.  Neurological:     General: No focal deficit present.     Mental Status: He is alert.  Psychiatric:        Mood and Affect: Mood normal.     ED Results / Procedures / Treatments   Labs (all labs ordered  are listed, but only abnormal results are displayed) Labs Reviewed  COMPREHENSIVE METABOLIC PANEL - Abnormal; Notable for the following components:      Result Value   Glucose, Bld 150 (*)    BUN 35 (*)    Creatinine, Ser 1.53 (*)    AST 14 (*)    GFR, Estimated 45 (*)    All other components within normal limits  CBG MONITORING, ED - Abnormal; Notable for the following components:   Glucose-Capillary 156 (*)    All other components within normal limits  CULTURE, BLOOD (ROUTINE X 2)  CULTURE, BLOOD (ROUTINE X 2)  CBC WITH DIFFERENTIAL/PLATELET  LACTIC ACID, PLASMA    EKG None  Radiology No results found.  Procedures Procedures    Medications Ordered in ED Medications  piperacillin-tazobactam (ZOSYN) IVPB 3.375 g (has no administration in time range)  piperacillin-tazobactam (ZOSYN) IVPB 3.375 g (has no administration in time range)  metoprolol succinate (TOPROL-XL) 24 hr tablet 12.5-25 mg (has no administration in time range)  furosemide (LASIX) tablet 40 mg (has no administration in time range)  sodium chloride flush (NS) 0.9 % injection 3 mL (has no administration in time range)  acetaminophen (TYLENOL) tablet 650 mg (has no administration in time range)    Or  acetaminophen (TYLENOL) suppository 650 mg (has no administration in time range)    ED Course/ Medical Decision Making/ A&P                             Medical Decision Making Amount and/or Complexity of Data Reviewed Labs: ordered.  Risk Prescription drug management. Decision regarding hospitalization.   82 year old male with known foot infection, status post fifth metatarsal amputation several months ago presents today with worsening osteomyelitis.  Patient evaluated here with labs.  He had imaging done in the podiatry office that shows new fracture and osteomyelitis of the fourth metatarsal. Consult to pharmacy for assistance with antibiotic choice as patient has recently been on Augmentin and  Cipro. Consult to podiatry placed.  They have been managing patient Aortogram done and August 2023 showed patient's with the distal lower right extremity except for the pedal distribution which was "disadvantaged" Discussed with Dr. Logan Bores.  He is on-call for podiatry.  He advises that they will see in consultation inpatient.  He was advised that the patient may need further vascular studies. Discussed with pharmacist and they will enter Zosyn for antibiotics Unassigned medicine has been paged  Final Clinical Impression(s) / ED Diagnoses Final diagnoses:  Osteomyelitis of right foot, unspecified type [facility])    Rx / DC Orders ED Discharge Orders     None         Margarita Grizzle, MD 08/24/22 1257

## 2022-08-24 NOTE — ED Notes (Signed)
Help get patient into a down on the monitor patient is resting with call bell in reach

## 2022-08-25 DIAGNOSIS — I2510 Atherosclerotic heart disease of native coronary artery without angina pectoris: Secondary | ICD-10-CM | POA: Diagnosis not present

## 2022-08-25 DIAGNOSIS — I110 Hypertensive heart disease with heart failure: Secondary | ICD-10-CM | POA: Diagnosis not present

## 2022-08-25 DIAGNOSIS — E1169 Type 2 diabetes mellitus with other specified complication: Secondary | ICD-10-CM | POA: Diagnosis not present

## 2022-08-25 DIAGNOSIS — R262 Difficulty in walking, not elsewhere classified: Secondary | ICD-10-CM | POA: Diagnosis not present

## 2022-08-25 DIAGNOSIS — Z89421 Acquired absence of other right toe(s): Secondary | ICD-10-CM | POA: Diagnosis not present

## 2022-08-25 DIAGNOSIS — E1143 Type 2 diabetes mellitus with diabetic autonomic (poly)neuropathy: Secondary | ICD-10-CM | POA: Diagnosis not present

## 2022-08-25 DIAGNOSIS — R140 Abdominal distension (gaseous): Secondary | ICD-10-CM | POA: Diagnosis not present

## 2022-08-25 DIAGNOSIS — Z66 Do not resuscitate: Secondary | ICD-10-CM | POA: Diagnosis present

## 2022-08-25 DIAGNOSIS — I130 Hypertensive heart and chronic kidney disease with heart failure and stage 1 through stage 4 chronic kidney disease, or unspecified chronic kidney disease: Secondary | ICD-10-CM | POA: Diagnosis present

## 2022-08-25 DIAGNOSIS — I509 Heart failure, unspecified: Secondary | ICD-10-CM | POA: Diagnosis not present

## 2022-08-25 DIAGNOSIS — Z8619 Personal history of other infectious and parasitic diseases: Secondary | ICD-10-CM | POA: Diagnosis not present

## 2022-08-25 DIAGNOSIS — M87874 Other osteonecrosis, right foot: Secondary | ICD-10-CM | POA: Diagnosis not present

## 2022-08-25 DIAGNOSIS — K5641 Fecal impaction: Secondary | ICD-10-CM | POA: Diagnosis not present

## 2022-08-25 DIAGNOSIS — Z7984 Long term (current) use of oral hypoglycemic drugs: Secondary | ICD-10-CM | POA: Diagnosis not present

## 2022-08-25 DIAGNOSIS — R2681 Unsteadiness on feet: Secondary | ICD-10-CM | POA: Diagnosis not present

## 2022-08-25 DIAGNOSIS — I152 Hypertension secondary to endocrine disorders: Secondary | ICD-10-CM | POA: Diagnosis not present

## 2022-08-25 DIAGNOSIS — E1122 Type 2 diabetes mellitus with diabetic chronic kidney disease: Secondary | ICD-10-CM | POA: Diagnosis not present

## 2022-08-25 DIAGNOSIS — E1159 Type 2 diabetes mellitus with other circulatory complications: Secondary | ICD-10-CM | POA: Diagnosis not present

## 2022-08-25 DIAGNOSIS — I951 Orthostatic hypotension: Secondary | ICD-10-CM | POA: Diagnosis not present

## 2022-08-25 DIAGNOSIS — Z7401 Bed confinement status: Secondary | ICD-10-CM | POA: Diagnosis not present

## 2022-08-25 DIAGNOSIS — Z794 Long term (current) use of insulin: Secondary | ICD-10-CM

## 2022-08-25 DIAGNOSIS — Z9861 Coronary angioplasty status: Secondary | ICD-10-CM | POA: Diagnosis not present

## 2022-08-25 DIAGNOSIS — E1140 Type 2 diabetes mellitus with diabetic neuropathy, unspecified: Secondary | ICD-10-CM | POA: Diagnosis present

## 2022-08-25 DIAGNOSIS — M879 Osteonecrosis, unspecified: Secondary | ICD-10-CM | POA: Diagnosis present

## 2022-08-25 DIAGNOSIS — M86171 Other acute osteomyelitis, right ankle and foot: Secondary | ICD-10-CM

## 2022-08-25 DIAGNOSIS — Z89431 Acquired absence of right foot: Secondary | ICD-10-CM | POA: Diagnosis not present

## 2022-08-25 DIAGNOSIS — R531 Weakness: Secondary | ICD-10-CM | POA: Diagnosis not present

## 2022-08-25 DIAGNOSIS — R488 Other symbolic dysfunctions: Secondary | ICD-10-CM | POA: Diagnosis not present

## 2022-08-25 DIAGNOSIS — Z4781 Encounter for orthopedic aftercare following surgical amputation: Secondary | ICD-10-CM | POA: Diagnosis not present

## 2022-08-25 DIAGNOSIS — S98311D Complete traumatic amputation of right midfoot, subsequent encounter: Secondary | ICD-10-CM | POA: Diagnosis not present

## 2022-08-25 DIAGNOSIS — N179 Acute kidney failure, unspecified: Secondary | ICD-10-CM | POA: Diagnosis present

## 2022-08-25 DIAGNOSIS — N1831 Chronic kidney disease, stage 3a: Secondary | ICD-10-CM | POA: Diagnosis not present

## 2022-08-25 DIAGNOSIS — Z955 Presence of coronary angioplasty implant and graft: Secondary | ICD-10-CM | POA: Diagnosis not present

## 2022-08-25 DIAGNOSIS — Z87891 Personal history of nicotine dependence: Secondary | ICD-10-CM | POA: Diagnosis not present

## 2022-08-25 DIAGNOSIS — M84474A Pathological fracture, right foot, initial encounter for fracture: Secondary | ICD-10-CM | POA: Diagnosis present

## 2022-08-25 DIAGNOSIS — E8809 Other disorders of plasma-protein metabolism, not elsewhere classified: Secondary | ICD-10-CM | POA: Diagnosis present

## 2022-08-25 DIAGNOSIS — E1151 Type 2 diabetes mellitus with diabetic peripheral angiopathy without gangrene: Secondary | ICD-10-CM | POA: Diagnosis not present

## 2022-08-25 DIAGNOSIS — E440 Moderate protein-calorie malnutrition: Secondary | ICD-10-CM | POA: Diagnosis not present

## 2022-08-25 DIAGNOSIS — E1141 Type 2 diabetes mellitus with diabetic mononeuropathy: Secondary | ICD-10-CM | POA: Diagnosis not present

## 2022-08-25 DIAGNOSIS — M7731 Calcaneal spur, right foot: Secondary | ICD-10-CM | POA: Diagnosis not present

## 2022-08-25 DIAGNOSIS — M6701 Short Achilles tendon (acquired), right ankle: Secondary | ICD-10-CM | POA: Diagnosis not present

## 2022-08-25 DIAGNOSIS — M869 Osteomyelitis, unspecified: Secondary | ICD-10-CM | POA: Diagnosis not present

## 2022-08-25 DIAGNOSIS — M216X1 Other acquired deformities of right foot: Secondary | ICD-10-CM | POA: Diagnosis not present

## 2022-08-25 DIAGNOSIS — M6281 Muscle weakness (generalized): Secondary | ICD-10-CM | POA: Diagnosis not present

## 2022-08-25 DIAGNOSIS — M79671 Pain in right foot: Secondary | ICD-10-CM | POA: Diagnosis present

## 2022-08-25 DIAGNOSIS — D649 Anemia, unspecified: Secondary | ICD-10-CM | POA: Diagnosis not present

## 2022-08-25 DIAGNOSIS — E785 Hyperlipidemia, unspecified: Secondary | ICD-10-CM | POA: Diagnosis not present

## 2022-08-25 DIAGNOSIS — Z978 Presence of other specified devices: Secondary | ICD-10-CM | POA: Diagnosis not present

## 2022-08-25 DIAGNOSIS — E43 Unspecified severe protein-calorie malnutrition: Secondary | ICD-10-CM | POA: Diagnosis not present

## 2022-08-25 DIAGNOSIS — I5032 Chronic diastolic (congestive) heart failure: Secondary | ICD-10-CM | POA: Diagnosis not present

## 2022-08-25 DIAGNOSIS — I739 Peripheral vascular disease, unspecified: Secondary | ICD-10-CM | POA: Diagnosis not present

## 2022-08-25 DIAGNOSIS — Z1152 Encounter for screening for COVID-19: Secondary | ICD-10-CM | POA: Diagnosis not present

## 2022-08-25 DIAGNOSIS — Z79899 Other long term (current) drug therapy: Secondary | ICD-10-CM | POA: Diagnosis not present

## 2022-08-25 DIAGNOSIS — I272 Pulmonary hypertension, unspecified: Secondary | ICD-10-CM | POA: Diagnosis not present

## 2022-08-25 DIAGNOSIS — M86671 Other chronic osteomyelitis, right ankle and foot: Secondary | ICD-10-CM | POA: Diagnosis not present

## 2022-08-25 DIAGNOSIS — R770 Abnormality of albumin: Secondary | ICD-10-CM | POA: Diagnosis not present

## 2022-08-25 DIAGNOSIS — E876 Hypokalemia: Secondary | ICD-10-CM | POA: Diagnosis present

## 2022-08-25 LAB — COMPREHENSIVE METABOLIC PANEL
ALT: 10 U/L (ref 0–44)
AST: 13 U/L — ABNORMAL LOW (ref 15–41)
Albumin: 3.3 g/dL — ABNORMAL LOW (ref 3.5–5.0)
Alkaline Phosphatase: 91 U/L (ref 38–126)
Anion gap: 8 (ref 5–15)
BUN: 31 mg/dL — ABNORMAL HIGH (ref 8–23)
CO2: 26 mmol/L (ref 22–32)
Calcium: 9.1 mg/dL (ref 8.9–10.3)
Chloride: 104 mmol/L (ref 98–111)
Creatinine, Ser: 1.52 mg/dL — ABNORMAL HIGH (ref 0.61–1.24)
GFR, Estimated: 45 mL/min — ABNORMAL LOW (ref >60)
Glucose, Bld: 115 mg/dL — ABNORMAL HIGH (ref 70–99)
Potassium: 4.6 mmol/L (ref 3.5–5.1)
Sodium: 138 mmol/L (ref 135–145)
Total Bilirubin: 1.1 mg/dL (ref 0.3–1.2)
Total Protein: 7.1 g/dL (ref 6.5–8.1)

## 2022-08-25 LAB — CBC
HCT: 45.7 % (ref 39.0–52.0)
Hemoglobin: 15.1 g/dL (ref 13.0–17.0)
MCH: 28.2 pg (ref 26.0–34.0)
MCHC: 33 g/dL (ref 30.0–36.0)
MCV: 85.3 fL (ref 80.0–100.0)
Platelets: 210 10*3/uL (ref 150–400)
RBC: 5.36 MIL/uL (ref 4.22–5.81)
RDW: 13.7 % (ref 11.5–15.5)
WBC: 6.3 10*3/uL (ref 4.0–10.5)
nRBC: 0 % (ref 0.0–0.2)

## 2022-08-25 LAB — GLUCOSE, CAPILLARY
Glucose-Capillary: 139 mg/dL — ABNORMAL HIGH (ref 70–99)
Glucose-Capillary: 175 mg/dL — ABNORMAL HIGH (ref 70–99)
Glucose-Capillary: 142 mg/dL — ABNORMAL HIGH (ref 70–99)

## 2022-08-25 MED ORDER — NEPRO/CARBSTEADY PO LIQD
237.0000 mL | Freq: Two times a day (BID) | ORAL | Status: DC
  Administered 2022-08-25 – 2022-09-01 (×12): 237 mL via ORAL

## 2022-08-25 MED ORDER — PROSOURCE PLUS PO LIQD
30.0000 mL | Freq: Two times a day (BID) | ORAL | Status: DC
  Administered 2022-08-27 – 2022-09-01 (×10): 30 mL via ORAL
  Filled 2022-08-25 (×10): qty 30

## 2022-08-25 MED ORDER — GLUCERNA SHAKE PO LIQD
237.0000 mL | Freq: Three times a day (TID) | ORAL | Status: DC
  Administered 2022-08-25: 237 mL via ORAL

## 2022-08-25 MED ORDER — ADULT MULTIVITAMIN W/MINERALS CH
1.0000 | ORAL_TABLET | Freq: Every day | ORAL | Status: DC
  Administered 2022-08-27 – 2022-09-01 (×6): 1 via ORAL
  Filled 2022-08-25 (×6): qty 1

## 2022-08-25 MED ORDER — NYSTATIN 100000 UNIT/GM EX CREA
TOPICAL_CREAM | Freq: Two times a day (BID) | CUTANEOUS | Status: DC
  Filled 2022-08-25: qty 30

## 2022-08-25 MED ORDER — ZINC SULFATE 220 (50 ZN) MG PO CAPS
220.0000 mg | ORAL_CAPSULE | Freq: Every day | ORAL | Status: DC
  Administered 2022-08-27 – 2022-09-01 (×6): 220 mg via ORAL
  Filled 2022-08-25 (×6): qty 1

## 2022-08-25 MED ORDER — VITAMIN C 500 MG PO TABS
500.0000 mg | ORAL_TABLET | Freq: Two times a day (BID) | ORAL | Status: DC
  Administered 2022-08-25 – 2022-09-01 (×13): 500 mg via ORAL
  Filled 2022-08-25 (×13): qty 1

## 2022-08-25 NOTE — Progress Notes (Signed)
PROGRESS NOTE    Wayne [name]  ZOX:[id number] DOB: Apr 08, 1940 DOA: 08/24/2022 PCP: Sonny Masters, FNP   Brief Narrative:  The patient is an 82 year old Caucasian male with a past medical history significant for but limited to diabetes mellitus type 2, neuropathy, PAD, hypertension, hyperlipidemia, CKD stage IIIa, subdural hematoma, chronic diastolic CHF, CAD as well as other comorbidities who presented with right foot osteomyelitis and pain.  He has been having ongoing right foot pain and he had a previous right fifth metatarsal amputation and has been following with wound care, infectious disease and podiatry in outpatient setting.  He been on 8-week course of Augmentin and ciprofloxacin and follow-up with his podiatrist yesterday and there is concern for osteomyelitis which was confirmed on the x-ray in the office and there is also new fracture noted.  He was sent to the ED for IV antibiotics and admission for surgical intervention.  In the ED he was noted to have elevated blood pressures and heart rate in the 50s.  Lab workup was revealing BUN/creatinine of 35/1.35.  Blood cultures obtained and right foot x-rays were done in the podiatry office were not currently visible or reported.  Podiatry recommended MRI and antibiotics and he was started on IV Zosyn.  ID has been consulted and they are recommending continue IV Zosyn pending further surgical intervention and plan is for TMA tomorrow of his right foot with a bone biopsy.   Assessment and Plan: No notes have been filed under this hospital service. Service: Hospitalist  Osteomyelitis of right fourth metatarsal -History of prior foot infection status amputation of right fifth metatarsal. -Has had ongoing foot pain and was at podiatry's office today for debridement/follow-up.  Concern for right fourth metatarsal changes. -X-ray in the office reportedly confirmed fracture and osteomyelitis.  Patient sent to the ED for IV antibiotics and  possible surgical intervention. -MRI done and showed " Interval amputation of the 5th ray through the base of the 5th metatarsal. 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 Probable early osteomyelitis involving the head of the 3rd metatarsal, the proximal 4th phalanx and distal half of the remaining 5th metatarsal. Soft tissue ulceration lateral to the 4th metatarsal with surrounding inflammation. No focal fluid collection. Atrophy and T2 hyperintensity throughout the forefoot musculature." -Patient started on Zosyn in the ED, podiatry was consulted and will see the patient inpatient and planning on going to TMA tomorrow -Continue to monitor on telemetry overnight -Appreciate podiatry recommendations and assistance -Continue with IV Zosyn and given his poor chance of healing we have consulted infectious diseases for further antibiotic management -ID consulted and recommending continuing IV Zosyn pending further surgical evaluation and they believe that the patient exhausted all his efforts at limb salvage progression with osteomyelitis suspected prolonged antibiotic therapy and attempt at limb salvage and 1 amputation November 2024.  They feel that he needs a more definitive surgery at this time and recommending having the patient consider a BKA but patient is undergoing a TMA tomorrow -Trend fever curve and WBC -Follow-up blood cultures   PAD -Known history of PAD.  Did have angiogram in August 2023 with runoff.   -This was notable for right pedal circulation provided only by posterior tibial artery.  This is relevant to healing in the setting of osteomyelitis above. -Had previously been on aspirin and do not see this on patient's medication list currently -Saw Dr. [name] of Vascular surgery and has examined been  worsened last September.  His angiography showed no options for reconstruction and he did have an inflow to the  ankle via the peroneal only runoff which had robust arborization in the posterior tibial artery.  Fortunately there is no improvement chances is healing from a vascular standpoint at that time.   Diabetes Mellitus Type 2 -Currently holding glipizide 5 mg p.o. daily before breakfast and Dapagliflozin 10 mg po Daily -Most recent HbA1c was 5.9 -Continue Sensitive Novolog SSI AC -Continue to Monitor CBG's per Protocol; CBG Trend: Recent Labs  Lab 08/24/22 1059 08/24/22 1645 08/24/22 2115 08/25/22 0747 08/25/22 1143  GLUCAP 156* 100* 98 139* 175*    Hypertension -Continue home Furosemide 40 mg po Daily and Metoprolol Succinate 12.5 mg po Daily -Continue to Monitor BP per Protocol -Last BP Reading was 99/56 -Adjust BP Meds as necessary   Hyperlipidemia -Do not currently see patient on any medications for this on MAR   Chronic Diastolic CHF  -Echo earlier this year with EF 50-55%, G2 DD, normal RV function. -Continue home Furosemide 40 mg po Daily and Metoprolol Succinate 12.5 mg po Daily; Currently holding Dapaglifozin 10 mg po Daily  -Strict I's and O's and Daily Weights.  Intake/Output Summary (Last 24 hours) at 08/25/2022 1417 Last data filed at 08/25/2022 0900 Gross per 24 hour  Intake 120 ml  Output 150 ml  Net -30 ml  -Continue to Monitor for S/Sx of Volume Overload   CAD -Chart review shows history of angioplasty. -Recently seen Dr. Antoine Poche with cardiology and he had an echocardiogram in February of this year which showed an EF of 50 to 55% -Continue Metoprolol Succinate 12.5 mg po Daily  CKD Stage 3a -BUN/Cr Trend: Recent Labs  Lab 08/19/22 1432 08/24/22 1105 08/25/22 0421  BUN 43* 35* 31*  CREATININE 1.48* 1.53* 1.52*  -Avoid Nephrotoxic Medications, Contrast Dyes, Hypotension and Dehydration to Ensure Adequate Renal Perfusion and will need to Renally Adjust Meds -Continue to Monitor and Trend Renal Function carefully and repeat CMP in the AM    Hypoalbuminemia -Patient's Albumin Trend: Recent Labs  Lab 08/19/22 1432 08/24/22 1105 08/25/22 0421  ALBUMIN 4.0 3.7 3.3*  -Continue to Monitor and Trend and repeat CMP in the AM  DVT prophylaxis: SCDs Start: 08/24/22 1253    Code Status: DNR Family Communication: No family at bedside  Disposition Plan:  Level of care: Telemetry Surgical Status is: Observation The patient will require care spanning > 2 midnights and should be moved to inpatient because: Will require surgical intervention   Consultants:  Podiatry Infectious diseases  Procedures:  None  Antimicrobials:  Anti-infectives (From admission, onward)    Start     Dose/Rate Route Frequency Ordered Stop   08/24/22 2200  piperacillin-tazobactam (ZOSYN) IVPB 3.375 g        3.375 g 12.5 mL/hr over 240 Minutes Intravenous Every 8 hours 08/24/22 1248     08/24/22 1300  piperacillin-tazobactam (ZOSYN) IVPB 3.375 g        3.375 g 100 mL/hr over 30 Minutes Intravenous  Once 08/24/22 1248 08/24/22 1403       Subjective: Seen and examined at bedside and he was fairly confused.  Was complaining about some right foot pain.  No nausea or vomiting.  Podiatry evaluated and planning on a TMA tomorrow.  Denies any chest pain or shortness of breath.  No other concerns or complaints at this time.  Objective: Vitals:   08/24/22 1415 08/24/22 2006 08/25/22 0500 08/25/22 0620  BP: Marland Kitchen)  158/68 (!) 165/71  (!) 178/75  Pulse: (!) 45 (!) 56  62  Resp: 15 17  16   Temp:  98.4 F (36.9 C)  (!) 97.4 F (36.3 C)  TempSrc:  Oral  Oral  SpO2: 97% 98%  96%  Weight:   76.7 kg   Height:        Intake/Output Summary (Last 24 hours) at 08/25/2022 5176 Last data filed at 08/25/2022 1607 Gross per 24 hour  Intake 0 ml  Output 150 ml  Net -150 ml   Filed Weights   08/24/22 1053 08/25/22 0500  Weight: 78.9 kg 76.7 kg   Examination: Physical Exam:  Constitutional: WN/WD chronically ill-appearing elderly Caucasian male in no acute  distress appears slightly uncomfortable Respiratory: Diminished to auscultation bilaterally, no wheezing, rales, rhonchi or crackles. Normal respiratory effort and patient is not tachypenic. No accessory muscle use.  Unlabored breathing Cardiovascular: RRR, no murmurs / rubs / gallops. S1 and S2 auscultated.  Abdomen: Soft, non-tender, non-distended. Bowel sounds positive.  GU: Deferred. Musculoskeletal: No clubbing / cyanosis of digits/nails. No joint deformity upper and lower extremities.  Skin: Significant right-sided foot changes noted with ulceration Neurologic: CN 2-12 grossly intact with no focal deficits. Romberg sign cerebellar reflexes not assessed.  Psychiatric: Impaired judgment and insight. Alert and oriented x 1 and to himself only   Data Reviewed: I have personally reviewed following labs and imaging studies  CBC: Recent Labs  Lab 08/19/22 1432 08/24/22 1105 08/25/22 0421  WBC 6.6 5.5 6.3  NEUTROABS 3.5 3.2  --   HGB 13.2 14.9 15.1  HCT 39.3 47.3 45.7  MCV 83 86.0 85.3  PLT 195 205 210   Basic Metabolic Panel: Recent Labs  Lab 08/19/22 1432 08/24/22 1105 08/25/22 0421  NA 139 135 138  K 4.5 4.4 4.6  CL 102 104 104  CO2 20 22 26   GLUCOSE 89 150* 115*  BUN 43* 35* 31*  CREATININE 1.48* 1.53* 1.52*  CALCIUM 9.3 9.1 9.1   GFR: Estimated Creatinine Clearance: 39.9 mL/min (A) (by C-G formula based on SCr of 1.52 mg/dL (H)). Liver Function Tests: Recent Labs  Lab 08/19/22 1432 08/24/22 1105 08/25/22 0421  AST 12 14* 13*  ALT 9 13 10   ALKPHOS 128* 99 91  BILITOT 0.8 0.8 1.1  PROT 6.9 8.0 7.1  ALBUMIN 4.0 3.7 3.3*   No results for input(s): "LIPASE", "AMYLASE" in the last 168 hours. No results for input(s): "AMMONIA" in the last 168 hours. Coagulation Profile: No results for input(s): "INR", "PROTIME" in the last 168 hours. Cardiac Enzymes: No results for input(s): "CKTOTAL", "CKMB", "CKMBINDEX", "TROPONINI" in the last 168 hours. BNP (last 3  results) No results for input(s): "PROBNP" in the last 8760 hours. HbA1C: No results for input(s): "HGBA1C" in the last 72 hours. CBG: Recent Labs  Lab 08/24/22 1059 08/24/22 1645 08/24/22 2115 08/25/22 0747  GLUCAP 156* 100* 98 139*   Lipid Profile: No results for input(s): "CHOL", "HDL", "LDLCALC", "TRIG", "CHOLHDL", "LDLDIRECT" in the last 72 hours. Thyroid Function Tests: No results for input(s): "TSH", "T4TOTAL", "FREET4", "T3FREE", "THYROIDAB" in the last 72 hours. Anemia Panel: No results for input(s): "VITAMINB12", "FOLATE", "FERRITIN", "TIBC", "IRON", "RETICCTPCT" in the last 72 hours. Sepsis Labs: Recent Labs  Lab 08/24/22 1105  LATICACIDVEN 1.1   Recent Results (from the past 240 hour(s))  Blood Cultures x 2 sites     Status: None (Preliminary result)   Collection Time: 08/24/22 10:56 AM   Specimen: BLOOD RIGHT FOREARM  Result Value Ref Range Status   Specimen Description BLOOD RIGHT FOREARM  Final   Special Requests   Final    BOTTLES DRAWN AEROBIC AND ANAEROBIC Blood Culture adequate volume   Culture   Final    NO GROWTH < 24 HOURS Performed at [facility], [address], [place], [place] [address]    Report Status PENDING  Incomplete  Blood Cultures x 2 sites     Status: None (Preliminary result)   Collection Time: 08/24/22  3:54 PM   Specimen: BLOOD  Result Value Ref Range Status   Specimen Description BLOOD RIGHT ANTECUBITAL  Final   Special Requests   Final    BOTTLES DRAWN AEROBIC AND ANAEROBIC Blood Culture adequate volume   Culture   Final    NO GROWTH < 24 HOURS Performed at [facility], [address] [place], [place] [address]    Report Status PENDING  Incomplete    Radiology Studies: MR FOOT RIGHT WO CONTRAST  Result Date: 08/25/2022 CLINICAL DATA:  Osteomyelitis of the foot. EXAM: MRI OF THE RIGHT FOREFOOT WITHOUT CONTRAST TECHNIQUE: Multiplanar, multisequence MR imaging of the right forefoot was performed. No intravenous  contrast was administered. COMPARISON:  Radiographs 06/17/2022 and 08/24/2022.  MRI 11/12/2021. FINDINGS: Bones/Joint/Cartilage Since the previous MRI, the patient has undergone amputation of the 5th ray through the base of the 5th metatarsal. As shown on the recent radiographs, there is new osteomyelitis involving the distal half of the 4th metatarsal with a pathologic fracture of the metatarsal neck. The 4th metatarsal head is displaced inferolaterally and appears exposed. In addition, there is mild T2 hyperintensity and mildly decreased T1 signal within the proximal 4th phalanx, the head of the 3rd metatarsal as well as the distal half of the remaining 5th metatarsal. No significant osseous findings within the 1st or 2nd rays. The alignment is normal at the Lisfranc joint. The visualized tarsal bones appear unremarkable. No significant joint effusions. Ligaments Intact Lisfranc ligament. The collateral ligaments of the 1st through 3rd metatarsophalangeal joints appear intact. 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 No forefoot tendon rupture or significant tenosynovitis demonstrated. Soft tissues Soft tissue ulceration lateral to the 4th metatarsal with surrounding inflammation. No focal fluid collection. IMPRESSION: 1. 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 2. 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 3. Probable early osteomyelitis involving the head of the 3rd metatarsal, the proximal 4th phalanx and distal half of the remaining 5th metatarsal. 4. Soft tissue ulceration lateral to the 4th metatarsal with surrounding inflammation. No focal fluid collection. 5. Atrophy and T2 hyperintensity throughout the forefoot musculature. Electronically Signed   By: Carey Bullocks M.D.   On: 08/25/2022 08:01    Scheduled Meds:   furosemide  40 mg Oral Daily   insulin aspart  0-9 Units Subcutaneous TID WC   metoprolol succinate  12.5 mg Oral Daily   sodium chloride flush  3 mL Intravenous Q12H   Continuous Infusions:  piperacillin-tazobactam (ZOSYN)  IV 3.375 g (08/25/22 0528)    LOS: 0 days   Marguerita Merles, DO [facility] Available via Epic secure chat 7am-7pm After these hours, please refer to coverage provider listed on [email or web address] 08/25/2022, 8:38 AM

## 2022-08-25 NOTE — H&P (View-Only) (Signed)
PODIATRY CONSULTATION  NAME Wayne [name] MRN [id number] DOB 16-Jan-1941 DOA 08/24/2022   Reason for consult: Osteomyelitis RT foot Chief Complaint  Patient presents with   Foot Pain    Consulting physician: Margarita Grizzle MD, [facility] ED  History of present illness: 82 y.o. male PMHx diabetes, neuropathy, PAD, CKD, CHF seen earlier this week in the podiatry clinic and referred to the emergency department for acute osteomyelitis of the right forefoot.  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  In the podiatry clinic it was noted that there was exposed fourth metatarsal.  Patient has been on 8-week course of Augmentin and ciprofloxacin managed/monitored by infectious disease.    Known history of peripheral vascular disease with tibial vessel disease with peroneal inline flow and collateral vessel development in the posterior tibial artery.  Last evaluated by vascular 12/22/2021.  Revascularization not an option from a vascular standpoint however the patient did have robust arborization of the posterior tibial artery  Upon admission MRI completed.  Patient resting comfortably today in bed with his significant other, [name], present  Past Medical History:  Diagnosis Date   Anemia of chronic disease    Chronic HFrEF (heart failure with reduced ejection fraction) (HCC)    Chronic kidney disease, stage 3a (HCC)    Coronary artery disease    Dr. Deborah Chalk years ago. No details.   Diabetes mellitus with circulatory complication (HCC)    Diabetic foot infection (HCC)    Encephalopathy    Former tobacco use    Lactic acidosis 11/28/2021   Multifocal pneumonia 11/28/2021   PAD (peripheral artery disease) (HCC)    Peripheral neuropathy    SDH (subdural hematoma) (HCC)    Severe pulmonary hypertension (HCC)        Latest Ref Rng & Units 08/25/2022    4:21 AM 08/24/2022   11:05 AM 08/19/2022    2:32 PM  CBC  WBC 4.0 - 10.5 K/uL 6.3  5.5  6.6   Hemoglobin 13.0  - 17.0 g/dL 40.9  81.1  91.4   Hematocrit 39.0 - 52.0 % 45.7  47.3  39.3   Platelets 150 - 400 K/uL 210  205  195        Latest Ref Rng & Units 08/25/2022    4:21 AM 08/24/2022   11:05 AM 08/19/2022    2:32 PM  BMP  Glucose 70 - 99 mg/dL 782  956  89   BUN 8 - 23 mg/dL 31  35  43   Creatinine 0.61 - 1.24 mg/dL 2.13  0.86  5.78   BUN/Creat Ratio 10 - 24   29   Sodium 135 - 145 mmol/L 138  135  139   Potassium 3.5 - 5.1 mmol/L 4.6  4.4  4.5   Chloride 98 - 111 mmol/L 104  104  102   CO2 22 - 32 mmol/L 26  22  20    Calcium 8.9 - 10.3 mg/dL 9.1  9.1  9.3     RT foot 08/24/2022   Physical Exam: General: The patient is alert and oriented x3 in no acute distress.   Dermatology: Open wound with eschar and exposed fourth metatarsal head noted lateral aspect of the right foot.  Vascular: Last seen by vascular 12/22/2021, Dr. Heath Lark.  No options for vascular reconstruction.  Inline flow to the ankle via the peroneal only runoff.  Robust arborization of the posterior tibial artery.  Neurological: Light touch and protective threshold diminished  bilaterally.   Musculoskeletal Exam: Prior amputation fifth ray right foot  MR FOOT RIGHT WO CONTRAST 08/24/2022 IMPRESSION: 1. 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 2. 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 3. Probable early osteomyelitis involving the head of the 3rd metatarsal, the proximal 4th phalanx and distal half of the remaining 5th metatarsal. 4. Soft tissue ulceration lateral to the 4th metatarsal with surrounding inflammation. No focal fluid collection. 5. Atrophy and T2 hyperintensity throughout the forefoot musculature.  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  This would allow the best potential for healing and prevent further complications postoperatively.  Amputation of the fourth ray and possible third metatarsal heads would only create further complications including transfer lesions and poor potential for healing.  After discussing in detail the risks and benefits involved with the surgery he is amenable to this plan and attempt to salvage the foot. -Preoperative orders placed.  Surgery will consist of midfoot amputation right foot.  Bone biopsy right foot.  Tendo Achilles lengthening right.  -N.p.o. after midnight.  Surgery tentatively scheduled for tomorrow, 08/26/2022, afternoon -Postoperative antibiotic medication management with infectious disease would be very beneficial.  Patient seen earlier today by infectious disease, Dr. Gwynn Burly, who is in agreement for more definitive surgical intervention.  Input and management always appreciated -Will continue to follow  Thank you for the consult.  Please contact me directly via secure chat with any questions or concerns.     Felecia Shelling, DPM [facility]  Dr. Felecia Shelling, DPM    [address] [place], [place] [address]                Office [phone]  Fax [phone]

## 2022-08-25 NOTE — Consult Note (Signed)
[facility]    Date of Admission:  08/24/2022     Reason for Consult: Osteomyelitis     Referring Physician: Dr Marland Mcalpine  Current antibiotics: Piperacillin/tazobactam  ASSESSMENT:    82 y.o. male admitted with:  Right foot osteomyelitis: Patient presenting with worsening osteomyelitis of his right foot with prior attempts at limb salvage with minor amputation and prolonged antibiotics. Peripheral artery disease: Patient has seen vascular surgery in the past and per their documentation has been optimized from a blood flow perspective. 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 Chronic kidney disease stage IIIa: Creatinine is at baseline with a creatinine clearance of approximately 40.  RECOMMENDATIONS:    Continue piperacillin/tazobactam pending further surgical evaluation At this time, I believe patient has likely exhausted his efforts at limb salvage with progression of osteomyelitis despite prolonged antibiotic therapy and attempt at limb salvage with minor amputation in February 2024.  He has significant PAD which is hindering blood flow to his distal extremity and this will likely prohibit wound from healing in the future. 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)   CAD S/P percutaneous coronary angioplasty   PAD (peripheral artery disease) (HCC)   Diabetic neuropathy (HCC)   Chronic diastolic CHF (congestive heart failure) (HCC)   Chronic kidney disease, stage 3a (HCC)   Hypertension associated with diabetes (HCC)   Hyperlipidemia associated with type 2 diabetes mellitus (HCC)   MEDICATIONS:    Scheduled Meds:  furosemide  40 mg Oral Daily   insulin aspart  0-9 Units Subcutaneous TID WC   metoprolol succinate  12.5 mg Oral Daily    sodium chloride flush  3 mL Intravenous Q12H   Continuous Infusions:  piperacillin-tazobactam (ZOSYN)  IV 3.375 g (08/25/22 0528)   PRN Meds:.acetaminophen **OR** acetaminophen, hydrALAZINE  HPI:    Wayne [name] is a 82 y.o. male with history of chronic right lower extremity wound and peripheral artery disease.  I know him well as an outpatient.  He was admitted at [facility] in February 2024 with deep infection of his right foot showing osteomyelitis.  He underwent partial fifth ray amputation where there was extensive necrosis of the plantar compartment.  He had further I&D on February 27 where bone cultures were finalized as negative and pathology suggested bony regeneration without acute osteomyelitis noted.  However, his surgical cultures from 2/25 were polymicrobial with Enterococcus faecalis, Pseudomonas aeruginosa, and Prevotella.  Given the minor amputation with hopeful source control, he was discharged on ciprofloxacin and Augmentin planned through 06/22/2022.  I saw him as an outpatient on that date.  Given the family's desire to exhaust efforts medically before pursuing more proximal amputation, we extended his antibiotics for an additional 4 weeks through 07/28/2022.  He was scheduled for follow-up with one of our partners, Dr. Thedore Mins, on 07/28/2022.  However, patient was unable to make that appointment and his antibiotics were continued at that time until further follow-up.  Patient was seen yesterday by podiatry (Dr. Ardelle Anton) for an acute appointment with his family.  He was noted to have exposure of the fourth metatarsal head from the site of the wound.  Given the acute worsening, he was recommended to present to the emergency department which he did yesterday afternoon.  He was admitted to the hospital  and started on piperacillin/tazobactam.  MRI was obtained showing new osteomyelitis involving the distal half of the fourth metatarsal with a pathologic fracture as well as probable early  osteomyelitis involving the head of the third metatarsal, the proximal fourth phalanx, and the distal half of the remaining fifth metatarsal.  There was no obvious abscess or fluid collection.  We have been consulted for further recommendations.  Past Medical History:  Diagnosis Date   Anemia of chronic disease    Chronic HFrEF (heart failure with reduced ejection fraction) (HCC)    Chronic kidney disease, stage 3a (HCC)    Coronary artery disease    Dr. Deborah Chalk years ago. No details.   Diabetes mellitus with circulatory complication (HCC)    Diabetic foot infection (HCC)    Encephalopathy    Former tobacco use    Lactic acidosis 11/28/2021   Multifocal pneumonia 11/28/2021   PAD (peripheral artery disease) (HCC)    Peripheral neuropathy    SDH (subdural hematoma) (HCC)    Severe pulmonary hypertension (HCC)     Social History   Tobacco Use   Smoking status: Former    Types: Cigarettes    Quit date: 1993    Years since quitting: 31.4   Smokeless tobacco: Never  Vaping Use   Vaping Use: Never used  Substance Use Topics   Alcohol use: No   Drug use: No    Family History  Problem Relation Age of Onset   Cervical cancer Mother    Down syndrome Sister    Diabetes type II Neg Hx     No Known Allergies  Review of Systems  Constitutional: Negative.   Musculoskeletal: Negative.   Skin:        Right foot wound with exposed bone  All other systems reviewed and are negative.   OBJECTIVE:   Blood pressure (!) 178/75, pulse 62, temperature (!) 97.4 F (36.3 C), temperature source Oral, resp. rate 16, height 6' (1.829 m), weight 76.7 kg, SpO2 96 %. Body mass index is 22.93 kg/m.  Physical Exam Constitutional:      General: He is not in acute distress.    Appearance: Normal appearance.  HENT:     Head: Normocephalic and atraumatic.  Eyes:     Extraocular Movements: Extraocular movements intact.     Conjunctiva/sclera: Conjunctivae normal.  Cardiovascular:      Rate and Rhythm: Normal rate and regular rhythm.  Pulmonary:     Effort: Pulmonary effort is normal. No respiratory distress.  Abdominal:     General: There is no distension.     Palpations: Abdomen is soft.  Musculoskeletal:     Cervical back: Normal range of motion and neck supple.     Comments: Chronic right foot ulceration which has progressed since the last time I saw him in March  Skin:    General: Skin is warm and dry.     Findings: No rash.  Neurological:     General: No focal deficit present.     Mental Status: He is alert. He is disoriented.     Cranial Nerves: No cranial nerve deficit.     Comments: He is alert and oriented to person, place.  He is not oriented to time  Psychiatric:        Mood and Affect: Mood normal.        Behavior: Behavior normal.      Lab Results: Lab Results  Component Value Date   WBC 6.3 08/25/2022   HGB  15.1 08/25/2022   HCT 45.7 08/25/2022   MCV 85.3 08/25/2022   PLT 210 08/25/2022    Lab Results  Component Value Date   NA 138 08/25/2022   K 4.6 08/25/2022   CO2 26 08/25/2022   GLUCOSE 115 (H) 08/25/2022   BUN 31 (H) 08/25/2022   CREATININE 1.52 (H) 08/25/2022   CALCIUM 9.1 08/25/2022   GFRNONAA 45 (L) 08/25/2022   GFRAA >90 06/13/2012    Lab Results  Component Value Date   ALT 10 08/25/2022   AST 13 (L) 08/25/2022   ALKPHOS 91 08/25/2022   BILITOT 1.1 08/25/2022       Component Value Date/Time   CRP 18.5 (H) 07/20/2022 0200       Component Value Date/Time   ESRSEDRATE 31 (H) 07/20/2022 0200    I have reviewed the micro and lab results in Epic.  Imaging: MR FOOT RIGHT WO CONTRAST  Result Date: 08/25/2022 CLINICAL DATA:  Osteomyelitis of the foot. EXAM: MRI OF THE RIGHT FOREFOOT WITHOUT CONTRAST TECHNIQUE: Multiplanar, multisequence MR imaging of the right forefoot was performed. No intravenous contrast was administered. COMPARISON:  Radiographs 06/17/2022 and 08/24/2022.  MRI 11/12/2021. FINDINGS:  Bones/Joint/Cartilage Since the previous MRI, the patient has undergone amputation of the 5th ray through the base of the 5th metatarsal. As shown on the recent radiographs, there is new osteomyelitis involving the distal half of the 4th metatarsal with a pathologic fracture of the metatarsal neck. The 4th metatarsal head is displaced inferolaterally and appears exposed. In addition, there is mild T2 hyperintensity and mildly decreased T1 signal within the proximal 4th phalanx, the head of the 3rd metatarsal as well as the distal half of the remaining 5th metatarsal. No significant osseous findings within the 1st or 2nd rays. The alignment is normal at the Lisfranc joint. The visualized tarsal bones appear unremarkable. No significant joint effusions. Ligaments Intact Lisfranc ligament. The collateral ligaments of the 1st through 3rd metatarsophalangeal joints appear intact. 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 No forefoot tendon rupture or significant tenosynovitis demonstrated. Soft tissues Soft tissue ulceration lateral to the 4th metatarsal with surrounding inflammation. No focal fluid collection. IMPRESSION: 1. 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 2. 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 3. Probable early osteomyelitis involving the head of the 3rd metatarsal, the proximal 4th phalanx and distal half of the remaining 5th metatarsal. 4. Soft tissue ulceration lateral to the 4th metatarsal with surrounding inflammation. No focal fluid collection. 5. Atrophy and T2 hyperintensity throughout the forefoot musculature. Electronically Signed   By: Carey Bullocks M.D.   On: 08/25/2022 08:01     Imaging independently reviewed in Epic.  Vedia Coffer for [facility]  Group [phone] pager 08/25/2022, 11:10 AM

## 2022-08-25 NOTE — Consult Note (Signed)
 PODIATRY CONSULTATION  NAME Wayne [name] MRN [id number] DOB 12/17/1940 DOA 08/24/2022   Reason for consult: Osteomyelitis RT foot Chief Complaint  Patient presents with   Foot Pain    Consulting physician: Danielle Ray MD, [facility] ED  History of present illness: 81 y.o. male PMHx diabetes, neuropathy, PAD, CKD, CHF seen earlier this week in the podiatry clinic and referred to the emergency department for acute osteomyelitis of the right forefoot.  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  In the podiatry clinic it was noted that there was exposed fourth metatarsal.  Patient has been on 8-week course of Augmentin and ciprofloxacin managed/monitored by infectious disease.    Known history of peripheral vascular disease with tibial vessel disease with peroneal inline flow and collateral vessel development in the posterior tibial artery.  Last evaluated by vascular 12/22/2021.  Revascularization not an option from a vascular standpoint however the patient did have robust arborization of the posterior tibial artery  Upon admission MRI completed.  Patient resting comfortably today in bed with his significant other, Caroline, present  Past Medical History:  Diagnosis Date   Anemia of chronic disease    Chronic HFrEF (heart failure with reduced ejection fraction) (HCC)    Chronic kidney disease, stage 3a (HCC)    Coronary artery disease    Dr. Tennant years ago. No details.   Diabetes mellitus with circulatory complication (HCC)    Diabetic foot infection (HCC)    Encephalopathy    Former tobacco use    Lactic acidosis 11/28/2021   Multifocal pneumonia 11/28/2021   PAD (peripheral artery disease) (HCC)    Peripheral neuropathy    SDH (subdural hematoma) (HCC)    Severe pulmonary hypertension (HCC)        Latest Ref Rng & Units 08/25/2022    4:21 AM 08/24/2022   11:05 AM 08/19/2022    2:32 PM  CBC  WBC 4.0 - 10.5 K/uL 6.3  5.5  6.6   Hemoglobin 13.0  - 17.0 g/dL 15.1  14.9  13.2   Hematocrit 39.0 - 52.0 % 45.7  47.3  39.3   Platelets 150 - 400 K/uL 210  205  195        Latest Ref Rng & Units 08/25/2022    4:21 AM 08/24/2022   11:05 AM 08/19/2022    2:32 PM  BMP  Glucose 70 - 99 mg/dL 115  150  89   BUN 8 - 23 mg/dL 31  35  43   Creatinine 0.61 - 1.24 mg/dL 1.52  1.53  1.48   BUN/Creat Ratio 10 - 24   29   Sodium 135 - 145 mmol/L 138  135  139   Potassium 3.5 - 5.1 mmol/L 4.6  4.4  4.5   Chloride 98 - 111 mmol/L 104  104  102   CO2 22 - 32 mmol/L 26  22  20   Calcium 8.9 - 10.3 mg/dL 9.1  9.1  9.3     RT foot 08/24/2022   Physical Exam: General: The patient is alert and oriented x3 in no acute distress.   Dermatology: Open wound with eschar and exposed fourth metatarsal head noted lateral aspect of the right foot.  Vascular: Last seen by vascular 12/22/2021, Dr. Thomas Hawken.  No options for vascular reconstruction.  Inline flow to the ankle via the peroneal only runoff.  Robust arborization of the posterior tibial artery.  Neurological: Light touch and protective threshold diminished   bilaterally.   Musculoskeletal Exam: Prior amputation fifth ray right foot  MR FOOT RIGHT WO CONTRAST 08/24/2022 IMPRESSION: 1. 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 2. 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 3. Probable early osteomyelitis involving the head of the 3rd metatarsal, the proximal 4th phalanx and distal half of the remaining 5th metatarsal. 4. Soft tissue ulceration lateral to the 4th metatarsal with surrounding inflammation. No focal fluid collection. 5. Atrophy and T2 hyperintensity throughout the forefoot musculature.  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  This would allow the best potential for healing and prevent further complications postoperatively.  Amputation of the fourth ray and possible third metatarsal heads would only create further complications including transfer lesions and poor potential for healing.  After discussing in detail the risks and benefits involved with the surgery he is amenable to this plan and attempt to salvage the foot. -Preoperative orders placed.  Surgery will consist of midfoot amputation right foot.  Bone biopsy right foot.  Tendo Achilles lengthening right.  -N.p.o. after midnight.  Surgery tentatively scheduled for tomorrow, 08/26/2022, afternoon -Postoperative antibiotic medication management with infectious disease would be very beneficial.  Patient seen earlier today by infectious disease, Dr. Andrew Wallace, who is in agreement for more definitive surgical intervention.  Input and management always appreciated -Will continue to follow  Thank you for the consult.  Please contact me directly via secure chat with any questions or concerns.     Milah Recht M. Emmajo Bennette, DPM [facility]  Dr. Jennessy Sandridge M. Ece Cumberland, DPM    [address]                                        [place], [place] [address]                Office [phone]  Fax [phone]      

## 2022-08-25 NOTE — Progress Notes (Signed)
Initial Nutrition Assessment  DOCUMENTATION CODES:   Severe malnutrition in context of chronic illness  INTERVENTION:   Nepro Shake po BID, each supplement provides 425 kcal and 19 grams protein  Pro-Source Plus 30ml PO BID- Each supplement provides 100kcal and 15g protein   MVI po daily   Vitamin C 500mg  po BID  Zinc 220mg  po daily x 14 days   Pt at high refeed risk; recommend monitor potassium, magnesium and phosphorus labs daily until stable  Daily weights  NUTRITION DIAGNOSIS:   Severe Malnutrition related to chronic illness as evidenced by severe fat depletion, severe muscle depletion.  GOAL:   Patient will meet greater than or equal to 90% of their needs  MONITOR:   PO intake, Supplement acceptance, Labs, Weight trends, Skin, I & O's  REASON FOR ASSESSMENT:   Consult Wound healing  ASSESSMENT:   82 y/o male with h/o DM, CAD, PVD, CHD, CKD III, HTN, HLD, SDH, pulmonary hypertension and recent R fifth metatarsal amputation (February) and who is now admitted with osteomyelitis.  Met with pt and pt's wife in room today. Pt is a poor historian and is HOH so majority of the history was obtained from pt's wife. Pt reports that he is feeling well today. Wife at bedside reports pt with poor appetite and oral intake at baseline but reports that his intake has been decreased since he has been dealing with this foot infection. Per chart, pt is down 10lbs(6%) since February. Wife reports that patient loves eggs and breakfast meats. Pt documented to have eaten ~25% of his breakfast this morning; pt ate only bites of some spaghetti for lunch. Pt did have a half drank Glucerna on his table. Pt reports that he does not really like the Glucerna because it tastes "stale". RD discussed with pt and wife the importance of adequate nutrition needed to preserve lean muscle and to support wound healing. Pt would like to try ProSource Plus and mixed berry Nepro. RD will add supplements and  vitamins to help pt meet her estimated needs. Pt is likely at refeed risk. Recommend continue supplements and vitamins until wound healing is complete.    Medications reviewed and include: lasix, insulin, zosyn  Labs reviewed: K 4.6 wnl, BUN 31(H), creat 1.52(H) Cbgs- 175, 139 x 24 hrs  AIC 5.9(H)  NUTRITION - FOCUSED PHYSICAL EXAM:  Flowsheet Row Most Recent Value  Orbital Region Moderate depletion  Upper Arm Region Severe depletion  Thoracic and Lumbar Region Severe depletion  Buccal Region Moderate depletion  Temple Region Mild depletion  Clavicle Bone Region Severe depletion  Clavicle and Acromion Bone Region Severe depletion  Scapular Bone Region Moderate depletion  Dorsal Hand Severe depletion  Patellar Region Severe depletion  Anterior Thigh Region Severe depletion  Posterior Calf Region Severe depletion  Edema (RD Assessment) None  Hair Reviewed  Eyes Reviewed  Mouth Reviewed  Skin Reviewed  Nails Reviewed   Diet Order:   Diet Order             Diet regular Fluid consistency: Thin  Diet effective now                  EDUCATION NEEDS:   Education needs have been addressed  Skin:  Skin Assessment: Reviewed RN Assessment (R foot osteomyelitis s/p metatarsal amputation)  Last BM:  5/27  Height:   Ht Readings from Last 1 Encounters:  08/24/22 6' (1.829 m)    Weight:   Wt Readings from Last 1 Encounters:  08/25/22 76.7 kg    Ideal Body Weight:  80.9 kg  BMI:  Body mass index is 22.93 kg/m.  Estimated Nutritional Needs:   Kcal:  2000-2300kcal/day  Protein:  100-115g/day  Fluid:  1.9-2.2L/day  Betsey Holiday MS, RD, LDN Please refer to [facility] for RD and/or RD on-call/weekend/after hours pager

## 2022-08-25 NOTE — TOC Initial Note (Signed)
Transition of Care [facility]) - Initial/Assessment Note    Patient Details  Name: Wayne [name] MRN: [id number] Date of Birth: 1941-02-16  Transition of Care [facility]) CM/SW Contact:    Lorri Frederick, LCSW Phone Number: 08/25/2022, 2:44 PM  Clinical Narrative:     Pt exwife Wayne [name] reqeusted CSW come to the room.  Pt has varying orientation level per epic, but was able to participate.   Permission given to speak with Wayne [name] and with daughter Wayne [name].  Wayne [name] reports that they anticipate the need for SNF after surgery tomorrow.  First choice would be [facility].  Was previously at [facility] and did not have good experience there.  Pt lives alone, no current services.  CSW discussed process of PT eval and SNF referral if appropriate.  TOC will continue to follow.              Expected Discharge Plan:  (TBD) Barriers to Discharge: Continued Medical Work up   Patient Goals and CMS Choice Patient states their goals for this hospitalization and ongoing recovery are:: get back to mowing my grass          Expected Discharge Plan and Services In-house Referral: Clinical Social Work   Post Acute Care Choice:  (TBD) Living arrangements for the past 2 months: Single Family Home                                      Prior Living Arrangements/Services Living arrangements for the past 2 months: Single Family Home Lives with:: Self Patient language and need for interpreter reviewed:: Yes Do you feel safe going back to the place where you live?: Yes      Need for Family Participation in Patient Care: Yes (Comment) Care giver support system in place?: Yes (comment) Current home services: Other (comment) (none) Criminal Activity/Legal Involvement Pertinent to Current Situation/Hospitalization: No - Comment as needed  Activities of Daily Living      Permission Sought/Granted Permission sought to share information with : Family Supports Permission granted to share  information with : Yes, Verbal Permission Granted  Share Information with NAME: ex wife Wayne [name], daughter Wayne [name]           Emotional Assessment Appearance:: Appears stated age Attitude/Demeanor/Rapport: Engaged Affect (typically observed): Pleasant Orientation: : Oriented to Self, Oriented to Situation      Admission diagnosis:  Osteomyelitis (HCC) [M86.9] Osteomyelitis of right foot, unspecified type (HCC) [M86.9] Patient Active Problem List   Diagnosis Date Noted   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   Malnutrition of moderate degree 05/26/2022   Pyogenic inflammation of bone (HCC) 05/21/2022   Acute osteomyelitis of right foot (HCC) 05/20/2022   Hypertension associated with diabetes (HCC) 02/16/2022   Hyperlipidemia associated with type 2 diabetes mellitus (HCC) 02/16/2022   Vitamin D deficiency 02/16/2022   Primary insomnia 02/16/2022   Closed compression fracture of body of L1 vertebra (HCC) 12/03/2021   Chronic diastolic CHF (congestive heart failure) (HCC) 12/02/2021   Chronic kidney disease, stage 3a (HCC) 12/02/2021   Ischemia of toe 12/01/2021   Constipation 11/30/2021   Fall at home 11/28/2021   Subdural hematoma (HCC) 11/28/2021   Failure to thrive in adult 11/28/2021   Vitamin B12 deficiency 11/28/2021   B12 deficiency anemia 11/28/2021   PAD (peripheral artery disease) (HCC) 11/28/2021   Diabetic neuropathy (HCC) 11/28/2021   Cellulitis of right foot 11/11/2021   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 [place] [place] [address] Phone: [phone] Fax: [phone]     Social Determinants of Health (SDOH) Social History: SDOH Screenings   Food Insecurity: No Food Insecurity (05/22/2022)   Housing: Low Risk  (05/22/2022)  Transportation Needs: No Transportation Needs (05/22/2022)  Utilities: Not At Risk (05/22/2022)  Depression (PHQ2-9): Low Risk  (08/19/2022)  Financial Resource Strain: Low Risk  (05/05/2022)  Physical Activity: Inactive (05/05/2022)  Social Connections: Socially Isolated (05/05/2022)  Stress: No Stress Concern Present (05/05/2022)  Tobacco Use: Medium Risk (08/24/2022)   SDOH Interventions:     Readmission Risk Interventions    11/12/2021    2:59 PM  Readmission Risk Prevention Plan  Post Dischage Appt Complete  Medication Screening Complete  Transportation Screening Complete

## 2022-08-26 ENCOUNTER — Inpatient Hospital Stay (HOSPITAL_COMMUNITY): Payer: Medicare Other | Admitting: Anesthesiology

## 2022-08-26 ENCOUNTER — Inpatient Hospital Stay (HOSPITAL_COMMUNITY): Payer: Medicare Other

## 2022-08-26 ENCOUNTER — Encounter (HOSPITAL_COMMUNITY): Payer: Self-pay | Admitting: Internal Medicine

## 2022-08-26 ENCOUNTER — Encounter (HOSPITAL_COMMUNITY)
Admission: EM | Disposition: A | Discharge status: Skilled Nursing Facility | Payer: Self-pay | Source: Ambulatory Visit | Attending: Internal Medicine

## 2022-08-26 ENCOUNTER — Other Ambulatory Visit: Payer: Self-pay

## 2022-08-26 ENCOUNTER — Ambulatory Visit: Payer: Medicare Other | Admitting: Family Medicine

## 2022-08-26 DIAGNOSIS — M216X1 Other acquired deformities of right foot: Secondary | ICD-10-CM

## 2022-08-26 DIAGNOSIS — I2510 Atherosclerotic heart disease of native coronary artery without angina pectoris: Secondary | ICD-10-CM | POA: Diagnosis not present

## 2022-08-26 DIAGNOSIS — Z7984 Long term (current) use of oral hypoglycemic drugs: Secondary | ICD-10-CM | POA: Diagnosis not present

## 2022-08-26 DIAGNOSIS — I509 Heart failure, unspecified: Secondary | ICD-10-CM

## 2022-08-26 DIAGNOSIS — E1151 Type 2 diabetes mellitus with diabetic peripheral angiopathy without gangrene: Secondary | ICD-10-CM | POA: Diagnosis not present

## 2022-08-26 DIAGNOSIS — E1122 Type 2 diabetes mellitus with diabetic chronic kidney disease: Secondary | ICD-10-CM | POA: Diagnosis not present

## 2022-08-26 DIAGNOSIS — Z87891 Personal history of nicotine dependence: Secondary | ICD-10-CM

## 2022-08-26 DIAGNOSIS — M869 Osteomyelitis, unspecified: Secondary | ICD-10-CM

## 2022-08-26 DIAGNOSIS — E1169 Type 2 diabetes mellitus with other specified complication: Secondary | ICD-10-CM | POA: Diagnosis not present

## 2022-08-26 DIAGNOSIS — M86671 Other chronic osteomyelitis, right ankle and foot: Secondary | ICD-10-CM

## 2022-08-26 DIAGNOSIS — M86171 Other acute osteomyelitis, right ankle and foot: Secondary | ICD-10-CM | POA: Diagnosis not present

## 2022-08-26 DIAGNOSIS — I110 Hypertensive heart disease with heart failure: Secondary | ICD-10-CM

## 2022-08-26 DIAGNOSIS — I739 Peripheral vascular disease, unspecified: Secondary | ICD-10-CM | POA: Diagnosis not present

## 2022-08-26 DIAGNOSIS — N1831 Chronic kidney disease, stage 3a: Secondary | ICD-10-CM | POA: Diagnosis not present

## 2022-08-26 HISTORY — PX: AMPUTATION: SHX166

## 2022-08-26 HISTORY — PX: APPLICATION OF WOUND VAC: SHX5189

## 2022-08-26 LAB — CBC WITH DIFFERENTIAL/PLATELET
Abs Immature Granulocytes: 0.03 10*3/uL (ref 0.00–0.07)
Basophils Absolute: 0.1 10*3/uL (ref 0.0–0.1)
Basophils Relative: 1 %
Eosinophils Absolute: 0.3 10*3/uL (ref 0.0–0.5)
Eosinophils Relative: 4 %
HCT: 42.7 % (ref 39.0–52.0)
Hemoglobin: 14.3 g/dL (ref 13.0–17.0)
Immature Granulocytes: 0 %
Lymphocytes Relative: 29 %
Lymphs Abs: 2.1 10*3/uL (ref 0.7–4.0)
MCH: 27.6 pg (ref 26.0–34.0)
MCHC: 33.5 g/dL (ref 30.0–36.0)
MCV: 82.3 fL (ref 80.0–100.0)
Monocytes Absolute: 0.7 10*3/uL (ref 0.1–1.0)
Monocytes Relative: 9 %
Neutro Abs: 4.1 10*3/uL (ref 1.7–7.7)
Neutrophils Relative %: 57 %
Platelets: 209 10*3/uL (ref 150–400)
RBC: 5.19 MIL/uL (ref 4.22–5.81)
RDW: 13.8 % (ref 11.5–15.5)
WBC: 7.3 10*3/uL (ref 4.0–10.5)
nRBC: 0 % (ref 0.0–0.2)

## 2022-08-26 LAB — COMPREHENSIVE METABOLIC PANEL
ALT: 12 U/L (ref 0–44)
AST: 13 U/L — ABNORMAL LOW (ref 15–41)
Albumin: 3.4 g/dL — ABNORMAL LOW (ref 3.5–5.0)
Alkaline Phosphatase: 81 U/L (ref 38–126)
Anion gap: 12 (ref 5–15)
BUN: 35 mg/dL — ABNORMAL HIGH (ref 8–23)
CO2: 24 mmol/L (ref 22–32)
Calcium: 8.9 mg/dL (ref 8.9–10.3)
Chloride: 104 mmol/L (ref 98–111)
Creatinine, Ser: 1.83 mg/dL — ABNORMAL HIGH (ref 0.61–1.24)
GFR, Estimated: 36 mL/min — ABNORMAL LOW (ref >60)
Glucose, Bld: 116 mg/dL — ABNORMAL HIGH (ref 70–99)
Potassium: 3.8 mmol/L (ref 3.5–5.1)
Sodium: 140 mmol/L (ref 135–145)
Total Bilirubin: 1.2 mg/dL (ref 0.3–1.2)
Total Protein: 7.1 g/dL (ref 6.5–8.1)

## 2022-08-26 LAB — MAGNESIUM: Magnesium: 2.7 mg/dL — ABNORMAL HIGH (ref 1.7–2.4)

## 2022-08-26 LAB — GLUCOSE, CAPILLARY
Glucose-Capillary: 126 mg/dL — ABNORMAL HIGH (ref 70–99)
Glucose-Capillary: 165 mg/dL — ABNORMAL HIGH (ref 70–99)
Glucose-Capillary: 85 mg/dL (ref 70–99)
Glucose-Capillary: 96 mg/dL (ref 70–99)

## 2022-08-26 LAB — PHOSPHORUS: Phosphorus: 4.2 mg/dL (ref 2.5–4.6)

## 2022-08-26 SURGERY — AMPUTATION, FOOT, PARTIAL
Anesthesia: Monitor Anesthesia Care | Site: Foot | Laterality: Right

## 2022-08-26 MED ORDER — BUPIVACAINE HCL (PF) 0.5 % IJ SOLN
INTRAMUSCULAR | Status: AC
  Filled 2022-08-26: qty 30

## 2022-08-26 MED ORDER — LIDOCAINE HCL 2 % IJ SOLN
INTRAMUSCULAR | Status: AC
  Filled 2022-08-26: qty 20

## 2022-08-26 MED ORDER — DEXAMETHASONE SODIUM PHOSPHATE 10 MG/ML IJ SOLN
INTRAMUSCULAR | Status: AC
  Filled 2022-08-26: qty 1

## 2022-08-26 MED ORDER — PROPOFOL 10 MG/ML IV BOLUS
INTRAVENOUS | Status: DC | PRN
  Administered 2022-08-26 (×2): 20 mg via INTRAVENOUS

## 2022-08-26 MED ORDER — PHENYLEPHRINE HCL-NACL 20-0.9 MG/250ML-% IV SOLN
INTRAVENOUS | Status: DC | PRN
  Administered 2022-08-26: 50 ug/min via INTRAVENOUS

## 2022-08-26 MED ORDER — ONDANSETRON HCL 4 MG/2ML IJ SOLN: 4.0000 mg | Freq: Once | INTRAMUSCULAR | Status: DC | PRN

## 2022-08-26 MED ORDER — ACETAMINOPHEN 10 MG/ML IV SOLN: 1000.0000 mg | Freq: Once | INTRAVENOUS | Status: DC | PRN

## 2022-08-26 MED ORDER — PROPOFOL 1000 MG/100ML IV EMUL
INTRAVENOUS | Status: AC
  Filled 2022-08-26: qty 100

## 2022-08-26 MED ORDER — LACTATED RINGERS IV SOLN: INTRAVENOUS | Status: DC

## 2022-08-26 MED ORDER — LIDOCAINE HCL 2 % IJ SOLN
INTRAMUSCULAR | Status: DC | PRN
  Administered 2022-08-26: 10 mL

## 2022-08-26 MED ORDER — EPHEDRINE 5 MG/ML INJ
INTRAVENOUS | Status: AC
  Filled 2022-08-26: qty 10

## 2022-08-26 MED ORDER — FENTANYL CITRATE (PF) 100 MCG/2ML IJ SOLN
INTRAMUSCULAR | Status: AC
  Filled 2022-08-26: qty 2

## 2022-08-26 MED ORDER — FLUCONAZOLE 150 MG PO TABS
150.0000 mg | ORAL_TABLET | ORAL | Status: DC
  Filled 2022-08-26: qty 1

## 2022-08-26 MED ORDER — CHLORHEXIDINE GLUCONATE 0.12 % MT SOLN
15.0000 mL | Freq: Once | OROMUCOSAL | Status: AC
  Administered 2022-08-26: 15 mL via OROMUCOSAL
  Filled 2022-08-26: qty 15

## 2022-08-26 MED ORDER — ORAL CARE MOUTH RINSE: 15.0000 mL | Freq: Once | OROMUCOSAL | Status: AC

## 2022-08-26 MED ORDER — EPHEDRINE SULFATE (PRESSORS) 50 MG/ML IJ SOLN
INTRAMUSCULAR | Status: DC | PRN
  Administered 2022-08-26: 10 mg via INTRAVENOUS
  Administered 2022-08-26 (×2): 5 mg via INTRAVENOUS

## 2022-08-26 MED ORDER — FENTANYL CITRATE (PF) 100 MCG/2ML IJ SOLN: 25.0000 ug | INTRAMUSCULAR | Status: DC | PRN

## 2022-08-26 MED ORDER — BUPIVACAINE HCL (PF) 0.5 % IJ SOLN
INTRAMUSCULAR | Status: DC | PRN
  Administered 2022-08-26: 10 mL

## 2022-08-26 MED ORDER — INSULIN ASPART 100 UNIT/ML IJ SOLN: 0.0000 [IU] | INTRAMUSCULAR | Status: DC | PRN

## 2022-08-26 MED ORDER — FENTANYL CITRATE (PF) 250 MCG/5ML IJ SOLN
INTRAMUSCULAR | Status: DC | PRN
  Administered 2022-08-26 (×2): 25 ug via INTRAVENOUS

## 2022-08-26 MED ORDER — 0.9 % SODIUM CHLORIDE (POUR BTL) OPTIME
TOPICAL | Status: DC | PRN
  Administered 2022-08-26: 1000 mL

## 2022-08-26 MED ORDER — AMISULPRIDE (ANTIEMETIC) 5 MG/2ML IV SOLN: 10.0000 mg | Freq: Once | INTRAVENOUS | Status: DC | PRN

## 2022-08-26 MED ORDER — MIDAZOLAM HCL 2 MG/2ML IJ SOLN
INTRAMUSCULAR | Status: AC
  Filled 2022-08-26: qty 2

## 2022-08-26 MED ORDER — PHENYLEPHRINE 80 MCG/ML (10ML) SYRINGE FOR IV PUSH (FOR BLOOD PRESSURE SUPPORT)
PREFILLED_SYRINGE | INTRAVENOUS | Status: AC
  Filled 2022-08-26: qty 10

## 2022-08-26 MED ORDER — METOPROLOL SUCCINATE 12.5 MG HALF TABLET
12.5000 mg | ORAL_TABLET | Freq: Once | ORAL | Status: AC
  Administered 2022-08-26: 12.5 mg via ORAL
  Filled 2022-08-26: qty 1

## 2022-08-26 MED ORDER — PROPOFOL 500 MG/50ML IV EMUL
INTRAVENOUS | Status: DC | PRN
  Administered 2022-08-26: 50 ug/kg/min via INTRAVENOUS

## 2022-08-26 MED ORDER — FENTANYL CITRATE (PF) 250 MCG/5ML IJ SOLN
INTRAMUSCULAR | Status: AC
  Filled 2022-08-26: qty 5

## 2022-08-26 SURGICAL SUPPLY — 58 items
ALLOGRAFT SALERA SHEET 3X8 (Graft) IMPLANT
APL PRP STRL LF DISP 70% ISPRP (MISCELLANEOUS) ×1
APL SKNCLS STERI-STRIP NONHPOA (GAUZE/BANDAGES/DRESSINGS)
BAG COUNTER SPONGE SURGICOUNT (BAG) ×1 IMPLANT
BAG SPNG CNTER NS LX DISP (BAG) ×1
BENZOIN TINCTURE PRP APPL 2/3 (GAUZE/BANDAGES/DRESSINGS) ×1 IMPLANT
BLADE AVERAGE 25X9 (BLADE) IMPLANT
BLADE OSCILLATING /SAGITTAL (BLADE) ×1 IMPLANT
BLADE SURG 11 STRL SS (BLADE) IMPLANT
BLADE SURG 15 STRL LF DISP TIS (BLADE) IMPLANT
BLADE SURG 15 STRL SS (BLADE)
BNDG CMPR 5X4 KNIT ELC UNQ LF (GAUZE/BANDAGES/DRESSINGS) ×1
BNDG ELASTIC 4INX 5YD STR LF (GAUZE/BANDAGES/DRESSINGS) IMPLANT
BNDG ELASTIC 4X5.8 VLCR STR LF (GAUZE/BANDAGES/DRESSINGS) IMPLANT
BNDG GAUZE DERMACEA FLUFF 4 (GAUZE/BANDAGES/DRESSINGS) ×1 IMPLANT
BNDG GZE DERMACEA 4 6PLY (GAUZE/BANDAGES/DRESSINGS) ×1
CANISTER PREVENA PLUS 150 (CANNISTER) IMPLANT
CHLORAPREP W/TINT 26 (MISCELLANEOUS) IMPLANT
CNTNR URN SCR LID CUP LEK RST (MISCELLANEOUS) IMPLANT
CONT SPEC 4OZ STRL OR WHT (MISCELLANEOUS) ×2
COVER SURGICAL LIGHT HANDLE (MISCELLANEOUS) ×1 IMPLANT
CUFF TOURN SGL QUICK 18X4 (TOURNIQUET CUFF) ×1 IMPLANT
CUFF TOURN SGL QUICK 24 (TOURNIQUET CUFF) ×1
CUFF TRNQT CYL 24X4X16.5-23 (TOURNIQUET CUFF) IMPLANT
DRESSING PEEL AND PLAC PRVNA20 (GAUZE/BANDAGES/DRESSINGS) IMPLANT
DRESSING PEEL AND PLC PRVNA 13 (GAUZE/BANDAGES/DRESSINGS) IMPLANT
DRSG PEEL AND PLACE PREVENA 13 (GAUZE/BANDAGES/DRESSINGS)
DRSG PEEL AND PLACE PREVENA 20 (GAUZE/BANDAGES/DRESSINGS) ×1
ELECT REM PT RETURN 9FT ADLT (ELECTROSURGICAL) ×1
ELECTRODE REM PT RTRN 9FT ADLT (ELECTROSURGICAL) IMPLANT
GAUZE PACKING IODOFORM 1/4X15 (PACKING) ×1 IMPLANT
GAUZE PAD ABD 8X10 STRL (GAUZE/BANDAGES/DRESSINGS) ×1 IMPLANT
GAUZE SPONGE 4X4 12PLY STRL (GAUZE/BANDAGES/DRESSINGS) ×1 IMPLANT
GAUZE XEROFORM 1X8 LF (GAUZE/BANDAGES/DRESSINGS) ×1 IMPLANT
GLOVE BIO SURGEON STRL SZ8 (GLOVE) ×1 IMPLANT
GLOVE BIOGEL PI IND STRL 8 (GLOVE) ×1 IMPLANT
GOWN STRL REUS W/ TWL LRG LVL3 (GOWN DISPOSABLE) ×2 IMPLANT
GOWN STRL REUS W/TWL LRG LVL3 (GOWN DISPOSABLE) ×2
KIT BASIN OR (CUSTOM PROCEDURE TRAY) ×1 IMPLANT
KIT TURNOVER KIT B (KITS) ×1 IMPLANT
NDL PRECISIONGLIDE 27X1.5 (NEEDLE) ×1 IMPLANT
NEEDLE PRECISIONGLIDE 27X1.5 (NEEDLE) ×1 IMPLANT
NS IRRIG 1000ML POUR BTL (IV SOLUTION) ×1 IMPLANT
PACK ORTHO EXTREMITY (CUSTOM PROCEDURE TRAY) ×1 IMPLANT
PAD ARMBOARD 7.5X6 YLW CONV (MISCELLANEOUS) ×2 IMPLANT
PAD CAST 4YDX4 CTTN HI CHSV (CAST SUPPLIES) IMPLANT
PADDING CAST COTTON 4X4 STRL (CAST SUPPLIES)
SOL PREP POV-IOD 4OZ 10% (MISCELLANEOUS) ×1 IMPLANT
SPONGE T-LAP 18X18 ~~LOC~~+RFID (SPONGE) IMPLANT
STAPLER VISISTAT 35W (STAPLE) IMPLANT
STRIP CLOSURE SKIN 1/2X4 (GAUZE/BANDAGES/DRESSINGS) ×1 IMPLANT
SUT PROLENE 4 0 PS 2 18 (SUTURE) IMPLANT
SUT VIC AB 3-0 PS2 18 (SUTURE) IMPLANT
SUT VICRYL 4-0 PS2 18IN ABS (SUTURE) ×1 IMPLANT
SYR CONTROL 10ML LL (SYRINGE) ×2 IMPLANT
TOWEL GREEN STERILE (TOWEL DISPOSABLE) ×1 IMPLANT
TUBE CONNECTING 12X1/4 (SUCTIONS) ×1 IMPLANT
YANKAUER SUCT BULB TIP NO VENT (SUCTIONS) IMPLANT

## 2022-08-26 NOTE — Progress Notes (Addendum)
PROGRESS NOTE    Wayne [name]  ZOX:[id number] DOB: 12-06-1940 DOA: 08/24/2022 PCP: Sonny Masters, FNP   Brief Narrative:  he patient is an 82 year old Caucasian male with a past medical history significant for but limited to diabetes mellitus type 2, neuropathy, PAD, hypertension, hyperlipidemia, CKD stage IIIa, subdural hematoma, chronic diastolic CHF, CAD as well as other comorbidities who presented with right foot osteomyelitis and pain.  He has been having ongoing right foot pain and he had a previous right fifth metatarsal amputation and has been following with wound care, infectious disease and podiatry in outpatient setting.  He been on 8-week course of Augmentin and ciprofloxacin and follow-up with his podiatrist yesterday and there is concern for osteomyelitis which was confirmed on the x-ray in the office and there is also new fracture noted.  He was sent to the ED for IV antibiotics and admission for surgical intervention.  In the ED he was noted to have elevated blood pressures and heart rate in the 50s.  Lab workup was revealing BUN/creatinine of 35/1.35.  Blood cultures obtained and right foot x-rays were done in the podiatry office were not currently visible or reported.  Podiatry recommended MRI and antibiotics and he was started on IV Zosyn.  ID has been consulted and they are recommending continue IV Zosyn pending further surgical intervention and plan is for TMA today of his right foot with a bone biopsy however he did eat breakfast this morning and not sure if this will delay his surgery or push it off till tomorrow.     Assessment and Plan: No notes have been filed under this hospital service. Service: Hospitalist   Osteomyelitis of right fourth metatarsal -History of prior foot infection status amputation of right fifth metatarsal. -Has had ongoing foot pain and was at podiatry's office today for debridement/follow-up.  Concern for right fourth metatarsal changes. -X-ray  in the office reportedly confirmed fracture and osteomyelitis.  Patient sent to the ED for IV antibiotics and possible surgical intervention. -MRI done and showed " Interval amputation of the 5th ray through the base of the 5th metatarsal. 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 Probable early osteomyelitis involving the head of the 3rd metatarsal, the proximal 4th phalanx and distal half of the remaining 5th metatarsal. Soft tissue ulceration lateral to the 4th metatarsal with surrounding inflammation. No focal fluid collection. Atrophy and T2 hyperintensity throughout the forefoot musculature." -Patient started on Zosyn in the ED, podiatry was consulted and will see the patient inpatient and planning on going to TMA today however patient ate breakfast this morning so he may delay surgery this afternoon or evening pressure up to tomorrow but will defer to the Podiatrist -Continue to monitor on telemetry overnight -Appreciate podiatry recommendations and assistance -Continue with IV Zosyn and given his poor chance of healing we have consulted infectious diseases for further antibiotic management -ID consulted and recommending continuing IV Zosyn pending further surgical evaluation and they believe that the patient exhausted all his efforts at limb salvage progression with osteomyelitis suspected prolonged antibiotic therapy and attempt at limb salvage and 1 amputation November 2024.  They feel that he needs a more definitive surgery at this time and recommending having the patient consider a BKA but patient is undergoing a TMA for further attempted foot salvage and prevention of BKA -Trend fever curve and WBC -Follow-up blood cultures and intraoperative cultures and ID feels that the duration of  postoperative antibiotics will be dependent on his surgical margins and whether if it is felt that all osteomyelitic bone has been removed  but they are anticipating at least 4 weeks of postoperative antibiotic therapy   PAD -Known history of PAD.  Did have angiogram in August 2023 with runoff.   -This was notable for right pedal circulation provided only by posterior tibial artery.  This is relevant to healing in the setting of osteomyelitis above. -Had previously been on aspirin and do not see this on patient's medication list currently -Saw Dr. [name] of Vascular surgery and has examined been worsened last September.  His angiography showed no options for reconstruction and he did have an inflow to the ankle via the peroneal only runoff which had robust arborization in the posterior tibial artery.  Fortunately there is no improvement chances is healing from a vascular standpoint at that time.   Diabetes Mellitus Type 2 -Currently holding glipizide 5 mg p.o. daily before breakfast and Dapagliflozin 10 mg po Daily -Most recent HbA1c was 5.9 -Continue Sensitive Novolog SSI AC -Continue to Monitor CBG's per Protocol; CBG Trend: Recent Labs  Lab 08/24/22 1645 08/24/22 2115 08/25/22 0747 08/25/22 1143 08/25/22 1625 08/26/22 0741 08/26/22 1126  GLUCAP 100* 98 139* 175* 142* 126* 165*    Hypertension -Continue home Furosemide 40 mg po Daily and Metoprolol Succinate 12.5 mg po Daily -Continue to Monitor BP per Protocol -Last BP Reading was 158/63 -Adjust BP Meds as necessary   Hyperlipidemia -Do not currently see patient on any medications for this on MAR   Chronic Diastolic CHF  -Echo earlier this year with EF 50-55%, G2 DD, normal RV function. -Continue home Furosemide 40 mg po Daily and Metoprolol Succinate 12.5 mg po Daily; Currently holding Dapaglifozin 10 mg po Daily  -Strict I's and O's and Daily Weights.  Intake/Output Summary (Last 24 hours) at 08/26/2022 1142 Last data filed at 08/26/2022 1026 Gross per 24 hour  Intake 520 ml  Output 700 ml  Net -180 ml   -Continue to Monitor for S/Sx of Volume Overload    CAD -Chart review shows history of angioplasty. -Recently seen Dr. Antoine Poche with cardiology and he had an echocardiogram in February of this year which showed an EF of 50 to 55% -Continue Metoprolol Succinate 12.5 mg po Daily   AKI on CKD Stage 3a -BUN/Cr Trend: Recent Labs  Lab 08/19/22 1432 08/24/22 1105 08/25/22 0421 08/26/22 0226  BUN 43* 35* 31* 35*  CREATININE 1.48* 1.53* 1.52* 1.83*  -Avoid Nephrotoxic Medications, Contrast Dyes, Hypotension and Dehydration to Ensure Adequate Renal Perfusion and will need to Renally Adjust Meds -His Lasix given his slight bump in creatinine. -Continue to Monitor and Trend Renal Function carefully and repeat CMP in the AM    Hypoalbuminemia -Patient's Albumin Trend: Recent Labs  Lab 08/19/22 1432 08/24/22 1105 08/25/22 0421 08/26/22 0226  ALBUMIN 4.0 3.7 3.3* 3.4*  -Continue to Monitor and Trend and repeat CMP in the AM  Severe malnutrition in the context of chronic illness Nutrition Status: Nutrition Problem: Severe Malnutrition Etiology: chronic illness Signs/Symptoms: severe fat depletion, severe muscle depletion   -Nutrition recently been consulted and recommending Nepro shakes p.o. twice daily as well as Prosource +30 mL p.o. twice daily as well as a multivitamin p.o. daily, vitamin C 500 mg p.o. twice daily, zinc to 20 mg p.o. daily for 14 days  -The dietitian feels the patient is a high refeeding risk and recommending monitoring potassium magnesium and phosphorus labs daily  until stable and also when the patient daily    DVT prophylaxis: SCDs Start: 08/24/22 1253    Code Status: DNR Family Communication: No family currently at bedside  Disposition Plan:  Level of care: Telemetry Surgical Status is: Inpatient Remains inpatient appropriate because: Needs further surgical intervention and will be going for TMA likely but unclear if he will be done later this afternoon tomorrow given that patient ate breakfast    Consultants:  Podiatry Infectious Diseases  Procedures:  As delineated as above  Antimicrobials:  Anti-infectives (From admission, onward)    Start     Dose/Rate Route Frequency Ordered Stop   08/24/22 2200  piperacillin-tazobactam (ZOSYN) IVPB 3.375 g        3.375 g 12.5 mL/hr over 240 Minutes Intravenous Every 8 hours 08/24/22 1248     08/24/22 1300  piperacillin-tazobactam (ZOSYN) IVPB 3.375 g        3.375 g 100 mL/hr over 30 Minutes Intravenous  Once 08/24/22 1248 08/24/22 1403       Subjective: Normal examined at bedside and he had no complaints.  Felt okay.  Denies any nausea or vomiting.  States that he is in no pain currently.  No other concerns or complaints at this time but unfortunately ate some breakfast still unsure if his surgery will be delayed added today or even week postoperative tomorrow.  Objective: Vitals:   08/25/22 1438 08/25/22 1904 08/26/22 0316 08/26/22 0745  BP: (!) 118/59 (!) 102/54 136/68 (!) 158/63  Pulse:  65 66 61  Resp: 20  18 18   Temp:   98 F (36.7 C) 97.7 F (36.5 C)  TempSrc:   Oral Oral  SpO2: 97%  98% 95%  Weight:      Height:        Intake/Output Summary (Last 24 hours) at 08/26/2022 1149 Last data filed at 08/26/2022 1026 Gross per 24 hour  Intake 520 ml  Output 700 ml  Net -180 ml   Filed Weights   08/24/22 1053 08/25/22 0500  Weight: 78.9 kg 76.7 kg   Examination: Physical Exam:  Constitutional: WN/WD chronically ill-appearing elderly Caucasian male in no acute distress appears calm Respiratory: Diminished to auscultation bilaterally, no wheezing, rales, rhonchi or crackles. Normal respiratory effort and patient is not tachypenic. No accessory muscle use.  Unlabored breathing Cardiovascular: RRR, no murmurs / rubs / gallops. S1 and S2 auscultated. No extremity edema. Abdomen: Soft, non-tender, non-distended. Bowel sounds positive.  GU: Deferred. Musculoskeletal: No clubbing / cyanosis of digits/nails. No joint  deformity upper and lower extremities.  Skin: Significant right-sided foot changes noted with ulceration and this is wrapped Neurologic: CN 2-12 grossly intact with no focal deficits. Romberg sign and cerebellar reflexes not assessed.  Psychiatric: Pleasantly confused but continues to have impaired judgment and insight.  He is awake and alert but not fully oriented.  Data Reviewed: I have personally reviewed following labs and imaging studies  CBC: Recent Labs  Lab 08/19/22 1432 08/24/22 1105 08/25/22 0421 08/26/22 0226  WBC 6.6 5.5 6.3 7.3  NEUTROABS 3.5 3.2  --  4.1  HGB 13.2 14.9 15.1 14.3  HCT 39.3 47.3 45.7 42.7  MCV 83 86.0 85.3 82.3  PLT 195 205 210 209   Basic Metabolic Panel: Recent Labs  Lab 08/19/22 1432 08/24/22 1105 08/25/22 0421 08/26/22 0226  NA 139 135 138 140  K 4.5 4.4 4.6 3.8  CL 102 104 104 104  CO2 20 22 26 24   GLUCOSE 89 150* 115* 116*  BUN 43* 35* 31* 35*  CREATININE 1.48* 1.53* 1.52* 1.83*  CALCIUM 9.3 9.1 9.1 8.9  MG  --   --   --  2.7*  PHOS  --   --   --  4.2   GFR: Estimated Creatinine Clearance: 33.2 mL/min (A) (by C-G formula based on SCr of 1.83 mg/dL (H)). Liver Function Tests: Recent Labs  Lab 08/19/22 1432 08/24/22 1105 08/25/22 0421 08/26/22 0226  AST 12 14* 13* 13*  ALT 9 13 10 12   ALKPHOS 128* 99 91 81  BILITOT 0.8 0.8 1.1 1.2  PROT 6.9 8.0 7.1 7.1  ALBUMIN 4.0 3.7 3.3* 3.4*   No results for input(s): "LIPASE", "AMYLASE" in the last 168 hours. No results for input(s): "AMMONIA" in the last 168 hours. Coagulation Profile: No results for input(s): "INR", "PROTIME" in the last 168 hours. Cardiac Enzymes: No results for input(s): "CKTOTAL", "CKMB", "CKMBINDEX", "TROPONINI" in the last 168 hours. BNP (last 3 results) No results for input(s): "PROBNP" in the last 8760 hours. HbA1C: No results for input(s): "HGBA1C" in the last 72 hours. CBG: Recent Labs  Lab 08/25/22 0747 08/25/22 1143 08/25/22 1625 08/26/22 0741  08/26/22 1126  GLUCAP 139* 175* 142* 126* 165*   Lipid Profile: No results for input(s): "CHOL", "HDL", "LDLCALC", "TRIG", "CHOLHDL", "LDLDIRECT" in the last 72 hours. Thyroid Function Tests: No results for input(s): "TSH", "T4TOTAL", "FREET4", "T3FREE", "THYROIDAB" in the last 72 hours. Anemia Panel: No results for input(s): "VITAMINB12", "FOLATE", "FERRITIN", "TIBC", "IRON", "RETICCTPCT" in the last 72 hours. Sepsis Labs: Recent Labs  Lab 08/24/22 1105  LATICACIDVEN 1.1   Recent Results (from the past 240 hour(s))  Blood Cultures x 2 sites     Status: None (Preliminary result)   Collection Time: 08/24/22 10:56 AM   Specimen: BLOOD RIGHT FOREARM  Result Value Ref Range Status   Specimen Description BLOOD RIGHT FOREARM  Final   Special Requests   Final    BOTTLES DRAWN AEROBIC AND ANAEROBIC Blood Culture adequate volume   Culture   Final    NO GROWTH 2 DAYS Performed at [facility], [address], [place], [place] [address]    Report Status PENDING  Incomplete  Blood Cultures x 2 sites     Status: None (Preliminary result)   Collection Time: 08/24/22  3:54 PM   Specimen: BLOOD  Result Value Ref Range Status   Specimen Description BLOOD RIGHT ANTECUBITAL  Final   Special Requests   Final    BOTTLES DRAWN AEROBIC AND ANAEROBIC Blood Culture adequate volume   Culture   Final    NO GROWTH 2 DAYS Performed at [facility], [address], [place], [place] [address]    Report Status PENDING  Incomplete    Radiology Studies: MR FOOT RIGHT WO CONTRAST  Result Date: 08/25/2022 CLINICAL DATA:  Osteomyelitis of the foot. EXAM: MRI OF THE RIGHT FOREFOOT WITHOUT CONTRAST TECHNIQUE: Multiplanar, multisequence MR imaging of the right forefoot was performed. No intravenous contrast was administered. COMPARISON:  Radiographs 06/17/2022 and 08/24/2022.  MRI 11/12/2021. FINDINGS: Bones/Joint/Cartilage Since the previous MRI, the patient has undergone amputation of the 5th ray  through the base of the 5th metatarsal. As shown on the recent radiographs, there is new osteomyelitis involving the distal half of the 4th metatarsal with a pathologic fracture of the metatarsal neck. The 4th metatarsal head is displaced inferolaterally and appears exposed. In addition, there is mild T2 hyperintensity and mildly decreased T1 signal within the proximal  4th phalanx, the head of the 3rd metatarsal as well as the distal half of the remaining 5th metatarsal. No significant osseous findings within the 1st or 2nd rays. The alignment is normal at the Lisfranc joint. The visualized tarsal bones appear unremarkable. No significant joint effusions. Ligaments Intact Lisfranc ligament. The collateral ligaments of the 1st through 3rd metatarsophalangeal joints appear intact. 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 No forefoot tendon rupture or significant tenosynovitis demonstrated. Soft tissues Soft tissue ulceration lateral to the 4th metatarsal with surrounding inflammation. No focal fluid collection. IMPRESSION: 1. 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 2. 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 3. Probable early osteomyelitis involving the head of the 3rd metatarsal, the proximal 4th phalanx and distal half of the remaining 5th metatarsal. 4. Soft tissue ulceration lateral to the 4th metatarsal with surrounding inflammation. No focal fluid collection. 5. Atrophy and T2 hyperintensity throughout the forefoot musculature. Electronically Signed   By: Carey Bullocks M.D.   On: 08/25/2022 08:01    Scheduled Meds:  (feeding supplement) PROSource Plus  30 mL Oral BID BM   ascorbic acid  500 mg Oral BID   feeding supplement (NEPRO CARB STEADY)  237 mL Oral BID BM   furosemide  40 mg Oral Daily   insulin aspart   0-9 Units Subcutaneous TID WC   metoprolol succinate  12.5 mg Oral Daily   multivitamin with minerals  1 tablet Oral Daily   nystatin cream   Topical BID   sodium chloride flush  3 mL Intravenous Q12H   zinc sulfate  220 mg Oral Daily   Continuous Infusions:  piperacillin-tazobactam (ZOSYN)  IV 3.375 g (08/26/22 0406)    LOS: 1 day   Marguerita Merles, DO [facility] Available via Epic secure chat 7am-7pm After these hours, please refer to coverage provider listed on [email or web address] 08/26/2022, 11:49 AM

## 2022-08-26 NOTE — Anesthesia Preprocedure Evaluation (Addendum)
Anesthesia Evaluation  Patient identified by MRN, date of birth, ID band Patient awake    Reviewed: Allergy & Precautions, NPO status , Patient's Chart, lab work & pertinent test results  Airway Mallampati: II  TM Distance: >3 FB Neck ROM: Full    Dental  (+) Edentulous Upper, Missing   Pulmonary former smoker   Pulmonary exam normal        Cardiovascular hypertension, Pt. on home beta blockers pulmonary hypertension+ CAD, + Cardiac Stents, + Peripheral Vascular Disease and +CHF  Normal cardiovascular exam  ECHO: 1. Left ventricular ejection fraction, by estimation, is 50 to 55% . The left ventricle has low normal function. Left ventricular endocardial border not optimally defined to evaluate regional wall motion, lateral regional wall motion abnormality persists. Left ventricular diastolic parameters are consistent with Grade II diastolic dysfunction ( pseudonormalization) . 2. Right ventricular systolic function is normal. The right ventricular size is normal. Tricuspid regurgitation signal is inadequate for assessing PA pressure. 3. Left atrial size was mildly dilated. 4. The mitral valve is grossly normal. Trivial mitral valve regurgitation. No evidence of mitral stenosis. 5. The aortic valve is grossly normal. There is mild calcification of the aortic valve. Aortic valve regurgitation is not visualized. No aortic stenosis is present. 6. The inferior vena cava is normal in size with greater than 50% respiratory variability, suggesting right atrial pressure of 3 mmHg.   Neuro/Psych Peripheral neuropathy   Neuromuscular disease  negative psych ROS   GI/Hepatic negative GI ROS, Neg liver ROS,,,  Endo/Other  diabetes, Oral Hypoglycemic Agents    Renal/GU Renal InsufficiencyRenal disease     Musculoskeletal negative musculoskeletal ROS (+)    Abdominal   Peds  Hematology negative hematology ROS (+)   Anesthesia Other  Findings osteomyelitis right foot  Reproductive/Obstetrics                             Anesthesia Physical Anesthesia Plan  ASA: 3  Anesthesia Plan: MAC   Post-op Pain Management:    Induction: Intravenous  PONV Risk Score and Plan: 1 and Ondansetron, Dexamethasone, Propofol infusion and Treatment may vary due to age or medical condition  Airway Management Planned: Simple Face Mask  Additional Equipment:   Intra-op Plan:   Post-operative Plan:   Informed Consent: I have reviewed the patients History and Physical, chart, labs and discussed the procedure including the risks, benefits and alternatives for the proposed anesthesia with the patient or authorized representative who has indicated his/her understanding and acceptance.   Patient has DNR.  Discussed DNR with patient and Suspend DNR.   Dental advisory given  Plan Discussed with: CRNA  Anesthesia Plan Comments:        Anesthesia Quick Evaluation

## 2022-08-26 NOTE — Anesthesia Postprocedure Evaluation (Signed)
Anesthesia Post Note  Patient: Wayne [name]  Procedure(s) Performed: MIDFOOT AMPUTATION RIGHT FOOT, BONE BIOPSY RIGHT FOOT, TENDO ACHILIES LENGTHENING (Right: Foot) APPLICATION OF WOUND VAC (Right: Foot)     Patient location during evaluation: PACU Anesthesia Type: [name] Level of consciousness: awake and alert Pain management: pain level controlled Vital Signs Assessment: post-procedure vital signs reviewed and stable Respiratory status: spontaneous breathing, nonlabored ventilation, respiratory function stable and patient connected to nasal cannula oxygen Cardiovascular status: stable and blood pressure returned to baseline Postop Assessment: no apparent nausea or vomiting Anesthetic complications: no   No notable events documented.  Last Vitals:  Vitals:   08/26/22 2015 08/26/22 2053  BP: 131/63 (!) 99/57  Pulse: 66 65  Resp: 16   Temp: 36.8 C 36.7 C  SpO2: 93% 98%    Last Pain:  Vitals:   08/26/22 2053  TempSrc: Oral  PainSc: Asleep                 Nelle Don Aly Seidenberg

## 2022-08-26 NOTE — Interval H&P Note (Signed)
History and Physical Interval Note:  08/26/2022 5:06 PM  Wayne [name]  has presented today for surgery, with the diagnosis of osteomyelitis right foot.  The various methods of treatment have been discussed with the patient and family. After consideration of risks, benefits and other options for treatment, the patient has consented to  Procedure(s): MIDFOOT AMPUTATION RIGHT FOOT (Right) as a surgical intervention.  The patient's history has been reviewed, patient examined, no change in status, stable for surgery.  I have reviewed the patient's chart and labs.  Questions were answered to the patient's satisfaction.     Felecia Shelling

## 2022-08-26 NOTE — Progress Notes (Signed)
Report called to #[id number]  Consent signed, in rider chart. Denies any pain. Dentures removed and left at bedside.

## 2022-08-26 NOTE — Brief Op Note (Signed)
08/26/2022  7:31 PM  PATIENT:  Wayne Wayne [name]  82 y.o. male  PRE-OPERATIVE DIAGNOSIS:  osteomyelitis right foot  POST-OPERATIVE DIAGNOSIS:  osteomyelitis right foot  PROCEDURE:  Procedure(s): MIDFOOT AMPUTATION RIGHT FOOT, BONE BIOPSY RIGHT FOOT, TENDO ACHILIES LENGTHENING (Right) APPLICATION OF WOUND VAC (Right)  SURGEON:  Surgeon(s) and Role:    Felecia Shelling, DPM - Primary  PHYSICIAN ASSISTANT:   ASSISTANTS: none   ANESTHESIA:   local and Wayne [name]  EBL:  50 mL   BLOOD ADMINISTERED:none  DRAINS: none   LOCAL MEDICATIONS USED:  MARCAINE   , LIDOCAINE , and Amount: 20 ml  SPECIMEN:  Source of Specimen:  bone biopsy 4th metatarsal proximal metaphysis sent for BOTH culture and pathology  DISPOSITION OF SPECIMEN:  PATHOLOGY  COUNTS:  YES  TOURNIQUET:   Total Tourniquet Time Documented: Thigh (Right) - 46 minutes Total: Thigh (Right) - 46 minutes   DICTATION: .Wayne Wayne [name] Dictation  PLAN OF CARE: Admit to inpatient   PATIENT DISPOSITION:  PACU - hemodynamically stable.   Delay start of Pharmacological VTE agent (>24hrs) due to surgical blood loss or risk of bleeding: no  Felecia Shelling, DPM [facility]  Dr. Felecia Shelling, DPM    [address] [place], [place] [address]                Office [phone]  Fax [phone]

## 2022-08-26 NOTE — Transfer of Care (Signed)
Immediate Anesthesia Transfer of Care Note  Patient: Wayne [name]  Procedure(s) Performed: MIDFOOT AMPUTATION RIGHT FOOT, BONE BIOPSY RIGHT FOOT, TENDO ACHILIES LENGTHENING (Right: Foot) APPLICATION OF WOUND VAC (Right: Foot)  Patient Location: PACU  Anesthesia Type:[name]  Level of Consciousness: awake and drowsy  Airway & Oxygen Therapy: Patient Spontanous Breathing and Patient connected to face mask oxygen  Post-op Assessment: Report given to RN and Post -op Vital signs reviewed and stable  Post vital signs: Reviewed and stable  Last Vitals:  Vitals Value Taken Time  BP 141/67 08/26/22 1915  Temp    Pulse 69 08/26/22 1919  Resp 17 08/26/22 1919  SpO2 94 % 08/26/22 1919  Vitals shown include unvalidated device data.  Last Pain:  Vitals:   08/26/22 1629  TempSrc: Oral  PainSc: 0-No pain         Complications: No notable events documented.

## 2022-08-26 NOTE — Progress Notes (Signed)
[facility]  Date of Admission:  08/24/2022           Reason for visit: Follow up on osteomyelitis  Current antibiotics: Piperacillin/tazobactam  ASSESSMENT:    82 y.o. male admitted with:  Right foot osteomyelitis: Patient presenting with worsening osteomyelitis of his right foot with prior attempts at limb salvage with minor amputation and prolonged antibiotics.  Seen by Dr. Logan Bores from podiatry yesterday and current recommendation is for right transmetatarsal amputation for further attempted foot salvage and prevention of BKA. Peripheral artery disease: Patient has seen vascular surgery in the past and per their documentation has been optimized from a blood flow perspective. 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 Chronic kidney disease stage IIIa: Creatinine is slightly above baseline this morning with a creatinine clearance 33.2.  RECOMMENDATIONS:    Continue piperacillin/tazobactam pending surgical source control Appreciate podiatry evaluation Duration of postoperative antibiotics will be dependent upon surgical margins and whether it is felt that all osteomyelitic bone has been removed.  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)   CAD S/P percutaneous coronary angioplasty   PAD (peripheral artery disease) (HCC)   Diabetic neuropathy (HCC)   Chronic diastolic CHF (congestive heart failure) (HCC)   Chronic kidney disease, stage 3a (HCC)   Hypertension associated with diabetes (HCC)   Hyperlipidemia associated with type 2 diabetes mellitus (HCC)   Protein-calorie malnutrition, severe    MEDICATIONS:    Scheduled Meds:  (feeding supplement) PROSource Plus  30 mL Oral BID BM   ascorbic acid  500 mg  Oral BID   feeding supplement (NEPRO CARB STEADY)  237 mL Oral BID BM   furosemide  40 mg Oral Daily   insulin aspart  0-9 Units Subcutaneous TID WC   metoprolol succinate  12.5 mg Oral Daily   multivitamin with minerals  1 tablet Oral Daily   nystatin cream   Topical BID   sodium chloride flush  3 mL Intravenous Q12H   zinc sulfate  220 mg Oral Daily   Continuous Infusions:  piperacillin-tazobactam (ZOSYN)  IV 3.375 g (08/26/22 0406)   PRN Meds:.acetaminophen **OR** acetaminophen, hydrALAZINE  SUBJECTIVE:   24 hour events:  No acute events Seen by Dr. Logan Bores Afebrile mild, T max 98 F Blood cultures x 2 no growth   Patient seen this morning.  He seems somewhat confused.  He is trying to answer his phone but nobody is on the other line.  Review of Systems  All other systems reviewed and are negative.     OBJECTIVE:   Blood pressure (!) 158/63, pulse 61, temperature 97.7 F (36.5 C), temperature source Oral, resp. rate 18, height 6' (1.829 m), weight 76.7 kg, SpO2 95 %. Body mass index is 22.93 kg/m.  Physical Exam Constitutional:      General: He is not in acute distress. HENT:     Head: Normocephalic and atraumatic.  Eyes:     Extraocular Movements: Extraocular movements intact.     Conjunctiva/sclera: Conjunctivae normal.  Pulmonary:     Effort: Pulmonary effort is normal. No respiratory distress.  Skin:    General: Skin is warm and dry.  Neurological:     General: No focal deficit present.     Mental  Status: He is alert. He is disoriented.  Psychiatric:        Mood and Affect: Mood normal.        Behavior: Behavior normal.      Lab Results: Lab Results  Component Value Date   WBC 7.3 08/26/2022   HGB 14.3 08/26/2022   HCT 42.7 08/26/2022   MCV 82.3 08/26/2022   PLT 209 08/26/2022    Lab Results  Component Value Date   NA 140 08/26/2022   K 3.8 08/26/2022   CO2 24 08/26/2022   GLUCOSE 116 (H) 08/26/2022   BUN 35 (H) 08/26/2022   CREATININE  1.83 (H) 08/26/2022   CALCIUM 8.9 08/26/2022   GFRNONAA 36 (L) 08/26/2022   GFRAA >90 06/13/2012    Lab Results  Component Value Date   ALT 12 08/26/2022   AST 13 (L) 08/26/2022   ALKPHOS 81 08/26/2022   BILITOT 1.2 08/26/2022       Component Value Date/Time   CRP 18.5 (H) 07/20/2022 0200       Component Value Date/Time   ESRSEDRATE 31 (H) 07/20/2022 0200     I have reviewed the micro and lab results in Epic.  Imaging: MR FOOT RIGHT WO CONTRAST  Result Date: 08/25/2022 CLINICAL DATA:  Osteomyelitis of the foot. EXAM: MRI OF THE RIGHT FOREFOOT WITHOUT CONTRAST TECHNIQUE: Multiplanar, multisequence MR imaging of the right forefoot was performed. No intravenous contrast was administered. COMPARISON:  Radiographs 06/17/2022 and 08/24/2022.  MRI 11/12/2021. FINDINGS: Bones/Joint/Cartilage Since the previous MRI, the patient has undergone amputation of the 5th ray through the base of the 5th metatarsal. As shown on the recent radiographs, there is new osteomyelitis involving the distal half of the 4th metatarsal with a pathologic fracture of the metatarsal neck. The 4th metatarsal head is displaced inferolaterally and appears exposed. In addition, there is mild T2 hyperintensity and mildly decreased T1 signal within the proximal 4th phalanx, the head of the 3rd metatarsal as well as the distal half of the remaining 5th metatarsal. No significant osseous findings within the 1st or 2nd rays. The alignment is normal at the Lisfranc joint. The visualized tarsal bones appear unremarkable. No significant joint effusions. Ligaments Intact Lisfranc ligament. The collateral ligaments of the 1st through 3rd metatarsophalangeal joints appear intact. 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 No forefoot tendon rupture or significant tenosynovitis demonstrated. Soft tissues Soft tissue ulceration lateral to the 4th metatarsal  with surrounding inflammation. No focal fluid collection. IMPRESSION: 1. 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 2. 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 3. Probable early osteomyelitis involving the head of the 3rd metatarsal, the proximal 4th phalanx and distal half of the remaining 5th metatarsal. 4. Soft tissue ulceration lateral to the 4th metatarsal with surrounding inflammation. No focal fluid collection. 5. Atrophy and T2 hyperintensity throughout the forefoot musculature. Electronically Signed   By: Carey Bullocks M.D.   On: 08/25/2022 08:01     Imaging independently reviewed in Epic.    Vedia Coffer for Infectious [facility] [phone] pager 08/26/2022, 10:59 AM

## 2022-08-26 NOTE — Op Note (Signed)
OPERATIVE REPORT Patient name: Wayne [name] MRN: [id number] DOB: 1941-02-08  DOS: 08/26/22  Preop Dx: Osteomyelitis right foot.  Equinus right foot Postop Dx: same  Procedure:  1.  Tendo Achilles lengthening right 2.  Transmetatarsal amputation right 3.  Bone biopsy fourth metatarsal right  Surgeon: Felecia Shelling DPM  Anesthesia: 50-50 mixture of 2% lidocaine plain with 0.5% Marcaine plain totaling 20 mL infiltrated in the patient's right lower extremity ankle block fashion in combination with monitored anesthesia care  Hemostasis: Calf tourniquet inflated to a pressure of without esmarch exsanguination   EBL: 50 mL Materials: [employer] Salera Membrane 3cm x 8cm human allograft tissue Injectables: None Pathology: Fourth metatarsal sent for both culture and pathology  Condition: The patient tolerated the procedure and anesthesia well. No complications noted or reported   Justification for procedure: The patient is a 82 y.o. male who presents today for surgical correction of osteomyelitis to the right foot. All conservative modalities of been unsuccessful in providing any sort of satisfactory alleviation of symptoms with the patient. The patient was told benefits as well as possible side effects of the surgery. The patient consented for surgical correction. The patient consent form was reviewed. All patient questions were answered. No guarantees were expressed or implied. The patient and the surgeon both signed the patient consent form with the witness present and placed in the patient's chart.   Procedure in Detail: The patient was brought to the operating room, placed in the operating table in the supine position at which time an aseptic scrub and drape were performed about the patient's respective lower extremity after anesthesia was induced as described above. Attention was then directed to the surgical area where procedure number one commenced.  Procedure #1:  Tendoachilles lengthening right 3 small percutaneous stab incisions were planned and made bisecting the Achilles tendon spaced approximately 1.5 cm apart.  Both the distal and proximal incision began bisecting the Achilles tendon and were extended medially releasing the medial fibers while the central incision was extended laterally releasing the lateral fibers.  Essentially creating an accordion type lengthening of the Achilles tendon.  Increased dorsiflexion of the ankle was then noted.  Primary closure was achieved of the percutaneous incisions using 4-0 Prolene suture.  Procedure #2: Transmetatarsal amputation right foot A fishmouth type incision was planned and made overlying the metatarsals 1 through 5 of the right foot.  The incision was carried directly down to the level of bone using a #10 scalpel.  Initially there was no tourniquet utilized however after the incision there was adequate perfusion of the foot and active bleeding so the decision was made to inflate the tourniquet to a pressure of 250 mg mercury without Esmarch exsanguination.  The soft tissue around the dorsal and plantar aspect of the foot was reflected in preparation for the ensuing osteotomy.  Osteotomy was then created using a sagittal blade of the metatarsals 1 through 5.  This was verified by intraoperative x-ray fluoroscopy to ensure a smooth anatomical contour and parabola which was satisfactory.  Any exposed extensor or flexor tendons were distracted distally and cut as far proximal as could be visualized.  Any vessel lumens were either bovied or tied using 4-0 Vicryl suture.    Procedure #3: Bone biopsy fourth metatarsal right foot  Attention was directed to the fourth metatarsal of the right foot where an additional osteotomy was created to harvest the most distal portion of remaining bone of the fourth metatarsal around the proximal  metaphysis.  Two separate specimens were harvested and sent for BOTH culture and pathology.   Irrigation was then utilized in preparation for primary closure.  Prior to closure [employer] Salera Membrane 3cm x 8cm was placed across the amputation stump to promote and increase the potential for healing across the amputation.  Primary closure was then achieved using stainless steel skin staples.  Prevena incisional wound VAC was then applied across the amputation stump incision site again to promote healing and reduce the chance of dehiscence.  Dry sterile compressive dressings were then applied.  The tourniquet which was used for hemostasis was deflated.   The patient was then transferred from the operating room to the recovery room having tolerated the procedure and anesthesia well. All vital signs are stable. After a brief stay in the recovery room the patient was readmitted to inpatient room with postoperative orders placed.    IMPRESSION: Adequate perfusion of the foot.  Intraoperatively there appeared to be adequate perfusion and bleeding of the soft tissue of the foot.  Soft tissue appears healthy and viable as well as the remaining proximal portions of the metatarsals.  Good potential for healing.  Based on MRI findings, I do not suspect clean osseous margins postoperatively. Patient will need continued antibiotic monitoring and management from infectious disease.  Felecia Shelling, DPM [facility]  Dr. Felecia Shelling, DPM    [address] [place], [place] [address]                Office [phone]  Fax [phone]

## 2022-08-26 NOTE — Progress Notes (Signed)
NPO orders taken now. Given by Hospitalist team.

## 2022-08-26 NOTE — Hospital Course (Addendum)
he patient is an 82 year old Caucasian male with a past medical history significant for but limited to diabetes mellitus type 2, neuropathy, PAD, hypertension, hyperlipidemia, CKD stage IIIa, subdural hematoma, chronic diastolic CHF, CAD as well as other comorbidities who presented with right foot osteomyelitis and pain.  He has been having ongoing right foot pain and he had a previous right fifth metatarsal amputation and has been following with wound care, infectious disease and podiatry in outpatient setting.  He been on 8-week course of Augmentin and ciprofloxacin and follow-up with his podiatrist yesterday and there is concern for osteomyelitis which was confirmed on the x-ray in the office and there is also new fracture noted.  He was sent to the ED for IV antibiotics and admission for surgical intervention.  In the ED he was noted to have elevated blood pressures and heart rate in the 50s.  Lab workup was revealing BUN/creatinine of 35/1.35.  Blood cultures obtained and right foot x-rays were done in the podiatry office were not currently visible or reported.  Podiatry recommended MRI and antibiotics and he was started on IV Zosyn.  ID has been consulted and they are recommending continue IV Zosyn pending further surgical intervention and plan is for TMA today of his right foot with a bone biopsy however he did eat breakfast this morning and not sure if this will delay his surgery or push it off till tomorrow.     Assessment and Plan: No notes have been filed under this hospital service. Service: Hospitalist   Osteomyelitis of right fourth metatarsal status post a tendo Achilles lengthening on the right, transmetatarsal amputation on the right and a bone biopsy of the fourth metatarsal in the right POD1 -History of prior foot infection status amputation of right fifth metatarsal. -Has had ongoing foot pain and was at podiatry's office today for debridement/follow-up.  Concern for right fourth  metatarsal changes. -X-ray in the office reportedly confirmed fracture and osteomyelitis.  Patient sent to the ED for IV antibiotics and possible surgical intervention. -MRI done and showed " Interval amputation of the 5th ray through the base of the 5th metatarsal. 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 Probable early osteomyelitis involving the head of the 3rd metatarsal, the proximal 4th phalanx and distal half of the remaining 5th metatarsal. Soft tissue ulceration lateral to the 4th metatarsal with surrounding inflammation. No focal fluid collection. Atrophy and T2 hyperintensity throughout the forefoot musculature." -Patient started on Zosyn in the ED, podiatry was consulted and patient was taken to the OR on 08/26/2022 and had a bone biopsy of the fourth metatarsal as well as a transmetatarsal amputation on the right and tendo Achilles lengthening; per Dr. Logan Bores he does not suspect clean osseous margins postoperatively and will need to continue antibiotic monitoring -Continue to monitor on telemetry overnight -Appreciate podiatry recommendations and assistance -Continue with IV Zosyn and given his poor chance of healing we have consulted infectious diseases for further antibiotic management -ID consulted and recommending continuing IV Zosyn pending further surgical evaluation and they believe that the patient exhausted all his efforts at limb salvage progression with osteomyelitis suspected prolonged antibiotic therapy and attempt at limb salvage and 1 amputation November 2024.  They feel that he needs a more definitive surgery at this time and recommending having the patient consider a BKA but patient is undergoing a TMA for further attempted foot salvage and prevention of BKA; unfortunately patient did not  have clean margins but ID is now recommending continue antibiotics for 6 weeks and recommending IV Zosyn and following  of the cultures and pathology.  They recommend IV antibiotics and they feel he has failed oral antibiotics.  Given his renal function they are requesting a central Cathter for Prolonged IV Access so have asked IR to Place to try and preserve arm veins in case of future hemodialysis/fistula/graft placement requirement -Trend fever curve and WBC -Follow-up blood cultures and intraoperative cultures and ID feels that the duration of postoperative antibiotics will be dependent on his surgical margins and whether if it is felt that all osteomyelitic bone has been removed but they are anticipating at least 4 weeks of postoperative antibiotic therapy   PAD -Known history of PAD.  Did have angiogram in August 2023 with runoff.   -This was notable for right pedal circulation provided only by posterior tibial artery.  This is relevant to healing in the setting of osteomyelitis above. -Had previously been on aspirin and do not see this on patient's medication list currently -Saw Dr. [name] of Vascular surgery and has examined been worsened last September.  His angiography showed no options for reconstruction and he did have an inflow to the ankle via the peroneal only runoff which had robust arborization in the posterior tibial artery.  Fortunately there is no improvement chances is healing from a vascular standpoint at that time.   Diabetes Mellitus Type 2 -Currently holding glipizide 5 mg p.o. daily before breakfast and Dapagliflozin 10 mg po Daily -Most recent HbA1c was 5.9 -Continue Sensitive Novolog SSI AC -Continue to Monitor CBG's per Protocol; CBG Trend: Recent Labs  Lab 08/26/22 0741 08/26/22 1126 08/26/22 1553 08/26/22 1915 08/26/22 2359 08/27/22 0727 08/27/22 1115  GLUCAP 126* 165* 85 96 206* 156* 153*    Hypertension -> Hypotension and Orthostatic VS -Continued home Furosemide 40 mg po Daily and Metoprolol Succinate 12.5 mg po Daily we will now discontinue furosemide and discontinue the  metoprolol -Continue to Monitor BP per Protocol -Last BP Reading was 26/55 but dropped down to 78/41 when he became very orthostatic -Adjust BP Meds as necessary   Hyperlipidemia -Do not currently see patient on any medications for this on MAR   Chronic Diastolic CHF  -Echo earlier this year with EF 50-55%, G2 DD, normal RV function. -Continue home Furosemide 40 mg po Daily and Metoprolol Succinate 12.5 mg po Daily; Currently holding Dapaglifozin 10 mg po Daily  -Strict I's and O's and Daily Weights.  Intake/Output Summary (Last 24 hours) at 08/27/2022 1612 Last data filed at 08/27/2022 1035 Gross per 24 hour  Intake 780 ml  Output 600 ml  Net 180 ml  -Will start Gentle IVF Hydration given Orthostasis -Continue to Monitor for S/Sx of Volume Overload  Orthostatic Hypotension -BP dropped and patient had a near syncopal episode -Hold Lasix -Start gentle IVF Hydration; Order TED Hose (on the Left) and Abdominal Binder -Repeat Orthostatic VS in the AM -Continue PT/OT to Evaluate and Treat -Now BP is 126/55   CAD -Chart review shows history of angioplasty. -Recently seen Dr. Antoine Poche with cardiology and he had an echocardiogram in February of this year which showed an EF of 50 to 55% -Continue Metoprolol Succinate 12.5 mg po Daily but holding Lasix now   AKI on CKD Stage 3a -BUN/Cr Trend: Recent Labs  Lab 08/19/22 1432 08/24/22 1105 08/25/22 0421 08/26/22 0226 08/27/22 1052  BUN 43* 35* 31* 35* 32*  CREATININE 1.48* 1.53* 1.52* 1.83* 1.80*  -  Avoid Nephrotoxic Medications, Contrast Dyes, Hypotension and Dehydration to Ensure Adequate Renal Perfusion and will need to Renally Adjust Meds -Was given IV Lasix and now changed back to oral Lasix but will hold due to Orthostatic Hypotension -Continue to Monitor and Trend Renal Function carefully and repeat CMP in the AM    Hypoalbuminemia -Patient's Albumin Trend: Recent Labs  Lab 08/19/22 1432 08/24/22 1105 08/25/22 0421  08/26/22 0226 08/27/22 1052  ALBUMIN 4.0 3.7 3.3* 3.4* 3.3*  -Continue to Monitor and Trend and repeat CMP in the AM  Severe malnutrition in the context of chronic illness Nutrition Status: Nutrition Problem: Severe Malnutrition Etiology: chronic illness Signs/Symptoms: severe fat depletion, severe muscle depletion   -Nutrition recently been consulted and recommending Nepro shakes p.o. twice daily as well as Prosource +30 mL p.o. twice daily as well as a multivitamin p.o. daily, vitamin C 500 mg p.o. twice daily, zinc to 20 mg p.o. daily for 14 days  -The dietitian feels the patient is a high refeeding risk and recommending monitoring potassium magnesium and phosphorus labs daily until stable and also when the patient daily  Tinea Cruris -Diflucan Weekly Treatment and Nystatin Cream

## 2022-08-27 ENCOUNTER — Inpatient Hospital Stay (HOSPITAL_COMMUNITY): Payer: Medicare Other

## 2022-08-27 ENCOUNTER — Encounter: Payer: Medicare Other | Admitting: Podiatry

## 2022-08-27 DIAGNOSIS — Z89431 Acquired absence of right foot: Secondary | ICD-10-CM

## 2022-08-27 DIAGNOSIS — N1831 Chronic kidney disease, stage 3a: Secondary | ICD-10-CM | POA: Diagnosis not present

## 2022-08-27 DIAGNOSIS — E1122 Type 2 diabetes mellitus with diabetic chronic kidney disease: Secondary | ICD-10-CM | POA: Diagnosis not present

## 2022-08-27 DIAGNOSIS — E1169 Type 2 diabetes mellitus with other specified complication: Secondary | ICD-10-CM | POA: Diagnosis not present

## 2022-08-27 DIAGNOSIS — E1151 Type 2 diabetes mellitus with diabetic peripheral angiopathy without gangrene: Secondary | ICD-10-CM | POA: Diagnosis not present

## 2022-08-27 DIAGNOSIS — I739 Peripheral vascular disease, unspecified: Secondary | ICD-10-CM | POA: Diagnosis not present

## 2022-08-27 DIAGNOSIS — M869 Osteomyelitis, unspecified: Secondary | ICD-10-CM | POA: Diagnosis not present

## 2022-08-27 DIAGNOSIS — M86171 Other acute osteomyelitis, right ankle and foot: Secondary | ICD-10-CM | POA: Diagnosis not present

## 2022-08-27 HISTORY — PX: IR US GUIDE VASC ACCESS RIGHT: IMG2390

## 2022-08-27 HISTORY — PX: IR FLUORO GUIDE CV LINE RIGHT: IMG2283

## 2022-08-27 LAB — COMPREHENSIVE METABOLIC PANEL
ALT: 11 U/L (ref 0–44)
AST: 15 U/L (ref 15–41)
Albumin: 3.3 g/dL — ABNORMAL LOW (ref 3.5–5.0)
Alkaline Phosphatase: 73 U/L (ref 38–126)
Anion gap: 9 (ref 5–15)
BUN: 32 mg/dL — ABNORMAL HIGH (ref 8–23)
CO2: 23 mmol/L (ref 22–32)
Calcium: 8.5 mg/dL — ABNORMAL LOW (ref 8.9–10.3)
Chloride: 102 mmol/L (ref 98–111)
Creatinine, Ser: 1.8 mg/dL — ABNORMAL HIGH (ref 0.61–1.24)
GFR, Estimated: 37 mL/min — ABNORMAL LOW (ref >60)
Glucose, Bld: 158 mg/dL — ABNORMAL HIGH (ref 70–99)
Potassium: 3.7 mmol/L (ref 3.5–5.1)
Sodium: 134 mmol/L — ABNORMAL LOW (ref 135–145)
Total Bilirubin: 1.3 mg/dL — ABNORMAL HIGH (ref 0.3–1.2)
Total Protein: 6.7 g/dL (ref 6.5–8.1)

## 2022-08-27 LAB — CBC WITH DIFFERENTIAL/PLATELET
Abs Immature Granulocytes: 0.03 10*3/uL (ref 0.00–0.07)
Basophils Absolute: 0 10*3/uL (ref 0.0–0.1)
Basophils Relative: 0 %
Eosinophils Absolute: 0.3 10*3/uL (ref 0.0–0.5)
Eosinophils Relative: 3 %
HCT: 41.1 % (ref 39.0–52.0)
Hemoglobin: 13.3 g/dL (ref 13.0–17.0)
Immature Granulocytes: 0 %
Lymphocytes Relative: 26 %
Lymphs Abs: 2.6 10*3/uL (ref 0.7–4.0)
MCH: 27.5 pg (ref 26.0–34.0)
MCHC: 32.4 g/dL (ref 30.0–36.0)
MCV: 85.1 fL (ref 80.0–100.0)
Monocytes Absolute: 0.8 10*3/uL (ref 0.1–1.0)
Monocytes Relative: 8 %
Neutro Abs: 6.1 10*3/uL (ref 1.7–7.7)
Neutrophils Relative %: 63 %
Platelets: 204 10*3/uL (ref 150–400)
RBC: 4.83 MIL/uL (ref 4.22–5.81)
RDW: 13.8 % (ref 11.5–15.5)
WBC: 10 10*3/uL (ref 4.0–10.5)
nRBC: 0 % (ref 0.0–0.2)

## 2022-08-27 LAB — AEROBIC/ANAEROBIC CULTURE W GRAM STAIN (SURGICAL/DEEP WOUND): Gram Stain: NONE SEEN

## 2022-08-27 LAB — GLUCOSE, CAPILLARY
Glucose-Capillary: 153 mg/dL — ABNORMAL HIGH (ref 70–99)
Glucose-Capillary: 156 mg/dL — ABNORMAL HIGH (ref 70–99)
Glucose-Capillary: 178 mg/dL — ABNORMAL HIGH (ref 70–99)
Glucose-Capillary: 206 mg/dL — ABNORMAL HIGH (ref 70–99)
Glucose-Capillary: 276 mg/dL — ABNORMAL HIGH (ref 70–99)

## 2022-08-27 LAB — MAGNESIUM: Magnesium: 2.8 mg/dL — ABNORMAL HIGH (ref 1.7–2.4)

## 2022-08-27 LAB — PHOSPHORUS: Phosphorus: 3.9 mg/dL (ref 2.5–4.6)

## 2022-08-27 LAB — CULTURE, BLOOD (ROUTINE X 2)

## 2022-08-27 MED ORDER — LIDOCAINE-EPINEPHRINE 1 %-1:100000 IJ SOLN
INTRAMUSCULAR | Status: AC
  Filled 2022-08-27: qty 1

## 2022-08-27 MED ORDER — GELATIN ABSORBABLE 12-7 MM EX MISC
CUTANEOUS | Status: AC
  Filled 2022-08-27: qty 1

## 2022-08-27 MED ORDER — SODIUM CHLORIDE 0.9 % IV SOLN: INTRAVENOUS | Status: DC

## 2022-08-27 NOTE — Evaluation (Signed)
Occupational Therapy Evaluation Patient Details Name: Wayne [name] MRN: [id number] DOB: April 24, 1940 Today's Date: 08/27/2022   History of Present Illness 82 y.o. male admitted 5/28 with Rt foot osteomyelitis. 5/30 s/p Tendo Achilles lengthening right and Transmetatarsal amputation right.   PM Hof type 2 diabetes, dyslipidemia, peripheral artery disease.   Clinical Impression   Pt admitted for above dx, PTA patient lived alone with occasional assist from family/friends, pt needing assist at baseline for ADLs with a hx of falls and using RW. Pt currently limited in today's session due to low Bp upon sitting EOB with c/o of mild dizziness. Pt Min A for sup>sit and Max A for sit>sup. OT will f/u with patient as able to fully assess functional capacities when medically able. Pt would benefit from continued acute skilled OT to address above deficits and help transition to next level of care. Patient would benefit from post acute skilled rehab facility with <3 hours of therapy and 24/7 support      Recommendations for follow up therapy are one component of a multi-disciplinary discharge planning process, led by the attending physician.  Recommendations may be updated based on patient status, additional functional criteria and insurance authorization.   Assistance Recommended at Discharge Frequent or constant Supervision/Assistance  Patient can return home with the following A lot of help with walking and/or transfers;A lot of help with bathing/dressing/bathroom;Assistance with cooking/housework;Direct supervision/assist for financial management;Direct supervision/assist for medications management;Assist for transportation    Functional Status Assessment  Patient has had a recent decline in their functional status and demonstrates the ability to make significant improvements in function in a reasonable and predictable amount of time.  Equipment Recommendations  Other (comment) (defer to next level  of care)    Recommendations for Other Services       Precautions / Restrictions Precautions Precautions: Fall;Other (comment) Precaution Comments: watch BP Restrictions Weight Bearing Restrictions: Yes RLE Weight Bearing: Non weight bearing      Mobility Bed Mobility Overal bed mobility: Needs Assistance Bed Mobility: Supine to Sit, Sit to Supine     Supine to sit: Min assist Sit to supine: Max assist        Transfers                   General transfer comment: deferred      Balance Overall balance assessment: Needs assistance Sitting-balance support: Single extremity supported Sitting balance-Leahy Scale: Fair         Standing balance comment: deferred                           ADL either performed or assessed with clinical judgement   ADL Overall ADL's : Needs assistance/impaired Eating/Feeding: Independent;Bed level   Grooming: Bed level;Set up   Upper Body Bathing: Bed level;Set up   Lower Body Bathing: Moderate assistance   Upper Body Dressing : Minimal assistance;Cueing for sequencing   Lower Body Dressing: Maximal assistance;Bed level   Toilet Transfer: Maximal assistance   Toileting- Clothing Manipulation and Hygiene: Minimal assistance   Tub/ Shower Transfer: Maximal assistance     General ADL Comments: Unable to complete OOB mobility due to low BP upon EOB sitting     Vision         Perception     Praxis      Pertinent Vitals/Pain Pain Assessment Pain Assessment: No/denies pain     Hand Dominance Right   Extremity/Trunk Assessment Upper Extremity Assessment Upper  Extremity Assessment: Generalized weakness   Lower Extremity Assessment RLE Deficits / Details: Rt foot bandaged. RLE: Unable to fully assess due to immobilization RLE Sensation: history of peripheral neuropathy       Communication Communication Communication: No difficulties   Cognition Arousal/Alertness: Awake/alert Behavior During  Therapy: WFL for tasks assessed/performed Overall Cognitive Status: History of cognitive impairments - at baseline                                 General Comments: Pt also HOH so difficult to fully assess     General Comments  BP 79/46 upon sitting EOB, BP at 98/48 upon return to supine. RN and MD made aware    Exercises     Shoulder Instructions      Home Living Family/patient expects to be discharged to:: Private residence Living Arrangements: Alone Available Help at Discharge: Friend(s);Available PRN/intermittently Type of Home: House Home Access: Stairs to enter;Elevator Entrance Stairs-Number of Steps: 10, but has installed an Teacher, early years/pre Stairs-Rails: Right Home Layout: One level     Bathroom Shower/Tub: Chief Strategy Officer: Handicapped height     Home Equipment: Agricultural consultant (2 wheels);Cane - single point;Shower seat   Additional Comments: Aide available up to 4 hrs, intermittently. Some additional assist available from family , not 24/7.      Prior Functioning/Environment Prior Level of Function : Independent/Modified Independent;History of Falls (last six months)             Mobility Comments: Uses RW and SPC, sometimes forgets. Has had falls. ADLs Comments: Reports needed help with bath and dress.        OT Problem List: Impaired balance (sitting and/or standing);Decreased knowledge of precautions;Decreased activity tolerance;Decreased strength      OT Treatment/Interventions: Self-care/ADL training;Balance training;Energy conservation;DME and/or AE instruction;Patient/family education;Therapeutic activities    OT Goals(Current goals can be found in the care plan section) Acute Rehab OT Goals Patient Stated Goal: To get back to baseline OT Goal Formulation: With patient/family Time For Goal Achievement: 09/10/22 Potential to Achieve Goals: Good ADL Goals Pt Will Perform Lower Body Dressing: sitting/lateral  leans;with min assist Pt Will Transfer to Toilet: bedside commode;stand pivot transfer;with min assist Additional ADL Goal #1: Pt will tolerate 5 mins EOB sitting in preparation for OOB mobility and transfers Additional ADL Goal #4: Pt will complete chair stand pivot transfer with Min A  OT Frequency: Min 2X/week    Co-evaluation              AM-PAC OT "6 Clicks" Daily Activity     Outcome Measure Help from another person eating meals?: None Help from another person taking care of personal grooming?: A Little Help from another person toileting, which includes using toliet, bedpan, or urinal?: A Lot Help from another person bathing (including washing, rinsing, drying)?: A Lot Help from another person to put on and taking off regular upper body clothing?: A Little Help from another person to put on and taking off regular lower body clothing?: A Lot 6 Click Score: 16   End of Session Equipment Utilized During Treatment: Gait belt  Activity Tolerance: Treatment limited secondary to medical complications (Comment) Patient left: in bed;with call bell/phone within reach;with family/visitor present;Other (comment) (transport team in room)  OT Visit Diagnosis: Unsteadiness on feet (R26.81);Muscle weakness (generalized) (M62.81);History of falling (Z91.81)  Time: 0981-1914 OT Time Calculation (min): 18 min Charges:  OT General Charges $OT Visit: 1 Visit OT Evaluation $OT Eval Moderate Complexity: 1 Mod  08/27/2022  AB, OTR/L  Acute Rehabilitation Services  Office: [phone]   Tristan Schroeder 08/27/2022, 4:53 PM

## 2022-08-27 NOTE — Procedures (Signed)
Interventional Radiology Procedure Note  Procedure: RT IJ TUNNELED PICC    Complications: None  Estimated Blood Loss:  MIN  Findings: SVCRA    M. TREVOR Betha Shadix, MD    

## 2022-08-27 NOTE — NC FL2 (Signed)
[place] MEDICAID FL2 LEVEL OF CARE FORM     IDENTIFICATION  Patient Name: Wayne [name] Birthdate: [birth year] Sex: male Admission Date (Current Location): 08/24/2022  [facility] and [place] Number:  Producer, television/film/video and Address:  [facility] [place], [address] [place], [place] [address]      Provider Number: [id number]  Attending Physician Name and Address:  Merlene Laughter, DO  Relative Name and Phone Number:  [place],Caroline Relative [phone]    Current Level of Care: Hospital Recommended Level of Care: Skilled Nursing Facility Prior Approval Number:    Date Approved/Denied:   PASRR Number: [id number]  Discharge Plan: SNF    Current Diagnoses: Patient Active Problem List   Diagnosis Date Noted   Protein-calorie malnutrition, severe 08/25/2022   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   Malnutrition of moderate degree 05/26/2022   Pyogenic inflammation of bone (HCC) 05/21/2022   Acute osteomyelitis of right foot (HCC) 05/20/2022   Hypertension associated with diabetes (HCC) 02/16/2022   Hyperlipidemia associated with type 2 diabetes mellitus (HCC) 02/16/2022   Vitamin D deficiency 02/16/2022   Primary insomnia 02/16/2022   Closed compression fracture of body of L1 vertebra (HCC) 12/03/2021   Chronic diastolic CHF (congestive heart failure) (HCC) 12/02/2021   Chronic kidney disease, stage 3a (HCC) 12/02/2021   Ischemia of toe 12/01/2021   Constipation 11/30/2021   Fall at home 11/28/2021   Subdural hematoma (HCC) 11/28/2021   Failure to thrive in adult 11/28/2021   Vitamin B12 deficiency 11/28/2021   B12 deficiency anemia 11/28/2021   PAD (peripheral artery disease) (HCC) 11/28/2021   Diabetic neuropathy (HCC) 11/28/2021   Cellulitis of right foot 11/11/2021   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) 11/11/2021   CAD S/P percutaneous coronary angioplasty 11/11/2021   Cellulitis 11/11/2021    Orientation  RESPIRATION BLADDER Height & Weight     Self, Time, Situation, Place  Normal Continent Weight: 169 lb 1.5 oz (76.7 kg) Height:  6' (182.9 cm)  BEHAVIORAL SYMPTOMS/MOOD NEUROLOGICAL BOWEL NUTRITION STATUS      Continent Diet (see discharge summary)  AMBULATORY STATUS COMMUNICATION OF NEEDS Skin   Total Care (non weight bearing, right lower extremity) Verbally Surgical wounds                       Personal Care Assistance Level of Assistance  Bathing, Feeding, Dressing Bathing Assistance: Maximum assistance Feeding assistance: Limited assistance Dressing Assistance: Maximum assistance     Functional Limitations Info  Sight, Hearing, Speech Sight Info: Adequate Hearing Info: Impaired Speech Info: Adequate    SPECIAL CARE FACTORS FREQUENCY  PT (By licensed PT), OT (By licensed OT)     PT Frequency: 5x week OT Frequency: 5x week            Contractures Contractures Info: Not present    Additional Factors Info  Code Status, Allergies, Insulin Sliding Scale Code Status Info: DNR Allergies Info: NKA   Insulin Sliding Scale Info: Novolog: see discharge summary       Current Medications (08/27/2022):  This is the current hospital active medication list Current Facility-Administered Medications  Medication Dose Route Frequency Provider Last Rate Last Admin   (feeding supplement) PROSource Plus liquid 30 mL  30 mL Oral BID BM Sheikh, Omair Latif, DO   30 mL at 08/27/22 1300   acetaminophen (TYLENOL) tablet 650 mg  650 mg Oral Q6H PRN Synetta Fail, MD       Or  acetaminophen (TYLENOL) suppository 650 mg  650 mg Rectal Q6H PRN Synetta Fail, MD       ascorbic acid (VITAMIN C) tablet 500 mg  500 mg Oral BID Sheikh, Omair [place], DO   500 mg at 08/27/22 1610   feeding supplement (NEPRO CARB STEADY) liquid 237 mL  237 mL Oral BID BM SheikhKateri Mc Antietam, DO   237 mL at 08/27/22 1304   fluconazole (DIFLUCAN) tablet 150 mg  150 mg Oral Weekly Sheikh, Omair Latif,  DO       furosemide (LASIX) tablet 40 mg  40 mg Oral Daily Synetta Fail, MD   40 mg at 08/27/22 9604   hydrALAZINE (APRESOLINE) injection 2 mg  2 mg Intravenous Q6H PRN Synetta Fail, MD   2 mg at 08/27/22 1100   insulin aspart (novoLOG) injection 0-9 Units  0-9 Units Subcutaneous TID WC Synetta Fail, MD   2 Units at 08/27/22 1152   metoprolol succinate (TOPROL-XL) 24 hr tablet 12.5 mg  12.5 mg Oral Daily Synetta Fail, MD   12.5 mg at 08/27/22 5409   multivitamin with minerals tablet 1 tablet  1 tablet Oral Daily Marguerita Merles [place], DO   1 tablet at 08/27/22 8119   nystatin cream (MYCOSTATIN)   Topical BID Marguerita Merles [place], DO   Given at 08/27/22 0830   piperacillin-tazobactam (ZOSYN) IVPB 3.375 g  3.375 g Intravenous Marylen Ponto B, MD 12.5 mL/hr at 08/27/22 1300 3.375 g at 08/27/22 1300   sodium chloride flush (NS) 0.9 % injection 3 mL  3 mL Intravenous Q12H Synetta Fail, MD   3 mL at 08/26/22 2058   zinc sulfate capsule 220 mg  220 mg Oral Daily Marguerita Merles [place], DO   220 mg at 08/27/22 1478     Discharge Medications: Please see discharge summary for a list of discharge medications.  Relevant Imaging Results:  Relevant Lab Results:   Additional Information SSN: [id number],  Lorri Frederick, LCSW

## 2022-08-27 NOTE — Progress Notes (Signed)
   Subjective: 1 Day Post-Op Procedure(s) (LRB): MIDFOOT AMPUTATION RIGHT FOOT, BONE BIOPSY RIGHT FOOT, TENDO ACHILIES LENGTHENING (Right) APPLICATION OF WOUND VAC (Right) DOS: 08/26/2022  Patient feeling well.  No pain to the foot.  Dressings are clean dry and intact.  Assessment/Plan: -Incisional wound VAC is in place.  No complications.  Dressings are clean and dry. -Plan to leave the incisional wound VAC in place until the patient is ready for discharge to rehab facility. Will plan to remove incisional VAC prior to discharge to rehab and place orders for wound care.   -NWB surgical extremity -Cultures and pathology pending from surgery 08/26/2022.  ID following.  Appreciate input.  6 weeks postop antibiotics plan for IV therapy. -Will plan to see patient just prior to rehab placement, likely early next week, for removal of the incisional VAC and to place wound care orders post discharge   Objective: Vital signs in last 24 hours: Temp:  [97.6 F (36.4 C)-98.5 F (36.9 C)] 98 F (36.7 C) (05/31 1646) Pulse Rate:  [59-79] 79 (05/31 1646) Resp:  [13-20] 17 (05/31 1646) BP: (78-182)/(41-72) 148/61 (05/31 1646) SpO2:  [92 %-98 %] 93 % (05/31 1646)  Recent Labs    08/25/22 0421 08/26/22 0226 08/27/22 1052  HGB 15.1 14.3 13.3   Recent Labs    08/26/22 0226 08/27/22 1052  WBC 7.3 10.0  RBC 5.19 4.83  HCT 42.7 41.1  PLT 209 204   Recent Labs    08/26/22 0226 08/27/22 1052  NA 140 134*  K 3.8 3.7  CL 104 102  CO2 24 23  BUN 35* 32*  CREATININE 1.83* 1.80*  GLUCOSE 116* 158*  CALCIUM 8.9 8.5*   No results for input(s): "LABPT", "INR" in the last 72 hours.  Felecia Shelling 08/27/2022, 6:26 PM  Felecia Shelling, DPM [facility]  Dr. Felecia Shelling, DPM    [address] [place], [place] [address]                Office [phone]  Fax [phone]

## 2022-08-27 NOTE — Consult Note (Signed)
Referring Provider(s): Sheikh  Supervising Physician: Ruel Favors  Patient Status:  [facility] - In-pt  Chief Complaint:  Osteomyelitis  Brief History:  Wayne [name] is an 82 year old with diabetes, neuropathy, PAD, with acute osteomyelitis of the right forefoot.    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    Patient has been on 8-week course of Augmentin and ciprofloxacin managed/monitored by infectious disease.     He has severe peripheral vascular disease with tibial vessel disease with peroneal inline flow and collateral vessel development in the posterior tibial artery.    He was last evaluated by vascular 12/22/2021.  Per chart, revascularization not an option.  He is seen in IR for placement of a tunneled central venous catheter due to CKD - creatinine currently 1.8.  Allergies: Patient has no known allergies.  Medications: Prior to Admission medications   Medication Sig Start Date End Date Taking? Authorizing Provider  Accu-Chek Softclix Lancets lancets Use 4 times daily as directed to check blood sugars. Patient taking differently: 1 each by Other route. Sig from Pharmacy: Use 4 times daily as directed to check blood sugars. 11/16/21  Yes Tyrone Nine, MD  amoxicillin-clavulanate (AUGMENTIN) 875-125 MG tablet Take 1 tablet by mouth 2 (two) times daily. 07/28/22 08/27/22 Yes [provider]  blood glucose meter kit and supplies KIT Dispense based on patient and insurance preference. Use up to four times daily as directed. Patient taking differently: Inject 1 each into the skin See admin instructions. Dispense based on patient and insurance preference. Use up to four times daily as directed. 11/15/21  Yes Tyrone Nine, MD  blood glucose meter kit and supplies KIT Use up to four times daily as directed. Patient taking differently: Inject 1 each into the skin See admin instructions. Use up to four  times daily as directed. 11/16/21  Yes Tyrone Nine, MD  ciprofloxacin (CIPRO) 750 MG tablet Take 750 mg by mouth 2 (two) times daily. 07/28/22 08/27/22 Yes [provider]  dapagliflozin propanediol (FARXIGA) 10 MG TABS tablet Take 1 tablet (10 mg total) by mouth daily. 04/16/22  Yes Jodelle Gross, NP  furosemide (LASIX) 40 MG tablet Take 1 tablet (40 mg total) by mouth daily. 02/17/22  Yes Rakes, Doralee Albino, FNP  glipiZIDE (GLUCOTROL) 5 MG tablet TAKE 1 TABLET BY MOUTH EVERY DAY BEFORE BREAKFAST Patient taking differently: Take 5 mg by mouth daily before breakfast. 08/24/22  Yes Rakes, Doralee Albino, FNP  glucose blood test strip Use as instructed Patient taking differently: 1 each by Other route See admin instructions. Use as instructed 06/13/12  Yes Eber Hong, MD  glucose blood test strip Use 4 times daily as directed to check bloos sugars. Patient taking differently: 1 each by Other route See admin instructions. Use 4 times daily as directed to check bloos sugars. 11/16/21  Yes Tyrone Nine, MD  metoprolol succinate (TOPROL-XL) 25 MG 24 hr tablet TAKE 1/2 TABLET BY MOUTH DAILY Patient taking differently: Take 12.5-25 mg by mouth See admin instructions. Take 25 mg in the morning and 12.5 mg at night 07/13/22  Yes Rakes, Doralee Albino, FNP  Multiple Vitamin (MULTIVITAMIN WITH MINERALS) TABS tablet Take 1 tablet by mouth daily.   Yes [provider]  ciclopirox (PENLAC) 8 % solution Apply topically at bedtime. Apply over nail and surrounding skin. Apply daily over previous coat. After seven (7) days, may remove with alcohol and continue  cycle. Patient not taking: Reported on 08/24/2022 05/04/22   Candelaria Stagers, DPM     Vital Signs: BP (!) 126/55 (BP Location: Left Arm)   Pulse 62   Temp 97.8 F (36.6 C)   Resp 16   Ht 6' (1.829 m)   Wt 169 lb 1.5 oz (76.7 kg)   SpO2 98%   BMI 22.93 kg/m   Physical Exam Constitutional:      Appearance: Normal appearance.  HENT:     Head:  Normocephalic and atraumatic.  Cardiovascular:     Rate and Rhythm: Normal rate.  Pulmonary:     Effort: Pulmonary effort is normal. No respiratory distress.  Abdominal:     Palpations: Abdomen is soft.     Tenderness: There is no abdominal tenderness.  Skin:    General: Skin is warm and dry.  Neurological:     General: No focal deficit present.     Mental Status: He is alert and oriented to person, place, and time.  Psychiatric:        Mood and Affect: Mood normal.        Behavior: Behavior normal.        Thought Content: Thought content normal.        Judgment: Judgment normal.      Labs:  CBC: Recent Labs    08/24/22 1105 08/25/22 0421 08/26/22 0226 08/27/22 1052  WBC 5.5 6.3 7.3 10.0  HGB 14.9 15.1 14.3 13.3  HCT 47.3 45.7 42.7 41.1  PLT 205 210 209 204    COAGS: Recent Labs    11/11/21 1025 11/28/21 0925 05/20/22 2013  INR 1.1 1.2 1.2  APTT  --  30  --     BMP: Recent Labs    08/24/22 1105 08/25/22 0421 08/26/22 0226 08/27/22 1052  NA 135 138 140 134*  K 4.4 4.6 3.8 3.7  CL 104 104 104 102  CO2 22 26 24 23   GLUCOSE 150* 115* 116* 158*  BUN 35* 31* 35* 32*  CALCIUM 9.1 9.1 8.9 8.5*  CREATININE 1.53* 1.52* 1.83* 1.80*  GFRNONAA 45* 45* 36* 37*    LIVER FUNCTION TESTS: Recent Labs    08/24/22 1105 08/25/22 0421 08/26/22 0226 08/27/22 1052  BILITOT 0.8 1.1 1.2 1.3*  AST 14* 13* 13* 15  ALT 13 10 12 11   ALKPHOS 99 91 81 73  PROT 8.0 7.1 7.1 6.7  ALBUMIN 3.7 3.3* 3.4* 3.3*    Assessment and Plan:  Osteomyelitis - need for central venous access for long term IV antibiotics - request for tunneled central line to preserve arm veins in case of future hemodialysis fistula/graft placement requirement.  Risks and benefits discussed with the patient including, but not limited to bleeding, infection, vascular injury, pneumothorax which may require chest tube placement, air embolism or even death  All of the patient's questions were answered,  patient is agreeable to proceed. Consent signed and in chart.   Electronically Signed: Gwynneth Macleod, PA-C 08/27/2022, 2:52 PM    I spent a total of 15 Minutes at the the patient's bedside AND on the patient's hospital floor or unit, greater than 50% of which was counseling/coordinating care for tunneled central venous catheter placement.

## 2022-08-27 NOTE — Progress Notes (Addendum)
[facility]  Date of Admission:  08/24/2022           Reason for visit: Follow up on Osteomyelitis  Current antibiotics: Zosyn  ASSESSMENT:    82 y.o. male admitted with:  Right foot osteomyelitis: Patient presenting with worsening osteomyelitis of his right foot with prior attempts at limb salvage with minor amputation and prolonged antibiotics.  Seen by Dr. Logan Bores from podiatry and taken to the OR 08/26/22 for right transmetatarsal amputation for further attempted foot salvage and prevention of BKA. Cultures and pathology pending from TMA site.   Peripheral artery disease: Patient has seen vascular surgery in the past and per their documentation has been optimized from a blood flow perspective. 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 Chronic kidney disease stage IIIa: Creatinine yesterday is slightly above baseline with a creatinine clearance 33.2.  RECOMMENDATIONS:    Continue pip/tazo based on prior cultures Follow up cultures and pathology which appear to have been taken from remaining foot post-TMA Plan for 6 weeks of post-operative antibiotics per Dr Logan Bores operative note where clean margins are not suspected.  He has failed oral antibiotics so will plan for IV therapy Place central access.  Based on CrCl, TRH will consult IR for tunneled IJ Will follow up cultures and path over the weekend.  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)   CAD S/P percutaneous coronary angioplasty   PAD (peripheral artery disease) (HCC)   Diabetic neuropathy (HCC)   Chronic diastolic CHF (congestive heart failure) (HCC)   Chronic kidney disease, stage 3a (HCC)   Hypertension associated with diabetes (HCC)   Hyperlipidemia associated with type 2 diabetes mellitus (HCC)    Protein-calorie malnutrition, severe    MEDICATIONS:    Scheduled Meds:  (feeding supplement) PROSource Plus  30 mL Oral BID BM   ascorbic acid  500 mg Oral BID   feeding supplement (NEPRO CARB STEADY)  237 mL Oral BID BM   fluconazole  150 mg Oral Weekly   furosemide  40 mg Oral Daily   insulin aspart  0-9 Units Subcutaneous TID WC   metoprolol succinate  12.5 mg Oral Daily   multivitamin with minerals  1 tablet Oral Daily   nystatin cream   Topical BID   sodium chloride flush  3 mL Intravenous Q12H   zinc sulfate  220 mg Oral Daily   Continuous Infusions:  piperacillin-tazobactam (ZOSYN)  IV 3.375 g (08/27/22 0515)   PRN Meds:.acetaminophen **OR** acetaminophen, hydrALAZINE  SUBJECTIVE:   24 hour events:  Status post TMA last night Bone biopsy and cultures were taken following TMA Gram stain is negative No other acute events are noted  Patient sleeping comfortably.  Review of Systems  All other systems reviewed and are negative.     OBJECTIVE:   Blood pressure 136/65, pulse (!) 59, temperature 97.6 F (36.4 C), resp. rate 16, height 6' (1.829 m), weight 76.7 kg, SpO2 98 %. Body mass index is 22.93 kg/m.  Physical Exam Constitutional:      Comments: Patient sleeping in bed this morning.  Appears comfortable.  Did not awaken from sleep.  HENT:     Head: Normocephalic and atraumatic.  Pulmonary:     Effort: Pulmonary effort is normal. No respiratory distress.  Abdominal:  General: There is no distension.     Palpations: Abdomen is soft.  Musculoskeletal:     Comments: Status post right TMA.  Skin:    General: Skin is warm and dry.      Lab Results: Lab Results  Component Value Date   WBC 7.3 08/26/2022   HGB 14.3 08/26/2022   HCT 42.7 08/26/2022   MCV 82.3 08/26/2022   PLT 209 08/26/2022    Lab Results  Component Value Date   NA 140 08/26/2022   K 3.8 08/26/2022   CO2 24 08/26/2022   GLUCOSE 116 (H) 08/26/2022   BUN 35 (H) 08/26/2022    CREATININE 1.83 (H) 08/26/2022   CALCIUM 8.9 08/26/2022   GFRNONAA 36 (L) 08/26/2022   GFRAA >90 06/13/2012    Lab Results  Component Value Date   ALT 12 08/26/2022   AST 13 (L) 08/26/2022   ALKPHOS 81 08/26/2022   BILITOT 1.2 08/26/2022       Component Value Date/Time   CRP 18.5 (H) 07/20/2022 0200       Component Value Date/Time   ESRSEDRATE 31 (H) 07/20/2022 0200     I have reviewed the micro and lab results in Epic.  Imaging: DG Foot Complete Right  Result Date: 08/26/2022 CLINICAL DATA:  Status post transmetatarsal amputation EXAM: RIGHT FOOT COMPLETE - 3+ VIEW COMPARISON:  08/24/2018 FINDINGS: There is been interval transmetatarsal amputation. Multiple surgical staples are seen. No acute bony abnormality is noted. Calcaneal spurring is seen. Wound VAC is noted laterally. 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 Electronically Signed   By: Alcide Clever M.D.   On: 08/26/2022 21:10   DG MINI C-ARM IMAGE ONLY  Result Date: 08/26/2022 There is no interpretation for this exam.  This order is for images obtained during a surgical procedure.  Please See "Surgeries" Tab for more information regarding the procedure.     Imaging independently reviewed in Epic.    Vedia Coffer for [facility]ious [facility] [phone] pager 08/27/2022, 5:27 AM

## 2022-08-27 NOTE — Progress Notes (Addendum)
PROGRESS NOTE    Wayne [name]  ZOX:[id number] DOB: 08-06-1940 DOA: 08/24/2022 PCP: Sonny Masters, FNP   Brief Narrative:  he patient is an 82 year old Caucasian male with a past medical history significant for but limited to diabetes mellitus type 2, neuropathy, PAD, hypertension, hyperlipidemia, CKD stage IIIa, subdural hematoma, chronic diastolic CHF, CAD as well as other comorbidities who presented with right foot osteomyelitis and pain.  He has been having ongoing right foot pain and he had a previous right fifth metatarsal amputation and has been following with wound care, infectious disease and podiatry in outpatient setting.  He been on 8-week course of Augmentin and ciprofloxacin and follow-up with his podiatrist yesterday and there is concern for osteomyelitis which was confirmed on the x-ray in the office and there is also new fracture noted.  He was sent to the ED for IV antibiotics and admission for surgical intervention.  In the ED he was noted to have elevated blood pressures and heart rate in the 50s.  Lab workup was revealing BUN/creatinine of 35/1.35.  Blood cultures obtained and right foot x-rays were done in the podiatry office were not currently visible or reported.  Podiatry recommended MRI and antibiotics and he was started on IV Zosyn.  ID has been consulted and they are recommending continue IV Zosyn pending further surgical intervention and plan is for TMA today of his right foot with a bone biopsy however he did eat breakfast this morning and not sure if this will delay his surgery or push it off till tomorrow.     Assessment and Plan: No notes have been filed under this hospital service. Service: Hospitalist   Osteomyelitis of right fourth metatarsal status post a tendo Achilles lengthening on the right, transmetatarsal amputation on the right and a bone biopsy of the fourth metatarsal in the right POD1 -History of prior foot infection status amputation of right fifth  metatarsal. -Has had ongoing foot pain and was at podiatry's office today for debridement/follow-up.  Concern for right fourth metatarsal changes. -X-ray in the office reportedly confirmed fracture and osteomyelitis.  Patient sent to the ED for IV antibiotics and possible surgical intervention. -MRI done and showed " Interval amputation of the 5th ray through the base of the 5th metatarsal. 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 Probable early osteomyelitis involving the head of the 3rd metatarsal, the proximal 4th phalanx and distal half of the remaining 5th metatarsal. Soft tissue ulceration lateral to the 4th metatarsal with surrounding inflammation. No focal fluid collection. Atrophy and T2 hyperintensity throughout the forefoot musculature." -Patient started on Zosyn in the ED, podiatry was consulted and patient was taken to the OR on 08/26/2022 and had a bone biopsy of the fourth metatarsal as well as a transmetatarsal amputation on the right and tendo Achilles lengthening; per Dr. Logan Bores he does not suspect clean osseous margins postoperatively and will need to continue antibiotic monitoring -Continue to monitor on telemetry overnight -Appreciate podiatry recommendations and assistance -Continue with IV Zosyn and given his poor chance of healing we have consulted infectious diseases for further antibiotic management -ID consulted and recommending continuing IV Zosyn pending further surgical evaluation and they believe that the patient exhausted all his efforts at limb salvage progression with osteomyelitis suspected prolonged antibiotic therapy and attempt at limb salvage and 1 amputation November 2024.  They feel that he needs a more definitive surgery at this time and recommending having the  patient consider a BKA but patient is undergoing a TMA for further attempted foot salvage and prevention of BKA; unfortunately patient  did not have clean margins but ID is now recommending continue antibiotics for 6 weeks and recommending IV Zosyn and following of the cultures and pathology.  They recommend IV antibiotics and they feel he has failed oral antibiotics.  Given his renal function they are requesting a central Cathter for Prolonged IV Access so have asked IR to Place to try and preserve arm veins in case of future hemodialysis/fistula/graft placement requirement -Trend fever curve and WBC -Follow-up blood cultures and intraoperative cultures and ID feels that the duration of postoperative antibiotics will be dependent on his surgical margins and whether if it is felt that all osteomyelitic bone has been removed but they are anticipating at least 4 weeks of postoperative antibiotic therapy   PAD -Known history of PAD.  Did have angiogram in August 2023 with runoff.   -This was notable for right pedal circulation provided only by posterior tibial artery.  This is relevant to healing in the setting of osteomyelitis above. -Had previously been on aspirin and do not see this on patient's medication list currently -Saw Dr. [name] of Vascular surgery and has examined been worsened last September.  His angiography showed no options for reconstruction and he did have an inflow to the ankle via the peroneal only runoff which had robust arborization in the posterior tibial artery.  Fortunately there is no improvement chances is healing from a vascular standpoint at that time.   Diabetes Mellitus Type 2 -Currently holding glipizide 5 mg p.o. daily before breakfast and Dapagliflozin 10 mg po Daily -Most recent HbA1c was 5.9 -Continue Sensitive Novolog SSI AC -Continue to Monitor CBG's per Protocol; CBG Trend: Recent Labs  Lab 08/26/22 0741 08/26/22 1126 08/26/22 1553 08/26/22 1915 08/26/22 2359 08/27/22 0727 08/27/22 1115  GLUCAP 126* 165* 85 96 206* 156* 153*    Hypertension -> Hypotension and Orthostatic VS -Continued  home Furosemide 40 mg po Daily and Metoprolol Succinate 12.5 mg po Daily we will now discontinue furosemide and discontinue the metoprolol -Continue to Monitor BP per Protocol -Last BP Reading was 26/55 but dropped down to 78/41 when he became very orthostatic -Adjust BP Meds as necessary   Hyperlipidemia -Do not currently see patient on any medications for this on MAR   Chronic Diastolic CHF  -Echo earlier this year with EF 50-55%, G2 DD, normal RV function. -Continue home Furosemide 40 mg po Daily and Metoprolol Succinate 12.5 mg po Daily; Currently holding Dapaglifozin 10 mg po Daily  -Strict I's and O's and Daily Weights.  Intake/Output Summary (Last 24 hours) at 08/27/2022 1612 Last data filed at 08/27/2022 1035 Gross per 24 hour  Intake 780 ml  Output 600 ml  Net 180 ml  -Will start Gentle IVF Hydration given Orthostasis -Continue to Monitor for S/Sx of Volume Overload  Orthostatic Hypotension -BP dropped and patient had a near syncopal episode -Hold Lasix -Start gentle IVF Hydration; Order TED Hose (on the Left) and Abdominal Binder -Repeat Orthostatic VS in the AM -Continue PT/OT to Evaluate and Treat -Now BP is 126/55   CAD -Chart review shows history of angioplasty. -Recently seen Dr. Antoine Poche with cardiology and he had an echocardiogram in February of this year which showed an EF of 50 to 55% -Continue Metoprolol Succinate 12.5 mg po Daily but holding Lasix now   AKI on CKD Stage 3a -BUN/Cr Trend: Recent Labs  Lab 08/19/22  1432 08/24/22 1105 08/25/22 0421 08/26/22 0226 08/27/22 1052  BUN 43* 35* 31* 35* 32*  CREATININE 1.48* 1.53* 1.52* 1.83* 1.80*  -Avoid Nephrotoxic Medications, Contrast Dyes, Hypotension and Dehydration to Ensure Adequate Renal Perfusion and will need to Renally Adjust Meds -Was given IV Lasix and now changed back to oral Lasix but will hold due to Orthostatic Hypotension -Continue to Monitor and Trend Renal Function carefully and repeat CMP  in the AM    Hypoalbuminemia -Patient's Albumin Trend: Recent Labs  Lab 08/19/22 1432 08/24/22 1105 08/25/22 0421 08/26/22 0226 08/27/22 1052  ALBUMIN 4.0 3.7 3.3* 3.4* 3.3*  -Continue to Monitor and Trend and repeat CMP in the AM  Severe malnutrition in the context of chronic illness Nutrition Status: Nutrition Problem: Severe Malnutrition Etiology: chronic illness Signs/Symptoms: severe fat depletion, severe muscle depletion   -Nutrition recently been consulted and recommending Nepro shakes p.o. twice daily as well as Prosource +30 mL p.o. twice daily as well as a multivitamin p.o. daily, vitamin C 500 mg p.o. twice daily, zinc to 20 mg p.o. daily for 14 days  -The dietitian feels the patient is a high refeeding risk and recommending monitoring potassium magnesium and phosphorus labs daily until stable and also when the patient daily  Tinea Cruris -Diflucan Weekly Treatment and Nystatin CreamDVT prophylaxis: Place TED hose Start: 08/27/22 1532 Place TED hose Start: 08/27/22 1422 SCDs Start: 08/26/22 2051 SCDs Start: 08/24/22 1253    Code Status: DNR Family Communication: No family present at bedside  Disposition Plan:  Level of care: Telemetry Surgical Status is: Inpatient Remains inpatient appropriate because: PT and OT to further evaluate and treat for safe discharge disposition and they are recommending SNF; he was orthostatic today so we will off his Lasix and start IV for hydration and ordered TED hose and abdominal binder.  ID is recommending long-term antibiotics with IV given failed oral treatments and will place a IJ central PICC for antibiotics.    Consultants:  Podiatry Infectious Diseases  IR  Procedures:  As delineated as above  Procedure by Dr. Logan Bores:  1.  Tendo Achilles lengthening right 2.  Transmetatarsal amputation right 3.  Bone biopsy fourth metatarsal right   Antimicrobials:  Anti-infectives (From admission, onward)    Start     Dose/Rate  Route Frequency Ordered Stop   08/26/22 1400  fluconazole (DIFLUCAN) tablet 150 mg        150 mg Oral Weekly 08/26/22 1236 09/23/22 1359   08/24/22 2200  piperacillin-tazobactam (ZOSYN) IVPB 3.375 g        3.375 g 12.5 mL/hr over 240 Minutes Intravenous Every 8 hours 08/24/22 1248     08/24/22 1300  piperacillin-tazobactam (ZOSYN) IVPB 3.375 g        3.375 g 100 mL/hr over 30 Minutes Intravenous  Once 08/24/22 1248 08/24/22 1403       Subjective: Seen and examined at bedside and he was resting and awoken from sleep and denied any chest pain or shortness of breath.  Not a pain at this time.  Subsequently later this afternoon he worked with therapy became orthostatic.  Will stop his Lasix.  Given that the patient did not have clean margins on his amputation ID recommends 6 weeks of IV antibiotics and so central IJ PICC to be placed.  No other concerns reported at this time but PT OT recommending SNF.  Objective: Vitals:   08/27/22 0734 08/27/22 1050 08/27/22 1118 08/27/22 1646  BP: (!) 146/58 (!) 182/72 (!) 126/55 Marland Kitchen)  148/61  Pulse: 60 62  79  Resp: 16   17  Temp: 97.8 F (36.6 C)   98 F (36.7 C)  TempSrc:    Oral  SpO2: 98%   93%  Weight:      Height:        Intake/Output Summary (Last 24 hours) at 08/27/2022 1738 Last data filed at 08/27/2022 1035 Gross per 24 hour  Intake 780 ml  Output 600 ml  Net 180 ml   Filed Weights   08/24/22 1053 08/25/22 0500  Weight: 78.9 kg 76.7 kg   Examination: Physical Exam:  Constitutional: WN/WD chronically ill-appearing elderly Caucasian male Respiratory: Diminished to auscultation bilaterally, no wheezing, rales, rhonchi or crackles. Normal respiratory effort and patient is not tachypenic. No accessory muscle use. Unlabored breathing  Cardiovascular: RRR, no murmurs / rubs / gallops. S1 and S2 auscultated. No extremity edema.  Abdomen: Soft, non-tender, non-distended. Bowel sounds positive.  GU: Deferred. Musculoskeletal: No clubbing  / cyanosis of digits/nails. Has a right TMA Skin: No rashes, lesions, ulcers on a limited skin evaluation. No induration; Warm and dry.  Neurologic: CN 2-12 grossly intact with no focal deficits. Romberg sign and cerebellar reflexes not assessed.  Psychiatric: He is impaired judgment.  Was a little somnolent and drowsy but easily arousable and becomes awake and alert but not fully oriented.  Data Reviewed: I have personally reviewed following labs and imaging studies  CBC: Recent Labs  Lab 08/24/22 1105 08/25/22 0421 08/26/22 0226 08/27/22 1052  WBC 5.5 6.3 7.3 10.0  NEUTROABS 3.2  --  4.1 6.1  HGB 14.9 15.1 14.3 13.3  HCT 47.3 45.7 42.7 41.1  MCV 86.0 85.3 82.3 85.1  PLT 205 210 209 204   Basic Metabolic Panel: Recent Labs  Lab 08/24/22 1105 08/25/22 0421 08/26/22 0226 08/27/22 1052  NA 135 138 140 134*  K 4.4 4.6 3.8 3.7  CL 104 104 104 102  CO2 22 26 24 23   GLUCOSE 150* 115* 116* 158*  BUN 35* 31* 35* 32*  CREATININE 1.53* 1.52* 1.83* 1.80*  CALCIUM 9.1 9.1 8.9 8.5*  MG  --   --  2.7* 2.8*  PHOS  --   --  4.2 3.9   GFR: Estimated Creatinine Clearance: 33.7 mL/min (A) (by C-G formula based on SCr of 1.8 mg/dL (H)). Liver Function Tests: Recent Labs  Lab 08/24/22 1105 08/25/22 0421 08/26/22 0226 08/27/22 1052  AST 14* 13* 13* 15  ALT 13 10 12 11   ALKPHOS 99 91 81 73  BILITOT 0.8 1.1 1.2 1.3*  PROT 8.0 7.1 7.1 6.7  ALBUMIN 3.7 3.3* 3.4* 3.3*   No results for input(s): "LIPASE", "AMYLASE" in the last 168 hours. No results for input(s): "AMMONIA" in the last 168 hours. Coagulation Profile: No results for input(s): "INR", "PROTIME" in the last 168 hours. Cardiac Enzymes: No results for input(s): "CKTOTAL", "CKMB", "CKMBINDEX", "TROPONINI" in the last 168 hours. BNP (last 3 results) No results for input(s): "PROBNP" in the last 8760 hours. HbA1C: No results for input(s): "HGBA1C" in the last 72 hours. CBG: Recent Labs  Lab 08/26/22 1915 08/26/22 2359  08/27/22 0727 08/27/22 1115 08/27/22 1651  GLUCAP 96 206* 156* 153* 276*   Lipid Profile: No results for input(s): "CHOL", "HDL", "LDLCALC", "TRIG", "CHOLHDL", "LDLDIRECT" in the last 72 hours. Thyroid Function Tests: No results for input(s): "TSH", "T4TOTAL", "FREET4", "T3FREE", "THYROIDAB" in the last 72 hours. Anemia Panel: No results for input(s): "VITAMINB12", "FOLATE", "FERRITIN", "TIBC", "IRON", "RETICCTPCT" in the  last 72 hours. Sepsis Labs: Recent Labs  Lab 08/24/22 1105  LATICACIDVEN 1.1    Recent Results (from the past 240 hour(s))  Blood Cultures x 2 sites     Status: None (Preliminary result)   Collection Time: 08/24/22 10:56 AM   Specimen: BLOOD RIGHT FOREARM  Result Value Ref Range Status   Specimen Description BLOOD RIGHT FOREARM  Final   Special Requests   Final    BOTTLES DRAWN AEROBIC AND ANAEROBIC Blood Culture adequate volume   Culture   Final    NO GROWTH 3 DAYS Performed at [facility], [address] [place], [place] [address]    Report Status PENDING  Incomplete  Blood Cultures x 2 sites     Status: None (Preliminary result)   Collection Time: 08/24/22  3:54 PM   Specimen: BLOOD  Result Value Ref Range Status   Specimen Description BLOOD RIGHT ANTECUBITAL  Final   Special Requests   Final    BOTTLES DRAWN AEROBIC AND ANAEROBIC Blood Culture adequate volume   Culture   Final    NO GROWTH 3 DAYS Performed at [facility], [address] [place], [place] [address]    Report Status PENDING  Incomplete  Aerobic/Anaerobic Culture w Gram Stain (surgical/deep wound)     Status: None (Preliminary result)   Collection Time: 08/26/22  6:23 PM   Specimen: Soft Tissue, Other  Result Value Ref Range Status   Specimen Description TISSUE  Final   Special Requests RT 4TH METATARSAL  Final   Gram Stain NO WBC SEEN NO ORGANISMS SEEN   Final   Culture   Final    NO GROWTH < 12 HOURS Performed at [facility], [address]  [place], [place] [address]    Report Status PENDING  Incomplete    Radiology Studies: IR Fluoro Guide CV Line Right  Result Date: 08/27/2022 INDICATION: Osteomyelitis, access for long-term IV antibiotics EXAM: TUNNELED PICC LINE WITH ULTRASOUND AND FLUOROSCOPIC GUIDANCE MEDICATIONS: 1% lidocaine local. ANESTHESIA/SEDATION: None. FLUOROSCOPY: Radiation Exposure Index (as provided by the fluoroscopic device): 1.0 mGy Kerma COMPLICATIONS: None immediate. PROCEDURE: Informed written consent was obtained from the patient after a discussion of the risks, benefits, and alternatives to treatment. Questions regarding the procedure were encouraged and answered. The right neck and chest were prepped with chlorhexidine in a sterile fashion, and a sterile drape was applied covering the operative field. Maximum barrier sterile technique with sterile gowns and gloves were used for the procedure. A timeout was performed prior to the initiation of the procedure. After creating a small venotomy incision, a micropuncture kit was utilized to access the right internal jugular vein under direct, real-time ultrasound guidance after the overlying soft tissues were anesthetized with 1% lidocaine with epinephrine. Ultrasound image documentation was performed. The microwire was kinked to measure appropriate catheter length. The micropuncture sheath was exchanged for a peel-away sheath over a guidewire. A 5 French dual lumen tunneled PICC measuring 29 cm was tunneled in a retrograde fashion from the anterior chest wall to the venotomy incision. The catheter was then placed through the peel-away sheath with tip ultimately positioned at the superior caval-atrial junction. Final catheter positioning was confirmed and documented with a spot radiographic image. The catheter aspirates and flushes normally. The catheter was flushed with appropriate volume heparin dwells. The catheter exit site was secured with a 2-0 Ethilon retention suture. The  venotomy incision was closed with Dermabond. Dressings were applied. The patient tolerated  the procedure well without immediate post procedural complication. FINDINGS: After catheter placement, the tip lies within the superior cavoatrial junction. The catheter aspirates and flushes normally and is ready for immediate use. IMPRESSION: Successful placement of 29cm dual lumen tunneled PICC catheter via the right internal jugular vein with tip terminating at the superior caval atrial junction. The catheter is ready for immediate use. Electronically Signed   By: Judie Petit.  Shick M.D.   On: 08/27/2022 17:11   IR US Guide Vasc Access Right  Result Date: 08/27/2022 INDICATION: Osteomyelitis, access for long-term IV antibiotics EXAM: TUNNELED PICC LINE WITH ULTRASOUND AND FLUOROSCOPIC GUIDANCE MEDICATIONS: 1% lidocaine local. ANESTHESIA/SEDATION: None. FLUOROSCOPY: Radiation Exposure Index (as provided by the fluoroscopic device): 1.0 mGy Kerma COMPLICATIONS: None immediate. PROCEDURE: Informed written consent was obtained from the patient after a discussion of the risks, benefits, and alternatives to treatment. Questions regarding the procedure were encouraged and answered. The right neck and chest were prepped with chlorhexidine in a sterile fashion, and a sterile drape was applied covering the operative field. Maximum barrier sterile technique with sterile gowns and gloves were used for the procedure. A timeout was performed prior to the initiation of the procedure. After creating a small venotomy incision, a micropuncture kit was utilized to access the right internal jugular vein under direct, real-time ultrasound guidance after the overlying soft tissues were anesthetized with 1% lidocaine with epinephrine. Ultrasound image documentation was performed. The microwire was kinked to measure appropriate catheter length. The micropuncture sheath was exchanged for a peel-away sheath over a guidewire. A 5 French dual lumen  tunneled PICC measuring 29 cm was tunneled in a retrograde fashion from the anterior chest wall to the venotomy incision. The catheter was then placed through the peel-away sheath with tip ultimately positioned at the superior caval-atrial junction. Final catheter positioning was confirmed and documented with a spot radiographic image. The catheter aspirates and flushes normally. The catheter was flushed with appropriate volume heparin dwells. The catheter exit site was secured with a 2-0 Ethilon retention suture. The venotomy incision was closed with Dermabond. Dressings were applied. The patient tolerated the procedure well without immediate post procedural complication. FINDINGS: After catheter placement, the tip lies within the superior cavoatrial junction. The catheter aspirates and flushes normally and is ready for immediate use. IMPRESSION: Successful placement of 29cm dual lumen tunneled PICC catheter via the right internal jugular vein with tip terminating at the superior caval atrial junction. The catheter is ready for immediate use. Electronically Signed   By: Judie Petit.  Shick M.D.   On: 08/27/2022 17:11   DG Foot Complete Right  Result Date: 08/26/2022 CLINICAL DATA:  Status post transmetatarsal amputation EXAM: RIGHT FOOT COMPLETE - 3+ VIEW COMPARISON:  08/24/2018 FINDINGS: There is been interval transmetatarsal amputation. Multiple surgical staples are seen. No acute bony abnormality is noted. Calcaneal spurring is seen. Wound VAC is noted laterally. 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 Electronically Signed   By: Alcide Clever M.D.   On: 08/26/2022 21:10   DG MINI C-ARM IMAGE ONLY  Result Date: 08/26/2022 There is no interpretation for this exam.  This order is for images obtained during a surgical procedure.  Please See "Surgeries" Tab for more information regarding the procedure.    Scheduled Meds:  (feeding supplement) PROSource Plus  30 mL Oral BID BM   ascorbic  acid  500 mg Oral BID   feeding supplement (NEPRO CARB STEADY)  237 mL Oral BID BM   fluconazole  150 mg Oral Weekly  insulin aspart  0-9 Units Subcutaneous TID WC   metoprolol succinate  12.5 mg Oral Daily   multivitamin with minerals  1 tablet Oral Daily   nystatin cream   Topical BID   sodium chloride flush  3 mL Intravenous Q12H   zinc sulfate  220 mg Oral Daily   Continuous Infusions:  sodium chloride 75 mL/hr at 08/27/22 1647   piperacillin-tazobactam (ZOSYN)  IV 3.375 g (08/27/22 1300)    LOS: 2 days   Marguerita Merles, DO [facility] Available via Epic secure chat 7am-7pm After these hours, please refer to coverage provider listed on [email or web address] 08/27/2022, 5:38 PM

## 2022-08-27 NOTE — Evaluation (Addendum)
Physical Therapy Evaluation Patient Details Name: Wayne [name] MRN: [id number] DOB: 1940-10-24 Today's Date: 08/27/2022  History of Present Illness  82 y.o. male admitted 5/28 with Rt foot osteomyelitis. 5/30 s/p Tendo Achilles lengthening right and Transmetatarsal amputation right.   PM Hof type 2 diabetes, dyslipidemia, peripheral artery disease.  Clinical Impression  Patient is s/p above surgery resulting in functional limitations due to the deficits listed below (see PT Problem List). PTA pt lives alone with assist from family and aide for bathing and dressing, reported falls with RW and SPC use. Limited evaluation today as pt had episode of syncope upon sitting EOB. Required min assist to rise and scoot, total assist to return to supine. BP in supine 197/71 HR 62; rapidly regained consciousness upon returning to supine. RN to room assessing pt. Denies symptoms. Further transfer training deferred at this time. Family present and supportive. Agreeable to SNF due to NWB status on RLE, with prior hx of falls and difficulty with mobility. Patient will benefit from acute skilled PT to increase their independence and safety with mobility to facilitate discharge. Patient will benefit from continued inpatient follow up therapy, <3 hours/day       Family requests [facility] for rehab.   Recommendations for follow up therapy are one component of a multi-disciplinary discharge planning process, led by the attending physician.  Recommendations may be updated based on patient status, additional functional criteria and insurance authorization.  Follow Up Recommendations Can patient physically be transported by private vehicle: No     Assistance Recommended at Discharge Frequent or constant Supervision/Assistance  Patient can return home with the following  Two people to help with walking and/or transfers;A lot of help with bathing/dressing/bathroom;Assistance with cooking/housework;Direct  supervision/assist for medications management;Direct supervision/assist for financial management;Assist for transportation    Equipment Recommendations  (TBD next venue)  Recommendations for Other Services       Functional Status Assessment Patient has had a recent decline in their functional status and demonstrates the ability to make significant improvements in function in a reasonable and predictable amount of time.     Precautions / Restrictions Precautions Precautions: Fall;Other (comment) Precaution Comments: syncopal episode 5/31 Restrictions Weight Bearing Restrictions: Yes RLE Weight Bearing: Non weight bearing      Mobility  Bed Mobility Overal bed mobility: Needs Assistance Bed Mobility: Supine to Sit, Sit to Supine     Supine to sit: Min assist Sit to supine: Total assist   General bed mobility comments: Min assist to rise to EOB and scoot to edge with cues for weight shift. Experienced syncopal episode, total assist to return to supine.    Transfers                   General transfer comment: Deferred due to syncope    Ambulation/Gait                  Stairs            Wheelchair Mobility    Modified Rankin (Stroke Patients Only)       Balance Overall balance assessment: Needs assistance Sitting-balance support: Single extremity supported Sitting balance-Leahy Scale: Poor   Postural control: Left lateral lean                                   Pertinent Vitals/Pain Pain Assessment Pain Assessment: Faces Faces Pain Scale: Hurts a little bit Pain Location:  back Pain Descriptors / Indicators: Aching Pain Intervention(s): Monitored during session, Repositioned    Home Living Family/patient expects to be discharged to:: Private residence Living Arrangements: Alone Available Help at Discharge: Friend(s);Available PRN/intermittently Type of Home: House Home Access: Stairs to enter;Elevator Entrance  Stairs-Rails: Right Entrance Stairs-Number of Steps: 10, but has installed an elevator   Home Layout: One level Home Equipment: Agricultural consultant (2 wheels);Cane - single point;Shower seat Additional Comments: Aide available up to 4 hrs, intermittently. Some additional assist available from family.   Prior Function Prior Level of Function : Independent/Modified Independent;History of Falls (last six months)             Mobility Comments: Uses RW and SPC, sometimes forgets. Has had falls. ADLs Comments: Reports needed help with bath and dress.     Hand Dominance   Dominant Hand: Right    Extremity/Trunk Assessment   Upper Extremity Assessment Upper Extremity Assessment: Defer to OT evaluation    Lower Extremity Assessment Lower Extremity Assessment: RLE deficits/detail RLE Deficits / Details: Rt foot bandaged. RLE: Unable to fully assess due to immobilization RLE Sensation: history of peripheral neuropathy       Communication   Communication: No difficulties  Cognition Arousal/Alertness: Awake/alert Behavior During Therapy: WFL for tasks assessed/performed Overall Cognitive Status: History of cognitive impairments - at baseline                                          General Comments General comments (skin integrity, edema, etc.): After sitting up on EOB with therapist, pt became pale and unresponsive, eyes rolled upwards. Quickly returned to supine and regained consciousness. Episode <10 seconds. BP checked in supine 197/71; denies symptoms. RN in room assessing pt. further PT assessment terminated at this time.    Exercises     Assessment/Plan    PT Assessment Patient needs continued PT services  PT Problem List Decreased strength;Decreased range of motion;Decreased activity tolerance;Decreased mobility;Decreased balance;Decreased cognition;Decreased knowledge of use of DME;Decreased safety awareness;Decreased knowledge of  precautions;Cardiopulmonary status limiting activity;Impaired sensation;Pain       PT Treatment Interventions DME instruction;Gait training;Functional mobility training;Therapeutic activities;Therapeutic exercise;Neuromuscular re-education;Balance training;Cognitive remediation;Patient/family education;Wheelchair mobility training;Modalities    PT Goals (Current goals can be found in the Care Plan section)  Acute Rehab PT Goals Patient Stated Goal: none PT Goal Formulation: With patient/family Time For Goal Achievement: 09/10/22 Potential to Achieve Goals: Good    Frequency Min 3X/week     Co-evaluation               AM-PAC PT "6 Clicks" Mobility  Outcome Measure Help needed turning from your back to your side while in a flat bed without using bedrails?: A Little Help needed moving from lying on your back to sitting on the side of a flat bed without using bedrails?: A Little Help needed moving to and from a bed to a chair (including a wheelchair)?: A Lot Help needed standing up from a chair using your arms (e.g., wheelchair or bedside chair)?: A Lot Help needed to walk in hospital room?: Total Help needed climbing 3-5 steps with a railing? : Total 6 Click Score: 12    End of Session   Activity Tolerance: Treatment limited secondary to medical complications (Comment) (syncope) Patient left: in bed;with call bell/phone within reach;with bed alarm set;with nursing/sitter in room;with family/visitor present Nurse Communication: Mobility status (syncopal episode)  PT Visit Diagnosis: Muscle weakness (generalized) (M62.81);History of falling (Z91.81);Difficulty in walking, not elsewhere classified (R26.2);Repeated falls (R29.6)    Time: 1610-9604 PT Time Calculation (min) (ACUTE ONLY): 30 min   Charges:   PT Evaluation $PT Eval Moderate Complexity: 1 Mod PT Treatments $Therapeutic Activity: 8-22 mins        Kathlyn Sacramento, PT, DPT Physical Therapist Acute  Rehabilitation Services [facility] [phone]   Berton Mount 08/27/2022, 11:12 AM

## 2022-08-28 ENCOUNTER — Encounter (HOSPITAL_COMMUNITY): Payer: Self-pay | Admitting: Podiatry

## 2022-08-28 DIAGNOSIS — E1151 Type 2 diabetes mellitus with diabetic peripheral angiopathy without gangrene: Secondary | ICD-10-CM | POA: Diagnosis not present

## 2022-08-28 DIAGNOSIS — M869 Osteomyelitis, unspecified: Secondary | ICD-10-CM | POA: Diagnosis not present

## 2022-08-28 DIAGNOSIS — N1831 Chronic kidney disease, stage 3a: Secondary | ICD-10-CM | POA: Diagnosis not present

## 2022-08-28 DIAGNOSIS — I739 Peripheral vascular disease, unspecified: Secondary | ICD-10-CM | POA: Diagnosis not present

## 2022-08-28 LAB — CBC WITH DIFFERENTIAL/PLATELET
Abs Immature Granulocytes: 0.02 10*3/uL (ref 0.00–0.07)
Basophils Absolute: 0 10*3/uL (ref 0.0–0.1)
Basophils Relative: 1 %
Eosinophils Absolute: 0.3 10*3/uL (ref 0.0–0.5)
Eosinophils Relative: 4 %
HCT: 36.6 % — ABNORMAL LOW (ref 39.0–52.0)
Hemoglobin: 11.9 g/dL — ABNORMAL LOW (ref 13.0–17.0)
Immature Granulocytes: 0 %
Lymphocytes Relative: 20 %
Lymphs Abs: 1.6 10*3/uL (ref 0.7–4.0)
MCH: 27.4 pg (ref 26.0–34.0)
MCHC: 32.5 g/dL (ref 30.0–36.0)
MCV: 84.3 fL (ref 80.0–100.0)
Monocytes Absolute: 0.8 10*3/uL (ref 0.1–1.0)
Monocytes Relative: 10 %
Neutro Abs: 5.1 10*3/uL (ref 1.7–7.7)
Neutrophils Relative %: 65 %
Platelets: 178 10*3/uL (ref 150–400)
RBC: 4.34 MIL/uL (ref 4.22–5.81)
RDW: 13.6 % (ref 11.5–15.5)
WBC: 7.7 10*3/uL (ref 4.0–10.5)
nRBC: 0 % (ref 0.0–0.2)

## 2022-08-28 LAB — COMPREHENSIVE METABOLIC PANEL
ALT: 9 U/L (ref 0–44)
AST: 12 U/L — ABNORMAL LOW (ref 15–41)
Albumin: 3 g/dL — ABNORMAL LOW (ref 3.5–5.0)
Alkaline Phosphatase: 64 U/L (ref 38–126)
Anion gap: 9 (ref 5–15)
BUN: 33 mg/dL — ABNORMAL HIGH (ref 8–23)
CO2: 24 mmol/L (ref 22–32)
Calcium: 8.2 mg/dL — ABNORMAL LOW (ref 8.9–10.3)
Chloride: 103 mmol/L (ref 98–111)
Creatinine, Ser: 1.68 mg/dL — ABNORMAL HIGH (ref 0.61–1.24)
GFR, Estimated: 40 mL/min — ABNORMAL LOW (ref >60)
Glucose, Bld: 166 mg/dL — ABNORMAL HIGH (ref 70–99)
Potassium: 3.5 mmol/L (ref 3.5–5.1)
Sodium: 136 mmol/L (ref 135–145)
Total Bilirubin: 1 mg/dL (ref 0.3–1.2)
Total Protein: 6.2 g/dL — ABNORMAL LOW (ref 6.5–8.1)

## 2022-08-28 LAB — AEROBIC/ANAEROBIC CULTURE W GRAM STAIN (SURGICAL/DEEP WOUND)

## 2022-08-28 LAB — GLUCOSE, CAPILLARY
Glucose-Capillary: 156 mg/dL — ABNORMAL HIGH (ref 70–99)
Glucose-Capillary: 204 mg/dL — ABNORMAL HIGH (ref 70–99)
Glucose-Capillary: 131 mg/dL — ABNORMAL HIGH (ref 70–99)
Glucose-Capillary: 155 mg/dL — ABNORMAL HIGH (ref 70–99)

## 2022-08-28 LAB — PHOSPHORUS: Phosphorus: 3.7 mg/dL (ref 2.5–4.6)

## 2022-08-28 LAB — MAGNESIUM: Magnesium: 2.5 mg/dL — ABNORMAL HIGH (ref 1.7–2.4)

## 2022-08-28 MED ORDER — SODIUM CHLORIDE 0.9 % IV SOLN: INTRAVENOUS | Status: AC

## 2022-08-28 MED ORDER — CHLORHEXIDINE GLUCONATE CLOTH 2 % EX PADS
6.0000 | MEDICATED_PAD | Freq: Every day | CUTANEOUS | Status: DC
  Administered 2022-08-28 – 2022-09-01 (×4): 6 via TOPICAL

## 2022-08-28 NOTE — Progress Notes (Signed)
Brief ID note:  Patient underwent tunneled right IJ CVC placement yesterday with IR.  No other acute events noted.  Tmax 98.4.  Normal WBC this morning, creatinine slightly improved at 1.68.  No new imaging.  No growth in cultures from TMA site on 08/26/2022 and pathology still pending.  No changes from yesterday's plan at this time.  Continue Zosyn.  Dr. Drue Second or Dr. Daiva Eves will assume care on Monday.    Vedia Coffer for Infectious Disease [facility] 08/28/2022, 7:45 AM

## 2022-08-28 NOTE — Progress Notes (Signed)
Physical Therapy Treatment Patient Details Name: Wayne [name] MRN: [id number] DOB: Feb 26, 1941 Today's Date: 08/28/2022   History of Present Illness 82 y.o. male admitted 5/28 with Rt foot osteomyelitis. 5/30 s/p Tendo Achilles lengthening right and Transmetatarsal amputation right.   PM Hof type 2 diabetes, dyslipidemia, peripheral artery disease.    PT Comments    Pt received in supine and agreeable to session. Pt able to perform all bed mobility with min guard and cues due to some difficulty coordinating and sequencing. Pt able to perform seated exercises with good strength, but limited R knee extension. Pt's BP noted to drop as low as 87/49 after sitting EOB for ~8 mins and performing exercises with pt reporting no symptoms. Pt returned to supine and BP improving to 132/56. Pt left with bed in chair position to increase sitting tolerance, RN notified. Pt continues to benefit from PT services to progress toward functional mobility goals.     Recommendations for follow up therapy are one component of a multi-disciplinary discharge planning process, led by the attending physician.  Recommendations may be updated based on patient status, additional functional criteria and insurance authorization.     Assistance Recommended at Discharge Frequent or constant Supervision/Assistance  Patient can return home with the following Two people to help with walking and/or transfers;A lot of help with bathing/dressing/bathroom;Assistance with cooking/housework;Direct supervision/assist for medications management;Direct supervision/assist for financial management;Assist for transportation   Equipment Recommendations   (TBD next venue)    Recommendations for Other Services       Precautions / Restrictions Precautions Precautions: Fall;Other (comment) Precaution Comments: watch BP Restrictions Weight Bearing Restrictions: Yes RLE Weight Bearing: Non weight bearing     Mobility  Bed  Mobility Overal bed mobility: Needs Assistance Bed Mobility: Supine to Sit, Sit to Supine     Supine to sit: Min guard Sit to supine: Min guard   General bed mobility comments: Assist with lines and cues for technique, but no physical assist needed. Pt demonstrated difficulty scooting towards HOB in supine, but able to do so with cues for technique and min A to maintain RLE NWB       Balance Overall balance assessment: Needs assistance Sitting-balance support: Single extremity supported Sitting balance-Leahy Scale: Good Sitting balance - Comments: Sitting EOB performing exercises       Standing balance comment: deferred                            Cognition Arousal/Alertness: Awake/alert Behavior During Therapy: WFL for tasks assessed/performed Overall Cognitive Status: History of cognitive impairments - at baseline                                 General Comments: Pt able to follow commands with increased difficulty coordinating LLE with bed mobility. Pt does not remember RLE WB precautions without cues        Exercises General Exercises - Lower Extremity Long Arc Quad: AROM, Seated, Both, 10 reps Hip Flexion/Marching: AROM, Seated, Both, 10 reps    General Comments General comments (skin integrity, edema, etc.): BP in supine 124/53; BP sitting EOB: 97/45, 96/44, 87/49; BP when returned to supine 132/56; RN notified. Pt reporting no dizziness throughout session      Pertinent Vitals/Pain Pain Assessment Pain Assessment: No/denies pain     PT Goals (current goals can now be found in the care plan section) Acute Rehab  PT Goals Patient Stated Goal: none PT Goal Formulation: With patient/family Time For Goal Achievement: 09/10/22 Potential to Achieve Goals: Good Progress towards PT goals: Progressing toward goals    Frequency           PT Plan Current plan remains appropriate       AM-PAC PT "6 Clicks" Mobility   Outcome  Measure  Help needed turning from your back to your side while in a flat bed without using bedrails?: A Little Help needed moving from lying on your back to sitting on the side of a flat bed without using bedrails?: A Little Help needed moving to and from a bed to a chair (including a wheelchair)?: A Lot Help needed standing up from a chair using your arms (e.g., wheelchair or bedside chair)?: A Lot Help needed to walk in hospital room?: Total Help needed climbing 3-5 steps with a railing? : Total 6 Click Score: 12    End of Session   Activity Tolerance: Treatment limited secondary to medical complications (Comment) (BP drop) Patient left: in bed;with call bell/phone within reach;with bed alarm set Nurse Communication: Mobility status PT Visit Diagnosis: Muscle weakness (generalized) (M62.81);History of falling (Z91.81);Difficulty in walking, not elsewhere classified (R26.2);Repeated falls (R29.6)     Time: 1000-1024 PT Time Calculation (min) (ACUTE ONLY): 24 min  Charges:  $Therapeutic Exercise: 8-22 mins $Therapeutic Activity: 8-22 mins                     Johny Shock, PTA Acute Rehabilitation Services Secure Chat Preferred  [phone]    Johny Shock 08/28/2022, 10:36 AM

## 2022-08-28 NOTE — Progress Notes (Signed)
PROGRESS NOTE    Wayne [name]  ZOX:[id number] DOB: Mar 23, 1941 DOA: 08/24/2022 PCP: Sonny Masters, FNP   Brief Narrative:  he patient is an 82 year old Caucasian male with a past medical history significant for but limited to diabetes mellitus type 2, neuropathy, PAD, hypertension, hyperlipidemia, CKD stage IIIa, subdural hematoma, chronic diastolic CHF, CAD as well as other comorbidities who presented with right foot osteomyelitis and pain.  He has been having ongoing right foot pain and he had a previous right fifth metatarsal amputation and has been following with wound care, infectious disease and podiatry in outpatient setting.  He been on 8-week course of Augmentin and ciprofloxacin and follow-up with his podiatrist yesterday and there is concern for osteomyelitis which was confirmed on the x-ray in the office and there is also new fracture noted.  He was sent to the ED for IV antibiotics and admission for surgical intervention.  In the ED he was noted to have elevated blood pressures and heart rate in the 50s.  Lab workup was revealing BUN/creatinine of 35/1.35.  Blood cultures obtained and right foot x-rays were done in the podiatry office were not currently visible or reported.    Podiatry recommended MRI and antibiotics and he was started on IV Zosyn.  ID has been consulted and they are recommending continue IV Zosyn pending further surgical intervention and patient underwent TMA of his right foot on 08/26/2022.  He is postoperative day 2 and remains a little orthostatic     Assessment and Plan: No notes have been filed under this hospital service. Service: Hospitalist   Osteomyelitis of right fourth metatarsal status post a tendo Achilles lengthening on the right, transmetatarsal amputation on the right and a bone biopsy of the fourth metatarsal in the right POD2 -History of prior foot infection status amputation of right fifth metatarsal. -Has had ongoing foot pain and was at  podiatry's office today for debridement/follow-up.  Concern for right fourth metatarsal changes. -X-ray in the office reportedly confirmed fracture and osteomyelitis.  Patient sent to the ED for IV antibiotics and possible surgical intervention. -MRI done and showed " Interval amputation of the 5th ray through the base of the 5th metatarsal. 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 Probable early osteomyelitis involving the head of the 3rd metatarsal, the proximal 4th phalanx and distal half of the remaining 5th metatarsal. Soft tissue ulceration lateral to the 4th metatarsal with surrounding inflammation. No focal fluid collection. Atrophy and T2 hyperintensity throughout the forefoot musculature." -Patient started on Zosyn in the ED, podiatry was consulted and patient was taken to the OR on 08/26/2022 and had a bone biopsy of the fourth metatarsal as well as a transmetatarsal amputation on the right and tendo Achilles lengthening; per Dr. Logan Bores he does not suspect clean osseous margins postoperatively and will need to continue IV antibiotics -Continue to monitor on telemetry overnight -Appreciate podiatry recommendations and assistance -Continue with IV Zosyn and given his poor chance of healing we have consulted infectious diseases for further antibiotic management -ID consulted and recommending continuing IV Zosyn pending further surgical evaluation and they believe that the patient exhausted all his efforts at limb salvage progression with osteomyelitis suspected prolonged antibiotic therapy and attempt at limb salvage and 1 amputation November 2024.  They feel that he needs a more definitive surgery at this time and recommending having the patient consider a BKA but patient is undergoing a TMA for further attempted  foot salvage and prevention of BKA; unfortunately patient did not have clean margins but ID is now recommending  continue antibiotics for 6 weeks and recommending IV Zosyn and following of the cultures and pathology.  They recommend IV antibiotics and they feel he has failed oral antibiotics.  Given his renal function they are requesting a central Cathter for Prolonged IV Access so have asked IR to Place to try and preserve arm veins in case of future hemodialysis/fistula/graft placement requirement -Trend fever curve and WBC -Follow-up blood cultures and intraoperative cultures and ID feels that the duration of postoperative Abx will be at least 6 weeks given that margins were suspected not to be clear.  He now has a IJ central PICC -Now has an incisional wound VAC in place with no complications and dressings are left in place and podiatry recommends leaving the incisional wound VAC in place until the patient is ready for discharge to rehab facility; the plan will then to be to discharge the patient to rehab and place orders for wound care as he is will be nonweightbearing on the surgical extremity   PAD -Known history of PAD.  Did have angiogram in August 2023 with runoff.   -This was notable for right pedal circulation provided only by posterior tibial artery.  This is relevant to healing in the setting of osteomyelitis above. -Had previously been on aspirin and do not see this on patient's medication list currently -Saw Dr. [name] of Vascular surgery and has examined been worsened last September.  His angiography showed no options for reconstruction and he did have an inflow to the ankle via the peroneal only runoff which had robust arborization in the posterior tibial artery.  Fortunately there is no improvement chances is healing from a vascular standpoint at that time.   Diabetes Mellitus Type 2 -Currently holding glipizide 5 mg p.o. daily before breakfast and Dapagliflozin 10 mg po Daily -Most recent HbA1c was 5.9 -Continue Sensitive Novolog SSI AC -Continue to Monitor CBG's per Protocol; CBG  Trend: Recent Labs  Lab 08/27/22 0727 08/27/22 1115 08/27/22 1651 08/27/22 2058 08/28/22 0727 08/28/22 1104 08/28/22 1624  GLUCAP 156* 153* 276* 178* 156* 204* 131*    Hypertension -> Hypotension and Orthostatic VS -Continued home Furosemide 40 mg po Daily and Metoprolol Succinate 12.5 mg po Daily we will now discontinue furosemide and discontinue the metoprolol -Continue to Monitor BP per Protocol -Last BP Reading was 159/63 but he was orthostatic upon working with physical therapy; see below -Adjust BP Meds as necessary   Hyperlipidemia -Do not currently see patient on any medications for this on MAR   Chronic Diastolic CHF  -Echo earlier this year with EF 50-55%, G2 DD, normal RV function. -Continue home Furosemide 40 mg po Daily and Metoprolol Succinate 12.5 mg po Daily; Currently holding Dapaglifozin 10 mg po Daily  -Strict I's and O's and Daily Weights.  Intake/Output Summary (Last 24 hours) at 08/28/2022 1722 Last data filed at 08/28/2022 1505 Gross per 24 hour  Intake 2128.21 ml  Output 1200 ml  Net 928.21 ml  -Will start Gentle IVF Hydration given Orthostasis -Continue to Monitor for S/Sx of Volume Overload  Orthostatic Hypotension -BP dropped and patient had a near syncopal episode today; today's blood pressure dropped as low as 87/49 after sitting at the edge of the bed for almost 8 minutes and perform exercises but he had no symptoms.  When his return to supine his blood pressure improved to 132/56 -Hold Lasix -Started gentle  IVF Hydration and will continue NS at 75 mL/hr x 1 day; Order TED Hose (on the Left) and Abdominal Binder -Repeat Orthostatic VS in the AM -Continue PT/OT to Evaluate and Treat -See blood pressure reading as above   CAD -Chart review shows history of angioplasty. -Recently seen Dr. Antoine Poche with cardiology and he had an echocardiogram in February of this year which showed an EF of 50 to 55% -Holding both Metoprolol Succinate 12.5 mg po Daily  and holding Lasix now  Normocytic Anemia -Likely dilutional and Post-Operative Drop -Hgb/Hct Trend: Recent Labs  Lab 08/19/22 1432 08/24/22 1105 08/24/22 1105 08/25/22 0421 08/26/22 0226 08/27/22 1052 08/28/22 0127  HGB 13.2 14.9  --  15.1 14.3 13.3 11.9*  HCT 39.3 47.3   < > 45.7 42.7 41.1 36.6*  MCV 83 86.0   < > 85.3 82.3 85.1 84.3   < > = values in this interval not displayed.  -Check Anemia Panel in the AM -Continue to Monitor for S/Sx of Bleeding; No overt bleeding noted -Repeat CBC in the AM    AKI on CKD Stage 3a -BUN/Cr Trend: Recent Labs  Lab 08/19/22 1432 08/24/22 1105 08/25/22 0421 08/26/22 0226 08/27/22 1052 08/28/22 0127  BUN 43* 35* 31* 35* 32* 33*  CREATININE 1.48* 1.53* 1.52* 1.83* 1.80* 1.68*  -Avoid Nephrotoxic Medications, Contrast Dyes, Hypotension and Dehydration to Ensure Adequate Renal Perfusion and will need to Renally Adjust Meds -Was given IV Lasix and now changed back to oral Lasix but will hold due to Orthostatic Hypotension -Continue to Monitor and Trend Renal Function carefully and repeat CMP in the AM    Hypoalbuminemia -Patient's Albumin Trend: Recent Labs  Lab 08/19/22 1432 08/24/22 1105 08/25/22 0421 08/26/22 0226 08/27/22 1052 08/28/22 0127  ALBUMIN 4.0 3.7 3.3* 3.4* 3.3* 3.0*  -Continue to Monitor and Trend and repeat CMP in the AM  Severe malnutrition in the context of chronic illness Nutrition Status: Nutrition Problem: Severe Malnutrition Etiology: chronic illness Signs/Symptoms: severe fat depletion, severe muscle depletion   -Nutrition recently been consulted and recommending Nepro shakes p.o. twice daily as well as Prosource +30 mL p.o. twice daily as well as a multivitamin p.o. daily, vitamin C 500 mg p.o. twice daily, zinc to 20 mg p.o. daily for 14 days  -The dietitian feels the patient is a high refeeding risk and recommending monitoring potassium magnesium and phosphorus labs daily until stable and also when the  patient daily  Tinea Cruris -Diflucan Weekly Treatment (On Thursdays) and Nystatin Cream   DVT prophylaxis: Place TED hose Start: 08/27/22 1532 Place TED hose Start: 08/27/22 1422 SCDs Start: 08/26/22 2051 SCDs Start: 08/24/22 1253    Code Status: DNR Family Communication: No family present at bedside  Disposition Plan:  Level of care: Telemetry Surgical Status is: Inpatient Remains inpatient appropriate because: Needs SNF and will need to ensure he is not Orthostatic Prior to D/C   Consultants:  Podiatry Infectious Diseases  IR  Procedures:  As delineated as above   Procedure by Dr. Logan Bores:  1.  Tendo Achilles lengthening right 2.  Transmetatarsal amputation right 3.  Bone biopsy fourth metatarsal right  Antimicrobials:  Anti-infectives (From admission, onward)    Start     Dose/Rate Route Frequency Ordered Stop   08/26/22 1400  fluconazole (DIFLUCAN) tablet 150 mg        150 mg Oral Weekly 08/26/22 1236 09/23/22 1359   08/24/22 2200  piperacillin-tazobactam (ZOSYN) IVPB 3.375 g  3.375 g 12.5 mL/hr over 240 Minutes Intravenous Every 8 hours 08/24/22 1248     08/24/22 1300  piperacillin-tazobactam (ZOSYN) IVPB 3.375 g        3.375 g 100 mL/hr over 30 Minutes Intravenous  Once 08/24/22 1248 08/24/22 1403       Subjective: Seen and examined at bedside and he was resting and felt okay.  Had some nutrition bars at bedside that he is eating.  Denies any pain.  States that he felt okay.  No nausea or vomiting.  No other concerns or complaints at this time.  Objective: Vitals:   08/27/22 1646 08/27/22 1954 08/28/22 0430 08/28/22 0735  BP: (!) 148/61 123/60 119/85 (!) 159/63  Pulse: 79 64 73 70  Resp: 17 17 20    Temp: 98 F (36.7 C) 98.4 F (36.9 C) 97.9 F (36.6 C) 97.8 F (36.6 C)  TempSrc: Oral Oral  Oral  SpO2: 93% 95% 95% 96%  Weight:      Height:        Intake/Output Summary (Last 24 hours) at 08/28/2022 1733 Last data filed at 08/28/2022 1505 Gross  per 24 hour  Intake 2128.21 ml  Output 1200 ml  Net 928.21 ml   Filed Weights   08/24/22 1053 08/25/22 0500  Weight: 78.9 kg 76.7 kg   Examination: Physical Exam:  Constitutional: Elderly chronically ill-appearing Caucasian male in no acute distress Respiratory: Diminished to auscultation bilaterally, no wheezing, rales, rhonchi or crackles. Normal respiratory effort and patient is not tachypenic. No accessory muscle use.  Unlabored breathing and not wearing supplemental oxygen via nasal cannula Cardiovascular: RRR, no murmurs / rubs / gallops. S1 and S2 auscultated. No extremity edema on left.  Abdomen: Soft, non-tender, non-distended. Bowel sounds positive.  GU: Deferred. Musculoskeletal: No clubbing / cyanosis of digits/nails.  Has a right TMA now and also has a central IJ PICC line that is tunneled in place.  Skin: No rashes, lesions, ulcers on limited skin evaluation. No induration; Warm and dry.  Neurologic: CN 2-12 grossly intact with no focal deficits. Romberg sign and cerebellar reflexes not assessed.  Psychiatric: Continues to have slightly impaired judgment and insight but he is much more awake and alert today  Data Reviewed: I have personally reviewed following labs and imaging studies  CBC: Recent Labs  Lab 08/24/22 1105 08/25/22 0421 08/26/22 0226 08/27/22 1052 08/28/22 0127  WBC 5.5 6.3 7.3 10.0 7.7  NEUTROABS 3.2  --  4.1 6.1 5.1  HGB 14.9 15.1 14.3 13.3 11.9*  HCT 47.3 45.7 42.7 41.1 36.6*  MCV 86.0 85.3 82.3 85.1 84.3  PLT 205 210 209 204 178   Basic Metabolic Panel: Recent Labs  Lab 08/24/22 1105 08/25/22 0421 08/26/22 0226 08/27/22 1052 08/28/22 0127  NA 135 138 140 134* 136  K 4.4 4.6 3.8 3.7 3.5  CL 104 104 104 102 103  CO2 22 26 24 23 24   GLUCOSE 150* 115* 116* 158* 166*  BUN 35* 31* 35* 32* 33*  CREATININE 1.53* 1.52* 1.83* 1.80* 1.68*  CALCIUM 9.1 9.1 8.9 8.5* 8.2*  MG  --   --  2.7* 2.8* 2.5*  PHOS  --   --  4.2 3.9 3.7    GFR: Estimated Creatinine Clearance: 36.1 mL/min (A) (by C-G formula based on SCr of 1.68 mg/dL (H)). Liver Function Tests: Recent Labs  Lab 08/24/22 1105 08/25/22 0421 08/26/22 0226 08/27/22 1052 08/28/22 0127  AST 14* 13* 13* 15 12*  ALT 13 10 12 11  9  ALKPHOS 99 91 81 73 64  BILITOT 0.8 1.1 1.2 1.3* 1.0  PROT 8.0 7.1 7.1 6.7 6.2*  ALBUMIN 3.7 3.3* 3.4* 3.3* 3.0*   No results for input(s): "LIPASE", "AMYLASE" in the last 168 hours. No results for input(s): "AMMONIA" in the last 168 hours. Coagulation Profile: No results for input(s): "INR", "PROTIME" in the last 168 hours. Cardiac Enzymes: No results for input(s): "CKTOTAL", "CKMB", "CKMBINDEX", "TROPONINI" in the last 168 hours. BNP (last 3 results) No results for input(s): "PROBNP" in the last 8760 hours. HbA1C: No results for input(s): "HGBA1C" in the last 72 hours. CBG: Recent Labs  Lab 08/27/22 1651 08/27/22 2058 08/28/22 0727 08/28/22 1104 08/28/22 1624  GLUCAP 276* 178* 156* 204* 131*   Lipid Profile: No results for input(s): "CHOL", "HDL", "LDLCALC", "TRIG", "CHOLHDL", "LDLDIRECT" in the last 72 hours. Thyroid Function Tests: No results for input(s): "TSH", "T4TOTAL", "FREET4", "T3FREE", "THYROIDAB" in the last 72 hours. Anemia Panel: No results for input(s): "VITAMINB12", "FOLATE", "FERRITIN", "TIBC", "IRON", "RETICCTPCT" in the last 72 hours. Sepsis Labs: Recent Labs  Lab 08/24/22 1105  LATICACIDVEN 1.1    Recent Results (from the past 240 hour(s))  Blood Cultures x 2 sites     Status: None (Preliminary result)   Collection Time: 08/24/22 10:56 AM   Specimen: BLOOD RIGHT FOREARM  Result Value Ref Range Status   Specimen Description BLOOD RIGHT FOREARM  Final   Special Requests   Final    BOTTLES DRAWN AEROBIC AND ANAEROBIC Blood Culture adequate volume   Culture   Final    NO GROWTH 4 DAYS Performed at [facility], [address], [place], [place] [address]    Report Status PENDING   Incomplete  Blood Cultures x 2 sites     Status: None (Preliminary result)   Collection Time: 08/24/22  3:54 PM   Specimen: BLOOD  Result Value Ref Range Status   Specimen Description BLOOD RIGHT ANTECUBITAL  Final   Special Requests   Final    BOTTLES DRAWN AEROBIC AND ANAEROBIC Blood Culture adequate volume   Culture   Final    NO GROWTH 4 DAYS Performed at [facility], [address], [place], [place] [address]    Report Status PENDING  Incomplete  Aerobic/Anaerobic Culture w Gram Stain (surgical/deep wound)     Status: None (Preliminary result)   Collection Time: 08/26/22  6:23 PM   Specimen: Soft Tissue, Other  Result Value Ref Range Status   Specimen Description TISSUE  Final   Special Requests RT 4TH METATARSAL  Final   Gram Stain NO WBC SEEN NO ORGANISMS SEEN   Final   Culture   Final    NO GROWTH 2 DAYS Performed at [facility], [address], [place], [place] [address]    Report Status PENDING  Incomplete    Radiology Studies: IR Fluoro Guide CV Line Right  Result Date: 08/27/2022 INDICATION: Osteomyelitis, access for long-term IV antibiotics EXAM: TUNNELED PICC LINE WITH ULTRASOUND AND FLUOROSCOPIC GUIDANCE MEDICATIONS: 1% lidocaine local. ANESTHESIA/SEDATION: None. FLUOROSCOPY: Radiation Exposure Index (as provided by the fluoroscopic device): 1.0 mGy Kerma COMPLICATIONS: None immediate. PROCEDURE: Informed written consent was obtained from the patient after a discussion of the risks, benefits, and alternatives to treatment. Questions regarding the procedure were encouraged and answered. The right neck and chest were prepped with chlorhexidine in a sterile fashion, and a sterile drape was applied covering the operative field. Maximum barrier sterile technique with sterile gowns and  gloves were used for the procedure. A timeout was performed prior to the initiation of the procedure. After creating a small venotomy incision, a micropuncture kit was utilized to  access the right internal jugular vein under direct, real-time ultrasound guidance after the overlying soft tissues were anesthetized with 1% lidocaine with epinephrine. Ultrasound image documentation was performed. The microwire was kinked to measure appropriate catheter length. The micropuncture sheath was exchanged for a peel-away sheath over a guidewire. A 5 French dual lumen tunneled PICC measuring 29 cm was tunneled in a retrograde fashion from the anterior chest wall to the venotomy incision. The catheter was then placed through the peel-away sheath with tip ultimately positioned at the superior caval-atrial junction. Final catheter positioning was confirmed and documented with a spot radiographic image. The catheter aspirates and flushes normally. The catheter was flushed with appropriate volume heparin dwells. The catheter exit site was secured with a 2-0 Ethilon retention suture. The venotomy incision was closed with Dermabond. Dressings were applied. The patient tolerated the procedure well without immediate post procedural complication. FINDINGS: After catheter placement, the tip lies within the superior cavoatrial junction. The catheter aspirates and flushes normally and is ready for immediate use. IMPRESSION: Successful placement of 29cm dual lumen tunneled PICC catheter via the right internal jugular vein with tip terminating at the superior caval atrial junction. The catheter is ready for immediate use. Electronically Signed   By: Judie Petit.  Shick M.D.   On: 08/27/2022 17:11   IR US Guide Vasc Access Right  Result Date: 08/27/2022 INDICATION: Osteomyelitis, access for long-term IV antibiotics EXAM: TUNNELED PICC LINE WITH ULTRASOUND AND FLUOROSCOPIC GUIDANCE MEDICATIONS: 1% lidocaine local. ANESTHESIA/SEDATION: None. FLUOROSCOPY: Radiation Exposure Index (as provided by the fluoroscopic device): 1.0 mGy Kerma COMPLICATIONS: None immediate. PROCEDURE: Informed written consent was obtained from the  patient after a discussion of the risks, benefits, and alternatives to treatment. Questions regarding the procedure were encouraged and answered. The right neck and chest were prepped with chlorhexidine in a sterile fashion, and a sterile drape was applied covering the operative field. Maximum barrier sterile technique with sterile gowns and gloves were used for the procedure. A timeout was performed prior to the initiation of the procedure. After creating a small venotomy incision, a micropuncture kit was utilized to access the right internal jugular vein under direct, real-time ultrasound guidance after the overlying soft tissues were anesthetized with 1% lidocaine with epinephrine. Ultrasound image documentation was performed. The microwire was kinked to measure appropriate catheter length. The micropuncture sheath was exchanged for a peel-away sheath over a guidewire. A 5 French dual lumen tunneled PICC measuring 29 cm was tunneled in a retrograde fashion from the anterior chest wall to the venotomy incision. The catheter was then placed through the peel-away sheath with tip ultimately positioned at the superior caval-atrial junction. Final catheter positioning was confirmed and documented with a spot radiographic image. The catheter aspirates and flushes normally. The catheter was flushed with appropriate volume heparin dwells. The catheter exit site was secured with a 2-0 Ethilon retention suture. The venotomy incision was closed with Dermabond. Dressings were applied. The patient tolerated the procedure well without immediate post procedural complication. FINDINGS: After catheter placement, the tip lies within the superior cavoatrial junction. The catheter aspirates and flushes normally and is ready for immediate use. IMPRESSION: Successful placement of 29cm dual lumen tunneled PICC catheter via the right internal jugular vein with tip terminating at the superior caval atrial junction. The catheter is ready  for immediate use.  Electronically Signed   By: Judie Petit.  Shick M.D.   On: 08/27/2022 17:11   DG Foot Complete Right  Result Date: 08/26/2022 CLINICAL DATA:  Status post transmetatarsal amputation EXAM: RIGHT FOOT COMPLETE - 3+ VIEW COMPARISON:  08/24/2018 FINDINGS: There is been interval transmetatarsal amputation. Multiple surgical staples are seen. No acute bony abnormality is noted. Calcaneal spurring is seen. Wound VAC is noted laterally. 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 Electronically Signed   By: Alcide Clever M.D.   On: 08/26/2022 21:10    Scheduled Meds:  (feeding supplement) PROSource Plus  30 mL Oral BID BM   ascorbic acid  500 mg Oral BID   feeding supplement (NEPRO CARB STEADY)  237 mL Oral BID BM   fluconazole  150 mg Oral Weekly   insulin aspart  0-9 Units Subcutaneous TID WC   metoprolol succinate  12.5 mg Oral Daily   multivitamin with minerals  1 tablet Oral Daily   nystatin cream   Topical BID   sodium chloride flush  3 mL Intravenous Q12H   zinc sulfate  220 mg Oral Daily   Continuous Infusions:  sodium chloride 75 mL/hr at 08/28/22 1730   piperacillin-tazobactam (ZOSYN)  IV 3.375 g (08/28/22 1505)    LOS: 3 days   Merlene Laughter, DO [facility] Available via Epic secure chat 7am-7pm After these hours, please refer to coverage provider listed on [email or web address] 08/28/2022, 5:33 PM

## 2022-08-29 DIAGNOSIS — E1151 Type 2 diabetes mellitus with diabetic peripheral angiopathy without gangrene: Secondary | ICD-10-CM | POA: Diagnosis not present

## 2022-08-29 DIAGNOSIS — N1831 Chronic kidney disease, stage 3a: Secondary | ICD-10-CM | POA: Diagnosis not present

## 2022-08-29 DIAGNOSIS — I739 Peripheral vascular disease, unspecified: Secondary | ICD-10-CM | POA: Diagnosis not present

## 2022-08-29 DIAGNOSIS — M869 Osteomyelitis, unspecified: Secondary | ICD-10-CM | POA: Diagnosis not present

## 2022-08-29 LAB — COMPREHENSIVE METABOLIC PANEL
ALT: 10 U/L (ref 0–44)
AST: 12 U/L — ABNORMAL LOW (ref 15–41)
Albumin: 2.9 g/dL — ABNORMAL LOW (ref 3.5–5.0)
Alkaline Phosphatase: 65 U/L (ref 38–126)
Anion gap: 9 (ref 5–15)
BUN: 22 mg/dL (ref 8–23)
CO2: 27 mmol/L (ref 22–32)
Calcium: 8.3 mg/dL — ABNORMAL LOW (ref 8.9–10.3)
Chloride: 105 mmol/L (ref 98–111)
Creatinine, Ser: 1.46 mg/dL — ABNORMAL HIGH (ref 0.61–1.24)
GFR, Estimated: 47 mL/min — ABNORMAL LOW (ref >60)
Glucose, Bld: 125 mg/dL — ABNORMAL HIGH (ref 70–99)
Potassium: 3.5 mmol/L (ref 3.5–5.1)
Sodium: 141 mmol/L (ref 135–145)
Total Bilirubin: 1.2 mg/dL (ref 0.3–1.2)
Total Protein: 6.2 g/dL — ABNORMAL LOW (ref 6.5–8.1)

## 2022-08-29 LAB — GLUCOSE, CAPILLARY
Glucose-Capillary: 142 mg/dL — ABNORMAL HIGH (ref 70–99)
Glucose-Capillary: 125 mg/dL — ABNORMAL HIGH (ref 70–99)
Glucose-Capillary: 151 mg/dL — ABNORMAL HIGH (ref 70–99)
Glucose-Capillary: 168 mg/dL — ABNORMAL HIGH (ref 70–99)

## 2022-08-29 LAB — CBC WITH DIFFERENTIAL/PLATELET
Abs Immature Granulocytes: 0.02 10*3/uL (ref 0.00–0.07)
Basophils Absolute: 0.1 10*3/uL (ref 0.0–0.1)
Basophils Relative: 1 %
Eosinophils Absolute: 0.3 10*3/uL (ref 0.0–0.5)
Eosinophils Relative: 4 %
HCT: 35.8 % — ABNORMAL LOW (ref 39.0–52.0)
Hemoglobin: 11.6 g/dL — ABNORMAL LOW (ref 13.0–17.0)
Immature Granulocytes: 0 %
Lymphocytes Relative: 25 %
Lymphs Abs: 1.9 10*3/uL (ref 0.7–4.0)
MCH: 27.4 pg (ref 26.0–34.0)
MCHC: 32.4 g/dL (ref 30.0–36.0)
MCV: 84.4 fL (ref 80.0–100.0)
Monocytes Absolute: 0.6 10*3/uL (ref 0.1–1.0)
Monocytes Relative: 9 %
Neutro Abs: 4.4 10*3/uL (ref 1.7–7.7)
Neutrophils Relative %: 61 %
Platelets: 163 10*3/uL (ref 150–400)
RBC: 4.24 MIL/uL (ref 4.22–5.81)
RDW: 13.8 % (ref 11.5–15.5)
WBC: 7.3 10*3/uL (ref 4.0–10.5)
nRBC: 0 % (ref 0.0–0.2)

## 2022-08-29 LAB — RETICULOCYTES
Immature Retic Fract: 10 % (ref 2.3–15.9)
RBC.: 4.13 MIL/uL — ABNORMAL LOW (ref 4.22–5.81)
Retic Count, Absolute: 56.2 10*3/uL (ref 19.0–186.0)
Retic Ct Pct: 1.4 % (ref 0.4–3.1)

## 2022-08-29 LAB — FOLATE: Folate: 11.9 ng/mL (ref >5.9)

## 2022-08-29 LAB — CULTURE, BLOOD (ROUTINE X 2)
Culture: NO GROWTH
Culture: NO GROWTH
Special Requests: ADEQUATE
Special Requests: ADEQUATE

## 2022-08-29 LAB — FERRITIN: Ferritin: 152 ng/mL (ref 24–336)

## 2022-08-29 LAB — AEROBIC/ANAEROBIC CULTURE W GRAM STAIN (SURGICAL/DEEP WOUND)

## 2022-08-29 LAB — IRON AND TIBC
Iron: 23 ug/dL — ABNORMAL LOW (ref 45–182)
Saturation Ratios: 12 % — ABNORMAL LOW (ref 17.9–39.5)
TIBC: 199 ug/dL — ABNORMAL LOW (ref 250–450)
UIBC: 176 ug/dL

## 2022-08-29 LAB — PHOSPHORUS: Phosphorus: 2.9 mg/dL (ref 2.5–4.6)

## 2022-08-29 LAB — MAGNESIUM: Magnesium: 2.4 mg/dL (ref 1.7–2.4)

## 2022-08-29 LAB — VITAMIN B12: Vitamin B-12: 273 pg/mL (ref 180–914)

## 2022-08-29 MED ORDER — SENNOSIDES-DOCUSATE SODIUM 8.6-50 MG PO TABS
1.0000 | ORAL_TABLET | Freq: Two times a day (BID) | ORAL | Status: DC
  Administered 2022-08-29 – 2022-09-01 (×7): 1 via ORAL
  Filled 2022-08-29 (×7): qty 1

## 2022-08-29 MED ORDER — POLYETHYLENE GLYCOL 3350 17 G PO PACK
17.0000 g | PACK | Freq: Two times a day (BID) | ORAL | Status: DC
  Administered 2022-08-29 – 2022-09-01 (×6): 17 g via ORAL
  Filled 2022-08-29 (×7): qty 1

## 2022-08-29 MED ORDER — BISACODYL 10 MG RE SUPP: 10.0000 mg | Freq: Every day | RECTAL | Status: DC | PRN

## 2022-08-29 NOTE — Progress Notes (Signed)
PROGRESS NOTE    Wayne [name]  ZOX:[id number] DOB: [birth year] DOA: 08/24/2022 PCP: Sonny Masters, FNP   Brief Narrative:  he patient is an 82 year old Caucasian male with a past medical history significant for but limited to diabetes mellitus type 2, neuropathy, PAD, hypertension, hyperlipidemia, CKD stage IIIa, subdural hematoma, chronic diastolic CHF, CAD as well as other comorbidities who presented with right foot osteomyelitis and pain.  He has been having ongoing right foot pain and he had a previous right fifth metatarsal amputation and has been following with wound care, infectious disease and podiatry in outpatient setting.  He been on 8-week course of Augmentin and ciprofloxacin and follow-up with his podiatrist yesterday and there is concern for osteomyelitis which was confirmed on the x-ray in the office and there is also new fracture noted.  He was sent to the ED for IV antibiotics and admission for surgical intervention.  In the ED he was noted to have elevated blood pressures and heart rate in the 50s.  Lab workup was revealing BUN/creatinine of 35/1.35.  Blood cultures obtained and right foot x-rays were done in the podiatry office were not currently visible or reported.    Podiatry recommended MRI and antibiotics and he was started on IV Zosyn.  ID has been consulted and they are recommending continue IV Zosyn pending further surgical intervention and patient underwent TMA of his right foot on 08/26/2022.  He is postoperative day 3 and remains a little orthostatic     Assessment and Plan: No notes have been filed under this hospital service. Service: Hospitalist   Osteomyelitis of right fourth metatarsal status post a tendo Achilles lengthening on the right, transmetatarsal amputation on the right and a bone biopsy of the fourth metatarsal in the right POD3 -History of prior foot infection status amputation of right fifth metatarsal. -Has had ongoing foot pain and was at  podiatry's office today for debridement/follow-up.  Concern for right fourth metatarsal changes. -X-ray in the office reportedly confirmed fracture and osteomyelitis.  Patient sent to the ED for IV antibiotics and possible surgical intervention. -MRI done and showed " Interval amputation of the 5th ray through the base of the 5th metatarsal. 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 Probable early osteomyelitis involving the head of the 3rd metatarsal, the proximal 4th phalanx and distal half of the remaining 5th metatarsal. Soft tissue ulceration lateral to the 4th metatarsal with surrounding inflammation. No focal fluid collection. Atrophy and T2 hyperintensity throughout the forefoot musculature." -Patient started on Zosyn in the ED, podiatry was consulted and patient was taken to the OR on 08/26/2022 and had a bone biopsy of the fourth metatarsal as well as a transmetatarsal amputation on the right and tendo Achilles lengthening; per Dr. Logan Bores he does not suspect clean osseous margins postoperatively and will need to continue IV antibiotics -Continue to monitor on telemetry overnight -Appreciate podiatry recommendations and assistance -Continue with IV Zosyn and given his poor chance of healing we have consulted infectious diseases for further antibiotic management -ID consulted and recommending continuing IV Zosyn pending further surgical evaluation and they believe that the patient exhausted all his efforts at limb salvage progression with osteomyelitis suspected prolonged antibiotic therapy and attempt at limb salvage and 1 amputation November 2024.  They feel that he needs a more definitive surgery at this time and recommending having the patient consider a BKA but patient is undergoing a TMA for further attempted  foot salvage and prevention of BKA; unfortunately patient did not have clean margins but ID is now recommending  continue antibiotics for 6 weeks and recommending IV Zosyn and following of the cultures and pathology.  They recommend IV antibiotics and they feel he has failed oral antibiotics.  Given his renal function they are requesting a central Cathter for Prolonged IV Access so have asked IR to Place to try and preserve arm veins in case of future hemodialysis/fistula/graft placement requirement -Trend fever curve and WBC -Follow-up blood cultures and intraoperative cultures and ID feels that the duration of postoperative Abx will be at least 6 weeks given that margins were suspected not to be clear.  He now has a IJ central PICC -Now has an incisional wound VAC in place with no complications and dressings are left in place and podiatry recommends leaving the incisional wound VAC in place until the patient is ready for discharge to rehab facility; the plan will then to be to discharge the patient to rehab and place orders for wound care as he is will be nonweightbearing on the surgical extremity -Bowel Regimen initiated we will start him on MiraLAX 17 g twice daily and senna docusate 1 tab p.o. twice daily and Bisacodyl 10 mg rectal suppository daily as needed   PAD -Known history of PAD.  Did have angiogram in August 2023 with runoff.   -This was notable for right pedal circulation provided only by posterior tibial artery.  This is relevant to healing in the setting of osteomyelitis above. -Had previously been on aspirin and do not see this on patient's medication list currently -Saw Dr. [name] of Vascular surgery and has examined been worsened last September.  His angiography showed no options for reconstruction and he did have an inflow to the ankle via the peroneal only runoff which had robust arborization in the posterior tibial artery.  Fortunately there is no improvement chances is healing from a vascular standpoint at that time.   Diabetes Mellitus Type 2 -Currently holding Glipizide 5 mg p.o. daily  before breakfast and Dapagliflozin 10 mg po Daily -Most recent HbA1c was 5.9 -Continue Sensitive Novolog SSI AC -Continue to Monitor CBG's per Protocol; CBG Trend: Recent Labs  Lab 08/27/22 2058 08/28/22 0727 08/28/22 1104 08/28/22 1624 08/28/22 2031 08/29/22 0834 08/29/22 1147  GLUCAP 178* 156* 204* 131* 155* 142* 125*    Hypertension -> Hypotension and Orthostatic VS -Continued home Furosemide 40 mg po Daily and Metoprolol Succinate 12.5 mg po Daily we will now discontinue furosemide and discontinue the metoprolol -Continue to Monitor BP per Protocol -Last BP Reading was 161/74 but he was orthostatic upon working with physical therapy yesterday; see below -Adjust BP Meds as necessary   Hyperlipidemia -Do not currently see patient on any medications for this on MAR   Chronic Diastolic CHF  -Echo earlier this year with EF 50-55%, G2 DD, normal RV function. -Continue home Furosemide 40 mg po Daily and Metoprolol Succinate 12.5 mg po Daily; Currently holding Dapaglifozin 10 mg po Daily  -Strict I's and O's and Daily Weights.  Intake/Output Summary (Last 24 hours) at 08/29/2022 1209 Last data filed at 08/29/2022 1148 Gross per 24 hour  Intake 1080.75 ml  Output 1200 ml  Net -119.25 ml  -Started Gentle IVF Hydration given Orthostasis and IVF to stop today -Continue to Monitor for S/Sx of Volume Overload  Orthostatic Hypotension -BP dropped and patient had a near syncopal episode today; today's blood pressure dropped as low as 87/49 after  sitting at the edge of the bed for almost 8 minutes and perform exercises but he had no symptoms.  When his return to supine his blood pressure improved to 132/56 -Continue Hold Lasix for now -Started gentle IVF Hydration and will continue NS at 75 mL/hr x 1 day and will Discontinue; Order TED Hose (on the Left) and Abdominal Binder -Repeat Orthostatic VS pending  -Continue PT/OT to Evaluate and Treat and recommending SNF -See blood pressure  reading as above   CAD -Chart review shows history of angioplasty. -Recently seen Dr. Antoine Poche with cardiology and he had an echocardiogram in February of this year which showed an EF of 50 to 55% -Holding both Metoprolol Succinate 12.5 mg po Daily and holding Lasix now  Normocytic Anemia -Likely dilutional and Post-Operative Drop -Hgb/Hct Trend: Recent Labs  Lab 08/19/22 1432 08/24/22 1105 08/24/22 1105 08/25/22 0421 08/26/22 0226 08/27/22 1052 08/28/22 0127 08/29/22 0158  HGB 13.2 14.9  --  15.1 14.3 13.3 11.9* 11.6*  HCT 39.3 47.3   < > 45.7 42.7 41.1 36.6* 35.8*  MCV 83 86.0   < > 85.3 82.3 85.1 84.3 84.4   < > = values in this interval not displayed.  -Checked Anemia Panel and showed an iron level of 23, UIBC 176, TIBC 199, saturation ratios of 12%, ferritin level 152, v folate level 11.9 and a vitamin B12 of 273 -Continue to Monitor for S/Sx of Bleeding; No overt bleeding noted -Repeat CBC in the AM    AKI on CKD Stage 3a -BUN/Cr Trend: Recent Labs  Lab 08/19/22 1432 08/24/22 1105 08/25/22 0421 08/26/22 0226 08/27/22 1052 08/28/22 0127 08/29/22 0158  BUN 43* 35* 31* 35* 32* 33* 22  CREATININE 1.48* 1.53* 1.52* 1.83* 1.80* 1.68* 1.46*  -Avoid Nephrotoxic Medications, Contrast Dyes, Hypotension and Dehydration to Ensure Adequate Renal Perfusion and will need to Renally Adjust Meds -Was given IV Lasix and now changed back to oral Lasix but will hold due to Orthostatic Hypotension -Continue to Monitor and Trend Renal Function carefully and repeat CMP in the AM    Hypoalbuminemia -Patient's Albumin Trend: Recent Labs  Lab 08/19/22 1432 08/24/22 1105 08/25/22 0421 08/26/22 0226 08/27/22 1052 08/28/22 0127 08/29/22 0158  ALBUMIN 4.0 3.7 3.3* 3.4* 3.3* 3.0* 2.9*  -Continue to Monitor and Trend and repeat CMP in the AM  Severe malnutrition in the context of chronic illness Nutrition Status: Nutrition Problem: Severe Malnutrition Etiology: chronic  illness Signs/Symptoms: severe fat depletion, severe muscle depletion   -Nutrition recently been consulted and recommending Nepro shakes p.o. twice daily as well as Prosource +30 mL p.o. twice daily as well as a multivitamin p.o. daily, vitamin C 500 mg p.o. twice daily, zinc to 20 mg p.o. daily for 14 days  -The dietitian feels the patient is a high refeeding risk and recommending monitoring potassium magnesium and phosphorus labs daily until stable and also when the patient daily  Tinea Cruris -Diflucan Weekly Treatment (On Thursdays) and Nystatin Cream   DVT prophylaxis: Place TED hose Start: 08/27/22 1532 Place TED hose Start: 08/27/22 1422 SCDs Start: 08/26/22 2051 SCDs Start: 08/24/22 1253    Code Status: DNR Family Communication: No family currently at bedside  Disposition Plan:  Level of care: Telemetry Surgical Status is: Inpatient Remains inpatient appropriate because: Needs to go to SNF and TOC to assist with discharge disposition   Consultants:  Podiatry Infectious Diseases  IR  Procedures:  As delineated as above   Procedure by Dr. Logan Bores:  1.  Tendo Achilles lengthening right 2.  Transmetatarsal amputation right 3.  Bone biopsy fourth metatarsal right    Antimicrobials:  Anti-infectives (From admission, onward)    Start     Dose/Rate Route Frequency Ordered Stop   08/26/22 1400  fluconazole (DIFLUCAN) tablet 150 mg        150 mg Oral Weekly 08/26/22 1236 09/23/22 1359   08/24/22 2200  piperacillin-tazobactam (ZOSYN) IVPB 3.375 g        3.375 g 12.5 mL/hr over 240 Minutes Intravenous Every 8 hours 08/24/22 1248     08/24/22 1300  piperacillin-tazobactam (ZOSYN) IVPB 3.375 g        3.375 g 100 mL/hr over 30 Minutes Intravenous  Once 08/24/22 1248 08/24/22 1403       Subjective: Seen and examined at bedside and he is resting doing fairly well.  Denies chest pain or shortness of breath.  Nursing states that he has a had a bowel movement so bowel regimen  has been initiated.  He denies any pain and feels okay.  No other concerns or complaints at this time.  Objective: Vitals:   08/28/22 0735 08/28/22 2031 08/29/22 0426 08/29/22 0837  BP: (!) 159/63 (!) 146/57 (!) 155/67 (!) 161/74  Pulse: 70 68 75 75  Resp:  20 18 18   Temp: 97.8 F (36.6 C) 98.9 F (37.2 C)  98 F (36.7 C)  TempSrc: Oral Oral    SpO2: 96% 96% 95% 94%  Weight:      Height:        Intake/Output Summary (Last 24 hours) at 08/29/2022 1321 Last data filed at 08/29/2022 1148 Gross per 24 hour  Intake 1080.75 ml  Output 1200 ml  Net -119.25 ml   Filed Weights   08/24/22 1053 08/25/22 0500  Weight: 78.9 kg 76.7 kg   Examination: Physical Exam:  Constitutional: Elderly chronically ill-appearing Caucasian male in no acute distress Respiratory: Diminished to auscultation bilaterally, no wheezing, rales, rhonchi or crackles. Normal respiratory effort and patient is not tachypenic. No accessory muscle use.  Unlabored breathing Cardiovascular: RRR, no murmurs / rubs / gallops. S1 and S2 auscultated.  He has a right foot TMA now Abdomen: Soft, non-tender, non-distended. Bowel sounds positive.  GU: Deferred. Musculoskeletal: No clubbing / cyanosis of digits/nails.  Has a right foot TMA Skin: No rashes, lesions, ulcers on limited skin evaluation. No induration; Warm and dry.  Neurologic: CN 2-12 grossly intact with no focal deficits. Romberg sign and cerebellar reflexes not assessed.  Psychiatric: He was resting in a little sleepy but easily arousable and in appears calm is a normal mood and affect.  Data Reviewed: I have personally reviewed following labs and imaging studies  CBC: Recent Labs  Lab 08/24/22 1105 08/25/22 0421 08/26/22 0226 08/27/22 1052 08/28/22 0127 08/29/22 0158  WBC 5.5 6.3 7.3 10.0 7.7 7.3  NEUTROABS 3.2  --  4.1 6.1 5.1 4.4  HGB 14.9 15.1 14.3 13.3 11.9* 11.6*  HCT 47.3 45.7 42.7 41.1 36.6* 35.8*  MCV 86.0 85.3 82.3 85.1 84.3 84.4  PLT 205  210 209 204 178 163   Basic Metabolic Panel: Recent Labs  Lab 08/25/22 0421 08/26/22 0226 08/27/22 1052 08/28/22 0127 08/29/22 0158  NA 138 140 134* 136 141  K 4.6 3.8 3.7 3.5 3.5  CL 104 104 102 103 105  CO2 26 24 23 24 27   GLUCOSE 115* 116* 158* 166* 125*  BUN 31* 35* 32* 33* 22  CREATININE 1.52* 1.83* 1.80* 1.68* 1.46*  CALCIUM  9.1 8.9 8.5* 8.2* 8.3*  MG  --  2.7* 2.8* 2.5* 2.4  PHOS  --  4.2 3.9 3.7 2.9   GFR: Estimated Creatinine Clearance: 41.6 mL/min (A) (by C-G formula based on SCr of 1.46 mg/dL (H)). Liver Function Tests: Recent Labs  Lab 08/25/22 0421 08/26/22 0226 08/27/22 1052 08/28/22 0127 08/29/22 0158  AST 13* 13* 15 12* 12*  ALT 10 12 11 9 10   ALKPHOS 91 81 73 64 65  BILITOT 1.1 1.2 1.3* 1.0 1.2  PROT 7.1 7.1 6.7 6.2* 6.2*  ALBUMIN 3.3* 3.4* 3.3* 3.0* 2.9*   No results for input(s): "LIPASE", "AMYLASE" in the last 168 hours. No results for input(s): "AMMONIA" in the last 168 hours. Coagulation Profile: No results for input(s): "INR", "PROTIME" in the last 168 hours. Cardiac Enzymes: No results for input(s): "CKTOTAL", "CKMB", "CKMBINDEX", "TROPONINI" in the last 168 hours. BNP (last 3 results) No results for input(s): "PROBNP" in the last 8760 hours. HbA1C: No results for input(s): "HGBA1C" in the last 72 hours. CBG: Recent Labs  Lab 08/28/22 1104 08/28/22 1624 08/28/22 2031 08/29/22 0834 08/29/22 1147  GLUCAP 204* 131* 155* 142* 125*   Lipid Profile: No results for input(s): "CHOL", "HDL", "LDLCALC", "TRIG", "CHOLHDL", "LDLDIRECT" in the last 72 hours. Thyroid Function Tests: No results for input(s): "TSH", "T4TOTAL", "FREET4", "T3FREE", "THYROIDAB" in the last 72 hours. Anemia Panel: Recent Labs    08/29/22 0158  VITAMINB12 273  FOLATE 11.9  FERRITIN 152  TIBC 199*  IRON 23*  RETICCTPCT 1.4   Sepsis Labs: Recent Labs  Lab 08/24/22 1105  LATICACIDVEN 1.1    Recent Results (from the past 240 hour(s))  Blood Cultures x 2  sites     Status: None   Collection Time: 08/24/22 10:56 AM   Specimen: BLOOD RIGHT FOREARM  Result Value Ref Range Status   Specimen Description BLOOD RIGHT FOREARM  Final   Special Requests   Final    BOTTLES DRAWN AEROBIC AND ANAEROBIC Blood Culture adequate volume   Culture   Final    NO GROWTH 5 DAYS Performed at [facility], [address], [place], [place] [address]    Report Status 08/29/2022 FINAL  Final  Blood Cultures x 2 sites     Status: None   Collection Time: 08/24/22  3:54 PM   Specimen: BLOOD  Result Value Ref Range Status   Specimen Description BLOOD RIGHT ANTECUBITAL  Final   Special Requests   Final    BOTTLES DRAWN AEROBIC AND ANAEROBIC Blood Culture adequate volume   Culture   Final    NO GROWTH 5 DAYS Performed at [facility], [address], [place], [place] [address]    Report Status 08/29/2022 FINAL  Final  Aerobic/Anaerobic Culture w Gram Stain (surgical/deep wound)     Status: None (Preliminary result)   Collection Time: 08/26/22  6:23 PM   Specimen: Soft Tissue, Other  Result Value Ref Range Status   Specimen Description TISSUE  Final   Special Requests RT 4TH METATARSAL  Final   Gram Stain NO WBC SEEN NO ORGANISMS SEEN   Final   Culture   Final    NO GROWTH 2 DAYS NO ANAEROBES ISOLATED; CULTURE IN PROGRESS FOR 5 DAYS Performed at [facility], [address] [place], [place] [address]    Report Status PENDING  Incomplete    Radiology Studies: IR Fluoro Guide CV Line Right  Result Date: 08/27/2022 INDICATION: Osteomyelitis, access for  long-term IV antibiotics EXAM: TUNNELED PICC LINE WITH ULTRASOUND AND FLUOROSCOPIC GUIDANCE MEDICATIONS: 1% lidocaine local. ANESTHESIA/SEDATION: None. FLUOROSCOPY: Radiation Exposure Index (as provided by the fluoroscopic device): 1.0 mGy Kerma COMPLICATIONS: None immediate. PROCEDURE: Informed written consent was obtained from the patient after a discussion of the risks, benefits, and  alternatives to treatment. Questions regarding the procedure were encouraged and answered. The right neck and chest were prepped with chlorhexidine in a sterile fashion, and a sterile drape was applied covering the operative field. Maximum barrier sterile technique with sterile gowns and gloves were used for the procedure. A timeout was performed prior to the initiation of the procedure. After creating a small venotomy incision, a micropuncture kit was utilized to access the right internal jugular vein under direct, real-time ultrasound guidance after the overlying soft tissues were anesthetized with 1% lidocaine with epinephrine. Ultrasound image documentation was performed. The microwire was kinked to measure appropriate catheter length. The micropuncture sheath was exchanged for a peel-away sheath over a guidewire. A 5 French dual lumen tunneled PICC measuring 29 cm was tunneled in a retrograde fashion from the anterior chest wall to the venotomy incision. The catheter was then placed through the peel-away sheath with tip ultimately positioned at the superior caval-atrial junction. Final catheter positioning was confirmed and documented with a spot radiographic image. The catheter aspirates and flushes normally. The catheter was flushed with appropriate volume heparin dwells. The catheter exit site was secured with a 2-0 Ethilon retention suture. The venotomy incision was closed with Dermabond. Dressings were applied. The patient tolerated the procedure well without immediate post procedural complication. FINDINGS: After catheter placement, the tip lies within the superior cavoatrial junction. The catheter aspirates and flushes normally and is ready for immediate use. IMPRESSION: Successful placement of 29cm dual lumen tunneled PICC catheter via the right internal jugular vein with tip terminating at the superior caval atrial junction. The catheter is ready for immediate use. Electronically Signed   By: Judie Petit.  Shick  M.D.   On: 08/27/2022 17:11   IR US Guide Vasc Access Right  Result Date: 08/27/2022 INDICATION: Osteomyelitis, access for long-term IV antibiotics EXAM: TUNNELED PICC LINE WITH ULTRASOUND AND FLUOROSCOPIC GUIDANCE MEDICATIONS: 1% lidocaine local. ANESTHESIA/SEDATION: None. FLUOROSCOPY: Radiation Exposure Index (as provided by the fluoroscopic device): 1.0 mGy Kerma COMPLICATIONS: None immediate. PROCEDURE: Informed written consent was obtained from the patient after a discussion of the risks, benefits, and alternatives to treatment. Questions regarding the procedure were encouraged and answered. The right neck and chest were prepped with chlorhexidine in a sterile fashion, and a sterile drape was applied covering the operative field. Maximum barrier sterile technique with sterile gowns and gloves were used for the procedure. A timeout was performed prior to the initiation of the procedure. After creating a small venotomy incision, a micropuncture kit was utilized to access the right internal jugular vein under direct, real-time ultrasound guidance after the overlying soft tissues were anesthetized with 1% lidocaine with epinephrine. Ultrasound image documentation was performed. The microwire was kinked to measure appropriate catheter length. The micropuncture sheath was exchanged for a peel-away sheath over a guidewire. A 5 French dual lumen tunneled PICC measuring 29 cm was tunneled in a retrograde fashion from the anterior chest wall to the venotomy incision. The catheter was then placed through the peel-away sheath with tip ultimately positioned at the superior caval-atrial junction. Final catheter positioning was confirmed and documented with a spot radiographic image. The catheter aspirates and flushes normally. The catheter was flushed with appropriate  volume heparin dwells. The catheter exit site was secured with a 2-0 Ethilon retention suture. The venotomy incision was closed with Dermabond. Dressings  were applied. The patient tolerated the procedure well without immediate post procedural complication. FINDINGS: After catheter placement, the tip lies within the superior cavoatrial junction. The catheter aspirates and flushes normally and is ready for immediate use. IMPRESSION: Successful placement of 29cm dual lumen tunneled PICC catheter via the right internal jugular vein with tip terminating at the superior caval atrial junction. The catheter is ready for immediate use. Electronically Signed   By: Judie Petit.  Shick M.D.   On: 08/27/2022 17:11    Scheduled Meds:  (feeding supplement) PROSource Plus  30 mL Oral BID BM   ascorbic acid  500 mg Oral BID   Chlorhexidine Gluconate Cloth  6 each Topical Q0600   feeding supplement (NEPRO CARB STEADY)  237 mL Oral BID BM   fluconazole  150 mg Oral Weekly   insulin aspart  0-9 Units Subcutaneous TID WC   metoprolol succinate  12.5 mg Oral Daily   multivitamin with minerals  1 tablet Oral Daily   nystatin cream   Topical BID   polyethylene glycol  17 g Oral BID   senna-docusate  1 tablet Oral BID   sodium chloride flush  3 mL Intravenous Q12H   zinc sulfate  220 mg Oral Daily   Continuous Infusions:  sodium chloride 75 mL/hr at 08/28/22 1958   piperacillin-tazobactam (ZOSYN)  IV 3.375 g (08/29/22 0528)    LOS: 4 days   Marguerita Merles, DO [facility] Available via Epic secure chat 7am-7pm After these hours, please refer to coverage provider listed on [email or web address] 08/29/2022, 1:21 PM

## 2022-08-29 NOTE — Plan of Care (Signed)
  Problem: Education: Goal: Knowledge of General Education information will improve Description: Including pain rating scale, medication(s)/side effects and non-pharmacologic comfort measures Outcome: Progressing   Problem: Clinical Measurements: Goal: Will remain free from infection Outcome: Progressing   Problem: Nutrition: Goal: Adequate nutrition will be maintained Outcome: Progressing   Problem: Safety: Goal: Ability to remain free from injury will improve Outcome: Progressing   

## 2022-08-30 DIAGNOSIS — Z8619 Personal history of other infectious and parasitic diseases: Secondary | ICD-10-CM

## 2022-08-30 DIAGNOSIS — M869 Osteomyelitis, unspecified: Secondary | ICD-10-CM | POA: Diagnosis not present

## 2022-08-30 DIAGNOSIS — M86171 Other acute osteomyelitis, right ankle and foot: Secondary | ICD-10-CM | POA: Diagnosis not present

## 2022-08-30 DIAGNOSIS — N1831 Chronic kidney disease, stage 3a: Secondary | ICD-10-CM | POA: Diagnosis not present

## 2022-08-30 DIAGNOSIS — I739 Peripheral vascular disease, unspecified: Secondary | ICD-10-CM | POA: Diagnosis not present

## 2022-08-30 DIAGNOSIS — E1151 Type 2 diabetes mellitus with diabetic peripheral angiopathy without gangrene: Secondary | ICD-10-CM | POA: Diagnosis not present

## 2022-08-30 LAB — CBC WITH DIFFERENTIAL/PLATELET
Abs Immature Granulocytes: 0.02 10*3/uL (ref 0.00–0.07)
Basophils Absolute: 0 10*3/uL (ref 0.0–0.1)
Basophils Relative: 1 %
Eosinophils Absolute: 0.3 10*3/uL (ref 0.0–0.5)
Eosinophils Relative: 5 %
HCT: 34.4 % — ABNORMAL LOW (ref 39.0–52.0)
Hemoglobin: 11.1 g/dL — ABNORMAL LOW (ref 13.0–17.0)
Immature Granulocytes: 0 %
Lymphocytes Relative: 27 %
Lymphs Abs: 1.7 10*3/uL (ref 0.7–4.0)
MCH: 27.5 pg (ref 26.0–34.0)
MCHC: 32.3 g/dL (ref 30.0–36.0)
MCV: 85.4 fL (ref 80.0–100.0)
Monocytes Absolute: 0.6 10*3/uL (ref 0.1–1.0)
Monocytes Relative: 9 %
Neutro Abs: 3.7 10*3/uL (ref 1.7–7.7)
Neutrophils Relative %: 58 %
Platelets: 150 10*3/uL (ref 150–400)
RBC: 4.03 MIL/uL — ABNORMAL LOW (ref 4.22–5.81)
RDW: 13.8 % (ref 11.5–15.5)
WBC: 6.3 10*3/uL (ref 4.0–10.5)
nRBC: 0 % (ref 0.0–0.2)

## 2022-08-30 LAB — GLUCOSE, CAPILLARY
Glucose-Capillary: 113 mg/dL — ABNORMAL HIGH (ref 70–99)
Glucose-Capillary: 171 mg/dL — ABNORMAL HIGH (ref 70–99)
Glucose-Capillary: 169 mg/dL — ABNORMAL HIGH (ref 70–99)
Glucose-Capillary: 246 mg/dL — ABNORMAL HIGH (ref 70–99)

## 2022-08-30 LAB — PHOSPHORUS: Phosphorus: 2.2 mg/dL — ABNORMAL LOW (ref 2.5–4.6)

## 2022-08-30 LAB — COMPREHENSIVE METABOLIC PANEL
ALT: 9 U/L (ref 0–44)
AST: 11 U/L — ABNORMAL LOW (ref 15–41)
Albumin: 2.6 g/dL — ABNORMAL LOW (ref 3.5–5.0)
Alkaline Phosphatase: 62 U/L (ref 38–126)
Anion gap: 9 (ref 5–15)
BUN: 19 mg/dL (ref 8–23)
CO2: 21 mmol/L — ABNORMAL LOW (ref 22–32)
Calcium: 8.1 mg/dL — ABNORMAL LOW (ref 8.9–10.3)
Chloride: 108 mmol/L (ref 98–111)
Creatinine, Ser: 1.18 mg/dL (ref 0.61–1.24)
GFR, Estimated: >60 mL/min (ref >60)
Glucose, Bld: 131 mg/dL — ABNORMAL HIGH (ref 70–99)
Potassium: 3.4 mmol/L — ABNORMAL LOW (ref 3.5–5.1)
Sodium: 138 mmol/L (ref 135–145)
Total Bilirubin: 0.9 mg/dL (ref 0.3–1.2)
Total Protein: 5.9 g/dL — ABNORMAL LOW (ref 6.5–8.1)

## 2022-08-30 LAB — SURGICAL PATHOLOGY

## 2022-08-30 LAB — MAGNESIUM: Magnesium: 2.4 mg/dL (ref 1.7–2.4)

## 2022-08-30 MED ORDER — NEPRO/CARBSTEADY PO LIQD: 237.0000 mL | Freq: Two times a day (BID) | ORAL | 0 refills | Status: DC

## 2022-08-30 MED ORDER — PROSOURCE PLUS PO LIQD: 30.0000 mL | Freq: Two times a day (BID) | ORAL | Status: DC

## 2022-08-30 MED ORDER — ASCORBIC ACID 500 MG PO TABS: 500.0000 mg | ORAL_TABLET | Freq: Two times a day (BID) | ORAL | Status: DC

## 2022-08-30 MED ORDER — POLYETHYLENE GLYCOL 3350 17 G PO PACK: 17.0000 g | PACK | Freq: Two times a day (BID) | ORAL | 0 refills | Status: DC

## 2022-08-30 MED ORDER — SENNOSIDES-DOCUSATE SODIUM 8.6-50 MG PO TABS: 1.0000 | ORAL_TABLET | Freq: Two times a day (BID) | ORAL | 0 refills | Status: DC

## 2022-08-30 MED ORDER — FLUCONAZOLE 150 MG PO TABS: 150.0000 mg | ORAL_TABLET | ORAL | Status: DC

## 2022-08-30 MED ORDER — BISACODYL 10 MG RE SUPP: 10.0000 mg | Freq: Every day | RECTAL | 0 refills | Status: DC | PRN

## 2022-08-30 MED ORDER — NYSTATIN 100000 UNIT/GM EX CREA: TOPICAL_CREAM | Freq: Two times a day (BID) | CUTANEOUS | 0 refills | Status: DC

## 2022-08-30 MED ORDER — SODIUM CHLORIDE 0.9 % IV SOLN: INTRAVENOUS | Status: DC

## 2022-08-30 MED ORDER — K PHOS MONO-SOD PHOS DI & MONO 155-852-130 MG PO TABS
500.0000 mg | ORAL_TABLET | Freq: Two times a day (BID) | ORAL | Status: AC
  Administered 2022-08-30 (×2): 500 mg via ORAL
  Filled 2022-08-30 (×2): qty 2

## 2022-08-30 MED ORDER — PIPERACILLIN-TAZOBACTAM IV (FOR PTA / DISCHARGE USE ONLY): 3.3750 g | Freq: Four times a day (QID) | INTRAVENOUS | 0 refills | Status: AC

## 2022-08-30 MED ORDER — ZINC SULFATE 220 (50 ZN) MG PO CAPS: 220.0000 mg | ORAL_CAPSULE | Freq: Every day | ORAL | 0 refills | Status: DC

## 2022-08-30 MED ORDER — POTASSIUM CHLORIDE CRYS ER 20 MEQ PO TBCR
40.0000 meq | EXTENDED_RELEASE_TABLET | Freq: Two times a day (BID) | ORAL | Status: AC
  Administered 2022-08-30 (×2): 40 meq via ORAL
  Filled 2022-08-30 (×2): qty 2

## 2022-08-30 MED ORDER — SODIUM CHLORIDE 0.9 % IV BOLUS
500.0000 mL | Freq: Once | INTRAVENOUS | Status: AC
  Administered 2022-08-30: 500 mL via INTRAVENOUS

## 2022-08-30 MED ORDER — ACETAMINOPHEN 325 MG PO TABS: 650.0000 mg | ORAL_TABLET | Freq: Four times a day (QID) | ORAL | 0 refills | Status: DC | PRN

## 2022-08-30 NOTE — Progress Notes (Addendum)
[facility]    Date of Admission:  08/24/2022   Total days of antibiotics 7           ID: Wayne [name] is a 82 y.o. 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 Doing well. Had right chest wall central line placed over the weekend.  Less pain at right TMA site, but dr Clayburn Pert does not suspect clean margins. Cx - still no growth to date (however was on abtx coming into surgery) Path:  A. BONE, RIGHT MID FOOT, BIOPSY:  - Reactive bone with acute and chronic osteomyelitis  - Soft tissue with chronic inflammation  - Focal osteonecrosis  Medications:   (feeding supplement) PROSource Plus  30 mL Oral BID BM   ascorbic acid  500 mg Oral BID   Chlorhexidine Gluconate Cloth  6 each Topical Q0600   feeding supplement (NEPRO CARB STEADY)  237 mL Oral BID BM   fluconazole  150 mg Oral Weekly   insulin aspart  0-9 Units Subcutaneous TID WC   metoprolol succinate  12.5 mg Oral Daily   multivitamin with minerals  1 tablet Oral Daily   nystatin cream   Topical BID   phosphorus  500 mg Oral BID   polyethylene glycol  17 g Oral BID   potassium chloride  40 mEq Oral BID   senna-docusate  1 tablet Oral BID   sodium chloride flush  3 mL Intravenous Q12H   zinc sulfate  220 mg Oral Daily    Objective: Vital signs in last 24 hours: Temp:  [97.9 F (36.6 C)-98.3 F (36.8 C)] 98 F (36.7 C) (06/03 0729) Pulse Rate:  [60-64] 64 (06/03 0729) Resp:  [16-17] 16 (06/03  0425) BP: (145-169)/(54-82) 169/82 (06/03 0729) SpO2:  [96 %-97 %] 97 % (06/03 0729) Weight:  [80 kg] 80 kg (06/03 0500)  Physical Exam  Constitutional: He is oriented to person, place, and time. He appears well-developed and well-nourished. No distress.  HENT:  Mouth/Throat: Oropharynx is clear and moist. No oropharyngeal exudate.  Cardiovascular: Normal rate, regular rhythm and normal heart sounds. Exam reveals no gallop and no friction rub.  No murmur heard.  Pulmonary/Chest: Effort normal and breath sounds normal. No respiratory distress. He has no wheezes.  Abdominal: Soft. Bowel sounds are normal. He exhibits no distension. There is no tenderness.  ZOX:WRUEA foot wrapped Skin: Skin is warm and dry. No rash noted. No erythema.  Psychiatric: He has a normal mood and affect. His behavior is normal.    Lab Results Recent Labs    08/29/22 0158 08/30/22 0307  WBC 7.3 6.3  HGB 11.6* 11.1*  HCT 35.8* 34.4*  NA 141 138  K 3.5 3.4*  CL 105 108  CO2 27 21*  BUN 22 19  CREATININE 1.46* 1.18   Liver Panel Recent Labs    08/29/22 0158 08/30/22 0307  PROT 6.2* 5.9*  ALBUMIN 2.9* 2.6*  AST 12* 11*  ALT 10 9  ALKPHOS 65 62  BILITOT 1.2 0.9  Lab Results  Component Value Date   ESRSEDRATE 31 (H) 07/20/2022   Lab Results  Component Value Date   CRP 18.5 (H) 07/20/2022    Microbiology: reviewed Studies/Results: No results found.   Assessment/Plan: Right foot (multiple metatarsals) osteomyelitis = currently on piptazo. Prior cx in April showed pseudomonas and prevotella. Current cx are no growth to date. Plan to treat for 6 wk of IV Abtx and plan to get care through SNF.   Diagnosis: Osteomeylitis of right foot  Culture Result:   No Known Allergies  OPAT Orders Discharge antibiotics to be given via PICC line Discharge antibiotics: Per pharmacy protocol - pip/tazo  Duration: 6 wk End Date: July 11th 2024  [facility] Care Per Protocol:  Home health RN for IV  administration and teaching; PICC line care and labs.    Labs weekly while on IV antibiotics: _x_ CBC with differential _x_ BMP  __x CRP __x ESR   _x_ Please pull PIC at completion of IV antibiotics   Fax weekly labs to [phone]  Clinic Follow Up Appt: 5-6 wk at [facility]  -------- I have personally spent 50 minutes involved in face-to-face and non-face-to-face activities for this patient on the day of the visit. Professional time spent includes the following activities: Preparing to see the patient (review of tests), Obtaining and/or reviewing separately obtained history (admission/discharge record), Performing a medically appropriate examination and/or evaluation , Ordering medications/tests/procedures, referring and communicating with other health care professionals, Documenting clinical information in the EMR, Independently interpreting results (not separately reported), Communicating results to the patient/family/caregiver, Counseling and educating the patient/family/caregiver and Care coordination (not separately reported).     [facility] Pager: [phone]  08/30/2022, 3:07 PM

## 2022-08-30 NOTE — Progress Notes (Signed)
PROGRESS NOTE    Wayne [name]  ZOX:[id number] DOB: 11-17-1940 DOA: 08/24/2022 PCP: Sonny Masters, FNP   Brief Narrative:  he patient is an 82 year old Caucasian male with a past medical history significant for but limited to diabetes mellitus type 2, neuropathy, PAD, hypertension, hyperlipidemia, CKD stage IIIa, subdural hematoma, chronic diastolic CHF, CAD as well as other comorbidities who presented with right foot osteomyelitis and pain.  He has been having ongoing right foot pain and he had a previous right fifth metatarsal amputation and has been following with wound care, infectious disease and podiatry in outpatient setting.  He been on 8-week course of Augmentin and ciprofloxacin and follow-up with his podiatrist yesterday and there is concern for osteomyelitis which was confirmed on the x-ray in the office and there is also new fracture noted.  He was sent to the ED for IV antibiotics and admission for surgical intervention.  In the ED he was noted to have elevated blood pressures and heart rate in the 50s.  Lab workup was revealing BUN/creatinine of 35/1.35.  Blood cultures obtained and right foot x-rays were done in the podiatry office were not currently visible or reported.    Podiatry recommended MRI and antibiotics and he was started on IV Zosyn.  ID has been consulted and they are recommending continue IV Zosyn pending further surgical intervention and patient underwent TMA of his right foot on 08/26/2022.  He is postoperative day 4 and remains a little orthostatic but Renal Fxn improved and repeat CMP is pending.    Assessment and Plan:   Osteomyelitis of right fourth metatarsal status post a tendo Achilles lengthening on the right, transmetatarsal amputation on the right and a bone biopsy of the fourth metatarsal in the right POD4 -History of prior foot infection status amputation of right fifth metatarsal. -Has had ongoing foot pain and was at podiatry's office today for  debridement/follow-up.  Concern for right fourth metatarsal changes. -X-ray in the office reportedly confirmed fracture and osteomyelitis.  Patient sent to the ED for IV antibiotics and possible surgical intervention. -MRI done and showed " Interval amputation of the 5th ray through the base of the 5th metatarsal. 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 Probable early osteomyelitis involving the head of the 3rd metatarsal, the proximal 4th phalanx and distal half of the remaining 5th metatarsal. Soft tissue ulceration lateral to the 4th metatarsal with surrounding inflammation. No focal fluid collection. Atrophy and T2 hyperintensity throughout the forefoot musculature." -Patient started on Zosyn in the ED, podiatry was consulted and patient was taken to the OR on 08/26/2022 and had a bone biopsy of the fourth metatarsal as well as a transmetatarsal amputation on the right and tendo Achilles lengthening; per Dr. Logan Bores he does not suspect clean osseous margins postoperatively and will need to continue IV antibiotics -Continue to monitor on telemetry overnight -Appreciate podiatry recommendations and assistance -Continue with IV Zosyn and given his poor chance of healing we have consulted infectious diseases for further antibiotic management -ID consulted and recommending continuing IV Zosyn pending further surgical evaluation and they believe that the patient exhausted all his efforts at limb salvage progression with osteomyelitis suspected prolonged antibiotic therapy and attempt at limb salvage and 1 amputation November 2024.  They feel that he needs a more definitive surgery at this time and recommending having the patient consider a BKA but patient is undergoing a TMA for further attempted foot salvage and  prevention of BKA; unfortunately patient did not have clean margins but ID is now recommending continue antibiotics for 6  weeks and recommending IV Zosyn and following of the cultures and pathology.  They recommend IV antibiotics and they feel he has failed oral antibiotics.  Given his renal function they are requesting a central Cathter for Prolonged IV Access so have asked IR to Place to try and preserve arm veins in case of future hemodialysis/fistula/graft placement requirement -Trend fever curve and WBC -Follow-up blood cultures and intraoperative cultures and ID feels that the duration of postoperative Abx will be at least 6 weeks given that margins were suspected not to be clear.  He now has a IJ central PICC -Now has an incisional wound VAC in place with no complications and dressings are left in place and podiatry recommends leaving the incisional wound VAC in place until the patient is ready for discharge to rehab facility; the plan will then to be to discharge the patient to rehab and place orders for wound care as he is will be nonweightbearing on the surgical extremity -Bowel Regimen initiated we will start him on MiraLAX 17 g twice daily and senna docusate 1 tab p.o. twice daily and Bisacodyl 10 mg rectal suppository daily as needed   PAD -Known history of PAD.  Did have angiogram in August 2023 with runoff.   -This was notable for right pedal circulation provided only by posterior tibial artery.  This is relevant to healing in the setting of osteomyelitis above. -Had previously been on aspirin and do not see this on patient's medication list currently -Saw Dr. [name] of Vascular surgery and has examined been worsened last September.  His angiography showed no options for reconstruction and he did have an inflow to the ankle via the peroneal only runoff which had robust arborization in the posterior tibial artery.  Fortunately there is no improvement chances is healing from a vascular standpoint at that time.   Diabetes Mellitus Type 2 -Currently holding Glipizide 5 mg p.o. daily before breakfast and  Dapagliflozin 10 mg po Daily -Most recent HbA1c was 5.9 -Continue Sensitive Novolog SSI AC -Continue to Monitor CBG's per Protocol; CBG Trend: Recent Labs  Lab 08/28/22 2031 08/29/22 0834 08/29/22 1147 08/29/22 1713 08/29/22 2124 08/30/22 0732 08/30/22 1115  GLUCAP 155* 142* 125* 151* 168* 113* 171*    Hypertension -> Hypotension and Orthostatic VS -Continued home Furosemide 40 mg po Daily and Metoprolol Succinate 12.5 mg po Daily we will now discontinue furosemide and discontinue the metoprolol -Continue to Monitor BP per Protocol -Last BP Reading was 169/82 but he was orthostatic upon working with physical therapy the day before yesterday; see below -Adjust BP Meds as necessary   Hyperlipidemia -Do not currently see patient on any medications for this on MAR  Hypokalemia -Patient's K+ Level Trend: Recent Labs  Lab 08/24/22 1105 08/25/22 0421 08/26/22 0226 08/27/22 1052 08/28/22 0127 08/29/22 0158 08/30/22 0307  K 4.4 4.6 3.8 3.7 3.5 3.5 3.4*  -Replete with po KCL 40 mEQ BID and po K Phos Neutral 500 mg po BID -Continue to Monitor and Replete as Necessary -Repeat CMP in the AM   Hypophosphatemia -Phos Level Trend: Recent Labs  Lab 08/26/22 0226 08/27/22 1052 08/28/22 0127 08/29/22 0158 08/30/22 0307  PHOS 4.2 3.9 3.7 2.9 2.2*  -Replete with po K Phos Neutral 500 mg po BID -Continue to Monitor and Replete as Necessary -Repeat Phos Level in the AM   Chronic Diastolic CHF  -Echo  earlier this year with EF 50-55%, G2 DD, normal RV function. -Continue home Furosemide 40 mg po Daily and Metoprolol Succinate 12.5 mg po Daily; Currently holding Dapaglifozin 10 mg po Daily  -Strict I's and O's and Daily Weights.  Intake/Output Summary (Last 24 hours) at 08/30/2022 1433 Last data filed at 08/30/2022 0730 Gross per 24 hour  Intake 240 ml  Output 0 ml  Net 240 ml  -IVF hydration now stopped  -Continue to Monitor for S/Sx of Volume Overload  Orthostatic  Hypotension -BP dropped and patient had a near syncopal episode today; today's blood pressure dropped as low as 87/49 after sitting at the edge of the bed for almost 8 minutes and perform exercises but he had no symptoms.  When his return to supine his blood pressure improved to 132/56 -Continue Hold Lasix for now -Started gentle IVF Hydration and will continue NS at 75 mL/hr x 1 day and will Discontinue; Order TED Hose (on the Left) and Abdominal Binder -Repeat Orthostatic VS pending this Afternoon -Continue PT/OT to Evaluate and Treat and recommending SNF -See blood pressure reading as above   CAD -Chart review shows history of angioplasty. -Recently seen Dr. Antoine Poche with cardiology and he had an echocardiogram in February of this year which showed an EF of 50 to 55% -Holding both Metoprolol Succinate 12.5 mg po Daily and holding Lasix now given orthostasis but may need to resume soon  Normocytic Anemia -Likely dilutional and Post-Operative Drop -Hgb/Hct Trend: Recent Labs  Lab 08/24/22 1105 08/25/22 0421 08/26/22 0226 08/27/22 1052 08/28/22 0127 08/29/22 0158 08/30/22 0307  HGB 14.9 15.1 14.3 13.3 11.9* 11.6* 11.1*  HCT 47.3 45.7 42.7 41.1 36.6* 35.8* 34.4*  MCV 86.0 85.3 82.3 85.1 84.3 84.4 85.4  -Checked Anemia Panel and showed an iron level of 23, UIBC 176, TIBC 199, saturation ratios of 12%, ferritin level 152, v folate level 11.9 and a vitamin B12 of 273 -Continue to Monitor for S/Sx of Bleeding; No overt bleeding noted -Repeat CBC in the AM    AKI on CKD Stage 3a, improved  -BUN/Cr Trend: Recent Labs  Lab 08/24/22 1105 08/25/22 0421 08/26/22 0226 08/27/22 1052 08/28/22 0127 08/29/22 0158 08/30/22 0307  BUN 35* 31* 35* 32* 33* 22 19  CREATININE 1.53* 1.52* 1.83* 1.80* 1.68* 1.46* 1.18  -Avoid Nephrotoxic Medications, Contrast Dyes, Hypotension and Dehydration to Ensure Adequate Renal Perfusion and will need to Renally Adjust Meds -Was given IV Lasix and now  changed back to oral Lasix but will hold due to Orthostatic Hypotension -Continue to Monitor and Trend Renal Function carefully and repeat CMP in the AM    Hypoalbuminemia -Patient's Albumin Trend: Recent Labs  Lab 08/24/22 1105 08/25/22 0421 08/26/22 0226 08/27/22 1052 08/28/22 0127 08/29/22 0158 08/30/22 0307  ALBUMIN 3.7 3.3* 3.4* 3.3* 3.0* 2.9* 2.6*  -Continue to Monitor and Trend and repeat CMP in the AM  Severe malnutrition in the context of chronic illness Nutrition Status: Nutrition Problem: Severe Malnutrition Etiology: chronic illness Signs/Symptoms: severe fat depletion, severe muscle depletion   -Nutrition recently been consulted and recommending Nepro shakes p.o. twice daily as well as Prosource +30 mL p.o. twice daily as well as a multivitamin p.o. daily, vitamin C 500 mg p.o. twice daily, zinc to 20 mg p.o. daily for 14 days  -The dietitian feels the patient is a high refeeding risk and recommending monitoring potassium magnesium and phosphorus labs daily until stable and also when the patient daily  Tinea Cruris -Diflucan Weekly Treatment (On  Thursdays) and Nystatin Cream   DVT prophylaxis: Place TED hose Start: 08/27/22 1532 Place TED hose Start: 08/27/22 1422 SCDs Start: 08/26/22 2051 SCDs Start: 08/24/22 1253    Code Status: DNR Family Communication: No family present at bedside  Disposition Plan:  Level of care: Telemetry Surgical Status is: Inpatient Remains inpatient appropriate because: Needs repeat Orthostatics and SNF with Insurance Auth and Bed availability   Consultants:  Podiatry Infectious Diseases  IR  Procedures:  As delineated as above   Procedure by Dr. Logan Bores:  1.  Tendo Achilles lengthening right 2.  Transmetatarsal amputation right 3.  Bone biopsy fourth metatarsal right  Antimicrobials:  Anti-infectives (From admission, onward)    Start     Dose/Rate Route Frequency Ordered Stop   08/26/22 1400  fluconazole (DIFLUCAN)  tablet 150 mg        150 mg Oral Weekly 08/26/22 1236 09/23/22 1359   08/24/22 2200  piperacillin-tazobactam (ZOSYN) IVPB 3.375 g        3.375 g 12.5 mL/hr over 240 Minutes Intravenous Every 8 hours 08/24/22 1248     08/24/22 1300  piperacillin-tazobactam (ZOSYN) IVPB 3.375 g        3.375 g 100 mL/hr over 30 Minutes Intravenous  Once 08/24/22 1248 08/24/22 1403       Subjective: Seen and examined at bedside and he was doing fairly well had no complaints.  Worked with therapy today and was able to sit in the chair.  Felt okay.  No nausea or vomiting.  Metastatic vital signs are pending.  Renal functions improved and currently has bed availability but no insurance authorization.  Objective: Vitals:   08/29/22 1943 08/30/22 0425 08/30/22 0500 08/30/22 0729  BP: (!) 145/54 (!) 160/63  (!) 169/82  Pulse: 60 63  64  Resp: 16 16    Temp: 97.9 F (36.6 C) 98.3 F (36.8 C)  98 F (36.7 C)  TempSrc:  Oral  Oral  SpO2: 96% 96%  97%  Weight:   80 kg   Height:        Intake/Output Summary (Last 24 hours) at 08/30/2022 1434 Last data filed at 08/30/2022 0730 Gross per 24 hour  Intake 240 ml  Output 0 ml  Net 240 ml   Filed Weights   08/24/22 1053 08/25/22 0500 08/30/22 0500  Weight: 78.9 kg 76.7 kg 80 kg   Examination: Physical Exam:  Constitutional: Elderly chronically ill-appearing Caucasian male in no acute distress Respiratory: Diminished to auscultation bilaterally with coarse breath sounds, no wheezing, rales, rhonchi or crackles. Normal respiratory effort and patient is not tachypenic. No accessory muscle use.  Unlabored breathing Cardiovascular: RRR, no murmurs / rubs / gallops. S1 and S2 auscultated.  Has a right foot TMA now Abdomen: Soft, non-tender, non-distended. Bowel sounds positive.  GU: Deferred. Musculoskeletal: No clubbing / cyanosis of digits/nails.  Has a right foot TMA Skin: No rashes, lesions, ulcers on limited skin evaluation. No induration; Warm and dry.   Neurologic: CN 2-12 grossly intact with no focal deficits. Romberg sign and cerebellar reflexes not assessed.  Psychiatric: He is awake and alert and appears calm  Data Reviewed: I have personally reviewed following labs and imaging studies  CBC: Recent Labs  Lab 08/26/22 0226 08/27/22 1052 08/28/22 0127 08/29/22 0158 08/30/22 0307  WBC 7.3 10.0 7.7 7.3 6.3  NEUTROABS 4.1 6.1 5.1 4.4 3.7  HGB 14.3 13.3 11.9* 11.6* 11.1*  HCT 42.7 41.1 36.6* 35.8* 34.4*  MCV 82.3 85.1 84.3 84.4 85.4  PLT 209 204 178 163 150   Basic Metabolic Panel: Recent Labs  Lab 08/26/22 0226 08/27/22 1052 08/28/22 0127 08/29/22 0158 08/30/22 0307  NA 140 134* 136 141 138  K 3.8 3.7 3.5 3.5 3.4*  CL 104 102 103 105 108  CO2 24 23 24 27  21*  GLUCOSE 116* 158* 166* 125* 131*  BUN 35* 32* 33* 22 19  CREATININE 1.83* 1.80* 1.68* 1.46* 1.18  CALCIUM 8.9 8.5* 8.2* 8.3* 8.1*  MG 2.7* 2.8* 2.5* 2.4 2.4  PHOS 4.2 3.9 3.7 2.9 2.2*   GFR: Estimated Creatinine Clearance: 52.1 mL/min (by C-G formula based on SCr of 1.18 mg/dL). Liver Function Tests: Recent Labs  Lab 08/26/22 0226 08/27/22 1052 08/28/22 0127 08/29/22 0158 08/30/22 0307  AST 13* 15 12* 12* 11*  ALT 12 11 9 10 9   ALKPHOS 81 73 64 65 62  BILITOT 1.2 1.3* 1.0 1.2 0.9  PROT 7.1 6.7 6.2* 6.2* 5.9*  ALBUMIN 3.4* 3.3* 3.0* 2.9* 2.6*   No results for input(s): "LIPASE", "AMYLASE" in the last 168 hours. No results for input(s): "AMMONIA" in the last 168 hours. Coagulation Profile: No results for input(s): "INR", "PROTIME" in the last 168 hours. Cardiac Enzymes: No results for input(s): "CKTOTAL", "CKMB", "CKMBINDEX", "TROPONINI" in the last 168 hours. BNP (last 3 results) No results for input(s): "PROBNP" in the last 8760 hours. HbA1C: No results for input(s): "HGBA1C" in the last 72 hours. CBG: Recent Labs  Lab 08/29/22 1147 08/29/22 1713 08/29/22 2124 08/30/22 0732 08/30/22 1115  GLUCAP 125* 151* 168* 113* 171*   Lipid  Profile: No results for input(s): "CHOL", "HDL", "LDLCALC", "TRIG", "CHOLHDL", "LDLDIRECT" in the last 72 hours. Thyroid Function Tests: No results for input(s): "TSH", "T4TOTAL", "FREET4", "T3FREE", "THYROIDAB" in the last 72 hours. Anemia Panel: Recent Labs    08/29/22 0158  VITAMINB12 273  FOLATE 11.9  FERRITIN 152  TIBC 199*  IRON 23*  RETICCTPCT 1.4   Sepsis Labs: Recent Labs  Lab 08/24/22 1105  LATICACIDVEN 1.1    Recent Results (from the past 240 hour(s))  Blood Cultures x 2 sites     Status: None   Collection Time: 08/24/22 10:56 AM   Specimen: BLOOD RIGHT FOREARM  Result Value Ref Range Status   Specimen Description BLOOD RIGHT FOREARM  Final   Special Requests   Final    BOTTLES DRAWN AEROBIC AND ANAEROBIC Blood Culture adequate volume   Culture   Final    NO GROWTH 5 DAYS Performed at [facility], [address], [place], [place] [address]    Report Status 08/29/2022 FINAL  Final  Blood Cultures x 2 sites     Status: None   Collection Time: 08/24/22  3:54 PM   Specimen: BLOOD  Result Value Ref Range Status   Specimen Description BLOOD RIGHT ANTECUBITAL  Final   Special Requests   Final    BOTTLES DRAWN AEROBIC AND ANAEROBIC Blood Culture adequate volume   Culture   Final    NO GROWTH 5 DAYS Performed at [facility], [address] [place], [place] [address]    Report Status 08/29/2022 FINAL  Final  Aerobic/Anaerobic Culture w Gram Stain (surgical/deep wound)     Status: None (Preliminary result)   Collection Time: 08/26/22  6:23 PM   Specimen: Soft Tissue, Other  Result Value Ref Range Status   Specimen Description TISSUE  Final   Special Requests RT 4TH METATARSAL  Final   Gram Stain NO  WBC SEEN NO ORGANISMS SEEN   Final   Culture   Final    NO GROWTH 4 DAYS NO ANAEROBES ISOLATED; CULTURE IN PROGRESS FOR 5 DAYS Performed at [facility], [address] [place], [place] [address]    Report Status PENDING  Incomplete     Radiology Studies: No results found.  Scheduled Meds:  (feeding supplement) PROSource Plus  30 mL Oral BID BM   ascorbic acid  500 mg Oral BID   Chlorhexidine Gluconate Cloth  6 each Topical Q0600   feeding supplement (NEPRO CARB STEADY)  237 mL Oral BID BM   fluconazole  150 mg Oral Weekly   insulin aspart  0-9 Units Subcutaneous TID WC   metoprolol succinate  12.5 mg Oral Daily   multivitamin with minerals  1 tablet Oral Daily   nystatin cream   Topical BID   phosphorus  500 mg Oral BID   polyethylene glycol  17 g Oral BID   potassium chloride  40 mEq Oral BID   senna-docusate  1 tablet Oral BID   sodium chloride flush  3 mL Intravenous Q12H   zinc sulfate  220 mg Oral Daily   Continuous Infusions:  piperacillin-tazobactam (ZOSYN)  IV 3.375 g (08/30/22 1332)    LOS: 5 days   Marguerita Merles, DO [facility] Available via Epic secure chat 7am-7pm After these hours, please refer to coverage provider listed on [email or web address] 08/30/2022, 2:34 PM

## 2022-08-30 NOTE — Progress Notes (Signed)
Physical Therapy Treatment Patient Details Name: Wayne [name] MRN: [id number] DOB: [birth year] Today's Date: 08/30/2022   History of Present Illness 82 y.o. male admitted 5/28 with Rt foot osteomyelitis. 5/30 s/p Tendo Achilles lengthening right and Transmetatarsal amputation right.   PM Hof type 2 diabetes, dyslipidemia, peripheral artery disease.    PT Comments    Participated in bed mobility and transfer training. Still limited by symptomatic hypotension but stable in seated position today. Mod assist for sit to stand and pivot transfer to recliner. Requires assist to maintain NWB on RLE, with intermittent recall. Patient will continue to benefit from skilled physical therapy services to further improve independence with functional mobility.  BP seated EOB 140/64 HR 64; Unable to tolerate standing BP due to dizziness; After transfer, pt in recliner with BP 101/52 HR 65 (reports resolution of dizziness.) RN in room providing bolus.    Recommendations for follow up therapy are one component of a multi-disciplinary discharge planning process, led by the attending physician.  Recommendations may be updated based on patient status, additional functional criteria and insurance authorization.  Follow Up Recommendations  Can patient physically be transported by private vehicle: No    Assistance Recommended at Discharge Frequent or constant Supervision/Assistance  Patient can return home with the following Two people to help with walking and/or transfers;A lot of help with bathing/dressing/bathroom;Assistance with cooking/housework;Direct supervision/assist for medications management;Direct supervision/assist for financial management;Assist for transportation   Equipment Recommendations   (TBD next venue)    Recommendations for Other Services       Precautions / Restrictions Precautions Precautions: Fall;Other (comment) Precaution Comments: watch BP Restrictions Weight Bearing  Restrictions: Yes RLE Weight Bearing: Non weight bearing     Mobility  Bed Mobility Overal bed mobility: Needs Assistance Bed Mobility: Supine to Sit     Supine to sit: Min guard     General bed mobility comments: min guard for safety to rise to EOB. Safe control of RLE when scooting to EOB.    Transfers Overall transfer level: Needs assistance Equipment used: Rolling walker (2 wheels) Transfers: Sit to/from Stand, Bed to chair/wheelchair/BSC Sit to Stand: Mod assist Stand pivot transfers: Mod assist         General transfer comment: Mod assist for boost and balance with therapist assist to maintain NWB on RLE 50% of the time. Pt able to bear weight through UEs and pivot on LLE but needs cues to prevent RLE from touching floor. Mod assist for pivot. Became dizzy and needed to sit before BP finished.    Ambulation/Gait                   Stairs             Wheelchair Mobility    Modified Rankin (Stroke Patients Only)       Balance Overall balance assessment: Needs assistance Sitting-balance support: No upper extremity supported, Feet unsupported Sitting balance-Leahy Scale: Good     Standing balance support: Reliant on assistive device for balance, Bilateral upper extremity supported Standing balance-Leahy Scale: Poor                              Cognition Arousal/Alertness: Awake/alert Behavior During Therapy: WFL for tasks assessed/performed Overall Cognitive Status: Impaired/Different from baseline Area of Impairment: Memory, Problem solving                     Memory: Decreased recall of precautions,  Decreased short-term memory       Problem Solving: Requires verbal cues General Comments: Needs assist for RLE precautions, poor recall        Exercises      General Comments General comments (skin integrity, edema, etc.): BP sitting EOB 140/64 HR 64; Unable to obtain in standing due to dizziness and need to sit.  After returning to seated position in recliner BP 101/52 HR 65.      Pertinent Vitals/Pain Pain Assessment Pain Assessment: No/denies pain    Home Living                          Prior Function            PT Goals (current goals can now be found in the care plan section) Acute Rehab PT Goals Patient Stated Goal: none PT Goal Formulation: With patient Time For Goal Achievement: 09/10/22 Potential to Achieve Goals: Good Progress towards PT goals: Progressing toward goals    Frequency    Min 3X/week      PT Plan Current plan remains appropriate    Co-evaluation              AM-PAC PT "6 Clicks" Mobility   Outcome Measure  Help needed turning from your back to your side while in a flat bed without using bedrails?: A Little Help needed moving from lying on your back to sitting on the side of a flat bed without using bedrails?: A Little Help needed moving to and from a bed to a chair (including a wheelchair)?: A Lot Help needed standing up from a chair using your arms (e.g., wheelchair or bedside chair)?: A Lot Help needed to walk in hospital room?: Total Help needed climbing 3-5 steps with a railing? : Total 6 Click Score: 12    End of Session Equipment Utilized During Treatment: Gait belt Activity Tolerance: Treatment limited secondary to medical complications (Comment) (BP drop) Patient left: with call bell/phone within reach;in chair;with chair alarm set;with nursing/sitter in room;with SCD's reapplied Nurse Communication: Mobility status PT Visit Diagnosis: Muscle weakness (generalized) (M62.81);History of falling (Z91.81);Difficulty in walking, not elsewhere classified (R26.2);Repeated falls (R29.6)     Time: 1610-9604 PT Time Calculation (min) (ACUTE ONLY): 17 min  Charges:  $Therapeutic Activity: 8-22 mins                     Kathlyn Sacramento, PT, DPT Physical Therapist Acute Rehabilitation Services [facility] [phone]    Berton Mount 08/30/2022, 4:16 PM

## 2022-08-30 NOTE — Plan of Care (Signed)
  Problem: Education: Goal: Knowledge of General Education information will improve Description: Including pain rating scale, medication(s)/side effects and non-pharmacologic comfort measures Outcome: Progressing   Problem: Health Behavior/Discharge Planning: Goal: Ability to manage health-related needs will improve Outcome: Progressing   Problem: Clinical Measurements: Goal: Will remain free from infection Outcome: Progressing   Problem: Safety: Goal: Ability to remain free from injury will improve Outcome: Progressing   Problem: Skin Integrity: Goal: Risk for impaired skin integrity will decrease Outcome: Progressing   

## 2022-08-30 NOTE — Care Management Important Message (Signed)
Important Message  Patient Details  Name: Danuel Burkes MRN: [id number] Date of Birth: 30-Aug-1940   Medicare Important Message Given:  Yes     Sherilyn Banker 08/30/2022, 11:33 AM

## 2022-08-30 NOTE — TOC Progression Note (Addendum)
Transition of Care [facility]) - Progression Note    Patient Details  Name: Wayne [name] MRN: [id number] Date of Birth: January 02, 1941  [facility] CM/SW Contact  Lorri Frederick, LCSW Phone Number: 08/30/2022, 8:36 AM  Clinical Narrative:   CSW spoke with Kerri/[facility]--she is reviewing referral, insurance likely to only pay for 2 week stay, needs to know plan for rest of 6 weeks of IV abx.  CSW also informed her of tunneled central line--she will discuss with DON.   CSW spoke with [name] who reports that money is not an issue and they could private pay.  [facility] Center informed.   0930: [facility] does offer bed, can accept pt today if auth approved. Pt and [name] informed, they do accept this bed.  Auth request submitted in [place].  1420: Auth remains pending in [place].  1510: Auth approved in [place]: [id number], [id number], 3 days: 6/3-6/5.  MD notified.    Expected Discharge Plan:  (TBD) Barriers to Discharge: Continued Medical Work up  Expected Discharge Plan and Services In-house Referral: Clinical Social Work   Post Acute Care Choice:  (TBD) Living arrangements for the past 2 months: Single Family Home                                       Social Determinants of Health (SDOH) Interventions SDOH Screenings   Food Insecurity: No Food Insecurity (05/22/2022)  Housing: Low Risk  (05/22/2022)  Transportation Needs: No Transportation Needs (05/22/2022)  Utilities: Not At Risk (05/22/2022)  Depression (PHQ2-9): Low Risk  (08/19/2022)  Financial Resource Strain: Low Risk  (05/05/2022)  Physical Activity: Inactive (05/05/2022)  Social Connections: Socially Isolated (05/05/2022)  Stress: No Stress Concern Present (05/05/2022)  Tobacco Use: Medium Risk (08/28/2022)    Readmission Risk Interventions    11/12/2021    2:59 PM  Readmission Risk Prevention Plan  Post Dischage Appt Complete  Medication Screening Complete  Transportation Screening Complete

## 2022-08-30 NOTE — Progress Notes (Signed)
Occupational Therapy Treatment Patient Details Name: Baron Steinback MRN: [id number] DOB: 06/21/1940 Today's Date: 08/30/2022   History of present illness 82 y.o. male admitted 5/28 with Rt foot osteomyelitis. 5/30 s/p Tendo Achilles lengthening right and Transmetatarsal amputation right.   PM Hof type 2 diabetes, dyslipidemia, peripheral artery disease.   OT comments  Patient with fair progress toward patient focused goals.  Patient did not have any dizziness this date, but is unable to maintain his NWB to R foot.  Patient needing Mod A for lower body ADL from a sit to stand level, and Mod A for basic stand pivot transfer.  Patient does not have the needed 24 hour assist at home, beyond the fact that he will need further training to maintain NWB, so Patient will benefit from continued inpatient follow up therapy, <3 hours/day.  OT will follow in the acute setting to address deficits.   Recommendations for follow up therapy are one component of a multi-disciplinary discharge planning process, led by the attending physician.  Recommendations may be updated based on patient status, additional functional criteria and insurance authorization.    Assistance Recommended at Discharge Frequent or constant Supervision/Assistance  Patient can return home with the following  A lot of help with walking and/or transfers;A lot of help with bathing/dressing/bathroom;Assistance with cooking/housework;Direct supervision/assist for financial management;Direct supervision/assist for medications management;Assist for transportation   Equipment Recommendations       Recommendations for Other Services      Precautions / Restrictions Precautions Precautions: Fall;Other (comment) Precaution Comments: watch BP Restrictions Weight Bearing Restrictions: Yes RLE Weight Bearing: Non weight bearing       Mobility Bed Mobility Overal bed mobility: Needs Assistance Bed Mobility: Supine to Sit     Supine to  sit: Min guard          Transfers Overall transfer level: Needs assistance Equipment used: Rolling walker (2 wheels) Transfers: Sit to/from Stand, Bed to chair/wheelchair/BSC Sit to Stand: Mod assist     Step pivot transfers: Mod assist, Max assist     General transfer comment: unable to maintain NWB     Balance Overall balance assessment: Needs assistance Sitting-balance support: Single extremity supported Sitting balance-Leahy Scale: Good     Standing balance support: Reliant on assistive device for balance Standing balance-Leahy Scale: Poor                             ADL either performed or assessed with clinical judgement   ADL               Lower Body Bathing: Sit to/from stand;Moderate assistance   Upper Body Dressing : Set up;Sitting   Lower Body Dressing: Moderate assistance;Sit to/from stand   Toilet Transfer: Maximal assistance;Stand-pivot;Rolling walker (2 wheels)                  Extremity/Trunk Assessment Upper Extremity Assessment Upper Extremity Assessment: Generalized weakness   Lower Extremity Assessment Lower Extremity Assessment: Defer to PT evaluation                          Cognition Arousal/Alertness: Awake/alert Behavior During Therapy: WFL for tasks assessed/performed Overall Cognitive Status: Impaired/Different from baseline Area of Impairment: Memory, Problem solving                     Memory: Decreased recall of precautions, Decreased short-term memory  Problem Solving: Requires verbal cues                             Pertinent Vitals/ Pain       Pain Assessment Pain Assessment: No/denies pain Pain Intervention(s): Monitored during session                                                          Frequency  Min 2X/week        Progress Toward Goals  OT Goals(current goals can now be found in the care plan section)  Progress  towards OT goals: Progressing toward goals  Acute Rehab OT Goals Patient Stated Goal: Go home OT Goal Formulation: With patient/family Time For Goal Achievement: 09/10/22 Potential to Achieve Goals: Good  Plan Discharge plan remains appropriate    Co-evaluation                 AM-PAC OT "6 Clicks" Daily Activity     Outcome Measure   Help from another person eating meals?: None Help from another person taking care of personal grooming?: A Little Help from another person toileting, which includes using toliet, bedpan, or urinal?: A Lot Help from another person bathing (including washing, rinsing, drying)?: A Lot Help from another person to put on and taking off regular upper body clothing?: A Little Help from another person to put on and taking off regular lower body clothing?: A Lot 6 Click Score: 16    End of Session Equipment Utilized During Treatment: Gait belt;Rolling walker (2 wheels)  OT Visit Diagnosis: Unsteadiness on feet (R26.81);Muscle weakness (generalized) (M62.81);History of falling (Z91.81)   Activity Tolerance Patient tolerated treatment well   Patient Left with call bell/phone within reach;in chair;with chair alarm set   Nurse Communication Mobility status        Time: 1610-9604 OT Time Calculation (min): 22 min  Charges: OT General Charges $OT Visit: 1 Visit OT Treatments $Self Care/Home Management : 8-22 mins  08/30/2022  RP, OTR/L  Acute Rehabilitation Services  Office:  [phone]   Suzanna Obey 08/30/2022, 9:46 AM

## 2022-08-30 NOTE — Progress Notes (Signed)
PHARMACY CONSULT NOTE FOR:  OUTPATIENT  PARENTERAL ANTIBIOTIC THERAPY (OPAT)  Informational only as the patient is planning to discharge to SNF  Indication: R-foot osteo Regimen: Zosyn 3.375g IV every 6 hours (as an intermittent infusion over 30 minutes) End date: 10/07/22  IV antibiotic discharge orders are pended. To discharging provider:  please sign these orders via discharge navigator,  Select New Orders & click on the button choice - Manage This Unsigned Work.     Thank you for allowing pharmacy to be a part of this patient's care.  Georgina Pillion, PharmD, BCPS Infectious Diseases Clinical Pharmacist 08/30/2022 3:19 PM   **Pharmacist phone directory can now be found on [email or web address] ([email or web address]).  Listed under [facility].

## 2022-08-30 NOTE — Discharge Summary (Addendum)
Physician Discharge Summary   Patient: Wayne [name] MRN: [id number] DOB: 11-Nov-1940  Admit date:     08/24/2022  Discharge date: 09/01/22  Discharge Physician: Marguerita Merles, DO   PCP: Sonny Masters, FNP   Recommendations at discharge:   With PCP within 1 to 2 weeks,  repeat CBC, CMP, mag, Phos within 1 week Follow-up with podiatry in outpatient setting within 1 to 2 weeks Follow-up with infectious disease in outpatient setting 5 to 6 weeks and have IJ PICC line and pulled after completion of his antibiotics.  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 [facility] Course: The patient is an 82 year old Caucasian male with a past medical history significant for but limited to diabetes mellitus type 2, neuropathy, PAD, hypertension, hyperlipidemia, CKD stage IIIa, subdural hematoma, chronic diastolic CHF, CAD as well as other comorbidities who presented with right foot osteomyelitis and pain.  He has been having ongoing right foot pain and he had a previous right fifth metatarsal amputation and has been following with wound care, infectious disease and podiatry in outpatient setting.  He been on 8-week course of Augmentin and ciprofloxacin and follow-up with his podiatrist yesterday and there is concern for osteomyelitis which was confirmed on the x-ray in the office and there is also new fracture noted.  He was sent to the ED for IV antibiotics and admission for surgical  intervention.  In the ED he was noted to have elevated blood pressures and heart rate in the 50s.  Lab workup was revealing BUN/creatinine of 35/1.35.  Blood cultures obtained and right foot x-rays were done in the podiatry office were not currently visible or reported.    Podiatry recommended MRI and antibiotics and he was started on IV Zosyn.  ID has been consulted and they are recommending continue IV Zosyn pending further surgical intervention and patient underwent TMA of his right foot on 08/26/2022.  He is postoperative day 5 and remains a little orthostatic but Renal Fxn improved.  OPAT orders being placed. If orthostatic VS stable and he does ok he is Medically stable for D/C to SNF.  He will need to follow-up with PCP, ID as well as podiatry in outpatient setting.  Patient to follow-up with [facility] in 5 to 6 weeks and tunneled PICC line will need to be pulled at the completion of his IV antibiotics.  **Unfortunately prior to discharge patient had some orthostatic hypotension and so his discharge was canceled yesterday.  Again he continues to have orthostatic hypotension especially from his sitting to standing and blood pressure dropped significantly.  Will give another 1 L bolus and ensure that he has abdominal binder and TED hose.  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 -Has had ongoing foot pain and was at podiatry's office today for debridement/follow-up.  Concern for right fourth metatarsal changes. -X-ray in the office reportedly confirmed fracture and osteomyelitis.  Patient sent to the ED for IV antibiotics and possible surgical  intervention. -MRI done and showed " Interval amputation of the 5th ray through the base of the 5th metatarsal. 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 Probable early osteomyelitis involving the head of the 3rd metatarsal, the proximal 4th phalanx and distal half of the remaining 5th metatarsal. Soft tissue ulceration lateral to the 4th metatarsal with surrounding inflammation. No focal fluid collection. Atrophy and T2 hyperintensity throughout the forefoot musculature." -Patient started on Zosyn in the ED, podiatry was consulted and patient was taken to the OR on 08/26/2022 and had a bone biopsy of the fourth metatarsal as well as a transmetatarsal amputation on the right and tendo Achilles lengthening; per Dr. Logan Bores he does not suspect clean osseous margins postoperatively and will need to continue IV antibiotics for 6 weeks and OPAT orders are being placed -Continue to monitor on telemetry overnight -Appreciate podiatry recommendations and assistance -Continue with IV Zosyn and given his poor chance of healing we have consulted infectious diseases for further antibiotic management -ID consulted and recommending continuing IV Zosyn pending further surgical evaluation and they believe that the patient exhausted all his efforts at limb salvage progression with osteomyelitis suspected prolonged antibiotic therapy and attempt at limb salvage and 1 amputation November 2024.  They feel that he needs a more definitive surgery at this time and recommending having the patient consider a BKA but patient is undergoing a TMA for further attempted foot salvage and prevention of BKA; unfortunately patient did not have clean margins but ID is now recommending continue antibiotics for 6 weeks and recommending IV Zosyn and following of the cultures and pathology.  They recommend IV antibiotics and they feel he has failed oral antibiotics.  Given his  renal function they are requesting a central Cathter for Prolonged IV Access so have asked IR to Place to try and preserve arm veins in case of future hemodialysis/fistula/graft placement requirement -Trend fever curve and WBC -Follow-up blood cultures and intraoperative cultures and ID feels that the duration of postoperative Abx will be at least 6 weeks given that margins were suspected not to be clear.  He now has a IJ central PICC -Now has an incisional wound VAC in place with no complications and dressings are left in place and podiatry recommends leaving the incisional wound VAC in place until the patient is ready for discharge to rehab facility; the plan will then to be to discharge the patient to rehab and place orders for wound care as he is will be nonweightbearing on the surgical extremity -Bowel Regimen initiated we will start him on MiraLAX 17 g twice daily and senna docusate 1 tab p.o. twice daily and Bisacodyl 10 mg rectal suppository daily as needed   PAD -Known history of PAD.  Did have angiogram in August 2023 with runoff.   -This was notable for right pedal circulation provided only by posterior tibial artery.  This is relevant to healing in the setting of osteomyelitis above. -Had previously been on aspirin and do not see this on patient's medication list currently -Saw Dr. [name] of Vascular surgery and has examined been worsened last September.  His angiography showed no options for reconstruction  and he did have an inflow to the ankle via the peroneal only runoff which had robust arborization in the posterior tibial artery.  Fortunately there is no improvement chances is healing from a vascular standpoint at that time.   Diabetes Mellitus Type 2 -Currently holding Glipizide 5 mg p.o. daily before breakfast and Dapagliflozin 10 mg po Daily -Most recent HbA1c was 5.9 -Continue Sensitive Novolog SSI AC -Continue to Monitor CBG's per Protocol; CBG Trend: Recent Labs  Lab  08/30/22 0732 08/30/22 1115 08/30/22 1643 08/30/22 1933 08/31/22 0736 08/31/22 1142 08/31/22 1626  GLUCAP 113* 171* 169* 246* 137* 161* 164*    Hypertension -> Hypotension and Orthostatic VS -Continued home Furosemide 40 mg po Daily and Metoprolol Succinate 12.5 mg po Daily we will now discontinue furosemide and discontinue the metoprolol -Continue to Monitor BP per Protocol -Last BP Reading was 171/69 but was again symptomatic and had Orthostatic Hypotension -Adjusted BP Meds as necessary -Will give bolus and continue TED hose and abdominal binder   Hyperlipidemia -Do not currently see patient on any medications for this on MAR  Hypokalemia -Patient's K+ Level Trend: Recent Labs  Lab 08/25/22 0421 08/26/22 0226 08/27/22 1052 08/28/22 0127 08/29/22 0158 08/30/22 0307 08/31/22 0229  K 4.6 3.8 3.7 3.5 3.5 3.4* 3.9  -Continue to Monitor and Replete as Necessary -Repeat CMP in the AM   Hypophosphatemia -Phos Level Trend: Recent Labs  Lab 08/26/22 0226 08/27/22 1052 08/28/22 0127 08/29/22 0158 08/30/22 0307 08/31/22 0229  PHOS 4.2 3.9 3.7 2.9 2.2* 2.7  -Replete with po K Phos Neutral 500 mg po BID -Continue to Monitor and Replete as Necessary -Repeat Phos Level in the AM   Chronic Diastolic CHF  -Echo earlier this year with EF 50-55%, G2 DD, normal RV function. -Continue home Furosemide 40 mg po Daily and Metoprolol Succinate 12.5 mg po Daily; Currently holding Dapaglifozin 10 mg po Daily  -Strict I's and O's and Daily Weights.  Intake/Output Summary (Last 24 hours) at 08/31/2022 1705 Last data filed at 08/31/2022 1600 Gross per 24 hour  Intake 1216.93 ml  Output 1250 ml  Net -33.07 ml  -IVF hydration now stopped and can resume Lasix at D/C however he still remains orthostatic so we will give him a 1 L bolus currently -Continue to Monitor for S/Sx of Volume Overload  Orthostatic Hypotension -BP dropped and patient had a near syncopal episode today; today's blood  pressure dropped as low as 87/49 after sitting at the edge of the bed for almost 8 minutes and perform exercises but he had no symptoms.  When his return to supine his blood pressure improved to 132/56 -Continue Hold Lasix for now but resume at D/C -Initiate IV fluid hydration with normal saline at 75 MLS per hour for 1 day but this now been discontinued stopped.  Order TED Hose (on the Left) and Abdominal Binder -Repeat Orthostatic VS show that he dropped significantly and was symptomatic and went from 183/75 lying 162/67 sitting and then 121/48 standing and then 108/58 standing 3 minutes -Continue PT/OT to Evaluate and Treat and recommending SNF -See blood pressure reading as above   CAD -Chart review shows history of angioplasty. -Recently seen Dr. Antoine Poche with cardiology and he had an echocardiogram in February of this year which showed an EF of 50 to 55% -Holding both Metoprolol Succinate 12.5 mg po Daily and holding Lasix now given orthostasis   Normocytic Anemia -Likely dilutional and Post-Operative Drop -Hgb/Hct Trend: Recent Labs  Lab 08/25/22 0421  08/26/22 0226 08/27/22 1052 08/28/22 0127 08/29/22 0158 08/30/22 0307 08/31/22 0229  HGB 15.1 14.3 13.3 11.9* 11.6* 11.1* 11.4*  HCT 45.7 42.7 41.1 36.6* 35.8* 34.4* 35.1*  MCV 85.3 82.3 85.1 84.3 84.4 85.4 86.0  -Checked Anemia Panel and showed an iron level of 23, UIBC 176, TIBC 199, saturation ratios of 12%, ferritin level 152, v folate level 11.9 and a vitamin B12 of 273 -Continue to Monitor for S/Sx of Bleeding; No overt bleeding noted -Repeat CBC in the AM    AKI on CKD Stage 3a, improved  -BUN/Cr Trend: Recent Labs  Lab 08/25/22 0421 08/26/22 0226 08/27/22 1052 08/28/22 0127 08/29/22 0158 08/30/22 0307 08/31/22 0229  BUN 31* 35* 32* 33* 22 19 23   CREATININE 1.52* 1.83* 1.80* 1.68* 1.46* 1.18 1.22  -Avoid Nephrotoxic Medications, Contrast Dyes, Hypotension and Dehydration to Ensure Adequate Renal Perfusion and  will need to Renally Adjust Meds -Was given IV Lasix and now changed back to oral Lasix but will hold due to Orthostatic Hypotension and he received 1 L bolus -Continue to Monitor and Trend Renal Function carefully and repeat CMP in the AM    Hypoalbuminemia -Patient's Albumin Trend: Recent Labs  Lab 08/25/22 0421 08/26/22 0226 08/27/22 1052 08/28/22 0127 08/29/22 0158 08/30/22 0307 08/31/22 0229  ALBUMIN 3.3* 3.4* 3.3* 3.0* 2.9* 2.6* 2.5*  -Continue to Monitor and Trend and repeat CMP in the AM  Severe malnutrition in the context of chronic illness Nutrition Status: Nutrition Problem: Severe Malnutrition Etiology: chronic illness Signs/Symptoms: severe fat depletion, severe muscle depletion   -Nutrition recently been consulted and recommending Nepro shakes p.o. twice daily as well as Prosource +30 mL p.o. twice daily as well as a multivitamin p.o. daily, vitamin C 500 mg p.o. twice daily, zinc to 20 mg p.o. daily for 14 days  -The dietitian feels the patient is a high refeeding risk and recommending monitoring potassium magnesium and phosphorus labs daily until stable and also when the patient daily -Patient continues to have poor po intake  Tinea Cruris -Diflucan Weekly Treatment (On Thursdays) for 4 weeks total and Nystatin Cream  Nutrition Documentation    Flowsheet Row ED to Hosp-Admission (Current) from 08/24/2022 in [facility]  Nutrition Problem Severe Malnutrition  Etiology chronic illness  Nutrition Goal Patient will meet greater than or equal to 90% of their needs      Consultants: Podiatry Infectious Diseases  IR Procedures performed: As delineated as above   Disposition: Skilled nursing facility Diet recommendation:  Cardiac and Carb modified diet DISCHARGE MEDICATION: Allergies as of 09/01/2022   No Known Allergies      Medication List     STOP taking these medications    amoxicillin-clavulanate 875-125 MG  tablet Commonly known as: AUGMENTIN   ciclopirox 8 % solution Commonly known as: Penlac   ciprofloxacin 750 MG tablet Commonly known as: CIPRO   tamsulosin 0.4 MG Caps capsule Commonly known as: FLOMAX       TAKE these medications    Accu-Chek Softclix Lancets lancets Use 4 times daily as directed to check blood sugars. What changed:  how much to take how to take this additional instructions   acetaminophen 325 MG tablet Commonly known as: TYLENOL Take 2 tablets (650 mg total) by mouth every 6 (six) hours as needed for mild pain (or Fever >/= 101).   ascorbic acid 500 MG tablet Commonly known as: VITAMIN C Take 1 tablet (500 mg total) by mouth 2 (two) times  daily.   bisacodyl 10 MG suppository Commonly known as: DULCOLAX Place 1 suppository (10 mg total) rectally daily as needed for moderate constipation.   blood glucose meter kit and supplies Kit Dispense based on patient and insurance preference. Use up to four times daily as directed. What changed:  how much to take how to take this when to take this   Accu-Chek Guide w/Device Kit Use up to four times daily as directed. What changed:  how much to take how to take this when to take this   dapagliflozin propanediol 10 MG Tabs tablet Commonly known as: FARXIGA Take 1 tablet (10 mg total) by mouth daily.   (feeding supplement) PROSource Plus liquid Take 30 mLs by mouth 2 (two) times daily between meals.   feeding supplement (NEPRO CARB STEADY) Liqd Take 237 mLs by mouth 2 (two) times daily between meals.   fluconazole 150 MG tablet Commonly known as: DIFLUCAN Take 1 tablet (150 mg total) by mouth once a week for 3 doses. Start taking on: September 02, 2022   furosemide 40 MG tablet Commonly known as: LASIX Take 1 tablet (40 mg total) by mouth daily.   glipiZIDE 5 MG tablet Commonly known as: GLUCOTROL TAKE 1 TABLET BY MOUTH EVERY DAY BEFORE BREAKFAST What changed: See the new instructions.   glucose  blood test strip Use as instructed What changed:  how much to take how to take this when to take this   Accu-Chek Guide test strip Generic drug: glucose blood Use 4 times daily as directed to check bloos sugars. What changed:  how much to take how to take this when to take this   metoprolol succinate 25 MG 24 hr tablet Commonly known as: TOPROL-XL TAKE 1/2 TABLET BY MOUTH DAILY What changed:  how much to take when to take this additional instructions   multivitamin with minerals Tabs tablet Take 1 tablet by mouth daily.   nystatin cream Commonly known as: MYCOSTATIN Apply topically 2 (two) times daily.   piperacillin-tazobactam  IVPB Commonly known as: ZOSYN Inject 3.375 g into the vein every 6 (six) hours. Indication:  R-foot osteo First Dose: Yes Last Day of Therapy:  10/07/22 Labs - Once weekly:  CBC/D and BMP, ESR and CRP Method of administration: Intermittent infusions over 30 minutes Pull PICC line at the completion of IV antibiotics Method of administration may be changed at the discretion of home infusion pharmacist based upon assessment of the patient and/or caregiver's ability to self-administer the medication ordered.   polyethylene glycol 17 g packet Commonly known as: MIRALAX / GLYCOLAX Take 17 g by mouth 2 (two) times daily.   senna-docusate 8.6-50 MG tablet Commonly known as: Senokot-S Take 1 tablet by mouth 2 (two) times daily.   zinc sulfate 220 (50 Zn) MG capsule Take 1 capsule (220 mg total) by mouth daily. For 14 days               Home Infusion Instuctions  (From admission, onward)           Start     Ordered   08/30/22 0000  Home infusion instructions       Question:  Instructions  Answer:  Flushing of vascular access device: 0.9% NaCl pre/post medication administration and prn patency; Heparin 100 u/ml, 5ml for implanted ports and Heparin 10u/ml, 5ml for all other central venous catheters.   08/30/22 1525               Discharge Care Instructions  (  From admission, onward)           Start     Ordered   09/01/22 0000  Discharge wound care:       Comments: Follow up Wound care in SNF.   09/01/22 1131   08/30/22 0000  Discharge wound care:       Comments: Per Podiatry   08/30/22 1527            Discharge Exam: Filed Weights   08/24/22 1053 08/25/22 0500 08/30/22 0500  Weight: 78.9 kg 76.7 kg 80 kg   Vitals:   09/01/22 0735 09/01/22 1029  BP: (!) 175/74   Pulse: 62   Resp:    Temp: 97.7 F (36.5 C)   SpO2: 94% 98%   Examination: Physical Exam:   Constitutional: Elderly chronically ill-appearing Caucasian male in no acute distress Respiratory: Diminished to auscultation bilaterally with coarse breath sounds, no wheezing, rales, rhonchi or crackles. Normal respiratory effort and patient is not tachypenic. No accessory muscle use.  Unlabored breathing Cardiovascular: RRR, no murmurs / rubs / gallops. S1 and S2 auscultated.  Has a right foot TMA now Abdomen: Soft, non-tender, non-distended. Bowel sounds positive.  GU: Deferred. Musculoskeletal: No clubbing / cyanosis of digits/nails.  Has a right foot TMA Skin: No rashes, lesions, ulcers on limited skin evaluation. No induration; Warm and dry.  Neurologic: CN 2-12 grossly intact with no focal deficits. Romberg sign and cerebellar reflexes not assessed.  Psychiatric: He is awake and alert and appears calm  Condition at discharge: stable  The results of significant diagnostics from this hospitalization (including imaging, microbiology, ancillary and laboratory) are listed below for reference.   Imaging Studies: IR Fluoro Guide CV Line Right  Result Date: 08/27/2022 INDICATION: Osteomyelitis, access for long-term IV antibiotics EXAM: TUNNELED PICC LINE WITH ULTRASOUND AND FLUOROSCOPIC GUIDANCE MEDICATIONS: 1% lidocaine local. ANESTHESIA/SEDATION: None. FLUOROSCOPY: Radiation Exposure Index (as provided by the fluoroscopic device): 1.0  mGy Kerma COMPLICATIONS: None immediate. PROCEDURE: Informed written consent was obtained from the patient after a discussion of the risks, benefits, and alternatives to treatment. Questions regarding the procedure were encouraged and answered. The right neck and chest were prepped with chlorhexidine in a sterile fashion, and a sterile drape was applied covering the operative field. Maximum barrier sterile technique with sterile gowns and gloves were used for the procedure. A timeout was performed prior to the initiation of the procedure. After creating a small venotomy incision, a micropuncture kit was utilized to access the right internal jugular vein under direct, real-time ultrasound guidance after the overlying soft tissues were anesthetized with 1% lidocaine with epinephrine. Ultrasound image documentation was performed. The microwire was kinked to measure appropriate catheter length. The micropuncture sheath was exchanged for a peel-away sheath over a guidewire. A 5 French dual lumen tunneled PICC measuring 29 cm was tunneled in a retrograde fashion from the anterior chest wall to the venotomy incision. The catheter was then placed through the peel-away sheath with tip ultimately positioned at the superior caval-atrial junction. Final catheter positioning was confirmed and documented with a spot radiographic image. The catheter aspirates and flushes normally. The catheter was flushed with appropriate volume heparin dwells. The catheter exit site was secured with a 2-0 Ethilon retention suture. The venotomy incision was closed with Dermabond. Dressings were applied. The patient tolerated the procedure well without immediate post procedural complication. FINDINGS: After catheter placement, the tip lies within the superior cavoatrial junction. The catheter aspirates and flushes normally and is ready for immediate  use. IMPRESSION: Successful placement of 29cm dual lumen tunneled PICC catheter via the right  internal jugular vein with tip terminating at the superior caval atrial junction. The catheter is ready for immediate use. Electronically Signed   By: Judie Petit.  Shick M.D.   On: 08/27/2022 17:11   IR US Guide Vasc Access Right  Result Date: 08/27/2022 INDICATION: Osteomyelitis, access for long-term IV antibiotics EXAM: TUNNELED PICC LINE WITH ULTRASOUND AND FLUOROSCOPIC GUIDANCE MEDICATIONS: 1% lidocaine local. ANESTHESIA/SEDATION: None. FLUOROSCOPY: Radiation Exposure Index (as provided by the fluoroscopic device): 1.0 mGy Kerma COMPLICATIONS: None immediate. PROCEDURE: Informed written consent was obtained from the patient after a discussion of the risks, benefits, and alternatives to treatment. Questions regarding the procedure were encouraged and answered. The right neck and chest were prepped with chlorhexidine in a sterile fashion, and a sterile drape was applied covering the operative field. Maximum barrier sterile technique with sterile gowns and gloves were used for the procedure. A timeout was performed prior to the initiation of the procedure. After creating a small venotomy incision, a micropuncture kit was utilized to access the right internal jugular vein under direct, real-time ultrasound guidance after the overlying soft tissues were anesthetized with 1% lidocaine with epinephrine. Ultrasound image documentation was performed. The microwire was kinked to measure appropriate catheter length. The micropuncture sheath was exchanged for a peel-away sheath over a guidewire. A 5 French dual lumen tunneled PICC measuring 29 cm was tunneled in a retrograde fashion from the anterior chest wall to the venotomy incision. The catheter was then placed through the peel-away sheath with tip ultimately positioned at the superior caval-atrial junction. Final catheter positioning was confirmed and documented with a spot radiographic image. The catheter aspirates and flushes normally. The catheter was flushed with  appropriate volume heparin dwells. The catheter exit site was secured with a 2-0 Ethilon retention suture. The venotomy incision was closed with Dermabond. Dressings were applied. The patient tolerated the procedure well without immediate post procedural complication. FINDINGS: After catheter placement, the tip lies within the superior cavoatrial junction. The catheter aspirates and flushes normally and is ready for immediate use. IMPRESSION: Successful placement of 29cm dual lumen tunneled PICC catheter via the right internal jugular vein with tip terminating at the superior caval atrial junction. The catheter is ready for immediate use. Electronically Signed   By: Judie Petit.  Shick M.D.   On: 08/27/2022 17:11   DG Foot Complete Right  Result Date: 08/26/2022 CLINICAL DATA:  Status post transmetatarsal amputation EXAM: RIGHT FOOT COMPLETE - 3+ VIEW COMPARISON:  08/24/2018 FINDINGS: There is been interval transmetatarsal amputation. Multiple surgical staples are seen. No acute bony abnormality is noted. Calcaneal spurring is seen. Wound VAC is noted laterally. 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 Electronically Signed   By: Alcide Clever M.D.   On: 08/26/2022 21:10   DG MINI C-ARM IMAGE ONLY  Result Date: 08/26/2022 There is no interpretation for this exam.  This order is for images obtained during a surgical procedure.  Please See "Surgeries" Tab for more information regarding the procedure.   DG Foot Complete Right  Result Date: 08/26/2022 Please see detailed radiograph report in office note.  MR FOOT RIGHT WO CONTRAST  Result Date: 08/25/2022 CLINICAL DATA:  Osteomyelitis of the foot. EXAM: MRI OF THE RIGHT FOREFOOT WITHOUT CONTRAST TECHNIQUE: Multiplanar, multisequence MR imaging of the right forefoot was performed. No intravenous contrast was administered. COMPARISON:  Radiographs 06/17/2022 and 08/24/2022.  MRI 11/12/2021. FINDINGS: Bones/Joint/Cartilage Since the  previous MRI, the patient  has undergone amputation of the 5th ray through the base of the 5th metatarsal. As shown on the recent radiographs, there is new osteomyelitis involving the distal half of the 4th metatarsal with a pathologic fracture of the metatarsal neck. The 4th metatarsal head is displaced inferolaterally and appears exposed. In addition, there is mild T2 hyperintensity and mildly decreased T1 signal within the proximal 4th phalanx, the head of the 3rd metatarsal as well as the distal half of the remaining 5th metatarsal. No significant osseous findings within the 1st or 2nd rays. The alignment is normal at the Lisfranc joint. The visualized tarsal bones appear unremarkable. No significant joint effusions. Ligaments Intact Lisfranc ligament. The collateral ligaments of the 1st through 3rd metatarsophalangeal joints appear intact. 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 No forefoot tendon rupture or significant tenosynovitis demonstrated. Soft tissues Soft tissue ulceration lateral to the 4th metatarsal with surrounding inflammation. No focal fluid collection. IMPRESSION: 1. 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 2. 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 3. Probable early osteomyelitis involving the head of the 3rd metatarsal, the proximal 4th phalanx and distal half of the remaining 5th metatarsal. 4. Soft tissue ulceration lateral to the 4th metatarsal with surrounding inflammation. No focal fluid collection. 5. Atrophy and T2 hyperintensity throughout the forefoot musculature. Electronically Signed   By: Carey Bullocks M.D.   On: 08/25/2022 08:01    Microbiology: Results for orders placed or performed during the hospital encounter of 08/24/22  Blood Cultures x 2 sites     Status: None   Collection  Time: 08/24/22 10:56 AM   Specimen: BLOOD RIGHT FOREARM  Result Value Ref Range Status   Specimen Description BLOOD RIGHT FOREARM  Final   Special Requests   Final    BOTTLES DRAWN AEROBIC AND ANAEROBIC Blood Culture adequate volume   Culture   Final    NO GROWTH 5 DAYS Performed at [facility], [address] [place], [place] [address]    Report Status 08/29/2022 FINAL  Final  Blood Cultures x 2 sites     Status: None   Collection Time: 08/24/22  3:54 PM   Specimen: BLOOD  Result Value Ref Range Status   Specimen Description BLOOD RIGHT ANTECUBITAL  Final   Special Requests   Final    BOTTLES DRAWN AEROBIC AND ANAEROBIC Blood Culture adequate volume   Culture   Final    NO GROWTH 5 DAYS Performed at [facility], [address] [place], [place] [address]    Report Status 08/29/2022 FINAL  Final  Aerobic/Anaerobic Culture w Gram Stain (surgical/deep wound)     Status: None   Collection Time: 08/26/22  6:23 PM   Specimen: Soft Tissue, Other  Result Value Ref Range Status   Specimen Description TISSUE  Final   Special Requests RT 4TH METATARSAL  Final   Gram Stain NO WBC SEEN NO ORGANISMS SEEN   Final   Culture   Final    No growth aerobically or anaerobically. Performed at [facility], [address] [place], [place] [address]    Report Status 08/31/2022 FINAL  Final    Labs: CBC: Recent Labs  Lab 08/28/22 0127 08/29/22 0158 08/30/22 0307 08/31/22 0229 09/01/22 0239  WBC 7.7 7.3 6.3 5.6 5.6  NEUTROABS 5.1 4.4 3.7 3.1  3.0  HGB 11.9* 11.6* 11.1* 11.4* 10.8*  HCT 36.6* 35.8* 34.4* 35.1* 33.4*  MCV 84.3 84.4 85.4 86.0 86.8  PLT 178 163 150 167 170   Basic Metabolic Panel: Recent Labs  Lab 08/28/22 0127 08/29/22 0158 08/30/22 0307 08/31/22 0229 09/01/22 0239  NA 136 141 138 142 140  K 3.5 3.5 3.4* 3.9 3.6  CL 103 105 108 109 111  CO2 24 27 21* 24 22  GLUCOSE 166* 125* 131* 156* 164*  BUN 33* 22 19 23 17   CREATININE 1.68* 1.46* 1.18  1.22 1.16  CALCIUM 8.2* 8.3* 8.1* 8.4* 8.3*  MG 2.5* 2.4 2.4 2.1 2.2  PHOS 3.7 2.9 2.2* 2.7 2.4*   Liver Function Tests: Recent Labs  Lab 08/28/22 0127 08/29/22 0158 08/30/22 0307 08/31/22 0229 09/01/22 0239  AST 12* 12* 11* 13* 14*  ALT 9 10 9 9 11   ALKPHOS 64 65 62 62 74  BILITOT 1.0 1.2 0.9 0.6 0.5  PROT 6.2* 6.2* 5.9* 5.8* 5.9*  ALBUMIN 3.0* 2.9* 2.6* 2.5* 2.6*   CBG: Recent Labs  Lab 08/31/22 0736 08/31/22 1142 08/31/22 1626 08/31/22 2131 09/01/22 0735  GLUCAP 137* 161* 164* 167* 137*   Discharge time spent: greater than 30 minutes.  Signed: Marguerita Merles, DO [facility] 09/01/2022

## 2022-08-30 NOTE — Plan of Care (Signed)
[employer] crew and stated that the facility could not accept him today so discharge summary was done pending orthostatics.  OPAT orders were placed.  Unfortunately right prior to discharge he is orthostatic and his blood pressure dropped significantly.  Will hold discharge and give him a 500 mL bolus and start him back on maintenance IV fluids and then repeat orthostatics in the morning.

## 2022-08-31 DIAGNOSIS — E1151 Type 2 diabetes mellitus with diabetic peripheral angiopathy without gangrene: Secondary | ICD-10-CM | POA: Diagnosis not present

## 2022-08-31 DIAGNOSIS — I951 Orthostatic hypotension: Secondary | ICD-10-CM

## 2022-08-31 DIAGNOSIS — M869 Osteomyelitis, unspecified: Secondary | ICD-10-CM | POA: Diagnosis not present

## 2022-08-31 DIAGNOSIS — I739 Peripheral vascular disease, unspecified: Secondary | ICD-10-CM | POA: Diagnosis not present

## 2022-08-31 DIAGNOSIS — N1831 Chronic kidney disease, stage 3a: Secondary | ICD-10-CM | POA: Diagnosis not present

## 2022-08-31 LAB — CBC WITH DIFFERENTIAL/PLATELET
Abs Immature Granulocytes: 0.02 10*3/uL (ref 0.00–0.07)
Basophils Absolute: 0 10*3/uL (ref 0.0–0.1)
Basophils Relative: 0 %
Eosinophils Absolute: 0.3 10*3/uL (ref 0.0–0.5)
Eosinophils Relative: 6 %
HCT: 35.1 % — ABNORMAL LOW (ref 39.0–52.0)
Hemoglobin: 11.4 g/dL — ABNORMAL LOW (ref 13.0–17.0)
Immature Granulocytes: 0 %
Lymphocytes Relative: 29 %
Lymphs Abs: 1.7 10*3/uL (ref 0.7–4.0)
MCH: 27.9 pg (ref 26.0–34.0)
MCHC: 32.5 g/dL (ref 30.0–36.0)
MCV: 86 fL (ref 80.0–100.0)
Monocytes Absolute: 0.5 10*3/uL (ref 0.1–1.0)
Monocytes Relative: 9 %
Neutro Abs: 3.1 10*3/uL (ref 1.7–7.7)
Neutrophils Relative %: 56 %
Platelets: 167 10*3/uL (ref 150–400)
RBC: 4.08 MIL/uL — ABNORMAL LOW (ref 4.22–5.81)
RDW: 13.8 % (ref 11.5–15.5)
WBC: 5.6 10*3/uL (ref 4.0–10.5)
nRBC: 0 % (ref 0.0–0.2)

## 2022-08-31 LAB — GLUCOSE, CAPILLARY
Glucose-Capillary: 137 mg/dL — ABNORMAL HIGH (ref 70–99)
Glucose-Capillary: 161 mg/dL — ABNORMAL HIGH (ref 70–99)
Glucose-Capillary: 164 mg/dL — ABNORMAL HIGH (ref 70–99)
Glucose-Capillary: 167 mg/dL — ABNORMAL HIGH (ref 70–99)

## 2022-08-31 LAB — COMPREHENSIVE METABOLIC PANEL
ALT: 9 U/L (ref 0–44)
AST: 13 U/L — ABNORMAL LOW (ref 15–41)
Albumin: 2.5 g/dL — ABNORMAL LOW (ref 3.5–5.0)
Alkaline Phosphatase: 62 U/L (ref 38–126)
Anion gap: 9 (ref 5–15)
BUN: 23 mg/dL (ref 8–23)
CO2: 24 mmol/L (ref 22–32)
Calcium: 8.4 mg/dL — ABNORMAL LOW (ref 8.9–10.3)
Chloride: 109 mmol/L (ref 98–111)
Creatinine, Ser: 1.22 mg/dL (ref 0.61–1.24)
GFR, Estimated: 59 mL/min — ABNORMAL LOW (ref >60)
Glucose, Bld: 156 mg/dL — ABNORMAL HIGH (ref 70–99)
Potassium: 3.9 mmol/L (ref 3.5–5.1)
Sodium: 142 mmol/L (ref 135–145)
Total Bilirubin: 0.6 mg/dL (ref 0.3–1.2)
Total Protein: 5.8 g/dL — ABNORMAL LOW (ref 6.5–8.1)

## 2022-08-31 LAB — PHOSPHORUS: Phosphorus: 2.7 mg/dL (ref 2.5–4.6)

## 2022-08-31 LAB — MAGNESIUM: Magnesium: 2.1 mg/dL (ref 1.7–2.4)

## 2022-08-31 LAB — AEROBIC/ANAEROBIC CULTURE W GRAM STAIN (SURGICAL/DEEP WOUND)

## 2022-08-31 MED ORDER — FLUCONAZOLE 150 MG PO TABS: 150.0000 mg | ORAL_TABLET | ORAL | Status: AC

## 2022-08-31 MED ORDER — SODIUM CHLORIDE 0.9 % IV BOLUS
1000.0000 mL | Freq: Once | INTRAVENOUS | Status: AC
  Administered 2022-08-31: 1000 mL via INTRAVENOUS

## 2022-08-31 MED ORDER — ZINC SULFATE 220 (50 ZN) MG PO CAPS: 220.0000 mg | ORAL_CAPSULE | Freq: Every day | ORAL | 0 refills | Status: DC

## 2022-08-31 NOTE — Progress Notes (Signed)
PROGRESS NOTE    Wayne [name]  ZOX:[id number] DOB: [birth year] DOA: 08/24/2022 PCP: Sonny Masters, FNP   Brief Narrative:  The patient is an 82 year old Caucasian male with a past medical history significant for but limited to diabetes mellitus type 2, neuropathy, PAD, hypertension, hyperlipidemia, CKD stage IIIa, subdural hematoma, chronic diastolic CHF, CAD as well as other comorbidities who presented with right foot osteomyelitis and pain.  He has been having ongoing right foot pain and he had a previous right fifth metatarsal amputation and has been following with wound care, infectious disease and podiatry in outpatient setting.  He been on 8-week course of Augmentin and ciprofloxacin and follow-up with his podiatrist yesterday and there is concern for osteomyelitis which was confirmed on the x-ray in the office and there is also new fracture noted.  He was sent to the ED for IV antibiotics and admission for surgical intervention.  In the ED he was noted to have elevated blood pressures and heart rate in the 50s.  Lab workup was revealing BUN/creatinine of 35/1.35.  Blood cultures obtained and right foot x-rays were done in the podiatry office were not currently visible or reported.    Podiatry recommended MRI and antibiotics and he was started on IV Zosyn.  ID has been consulted and they are recommending continue IV Zosyn pending further surgical intervention and patient underwent TMA of his right foot on 08/26/2022.  He is postoperative day 5 and remains a little orthostatic but Renal Fxn improved.  OPAT orders being placed. If orthostatic VS stable and he does ok he is Medically stable for D/C to SNF.  He will need to follow-up with PCP, ID as well as podiatry in outpatient setting.  Patient to follow-up with [facility] in 5 to 6 weeks and tunneled PICC line will need to be pulled at the completion of his IV antibiotics.  **Unfortunately prior to discharge patient had some orthostatic  hypotension and so his discharge was canceled yesterday.  Again he continues to have orthostatic hypotension especially from his sitting to standing and blood pressure dropped significantly.  Will give another 1 L bolus and ensure that he has abdominal binder and TED hose.  He continues to drop may need some midodrine as he appears to be clinically euvolemic now   Assessment and Plan:   Osteomyelitis of right fourth metatarsal status post a tendo Achilles lengthening on the right, transmetatarsal amputation on the right and a bone biopsy of the fourth metatarsal in the right POD5 -History of prior foot infection status amputation of right fifth metatarsal. -Has had ongoing foot pain and was at podiatry's office today for debridement/follow-up.  Concern for right fourth metatarsal changes. -X-ray in the office reportedly confirmed fracture and osteomyelitis.  Patient sent to the ED for IV antibiotics and possible surgical intervention. -MRI done and showed " Interval amputation of the 5th ray through the base of the 5th metatarsal. 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 Probable early osteomyelitis involving the head of the 3rd metatarsal, the proximal 4th phalanx and distal half of the remaining 5th metatarsal. Soft tissue ulceration lateral to the 4th metatarsal with surrounding inflammation. No focal fluid collection. Atrophy and T2 hyperintensity throughout the forefoot musculature." -Patient started on Zosyn in the ED, podiatry was consulted and patient was taken to the OR on 08/26/2022 and had a bone biopsy of the fourth metatarsal as well as a transmetatarsal amputation on  the right and tendo Achilles lengthening; per Dr. Logan Bores he does not suspect clean osseous margins postoperatively and will need to continue IV antibiotics for 6 weeks and OPAT orders are being placed -Continue to monitor on telemetry  overnight -Appreciate podiatry recommendations and assistance -Continue with IV Zosyn and given his poor chance of healing we have consulted infectious diseases for further antibiotic management -ID consulted and recommending continuing IV Zosyn pending further surgical evaluation and they believe that the patient exhausted all his efforts at limb salvage progression with osteomyelitis suspected prolonged antibiotic therapy and attempt at limb salvage and 1 amputation November 2024.  They feel that he needs a more definitive surgery at this time and recommending having the patient consider a BKA but patient is undergoing a TMA for further attempted foot salvage and prevention of BKA; unfortunately patient did not have clean margins but ID is now recommending continue antibiotics for 6 weeks and recommending IV Zosyn and following of the cultures and pathology.  They recommend IV antibiotics and they feel he has failed oral antibiotics.  Given his renal function they are requesting a central Cathter for Prolonged IV Access so have asked IR to Place to try and preserve arm veins in case of future hemodialysis/fistula/graft placement requirement -Trend fever curve and WBC -Follow-up blood cultures and intraoperative cultures and ID feels that the duration of postoperative Abx will be at least 6 weeks given that margins were suspected not to be clear.  He now has a IJ central PICC -Now has an incisional wound VAC in place with no complications and dressings are left in place and podiatry recommends leaving the incisional wound VAC in place until the patient is ready for discharge to rehab facility; the plan will then to be to discharge the patient to rehab and place orders for wound care as he is will be nonweightbearing on the surgical extremity -Bowel Regimen initiated we will start him on MiraLAX 17 g twice daily and senna docusate 1 tab p.o. twice daily and Bisacodyl 10 mg rectal suppository daily as needed    PAD -Known history of PAD.  Did have angiogram in August 2023 with runoff.   -This was notable for right pedal circulation provided only by posterior tibial artery.  This is relevant to healing in the setting of osteomyelitis above. -Had previously been on aspirin and do not see this on patient's medication list currently -Saw Dr. [name] of Vascular surgery and has examined been worsened last September.  His angiography showed no options for reconstruction and he did have an inflow to the ankle via the peroneal only runoff which had robust arborization in the posterior tibial artery.  Fortunately there is no improvement chances is healing from a vascular standpoint at that time.   Diabetes Mellitus Type 2 -Currently holding Glipizide 5 mg p.o. daily before breakfast and Dapagliflozin 10 mg po Daily -Most recent HbA1c was 5.9 -Continue Sensitive Novolog SSI AC -Continue to Monitor CBG's per Protocol; CBG Trend: Recent Labs  Lab 08/30/22 0732 08/30/22 1115 08/30/22 1643 08/30/22 1933 08/31/22 0736 08/31/22 1142 08/31/22 1626  GLUCAP 113* 171* 169* 246* 137* 161* 164*    Hypertension -> Hypotension and Orthostatic VS -Continued home Furosemide 40 mg po Daily and Metoprolol Succinate 12.5 mg po Daily we will now discontinue furosemide and discontinue the metoprolol -Continue to Monitor BP per Protocol -Last BP Reading was 171/69 but was again symptomatic and had Orthostatic Hypotension -Adjusted BP Meds as necessary -Will give bolus and  continue TED hose and abdominal binder   Hyperlipidemia -Do not currently see patient on any medications for this on MAR  Hypokalemia -Patient's K+ Level Trend: Recent Labs  Lab 08/25/22 0421 08/26/22 0226 08/27/22 1052 08/28/22 0127 08/29/22 0158 08/30/22 0307 08/31/22 0229  K 4.6 3.8 3.7 3.5 3.5 3.4* 3.9  -Continue to Monitor and Replete as Necessary -Repeat CMP in the AM   Hypophosphatemia -Phos Level Trend: Recent Labs  Lab  08/26/22 0226 08/27/22 1052 08/28/22 0127 08/29/22 0158 08/30/22 0307 08/31/22 0229  PHOS 4.2 3.9 3.7 2.9 2.2* 2.7  -Replete with po K Phos Neutral 500 mg po BID -Continue to Monitor and Replete as Necessary -Repeat Phos Level in the AM   Chronic Diastolic CHF  -Echo earlier this year with EF 50-55%, G2 DD, normal RV function. -Continue home Furosemide 40 mg po Daily and Metoprolol Succinate 12.5 mg po Daily; Currently holding Dapaglifozin 10 mg po Daily  -Strict I's and O's and Daily Weights.  Intake/Output Summary (Last 24 hours) at 08/31/2022 1705 Last data filed at 08/31/2022 1600 Gross per 24 hour  Intake 1216.93 ml  Output 1250 ml  Net -33.07 ml  -IVF hydration now stopped and can resume Lasix at D/C however he still remains orthostatic so we will give him a 1 L bolus currently -Continue to Monitor for S/Sx of Volume Overload  Orthostatic Hypotension -BP dropped and patient had a near syncopal episode today; today's blood pressure dropped as low as 87/49 after sitting at the edge of the bed for almost 8 minutes and perform exercises but he had no symptoms.  When his return to supine his blood pressure improved to 132/56 -Continue Hold Lasix for now but resume at D/C -Initiate IV fluid hydration with normal saline at 75 MLS per hour for 1 day but this now been discontinued stopped.  Order TED Hose (on the Left) and Abdominal Binder -Repeat Orthostatic VS show that he dropped significantly and was symptomatic and went from 183/75 lying 162/67 sitting and then 121/48 standing and then 108/58 standing 3 minutes -Continue PT/OT to Evaluate and Treat and recommending SNF -See blood pressure reading as above   CAD -Chart review shows history of angioplasty. -Recently seen Dr. Antoine Poche with cardiology and he had an echocardiogram in February of this year which showed an EF of 50 to 55% -Holding both Metoprolol Succinate 12.5 mg po Daily and holding Lasix now given orthostasis    Normocytic Anemia -Likely dilutional and Post-Operative Drop -Hgb/Hct Trend: Recent Labs  Lab 08/25/22 0421 08/26/22 0226 08/27/22 1052 08/28/22 0127 08/29/22 0158 08/30/22 0307 08/31/22 0229  HGB 15.1 14.3 13.3 11.9* 11.6* 11.1* 11.4*  HCT 45.7 42.7 41.1 36.6* 35.8* 34.4* 35.1*  MCV 85.3 82.3 85.1 84.3 84.4 85.4 86.0  -Checked Anemia Panel and showed an iron level of 23, UIBC 176, TIBC 199, saturation ratios of 12%, ferritin level 152, v folate level 11.9 and a vitamin B12 of 273 -Continue to Monitor for S/Sx of Bleeding; No overt bleeding noted -Repeat CBC in the AM    AKI on CKD Stage 3a, improved  -BUN/Cr Trend: Recent Labs  Lab 08/25/22 0421 08/26/22 0226 08/27/22 1052 08/28/22 0127 08/29/22 0158 08/30/22 0307 08/31/22 0229  BUN 31* 35* 32* 33* 22 19 23   CREATININE 1.52* 1.83* 1.80* 1.68* 1.46* 1.18 1.22  -Avoid Nephrotoxic Medications, Contrast Dyes, Hypotension and Dehydration to Ensure Adequate Renal Perfusion and will need to Renally Adjust Meds -Was given IV Lasix and now changed back to  oral Lasix but will hold due to Orthostatic Hypotension and he received 1 L bolus -Continue to Monitor and Trend Renal Function carefully and repeat CMP in the AM    Hypoalbuminemia -Patient's Albumin Trend: Recent Labs  Lab 08/25/22 0421 08/26/22 0226 08/27/22 1052 08/28/22 0127 08/29/22 0158 08/30/22 0307 08/31/22 0229  ALBUMIN 3.3* 3.4* 3.3* 3.0* 2.9* 2.6* 2.5*  -Continue to Monitor and Trend and repeat CMP in the AM  Severe malnutrition in the context of chronic illness Nutrition Status: Nutrition Problem: Severe Malnutrition Etiology: chronic illness Signs/Symptoms: severe fat depletion, severe muscle depletion   -Nutrition recently been consulted and recommending Nepro shakes p.o. twice daily as well as Prosource +30 mL p.o. twice daily as well as a multivitamin p.o. daily, vitamin C 500 mg p.o. twice daily, zinc to 20 mg p.o. daily for 14 days  -The  dietitian feels the patient is a high refeeding risk and recommending monitoring potassium magnesium and phosphorus labs daily until stable and also when the patient daily -Patient continues to have poor po intake  Tinea Cruris -Diflucan Weekly Treatment (On Thursdays) for 4 weeks total and Nystatin Cream   DVT prophylaxis: Place TED hose Start: 08/30/22 1534 Place TED hose Start: 08/27/22 1532 Place TED hose Start: 08/27/22 1422 SCDs Start: 08/26/22 2051 SCDs Start: 08/24/22 1253    Code Status: DNR Family Communication: No family currently at bedside  Disposition Plan:  Level of care: Telemetry Surgical Status is: Inpatient Remains inpatient appropriate because: Continues to be orthostatic but has a bed availability at SNF once he is no longer orthostatic and be discharged.   Consultants:  Podiatry Infectious Diseases  IR  Procedures:  As delineated as above   Procedure by Dr. Logan Bores:  1.  Tendo Achilles lengthening right 2.  Transmetatarsal amputation right 3.  Bone biopsy fourth metatarsal right  Antimicrobials:  Anti-infectives (From admission, onward)    Start     Dose/Rate Route Frequency Ordered Stop   09/02/22 0000  fluconazole (DIFLUCAN) 150 MG tablet  Status:  Discontinued        150 mg Oral Weekly 08/30/22 1525 08/31/22    09/02/22 0000  fluconazole (DIFLUCAN) 150 MG tablet        150 mg Oral Weekly 08/31/22 1150 09/17/22 2359   08/30/22 0000  piperacillin-tazobactam (ZOSYN) IVPB        3.375 g Intravenous Every 6 hours 08/30/22 1525 10/07/22 2359   08/26/22 1400  fluconazole (DIFLUCAN) tablet 150 mg        150 mg Oral Weekly 08/26/22 1236 09/23/22 1359   08/24/22 2200  piperacillin-tazobactam (ZOSYN) IVPB 3.375 g        3.375 g 12.5 mL/hr over 240 Minutes Intravenous Every 8 hours 08/24/22 1248     08/24/22 1300  piperacillin-tazobactam (ZOSYN) IVPB 3.375 g        3.375 g 100 mL/hr over 30 Minutes Intravenous  Once 08/24/22 1248 08/24/22 1403        Subjective: Seen and examined at bedside and he is resting comfortably but then whenever he got uo static vital signs became dizzy. He complains of pain.  No nausea or vomiting.  No other concerns or complaints at this time.  Objective: Vitals:   08/30/22 1932 08/31/22 0455 08/31/22 0735 08/31/22 1600  BP: 131/63 (!) 175/65 (!) 156/60 (!) 171/69  Pulse: 65 64 67 64  Resp: 17 16  16   Temp: 98 F (36.7 C) 97.8 F (36.6 C) 97.8 F (36.6 C) (!) 97.5  F (36.4 C)  TempSrc:  Oral Oral Oral  SpO2: 99% 96% 98% 96%  Weight:      Height:        Intake/Output Summary (Last 24 hours) at 08/31/2022 1709 Last data filed at 08/31/2022 1600 Gross per 24 hour  Intake 1216.93 ml  Output 1250 ml  Net -33.07 ml   Filed Weights   08/24/22 1053 08/25/22 0500 08/30/22 0500  Weight: 78.9 kg 76.7 kg 80 kg   Examination: Physical Exam:  Constitutional: Elderly chronically ill-appearing Caucasian male in no acute distress Respiratory: Diminished to auscultation bilaterally with some coarse breath sounds, no wheezing, rales, rhonchi or crackles. Normal respiratory effort and patient is not tachypenic. No accessory muscle use.  Unlabored breathing Cardiovascular: RRR, no murmurs / rubs / gallops. S1 and S2 auscultated.  Has a right transmetatarsal amputation now Abdomen: Soft, non-tender, non-distended. Bowel sounds positive.  GU: Deferred. Musculoskeletal: No clubbing / cyanosis of digits/nails.  Right foot is bandaged and he has a right TMA Skin: No rashes, lesions, ulcers on limited skin evaluation. No induration; Warm and dry.  Neurologic: CN 2-12 grossly intact with no focal deficits. Romberg sign and cerebellar reflexes not assessed.  Psychiatric: Normal judgment and insight. Alert awake. Normal mood and appropriate affect.   Data Reviewed: I have personally reviewed following labs and imaging studies  CBC: Recent Labs  Lab 08/27/22 1052 08/28/22 0127 08/29/22 0158 08/30/22 0307  08/31/22 0229  WBC 10.0 7.7 7.3 6.3 5.6  NEUTROABS 6.1 5.1 4.4 3.7 3.1  HGB 13.3 11.9* 11.6* 11.1* 11.4*  HCT 41.1 36.6* 35.8* 34.4* 35.1*  MCV 85.1 84.3 84.4 85.4 86.0  PLT 204 178 163 150 167   Basic Metabolic Panel: Recent Labs  Lab 08/27/22 1052 08/28/22 0127 08/29/22 0158 08/30/22 0307 08/31/22 0229  NA 134* 136 141 138 142  K 3.7 3.5 3.5 3.4* 3.9  CL 102 103 105 108 109  CO2 23 24 27  21* 24  GLUCOSE 158* 166* 125* 131* 156*  BUN 32* 33* 22 19 23   CREATININE 1.80* 1.68* 1.46* 1.18 1.22  CALCIUM 8.5* 8.2* 8.3* 8.1* 8.4*  MG 2.8* 2.5* 2.4 2.4 2.1  PHOS 3.9 3.7 2.9 2.2* 2.7   GFR: Estimated Creatinine Clearance: 50.4 mL/min (by C-G formula based on SCr of 1.22 mg/dL). Liver Function Tests: Recent Labs  Lab 08/27/22 1052 08/28/22 0127 08/29/22 0158 08/30/22 0307 08/31/22 0229  AST 15 12* 12* 11* 13*  ALT 11 9 10 9 9   ALKPHOS 73 64 65 62 62  BILITOT 1.3* 1.0 1.2 0.9 0.6  PROT 6.7 6.2* 6.2* 5.9* 5.8*  ALBUMIN 3.3* 3.0* 2.9* 2.6* 2.5*   No results for input(s): "LIPASE", "AMYLASE" in the last 168 hours. No results for input(s): "AMMONIA" in the last 168 hours. Coagulation Profile: No results for input(s): "INR", "PROTIME" in the last 168 hours. Cardiac Enzymes: No results for input(s): "CKTOTAL", "CKMB", "CKMBINDEX", "TROPONINI" in the last 168 hours. BNP (last 3 results) No results for input(s): "PROBNP" in the last 8760 hours. HbA1C: No results for input(s): "HGBA1C" in the last 72 hours. CBG: Recent Labs  Lab 08/30/22 1643 08/30/22 1933 08/31/22 0736 08/31/22 1142 08/31/22 1626  GLUCAP 169* 246* 137* 161* 164*   Lipid Profile: No results for input(s): "CHOL", "HDL", "LDLCALC", "TRIG", "CHOLHDL", "LDLDIRECT" in the last 72 hours. Thyroid Function Tests: No results for input(s): "TSH", "T4TOTAL", "FREET4", "T3FREE", "THYROIDAB" in the last 72 hours. Anemia Panel: Recent Labs    08/29/22 0158  [id number]  273  FOLATE 11.9  FERRITIN 152  TIBC  199*  IRON 23*  RETICCTPCT 1.4   Sepsis Labs: No results for input(s): "PROCALCITON", "LATICACIDVEN" in the last 168 hours.  Recent Results (from the past 240 hour(s))  Blood Cultures x 2 sites     Status: None   Collection Time: 08/24/22 10:56 AM   Specimen: BLOOD RIGHT FOREARM  Result Value Ref Range Status   Specimen Description BLOOD RIGHT FOREARM  Final   Special Requests   Final    BOTTLES DRAWN AEROBIC AND ANAEROBIC Blood Culture adequate volume   Culture   Final    NO GROWTH 5 DAYS Performed at [facility], [address] [place], [place] [address]    Report Status 08/29/2022 FINAL  Final  Blood Cultures x 2 sites     Status: None   Collection Time: 08/24/22  3:54 PM   Specimen: BLOOD  Result Value Ref Range Status   Specimen Description BLOOD RIGHT ANTECUBITAL  Final   Special Requests   Final    BOTTLES DRAWN AEROBIC AND ANAEROBIC Blood Culture adequate volume   Culture   Final    NO GROWTH 5 DAYS Performed at [facility], [address], [place], [place] [address]    Report Status 08/29/2022 FINAL  Final  Aerobic/Anaerobic Culture w Gram Stain (surgical/deep wound)     Status: None   Collection Time: 08/26/22  6:23 PM   Specimen: Soft Tissue, Other  Result Value Ref Range Status   Specimen Description TISSUE  Final   Special Requests RT 4TH METATARSAL  Final   Gram Stain NO WBC SEEN NO ORGANISMS SEEN   Final   Culture   Final    No growth aerobically or anaerobically. Performed at [facility], [address] [place], [place] [address]    Report Status 08/31/2022 FINAL  Final    Radiology Studies: No results found.  Scheduled Meds:  (feeding supplement) PROSource Plus  30 mL Oral BID BM   ascorbic acid  500 mg Oral BID   Chlorhexidine Gluconate Cloth  6 each Topical Q0600   feeding supplement (NEPRO CARB STEADY)  237 mL Oral BID BM   fluconazole  150 mg Oral Weekly   insulin aspart  0-9 Units Subcutaneous TID WC   metoprolol  succinate  12.5 mg Oral Daily   multivitamin with minerals  1 tablet Oral Daily   nystatin cream   Topical BID   polyethylene glycol  17 g Oral BID   senna-docusate  1 tablet Oral BID   sodium chloride flush  3 mL Intravenous Q12H   zinc sulfate  220 mg Oral Daily   Continuous Infusions:  sodium chloride 75 mL/hr at 08/30/22 2215   piperacillin-tazobactam (ZOSYN)  IV 3.375 g (08/31/22 1427)    LOS: 6 days   Marguerita Merles, DO [facility] Available via Epic secure chat 7am-7pm After these hours, please refer to coverage provider listed on [email or web address] 08/31/2022, 5:09 PM

## 2022-08-31 NOTE — Consult Note (Signed)
   [facility] CM Inpatient Consult   08/31/2022  Antrell Abraha 1941-02-02 [id number]  [facility] [[facility]]  Accountable Care Organization [ACO] Patient: [facility]  Primary Care Provider:  Sonny Masters, FNP with [facility]   Patient was reviewed for high risk score with a 7 day length of stay barriers to care in community. Review reveals patient lives alone and is currently being recommended for short term rehab at a SNF level of care.  Patient was screened for hospitalization and to assess for post hospital [facility] Coordination needs.  Patient is being considered for a skilled nursing facility level of care for post hospital transition.  Met with patient and "relative" at the bedside.  Patient did not speak but smiled when explaining reason for the visit.  Patient with noted ongoing BP orthostatic issues and tolerance. She states patient is also not eating well. Gave a 24 hour nurse advise line magnet and an appointment reminder card with PCP.  Continue to follow progress for medical readiness for post hospital transition.  If the patient goes to a [facility] affiliated facility then, patient can be followed by [facility] [[facility]] Care Management PAC RN with traditional Medicare and approved Medicare Advantage plans.    Plan:  [facility] RN can follow for any known or needs for transitional care needs for returning to post facility care coordination needs to return to community.  For questions or referrals, please contact:   Charlesetta Shanks, RN BSN CCM [facility]  [phone] business mobile phone Toll free office [phone]  *Concierge Line  [phone] Fax number: [phone] [email or web address]

## 2022-09-01 ENCOUNTER — Inpatient Hospital Stay (HOSPITAL_COMMUNITY): Payer: Medicare Other

## 2022-09-01 DIAGNOSIS — Z452 Encounter for adjustment and management of vascular access device: Secondary | ICD-10-CM | POA: Diagnosis not present

## 2022-09-01 DIAGNOSIS — I272 Pulmonary hypertension, unspecified: Secondary | ICD-10-CM | POA: Diagnosis not present

## 2022-09-01 DIAGNOSIS — Z4781 Encounter for orthopedic aftercare following surgical amputation: Secondary | ICD-10-CM | POA: Diagnosis not present

## 2022-09-01 DIAGNOSIS — E1169 Type 2 diabetes mellitus with other specified complication: Secondary | ICD-10-CM | POA: Diagnosis not present

## 2022-09-01 DIAGNOSIS — E1143 Type 2 diabetes mellitus with diabetic autonomic (poly)neuropathy: Secondary | ICD-10-CM | POA: Diagnosis not present

## 2022-09-01 DIAGNOSIS — R627 Adult failure to thrive: Secondary | ICD-10-CM | POA: Diagnosis not present

## 2022-09-01 DIAGNOSIS — E1159 Type 2 diabetes mellitus with other circulatory complications: Secondary | ICD-10-CM | POA: Diagnosis not present

## 2022-09-01 DIAGNOSIS — Z978 Presence of other specified devices: Secondary | ICD-10-CM | POA: Diagnosis not present

## 2022-09-01 DIAGNOSIS — D649 Anemia, unspecified: Secondary | ICD-10-CM | POA: Diagnosis not present

## 2022-09-01 DIAGNOSIS — R770 Abnormality of albumin: Secondary | ICD-10-CM | POA: Diagnosis not present

## 2022-09-01 DIAGNOSIS — I152 Hypertension secondary to endocrine disorders: Secondary | ICD-10-CM | POA: Diagnosis not present

## 2022-09-01 DIAGNOSIS — M6281 Muscle weakness (generalized): Secondary | ICD-10-CM | POA: Diagnosis not present

## 2022-09-01 DIAGNOSIS — N1831 Chronic kidney disease, stage 3a: Secondary | ICD-10-CM | POA: Diagnosis not present

## 2022-09-01 DIAGNOSIS — N179 Acute kidney failure, unspecified: Secondary | ICD-10-CM | POA: Diagnosis not present

## 2022-09-01 DIAGNOSIS — E1122 Type 2 diabetes mellitus with diabetic chronic kidney disease: Secondary | ICD-10-CM | POA: Diagnosis not present

## 2022-09-01 DIAGNOSIS — R140 Abdominal distension (gaseous): Secondary | ICD-10-CM | POA: Diagnosis not present

## 2022-09-01 DIAGNOSIS — M86171 Other acute osteomyelitis, right ankle and foot: Secondary | ICD-10-CM | POA: Diagnosis not present

## 2022-09-01 DIAGNOSIS — K5909 Other constipation: Secondary | ICD-10-CM | POA: Diagnosis not present

## 2022-09-01 DIAGNOSIS — R531 Weakness: Secondary | ICD-10-CM | POA: Diagnosis not present

## 2022-09-01 DIAGNOSIS — K5641 Fecal impaction: Secondary | ICD-10-CM | POA: Diagnosis not present

## 2022-09-01 DIAGNOSIS — D62 Acute posthemorrhagic anemia: Secondary | ICD-10-CM | POA: Diagnosis not present

## 2022-09-01 DIAGNOSIS — R488 Other symbolic dysfunctions: Secondary | ICD-10-CM | POA: Diagnosis not present

## 2022-09-01 DIAGNOSIS — E43 Unspecified severe protein-calorie malnutrition: Secondary | ICD-10-CM | POA: Diagnosis not present

## 2022-09-01 DIAGNOSIS — Z66 Do not resuscitate: Secondary | ICD-10-CM | POA: Diagnosis not present

## 2022-09-01 DIAGNOSIS — R2681 Unsteadiness on feet: Secondary | ICD-10-CM | POA: Diagnosis not present

## 2022-09-01 DIAGNOSIS — S98311D Complete traumatic amputation of right midfoot, subsequent encounter: Secondary | ICD-10-CM | POA: Diagnosis not present

## 2022-09-01 DIAGNOSIS — R262 Difficulty in walking, not elsewhere classified: Secondary | ICD-10-CM | POA: Diagnosis not present

## 2022-09-01 DIAGNOSIS — M86071 Acute hematogenous osteomyelitis, right ankle and foot: Secondary | ICD-10-CM | POA: Diagnosis not present

## 2022-09-01 DIAGNOSIS — N183 Chronic kidney disease, stage 3 unspecified: Secondary | ICD-10-CM | POA: Diagnosis not present

## 2022-09-01 DIAGNOSIS — E440 Moderate protein-calorie malnutrition: Secondary | ICD-10-CM | POA: Diagnosis not present

## 2022-09-01 DIAGNOSIS — E785 Hyperlipidemia, unspecified: Secondary | ICD-10-CM | POA: Diagnosis not present

## 2022-09-01 DIAGNOSIS — I129 Hypertensive chronic kidney disease with stage 1 through stage 4 chronic kidney disease, or unspecified chronic kidney disease: Secondary | ICD-10-CM | POA: Diagnosis not present

## 2022-09-01 DIAGNOSIS — F01C0 Vascular dementia, severe, without behavioral disturbance, psychotic disturbance, mood disturbance, and anxiety: Secondary | ICD-10-CM | POA: Diagnosis not present

## 2022-09-01 DIAGNOSIS — M869 Osteomyelitis, unspecified: Secondary | ICD-10-CM | POA: Diagnosis not present

## 2022-09-01 DIAGNOSIS — I2510 Atherosclerotic heart disease of native coronary artery without angina pectoris: Secondary | ICD-10-CM | POA: Diagnosis not present

## 2022-09-01 DIAGNOSIS — Z7401 Bed confinement status: Secondary | ICD-10-CM | POA: Diagnosis not present

## 2022-09-01 DIAGNOSIS — I951 Orthostatic hypotension: Secondary | ICD-10-CM | POA: Diagnosis not present

## 2022-09-01 DIAGNOSIS — E1141 Type 2 diabetes mellitus with diabetic mononeuropathy: Secondary | ICD-10-CM | POA: Diagnosis not present

## 2022-09-01 DIAGNOSIS — I5032 Chronic diastolic (congestive) heart failure: Secondary | ICD-10-CM | POA: Diagnosis not present

## 2022-09-01 DIAGNOSIS — E1151 Type 2 diabetes mellitus with diabetic peripheral angiopathy without gangrene: Secondary | ICD-10-CM | POA: Diagnosis not present

## 2022-09-01 DIAGNOSIS — I700 Atherosclerosis of aorta: Secondary | ICD-10-CM | POA: Diagnosis not present

## 2022-09-01 DIAGNOSIS — E538 Deficiency of other specified B group vitamins: Secondary | ICD-10-CM | POA: Diagnosis not present

## 2022-09-01 DIAGNOSIS — I739 Peripheral vascular disease, unspecified: Secondary | ICD-10-CM | POA: Diagnosis not present

## 2022-09-01 DIAGNOSIS — M6701 Short Achilles tendon (acquired), right ankle: Secondary | ICD-10-CM | POA: Diagnosis not present

## 2022-09-01 LAB — CBC WITH DIFFERENTIAL/PLATELET
Abs Immature Granulocytes: 0.02 10*3/uL (ref 0.00–0.07)
Basophils Absolute: 0 10*3/uL (ref 0.0–0.1)
Basophils Relative: 1 %
Eosinophils Absolute: 0.4 10*3/uL (ref 0.0–0.5)
Eosinophils Relative: 7 %
HCT: 33.4 % — ABNORMAL LOW (ref 39.0–52.0)
Hemoglobin: 10.8 g/dL — ABNORMAL LOW (ref 13.0–17.0)
Immature Granulocytes: 0 %
Lymphocytes Relative: 31 %
Lymphs Abs: 1.7 10*3/uL (ref 0.7–4.0)
MCH: 28.1 pg (ref 26.0–34.0)
MCHC: 32.3 g/dL (ref 30.0–36.0)
MCV: 86.8 fL (ref 80.0–100.0)
Monocytes Absolute: 0.5 10*3/uL (ref 0.1–1.0)
Monocytes Relative: 8 %
Neutro Abs: 3 10*3/uL (ref 1.7–7.7)
Neutrophils Relative %: 53 %
Platelets: 170 10*3/uL (ref 150–400)
RBC: 3.85 MIL/uL — ABNORMAL LOW (ref 4.22–5.81)
RDW: 14.1 % (ref 11.5–15.5)
WBC: 5.6 10*3/uL (ref 4.0–10.5)
nRBC: 0 % (ref 0.0–0.2)

## 2022-09-01 LAB — COMPREHENSIVE METABOLIC PANEL
ALT: 11 U/L (ref 0–44)
AST: 14 U/L — ABNORMAL LOW (ref 15–41)
Albumin: 2.6 g/dL — ABNORMAL LOW (ref 3.5–5.0)
Alkaline Phosphatase: 74 U/L (ref 38–126)
Anion gap: 7 (ref 5–15)
BUN: 17 mg/dL (ref 8–23)
CO2: 22 mmol/L (ref 22–32)
Calcium: 8.3 mg/dL — ABNORMAL LOW (ref 8.9–10.3)
Chloride: 111 mmol/L (ref 98–111)
Creatinine, Ser: 1.16 mg/dL (ref 0.61–1.24)
GFR, Estimated: >60 mL/min (ref >60)
Glucose, Bld: 164 mg/dL — ABNORMAL HIGH (ref 70–99)
Potassium: 3.6 mmol/L (ref 3.5–5.1)
Sodium: 140 mmol/L (ref 135–145)
Total Bilirubin: 0.5 mg/dL (ref 0.3–1.2)
Total Protein: 5.9 g/dL — ABNORMAL LOW (ref 6.5–8.1)

## 2022-09-01 LAB — RESP PANEL BY RT-PCR (FLU A&B, COVID) ARPGX2
Influenza A by PCR: NEGATIVE
Influenza B by PCR: NEGATIVE
SARS Coronavirus 2 by RT PCR: NEGATIVE

## 2022-09-01 LAB — MAGNESIUM: Magnesium: 2.2 mg/dL (ref 1.7–2.4)

## 2022-09-01 LAB — GLUCOSE, CAPILLARY
Glucose-Capillary: 137 mg/dL — ABNORMAL HIGH (ref 70–99)
Glucose-Capillary: 170 mg/dL — ABNORMAL HIGH (ref 70–99)
Glucose-Capillary: 144 mg/dL — ABNORMAL HIGH (ref 70–99)

## 2022-09-01 LAB — PHOSPHORUS: Phosphorus: 2.4 mg/dL — ABNORMAL LOW (ref 2.5–4.6)

## 2022-09-01 MED ORDER — LACTULOSE 10 GM/15ML PO SOLN
20.0000 g | Freq: Once | ORAL | Status: AC
  Administered 2022-09-01: 20 g via ORAL
  Filled 2022-09-01: qty 30

## 2022-09-01 NOTE — Progress Notes (Signed)
All personal belongings returned to patient. PTAR at bedside.

## 2022-09-01 NOTE — Progress Notes (Signed)
Physical Therapy Treatment Patient Details Name: Wayne [name] MRN: [id number] DOB: 05/31/1940 Today's Date: 09/01/2022   History of Present Illness 82 y.o. male admitted 5/28 with Rt foot osteomyelitis. 5/30 s/p Tendo Achilles lengthening right and Transmetatarsal amputation right.   PM Hof type 2 diabetes, dyslipidemia, peripheral artery disease.    PT Comments    Pt received in supine and agreeable to session. RN requesting that pt transfer to [facility] for BM at the beginning of the session. Pt able to stand from EOB and pivot to and from [facility] with up to mod A. Pt demonstrating difficulty maintaining RLE NWB at times due to impaired balance, however is able to improve with cues and assist. Pt also demonstrating difficulty supporting with BUEs on RW to advance LLE. Pt continues to benefit from PT services to progress toward functional mobility goals.     Recommendations for follow up therapy are one component of a multi-disciplinary discharge planning process, led by the attending physician.  Recommendations may be updated based on patient status, additional functional criteria and insurance authorization.  Follow Up Recommendations  Can patient physically be transported by private vehicle: No    Assistance Recommended at Discharge Frequent or constant Supervision/Assistance  Patient can return home with the following Two people to help with walking and/or transfers;A lot of help with bathing/dressing/bathroom;Assistance with cooking/housework;Direct supervision/assist for medications management;Direct supervision/assist for financial management;Assist for transportation   Equipment Recommendations   (TBD next venue)    Recommendations for Other Services       Precautions / Restrictions Precautions Precautions: Fall;Other (comment) Precaution Comments: watch BP Restrictions Weight Bearing Restrictions: Yes RLE Weight Bearing: Non weight bearing     Mobility  Bed Mobility Overal  bed mobility: Needs Assistance Bed Mobility: Supine to Sit     Supine to sit: HOB elevated, Min guard     General bed mobility comments: Supervision for safety. cues for sequencing and use of bedrail    Transfers Overall transfer level: Needs assistance Equipment used: Rolling walker (2 wheels) Transfers: Sit to/from Stand, Bed to chair/wheelchair/BSC Sit to Stand: Min assist, From elevated surface Stand pivot transfers: Mod assist         General transfer comment: Min A to power up from elevated EOB after unsuccessful attempt from lower EOB. Mod A for balance, RW management, and to maintain RLE NWB during pivot to [facility] and back to bed. Pt demonstrating difficulty offloading with BUE to hop with LLE.        Balance Overall balance assessment: Needs assistance Sitting-balance support: No upper extremity supported, Feet unsupported Sitting balance-Leahy Scale: Good Sitting balance - Comments: sitting EOB   Standing balance support: Reliant on assistive device for balance, Bilateral upper extremity supported Standing balance-Leahy Scale: Poor Standing balance comment: with RW support                            Cognition Arousal/Alertness: Awake/alert Behavior During Therapy: WFL for tasks assessed/performed Overall Cognitive Status: Difficult to assess                                          Exercises      General Comments General comments (skin integrity, edema, etc.): Pt reporting no adverse symptoms throughout session      Pertinent Vitals/Pain Pain Assessment Pain Assessment: No/denies pain  PT Goals (current goals can now be found in the care plan section) Acute Rehab PT Goals Patient Stated Goal: none PT Goal Formulation: With patient Time For Goal Achievement: 09/10/22 Potential to Achieve Goals: Good Progress towards PT goals: Progressing toward goals    Frequency    Min 3X/week      PT Plan Current plan  remains appropriate       AM-PAC PT "6 Clicks" Mobility   Outcome Measure  Help needed turning from your back to your side while in a flat bed without using bedrails?: None Help needed moving from lying on your back to sitting on the side of a flat bed without using bedrails?: A Little Help needed moving to and from a bed to a chair (including a wheelchair)?: A Lot Help needed standing up from a chair using your arms (e.g., wheelchair or bedside chair)?: A Little Help needed to walk in hospital room?: Total Help needed climbing 3-5 steps with a railing? : Total 6 Click Score: 14    End of Session   Activity Tolerance: Patient tolerated treatment well Patient left: in bed;with call bell/phone within reach;with family/visitor present Nurse Communication: Mobility status PT Visit Diagnosis: Muscle weakness (generalized) (M62.81);History of falling (Z91.81);Difficulty in walking, not elsewhere classified (R26.2);Repeated falls (R29.6)     Time: 1610-9604 PT Time Calculation (min) (ACUTE ONLY): 36 min  Charges:  $Therapeutic Activity: 23-37 mins                     Johny Shock, PTA Acute Rehabilitation Services Secure Chat Preferred  [phone]    Johny Shock 09/01/2022, 2:26 PM

## 2022-09-01 NOTE — TOC Progression Note (Addendum)
Transition of Care [facility]) - Progression Note    Patient Details  Name: Wayne [name] MRN: [id number] Date of Birth: [birth year]  Transition of Care [facility]) CM/SW Contact  Lorri Frederick, LCSW Phone Number: 09/01/2022, 11:12 AM  Clinical Narrative:   CSW spoke with [facility] and she confirmed she can receive pt today.  She has spoken to insurance and they do have 24 hour grace period to admit after auth expires today.  She needs covid test prior to admission.  MD informed.   1445: DC summary sent to [facility].  Covid still pending.  Kerri aware pt may arrive later, this is not a problem.    Expected Discharge Plan:  (TBD) Barriers to Discharge: Continued Medical Work up  Expected Discharge Plan and Services In-house Referral: Clinical Social Work   Post Acute Care Choice:  (TBD) Living arrangements for the past 2 months: Single Family Home Expected Discharge Date: 08/30/22                                     Social Determinants of Health (SDOH) Interventions SDOH Screenings   Food Insecurity: No Food Insecurity (05/22/2022)  Housing: Low Risk  (05/22/2022)  Transportation Needs: No Transportation Needs (05/22/2022)  Utilities: Not At Risk (05/22/2022)  Depression (PHQ2-9): Low Risk  (08/19/2022)  Financial Resource Strain: Low Risk  (05/05/2022)  Physical Activity: Inactive (05/05/2022)  Social Connections: Socially Isolated (05/05/2022)  Stress: No Stress Concern Present (05/05/2022)  Tobacco Use: Medium Risk (08/28/2022)    Readmission Risk Interventions    11/12/2021    2:59 PM  Readmission Risk Prevention Plan  Post Dischage Appt Complete  Medication Screening Complete  Transportation Screening Complete

## 2022-09-01 NOTE — Progress Notes (Signed)
Wound vac removed by provider at bedside.

## 2022-09-01 NOTE — Progress Notes (Signed)
   Subjective: 6 Days Post-Op Procedure(s) (LRB): MIDFOOT AMPUTATION RIGHT FOOT, BONE BIOPSY RIGHT FOOT, TENDO ACHILIES LENGTHENING (Right) APPLICATION OF WOUND VAC (Right) DOS: 08/26/2022  Patient feeling well.  No pain to the foot.  Dressings are clean dry and intact.  Assessment/Plan: - Incisional VAC was removed.  Patient is okay to be discharged from podiatric standpoint - Patient will see Dr. Logan Bores 1 week from discharge -Antibiotics per infectious disease with long-term IV antibiotics -Nonweightbearing to the right lower extremity -Dispo planning today.   Objective: Vital signs in last 24 hours: Temp:  [97.5 F (36.4 C)-98.8 F (37.1 C)] 97.7 F (36.5 C) (06/05 0735) Pulse Rate:  [58-64] 62 (06/05 0735) Resp:  [16-20] 20 (06/05 0418) BP: (156-177)/(60-74) 175/74 (06/05 0735) SpO2:  [94 %-98 %] 98 % (06/05 1029)  Recent Labs    08/30/22 0307 08/31/22 0229 09/01/22 0239  HGB 11.1* 11.4* 10.8*    Recent Labs    08/31/22 0229 09/01/22 0239  WBC 5.6 5.6  RBC 4.08* 3.85*  HCT 35.1* 33.4*  PLT 167 170    Recent Labs    08/31/22 0229 09/01/22 0239  NA 142 140  K 3.9 3.6  CL 109 111  CO2 24 22  BUN 23 17  CREATININE 1.22 1.16  GLUCOSE 156* 164*  CALCIUM 8.4* 8.3*    No results for input(s): "LABPT", "INR" in the last 72 hours.  Candelaria Stagers 09/01/2022, 1:03 PM

## 2022-09-01 NOTE — Progress Notes (Signed)
   09/01/22 1420  Gastrointestinal  Last BM Date  09/01/22

## 2022-09-01 NOTE — Progress Notes (Signed)
Attempted to call report, left VM with Director of nursing.

## 2022-09-01 NOTE — Progress Notes (Addendum)
PROGRESS NOTE    Wayne [name]  WGN:[id number] DOB: 12/10/1940 DOA: 08/24/2022  PCP: Sonny Masters, FNP   Brief Narrative:  This 82 year old Caucasian male with PMH significant for diabetes mellitus type 2, neuropathy, PAD, hypertension, hyperlipidemia, CKD stage IIIa, subdural hematoma, chronic diastolic CHF, CAD who presented with right foot osteomyelitis and pain. He has been having ongoing right foot pain and he had a previous right fifth metatarsal amputation and has been following with wound care, infectious disease and podiatry in outpatient setting. He been on 8-week course of Augmentin and ciprofloxacin and follow-up with his podiatrist yesterday and there is concern for osteomyelitis which was confirmed on the x-ray in the office and there is also new fracture noted. He was sent to the ED for IV antibiotics and admission for surgical intervention.  Podiatry recommended MRI and antibiotics and he was started on IV Zosyn. ID has been consulted and they are recommending continue IV Zosyn pending further surgical intervention and patient underwent TMA of his right foot on 08/26/2022. Patient is s/p TMA, postoperative day 5.  Patient was supposed to be discharged 2 days ago but then had orthostatic hypotension,  discharge was discontinued.  Patient is now feeling much improved.  Blood pressure has improved.  Patient is going to be discharged to SNF today for rehab.  Assessment and Plan:   Osteomyelitis of right fourth metatarsal status post a tendo Achilles lengthening on the right, transmetatarsal amputation on the right and a bone biopsy of the fourth metatarsal in the right POD6  patient was taken to the OR on 08/26/2022 and had a bone biopsy of the fourth metatarsal as well as a transmetatarsal amputation on the right and tendo Achilles lengthening; per Dr. Logan Bores he does not suspect clean osseous margins postoperatively and will need to continue IV antibiotics for 6 weeks and OPAT orders  are being placed -Continue with IV Zosyn and given his poor chance of healing we have consulted infectious diseases for further antibiotic management -ID consulted and recommending continuing IV Zosyn pending further surgical evaluation and they believe that the patient exhausted all his efforts at limb salvage progression with osteomyelitis suspected prolonged antibiotic therapy and attempt at limb salvage and 1 amputation November 2024.  They feel that he needs a more definitive surgery at this time and recommending having the patient consider a BKA but patient is undergoing a TMA for further attempted foot salvage and prevention of BKA; unfortunately patient did not have clean margins but ID is now recommending continue antibiotics for 6 weeks and recommending IV Zosyn and following of the cultures and pathology.  They recommend IV antibiotics and they feel he has failed oral antibiotics.  Given his renal function they are requesting a central Cathter for Prolonged IV Access so have asked IR to Place to try and preserve arm veins in case of future hemodialysis/fistula/graft placement requirement -Follow-up blood cultures and intraoperative cultures and ID feels that the duration of postoperative Abx will be at least 6 weeks given that margins were suspected not to be clear.  He now has a IJ central PICC -Now has an incisional wound VAC in place with no complications and dressings are left in place and podiatry recommends leaving the incisional wound VAC in place until the patient is ready for discharge to rehab facility; the plan will then to be to discharge the patient to rehab and place orders for wound care as he is will be nonweightbearing on the surgical extremity -Bowel Regimen  initiated we will start him on MiraLAX 17 g twice daily and senna docusate 1 tab p.o. twice daily and Bisacodyl 10 mg rectal suppository daily as needed   PAD -Known history of PAD.  Did have angiogram in August 2023 with  runoff.   -This was notable for right pedal circulation provided only by posterior tibial artery.  This is relevant to healing in the setting of osteomyelitis above. -Had previously been on aspirin and do not see this on patient's medication list currently -Saw Dr. [name] of Vascular surgery and has examined been worsened last September.  His angiography showed no options for reconstruction and he did have an inflow to the ankle via the peroneal only runoff which had robust arborization in the posterior tibial artery.  Fortunately there is no improvement chances is healing from a vascular standpoint at that time.   Diabetes Mellitus Type 2 -Most recent HbA1c was 5.9 -Continue Sensitive Novolog SSI AC  Hypertension -> Hypotension and Orthostatic VS -Hold Metoprolol and Lasix. -Continue to Monitor BP per Protocol   Hyperlipidemia Not on any medication.   Hypokalemia Replaced, Resolved.   Hypophosphatemia Continue to monitor   Chronic Diastolic CHF  -Echo earlier this year with EF 50-55%, G2 DD, normal RV function. Patient doesn't appeared in acute exacerbation. -Continue to Monitor for S/Sx of Volume Overload.   Orthostatic Hypotension -BP dropped and patient had a near syncopal episode yesterday ; today blood pressure is improved. -Continue Hold Lasix for now but resume at D/C Order TED Hose (on the Left) and Abdominal Binder Orthostatic blood pressure is improved. -Continue PT/OT to Evaluate and Treat and recommending SNF -See blood pressure reading as above   CAD -Recently seen Dr. Antoine Poche with cardiology and he had an echocardiogram in February of this year which showed an EF of 50 to 55% -Holding both Metoprolol Succinate 12.5 mg po Daily and holding Lasix now given orthostasis    Normocytic Anemia -Likely dilutional and Post-Operative Drop. - H/H stable.   AKI on CKD Stage 3a, improved . Improved.   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 twice daily as well as Prosource +30 mL p.o. twice daily as well as a multivitamin p.o. daily, vitamin C 500 mg p.o. twice daily, zinc to 20 mg p.o. daily for 14 days  -The dietitian feels the patient is a high refeeding risk and recommending monitoring potassium magnesium and phosphorus labs daily until stable and also when the patient daily -Patient continues to have poor po intake   Tinea Cruris -Diflucan Weekly Treatment (On Thursdays) for 4 weeks total and Nystatin Cream       DVT prophylaxis: Place TED hose Start: 08/30/22 1534 Place TED hose Start: 08/27/22 1532 Place TED hose Start: 08/27/22 1422 SCDs Start: 08/26/22 2051 SCDs Start: 08/24/22 1253    Code Status: DNR Family Communication: No family currently at bedside  Disposition Plan:  Level of care: Telemetry Surgical Status is: Inpatient Remains inpatient appropriate because: Patient's blood pressure has improved.  Patient is being discharged to SNF today.  Consultants:  Podiatry Infectious Diseases  IR  Procedures:  As delineated as above   Procedure by Dr. Logan Bores:  1.  Tendo Achilles lengthening right 2.  Transmetatarsal amputation right 3.  Bone biopsy fourth metatarsal  right  Antimicrobials:  Anti-infectives (From admission, onward)    Start     Dose/Rate Route Frequency Ordered Stop   09/02/22 0000  fluconazole (DIFLUCAN) 150 MG tablet  Status:  Discontinued        150 mg Oral Weekly 08/30/22 1525 08/31/22    09/02/22 0000  fluconazole (DIFLUCAN) 150 MG tablet        150 mg Oral Weekly 08/31/22 1150 09/17/22 2359   08/30/22 0000  piperacillin-tazobactam (ZOSYN) IVPB        3.375 g Intravenous Every 6 hours 08/30/22 1525 10/07/22  2359   08/26/22 1400  fluconazole (DIFLUCAN) tablet 150 mg        150 mg Oral Weekly 08/26/22 1236 09/23/22 1359   08/24/22 2200  piperacillin-tazobactam (ZOSYN) IVPB 3.375 g        3.375 g 12.5 mL/hr over 240 Minutes Intravenous Every 8 hours 08/24/22 1248     08/24/22 1300  piperacillin-tazobactam (ZOSYN) IVPB 3.375 g        3.375 g 100 mL/hr over 30 Minutes Intravenous  Once 08/24/22 1248 08/24/22 1403       Subjective: Patient was seen and examined at bedside.  Overnight events noted.   Patient lies comfortably.  He says he is doing better and wants to be discharged today.  Objective: Vitals:   08/31/22 2052 09/01/22 0418 09/01/22 0735 09/01/22 1029  BP: (!) 177/68 (!) 156/60 (!) 175/74   Pulse: 61 (!) 58 62   Resp: 18 20    Temp: 97.9 F (36.6 C) 98.8 F (37.1 C) 97.7 F (36.5 C)   TempSrc: Oral  Oral   SpO2: 98% 97% 94% 98%  Weight:      Height:        Intake/Output Summary (Last 24 hours) at 09/01/2022 1150 Last data filed at 09/01/2022 1056 Gross per 24 hour  Intake 4252.38 ml  Output 1850 ml  Net 2402.38 ml   Filed Weights   08/24/22 1053 08/25/22 0500 08/30/22 0500  Weight: 78.9 kg 76.7 kg 80 kg   Examination: Physical Exam:  Constitutional: Elderly chronically ill-appearing, comfortable, not in any acute distress. Respiratory: Decreased breath sounds.  No wheezing no crackles.  Normal respiratory effort. Cardiovascular: RRR, no murmurs / rubs / gallops. S1 and S2 auscultated.  Has a right transmetatarsal amputation now Abdomen: Soft, non-tender, non-distended. Bowel sounds positive.  GU: Deferred. Musculoskeletal: No clubbing / cyanosis of digits/nails.  Right foot is bandaged and he has a right TMA Skin: No rashes, lesions, ulcers on limited skin evaluation. No induration; Warm and dry.  Neurologic: Alert and oriented x 3, no focal neurological deficits. Psychiatric: Normal judgment and insight. Alert awake. Normal mood and appropriate affect.   Data  Reviewed: I have personally reviewed following labs and imaging studies  CBC: Recent Labs  Lab 08/28/22 0127 08/29/22 0158 08/30/22 0307 08/31/22 0229 09/01/22 0239  WBC 7.7 7.3 6.3 5.6 5.6  NEUTROABS 5.1 4.4 3.7 3.1 3.0  HGB 11.9* 11.6* 11.1* 11.4* 10.8*  HCT 36.6* 35.8* 34.4* 35.1* 33.4*  MCV 84.3 84.4 85.4 86.0 86.8  PLT 178 163 150 167 170   Basic Metabolic Panel: Recent Labs  Lab 08/28/22 0127 08/29/22 0158 08/30/22 0307 08/31/22 0229 09/01/22 0239  NA 136 141 138 142 140  K 3.5 3.5 3.4* 3.9 3.6  CL 103 105 108 109 111  CO2 24 27 21* 24 22  GLUCOSE 166* 125* 131* 156* 164*  BUN 33* 22 19 23 17   CREATININE  1.68* 1.46* 1.18 1.22 1.16  CALCIUM 8.2* 8.3* 8.1* 8.4* 8.3*  MG 2.5* 2.4 2.4 2.1 2.2  PHOS 3.7 2.9 2.2* 2.7 2.4*   GFR: Estimated Creatinine Clearance: 53 mL/min (by C-G formula based on SCr of 1.16 mg/dL). Liver Function Tests: Recent Labs  Lab 08/28/22 0127 08/29/22 0158 08/30/22 0307 08/31/22 0229 09/01/22 0239  AST 12* 12* 11* 13* 14*  ALT 9 10 9 9 11   ALKPHOS 64 65 62 62 74  BILITOT 1.0 1.2 0.9 0.6 0.5  PROT 6.2* 6.2* 5.9* 5.8* 5.9*  ALBUMIN 3.0* 2.9* 2.6* 2.5* 2.6*   No results for input(s): "LIPASE", "AMYLASE" in the last 168 hours. No results for input(s): "AMMONIA" in the last 168 hours. Coagulation Profile: No results for input(s): "INR", "PROTIME" in the last 168 hours. Cardiac Enzymes: No results for input(s): "CKTOTAL", "CKMB", "CKMBINDEX", "TROPONINI" in the last 168 hours. BNP (last 3 results) No results for input(s): "PROBNP" in the last 8760 hours. HbA1C: No results for input(s): "HGBA1C" in the last 72 hours. CBG: Recent Labs  Lab 08/31/22 1142 08/31/22 1626 08/31/22 2131 09/01/22 0735 09/01/22 1146  GLUCAP 161* 164* 167* 137* 170*   Lipid Profile: No results for input(s): "CHOL", "HDL", "LDLCALC", "TRIG", "CHOLHDL", "LDLDIRECT" in the last 72 hours. Thyroid Function Tests: No results for input(s): "TSH", "T4TOTAL",  "FREET4", "T3FREE", "THYROIDAB" in the last 72 hours. Anemia Panel: No results for input(s): "VITAMINB12", "FOLATE", "FERRITIN", "TIBC", "IRON", "RETICCTPCT" in the last 72 hours.  Sepsis Labs: No results for input(s): "PROCALCITON", "LATICACIDVEN" in the last 168 hours.  Recent Results (from the past 240 hour(s))  Blood Cultures x 2 sites     Status: None   Collection Time: 08/24/22 10:56 AM   Specimen: BLOOD RIGHT FOREARM  Result Value Ref Range Status   Specimen Description BLOOD RIGHT FOREARM  Final   Special Requests   Final    BOTTLES DRAWN AEROBIC AND ANAEROBIC Blood Culture adequate volume   Culture   Final    NO GROWTH 5 DAYS Performed at [facility], [address], [place], [place] [address]    Report Status 08/29/2022 FINAL  Final  Blood Cultures x 2 sites     Status: None   Collection Time: 08/24/22  3:54 PM   Specimen: BLOOD  Result Value Ref Range Status   Specimen Description BLOOD RIGHT ANTECUBITAL  Final   Special Requests   Final    BOTTLES DRAWN AEROBIC AND ANAEROBIC Blood Culture adequate volume   Culture   Final    NO GROWTH 5 DAYS Performed at [facility], [address], [place], [place] [address]    Report Status 08/29/2022 FINAL  Final  Aerobic/Anaerobic Culture w Gram Stain (surgical/deep wound)     Status: None   Collection Time: 08/26/22  6:23 PM   Specimen: Soft Tissue, Other  Result Value Ref Range Status   Specimen Description TISSUE  Final   Special Requests RT 4TH METATARSAL  Final   Gram Stain NO WBC SEEN NO ORGANISMS SEEN   Final   Culture   Final    No growth aerobically or anaerobically. Performed at [facility], [address], [place], [place] [address]    Report Status 08/31/2022 FINAL  Final    Radiology Studies: No results found.  Scheduled Meds:  ascorbic acid  500 mg Oral BID   Chlorhexidine Gluconate Cloth  6 each Topical Q0600   feeding supplement (NEPRO CARB STEADY)  237  mL Oral BID BM    fluconazole  150 mg Oral Weekly   insulin aspart  0-9 Units Subcutaneous TID WC   metoprolol succinate  12.5 mg Oral Daily   multivitamin with minerals  1 tablet Oral Daily   nystatin cream   Topical BID   polyethylene glycol  17 g Oral BID   senna-docusate  1 tablet Oral BID   sodium chloride flush  3 mL Intravenous Q12H   zinc sulfate  220 mg Oral Daily   Continuous Infusions:  sodium chloride 75 mL/hr at 09/01/22 0809   piperacillin-tazobactam (ZOSYN)  IV 12.5 mL/hr at 09/01/22 0809    LOS: 7 days     Willeen Niece, MD [facility] hospitalist  Time spent 50 minutes.      09/01/2022, 11:50 AM

## 2022-09-01 NOTE — Progress Notes (Signed)
Attempted to call report to [phone], no answer. Will try again.

## 2022-09-01 NOTE — Plan of Care (Signed)
  Problem: Education: Goal: Ability to describe self-care measures that may prevent or decrease complications (Diabetes Survival Skills Education) will improve Outcome: Adequate for Discharge Goal: Individualized Educational Video(s) Outcome: Adequate for Discharge   Problem: Coping: Goal: Ability to adjust to condition or change in health will improve Outcome: Adequate for Discharge   Problem: Fluid Volume: Goal: Ability to maintain a balanced intake and output will improve Outcome: Adequate for Discharge   Problem: Health Behavior/Discharge Planning: Goal: Ability to identify and utilize available resources and services will improve Outcome: Adequate for Discharge Goal: Ability to manage health-related needs will improve Outcome: Adequate for Discharge   Problem: Metabolic: Goal: Ability to maintain appropriate glucose levels will improve Outcome: Adequate for Discharge   Problem: Nutritional: Goal: Maintenance of adequate nutrition will improve Outcome: Adequate for Discharge Goal: Progress toward achieving an optimal weight will improve Outcome: Adequate for Discharge   Problem: Skin Integrity: Goal: Risk for impaired skin integrity will decrease Outcome: Adequate for Discharge   Problem: Tissue Perfusion: Goal: Adequacy of tissue perfusion will improve Outcome: Adequate for Discharge   Problem: Education: Goal: Knowledge of General Education information will improve Description: Including pain rating scale, medication(s)/side effects and non-pharmacologic comfort measures Outcome: Adequate for Discharge   Problem: Health Behavior/Discharge Planning: Goal: Ability to manage health-related needs will improve Outcome: Adequate for Discharge   Problem: Clinical Measurements: Goal: Ability to maintain clinical measurements within normal limits will improve Outcome: Adequate for Discharge Goal: Will remain free from infection Outcome: Adequate for Discharge Goal:  Diagnostic test results will improve Outcome: Adequate for Discharge Goal: Respiratory complications will improve Outcome: Adequate for Discharge Goal: Cardiovascular complication will be avoided Outcome: Adequate for Discharge   Problem: Nutrition: Goal: Adequate nutrition will be maintained Outcome: Adequate for Discharge   Problem: Activity: Goal: Risk for activity intolerance will decrease Outcome: Adequate for Discharge   Problem: Coping: Goal: Level of anxiety will decrease Outcome: Adequate for Discharge   Problem: Elimination: Goal: Will not experience complications related to bowel motility Outcome: Adequate for Discharge Goal: Will not experience complications related to urinary retention Outcome: Adequate for Discharge   Problem: Pain Managment: Goal: General experience of comfort will improve Outcome: Adequate for Discharge   Problem: Safety: Goal: Ability to remain free from injury will improve Outcome: Adequate for Discharge   Problem: Skin Integrity: Goal: Risk for impaired skin integrity will decrease Outcome: Adequate for Discharge

## 2022-09-01 NOTE — Progress Notes (Signed)
Report called into Vickie LPN at [facility].

## 2022-09-01 NOTE — TOC Transition Note (Signed)
Transition of Care [facility]) - CM/SW Discharge Note   Patient Details  Name: Wayne [name] MRN: [id number] Date of Birth: [birth year]  Transition of Care [facility]) CM/SW Contact:  Lorri Frederick, LCSW Phone Number: 09/01/2022, 3:50 PM   Clinical Narrative:  Pt discharging to [facility].  RN call report to [phone].  Pt is on wait status with [facility].  Once negative covid test is complete, call [facility] at [phone] and they will put pt in line for pickup.        Final next level of care: Skilled Nursing Facility Barriers to Discharge: Barriers Resolved   Patient Goals and CMS Choice      Discharge Placement                Patient chooses bed at:  [facility]) Patient to be transferred to facility by: [facility] Name of family member notified: [name], ex wife Patient and family notified of of transfer: 09/01/22  Discharge Plan and Services Additional resources added to the After Visit Summary for   In-house Referral: Clinical Social Work   Post Acute Care Choice:  (TBD)                               Social Determinants of Health (SDOH) Interventions SDOH Screenings   Food Insecurity: No Food Insecurity (05/22/2022)  Housing: Low Risk  (05/22/2022)  Transportation Needs: No Transportation Needs (05/22/2022)  Utilities: Not At Risk (05/22/2022)  Depression (PHQ2-9): Low Risk  (08/19/2022)  Financial Resource Strain: Low Risk  (05/05/2022)  Physical Activity: Inactive (05/05/2022)  Social Connections: Socially Isolated (05/05/2022)  Stress: No Stress Concern Present (05/05/2022)  Tobacco Use: Medium Risk (08/28/2022)     Readmission Risk Interventions    11/12/2021    2:59 PM  Readmission Risk Prevention Plan  Post Dischage Appt Complete  Medication Screening Complete  Transportation Screening Complete

## 2022-09-01 NOTE — Progress Notes (Signed)
Nutrition Follow-up  DOCUMENTATION CODES:   Severe malnutrition in context of chronic illness  INTERVENTION:  Discontinue ProSource Plus Continue Nepro Shake po BID, each supplement provides 425 kcal and 19 grams protein Continue MVI with minerals daily Recommend increasing bowel regimen d/t last documented BM 5/27  NUTRITION DIAGNOSIS:   Severe Malnutrition related to chronic illness as evidenced by severe fat depletion, severe muscle depletion. - remains applicable  GOAL:   Patient will meet greater than or equal to 90% of their needs - goal unmet, addressing via meals and nutrition supplements  MONITOR:   PO intake, Supplement acceptance, Labs, Weight trends, Skin, I & O's  REASON FOR ASSESSMENT:   Consult Assessment of nutrition requirement/status  ASSESSMENT:   82 y/o male with h/o DM, CAD, PVD, CHD, CKD III, HTN, HLD, SDH, pulmonary hypertension and recent R fifth metatarsal amputation (February) and who is now admitted with osteomyelitis.  5/30- s/p bone biopsy of 4th metatarsal and R TMA  Pt was planned for d/c 6/3 to SNF however developed orthostatic hypotension extending admission.   Pt continues with poor appetite/PO intake which he correlates with dislike of hospital food and wanting to go home. Expresses lots of frutrations over inability to discharge.   Observed ProSource on bedside table which pt consumed 100% of. He states that he does not like these and they make him sick. He does agree to continue nepro shakes and has been consuming 2 per day on average.   Meal completions have remained widely variable throughout admission.   Provided warm blanket and addressed all concerns.   Medications: Vitamin C 500mg  BID, MVI, miralax, senna, zinc (05/30-06/13), IV abx  Labs: Phos 2.4, AST 14, CBG's 137-170 x24 hours  Diet Order:   Diet Order             Diet - low sodium heart healthy           Diet - low sodium heart healthy           Diet Carb  Modified           Diet Carb Modified Fluid consistency: Thin; Room service appropriate? Yes  Diet effective now                   EDUCATION NEEDS:   Education needs have been addressed  Skin:  Skin Assessment: Reviewed RN Assessment (R foot osteomyelitis s/p metatarsal amputation)  Last BM:  5/27  Height:   Ht Readings from Last 1 Encounters:  08/24/22 6' (1.829 m)    Weight:   Wt Readings from Last 1 Encounters:  08/30/22 80 kg    Ideal Body Weight:  80.9 kg  BMI:  Body mass index is 23.92 kg/m.  Estimated Nutritional Needs:   Kcal:  2000-2300kcal/day  Protein:  100-115g/day  Fluid:  1.9-2.2L/day  Drusilla Kanner, RDN, LDN Clinical Nutrition

## 2022-09-02 ENCOUNTER — Telehealth: Payer: Self-pay | Admitting: Podiatry

## 2022-09-02 ENCOUNTER — Encounter: Payer: Self-pay | Admitting: Adult Health

## 2022-09-02 ENCOUNTER — Non-Acute Institutional Stay (SKILLED_NURSING_FACILITY): Payer: Medicare Other | Admitting: Adult Health

## 2022-09-02 DIAGNOSIS — R627 Adult failure to thrive: Secondary | ICD-10-CM

## 2022-09-02 DIAGNOSIS — I5032 Chronic diastolic (congestive) heart failure: Secondary | ICD-10-CM | POA: Diagnosis not present

## 2022-09-02 DIAGNOSIS — E1141 Type 2 diabetes mellitus with diabetic mononeuropathy: Secondary | ICD-10-CM | POA: Diagnosis not present

## 2022-09-02 DIAGNOSIS — I739 Peripheral vascular disease, unspecified: Secondary | ICD-10-CM | POA: Diagnosis not present

## 2022-09-02 DIAGNOSIS — E43 Unspecified severe protein-calorie malnutrition: Secondary | ICD-10-CM | POA: Diagnosis not present

## 2022-09-02 DIAGNOSIS — I129 Hypertensive chronic kidney disease with stage 1 through stage 4 chronic kidney disease, or unspecified chronic kidney disease: Secondary | ICD-10-CM

## 2022-09-02 DIAGNOSIS — M86171 Other acute osteomyelitis, right ankle and foot: Secondary | ICD-10-CM | POA: Diagnosis not present

## 2022-09-02 DIAGNOSIS — N183 Chronic kidney disease, stage 3 unspecified: Secondary | ICD-10-CM

## 2022-09-02 DIAGNOSIS — K5909 Other constipation: Secondary | ICD-10-CM | POA: Diagnosis not present

## 2022-09-02 DIAGNOSIS — E785 Hyperlipidemia, unspecified: Secondary | ICD-10-CM

## 2022-09-02 DIAGNOSIS — E538 Deficiency of other specified B group vitamins: Secondary | ICD-10-CM

## 2022-09-02 DIAGNOSIS — N1831 Chronic kidney disease, stage 3a: Secondary | ICD-10-CM | POA: Diagnosis not present

## 2022-09-02 DIAGNOSIS — E1122 Type 2 diabetes mellitus with diabetic chronic kidney disease: Secondary | ICD-10-CM | POA: Insufficient documentation

## 2022-09-02 DIAGNOSIS — D631 Anemia in chronic kidney disease: Secondary | ICD-10-CM

## 2022-09-02 DIAGNOSIS — E1169 Type 2 diabetes mellitus with other specified complication: Secondary | ICD-10-CM

## 2022-09-02 DIAGNOSIS — E1151 Type 2 diabetes mellitus with diabetic peripheral angiopathy without gangrene: Secondary | ICD-10-CM | POA: Diagnosis not present

## 2022-09-02 DIAGNOSIS — I700 Atherosclerosis of aorta: Secondary | ICD-10-CM

## 2022-09-02 NOTE — Telephone Encounter (Signed)
Patient's daughter [name]) called.  Patient was seen at hospital by Dr. Logan Bores and had TMA on 5/30.  Patient is now in a rehab facility [facility]?).  Daughter states the facility needs instructions / orders on what they need to do each day with the surgery site.  Her callback number is [phone]

## 2022-09-02 NOTE — Progress Notes (Signed)
Location:  [facility] Room Number: 151 Place of Service:  SNF (31)   CODE STATUS: dnr   No Known Allergies  Chief Complaint  Patient presents with   Hospitalization Follow-up    HPI:  He is a 82 year old man who has been hospitalized from 08-24-22 through 09-01-22. His medical history includes: type 2 diabetes mellitus; neuropathy; PAD: hypertension; hyperlipidemia; CKD stage 3a; chronic diastolic CHF; CAD. He presented to the ED with right foot osteomyelitis and pain. He had been having ongoing right foot pain with a previous right 5th metatarsal amputation. He had been on an 8 week coarse of augmentin and cipro. He had a podiatry follow up the day prior to his hospitalization. This was confirmed with an x-ray and found to have a new fracture. He was then sent to the ED for IV abt and surgical intervention. ID was consulted and he was started on IV zosyn. He underwent a TMA of the right foot on 08-26-22. He did have some orthostatic blood pressure readings which required one liter of IVF. Is his blood pressure continues to drop he may benefit from midodrine. He has a tunneled catheter for his abt; to preserve his extremity venous system. He had failed oral abt. He is here for short term rehab with his goal to return back home. He denies any pain; states that his appetite is fair; denies any constipation. He will continue to be followed for his chronic illnesses including:  Chronic diastolic CHF (congestive heart failure): Hypertension in stage 3 chronic kidney disease associated with type 2 diabetes mellitus:  Type 2 diabetes mellitus with peripheral vascular disease:Diabetic mononeuropathy associated with type 2 diabetes mellitus:     Past Medical History:  Diagnosis Date   Anemia of chronic disease    Chronic HFrEF (heart failure with reduced ejection fraction) (HCC)    Chronic kidney disease, stage 3a (HCC)    Coronary artery disease    Dr.  Chalk years ago. No  details.   Diabetes mellitus with circulatory complication (HCC)    Diabetic foot infection (HCC)    Encephalopathy    Former tobacco use    Lactic acidosis 11/28/2021   Multifocal pneumonia 11/28/2021   PAD (peripheral artery disease) (HCC)    Peripheral neuropathy    SDH (subdural hematoma) (HCC)    Severe pulmonary hypertension (HCC)     Past Surgical History:  Procedure Laterality Date   ABDOMINAL AORTOGRAM W/LOWER EXTREMITY Right 11/13/2021   Procedure: ABDOMINAL AORTOGRAM W/LOWER EXTREMITY;  Surgeon: Leonie Douglas, MD;  Location: [facility] INVASIVE CV LAB;  Service: Cardiovascular;  Laterality: Right;   AMPUTATION Right 05/23/2022   Procedure: AMPUTATION RAY;  Surgeon: Pilar Plate, DPM;  Location: [facility] OR;  Service: Podiatry;  Laterality: Right;  Partial 5th ray amputation   AMPUTATION Right 08/26/2022   Procedure: MIDFOOT AMPUTATION RIGHT FOOT, BONE BIOPSY RIGHT FOOT, TENDO ACHILIES LENGTHENING;  Surgeon: Felecia Shelling, DPM;  Location: [facility] OR;  Service: Podiatry;  Laterality: Right;   APPLICATION OF WOUND VAC Right 08/26/2022   Procedure: APPLICATION OF WOUND VAC;  Surgeon: Felecia Shelling, DPM;  Location: [facility] OR;  Service: Podiatry;  Laterality: Right;   CORONARY ANGIOPLASTY WITH STENT PLACEMENT     IR FLUORO GUIDE CV LINE RIGHT  08/27/2022   IR US GUIDE VASC ACCESS RIGHT  08/27/2022   IRRIGATION AND DEBRIDEMENT FOOT Right 05/25/2022   Procedure: IRRIGATION AND DEBRIDEMENT FOOT;  Surgeon: Vivi Barrack, DPM;  Location:  [facility] OR;  Service: Podiatry;  Laterality: Right;    Social History   Socioeconomic History   Marital status: Divorced    Spouse name: Not on file   Number of children: Not on file   Years of education: Not on file   Highest education level: Not on file  Occupational History   Not on file  Tobacco Use   Smoking status: Former    Types: Cigarettes    Quit date: 68    Years since quitting: 31.4   Smokeless tobacco: Never  Vaping Use   Vaping Use:  Never used  Substance and Sexual Activity   Alcohol use: No   Drug use: No   Sexual activity: Not Currently  Other Topics Concern   Not on file  Social History Narrative   Not on file   Social Determinants of Health   Financial Resource Strain: Low Risk  (05/05/2022)   Overall Financial Resource Strain (CARDIA)    Difficulty of Paying Living Expenses: Not hard at all  Food Insecurity: No Food Insecurity (05/22/2022)   Hunger Vital Sign    Worried About Running Out of Food in the Last Year: Never true    Ran Out of Food in the Last Year: Never true  Transportation Needs: No Transportation Needs (05/22/2022)   PRAPARE - Administrator, Civil Service (Medical): No    Lack of Transportation (Non-Medical): No  Physical Activity: Inactive (05/05/2022)   Exercise Vital Sign    Days of Exercise per Week: 0 days    Minutes of Exercise per Session: 0 min  Stress: No Stress Concern Present (05/05/2022)   [employer] of Occupational Health - Occupational Stress Questionnaire    Feeling of Stress : Not at all  Social Connections: Socially Isolated (05/05/2022)   Social Connection and Isolation Panel [NHANES]    Frequency of Communication with Friends and Family: More than three times a week    Frequency of Social Gatherings with Friends and Family: More than three times a week    Attends Religious Services: Never    Database administrator or Organizations: No    Attends Banker Meetings: Never    Marital Status: Divorced  Catering manager Violence: Not At Risk (05/22/2022)   Humiliation, Afraid, Rape, and Kick questionnaire    Fear of Current or Ex-Partner: No    Emotionally Abused: No    Physically Abused: No    Sexually Abused: No   Family History  Problem Relation Age of Onset   Cervical cancer Mother    Down syndrome Sister    Diabetes type II Neg Hx       VITAL SIGNS BP 138/60   Pulse (!) 58   Temp 98 F (36.7 C)   Resp 20   Ht 6' (1.829 m)   Wt  173 lb (78.5 kg)   SpO2 96%   BMI 23.46 kg/m   Outpatient Encounter Medications as of 09/02/2022  Medication Sig   metoprolol succinate (TOPROL-XL) 25 MG 24 hr tablet Take 12.5 mg by mouth daily.   Nutritional Supplements (ENSURE CLEAR PO) Take 1 Bottle by mouth 2 (two) times daily.   Protein (PROSOURCE PO) Take 30 mLs by mouth 3 (three) times daily.   Accu-Chek Softclix Lancets lancets Use 4 times daily as directed to check blood sugars. (Patient taking differently: 1 each by Other route in the morning, at noon, in the evening, and at bedtime. Sig from Pharmacy: Use 4 times  daily as directed to check blood sugars.)   acetaminophen (TYLENOL) 325 MG tablet Take 2 tablets (650 mg total) by mouth every 6 (six) hours as needed for mild pain (or Fever >/= 101).   ascorbic acid (VITAMIN C) 500 MG tablet Take 1 tablet (500 mg total) by mouth 2 (two) times daily.   bisacodyl (DULCOLAX) 10 MG suppository Place 1 suppository (10 mg total) rectally daily as needed for moderate constipation.   dapagliflozin propanediol (FARXIGA) 10 MG TABS tablet Take 1 tablet (10 mg total) by mouth daily.   fluconazole (DIFLUCAN) 150 MG tablet Take 1 tablet (150 mg total) by mouth once a week for 3 doses.   furosemide (LASIX) 40 MG tablet Take 1 tablet (40 mg total) by mouth daily.   glipiZIDE (GLUCOTROL) 5 MG tablet TAKE 1 TABLET BY MOUTH EVERY DAY BEFORE BREAKFAST (Patient taking differently: Take 5 mg by mouth daily before breakfast.)   Multiple Vitamin (MULTIVITAMIN WITH MINERALS) TABS tablet Take 1 tablet by mouth daily.   nystatin cream (MYCOSTATIN) Apply topically 2 (two) times daily.   piperacillin-tazobactam (ZOSYN) IVPB Inject 3.375 g into the vein every 6 (six) hours. Indication:  R-foot osteo First Dose: Yes Last Day of Therapy:  10/07/22 Labs - Once weekly:  CBC/D and BMP, ESR and CRP Method of administration: Intermittent infusions over 30 minutes Pull PICC line at the completion of IV antibiotics Method of  administration may be changed at the discretion of home infusion pharmacist based upon assessment of the patient and/or caregiver's ability to self-administer the medication ordered.   polyethylene glycol (MIRALAX / GLYCOLAX) 17 g packet Take 17 g by mouth 2 (two) times daily.   senna-docusate (SENOKOT-S) 8.6-50 MG tablet Take 1 tablet by mouth 2 (two) times daily.   zinc sulfate 220 (50 Zn) MG capsule Take 1 capsule (220 mg total) by mouth daily. For 14 days   No facility-administered encounter medications on file as of 09/02/2022.     SIGNIFICANT DIAGNOSTIC EXAMS  TODAY  08-19-22: hgb A1c 6.7 08-24-22: wbc 5.5; hgb 14.9; hct 47.3; mcv 86.0 plt 205; glucose 150; bun 35; creat 1.53; k+ 4.4; na++ 135; ca 9.1; gfr 45; protein 8.9 albumin 3.7; blood culture no growth 08-26-22: wbc 7.3; hgb 14.3; hct 42.7; mcv 82.3 plt 209; glucose 116; bun 35; creat 1.83; k+ 3.8; na++ 140; ca 8.9; gfr 36; protein 7.1 albumin 3.4 phos 4.2 mag 2.7 08-28-22: wbc 7.7; hgb 11.9; hct 36.6; mcv 84.3 plt 178; glucose 166; bun 33; creat 1.68; k+ 3.5; na++ 136; ca 8.2; gfr 40; protein 6.2 albumin 3.0 mag 2.5 phos 3.7 08-29-22: wbc 7.3; hgb 11.6; hct 35.8; mcv 84.4 plt 163; glucose 125; bun 22; creat 1.46; k+ 3.5; na++ 141; ca 8.3 gfr 47; protein 6.2 albumin 2.9; mag 2.4 phos 2.9; vitamin B12: 273; iron 23; tibc 199; ferritin 152; folate 11.9 09-01-22: wbc 5.6; hgb 10.8; hct 33.4; mcv 86.8 plt 170; glucose 164; bun 17; creat 1.16; k+ 3.6; na++ 140; ca 8.3 gfr >60; protein 5.9; albumin 2.6; mag 2.2 phos 2.4   Review of Systems  Constitutional:  Negative for malaise/fatigue.  Respiratory:  Negative for cough and shortness of breath.   Cardiovascular:  Negative for chest pain, palpitations and leg swelling.  Gastrointestinal:  Negative for abdominal pain, constipation and heartburn.  Musculoskeletal:  Negative for back pain, joint pain and myalgias.  Skin: Negative.   Neurological:  Negative for dizziness.  Psychiatric/Behavioral:   The patient is not nervous/anxious.  Physical Exam Constitutional:      General: He is not in acute distress.    Appearance: He is well-developed. He is not diaphoretic.  HENT:     Head: Normocephalic.     Nose: Nose normal.     Mouth/Throat:     Mouth: Mucous membranes are moist.     Pharynx: Oropharynx is clear.  Eyes:     Conjunctiva/sclera: Conjunctivae normal.  Neck:     Thyroid: No thyromegaly.  Cardiovascular:     Rate and Rhythm: Regular rhythm. Bradycardia present.     Pulses: Normal pulses.     Heart sounds: Normal heart sounds.  Pulmonary:     Effort: Pulmonary effort is normal. No respiratory distress.     Breath sounds: Normal breath sounds.  Abdominal:     General: Bowel sounds are normal. There is no distension.     Palpations: Abdomen is soft.     Tenderness: There is no abdominal tenderness.  Musculoskeletal:        General: Normal range of motion.     Cervical back: Neck supple.     Right lower leg: No edema.     Left lower leg: No edema.  Lymphadenopathy:     Cervical: No cervical adenopathy.  Skin:    General: Skin is warm and dry.     Comments: Right foot amputation: ace wrap in place   Neurological:     Mental Status: He is alert and oriented to person, place, and time.  Psychiatric:        Mood and Affect: Mood normal.       ASSESSMENT/ PLAN:  TODAY  PAD (Peripheral artery disease)/acute osteomyelitis of right foot:  is status post TMA of right foot; dressing with wound vac in place. Will continue therapy as directed to improve upon his level of independence with his ADL. Will continue zosyn 3.375 gm every 6 hours through 10-07-22.   2. Chronic diastolic CHF (congestive heart failure): EF 50-55%; will continue lasix 40 mg daily; toprol xl 12.5 mg daily   3. Hypertension in stage 3 chronic kidney disease associated with type 2 diabetes mellitus: b/p 138/60; will continue toprol xl 12.5 mg daily   4. Type 2 diabetes mellitus with  peripheral vascular disease: hgb a1c 6.7 will continue farxiga 10 mg daily and will stop glipizide  5. Diabetic mononeuropathy associated with type 2 diabetes mellitus: is stable no issues with pain at this time  6. Hyperlipidemia associated with type 2 diabetes mellitus: will monitor  7. 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 Chronic constipation: will continue miralax twice daily and senna s twice daily   9. Failure to thrive in adult/severe protein calorie malnutrition: will continue therapy as directed to improve upon his level of independence with his adls. Weight is 173 pounds; will continue ensure clear twice daily and prosource three times daily   10. Vitamin B 12 deficiency: level is 273; will begin 1,000 mcg daily   11. Anemia is stage 3 chronic kidney disease: hgb 10.8; will monitor   12. Aortic atherosclerosis (12-02-21)   Labs to be drawn on 09-09-22.     Synthia Innocent NP [facility]  call [phone]

## 2022-09-03 ENCOUNTER — Encounter: Payer: Self-pay | Admitting: Podiatry

## 2022-09-03 NOTE — Telephone Encounter (Signed)
I called the patients nurse to give verbal orders of xeroform along the incision followed by 4x4, kerlix, ACE every other day and elevate foot. If anything is different, please reach out to the RN. Thanks!

## 2022-09-06 ENCOUNTER — Other Ambulatory Visit: Payer: Self-pay | Admitting: *Deleted

## 2022-09-06 NOTE — Patient Outreach (Signed)
Per [facility], Mr. Rivero resides in [facility] skilled nursing facility.    Following for potential [facility] care coordination services as a benefit of health plan and primary care provider.  [name], MSN, RN,BSN [facility] Post Acute Care Coordinator [phone] (Direct dial)

## 2022-09-07 ENCOUNTER — Encounter: Payer: Self-pay | Admitting: Internal Medicine

## 2022-09-07 ENCOUNTER — Non-Acute Institutional Stay (SKILLED_NURSING_FACILITY): Payer: Medicare Other | Admitting: Internal Medicine

## 2022-09-07 DIAGNOSIS — E43 Unspecified severe protein-calorie malnutrition: Secondary | ICD-10-CM

## 2022-09-07 DIAGNOSIS — I951 Orthostatic hypotension: Secondary | ICD-10-CM | POA: Diagnosis not present

## 2022-09-07 DIAGNOSIS — E1151 Type 2 diabetes mellitus with diabetic peripheral angiopathy without gangrene: Secondary | ICD-10-CM

## 2022-09-07 DIAGNOSIS — F01C0 Vascular dementia, severe, without behavioral disturbance, psychotic disturbance, mood disturbance, and anxiety: Secondary | ICD-10-CM

## 2022-09-07 DIAGNOSIS — N179 Acute kidney failure, unspecified: Secondary | ICD-10-CM

## 2022-09-07 DIAGNOSIS — M869 Osteomyelitis, unspecified: Secondary | ICD-10-CM | POA: Diagnosis not present

## 2022-09-07 DIAGNOSIS — D62 Acute posthemorrhagic anemia: Secondary | ICD-10-CM | POA: Insufficient documentation

## 2022-09-07 DIAGNOSIS — F0390 Unspecified dementia without behavioral disturbance: Secondary | ICD-10-CM | POA: Insufficient documentation

## 2022-09-07 NOTE — Progress Notes (Signed)
NURSING HOME LOCATION:  [facility] ROOM NUMBER:  151 P  CODE STATUS:    PCP: Gilford Silvius FNP  This is a comprehensive admission note to this SNFperformed on this date less than 30 days from date of admission. Included are preadmission medical/surgical history; reconciled medication list; family history; social history and comprehensive review of systems.  Corrections and additions to the records were documented. Comprehensive physical exam was also performed. Additionally a clinical summary was entered for each active diagnosis pertinent to this admission in the Problem List to enhance continuity of care.  HPI: He was hospitalized 5/28 - 09/01/2022 with osteomyelitis.  He was sent from his Podiatrist's office with a diagnosis of osteomyelitis based on exam and imaging.  He been on an 8 week course of antibiotics with Augmentin and ciprofloxacin.  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  There was questionable early osteomyelitis of the head of the first metatarsal, proximal fourth phalanx, and distal one half of the fifth neck. Dr. Logan Bores completed tendon Achilles lengthening, bone biopsy of the fourth metatarsal, and midfoot amputation.  ID consulted and recommended IV Zosyn which was administered via a tunneled PICC line. Hospital course was complicated by AKI with a peak creatinine 1.83 and a nadir GFR 36.  With rehydration at discharge creatinine was 1.16  & GFR greater than 60 indicating CKD stage II.   Course was also complicated by progressive anemia.  Initial H/H was 14.9/47.3; nadir H/H was 10.8/33.4.  Iron deficiency anemia was documented despite normochromic, normocytic indices.   A1c was 5.9%; glucoses ranged from a low of 115 up to a high of 166 while hospitalized.  Protein/caloric malnutrition was documented by an albumin of 2.6 and total protein of 5.9.  Postural hypotension  delayed discharge.  He received 1 L boli of normal saline, abdominal binder, and TED hose.  Midodrine was to be considered if the postural hypotension persisted. SNF placement was recommended because of debilitation, advanced age, and multiple comorbidities.  Past medical and surgical history: Includes history of subdural hematoma, essential hypertension, dyslipidemia, diabetes with CKD stage IIIa, chronic diastolic congestive heart failure, PAD, and protein/caloric malnutrition. Surgeries and procedures include percutaneous coronary angioplasty with stenting and prior fifth metatarsal amputation.  Social history: Nondrinker; former smoker.  Family history: Noncontributory due to advanced age.   Review of systems: Clinical neurocognitive deficits made validity of responses questionable. Date given as "October,???".  He could not name any of his physicians.  He could not communicate the diagnosis of osteomyelitis but did realize that "all of the little toes removed because could not heal."  He denies any pain or neuropathic symptoms.  He denies any diabetic related symptoms or any symptoms of CHF.  He stated that prior to hospitalization he was not checking glucoses on was on no medication for his diabetes.  Review of systems was essentially negative.  Constitutional: No fever, significant weight change, fatigue  Eyes: No redness, discharge, pain, vision change ENT/mouth: No nasal congestion, purulent discharge, earache, change in hearing, sore throat  Cardiovascular: No chest pain, palpitations, paroxysmal nocturnal dyspnea, claudication, edema  Respiratory: No cough, sputum production, hemoptysis, DOE, significant snoring, apnea Gastrointestinal: No heartburn, dysphagia, abdominal pain, nausea /vomiting, rectal bleeding, melena, change in bowels Genitourinary: No dysuria, hematuria, pyuria, incontinence, nocturia Musculoskeletal: No joint stiffness, joint swelling, weakness, pain Dermatologic: No  rash, pruritus, change in appearance of skin Neurologic: No dizziness,  headache, syncope, seizures, numbness, tingling Psychiatric: No significant anxiety, depression, insomnia, anorexia Endocrine: No change in hair/skin/nails, excessive thirst, excessive hunger, excessive urination  Hematologic/lymphatic: No significant bruising, lymphadenopathy, abnormal bleeding Allergy/immunology: No itchy/watery eyes, significant sneezing, urticaria, angioedema  Physical exam:  Pertinent or positive findings: He appears his age.  Hair is thin over the crown.  He is markedly hard of hearing.  He is missing multiple mandibular teeth.  The remaining mandibular teeth are discolored.  He has an upper dental plate.  Heart sounds are distant and heart rate is slow.  Pedal pulses in the left lower extremity are not palpable.  He is wearing TED hose.  The right foot is surgically wrapped; it is apparent that he has had the digits removed.  Interosseous wasting is present.  General appearance: no acute distress, increased work of breathing is present.   Lymphatic: No lymphadenopathy about the head, neck, axilla. Eyes: No conjunctival inflammation or lid edema is present. There is no scleral icterus. Ears:  External ear exam shows no significant lesions or deformities.   Nose:  External nasal examination shows no deformity or inflammation. Nasal mucosa are pink and moist without lesions, exudates Neck:  No thyromegaly, masses, tenderness noted.    Heart:  No gallop, murmur, click, rub.  Lungs: Chest clear to auscultation without wheezes, rhonchi, rales, rubs. Abdomen: Bowel sounds are normal.  Abdomen is soft and nontender with no organomegaly, hernias, masses. GU: Deferred  Extremities:  No cyanosis, clubbing, edema. Neurologic exam: Balance, Rhomberg, finger to nose testing could not be completed due to clinical state Skin: Warm & dry w/o tenting. No significant lesions or rash.  See clinical summary under each  active problem in the Problem List with associated updated therapeutic plan

## 2022-09-07 NOTE — Assessment & Plan Note (Signed)
IV Zosyn via tunneled PICC line with ID clinic follow-up 5-6 weeks post discharge.

## 2022-09-07 NOTE — Assessment & Plan Note (Addendum)
Glucoses while hospitalized ranged from a low of 115 up to a high of 166; A1 document excellent control with a value of 5.9%. Glucose monitor at SNF.

## 2022-09-07 NOTE — Assessment & Plan Note (Signed)
Current albumin 2.6 and total protein 5.9.  Nutritionist to consult at SNF.

## 2022-09-07 NOTE — Assessment & Plan Note (Addendum)
09/07/2022 supine blood pressure 167/67; sitting 155/66; and standing 124/57.  No change in pulse.  Patient is asymptomatic.  Dementia precludes compliance with isometrics.

## 2022-09-07 NOTE — Patient Instructions (Signed)
See assessment and plan under each diagnosis in the problem list and acutely for this visit 

## 2022-09-07 NOTE — Assessment & Plan Note (Signed)
MMSE will be performed by ST

## 2022-09-07 NOTE — Assessment & Plan Note (Signed)
5/28 - 09/01/2022 hospitalization for right midfoot amputation for osteomyelitis.  Preop H/H 14.9/47.3; nadir H/H 10.8/33.4.  Iron panel documented iron deficiency anemia. Iron supplementation at SNF.

## 2022-09-07 NOTE — Assessment & Plan Note (Signed)
5/28 - 09/01/2022 hospitalized with osteomyelitis. Peak creatinine 1.83 with a nadir GFR 36 indicating AKI stage IIIb.  With rehydration predischarge creatinine 1.16 with GFR greater than 60 indicating CKD stage II.  Med list reviewed; no changes in medications indicated.

## 2022-09-08 ENCOUNTER — Ambulatory Visit: Payer: Medicare Other | Admitting: Podiatry

## 2022-09-08 DIAGNOSIS — L97514 Non-pressure chronic ulcer of other part of right foot with necrosis of bone: Secondary | ICD-10-CM

## 2022-09-08 NOTE — Progress Notes (Signed)
Chief Complaint  Patient presents with   Routine Post Op    POST OP - DOS 08/26/2022 - MIDFOOT AMPUTATION RIGHT FOOT, BONE BIOPSY RIGHT FOOT, TENDO ACHILIES LENGTHENING    Subjective:  Patient presents today status post midfoot amputation right with bone biopsy and tendo Achilles lengthening performed inpatient.  DOS: 08/26/2022.  Patient doing well.  No pain.  Currently NWB in a cam boot.  He is currently staying at [facility] nursing facility.  Past Medical History:  Diagnosis Date   Anemia of chronic disease    Chronic HFrEF (heart failure with reduced ejection fraction) (HCC)    Chronic kidney disease, stage 3a (HCC)    Coronary artery disease    Dr. Deborah Chalk years ago. No details.   Diabetes mellitus with circulatory complication (HCC)    Diabetic foot infection (HCC)    Encephalopathy    Former tobacco use    Lactic acidosis 11/28/2021   Multifocal pneumonia 11/28/2021   PAD (peripheral artery disease) (HCC)    Peripheral neuropathy    SDH (subdural hematoma) (HCC)    Severe pulmonary hypertension (HCC)     Past Surgical History:  Procedure Laterality Date   ABDOMINAL AORTOGRAM W/LOWER EXTREMITY Right 11/13/2021   Procedure: ABDOMINAL AORTOGRAM W/LOWER EXTREMITY;  Surgeon: Leonie Douglas, MD;  [facility]: [facility] IN[facility];  Service: Cardiovascular;  Laterality: Right;   AMPUTATION Right 05/23/2022   Procedure: AMPUTATION RAY;  Surgeon: Pilar Plate, DPM;  Location: [facility] OR;  Service: Podiatry;  Laterality: Right;  Partial 5th ray amputation   AMPUTATION Right 08/26/2022   Procedure: MIDFOOT AMPUTATION RIGHT FOOT, BONE BIOPSY RIGHT FOOT, TENDO ACHILIES LENGTHENING;  Surgeon: Felecia Shelling, DPM;  Location: [facility] OR;  Service: Podiatry;  Laterality: Right;   APPLICATION OF WOUND VAC Right 08/26/2022   Procedure: APPLICATION OF WOUND VAC;  Surgeon: Felecia Shelling, DPM;  Location: [facility] OR;  Service: Podiatry;  Laterality: Right;   CORONARY ANGIOPLASTY WITH STENT PLACEMENT      IR FLUORO GUIDE CV LINE RIGHT  08/27/2022   IR US GUIDE VASC ACCESS RIGHT  08/27/2022   IRRIGATION AND DEBRIDEMENT FOOT Right 05/25/2022   Procedure: IRRIGATION AND DEBRIDEMENT FOOT;  Surgeon: Vivi Barrack, DPM;  Location: [facility] OR;  Service: Podiatry;  Laterality: Right;    No Known Allergies  Objective/Physical Exam Neurovascular status intact.  Incision well coapted with staples intact. No sign of infectious process noted. No dehiscence. No active bleeding noted.  Overall well-healing amputation site  Radiographic Exam RT foot 08/26/2022:  FINDINGS: There is been interval transmetatarsal amputation. Multiple surgical staples are seen. No acute bony abnormality is noted. Calcaneal spurring is seen. Wound VAC is noted laterally.   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  Assessment: 1. s/p transmetatarsal amputation right foot. DOS: 08/26/2022   Plan of Care:  -Patient was evaluated. X-rays reviewed that were taken 1 day postop - Overall well-healing surgical foot. -Continue cam boot.  WBAT with the assistance of a walker -Continue wound care orders at [facility] nursing facility: xeroform along the incision followed by 4x4, kerlix, ACE every other day and elevate foot.  -Return to clinic 2 weeks for staple removal   Felecia Shelling, DPM [facility]  Dr. Felecia Shelling, DPM    [address]  [place], [place] [address]                Office [phone]  Fax [phone]

## 2022-09-09 ENCOUNTER — Other Ambulatory Visit (HOSPITAL_COMMUNITY)
Admission: RE | Admit: 2022-09-09 | Discharge: 2022-09-09 | Disposition: A | Discharge status: Home / Self Care | Payer: Medicare Other | Source: Skilled Nursing Facility | Attending: Adult Health | Admitting: Adult Health

## 2022-09-09 DIAGNOSIS — M86171 Other acute osteomyelitis, right ankle and foot: Secondary | ICD-10-CM | POA: Insufficient documentation

## 2022-09-09 LAB — CBC WITH DIFFERENTIAL/PLATELET
Abs Immature Granulocytes: 0.04 10*3/uL (ref 0.00–0.07)
Basophils Absolute: 0.1 10*3/uL (ref 0.0–0.1)
Basophils Relative: 1 %
Eosinophils Absolute: 0.8 10*3/uL — ABNORMAL HIGH (ref 0.0–0.5)
Eosinophils Relative: 12 %
HCT: 36.1 % — ABNORMAL LOW (ref 39.0–52.0)
Hemoglobin: 11.6 g/dL — ABNORMAL LOW (ref 13.0–17.0)
Immature Granulocytes: 1 %
Lymphocytes Relative: 27 %
Lymphs Abs: 1.8 10*3/uL (ref 0.7–4.0)
MCH: 28.3 pg (ref 26.0–34.0)
MCHC: 32.1 g/dL (ref 30.0–36.0)
MCV: 88 fL (ref 80.0–100.0)
Monocytes Absolute: 0.5 10*3/uL (ref 0.1–1.0)
Monocytes Relative: 8 %
Neutro Abs: 3.4 10*3/uL (ref 1.7–7.7)
Neutrophils Relative %: 51 %
Platelets: 183 10*3/uL (ref 150–400)
RBC: 4.1 MIL/uL — ABNORMAL LOW (ref 4.22–5.81)
RDW: 16.1 % — ABNORMAL HIGH (ref 11.5–15.5)
WBC: 6.6 10*3/uL (ref 4.0–10.5)
nRBC: 0 % (ref 0.0–0.2)

## 2022-09-09 LAB — PHOSPHORUS: Phosphorus: 3.2 mg/dL (ref 2.5–4.6)

## 2022-09-09 LAB — COMPREHENSIVE METABOLIC PANEL
ALT: 13 U/L (ref 0–44)
AST: 15 U/L (ref 15–41)
Albumin: 3.4 g/dL — ABNORMAL LOW (ref 3.5–5.0)
Alkaline Phosphatase: 70 U/L (ref 38–126)
Anion gap: 10 (ref 5–15)
BUN: 24 mg/dL — ABNORMAL HIGH (ref 8–23)
CO2: 24 mmol/L (ref 22–32)
Calcium: 8.5 mg/dL — ABNORMAL LOW (ref 8.9–10.3)
Chloride: 105 mmol/L (ref 98–111)
Creatinine, Ser: 1.34 mg/dL — ABNORMAL HIGH (ref 0.61–1.24)
GFR, Estimated: 53 mL/min — ABNORMAL LOW (ref >60)
Glucose, Bld: 122 mg/dL — ABNORMAL HIGH (ref 70–99)
Potassium: 3.2 mmol/L — ABNORMAL LOW (ref 3.5–5.1)
Sodium: 139 mmol/L (ref 135–145)
Total Bilirubin: 0.8 mg/dL (ref 0.3–1.2)
Total Protein: 6.6 g/dL (ref 6.5–8.1)

## 2022-09-09 LAB — MAGNESIUM: Magnesium: 2.6 mg/dL — ABNORMAL HIGH (ref 1.7–2.4)

## 2022-09-17 ENCOUNTER — Encounter: Payer: Self-pay | Admitting: Adult Health

## 2022-09-17 NOTE — Progress Notes (Signed)
This encounter was created in error - please disregard.

## 2022-09-20 ENCOUNTER — Other Ambulatory Visit: Payer: Self-pay | Admitting: *Deleted

## 2022-09-20 ENCOUNTER — Encounter: Payer: Self-pay | Admitting: Internal Medicine

## 2022-09-20 ENCOUNTER — Non-Acute Institutional Stay (SKILLED_NURSING_FACILITY): Payer: Medicare Other | Admitting: Internal Medicine

## 2022-09-20 DIAGNOSIS — F01C0 Vascular dementia, severe, without behavioral disturbance, psychotic disturbance, mood disturbance, and anxiety: Secondary | ICD-10-CM | POA: Diagnosis not present

## 2022-09-20 DIAGNOSIS — E1151 Type 2 diabetes mellitus with diabetic peripheral angiopathy without gangrene: Secondary | ICD-10-CM | POA: Diagnosis not present

## 2022-09-20 DIAGNOSIS — I951 Orthostatic hypotension: Secondary | ICD-10-CM

## 2022-09-20 NOTE — Patient Outreach (Signed)
Per [facility] Wayne [name] resides in [facility]. Screening for potential [facility] care coordination services as benefit of health plan and Primary Care Provider.   Update received form [name], Data processing manager. Anticipated transition plan is to return home with support of family and private caregivers. Will remain in SNF for duration of IV abx.   Will continue to follow.   [name], MSN, RN,BSN [facility] Post Acute Care Coordinator [phone] (Direct dial)

## 2022-09-20 NOTE — Progress Notes (Signed)
   NURSING HOME LOCATION:  [facility] ROOM NUMBER:  151P  CODE STATUS:  DNR  [id number] Rakes FNP  This is a nursing facility follow up visit for specific acute issue of elevated glucoses.  Interim medical record and care since last SNF visit was updated with review of diagnostic studies and change in clinical status since last visit were documented.  HPI: Wayne [name] 09/19/2022 his family expressed concerns about his blood sugars being above 150.  They are of the understanding that his surgeon had stated that the blood sugar should stay below 150. Recorded glucose records dating back to 6/6 were reviewed.  Fasting glucoses have ranged from a low of 100 up to high 179.  Glucoses in the afternoon/evening have been as low as 120 but this tends to be an outlier and as high as 282.  Majority of the afternoon values are certainly over 150 and even over 200. The last A1c on record was 5.9% on 05/20/2022 indicating prediabetes state.   He is on Farxiga; glipizide 5 mg maintenance prior to admission to SNF was discontinued because of the risk of hypoglycemia as per the [facility] list. Most recent labs were performed 6/13 and revealed a potassium of 3.2 and creatinine of 1.34 with a GFR of 53 indicating CKD stage III CKD.  Review of systems: Dementia invalidated responses. Date given as Monday, March 28.  He could not give me the year.  He gave his age as 62.  He is 81.  He denies any diabetic symptoms. Family was monitoring glucoses at home and stated that the fasting blood sugar averaged approximately 120.  Physical exam:  Pertinent or positive findings: He is alert but oriented only to self.  Ptosis is present greater on the left.  He has an upper plate.  He has a few anterior mandibular teeth.  S1 is accentuated.  There is splitting of S2 intermittently.  Pedal pulses are not palpable.  The right lower extremity is in a boot.  General appearance: Adequately nourished; no acute distress,  increased work of breathing is present.   Lymphatic: No lymphadenopathy about the head, neck, axilla. Eyes: No conjunctival inflammation or lid edema is present. There is no scleral icterus. Ears:  External ear exam shows no significant lesions or deformities.   Nose:  External nasal examination shows no deformity or inflammation. Nasal mucosa are pink and moist without lesions, exudates Neck:  No thyromegaly, masses, tenderness noted.    Heart:  No gallop, murmur, click, rub .  Lungs: Chest clear to auscultation without wheezes, rhonchi, rales, rubs. Abdomen: Bowel sounds are normal. Abdomen is soft and nontender with no organomegaly, hernias, masses. GU: Deferred  Extremities:  No cyanosis, clubbing  Neurologic exam :Balance, Rhomberg, finger to nose testing could not be completed due to clinical state Skin: Warm & dry w/o tenting. No significant visible lesions or rash.  See summary under each active problem in the Problem List with associated updated therapeutic plan

## 2022-09-20 NOTE — Assessment & Plan Note (Signed)
Postural hypotension has resolved.  Because of persistent hypertension; metoprolol will be increased to 12.5 mg twice daily.

## 2022-09-20 NOTE — Patient Instructions (Signed)
See assessment and plan under each diagnosis in the problem list and acutely for this visit 

## 2022-09-20 NOTE — Assessment & Plan Note (Addendum)
Off glipizide and metformin for dysphagia maintenance; fasting glucose of the range from 100 up to 179.  Afternoon/evening glucoses have ranged from a low of 120 which is an outlier up to 282.  The majority are near or above 200. Low-dose basal insulin will be initiated. Recheck A1c.

## 2022-09-20 NOTE — Assessment & Plan Note (Signed)
He gave the date is Monday, March 28 but could not get the year.  Again his age is 15.  He is basically oriented only to self.

## 2022-09-21 ENCOUNTER — Other Ambulatory Visit (HOSPITAL_COMMUNITY)
Admission: RE | Admit: 2022-09-21 | Discharge: 2022-09-21 | Disposition: A | Discharge status: Home / Self Care | Payer: Medicare Other | Source: Skilled Nursing Facility | Attending: *Deleted | Admitting: *Deleted

## 2022-09-21 LAB — BASIC METABOLIC PANEL
Anion gap: 9 (ref 5–15)
BUN: 25 mg/dL — ABNORMAL HIGH (ref 8–23)
CO2: 24 mmol/L (ref 22–32)
Calcium: 8.5 mg/dL — ABNORMAL LOW (ref 8.9–10.3)
Chloride: 105 mmol/L (ref 98–111)
Creatinine, Ser: 1.52 mg/dL — ABNORMAL HIGH (ref 0.61–1.24)
GFR, Estimated: 45 mL/min — ABNORMAL LOW (ref >60)
Glucose, Bld: 157 mg/dL — ABNORMAL HIGH (ref 70–99)
Potassium: 3.4 mmol/L — ABNORMAL LOW (ref 3.5–5.1)
Sodium: 138 mmol/L (ref 135–145)

## 2022-09-21 LAB — HEMOGLOBIN A1C
Hgb A1c MFr Bld: 6.3 % — ABNORMAL HIGH (ref 4.8–5.6)
Mean Plasma Glucose: 134.11 mg/dL

## 2022-09-22 ENCOUNTER — Other Ambulatory Visit (HOSPITAL_COMMUNITY)
Admission: RE | Admit: 2022-09-22 | Discharge: 2022-09-22 | Disposition: A | Discharge status: Home / Self Care | Payer: Medicare Other | Source: Skilled Nursing Facility | Attending: *Deleted | Admitting: *Deleted

## 2022-09-22 LAB — BASIC METABOLIC PANEL
Anion gap: 10 (ref 5–15)
BUN: 25 mg/dL — ABNORMAL HIGH (ref 8–23)
CO2: 24 mmol/L (ref 22–32)
Calcium: 8.6 mg/dL — ABNORMAL LOW (ref 8.9–10.3)
Chloride: 104 mmol/L (ref 98–111)
Creatinine, Ser: 1.47 mg/dL — ABNORMAL HIGH (ref 0.61–1.24)
GFR, Estimated: 47 mL/min — ABNORMAL LOW (ref >60)
Glucose, Bld: 150 mg/dL — ABNORMAL HIGH (ref 70–99)
Potassium: 3.3 mmol/L — ABNORMAL LOW (ref 3.5–5.1)
Sodium: 138 mmol/L (ref 135–145)

## 2022-09-23 LAB — HEMOGLOBIN A1C
Hgb A1c MFr Bld: 6.7 % — ABNORMAL HIGH (ref 4.8–5.6)
Mean Plasma Glucose: 146 mg/dL

## 2022-09-24 ENCOUNTER — Ambulatory Visit: Payer: Medicare Other | Admitting: Podiatry

## 2022-09-24 DIAGNOSIS — L97514 Non-pressure chronic ulcer of other part of right foot with necrosis of bone: Secondary | ICD-10-CM

## 2022-09-24 MED ORDER — MUPIROCIN 2 % EX OINT: 1.0000 | TOPICAL_OINTMENT | Freq: Two times a day (BID) | CUTANEOUS | 1 refills | Status: DC

## 2022-09-24 NOTE — Progress Notes (Unsigned)
Chief Complaint  Patient presents with   Routine Post Op    Pt is here for post op visit on his right foot, he is not having anymore pain he says he feels good and has no concerns. Pt want his left foot to looked at too since a compression sock was put on him and was rubbing up against his 2nd toe.    Subjective:  Patient presents today status post midfoot amputation right with bone biopsy and tendo Achilles lengthening performed inpatient.  DOS: 08/26/2022.  Patient doing well.  No pain.  Currently NWB in a cam boot.  He is currently staying at [facility] nursing facility.  Past Medical History:  Diagnosis Date   Anemia of chronic disease    Chronic HFrEF (heart failure with reduced ejection fraction) (HCC)    Chronic kidney disease, stage 3a (HCC)    Coronary artery disease    Dr. Deborah Chalk years ago. No details.   Diabetes mellitus with circulatory complication (HCC)    Diabetic foot infection (HCC)    Encephalopathy    Former tobacco use    Lactic acidosis 11/28/2021   Multifocal pneumonia 11/28/2021   PAD (peripheral artery disease) (HCC)    Peripheral neuropathy    SDH (subdural hematoma) (HCC)    Severe pulmonary hypertension (HCC)     Past Surgical History:  Procedure Laterality Date   ABDOMINAL AORTOGRAM W/LOWER EXTREMITY Right 11/13/2021   Procedure: ABDOMINAL AORTOGRAM W/LOWER EXTREMITY;  Surgeon: Leonie Douglas, MD;  [facility];  Service: Cardiovascular;  Laterality: Right;   AMPUTATION Right 05/23/2022   Procedure: AMPUTATION RAY;  Surgeon: Pilar Plate, DPM;  Location: [facility] OR;  Service: Podiatry;  Laterality: Right;  Partial 5th ray amputation   AMPUTATION Right 08/26/2022   Procedure: MIDFOOT AMPUTATION RIGHT FOOT, BONE BIOPSY RIGHT FOOT, TENDO ACHILIES LENGTHENING;  Surgeon: Felecia Shelling, DPM;  Location: [facility];  Service: Podiatry;  Laterality: Right;   APPLICATION OF WOUND VAC Right 08/26/2022   Procedure: APPLICATION OF WOUND VAC;   Surgeon: Felecia Shelling, DPM;  [facility]: [facility] OR;  Service: Podiatry;  Laterality: Right;   CORONARY ANGIOPLASTY WITH STENT PLACEMENT     IR FLUORO GUIDE CV LINE RIGHT  08/27/2022   IR US GUIDE VASC ACCESS RIGHT  08/27/2022   IRRIGATION AND DEBRIDEMENT FOOT Right 05/25/2022   Procedure: IRRIGATION AND DEBRIDEMENT FOOT;  Surgeon: Vivi Barrack, DPM;  Location: [facility] OR;  Service: Podiatry;  Laterality: Right;    No Known Allergies  Objective/Physical Exam Neurovascular status intact.  Incision well coapted with staples intact. No sign of infectious process noted. No dehiscence. No active bleeding noted.  Overall well-healing amputation site  Radiographic Exam RT foot 08/26/2022:  FINDINGS: There is been interval transmetatarsal amputation. Multiple surgical staples are seen. No acute bony abnormality is noted. Calcaneal spurring is seen. Wound VAC is noted laterally.   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  Assessment: 1. s/p transmetatarsal amputation right foot. DOS: 08/26/2022   Plan of Care:  -Patient was evaluated. X-rays reviewed that were taken 1 day postop - Overall well-healing surgical foot. -Continue cam boot.  WBAT with the assistance of a walker -Continue wound care orders at [facility] nursing facility: xeroform along the incision followed by 4x4, kerlix, ACE every other day and elevate foot.  -Return to clinic 2 weeks for staple removal   Felecia Shelling, DPM [facility]  Dr. Felecia Shelling, DPM  [address], [place] [address]                Office [phone]  Fax [phone]

## 2022-09-25 DIAGNOSIS — M869 Osteomyelitis, unspecified: Secondary | ICD-10-CM | POA: Diagnosis not present

## 2022-09-25 DIAGNOSIS — E440 Moderate protein-calorie malnutrition: Secondary | ICD-10-CM | POA: Diagnosis not present

## 2022-09-25 DIAGNOSIS — E1159 Type 2 diabetes mellitus with other circulatory complications: Secondary | ICD-10-CM | POA: Diagnosis not present

## 2022-09-25 DIAGNOSIS — N1831 Chronic kidney disease, stage 3a: Secondary | ICD-10-CM | POA: Diagnosis not present

## 2022-09-28 ENCOUNTER — Encounter: Payer: Self-pay | Admitting: Adult Health

## 2022-09-28 ENCOUNTER — Non-Acute Institutional Stay (SKILLED_NURSING_FACILITY): Payer: Medicare Other | Admitting: Adult Health

## 2022-09-28 DIAGNOSIS — E1151 Type 2 diabetes mellitus with diabetic peripheral angiopathy without gangrene: Secondary | ICD-10-CM | POA: Diagnosis not present

## 2022-09-28 DIAGNOSIS — N183 Chronic kidney disease, stage 3 unspecified: Secondary | ICD-10-CM | POA: Diagnosis not present

## 2022-09-28 DIAGNOSIS — E1122 Type 2 diabetes mellitus with diabetic chronic kidney disease: Secondary | ICD-10-CM

## 2022-09-28 DIAGNOSIS — I129 Hypertensive chronic kidney disease with stage 1 through stage 4 chronic kidney disease, or unspecified chronic kidney disease: Secondary | ICD-10-CM | POA: Diagnosis not present

## 2022-09-28 NOTE — Progress Notes (Signed)
Location:  [facility] Room Number: 151 P Place of Service:  SNF (31)   CODE STATUS: DNR  No Known Allergies  Chief Complaint  Patient presents with   Acute Visit    Hypertension and diabetic concerns     HPI:  He has been started on lantus to help manage his diabetes. He is presently on farxiga 10 mg daily; and lantus 10 units nightly. His cbg readings remain elevated. His blood pressure readings are elevated as well is on toprol xl. There are no reports of headaches; no shortness of breath.   Past Medical History:  Diagnosis Date   Anemia of chronic disease    Chronic HFrEF (heart failure with reduced ejection fraction) (HCC)    Chronic kidney disease, stage 3a (HCC)    Coronary artery disease    Dr.  Chalk years ago. No details.   Diabetes mellitus with circulatory complication (HCC)    Diabetic foot infection (HCC)    Encephalopathy    Former tobacco use    Lactic acidosis 11/28/2021   Multifocal pneumonia 11/28/2021   PAD (peripheral artery disease) (HCC)    Peripheral neuropathy    SDH (subdural hematoma) (HCC)    Severe pulmonary hypertension (HCC)     Past Surgical History:  Procedure Laterality Date   ABDOMINAL AORTOGRAM W/LOWER EXTREMITY Right 11/13/2021   Procedure: ABDOMINAL AORTOGRAM W/LOWER EXTREMITY;  Surgeon: Leonie Douglas, MD;  [facility];  Service: Cardiovascular;  Laterality: Right;   AMPUTATION Right 05/23/2022   Procedure: AMPUTATION RAY;  Surgeon: Pilar Plate, DPM;  Location: [facility] OR;  Service: Podiatry;  Laterality: Right;  Partial 5th ray amputation   AMPUTATION Right 08/26/2022   Procedure: MIDFOOT AMPUTATION RIGHT FOOT, BONE BIOPSY RIGHT FOOT, TENDO ACHILIES LENGTHENING;  Surgeon: Felecia Shelling, DPM;  Location: [facility] OR;  Service: Podiatry;  Laterality: Right;   APPLICATION OF WOUND VAC Right 08/26/2022   Procedure: APPLICATION OF WOUND VAC;  Surgeon: Felecia Shelling, DPM;  Location: [facility] OR;   Service: Podiatry;  Laterality: Right;   CORONARY ANGIOPLASTY WITH STENT PLACEMENT     IR FLUORO GUIDE CV LINE RIGHT  08/27/2022   IR US GUIDE VASC ACCESS RIGHT  08/27/2022   IRRIGATION AND DEBRIDEMENT FOOT Right 05/25/2022   Procedure: IRRIGATION AND DEBRIDEMENT FOOT;  Surgeon: Vivi Barrack, DPM;  Location: [facility];  Service: Podiatry;  Laterality: Right;    Social History   Socioeconomic History   Marital status: Divorced    Spouse name: Not on file   Number of children: Not on file   Years of education: Not on file   Highest education level: Not on file  Occupational History   Not on file  Tobacco Use   Smoking status: Former    Types: Cigarettes    Quit date: 50    Years since quitting: 31.5   Smokeless tobacco: Never  Vaping Use   Vaping Use: Never used  Substance and Sexual Activity   Alcohol use: No   Drug use: No   Sexual activity: Not Currently  Other Topics Concern   Not on file  Social History Narrative   Not on file   Social Determinants of Health   Financial Resource Strain: Low Risk  (05/05/2022)   Overall Financial Resource Strain (CARDIA)    Difficulty of Paying Living Expenses: Not hard at all  Food Insecurity: No Food Insecurity (05/22/2022)   Hunger Vital Sign    Worried About Running  Out of Food in the Last Year: Never true    Ran Out of Food in the Last Year: Never true  Transportation Needs: No Transportation Needs (05/22/2022)   PRAPARE - Administrator, Civil Service (Medical): No    Lack of Transportation (Non-Medical): No  Physical Activity: Inactive (05/05/2022)   Exercise Vital Sign    Days of Exercise per Week: 0 days    Minutes of Exercise per Session: 0 min  Stress: No Stress Concern Present (05/05/2022)   [employer] of Occupational Health - Occupational Stress Questionnaire    Feeling of Stress : Not at all  Social Connections: Socially Isolated (05/05/2022)   Social Connection and Isolation Panel [NHANES]     Frequency of Communication with Friends and Family: More than three times a week    Frequency of Social Gatherings with Friends and Family: More than three times a week    Attends Religious Services: Never    Database administrator or Organizations: No    Attends Banker Meetings: Never    Marital Status: Divorced  Catering manager Violence: Not At Risk (05/22/2022)   Humiliation, Afraid, Rape, and Kick questionnaire    Fear of Current or Ex-Partner: No    Emotionally Abused: No    Physically Abused: No    Sexually Abused: No   Family History  Problem Relation Age of Onset   Cervical cancer Mother    Down syndrome Sister    Diabetes type II Neg Hx       VITAL SIGNS BP (!) 170/66   Pulse (!) 58   Temp (!) 97.5 F (36.4 C)   Resp 18   Ht 6' (1.829 m)   Wt 181 lb (82.1 kg)   SpO2 97%   BMI 24.55 kg/m   Outpatient Encounter Medications as of 09/28/2022  Medication Sig   acetaminophen (TYLENOL) 325 MG tablet Take 2 tablets (650 mg total) by mouth every 6 (six) hours as needed for mild pain (or Fever >/= 101).   bisacodyl (DULCOLAX) 10 MG suppository Place 1 suppository (10 mg total) rectally daily as needed for moderate constipation.   cyanocobalamin (VITAMIN B12) 1000 MCG tablet Take 1,000 mcg by mouth daily.   dapagliflozin propanediol (FARXIGA) 10 MG TABS tablet Take 1 tablet (10 mg total) by mouth daily.   furosemide (LASIX) 40 MG tablet Take 1 tablet (40 mg total) by mouth daily.   Heparin Sod, Pork, Lock Flush (HEPARIN, PORCINE, LOCK FLUSH IV) Inject 3 mLs into the vein every 6 (six) hours.   hydrocortisone cream 0.5 % Apply 1 Application topically 2 (two) times daily.   insulin glargine (LANTUS SOLOSTAR) 100 UNIT/ML Solostar Pen Inject 15 Units into the skin daily.   metoprolol succinate (TOPROL-XL) 25 MG 24 hr tablet Take 12.5 mg by mouth daily.   Multiple Vitamin (MULTIVITAMIN WITH MINERALS) TABS tablet Take 1 tablet by mouth daily.   Nutritional  Supplements (ENSURE CLEAR PO) Take 1 Bottle by mouth 2 (two) times daily.   nystatin cream (MYCOSTATIN) Apply topically 2 (two) times daily.   piperacillin-tazobactam (ZOSYN) IVPB Inject 3.375 g into the vein every 6 (six) hours. Indication:  R-foot osteo First Dose: Yes Last Day of Therapy:  10/07/22 Labs - Once weekly:  CBC/D and BMP, ESR and CRP Method of administration: Intermittent infusions over 30 minutes Pull PICC line at the completion of IV antibiotics Method of administration may be changed at the discretion of home infusion pharmacist based upon  assessment of the patient and/or caregiver's ability to self-administer the medication ordered.   polyethylene glycol (MIRALAX / GLYCOLAX) 17 g packet Take 17 g by mouth daily as needed.   potassium chloride (KLOR-CON M) 10 MEQ tablet Take 10 mEq by mouth daily.   Protein (PROSOURCE PO) Take 30 mLs by mouth 3 (three) times daily.   senna-docusate (SENOKOT-S) 8.6-50 MG tablet Take 1 tablet by mouth 2 (two) times daily.   sodium chloride irrigation 0.9 % irrigation Irrigate with 5 mLs as directed every 6 (six) hours. After IV ABO completion and then follow with the heparin flush   Accu-Chek Softclix Lancets lancets Use 4 times daily as directed to check blood sugars.   [DISCONTINUED] ascorbic acid (VITAMIN C) 500 MG tablet Take 1 tablet (500 mg total) by mouth 2 (two) times daily.   [DISCONTINUED] mupirocin ointment (BACTROBAN) 2 % Apply 1 Application topically 2 (two) times daily.   [DISCONTINUED] polyethylene glycol (MIRALAX / GLYCOLAX) 17 g packet Take 17 g by mouth 2 (two) times daily.   [DISCONTINUED] zinc sulfate 220 (50 Zn) MG capsule Take 1 capsule (220 mg total) by mouth daily. For 14 days   No facility-administered encounter medications on file as of 09/28/2022.     SIGNIFICANT DIAGNOSTIC EXAMS  PREVIOUS   08-19-22: hgb A1c 6.7 08-24-22: wbc 5.5; hgb 14.9; hct 47.3; mcv 86.0 plt 205; glucose 150; bun 35; creat 1.53; k+ 4.4; na++  135; ca 9.1; gfr 45; protein 8.9 albumin 3.7; blood culture no growth 08-26-22: wbc 7.3; hgb 14.3; hct 42.7; mcv 82.3 plt 209; glucose 116; bun 35; creat 1.83; k+ 3.8; na++ 140; ca 8.9; gfr 36; protein 7.1 albumin 3.4 phos 4.2 mag 2.7 08-28-22: wbc 7.7; hgb 11.9; hct 36.6; mcv 84.3 plt 178; glucose 166; bun 33; creat 1.68; k+ 3.5; na++ 136; ca 8.2; gfr 40; protein 6.2 albumin 3.0 mag 2.5 phos 3.7 08-29-22: wbc 7.3; hgb 11.6; hct 35.8; mcv 84.4 plt 163; glucose 125; bun 22; creat 1.46; k+ 3.5; na++ 141; ca 8.3 gfr 47; protein 6.2 albumin 2.9; mag 2.4 phos 2.9; vitamin B12: 273; iron 23; tibc 199; ferritin 152; folate 11.9 09-01-22: wbc 5.6; hgb 10.8; hct 33.4; mcv 86.8 plt 170; glucose 164; bun 17; creat 1.16; k+ 3.6; na++ 140; ca 8.3 gfr >60; protein 5.9; albumin 2.6; mag 2.2 phos 2.4   TODAY  09-09-22: wbc 6.6; hgb 11.6; hct 36.1; mcv 88.0 plt 183; glucose 122; bun 24; creat 1.34; k+ 3.2; na++ 139;  ca 8.5; gfr 53; protein 6.6 albumin 3.4; mag 2.6 phos 3.2 09-21-22: glucose 157; bun 25; creat 1.52; k+ 3.4; na++ 138; ca 8.5 gfr 45; hgb A1c 6.3 09-22-22: glucose 150; bun 25; creat 1.47; k+ 3.3; na++ 138; ca 8.6; gfr 47; hgb A1c 6.7   Review of Systems  Constitutional:  Negative for malaise/fatigue.  Respiratory:  Negative for cough and shortness of breath.   Cardiovascular:  Negative for chest pain, palpitations and leg swelling.  Gastrointestinal:  Negative for abdominal pain, constipation and heartburn.  Musculoskeletal:  Negative for back pain, joint pain and myalgias.  Skin: Negative.   Neurological:  Negative for dizziness.  Psychiatric/Behavioral:  The patient is not nervous/anxious.    Physical Exam Constitutional:      General: He is not in acute distress.    Appearance: He is well-developed. He is not diaphoretic.  Neck:     Thyroid: No thyromegaly.  Cardiovascular:     Rate and Rhythm: Normal rate and regular  rhythm.     Pulses: Normal pulses.     Heart sounds: Normal heart sounds.   Pulmonary:     Effort: Pulmonary effort is normal. No respiratory distress.     Breath sounds: Normal breath sounds.  Abdominal:     General: Bowel sounds are normal. There is no distension.     Palpations: Abdomen is soft.     Tenderness: There is no abdominal tenderness.  Musculoskeletal:        General: Normal range of motion.     Cervical back: Neck supple.     Right lower leg: No edema.     Left lower leg: No edema.     Comments: Cam boot  Lymphadenopathy:     Cervical: No cervical adenopathy.  Skin:    General: Skin is warm and dry.  Neurological:     Mental Status: He is alert and oriented to person, place, and time.  Psychiatric:        Mood and Affect: Mood normal.      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

## 2022-09-29 ENCOUNTER — Other Ambulatory Visit (HOSPITAL_COMMUNITY)
Admission: RE | Admit: 2022-09-29 | Discharge: 2022-09-29 | Disposition: A | Discharge status: Home / Self Care | Payer: Medicare Other | Source: Skilled Nursing Facility | Attending: Internal Medicine | Admitting: Internal Medicine

## 2022-09-29 DIAGNOSIS — D631 Anemia in chronic kidney disease: Secondary | ICD-10-CM | POA: Insufficient documentation

## 2022-09-29 LAB — BASIC METABOLIC PANEL
Anion gap: 9 (ref 5–15)
BUN: 24 mg/dL — ABNORMAL HIGH (ref 8–23)
CO2: 24 mmol/L (ref 22–32)
Calcium: 8.6 mg/dL — ABNORMAL LOW (ref 8.9–10.3)
Chloride: 107 mmol/L (ref 98–111)
Creatinine, Ser: 1.57 mg/dL — ABNORMAL HIGH (ref 0.61–1.24)
GFR, Estimated: 43 mL/min — ABNORMAL LOW (ref >60)
Glucose, Bld: 105 mg/dL — ABNORMAL HIGH (ref 70–99)
Potassium: 3.3 mmol/L — ABNORMAL LOW (ref 3.5–5.1)
Sodium: 140 mmol/L (ref 135–145)

## 2022-10-04 ENCOUNTER — Other Ambulatory Visit (HOSPITAL_COMMUNITY)
Admission: RE | Admit: 2022-10-04 | Discharge: 2022-10-04 | Disposition: A | Discharge status: Home / Self Care | Payer: Medicare Other | Source: Skilled Nursing Facility | Attending: Adult Health | Admitting: Adult Health

## 2022-10-04 DIAGNOSIS — I129 Hypertensive chronic kidney disease with stage 1 through stage 4 chronic kidney disease, or unspecified chronic kidney disease: Secondary | ICD-10-CM | POA: Insufficient documentation

## 2022-10-04 LAB — BASIC METABOLIC PANEL
Anion gap: 8 (ref 5–15)
BUN: 21 mg/dL (ref 8–23)
CO2: 23 mmol/L (ref 22–32)
Calcium: 8.3 mg/dL — ABNORMAL LOW (ref 8.9–10.3)
Chloride: 108 mmol/L (ref 98–111)
Creatinine, Ser: 1.41 mg/dL — ABNORMAL HIGH (ref 0.61–1.24)
GFR, Estimated: 49 mL/min — ABNORMAL LOW (ref >60)
Glucose, Bld: 105 mg/dL — ABNORMAL HIGH (ref 70–99)
Potassium: 3.2 mmol/L — ABNORMAL LOW (ref 3.5–5.1)
Sodium: 139 mmol/L (ref 135–145)

## 2022-10-05 ENCOUNTER — Other Ambulatory Visit: Payer: Self-pay | Admitting: *Deleted

## 2022-10-05 ENCOUNTER — Telehealth: Payer: Self-pay | Admitting: *Deleted

## 2022-10-05 NOTE — Patient Outreach (Addendum)
Per [facility] Wayne [name] resides in [facility]  skilled nursing facility. Screening for potential [facility] care coordination services as benefit of health plan and Primary Care Provider.   Previous update received from Wayne [name], [place] [facility] social worker indicated plan is to return home. Primary contact is daughter Wayne [name]. IV abx to be completed on 10/07/22.  Telephone call made to Wayne [name] (daughter) [phone]. Patient identifiers confirmed. Explained care coordination services. Wayne [name] reports " we are in good shape and got a plan.  We have been down this road before."  States Wayne [name] foot is healing. States they manage his medications, chronic conditions, and appointments. States both she and her mother will check in and assist Wayne [name] when he returns home. Wayne [name] denies Wayne [name] having any care coordination needs at this time.   Writer to follow up with Wayne [name] about home health arrangements. Will follow for transition date.   Addendum: Update received from Wayne [name] Nursing social worker. Wayne [name] with have [facility]. Likely transition home on Friday if IV abx are not extended.   Wayne Noble, MSN, RN,BSN [facility] Post Acute Care Coordinator [phone] (Direct dial)

## 2022-10-05 NOTE — Telephone Encounter (Signed)
Tammy w/ [facility] is calling to approval to pull pic line, has an upcoming appointment tomorrow, should they wait until he is seen in office in case another antibiotic is needed, please advise.

## 2022-10-06 ENCOUNTER — Ambulatory Visit: Payer: Medicare Other | Admitting: Podiatry

## 2022-10-06 ENCOUNTER — Non-Acute Institutional Stay (SKILLED_NURSING_FACILITY): Payer: Medicare Other | Admitting: Adult Health

## 2022-10-06 ENCOUNTER — Encounter: Payer: Self-pay | Admitting: Adult Health

## 2022-10-06 ENCOUNTER — Other Ambulatory Visit: Payer: Self-pay | Admitting: Family Medicine

## 2022-10-06 ENCOUNTER — Other Ambulatory Visit: Payer: Self-pay | Admitting: Adult Health

## 2022-10-06 DIAGNOSIS — E1159 Type 2 diabetes mellitus with other circulatory complications: Secondary | ICD-10-CM

## 2022-10-06 DIAGNOSIS — N183 Chronic kidney disease, stage 3 unspecified: Secondary | ICD-10-CM

## 2022-10-06 DIAGNOSIS — I5043 Acute on chronic combined systolic (congestive) and diastolic (congestive) heart failure: Secondary | ICD-10-CM

## 2022-10-06 DIAGNOSIS — Z66 Do not resuscitate: Secondary | ICD-10-CM

## 2022-10-06 DIAGNOSIS — E1122 Type 2 diabetes mellitus with diabetic chronic kidney disease: Secondary | ICD-10-CM

## 2022-10-06 DIAGNOSIS — E1151 Type 2 diabetes mellitus with diabetic peripheral angiopathy without gangrene: Secondary | ICD-10-CM

## 2022-10-06 DIAGNOSIS — I152 Hypertension secondary to endocrine disorders: Secondary | ICD-10-CM

## 2022-10-06 DIAGNOSIS — I700 Atherosclerosis of aorta: Secondary | ICD-10-CM | POA: Diagnosis not present

## 2022-10-06 DIAGNOSIS — M86171 Other acute osteomyelitis, right ankle and foot: Secondary | ICD-10-CM

## 2022-10-06 DIAGNOSIS — L97514 Non-pressure chronic ulcer of other part of right foot with necrosis of bone: Secondary | ICD-10-CM

## 2022-10-06 DIAGNOSIS — I5032 Chronic diastolic (congestive) heart failure: Secondary | ICD-10-CM | POA: Diagnosis not present

## 2022-10-06 MED ORDER — METOPROLOL SUCCINATE ER 25 MG PO TB24: 12.5000 mg | ORAL_TABLET | Freq: Two times a day (BID) | ORAL | 0 refills | Status: DC

## 2022-10-06 MED ORDER — POTASSIUM CHLORIDE CRYS ER 10 MEQ PO TBCR: 10.0000 meq | EXTENDED_RELEASE_TABLET | Freq: Every day | ORAL | 0 refills | Status: DC

## 2022-10-06 MED ORDER — LISINOPRIL 5 MG PO TABS: 5.0000 mg | ORAL_TABLET | Freq: Every day | ORAL | 0 refills | Status: DC

## 2022-10-06 MED ORDER — LANTUS SOLOSTAR 100 UNIT/ML ~~LOC~~ SOPN: 15.0000 [IU] | PEN_INJECTOR | Freq: Every day | SUBCUTANEOUS | 0 refills | Status: DC

## 2022-10-06 MED ORDER — DAPAGLIFLOZIN PROPANEDIOL 10 MG PO TABS
10.0000 mg | ORAL_TABLET | Freq: Every day | ORAL | 0 refills | Status: DC
Start: 2022-10-06 — End: 2022-12-10

## 2022-10-06 MED ORDER — FUROSEMIDE 40 MG PO TABS: 40.0000 mg | ORAL_TABLET | Freq: Every day | ORAL | 0 refills | Status: DC

## 2022-10-06 NOTE — Progress Notes (Signed)
   No chief complaint on file.   Subjective:  Patient presents today status post midfoot amputation right with bone biopsy and tendo Achilles lengthening performed inpatient.  DOS: 08/26/2022.  Patient continues to do well.  No new complaints.  He is still at the [facility] nursing facility  Past Medical History:  Diagnosis Date   Anemia of chronic disease    Chronic HFrEF (heart failure with reduced ejection fraction) (HCC)    Chronic kidney disease, stage 3a (HCC)    Coronary artery disease    Dr. Deborah Chalk years ago. No details.   Diabetes mellitus with circulatory complication (HCC)    Diabetic foot infection (HCC)    Encephalopathy    Former tobacco use    Lactic acidosis 11/28/2021   Multifocal pneumonia 11/28/2021   PAD (peripheral artery disease) (HCC)    Peripheral neuropathy    SDH (subdural hematoma) (HCC)    Severe pulmonary hypertension (HCC)     Past Surgical History:  Procedure Laterality Date   ABDOMINAL AORTOGRAM W/LOWER EXTREMITY Right 11/13/2021   Procedure: ABDOMINAL AORTOGRAM W/LOWER EXTREMITY;  Surgeon: Leonie Douglas, MD;  [facility];  Service: Cardiovascular;  Laterality: Right;   AMPUTATION Right 05/23/2022   Procedure: AMPUTATION RAY;  Surgeon: Pilar Plate, DPM;  [facility]: [facility];  Service: Podiatry;  Laterality: Right;  Partial 5th ray amputation   AMPUTATION Right 08/26/2022   Procedure: MIDFOOT AMPUTATION RIGHT FOOT, BONE BIOPSY RIGHT FOOT, TENDO ACHILIES LENGTHENING;  Surgeon: Felecia Shelling, DPM;  Location: [facility] OR;  Service: Podiatry;  Laterality: Right;   APPLICATION OF WOUND VAC Right 08/26/2022   Procedure: APPLICATION OF WOUND VAC;  Surgeon: Felecia Shelling, DPM;  Location: [facility];  Service: Podiatry;  Laterality: Right;   CORONARY ANGIOPLASTY WITH STENT PLACEMENT     IR FLUORO GUIDE CV LINE RIGHT  08/27/2022   IR US GUIDE VASC ACCESS RIGHT  08/27/2022   IRRIGATION AND DEBRIDEMENT FOOT Right 05/25/2022   Procedure:  IRRIGATION AND DEBRIDEMENT FOOT;  Surgeon: Vivi Barrack, DPM;  Location: [facility];  Service: Podiatry;  Laterality: Right;    No Known Allergies  Objective/Physical Exam Neurovascular status intact.  Incision well coapted with staples intact. No sign of infectious process noted. No dehiscence. No active bleeding noted.  Overall well-healing amputation site  Radiographic Exam RT foot 08/26/2022:  FINDINGS: There is been interval transmetatarsal amputation. Multiple surgical staples are seen. No acute bony abnormality is noted. Calcaneal spurring is seen. Wound VAC is noted laterally.   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  Assessment: 1. s/p transmetatarsal amputation right foot. DOS: 08/26/2022   Plan of Care:  -Patient was evaluated.  -Sutures and staples removed today -Continue cam boot.  WBAT with the assistance of a walker -Continue wound care orders at [facility] nursing facility: xeroform along the incision followed by 4x4, kerlix, ACE every other day and elevate foot.  - Return to clinic 4 weeks   Felecia Shelling, DPM [facility]  Dr. Felecia Shelling, DPM    [address] [place], [place] [address]                Office [phone]  Fax [phone]

## 2022-10-06 NOTE — Progress Notes (Signed)
Location:  [facility] Room Number: 151 P Place of Service:  SNF (31)   CODE STATUS: DNR  No Known Allergies  Chief Complaint  Patient presents with   Discharge Note    Discharge from [facility]    HPI:  He is being discharged to home with home health for pt/ot/rn. He does not need any dme. He will need to follow up with his medical provider. He has been hospitalized for right foot osteomyelitis. He was admitted here for short term rehab and IV abt. Therapy: ambulates 200 feet with rolling walker with supervision. Upper body: supervision; lower body: contact guard. Stand/pivot: supervision. Brp: supervision to contact guard.   Past Medical History:  Diagnosis Date   Anemia of chronic disease    Chronic HFrEF (heart failure with reduced ejection fraction) (HCC)    Chronic kidney disease, stage 3a (HCC)    Coronary artery disease    Dr.  Chalk years ago. No details.   Diabetes mellitus with circulatory complication (HCC)    Diabetic foot infection (HCC)    Encephalopathy    Former tobacco use    Lactic acidosis 11/28/2021   Multifocal pneumonia 11/28/2021   PAD (peripheral artery disease) (HCC)    Peripheral neuropathy    SDH (subdural hematoma) (HCC)    Severe pulmonary hypertension (HCC)     Past Surgical History:  Procedure Laterality Date   ABDOMINAL AORTOGRAM W/LOWER EXTREMITY Right 11/13/2021   Procedure: ABDOMINAL AORTOGRAM W/LOWER EXTREMITY;  Surgeon: Leonie Douglas, MD;  Location: [facility] INVASIVE CV LAB;  Service: Cardiovascular;  Laterality: Right;   AMPUTATION Right 05/23/2022   Procedure: AMPUTATION RAY;  Surgeon: Pilar Plate, DPM;  Location: [facility] OR;  Service: Podiatry;  Laterality: Right;  Partial 5th ray amputation   AMPUTATION Right 08/26/2022   Procedure: MIDFOOT AMPUTATION RIGHT FOOT, BONE BIOPSY RIGHT FOOT, TENDO ACHILIES LENGTHENING;  Surgeon: Felecia Shelling, DPM;  Location: [facility] OR;  Service: Podiatry;  Laterality:  Right;   APPLICATION OF WOUND VAC Right 08/26/2022   Procedure: APPLICATION OF WOUND VAC;  Surgeon: Felecia Shelling, DPM;  Location: [facility] OR;  Service: Podiatry;  Laterality: Right;   CORONARY ANGIOPLASTY WITH STENT PLACEMENT     IR FLUORO GUIDE CV LINE RIGHT  08/27/2022   IR US GUIDE VASC ACCESS RIGHT  08/27/2022   IRRIGATION AND DEBRIDEMENT FOOT Right 05/25/2022   Procedure: IRRIGATION AND DEBRIDEMENT FOOT;  Surgeon: Vivi Barrack, DPM;  Location: [facility] OR;  Service: Podiatry;  Laterality: Right;    Social History   Socioeconomic History   Marital status: Divorced    Spouse name: Not on file   Number of children: Not on file   Years of education: Not on file   Highest education level: Not on file  Occupational History   Not on file  Tobacco Use   Smoking status: Former    Types: Cigarettes    Quit date: 61    Years since quitting: 31.5   Smokeless tobacco: Never  Vaping Use   Vaping Use: Never used  Substance and Sexual Activity   Alcohol use: No   Drug use: No   Sexual activity: Not Currently  Other Topics Concern   Not on file  Social History Narrative   Not on file   Social Determinants of Health   Financial Resource Strain: Low Risk  (05/05/2022)   Overall Financial Resource Strain (CARDIA)    Difficulty of Paying Living Expenses: Not hard at all  Food Insecurity: No Food Insecurity (05/22/2022)   Hunger Vital Sign    Worried About Running Out of Food in the Last Year: Never true    Ran Out of Food in the Last Year: Never true  Transportation Needs: No Transportation Needs (05/22/2022)   PRAPARE - Administrator, Civil Service (Medical): No    Lack of Transportation (Non-Medical): No  Physical Activity: Inactive (05/05/2022)   Exercise Vital Sign    Days of Exercise per Week: 0 days    Minutes of Exercise per Session: 0 min  Stress: No Stress Concern Present (05/05/2022)   [employer] of Occupational Health - Occupational Stress Questionnaire     Feeling of Stress : Not at all  Social Connections: Socially Isolated (05/05/2022)   Social Connection and Isolation Panel [NHANES]    Frequency of Communication with Friends and Family: More than three times a week    Frequency of Social Gatherings with Friends and Family: More than three times a week    Attends Religious Services: Never    Database administrator or Organizations: No    Attends Banker Meetings: Never    Marital Status: Divorced  Catering manager Violence: Not At Risk (05/22/2022)   Humiliation, Afraid, Rape, and Kick questionnaire    Fear of Current or Ex-Partner: No    Emotionally Abused: No    Physically Abused: No    Sexually Abused: No   Family History  Problem Relation Age of Onset   Cervical cancer Mother    Down syndrome Sister    Diabetes type II Neg Hx       VITAL SIGNS BP 111/69   Pulse 60   Temp 97.8 F (36.6 C)   Resp 20   Ht 6' (1.829 m)   Wt 181 lb (82.1 kg)   SpO2 97%   BMI 24.55 kg/m   Outpatient Encounter Medications as of 10/06/2022  Medication Sig   Accu-Chek Softclix Lancets lancets Use 4 times daily as directed to check blood sugars.   acetaminophen (TYLENOL) 325 MG tablet Take 2 tablets (650 mg total) by mouth every 6 (six) hours as needed for mild pain (or Fever >/= 101).   bisacodyl (DULCOLAX) 10 MG suppository Place 1 suppository (10 mg total) rectally daily as needed for moderate constipation.   cyanocobalamin (VITAMIN B12) 1000 MCG tablet Take 1,000 mcg by mouth daily.   dapagliflozin propanediol (FARXIGA) 10 MG TABS tablet Take 1 tablet (10 mg total) by mouth daily.   furosemide (LASIX) 40 MG tablet TAKE 1 TABLET BY MOUTH EVERY DAY   insulin glargine (LANTUS SOLOSTAR) 100 UNIT/ML Solostar Pen Inject 15 Units into the skin daily.   lisinopril (ZESTRIL) 5 MG tablet Take 1 tablet (5 mg total) by mouth daily.   metoprolol succinate (TOPROL-XL) 25 MG 24 hr tablet Take 0.5 tablets (12.5 mg total) by mouth 2 (two) times  daily.   miconazole (MICOTIN) 2 % cream Apply 1 Application topically 2 (two) times daily.   Nutritional Supplements (ENSURE CLEAR PO) Take 1 Bottle by mouth 2 (two) times daily.   piperacillin-tazobactam (ZOSYN) IVPB Inject 3.375 g into the vein every 6 (six) hours. Indication:  R-foot osteo First Dose: Yes Last Day of Therapy:  10/07/22 Labs - Once weekly:  CBC/D and BMP, ESR and CRP Method of administration: Intermittent infusions over 30 minutes Pull PICC line at the completion of IV antibiotics Method of administration may be changed at the discretion of home infusion  pharmacist based upon assessment of the patient and/or caregiver's ability to self-administer the medication ordered.   polyethylene glycol (MIRALAX / GLYCOLAX) 17 g packet Take 17 g by mouth daily as needed.   potassium chloride (KLOR-CON M) 10 MEQ tablet Take 1 tablet (10 mEq total) by mouth daily.   Protein (PROSOURCE PO) Take 30 mLs by mouth 3 (three) times daily.   senna-docusate (SENOKOT-S) 8.6-50 MG tablet Take 1 tablet by mouth 2 (two) times daily.   sodium chloride irrigation 0.9 % irrigation Irrigate with 5 mLs as directed every 6 (six) hours. After IV ABO completion and then follow with the heparin flush   No facility-administered encounter medications on file as of 10/06/2022.     SIGNIFICANT DIAGNOSTIC EXAMS   PREVIOUS   08-19-22: hgb A1c 6.7 08-24-22: wbc 5.5; hgb 14.9; hct 47.3; mcv 86.0 plt 205; glucose 150; bun 35; creat 1.53; k+ 4.4; na++ 135; ca 9.1; gfr 45; protein 8.9 albumin 3.7; blood culture no growth 08-26-22: wbc 7.3; hgb 14.3; hct 42.7; mcv 82.3 plt 209; glucose 116; bun 35; creat 1.83; k+ 3.8; na++ 140; ca 8.9; gfr 36; protein 7.1 albumin 3.4 phos 4.2 mag 2.7 08-28-22: wbc 7.7; hgb 11.9; hct 36.6; mcv 84.3 plt 178; glucose 166; bun 33; creat 1.68; k+ 3.5; na++ 136; ca 8.2; gfr 40; protein 6.2 albumin 3.0 mag 2.5 phos 3.7 08-29-22: wbc 7.3; hgb 11.6; hct 35.8; mcv 84.4 plt 163; glucose 125; bun 22;  creat 1.46; k+ 3.5; na++ 141; ca 8.3 gfr 47; protein 6.2 albumin 2.9; mag 2.4 phos 2.9; vitamin B12: 273; iron 23; tibc 199; ferritin 152; folate 11.9 09-01-22: wbc 5.6; hgb 10.8; hct 33.4; mcv 86.8 plt 170; glucose 164; bun 17; creat 1.16; k+ 3.6; na++ 140; ca 8.3 gfr >60; protein 5.9; albumin 2.6; mag 2.2 phos 2.4  09-09-22: wbc 6.6; hgb 11.6; hct 36.1; mcv 88.0 plt 183; glucose 122; bun 24; creat 1.34; k+ 3.2; na++ 139;  ca 8.5; gfr 53; protein 6.6 albumin 3.4; mag 2.6 phos 3.2 09-21-22: glucose 157; bun 25; creat 1.52; k+ 3.4; na++ 138; ca 8.5 gfr 45; hgb A1c 6.3 09-22-22: glucose 150; bun 25; creat 1.47; k+ 3.3; na++ 138; ca 8.6; gfr 47; hgb A1c 6.7  NO NEW LABS.    Review of Systems  Constitutional:  Negative for malaise/fatigue.  Respiratory:  Negative for cough and shortness of breath.   Cardiovascular:  Negative for chest pain, palpitations and leg swelling.  Gastrointestinal:  Negative for abdominal pain, constipation and heartburn.  Musculoskeletal:  Negative for back pain, joint pain and myalgias.  Skin: Negative.   Neurological:  Negative for dizziness.  Psychiatric/Behavioral:  The patient is not nervous/anxious.    Physical Exam Constitutional:      General: He is not in acute distress.    Appearance: He is well-developed. He is not diaphoretic.  Neck:     Thyroid: No thyromegaly.  Cardiovascular:     Rate and Rhythm: Normal rate and regular rhythm.     Pulses: Normal pulses.     Heart sounds: Normal heart sounds.  Pulmonary:     Effort: Pulmonary effort is normal. No respiratory distress.     Breath sounds: Normal breath sounds.  Abdominal:     General: Bowel sounds are normal. There is no distension.     Palpations: Abdomen is soft.     Tenderness: There is no abdominal tenderness.  Musculoskeletal:        General: Normal range of motion.  Cervical back: Neck supple.     Right lower leg: No edema.     Left lower leg: No edema.     Comments: S/p right ray  amputation   Lymphadenopathy:     Cervical: No cervical adenopathy.  Skin:    General: Skin is warm and dry.  Neurological:     Mental Status: He is alert and oriented to person, place, and time.  Psychiatric:        Mood and Affect: Mood normal.       ASSESSMENT/ PLAN:   Patient is being discharged with the following home health services:  pt/ot/rn: to evaluate and treat as indicated for gait balance strength adl training medication management   Patient is being discharged with the following durable medical equipment:  none needed   Patient has been advised to f/u with their PCP in 1-2 weeks to for a transitions of care visit.  Social services at their facility was responsible for arranging this appointment.  Pt was provided with adequate prescriptions of noncontrolled medications to reach the scheduled appointment .  For controlled substances, a limited supply was provided as appropriate for the individual patient.  If the pt normally receives these medications from a pain clinic or has a contract with another physician, these medications should be received from that clinic or physician only).    A 30 day supply of his prescription medications have been sent to [facility]   Time spent with patient 40 minutes: home health; dme; medications.    Synthia Innocent NP [facility]   call [phone]

## 2022-10-07 ENCOUNTER — Other Ambulatory Visit: Payer: Self-pay

## 2022-10-07 ENCOUNTER — Ambulatory Visit: Payer: Medicare Other | Admitting: Internal Medicine

## 2022-10-07 ENCOUNTER — Other Ambulatory Visit (HOSPITAL_COMMUNITY): Payer: Self-pay | Admitting: Internal Medicine

## 2022-10-07 VITALS — BP 204/83 | HR 55 | Temp 98.0°F

## 2022-10-07 DIAGNOSIS — M86171 Other acute osteomyelitis, right ankle and foot: Secondary | ICD-10-CM

## 2022-10-07 DIAGNOSIS — M86071 Acute hematogenous osteomyelitis, right ankle and foot: Secondary | ICD-10-CM

## 2022-10-07 NOTE — Progress Notes (Signed)
Patient ID: Wayne [name], male   DOB: 1941-03-20, 82 y.o.   MRN: [id number]  HPI  Finishing  6 wk of piptazo via picc line /right chest wall at [facility] center for right foot osteo sp TM amputaiton. Healing well.   Outpatient Encounter Medications as of 10/07/2022  Medication Sig   Accu-Chek Softclix Lancets lancets Use 4 times daily as directed to check blood sugars.   acetaminophen (TYLENOL) 325 MG tablet Take 2 tablets (650 mg total) by mouth every 6 (six) hours as needed for mild pain (or Fever >/= 101).   bisacodyl (DULCOLAX) 10 MG suppository Place 1 suppository (10 mg total) rectally daily as needed for moderate constipation.   cyanocobalamin (VITAMIN B12) 1000 MCG tablet Take 1,000 mcg by mouth daily.   dapagliflozin propanediol (FARXIGA) 10 MG TABS tablet Take 1 tablet (10 mg total) by mouth daily.   furosemide (LASIX) 40 MG tablet TAKE 1 TABLET BY MOUTH EVERY DAY   insulin glargine (LANTUS SOLOSTAR) 100 UNIT/ML Solostar Pen Inject 15 Units into the skin daily.   lisinopril (ZESTRIL) 5 MG tablet Take 1 tablet (5 mg total) by mouth daily.   metoprolol succinate (TOPROL-XL) 25 MG 24 hr tablet Take 0.5 tablets (12.5 mg total) by mouth 2 (two) times daily.   miconazole (MICOTIN) 2 % cream Apply 1 Application topically 2 (two) times daily.   Nutritional Supplements (ENSURE CLEAR PO) Take 1 Bottle by mouth 2 (two) times daily.   polyethylene glycol (MIRALAX / GLYCOLAX) 17 g packet Take 17 g by mouth daily as needed.   potassium chloride (KLOR-CON M) 10 MEQ tablet Take 1 tablet (10 mEq total) by mouth daily.   Protein (PROSOURCE PO) Take 30 mLs by mouth 3 (three) times daily.   senna-docusate (SENOKOT-S) 8.6-50 MG tablet Take 1 tablet by mouth 2 (two) times daily.   piperacillin-tazobactam (ZOSYN) IVPB Inject 3.375 g into the vein every 6 (six) hours. Indication:  R-foot osteo First Dose: Yes Last Day of Therapy:  10/07/22 Labs - Once weekly:  CBC/D and BMP, ESR and  CRP Method of administration: Intermittent infusions over 30 minutes Pull PICC line at the completion of IV antibiotics Method of administration may be changed at the discretion of home infusion pharmacist based upon assessment of the patient and/or caregiver's ability to self-administer the medication ordered. (Patient not taking: Reported on 10/07/2022)   sodium chloride irrigation 0.9 % irrigation Irrigate with 5 mLs as directed every 6 (six) hours. After IV ABO completion and then follow with the heparin flush (Patient not taking: Reported on 10/07/2022)   No facility-administered encounter medications on file as of 10/07/2022.     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) 09/02/2022   CKD stage 3 due to type 2 diabetes mellitus (HCC) 09/02/2022   Anemia in stage 3a chronic kidney disease (HCC) 09/02/2022   Aortic atherosclerosis (HCC) 09/02/2022   Protein-calorie malnutrition, severe 08/25/2022   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   Malnutrition of moderate degree 05/26/2022   AKI (acute kidney injury) (HCC) 05/21/2022   Pyogenic inflammation of bone (HCC) 05/21/2022   Acute osteomyelitis of right foot (HCC) 05/20/2022   Hypertension associated with diabetes (HCC) 02/16/2022   Hyperlipidemia associated with type 2 diabetes mellitus (HCC) 02/16/2022   Vitamin  D deficiency 02/16/2022   Primary insomnia 02/16/2022   Closed compression fracture of body of L1 vertebra (HCC) 12/03/2021   Chronic diastolic CHF (congestive heart failure) (HCC) 12/02/2021   Chronic kidney disease, stage 3a (HCC) 12/02/2021   Ischemia of toe 12/01/2021   Chronic constipation 11/30/2021   Fall at home 11/28/2021   Subdural hematoma (HCC) 11/28/2021   Failure to thrive in adult 11/28/2021    Vitamin B12 deficiency 11/28/2021   B12 deficiency anemia 11/28/2021   PAD (peripheral artery disease) (HCC) 11/28/2021   Diabetic neuropathy (HCC) 11/28/2021   Cellulitis of right foot 11/11/2021   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) 11/11/2021   CAD S/P percutaneous coronary angioplasty 11/11/2021   Cellulitis 11/11/2021     Health Maintenance Due  Topic Date Due   FOOT EXAM  Never done   OPHTHALMOLOGY EXAM  Never done   Diabetic kidney evaluation - Urine ACR  Never done   DTaP/Tdap/Td (1 - Tdap) Never done   Zoster Vaccines- Shingrix (1 of 2) Never done   Pneumonia Vaccine 39+ Years old (2 of 2 - PPSV23 or PCV20) 07/24/2015   COVID-19 Vaccine (1 - 2023-24 season) Never done     Review of Systems  Physical Exam   BP (!) 204/83   Pulse (!) 55   Temp 98 F (36.7 C) (Oral)   SpO2 98%   Gen = a x o by 3 in nad Chest wall = cvc is well dressed Ext: TM amputation site healing well   CBC Lab Results  Component Value Date   WBC 6.6 09/09/2022   RBC 4.10 (L) 09/09/2022   HGB 11.6 (L) 09/09/2022   HCT 36.1 (L) 09/09/2022   PLT 183 09/09/2022   MCV 88.0 09/09/2022   MCH 28.3 09/09/2022   MCHC 32.1 09/09/2022   RDW 16.1 (H) 09/09/2022   LYMPHSABS 1.8 09/09/2022   MONOABS 0.5 09/09/2022   EOSABS 0.8 (H) 09/09/2022    BMET Lab Results  Component Value Date   NA 139 10/04/2022   K 3.2 (L) 10/04/2022   CL 108 10/04/2022   CO2 23 10/04/2022   GLUCOSE 105 (H) 10/04/2022   BUN 21 10/04/2022   CREATININE 1.41 (H) 10/04/2022   CALCIUM 8.3 (L) 10/04/2022   GFRNONAA 49 (L) 10/04/2022   GFRAA >90 06/13/2012   Lab Results  Component Value Date   ESRSEDRATE 34 (H) 10/07/2022   Lab Results  Component Value Date   CRP 4.3 10/07/2022    Assessment and Plan  Will finish out Iv abtx today and have IR pull CVC. Site looks well healed. No need for furhter abtx  Rtc as needed

## 2022-10-08 ENCOUNTER — Other Ambulatory Visit (HOSPITAL_COMMUNITY): Payer: Self-pay | Admitting: Internal Medicine

## 2022-10-08 ENCOUNTER — Telehealth: Payer: Self-pay

## 2022-10-08 ENCOUNTER — Ambulatory Visit (HOSPITAL_COMMUNITY)
Admission: RE | Admit: 2022-10-08 | Discharge: 2022-10-08 | Disposition: A | Discharge status: Home / Self Care | Payer: No Typology Code available for payment source | Source: Ambulatory Visit | Attending: Internal Medicine | Admitting: Internal Medicine

## 2022-10-08 DIAGNOSIS — M86071 Acute hematogenous osteomyelitis, right ankle and foot: Secondary | ICD-10-CM | POA: Insufficient documentation

## 2022-10-08 DIAGNOSIS — Z452 Encounter for adjustment and management of vascular access device: Secondary | ICD-10-CM | POA: Diagnosis not present

## 2022-10-08 HISTORY — PX: IR REMOVAL TUN CV CATH W/O FL: IMG2289

## 2022-10-08 LAB — CBC WITH DIFFERENTIAL/PLATELET
Absolute Monocytes: 540 cells/uL (ref 200–950)
Basophils Absolute: 60 cells/uL (ref 0–200)
Basophils Relative: 1.2 %
Eosinophils Absolute: 610 cells/uL — ABNORMAL HIGH (ref 15–500)
Eosinophils Relative: 12.2 %
HCT: 41.9 % (ref 38.5–50.0)
Hemoglobin: 13.8 g/dL (ref 13.2–17.1)
Lymphs Abs: 1550 cells/uL (ref 850–3900)
MCH: 28.8 pg (ref 27.0–33.0)
MCHC: 32.9 g/dL (ref 32.0–36.0)
MCV: 87.5 fL (ref 80.0–100.0)
MPV: 11.6 fL (ref 7.5–12.5)
Monocytes Relative: 10.8 %
Neutro Abs: 2240 cells/uL (ref 1500–7800)
Neutrophils Relative %: 44.8 %
Platelets: 153 10*3/uL (ref 140–400)
RBC: 4.79 10*6/uL (ref 4.20–5.80)
RDW: 15.3 % — ABNORMAL HIGH (ref 11.0–15.0)
Total Lymphocyte: 31 %
WBC: 5 10*3/uL (ref 3.8–10.8)

## 2022-10-08 LAB — BASIC METABOLIC PANEL
BUN/Creatinine Ratio: 14 (calc) (ref 6–22)
BUN: 22 mg/dL (ref 7–25)
CO2: 28 mmol/L (ref 20–32)
Calcium: 9.3 mg/dL (ref 8.6–10.3)
Chloride: 102 mmol/L (ref 98–110)
Creat: 1.57 mg/dL — ABNORMAL HIGH (ref 0.70–1.22)
Glucose, Bld: 140 mg/dL — ABNORMAL HIGH (ref 65–99)
Potassium: 3.7 mmol/L (ref 3.5–5.3)
Sodium: 142 mmol/L (ref 135–146)

## 2022-10-08 LAB — C-REACTIVE PROTEIN: CRP: 4.3 mg/L (ref <8.0)

## 2022-10-08 LAB — SEDIMENTATION RATE: Sed Rate: 34 mm/h — ABNORMAL HIGH (ref 0–20)

## 2022-10-08 MED ORDER — LIDOCAINE HCL 1 % IJ SOLN
INTRAMUSCULAR | Status: AC
  Filled 2022-10-08: qty 20

## 2022-10-08 NOTE — Telephone Encounter (Signed)
Per Dr. Drue Second patient will need appointment with IR for CVC removal. Patient can either do Friday 7/12 or Monday 7/15. Patient scheduled with IR today at 11:15. Patient's family already aware of appointment. Wayne [name], RMA

## 2022-10-10 ENCOUNTER — Other Ambulatory Visit: Payer: Self-pay | Admitting: Family Medicine

## 2022-10-10 DIAGNOSIS — E1159 Type 2 diabetes mellitus with other circulatory complications: Secondary | ICD-10-CM

## 2022-10-10 DIAGNOSIS — I5043 Acute on chronic combined systolic (congestive) and diastolic (congestive) heart failure: Secondary | ICD-10-CM

## 2022-10-11 NOTE — Telephone Encounter (Signed)
Patient has been sent to [facility] to picc line pulled(07/12) since they have no way of removing  it there at the Center.

## 2022-10-12 ENCOUNTER — Encounter: Payer: Self-pay | Admitting: Family Medicine

## 2022-10-12 ENCOUNTER — Ambulatory Visit: Payer: Medicare Other | Admitting: Family Medicine

## 2022-10-12 VITALS — BP 150/62 | HR 62 | Temp 97.4°F | Resp 20 | Ht 72.0 in | Wt 173.2 lb

## 2022-10-12 DIAGNOSIS — N183 Chronic kidney disease, stage 3 unspecified: Secondary | ICD-10-CM

## 2022-10-12 DIAGNOSIS — E1169 Type 2 diabetes mellitus with other specified complication: Secondary | ICD-10-CM

## 2022-10-12 DIAGNOSIS — E785 Hyperlipidemia, unspecified: Secondary | ICD-10-CM

## 2022-10-12 DIAGNOSIS — E1122 Type 2 diabetes mellitus with diabetic chronic kidney disease: Secondary | ICD-10-CM | POA: Diagnosis not present

## 2022-10-12 DIAGNOSIS — I129 Hypertensive chronic kidney disease with stage 1 through stage 4 chronic kidney disease, or unspecified chronic kidney disease: Secondary | ICD-10-CM | POA: Diagnosis not present

## 2022-10-12 DIAGNOSIS — E1151 Type 2 diabetes mellitus with diabetic peripheral angiopathy without gangrene: Secondary | ICD-10-CM | POA: Diagnosis not present

## 2022-10-12 MED ORDER — LISINOPRIL 5 MG PO TABS
5.0000 mg | ORAL_TABLET | Freq: Every day | ORAL | 0 refills | Status: DC
Start: 2022-10-12 — End: 2023-06-09

## 2022-10-12 MED ORDER — FREESTYLE LIBRE 3 READER DEVI
1.0000 | Freq: Four times a day (QID) | 1 refills | Status: DC
Start: 2022-10-12 — End: 2022-12-10

## 2022-10-12 MED ORDER — FREESTYLE LIBRE 3 SENSOR MISC: 1.0000 | Freq: Four times a day (QID) | 11 refills | Status: DC

## 2022-10-12 NOTE — Patient Instructions (Signed)

## 2022-10-12 NOTE — Progress Notes (Signed)
Subjective:  Patient ID: Wayne [name], male    DOB: Mar 24, 1941, 82 y.o.   MRN: [id number]  Patient Care Team: Sonny Masters, FNP as PCP - General (Family Medicine) Christell Constant, MD as PCP - Cardiology (Cardiology) Vivi Barrack, DPM as Consulting Physician (Podiatry) Jodelle Gross, NP as Nurse Practitioner (Cardiology)   Chief Complaint:  Hypertension   HPI: Wayne [name] is a 82 y.o. male presenting on 10/12/2022 for Hypertension Pt HOH and not wearing hearing aids. Wife answered most questions for patient and is very involved in the patients daily care   1. 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 No medication side effects.  BP Readings from Last 3 Encounters:  10/12/22 (!) 150/62  10/07/22 (!) 204/83  10/06/22 111/69     2. Hyperlipidemia associated with type 2 diabetes mellitus (HCC) Compliant with medications - family and patient declines Current medications - family and patient decline statins Side effects from medications - denies  Diet - low sodium and low fat Exercise - limited    3. CKD stage 3 due to type 2 diabetes mellitus (HCC) Pt denies muscle weakness but has intermittent fatigue that wife says is from recent surgery and hospitalization. Pt does report poor night sleep (<3hrs) and frequent naps during th day Denies neuropathy or restless leg syndrome, Denies dry skin, pruritis. Denies anorexia or decreased appetite. Denies any nose bleeds or tendency to bleed.  Relevant past medical, surgical,  family, and social history reviewed and updated as indicated.  Allergies and medications reviewed and updated. Data reviewed: Chart in Epic.   Past Medical History:  Diagnosis Date   Anemia of chronic disease    Chronic HFrEF (heart failure with reduced ejection fraction) (HCC)    Chronic kidney disease, stage 3a (HCC)    Coronary artery disease    Dr. Deborah Chalk years ago. No details.   Diabetes mellitus with circulatory complication (HCC)    Diabetic foot infection (HCC)    Encephalopathy    Former tobacco use    Lactic acidosis 11/28/2021   Multifocal pneumonia 11/28/2021   PAD (peripheral artery disease) (HCC)    Peripheral neuropathy    SDH (subdural hematoma) (HCC)    Severe pulmonary hypertension (HCC)     Past Surgical History:  Procedure Laterality Date   ABDOMINAL AORTOGRAM W/LOWER EXTREMITY Right 11/13/2021   Procedure: ABDOMINAL AORTOGRAM W/LOWER EXTREMITY;  Surgeon: Leonie Douglas, MD;  Location: [facility];  Service: Cardiovascular;  Laterality: Right;   AMPUTATION Right 05/23/2022   Procedure: AMPUTATION RAY;  Surgeon: Pilar Plate, DPM;  Location: [facility];  Service: Podiatry;  Laterality: Right;  Partial 5th ray amputation   AMPUTATION Right 08/26/2022   Procedure: MIDFOOT AMPUTATION RIGHT FOOT, BONE BIOPSY RIGHT FOOT, TENDO ACHILIES LENGTHENING;  Surgeon: Felecia Shelling, DPM;  Location: [facility];  Service: Podiatry;  Laterality: Right;   APPLICATION OF WOUND VAC Right 08/26/2022   Procedure: APPLICATION OF WOUND VAC;  Surgeon: Felecia Shelling, DPM;  Location: [facility];  Service: Podiatry;  Laterality: Right;   CORONARY ANGIOPLASTY WITH STENT PLACEMENT     IR FLUORO GUIDE CV LINE RIGHT  08/27/2022   IR REMOVAL TUN CV CATH W/O FL  10/08/2022   IR US GUIDE VASC ACCESS RIGHT  08/27/2022   IRRIGATION AND DEBRIDEMENT FOOT Right 05/25/2022   Procedure: IRRIGATION AND DEBRIDEMENT FOOT;  Surgeon: Vivi Barrack, DPM;  Location: [facility];  Service: Podiatry;   Laterality: Right;    Social History   Socioeconomic History   Marital status: Divorced    Spouse name: Not on file   Number of children: Not on file   Years of education: Not on file   Highest education level: Not on file  Occupational History   Not on file  Tobacco Use   Smoking status: Former    Current packs/day: 0.00    Types: Cigarettes    Quit date: 65    Years since quitting: 31.5   Smokeless tobacco: Never  Vaping Use   Vaping status: Never Used  Substance and Sexual Activity   Alcohol use: No   Drug use: No   Sexual activity: Not Currently  Other Topics Concern   Not on file  Social History Narrative   Not on file   Social Determinants of Health   Financial Resource Strain: Low Risk  (05/05/2022)   Overall Financial Resource Strain (CARDIA)    Difficulty of Paying Living Expenses: Not hard at all  Food Insecurity: No Food Insecurity (05/22/2022)   Hunger Vital Sign    Worried About Running Out of Food in the Last Year: Never true    Ran Out of Food in the Last Year: Never true  Transportation Needs: No Transportation Needs (05/22/2022)   PRAPARE - Administrator, Civil Service (Medical): No    Lack of Transportation (Non-Medical): No  Physical Activity: Inactive (05/05/2022)   Exercise Vital Sign    Days of Exercise per Week: 0 days    Minutes of Exercise per Session: 0 min  Stress: No Stress Concern Present (05/05/2022)   [employer] of Occupational Health - Occupational Stress Questionnaire    Feeling of Stress : Not at all  Social Connections: Socially Isolated (05/05/2022)   Social Connection and Isolation Panel [NHANES]    Frequency of Communication with Friends and Family: More than three times a week    Frequency of Social Gatherings with Friends and Family: More than three times a week    Attends Religious Services: Never    Database administrator or Organizations: No    Attends Banker Meetings: Never    Marital  Status: Divorced  Catering manager Violence: Not At Risk (05/22/2022)   Humiliation, Afraid, Rape, and Kick questionnaire    Fear of Current or Ex-Partner: No    Emotionally Abused: No    Physically Abused: No    Sexually Abused: No    Outpatient Encounter Medications as of 10/12/2022  Medication Sig   Accu-Chek Softclix Lancets lancets Use 4 times daily as directed to check blood sugars.   Continuous Glucose Receiver (FREESTYLE LIBRE 3 READER) DEVI 1 Device by Does not apply route in the morning, at noon, in the evening, and at bedtime.   Continuous Glucose Sensor (FREESTYLE LIBRE 3 SENSOR) MISC 1 Device by Does not apply route 4 (four) times daily. Place 1 sensor on the skin every 14 days. Use to check glucose continuously   dapagliflozin propanediol (FARXIGA) 10 MG TABS tablet Take 1 tablet (10  mg total) by mouth daily.   furosemide (LASIX) 40 MG tablet TAKE 1 TABLET BY MOUTH EVERY DAY   glipiZIDE (GLUCOTROL) 5 MG tablet Take 5 mg by mouth daily before breakfast.   metoprolol succinate (TOPROL-XL) 25 MG 24 hr tablet TAKE 1/2 TABLET BY MOUTH DAILY (Patient taking differently: Take 12.5 mg by mouth in the morning and at bedtime.)   lisinopril (ZESTRIL) 5 MG tablet Take 1 tablet (5 mg total) by mouth daily.   [DISCONTINUED] acetaminophen (TYLENOL) 325 MG tablet Take 2 tablets (650 mg total) by mouth every 6 (six) hours as needed for mild pain (or Fever >/= 101). (Patient not taking: Reported on 10/12/2022)   [DISCONTINUED] bisacodyl (DULCOLAX) 10 MG suppository Place 1 suppository (10 mg total) rectally daily as needed for moderate constipation. (Patient not taking: Reported on 10/12/2022)   [DISCONTINUED] cyanocobalamin (VITAMIN B12) 1000 MCG tablet Take 1,000 mcg by mouth daily. (Patient not taking: Reported on 10/12/2022)   [DISCONTINUED] insulin glargine (LANTUS SOLOSTAR) 100 UNIT/ML Solostar Pen Inject 15 Units into the skin daily. (Patient not taking: Reported on 10/12/2022)   [DISCONTINUED]  lisinopril (ZESTRIL) 5 MG tablet Take 1 tablet (5 mg total) by mouth daily. (Patient not taking: Reported on 10/12/2022)   [DISCONTINUED] miconazole (MICOTIN) 2 % cream Apply 1 Application topically 2 (two) times daily. (Patient not taking: Reported on 10/12/2022)   [DISCONTINUED] Nutritional Supplements (ENSURE CLEAR PO) Take 1 Bottle by mouth 2 (two) times daily. (Patient not taking: Reported on 10/12/2022)   [DISCONTINUED] polyethylene glycol (MIRALAX / GLYCOLAX) 17 g packet Take 17 g by mouth daily as needed. (Patient not taking: Reported on 10/12/2022)   [DISCONTINUED] potassium chloride (KLOR-CON M) 10 MEQ tablet Take 1 tablet (10 mEq total) by mouth daily. (Patient not taking: Reported on 10/12/2022)   [DISCONTINUED] Protein (PROSOURCE PO) Take 30 mLs by mouth 3 (three) times daily. (Patient not taking: Reported on 10/12/2022)   [DISCONTINUED] senna-docusate (SENOKOT-S) 8.6-50 MG tablet Take 1 tablet by mouth 2 (two) times daily. (Patient not taking: Reported on 10/12/2022)   [DISCONTINUED] sodium chloride irrigation 0.9 % irrigation Irrigate with 5 mLs as directed every 6 (six) hours. After IV ABO completion and then follow with the heparin flush (Patient not taking: Reported on 10/07/2022)   No facility-administered encounter medications on file as of 10/12/2022.    No Known Allergies  Review of Systems  Constitutional:  Positive for activity change (waxes and wanes) and fatigue (waxes and wanes).  HENT:         HOH  Eyes: Negative.   Respiratory: Negative.    Cardiovascular: Negative.   Gastrointestinal: Negative.   Endocrine: Negative.   Genitourinary: Negative.   Musculoskeletal: Negative.   Skin:  Positive for wound (left foot partial amputation).  Allergic/Immunologic: Negative.   Neurological:  Positive for dizziness and weakness (s/p partial amputation of left foot). Negative for light-headedness, numbness and headaches.  Psychiatric/Behavioral:  Positive for sleep disturbance  (family states he is up and down at HS and napps during day). Negative for self-injury.   All other systems reviewed and are negative.       Objective:  BP (!) 150/62   Pulse 62   Temp (!) 97.4 F (36.3 C) (Oral)   Resp 20   Ht 6' (1.829 m)   Wt 173 lb 3 oz (78.6 kg)   SpO2 98%   BMI 23.49 kg/m    Wt Readings from Last 3 Encounters:  10/12/22 173 lb 3 oz (78.6 kg)  10/06/22  181 lb (82.1 kg)  09/28/22 181 lb (82.1 kg)    Physical Exam Vitals and nursing note reviewed.  Constitutional:      Appearance: Normal appearance. He is normal weight.  HENT:     Head: Normocephalic and atraumatic.     Comments: Not wearing his hearing aids today    Right Ear: Tympanic membrane, ear canal and external ear normal.     Left Ear: Tympanic membrane, ear canal and external ear normal.     Nose: Nose normal.     Mouth/Throat:     Mouth: Mucous membranes are moist.  Eyes:     Extraocular Movements: Extraocular movements intact.     Pupils: Pupils are equal, round, and reactive to light.  Cardiovascular:     Rate and Rhythm: Normal rate and regular rhythm.     Pulses: Normal pulses.  Pulmonary:     Effort: Pulmonary effort is normal.     Breath sounds: Normal breath sounds.  Abdominal:     General: Abdomen is flat. Bowel sounds are normal.  Musculoskeletal:        General: Normal range of motion.     Cervical back: Normal range of motion and neck supple.     Right lower leg: No edema.     Left lower leg: No edema.  Skin:    General: Skin is warm.     Capillary Refill: Capillary refill takes less than 2 seconds.  Neurological:     General: No focal deficit present.     Mental Status: He is alert.     Gait: Gait abnormal (using walker).  Psychiatric:        Mood and Affect: Mood normal.        Behavior: Behavior normal.        Thought Content: Thought content normal.        Judgment: Judgment normal.     Results for orders placed or performed in visit on 10/07/22  Basic  metabolic panel  Result Value Ref Range   Glucose, Bld 140 (H) 65 - 99 mg/dL   BUN 22 7 - 25 mg/dL   Creat 7.25 (H) 3.66 - 1.22 mg/dL   BUN/Creatinine Ratio 14 6 - 22 (calc)   Sodium 142 135 - 146 mmol/L   Potassium 3.7 3.5 - 5.3 mmol/L   Chloride 102 98 - 110 mmol/L   CO2 28 20 - 32 mmol/L   Calcium 9.3 8.6 - 10.3 mg/dL  CBC with Differential/Platelet  Result Value Ref Range   WBC 5.0 3.8 - 10.8 Thousand/uL   RBC 4.79 4.20 - 5.80 Million/uL   Hemoglobin 13.8 13.2 - 17.1 g/dL   HCT 44.0 34.7 - 42.5 %   MCV 87.5 80.0 - 100.0 fL   MCH 28.8 27.0 - 33.0 pg   MCHC 32.9 32.0 - 36.0 g/dL   RDW 95.6 (H) 38.7 - 56.4 %   Platelets 153 140 - 400 Thousand/uL   MPV 11.6 7.5 - 12.5 fL   Neutro Abs 2,240 1,500 - 7,800 cells/uL   Lymphs Abs 1,550 850 - 3,900 cells/uL   Absolute Monocytes 540 200 - 950 cells/uL   Eosinophils Absolute 610 (H) 15 - 500 cells/uL   Basophils Absolute 60 0 - 200 cells/uL   Neutrophils Relative % 44.8 %   Total Lymphocyte 31.0 %   Monocytes Relative 10.8 %   Eosinophils Relative 12.2 %   Basophils Relative 1.2 %  C-reactive protein  Result Value Ref Range  CRP 4.3 <8.0 mg/L  Sedimentation rate  Result Value Ref Range   Sed Rate 34 (H) 0 - 20 mm/h       Pertinent labs & imaging results that were available during my care of the patient were reviewed by me and considered in my medical decision making.  Assessment & Plan:  Aiman "Al" was seen today for hypertension.  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 Changes were made in regimen today. Goal BP is 150/80. Pt aware to report any persistent hightoday or low readings. DASH diet and exercise encouraged. Exercise at least 150 minutes per week and increase as tolerated. Goal BMI > 25. Stress management encouraged. Avoid nicotine and tobacco product use. Avoid excessive alcohol and NSAID's. Avoid more than  2000 mg of sodium daily. Medications as prescribed. Follow up as scheduled.    Hyperlipidemia associated with type 2 diabetes mellitus (HCC) Diet encouraged - increase intake of fresh fruits and vegetables, increase intake of lean proteins. Bake, broil, or grill foods. Avoid fried, greasy, and fatty foods. Avoid fast foods. Increase intake of fiber-rich whole grains. Exercise encouraged - at least 150 minutes per week and advance as tolerated.  Goal BMI < 25. Continue medications as prescribed. Follow up in 3-6 months as discussed.    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)  Rx changes:  Freestyle Libre Education: Reviewed 'ABCs' of diabetes management (respective goals in parentheses):  A1C (<7), blood pressure (<150/80), BMI (<25), and cholesterol (LDL <100). Discussed pathophysiology of DM type 2 Recommend check BG 7 times a week or continuous with frestyyle libre and record readings to bring to next appointment. Report persistent high or low readings. Recommended increase physical activity - goal is 150 minutes per week and advance as tolerated.  Continue all other maintenance medications.  Follow up plan: Return to clinic 21 October 2022 to check kidney function, blood sugar and HTN.  Continue healthy lifestyle choices, including diet (rich in fruits, vegetables, and lean proteins, and low in salt and simple carbohydrates) and exercise (at least 30 minutes of moderate physical activity daily).  Educational handout given for dash diet and HTN   The above assessment and management plan was discussed with the patient. The patient verbalized understanding of and has agreed to the management plan. Patient is aware to call the clinic if they develop any new symptoms or if symptoms persist or worsen. Patient is aware when to return to the clinic for a follow-up visit. Patient educated on when it is appropriate to go to the emergency department.   Maryelizabeth Kaufmann NP  student [facility] [phone]   I personally was present during the history, physical exam, and medical decision-making activities of this visit and have verified that the services and findings are accurately documented in the nurse practitioner student's note.  Kari Baars, FNP-C [facility] [address] [place], [place] [address] [phone]

## 2022-10-13 ENCOUNTER — Ambulatory Visit: Payer: Medicare Other | Admitting: Podiatry

## 2022-10-14 ENCOUNTER — Telehealth: Payer: Self-pay | Admitting: Family Medicine

## 2022-10-14 DIAGNOSIS — E1122 Type 2 diabetes mellitus with diabetic chronic kidney disease: Secondary | ICD-10-CM | POA: Diagnosis not present

## 2022-10-14 DIAGNOSIS — I130 Hypertensive heart and chronic kidney disease with heart failure and stage 1 through stage 4 chronic kidney disease, or unspecified chronic kidney disease: Secondary | ICD-10-CM | POA: Diagnosis not present

## 2022-10-14 DIAGNOSIS — I5042 Chronic combined systolic (congestive) and diastolic (congestive) heart failure: Secondary | ICD-10-CM | POA: Diagnosis not present

## 2022-10-14 DIAGNOSIS — E11621 Type 2 diabetes mellitus with foot ulcer: Secondary | ICD-10-CM | POA: Diagnosis not present

## 2022-10-14 DIAGNOSIS — L97521 Non-pressure chronic ulcer of other part of left foot limited to breakdown of skin: Secondary | ICD-10-CM | POA: Diagnosis not present

## 2022-10-15 NOTE — Telephone Encounter (Signed)
Left detailed message on Aprils voicemail with providers response. Advised to contact the office and keep [place] updated on ulcer.

## 2022-10-18 DIAGNOSIS — E1122 Type 2 diabetes mellitus with diabetic chronic kidney disease: Secondary | ICD-10-CM | POA: Diagnosis not present

## 2022-10-18 DIAGNOSIS — I130 Hypertensive heart and chronic kidney disease with heart failure and stage 1 through stage 4 chronic kidney disease, or unspecified chronic kidney disease: Secondary | ICD-10-CM | POA: Diagnosis not present

## 2022-10-18 DIAGNOSIS — L97521 Non-pressure chronic ulcer of other part of left foot limited to breakdown of skin: Secondary | ICD-10-CM | POA: Diagnosis not present

## 2022-10-18 DIAGNOSIS — E11621 Type 2 diabetes mellitus with foot ulcer: Secondary | ICD-10-CM | POA: Diagnosis not present

## 2022-10-18 DIAGNOSIS — I5042 Chronic combined systolic (congestive) and diastolic (congestive) heart failure: Secondary | ICD-10-CM | POA: Diagnosis not present

## 2022-10-19 DIAGNOSIS — L97521 Non-pressure chronic ulcer of other part of left foot limited to breakdown of skin: Secondary | ICD-10-CM | POA: Diagnosis not present

## 2022-10-19 DIAGNOSIS — E1122 Type 2 diabetes mellitus with diabetic chronic kidney disease: Secondary | ICD-10-CM | POA: Diagnosis not present

## 2022-10-19 DIAGNOSIS — E11621 Type 2 diabetes mellitus with foot ulcer: Secondary | ICD-10-CM | POA: Diagnosis not present

## 2022-10-19 DIAGNOSIS — I5042 Chronic combined systolic (congestive) and diastolic (congestive) heart failure: Secondary | ICD-10-CM | POA: Diagnosis not present

## 2022-10-19 DIAGNOSIS — I130 Hypertensive heart and chronic kidney disease with heart failure and stage 1 through stage 4 chronic kidney disease, or unspecified chronic kidney disease: Secondary | ICD-10-CM | POA: Diagnosis not present

## 2022-10-20 ENCOUNTER — Ambulatory Visit: Payer: Medicare Other | Admitting: Family Medicine

## 2022-10-20 DIAGNOSIS — L97521 Non-pressure chronic ulcer of other part of left foot limited to breakdown of skin: Secondary | ICD-10-CM | POA: Diagnosis not present

## 2022-10-20 DIAGNOSIS — I5042 Chronic combined systolic (congestive) and diastolic (congestive) heart failure: Secondary | ICD-10-CM | POA: Diagnosis not present

## 2022-10-20 DIAGNOSIS — I130 Hypertensive heart and chronic kidney disease with heart failure and stage 1 through stage 4 chronic kidney disease, or unspecified chronic kidney disease: Secondary | ICD-10-CM | POA: Diagnosis not present

## 2022-10-20 DIAGNOSIS — E1122 Type 2 diabetes mellitus with diabetic chronic kidney disease: Secondary | ICD-10-CM | POA: Diagnosis not present

## 2022-10-20 DIAGNOSIS — E11621 Type 2 diabetes mellitus with foot ulcer: Secondary | ICD-10-CM | POA: Diagnosis not present

## 2022-10-21 DIAGNOSIS — E11621 Type 2 diabetes mellitus with foot ulcer: Secondary | ICD-10-CM | POA: Diagnosis not present

## 2022-10-21 DIAGNOSIS — I130 Hypertensive heart and chronic kidney disease with heart failure and stage 1 through stage 4 chronic kidney disease, or unspecified chronic kidney disease: Secondary | ICD-10-CM | POA: Diagnosis not present

## 2022-10-21 DIAGNOSIS — E1122 Type 2 diabetes mellitus with diabetic chronic kidney disease: Secondary | ICD-10-CM | POA: Diagnosis not present

## 2022-10-21 DIAGNOSIS — L97521 Non-pressure chronic ulcer of other part of left foot limited to breakdown of skin: Secondary | ICD-10-CM | POA: Diagnosis not present

## 2022-10-21 DIAGNOSIS — I5042 Chronic combined systolic (congestive) and diastolic (congestive) heart failure: Secondary | ICD-10-CM | POA: Diagnosis not present

## 2022-10-25 DIAGNOSIS — N1831 Chronic kidney disease, stage 3a: Secondary | ICD-10-CM | POA: Diagnosis not present

## 2022-10-25 DIAGNOSIS — E1159 Type 2 diabetes mellitus with other circulatory complications: Secondary | ICD-10-CM | POA: Diagnosis not present

## 2022-10-25 DIAGNOSIS — M869 Osteomyelitis, unspecified: Secondary | ICD-10-CM | POA: Diagnosis not present

## 2022-10-25 DIAGNOSIS — E440 Moderate protein-calorie malnutrition: Secondary | ICD-10-CM | POA: Diagnosis not present

## 2022-10-26 DIAGNOSIS — E11621 Type 2 diabetes mellitus with foot ulcer: Secondary | ICD-10-CM | POA: Diagnosis not present

## 2022-10-26 DIAGNOSIS — I5042 Chronic combined systolic (congestive) and diastolic (congestive) heart failure: Secondary | ICD-10-CM | POA: Diagnosis not present

## 2022-10-26 DIAGNOSIS — L97521 Non-pressure chronic ulcer of other part of left foot limited to breakdown of skin: Secondary | ICD-10-CM | POA: Diagnosis not present

## 2022-10-26 DIAGNOSIS — E1122 Type 2 diabetes mellitus with diabetic chronic kidney disease: Secondary | ICD-10-CM | POA: Diagnosis not present

## 2022-10-26 DIAGNOSIS — I130 Hypertensive heart and chronic kidney disease with heart failure and stage 1 through stage 4 chronic kidney disease, or unspecified chronic kidney disease: Secondary | ICD-10-CM | POA: Diagnosis not present

## 2022-10-27 DIAGNOSIS — L97521 Non-pressure chronic ulcer of other part of left foot limited to breakdown of skin: Secondary | ICD-10-CM | POA: Diagnosis not present

## 2022-10-27 DIAGNOSIS — I5042 Chronic combined systolic (congestive) and diastolic (congestive) heart failure: Secondary | ICD-10-CM | POA: Diagnosis not present

## 2022-10-27 DIAGNOSIS — I130 Hypertensive heart and chronic kidney disease with heart failure and stage 1 through stage 4 chronic kidney disease, or unspecified chronic kidney disease: Secondary | ICD-10-CM | POA: Diagnosis not present

## 2022-10-27 DIAGNOSIS — E1122 Type 2 diabetes mellitus with diabetic chronic kidney disease: Secondary | ICD-10-CM | POA: Diagnosis not present

## 2022-10-27 DIAGNOSIS — E11621 Type 2 diabetes mellitus with foot ulcer: Secondary | ICD-10-CM | POA: Diagnosis not present

## 2022-10-29 ENCOUNTER — Ambulatory Visit (INDEPENDENT_AMBULATORY_CARE_PROVIDER_SITE_OTHER): Payer: Medicare Other

## 2022-10-29 DIAGNOSIS — D631 Anemia in chronic kidney disease: Secondary | ICD-10-CM

## 2022-10-29 DIAGNOSIS — I70201 Unspecified atherosclerosis of native arteries of extremities, right leg: Secondary | ICD-10-CM | POA: Diagnosis not present

## 2022-10-29 DIAGNOSIS — N1831 Chronic kidney disease, stage 3a: Secondary | ICD-10-CM | POA: Diagnosis not present

## 2022-10-29 DIAGNOSIS — E1142 Type 2 diabetes mellitus with diabetic polyneuropathy: Secondary | ICD-10-CM | POA: Diagnosis not present

## 2022-10-29 DIAGNOSIS — E1151 Type 2 diabetes mellitus with diabetic peripheral angiopathy without gangrene: Secondary | ICD-10-CM

## 2022-10-29 DIAGNOSIS — I5042 Chronic combined systolic (congestive) and diastolic (congestive) heart failure: Secondary | ICD-10-CM

## 2022-10-29 DIAGNOSIS — E1122 Type 2 diabetes mellitus with diabetic chronic kidney disease: Secondary | ICD-10-CM

## 2022-10-29 DIAGNOSIS — I130 Hypertensive heart and chronic kidney disease with heart failure and stage 1 through stage 4 chronic kidney disease, or unspecified chronic kidney disease: Secondary | ICD-10-CM

## 2022-10-29 DIAGNOSIS — E11621 Type 2 diabetes mellitus with foot ulcer: Secondary | ICD-10-CM | POA: Diagnosis not present

## 2022-10-29 DIAGNOSIS — L97521 Non-pressure chronic ulcer of other part of left foot limited to breakdown of skin: Secondary | ICD-10-CM | POA: Diagnosis not present

## 2022-11-01 DIAGNOSIS — E1122 Type 2 diabetes mellitus with diabetic chronic kidney disease: Secondary | ICD-10-CM | POA: Diagnosis not present

## 2022-11-01 DIAGNOSIS — E11621 Type 2 diabetes mellitus with foot ulcer: Secondary | ICD-10-CM | POA: Diagnosis not present

## 2022-11-01 DIAGNOSIS — I5042 Chronic combined systolic (congestive) and diastolic (congestive) heart failure: Secondary | ICD-10-CM | POA: Diagnosis not present

## 2022-11-01 DIAGNOSIS — I130 Hypertensive heart and chronic kidney disease with heart failure and stage 1 through stage 4 chronic kidney disease, or unspecified chronic kidney disease: Secondary | ICD-10-CM | POA: Diagnosis not present

## 2022-11-01 DIAGNOSIS — L97521 Non-pressure chronic ulcer of other part of left foot limited to breakdown of skin: Secondary | ICD-10-CM | POA: Diagnosis not present

## 2022-11-02 DIAGNOSIS — E11621 Type 2 diabetes mellitus with foot ulcer: Secondary | ICD-10-CM | POA: Diagnosis not present

## 2022-11-02 DIAGNOSIS — E1122 Type 2 diabetes mellitus with diabetic chronic kidney disease: Secondary | ICD-10-CM | POA: Diagnosis not present

## 2022-11-02 DIAGNOSIS — I5042 Chronic combined systolic (congestive) and diastolic (congestive) heart failure: Secondary | ICD-10-CM | POA: Diagnosis not present

## 2022-11-02 DIAGNOSIS — L97521 Non-pressure chronic ulcer of other part of left foot limited to breakdown of skin: Secondary | ICD-10-CM | POA: Diagnosis not present

## 2022-11-02 DIAGNOSIS — I130 Hypertensive heart and chronic kidney disease with heart failure and stage 1 through stage 4 chronic kidney disease, or unspecified chronic kidney disease: Secondary | ICD-10-CM | POA: Diagnosis not present

## 2022-11-03 ENCOUNTER — Encounter: Payer: Self-pay | Admitting: *Deleted

## 2022-11-05 DIAGNOSIS — I5042 Chronic combined systolic (congestive) and diastolic (congestive) heart failure: Secondary | ICD-10-CM | POA: Diagnosis not present

## 2022-11-05 DIAGNOSIS — I130 Hypertensive heart and chronic kidney disease with heart failure and stage 1 through stage 4 chronic kidney disease, or unspecified chronic kidney disease: Secondary | ICD-10-CM | POA: Diagnosis not present

## 2022-11-05 DIAGNOSIS — E1122 Type 2 diabetes mellitus with diabetic chronic kidney disease: Secondary | ICD-10-CM | POA: Diagnosis not present

## 2022-11-05 DIAGNOSIS — L97521 Non-pressure chronic ulcer of other part of left foot limited to breakdown of skin: Secondary | ICD-10-CM | POA: Diagnosis not present

## 2022-11-05 DIAGNOSIS — E11621 Type 2 diabetes mellitus with foot ulcer: Secondary | ICD-10-CM | POA: Diagnosis not present

## 2022-11-06 ENCOUNTER — Other Ambulatory Visit: Payer: Self-pay | Admitting: Adult Health

## 2022-11-06 DIAGNOSIS — I5043 Acute on chronic combined systolic (congestive) and diastolic (congestive) heart failure: Secondary | ICD-10-CM

## 2022-11-06 DIAGNOSIS — E1159 Type 2 diabetes mellitus with other circulatory complications: Secondary | ICD-10-CM

## 2022-11-09 DIAGNOSIS — E11621 Type 2 diabetes mellitus with foot ulcer: Secondary | ICD-10-CM | POA: Diagnosis not present

## 2022-11-09 DIAGNOSIS — I130 Hypertensive heart and chronic kidney disease with heart failure and stage 1 through stage 4 chronic kidney disease, or unspecified chronic kidney disease: Secondary | ICD-10-CM | POA: Diagnosis not present

## 2022-11-09 DIAGNOSIS — L97521 Non-pressure chronic ulcer of other part of left foot limited to breakdown of skin: Secondary | ICD-10-CM | POA: Diagnosis not present

## 2022-11-09 DIAGNOSIS — I5042 Chronic combined systolic (congestive) and diastolic (congestive) heart failure: Secondary | ICD-10-CM | POA: Diagnosis not present

## 2022-11-09 DIAGNOSIS — E1122 Type 2 diabetes mellitus with diabetic chronic kidney disease: Secondary | ICD-10-CM | POA: Diagnosis not present

## 2022-11-18 DIAGNOSIS — E1122 Type 2 diabetes mellitus with diabetic chronic kidney disease: Secondary | ICD-10-CM | POA: Diagnosis not present

## 2022-11-18 DIAGNOSIS — I5042 Chronic combined systolic (congestive) and diastolic (congestive) heart failure: Secondary | ICD-10-CM | POA: Diagnosis not present

## 2022-11-18 DIAGNOSIS — L97521 Non-pressure chronic ulcer of other part of left foot limited to breakdown of skin: Secondary | ICD-10-CM | POA: Diagnosis not present

## 2022-11-18 DIAGNOSIS — E11621 Type 2 diabetes mellitus with foot ulcer: Secondary | ICD-10-CM | POA: Diagnosis not present

## 2022-11-18 DIAGNOSIS — I130 Hypertensive heart and chronic kidney disease with heart failure and stage 1 through stage 4 chronic kidney disease, or unspecified chronic kidney disease: Secondary | ICD-10-CM | POA: Diagnosis not present

## 2022-11-22 DIAGNOSIS — E11621 Type 2 diabetes mellitus with foot ulcer: Secondary | ICD-10-CM | POA: Diagnosis not present

## 2022-11-22 DIAGNOSIS — L97521 Non-pressure chronic ulcer of other part of left foot limited to breakdown of skin: Secondary | ICD-10-CM | POA: Diagnosis not present

## 2022-11-22 DIAGNOSIS — E1122 Type 2 diabetes mellitus with diabetic chronic kidney disease: Secondary | ICD-10-CM | POA: Diagnosis not present

## 2022-11-22 DIAGNOSIS — I130 Hypertensive heart and chronic kidney disease with heart failure and stage 1 through stage 4 chronic kidney disease, or unspecified chronic kidney disease: Secondary | ICD-10-CM | POA: Diagnosis not present

## 2022-11-22 DIAGNOSIS — I5042 Chronic combined systolic (congestive) and diastolic (congestive) heart failure: Secondary | ICD-10-CM | POA: Diagnosis not present

## 2022-11-23 ENCOUNTER — Other Ambulatory Visit: Payer: Self-pay | Admitting: Family Medicine

## 2022-11-23 DIAGNOSIS — E1151 Type 2 diabetes mellitus with diabetic peripheral angiopathy without gangrene: Secondary | ICD-10-CM

## 2022-11-25 DIAGNOSIS — E440 Moderate protein-calorie malnutrition: Secondary | ICD-10-CM | POA: Diagnosis not present

## 2022-11-25 DIAGNOSIS — N1831 Chronic kidney disease, stage 3a: Secondary | ICD-10-CM | POA: Diagnosis not present

## 2022-11-25 DIAGNOSIS — E1159 Type 2 diabetes mellitus with other circulatory complications: Secondary | ICD-10-CM | POA: Diagnosis not present

## 2022-11-25 DIAGNOSIS — M869 Osteomyelitis, unspecified: Secondary | ICD-10-CM | POA: Diagnosis not present

## 2022-12-08 DIAGNOSIS — H524 Presbyopia: Secondary | ICD-10-CM | POA: Diagnosis not present

## 2022-12-08 DIAGNOSIS — H353131 Nonexudative age-related macular degeneration, bilateral, early dry stage: Secondary | ICD-10-CM | POA: Diagnosis not present

## 2022-12-08 DIAGNOSIS — H52203 Unspecified astigmatism, bilateral: Secondary | ICD-10-CM | POA: Diagnosis not present

## 2022-12-08 DIAGNOSIS — H25013 Cortical age-related cataract, bilateral: Secondary | ICD-10-CM | POA: Diagnosis not present

## 2022-12-08 DIAGNOSIS — H02403 Unspecified ptosis of bilateral eyelids: Secondary | ICD-10-CM | POA: Diagnosis not present

## 2022-12-08 DIAGNOSIS — E113293 Type 2 diabetes mellitus with mild nonproliferative diabetic retinopathy without macular edema, bilateral: Secondary | ICD-10-CM | POA: Diagnosis not present

## 2022-12-10 ENCOUNTER — Ambulatory Visit (INDEPENDENT_AMBULATORY_CARE_PROVIDER_SITE_OTHER): Payer: Medicare Other | Admitting: Family Medicine

## 2022-12-10 ENCOUNTER — Encounter: Payer: Self-pay | Admitting: Family Medicine

## 2022-12-10 VITALS — BP 204/72 | HR 67 | Temp 96.8°F | Ht 73.0 in | Wt 183.2 lb

## 2022-12-10 DIAGNOSIS — E1169 Type 2 diabetes mellitus with other specified complication: Secondary | ICD-10-CM | POA: Diagnosis not present

## 2022-12-10 DIAGNOSIS — E1159 Type 2 diabetes mellitus with other circulatory complications: Secondary | ICD-10-CM

## 2022-12-10 DIAGNOSIS — B372 Candidiasis of skin and nail: Secondary | ICD-10-CM

## 2022-12-10 DIAGNOSIS — I152 Hypertension secondary to endocrine disorders: Secondary | ICD-10-CM | POA: Diagnosis not present

## 2022-12-10 MED ORDER — FLUCONAZOLE 150 MG PO TABS
150.0000 mg | ORAL_TABLET | Freq: Once | ORAL | 0 refills | Status: AC
Start: 2022-12-10 — End: 2022-12-10

## 2022-12-10 MED ORDER — NYSTATIN 100000 UNIT/GM EX CREA: 1.0000 | TOPICAL_CREAM | Freq: Two times a day (BID) | CUTANEOUS | 0 refills | Status: DC

## 2022-12-10 NOTE — Progress Notes (Signed)
Subjective:  Patient ID: Wayne [name], male    DOB: [birth year], 82 y.o.   MRN: [id number]  Patient Care Team: Sonny Masters, FNP as PCP - General (Family Medicine) Christell Constant, MD as PCP - Cardiology (Cardiology) Vivi Barrack, DPM as Consulting Physician (Podiatry) Jodelle Gross, NP as Nurse Practitioner (Cardiology)   Chief Complaint:  Medical Management of Chronic Issues and Rash (X 3 weeks- groin area )   HPI: Wayne [name] is a 82 y.o. male presenting on 12/10/2022 for Medical Management of Chronic Issues and Rash (X 3 weeks- groin area )   Patient presents today with his daughter for management of diabetes with associated hypertension.  Patient daughter states blood pressure has been running low so she has not been getting his blood pressure medications as prescribed.  Only gives on as needed basis.  Patient has not had any medications today.  Daughter has a log of blood pressure and blood sugars at home.  Highest blood pressure reading from home was 196/79 and lowest reading was 88/39.  Daughter states patient has never symptomatic during these episodes.  Has not brought machine in for comparison/calibration.  Also reports a rash to his groin area for the last several days.  This is red and pruritic.  Has been trying some topicals without relief of symptoms.    Relevant past medical, surgical, family, and social history reviewed and updated as indicated.  Allergies and medications reviewed and updated. Data reviewed: Chart in Epic.   Past Medical History:  Diagnosis Date   Anemia of chronic disease    Chronic HFrEF (heart failure with reduced ejection fraction) (HCC)    Chronic kidney disease, stage 3a (HCC)    Coronary artery disease    Dr. Deborah Chalk years ago. No details.   Diabetes mellitus with circulatory complication (HCC)    Diabetic foot infection (HCC)    Encephalopathy    Former tobacco use    Lactic acidosis 11/28/2021    Multifocal pneumonia 11/28/2021   PAD (peripheral artery disease) (HCC)    Peripheral neuropathy    SDH (subdural hematoma) (HCC)    Severe pulmonary hypertension (HCC)     Past Surgical History:  Procedure Laterality Date   ABDOMINAL AORTOGRAM W/LOWER EXTREMITY Right 11/13/2021   Procedure: ABDOMINAL AORTOGRAM W/LOWER EXTREMITY;  Surgeon: Leonie Douglas, MD;  Location: [facility];  Service: Cardiovascular;  Laterality: Right;   AMPUTATION Right 05/23/2022   Procedure: AMPUTATION RAY;  Surgeon: Pilar Plate, DPM;  Location: [facility];  Service: Podiatry;  Laterality: Right;  Partial 5th ray amputation   AMPUTATION Right 08/26/2022   Procedure: MIDFOOT AMPUTATION RIGHT FOOT, BONE BIOPSY RIGHT FOOT, TENDO ACHILIES LENGTHENING;  Surgeon: Felecia Shelling, DPM;  Location: [facility];  Service: Podiatry;  Laterality: Right;   APPLICATION OF WOUND VAC Right 08/26/2022   Procedure: APPLICATION OF WOUND VAC;  Surgeon: Felecia Shelling, DPM;  Location: [facility];  Service: Podiatry;  Laterality: Right;   CORONARY ANGIOPLASTY WITH STENT PLACEMENT     IR FLUORO GUIDE CV LINE RIGHT  08/27/2022   IR REMOVAL TUN CV CATH W/O FL  10/08/2022   IR US GUIDE VASC ACCESS RIGHT  08/27/2022   IRRIGATION AND DEBRIDEMENT FOOT Right 05/25/2022   Procedure: IRRIGATION AND DEBRIDEMENT FOOT;  Surgeon: Vivi Barrack, DPM;  Location: [facility];  Service: Podiatry;  Laterality: Right;    Social History   Socioeconomic History   Marital status: Divorced  Spouse name: Not on file   Number of children: Not on file   Years of education: Not on file   Highest education level: Not on file  Occupational History   Not on file  Tobacco Use   Smoking status: Former    Current packs/day: 0.00    Types: Cigarettes    Quit date: 10    Years since quitting: 31.7   Smokeless tobacco: Never  Vaping Use   Vaping status: Never Used  Substance and Sexual Activity   Alcohol use: No   Drug use: No   Sexual activity: Not  Currently  Other Topics Concern   Not on file  Social History Narrative   Not on file   Social Determinants of Health   Financial Resource Strain: Low Risk  (05/05/2022)   Overall Financial Resource Strain (CARDIA)    Difficulty of Paying Living Expenses: Not hard at all  Food Insecurity: No Food Insecurity (05/22/2022)   Hunger Vital Sign    Worried About Running Out of Food in the Last Year: Never true    Ran Out of Food in the Last Year: Never true  Transportation Needs: No Transportation Needs (05/22/2022)   PRAPARE - Administrator, Civil Service (Medical): No    Lack of Transportation (Non-Medical): No  Physical Activity: Inactive (05/05/2022)   Exercise Vital Sign    Days of Exercise per Week: 0 days    Minutes of Exercise per Session: 0 min  Stress: No Stress Concern Present (05/05/2022)   [employer] of Occupational Health - Occupational Stress Questionnaire    Feeling of Stress : Not at all  Social Connections: Socially Isolated (05/05/2022)   Social Connection and Isolation Panel [NHANES]    Frequency of Communication with Friends and Family: More than three times a week    Frequency of Social Gatherings with Friends and Family: More than three times a week    Attends Religious Services: Never    Database administrator or Organizations: No    Attends Banker Meetings: Never    Marital Status: Divorced  Catering manager Violence: Not At Risk (05/22/2022)   Humiliation, Afraid, Rape, and Kick questionnaire    Fear of Current or Ex-Partner: No    Emotionally Abused: No    Physically Abused: No    Sexually Abused: No    Outpatient Encounter Medications as of 12/10/2022  Medication Sig   Accu-Chek Softclix Lancets lancets Use 4 times daily as directed to check blood sugars.   fluconazole (DIFLUCAN) 150 MG tablet Take 1 tablet (150 mg total) by mouth once for 1 dose.   furosemide (LASIX) 40 MG tablet TAKE 1 TABLET BY MOUTH EVERY DAY (Patient taking  differently: Take 40 mg by mouth daily. Takes every other day)   glipiZIDE (GLUCOTROL) 5 MG tablet TAKE 1 TABLET BY MOUTH EVERY DAY BEFORE BREAKFAST   nystatin cream (MYCOSTATIN) Apply 1 Application topically 2 (two) times daily.   lisinopril (ZESTRIL) 5 MG tablet Take 1 tablet (5 mg total) by mouth daily. (Patient not taking: Reported on 12/10/2022)   metoprolol succinate (TOPROL-XL) 25 MG 24 hr tablet TAKE 1/2 TABLET BY MOUTH DAILY (Patient not taking: Reported on 12/10/2022)   [DISCONTINUED] Continuous Glucose Receiver (FREESTYLE LIBRE 3 READER) DEVI 1 Device by Does not apply route in the morning, at noon, in the evening, and at bedtime.   [DISCONTINUED] Continuous Glucose Sensor (FREESTYLE LIBRE 3 SENSOR) MISC 1 Device by Does not apply route  4 (four) times daily. Place 1 sensor on the skin every 14 days. Use to check glucose continuously   [DISCONTINUED] dapagliflozin propanediol (FARXIGA) 10 MG TABS tablet Take 1 tablet (10 mg total) by mouth daily. (Patient not taking: Reported on 12/10/2022)   No facility-administered encounter medications on file as of 12/10/2022.    No Known Allergies  Review of Systems  Constitutional:  Negative for activity change, appetite change, chills, fatigue and fever.  HENT: Negative.    Eyes: Negative.   Respiratory:  Negative for cough, chest tightness and shortness of breath.   Cardiovascular:  Negative for chest pain, palpitations and leg swelling.  Gastrointestinal:  Negative for blood in stool, constipation, diarrhea, nausea and vomiting.  Endocrine: Negative.   Genitourinary:  Negative for dysuria, frequency and urgency.  Musculoskeletal:  Negative for arthralgias and myalgias.  Skin:  Positive for color change and rash.  Allergic/Immunologic: Negative.   Neurological:  Negative for dizziness and headaches.  Hematological: Negative.   Psychiatric/Behavioral:  Negative for confusion, hallucinations, sleep disturbance and suicidal ideas.   All other  systems reviewed and are negative.       Objective:  BP (!) 204/72   Pulse 67   Temp (!) 96.8 F (36 C) (Temporal)   Ht 6\' 1"  (1.854 m)   Wt 183 lb 3.2 oz (83.1 kg)   SpO2 99%   BMI 24.17 kg/m    Wt Readings from Last 3 Encounters:  12/10/22 183 lb 3.2 oz (83.1 kg)  10/12/22 173 lb 3 oz (78.6 kg)  10/06/22 181 lb (82.1 kg)    Physical Exam Vitals and nursing note reviewed.  Constitutional:      Appearance: Normal appearance.  HENT:     Head: Normocephalic and atraumatic.     Mouth/Throat:     Mouth: Mucous membranes are moist.  Eyes:     Conjunctiva/sclera: Conjunctivae normal.     Pupils: Pupils are equal, round, and reactive to light.  Cardiovascular:     Rate and Rhythm: Normal rate and regular rhythm.     Heart sounds: Normal heart sounds.  Pulmonary:     Effort: Pulmonary effort is normal.     Breath sounds: Normal breath sounds.  Musculoskeletal:     Cervical back: Normal range of motion and neck supple.     Right lower leg: No edema.     Left lower leg: No edema.  Skin:    General: Skin is warm and dry.     Capillary Refill: Capillary refill takes less than 2 seconds.     Findings: Rash (Red beefy rash to groin area.) present.  Neurological:     Mental Status: He is alert. Mental status is at baseline.     Gait: Gait abnormal (Slow, using cane).     Results for orders placed or performed in visit on 10/07/22  Basic metabolic panel  Result Value Ref Range   Glucose, Bld 140 (H) 65 - 99 mg/dL   BUN 22 7 - 25 mg/dL   Creat 1.61 (H) 0.96 - 1.22 mg/dL   BUN/Creatinine Ratio 14 6 - 22 (calc)   Sodium 142 135 - 146 mmol/L   Potassium 3.7 3.5 - 5.3 mmol/L   Chloride 102 98 - 110 mmol/L   CO2 28 20 - 32 mmol/L   Calcium 9.3 8.6 - 10.3 mg/dL  CBC with Differential/Platelet  Result Value Ref Range   WBC 5.0 3.8 - 10.8 Thousand/uL   RBC 4.79 4.20 - 5.80 Million/uL  Hemoglobin 13.8 13.2 - 17.1 g/dL   HCT 16.1 09.6 - 04.5 %   MCV 87.5 80.0 - 100.0 fL    MCH 28.8 27.0 - 33.0 pg   MCHC 32.9 32.0 - 36.0 g/dL   RDW 40.9 (H) 81.1 - 91.4 %   Platelets 153 140 - 400 Thousand/uL   MPV 11.6 7.5 - 12.5 fL   Neutro Abs 2,240 1,500 - 7,800 cells/uL   Lymphs Abs 1,550 850 - 3,900 cells/uL   Absolute Monocytes 540 200 - 950 cells/uL   Eosinophils Absolute 610 (H) 15 - 500 cells/uL   Basophils Absolute 60 0 - 200 cells/uL   Neutrophils Relative % 44.8 %   Total Lymphocyte 31.0 %   Monocytes Relative 10.8 %   Eosinophils Relative 12.2 %   Basophils Relative 1.2 %  C-reactive protein  Result Value Ref Range   CRP 4.3 <8.0 mg/L  Sedimentation rate  Result Value Ref Range   Sed Rate 34 (H) 0 - 20 mm/h       Pertinent labs & imaging results that were available during my care of the patient were reviewed by me and considered in my medical decision making.  Assessment & Plan:  Wayne "Al" was seen today for medical management of chronic issues and rash.  Diagnoses and all orders for this visit:  Type 2 diabetes mellitus with other specified complication, without long-term current use of insulin (HCC) Will check below labs as ordered.  Diet and exercise encouraged. -     CMP14+EGFR -     CBC with Differential/Platelet  Hypertension associated with diabetes (HCC) Blood pressure today in office elevated, repeat manual 178/88.  Readings from home monitor to the high, significant variations in reading.  Pill blood pressure machines at home might need to be compared at next office visit. Daughter aware.  Will check below labs.  Daughter educated on proper use of blood pressure medication to manage hypertension.  Patient needs to be taking lisinopril daily. -     CMP14+EGFR -     CBC with Differential/Platelet  Yeast dermatitis Will treat with below. Symptomatic management discussed in detail.  -     nystatin cream (MYCOSTATIN); Apply 1 Application topically 2 (two) times daily. -     fluconazole (DIFLUCAN) 150 MG tablet; Take 1 tablet (150 mg  total) by mouth once for 1 dose.     Continue all other maintenance medications.  Follow up plan: Return in about 3 months (around 03/11/2023), or if symptoms worsen or fail to improve, for DM.   Continue healthy lifestyle choices, including diet (rich in fruits, vegetables, and lean proteins, and low in salt and simple carbohydrates) and exercise (at least 30 minutes of moderate physical activity daily).   The above assessment and management plan was discussed with the patient. The patient verbalized understanding of and has agreed to the management plan. Patient is aware to call the clinic if they develop any new symptoms or if symptoms persist or worsen. Patient is aware when to return to the clinic for a follow-up visit. Patient educated on when it is appropriate to go to the emergency department.   Kari Baars, FNP-C [facility] [phone]

## 2022-12-11 LAB — CBC WITH DIFFERENTIAL/PLATELET
Basophils Absolute: 0 10*3/uL (ref 0.0–0.2)
Basos: 1 %
EOS (ABSOLUTE): 0.3 10*3/uL (ref 0.0–0.4)
Eos: 7 %
Hematocrit: 45.4 % (ref 37.5–51.0)
Hemoglobin: 15.3 g/dL (ref 13.0–17.7)
Immature Grans (Abs): 0 10*3/uL (ref 0.0–0.1)
Immature Granulocytes: 0 %
Lymphocytes Absolute: 1.9 10*3/uL (ref 0.7–3.1)
Lymphs: 37 %
MCH: 29.4 pg (ref 26.6–33.0)
MCHC: 33.7 g/dL (ref 31.5–35.7)
MCV: 87 fL (ref 79–97)
Monocytes Absolute: 0.5 10*3/uL (ref 0.1–0.9)
Monocytes: 11 %
Neutrophils Absolute: 2.3 10*3/uL (ref 1.4–7.0)
Neutrophils: 44 %
Platelets: 126 10*3/uL — ABNORMAL LOW (ref 150–450)
RBC: 5.2 x10E6/uL (ref 4.14–5.80)
RDW: 13.3 % (ref 11.6–15.4)
WBC: 5.1 10*3/uL (ref 3.4–10.8)

## 2022-12-11 LAB — CMP14+EGFR
ALT: 11 IU/L (ref 0–44)
AST: 12 IU/L (ref 0–40)
Albumin: 4.4 g/dL (ref 3.7–4.7)
Alkaline Phosphatase: 99 IU/L (ref 44–121)
BUN/Creatinine Ratio: 13 (ref 10–24)
BUN: 15 mg/dL (ref 8–27)
Bilirubin Total: 0.6 mg/dL (ref 0.0–1.2)
CO2: 22 mmol/L (ref 20–29)
Calcium: 9 mg/dL (ref 8.6–10.2)
Chloride: 102 mmol/L (ref 96–106)
Creatinine, Ser: 1.19 mg/dL (ref 0.76–1.27)
Globulin, Total: 2.3 g/dL (ref 1.5–4.5)
Glucose: 210 mg/dL — ABNORMAL HIGH (ref 70–99)
Potassium: 4.3 mmol/L (ref 3.5–5.2)
Sodium: 141 mmol/L (ref 134–144)
Total Protein: 6.7 g/dL (ref 6.0–8.5)
eGFR: 61 mL/min/{1.73_m2} (ref >59)

## 2022-12-13 ENCOUNTER — Encounter: Payer: Self-pay | Admitting: Family Medicine

## 2022-12-13 DIAGNOSIS — B372 Candidiasis of skin and nail: Secondary | ICD-10-CM

## 2022-12-16 DIAGNOSIS — H25811 Combined forms of age-related cataract, right eye: Secondary | ICD-10-CM | POA: Diagnosis not present

## 2022-12-16 DIAGNOSIS — H2511 Age-related nuclear cataract, right eye: Secondary | ICD-10-CM | POA: Diagnosis not present

## 2022-12-16 DIAGNOSIS — H25011 Cortical age-related cataract, right eye: Secondary | ICD-10-CM | POA: Diagnosis not present

## 2022-12-17 ENCOUNTER — Ambulatory Visit: Payer: Medicare Other | Admitting: Family Medicine

## 2022-12-19 ENCOUNTER — Other Ambulatory Visit: Payer: Self-pay | Admitting: Family Medicine

## 2022-12-19 DIAGNOSIS — B372 Candidiasis of skin and nail: Secondary | ICD-10-CM

## 2022-12-20 MED ORDER — NYSTATIN 100000 UNIT/GM EX CREA: 1.0000 | TOPICAL_CREAM | Freq: Two times a day (BID) | CUTANEOUS | 0 refills | Status: DC

## 2022-12-21 ENCOUNTER — Ambulatory Visit: Payer: Medicare Other | Admitting: Family Medicine

## 2022-12-24 ENCOUNTER — Ambulatory Visit: Payer: Medicare Other | Admitting: Family Medicine

## 2022-12-26 DIAGNOSIS — M869 Osteomyelitis, unspecified: Secondary | ICD-10-CM | POA: Diagnosis not present

## 2022-12-26 DIAGNOSIS — N1831 Chronic kidney disease, stage 3a: Secondary | ICD-10-CM | POA: Diagnosis not present

## 2022-12-26 DIAGNOSIS — E1159 Type 2 diabetes mellitus with other circulatory complications: Secondary | ICD-10-CM | POA: Diagnosis not present

## 2022-12-26 DIAGNOSIS — E440 Moderate protein-calorie malnutrition: Secondary | ICD-10-CM | POA: Diagnosis not present

## 2022-12-30 DIAGNOSIS — H268 Other specified cataract: Secondary | ICD-10-CM | POA: Diagnosis not present

## 2022-12-30 DIAGNOSIS — H25812 Combined forms of age-related cataract, left eye: Secondary | ICD-10-CM | POA: Diagnosis not present

## 2022-12-30 DIAGNOSIS — H2512 Age-related nuclear cataract, left eye: Secondary | ICD-10-CM | POA: Diagnosis not present

## 2022-12-30 DIAGNOSIS — H25012 Cortical age-related cataract, left eye: Secondary | ICD-10-CM | POA: Diagnosis not present

## 2023-01-01 ENCOUNTER — Other Ambulatory Visit: Payer: Self-pay | Admitting: Adult Health

## 2023-01-05 ENCOUNTER — Other Ambulatory Visit: Payer: Self-pay | Admitting: Adult Health

## 2023-01-25 DIAGNOSIS — M869 Osteomyelitis, unspecified: Secondary | ICD-10-CM | POA: Diagnosis not present

## 2023-01-25 DIAGNOSIS — E1159 Type 2 diabetes mellitus with other circulatory complications: Secondary | ICD-10-CM | POA: Diagnosis not present

## 2023-01-25 DIAGNOSIS — N1831 Chronic kidney disease, stage 3a: Secondary | ICD-10-CM | POA: Diagnosis not present

## 2023-01-25 DIAGNOSIS — E440 Moderate protein-calorie malnutrition: Secondary | ICD-10-CM | POA: Diagnosis not present

## 2023-02-04 DIAGNOSIS — E113293 Type 2 diabetes mellitus with mild nonproliferative diabetic retinopathy without macular edema, bilateral: Secondary | ICD-10-CM | POA: Diagnosis not present

## 2023-02-15 ENCOUNTER — Telehealth: Payer: Self-pay | Admitting: Family Medicine

## 2023-02-15 DIAGNOSIS — Z0279 Encounter for issue of other medical certificate: Secondary | ICD-10-CM

## 2023-02-15 NOTE — Telephone Encounter (Signed)
Patient dropped off handicap forms to be completed and signed.  Form Fee Paid? (Y/N)       y     If NO, form is placed on front office manager desk to hold until payment received. If YES, then form will be placed in the RX/HH Nurse Coordinators box for completion.  Form will not be processed until payment is received

## 2023-02-21 DIAGNOSIS — Z961 Presence of intraocular lens: Secondary | ICD-10-CM | POA: Diagnosis not present

## 2023-02-25 DIAGNOSIS — E1159 Type 2 diabetes mellitus with other circulatory complications: Secondary | ICD-10-CM | POA: Diagnosis not present

## 2023-02-25 DIAGNOSIS — M869 Osteomyelitis, unspecified: Secondary | ICD-10-CM | POA: Diagnosis not present

## 2023-02-25 DIAGNOSIS — N1831 Chronic kidney disease, stage 3a: Secondary | ICD-10-CM | POA: Diagnosis not present

## 2023-02-25 DIAGNOSIS — E440 Moderate protein-calorie malnutrition: Secondary | ICD-10-CM | POA: Diagnosis not present

## 2023-03-11 ENCOUNTER — Ambulatory Visit: Payer: Medicare Other | Admitting: Nurse Practitioner

## 2023-03-11 ENCOUNTER — Encounter: Payer: Self-pay | Admitting: Family Medicine

## 2023-03-11 ENCOUNTER — Ambulatory Visit: Payer: Medicare Other | Admitting: Family Medicine

## 2023-03-11 ENCOUNTER — Ambulatory Visit (INDEPENDENT_AMBULATORY_CARE_PROVIDER_SITE_OTHER): Payer: Medicare Other | Admitting: Family Medicine

## 2023-03-11 VITALS — BP 156/70 | HR 73 | Temp 96.9°F | Ht 73.0 in | Wt 183.0 lb

## 2023-03-11 DIAGNOSIS — E1169 Type 2 diabetes mellitus with other specified complication: Secondary | ICD-10-CM

## 2023-03-11 DIAGNOSIS — Z7984 Long term (current) use of oral hypoglycemic drugs: Secondary | ICD-10-CM | POA: Diagnosis not present

## 2023-03-11 DIAGNOSIS — B372 Candidiasis of skin and nail: Secondary | ICD-10-CM | POA: Diagnosis not present

## 2023-03-11 DIAGNOSIS — I129 Hypertensive chronic kidney disease with stage 1 through stage 4 chronic kidney disease, or unspecified chronic kidney disease: Secondary | ICD-10-CM | POA: Diagnosis not present

## 2023-03-11 DIAGNOSIS — W548XXA Other contact with dog, initial encounter: Secondary | ICD-10-CM

## 2023-03-11 DIAGNOSIS — N183 Chronic kidney disease, stage 3 unspecified: Secondary | ICD-10-CM

## 2023-03-11 DIAGNOSIS — E785 Hyperlipidemia, unspecified: Secondary | ICD-10-CM

## 2023-03-11 DIAGNOSIS — L03113 Cellulitis of right upper limb: Secondary | ICD-10-CM | POA: Diagnosis not present

## 2023-03-11 DIAGNOSIS — E1122 Type 2 diabetes mellitus with diabetic chronic kidney disease: Secondary | ICD-10-CM

## 2023-03-11 DIAGNOSIS — R300 Dysuria: Secondary | ICD-10-CM

## 2023-03-11 DIAGNOSIS — H6123 Impacted cerumen, bilateral: Secondary | ICD-10-CM | POA: Diagnosis not present

## 2023-03-11 DIAGNOSIS — Z23 Encounter for immunization: Secondary | ICD-10-CM

## 2023-03-11 LAB — URINALYSIS, ROUTINE W REFLEX MICROSCOPIC
Bilirubin, UA: NEGATIVE
Glucose, UA: NEGATIVE
Ketones, UA: NEGATIVE
Leukocytes,UA: NEGATIVE
Nitrite, UA: NEGATIVE
Specific Gravity, UA: 1.02 (ref 1.005–1.030)
Urobilinogen, Ur: 0.2 mg/dL (ref 0.2–1.0)
pH, UA: 5.5 (ref 5.0–7.5)

## 2023-03-11 LAB — MICROSCOPIC EXAMINATION
Bacteria, UA: NONE SEEN
Renal Epithel, UA: NONE SEEN /[HPF]
Yeast, UA: NONE SEEN

## 2023-03-11 LAB — BAYER DCA HB A1C WAIVED: HB A1C (BAYER DCA - WAIVED): 4.3 % — ABNORMAL LOW (ref 4.8–5.6)

## 2023-03-11 LAB — LIPID PANEL

## 2023-03-11 MED ORDER — SULFAMETHOXAZOLE-TRIMETHOPRIM 800-160 MG PO TABS
1.0000 | ORAL_TABLET | Freq: Two times a day (BID) | ORAL | 0 refills | Status: AC
Start: 2023-03-11 — End: 2023-03-18

## 2023-03-11 MED ORDER — DEBROX 6.5 % OT SOLN: 5.0000 [drp] | Freq: Two times a day (BID) | OTIC | 0 refills | Status: DC

## 2023-03-11 MED ORDER — KETOCONAZOLE 2 % EX CREA: 1.0000 | TOPICAL_CREAM | Freq: Every day | CUTANEOUS | 0 refills | Status: DC

## 2023-03-11 MED ORDER — FLUCONAZOLE 150 MG PO TABS
150.0000 mg | ORAL_TABLET | ORAL | 0 refills | Status: AC
Start: 2023-03-11 — End: 2023-03-26

## 2023-03-11 MED ORDER — TRIAMCINOLONE ACETONIDE 0.1 % EX CREA
1.0000 | TOPICAL_CREAM | Freq: Two times a day (BID) | CUTANEOUS | 0 refills | Status: DC
Start: 2023-03-11 — End: 2023-04-03

## 2023-03-11 NOTE — Progress Notes (Signed)
Subjective:  Patient ID: Wayne [name], male    DOB: 04/10/1940, 82 y.o.   MRN: [id number]  Patient Care Team: Sonny Masters, FNP as PCP - General (Family Medicine) Christell Constant, MD as PCP - Cardiology (Cardiology) Vivi Barrack, DPM as Consulting Physician (Podiatry) Jodelle Gross, NP as Nurse Practitioner (Cardiology)   Chief Complaint:  elevated BS (Been going about 200 since thanksgiving /), Rash (Groin itching x 3 months), and dog scratch  (X 3 days right hand)   HPI: Wayne [name] is a 82 y.o. male presenting on 03/11/2023 for elevated BS (Been going about 200 since thanksgiving /), Rash (Groin itching x 3 months), and dog scratch  (X 3 days right hand)   Discussed the use of AI scribe software for clinical note transcription with the patient, who gave verbal consent to proceed.  History of Present Illness   The patient, with a history of diabetes, presents with persistently elevated blood glucose levels since Thanksgiving. Despite an increase in glipizide to 10mg  twice daily, blood glucose levels have remained between 202 and 268. The patient's caregiver reports no changes in thirst, urination, or appetite.  Additionally, the patient has a persistent, bothersome rash in the groin area, which has not improved despite previous treatment with a single dose of Diflucan. The patient also has a new rash on the arm, which is itchy and was recently scratched by a dog, leading to swelling.  The patient's hearing has been muffled, which the caregiver attributes to possible earwax buildup. The patient's bowel movements are reported as normal. The patient has been consuming electrolyte drinks for hydration, but the caregiver is concerned about potential overhydration.      He has not been taking his blood pressure medications due to low normal blood pressure readings.   Pt denies any symptoms.     Relevant past medical, surgical, family, and social  history reviewed and updated as indicated.  Allergies and medications reviewed and updated. Data reviewed: Chart in Epic.   Past Medical History:  Diagnosis Date   Anemia of chronic disease    Chronic HFrEF (heart failure with reduced ejection fraction) (HCC)    Chronic kidney disease, stage 3a (HCC)    Coronary artery disease    Dr. Deborah Chalk years ago. No details.   Diabetes mellitus with circulatory complication (HCC)    Diabetic foot infection (HCC)    Encephalopathy    Former tobacco use    Lactic acidosis 11/28/2021   Multifocal pneumonia 11/28/2021   PAD (peripheral artery disease) (HCC)    Peripheral neuropathy    SDH (subdural hematoma) (HCC)    Severe pulmonary hypertension (HCC)     Past Surgical History:  Procedure Laterality Date   ABDOMINAL AORTOGRAM W/LOWER EXTREMITY Right 11/13/2021   Procedure: ABDOMINAL AORTOGRAM W/LOWER EXTREMITY;  Surgeon: Leonie Douglas, MD;  Location: [facility];  Service: Cardiovascular;  Laterality: Right;   AMPUTATION Right 05/23/2022   Procedure: AMPUTATION RAY;  Surgeon: Pilar Plate, DPM;  Location: [facility];  Service: Podiatry;  Laterality: Right;  Partial 5th ray amputation   AMPUTATION Right 08/26/2022   Procedure: MIDFOOT AMPUTATION RIGHT FOOT, BONE BIOPSY RIGHT FOOT, TENDO ACHILIES LENGTHENING;  Surgeon: Felecia Shelling, DPM;  Location: [facility];  Service: Podiatry;  Laterality: Right;   APPLICATION OF WOUND VAC Right 08/26/2022   Procedure: APPLICATION OF WOUND VAC;  Surgeon: Felecia Shelling, DPM;  Location: [facility];  Service: Podiatry;  Laterality: Right;  CORONARY ANGIOPLASTY WITH STENT PLACEMENT     IR FLUORO GUIDE CV LINE RIGHT  08/27/2022   IR REMOVAL TUN CV CATH W/O FL  10/08/2022   IR US GUIDE VASC ACCESS RIGHT  08/27/2022   IRRIGATION AND DEBRIDEMENT FOOT Right 05/25/2022   Procedure: IRRIGATION AND DEBRIDEMENT FOOT;  Surgeon: Vivi Barrack, DPM;  Location: [facility];  Service: Podiatry;  Laterality: Right;     Social History   Socioeconomic History   Marital status: Divorced    Spouse name: Not on file   Number of children: Not on file   Years of education: Not on file   Highest education level: Not on file  Occupational History   Not on file  Tobacco Use   Smoking status: Former    Current packs/day: 0.00    Types: Cigarettes    Quit date: 67    Years since quitting: 31.9   Smokeless tobacco: Never  Vaping Use   Vaping status: Never Used  Substance and Sexual Activity   Alcohol use: No   Drug use: No   Sexual activity: Not Currently  Other Topics Concern   Not on file  Social History Narrative   Not on file   Social Drivers of Health   Financial Resource Strain: Low Risk  (05/05/2022)   Overall Financial Resource Strain (CARDIA)    Difficulty of Paying Living Expenses: Not hard at all  Food Insecurity: No Food Insecurity (05/22/2022)   Hunger Vital Sign    Worried About Running Out of Food in the Last Year: Never true    Ran Out of Food in the Last Year: Never true  Transportation Needs: No Transportation Needs (05/22/2022)   PRAPARE - Administrator, Civil Service (Medical): No    Lack of Transportation (Non-Medical): No  Physical Activity: Inactive (05/05/2022)   Exercise Vital Sign    Days of Exercise per Week: 0 days    Minutes of Exercise per Session: 0 min  Stress: No Stress Concern Present (05/05/2022)   [employer] of Occupational Health - Occupational Stress Questionnaire    Feeling of Stress : Not at all  Social Connections: Socially Isolated (05/05/2022)   Social Connection and Isolation Panel [NHANES]    Frequency of Communication with Friends and Family: More than three times a week    Frequency of Social Gatherings with Friends and Family: More than three times a week    Attends Religious Services: Never    Database administrator or Organizations: No    Attends Banker Meetings: Never    Marital Status: Divorced  Careers information officer Violence: Not At Risk (05/22/2022)   Humiliation, Afraid, Rape, and Kick questionnaire    Fear of Current or Ex-Partner: No    Emotionally Abused: No    Physically Abused: No    Sexually Abused: No    Outpatient Encounter Medications as of 03/11/2023  Medication Sig   Accu-Chek Softclix Lancets lancets Use 4 times daily as directed to check blood sugars.   carbamide peroxide (DEBROX) 6.5 % OTIC solution Place 5 drops into both ears 2 (two) times daily.   fluconazole (DIFLUCAN) 150 MG tablet Take 1 tablet (150 mg total) by mouth every 14 (fourteen) days for 2 doses.   furosemide (LASIX) 40 MG tablet TAKE 1 TABLET BY MOUTH EVERY DAY (Patient taking differently: Take 40 mg by mouth daily. Takes every other day)   glipiZIDE (GLUCOTROL) 5 MG tablet TAKE 1 TABLET BY MOUTH  EVERY DAY BEFORE BREAKFAST   ketoconazole (NIZORAL) 2 % cream Apply 1 Application topically daily.   nystatin cream (MYCOSTATIN) Apply 1 Application topically 2 (two) times daily.   sulfamethoxazole-trimethoprim (BACTRIM DS) 800-160 MG tablet Take 1 tablet by mouth 2 (two) times daily for 7 days.   triamcinolone cream (KENALOG) 0.1 % Apply 1 Application topically 2 (two) times daily.   lisinopril (ZESTRIL) 5 MG tablet Take 1 tablet (5 mg total) by mouth daily. (Patient not taking: Reported on 03/11/2023)   metoprolol succinate (TOPROL-XL) 25 MG 24 hr tablet TAKE 1/2 TABLET BY MOUTH DAILY (Patient not taking: Reported on 03/11/2023)   No facility-administered encounter medications on file as of 03/11/2023.    No Known Allergies  Pertinent ROS per HPI, otherwise unremarkable      Objective:  BP (!) 156/70 Comment: at home reading  Pulse 73   Temp (!) 96.9 F (36.1 C)   Ht 6\' 1"  (1.854 m)   Wt 183 lb (83 kg)   SpO2 97%   BMI 24.14 kg/m    Wt Readings from Last 3 Encounters:  03/11/23 183 lb (83 kg)  12/10/22 183 lb 3.2 oz (83.1 kg)  10/12/22 173 lb 3 oz (78.6 kg)    Physical Exam Vitals and nursing  note reviewed.  Constitutional:      General: He is not in acute distress.    Appearance: Normal appearance. He is normal weight. He is ill-appearing (chronically). He is not toxic-appearing or diaphoretic.  HENT:     Head: Normocephalic and atraumatic.     Right Ear: There is impacted cerumen.     Left Ear: There is impacted cerumen.     Nose: Nose normal.     Mouth/Throat:     Mouth: Mucous membranes are moist.  Eyes:     Conjunctiva/sclera: Conjunctivae normal.     Pupils: Pupils are equal, round, and reactive to light.  Cardiovascular:     Rate and Rhythm: Normal rate and regular rhythm.     Heart sounds: Normal heart sounds.  Pulmonary:     Effort: Pulmonary effort is normal.     Breath sounds: Normal breath sounds.  Musculoskeletal:     Right lower leg: Edema (trace) present.     Left lower leg: Edema (trace) present.  Skin:    General: Skin is warm and dry.     Capillary Refill: Capillary refill takes less than 2 seconds.       Neurological:     General: No focal deficit present.     Mental Status: He is alert. Mental status is at baseline.     Gait: Gait abnormal (using assitive device).  Psychiatric:        Mood and Affect: Mood normal.        Behavior: Behavior normal.    Physical Exam   HEENT: Hard, dark brown wax in both ears SKIN: Rash in groin area, erythematous, initially clearing on one side. Recent scratch by dog, area erythematous and edematous indicating cellulitis on right hand.        Results for orders placed or performed in visit on 12/10/22  CMP14+EGFR   Collection Time: 12/10/22  3:00 PM  Result Value Ref Range   Glucose 210 (H) 70 - 99 mg/dL   BUN 15 8 - 27 mg/dL   Creatinine, Ser 6.29 0.76 - 1.27 mg/dL   eGFR 61 >52 WU/XLK/4.40   BUN/Creatinine Ratio 13 10 - 24   Sodium 141 134 - 144  mmol/L   Potassium 4.3 3.5 - 5.2 mmol/L   Chloride 102 96 - 106 mmol/L   CO2 22 20 - 29 mmol/L   Calcium 9.0 8.6 - 10.2 mg/dL   Total Protein 6.7 6.0 -  8.5 g/dL   Albumin 4.4 3.7 - 4.7 g/dL   Globulin, Total 2.3 1.5 - 4.5 g/dL   Bilirubin Total 0.6 0.0 - 1.2 mg/dL   Alkaline Phosphatase 99 44 - 121 IU/L   AST 12 0 - 40 IU/L   ALT 11 0 - 44 IU/L  CBC with Differential/Platelet   Collection Time: 12/10/22  3:00 PM  Result Value Ref Range   WBC 5.1 3.4 - 10.8 x10E3/uL   RBC 5.20 4.14 - 5.80 x10E6/uL   Hemoglobin 15.3 13.0 - 17.7 g/dL   Hematocrit 16.1 09.6 - 51.0 %   MCV 87 79 - 97 fL   MCH 29.4 26.6 - 33.0 pg   MCHC 33.7 31.5 - 35.7 g/dL   RDW 04.5 40.9 - 81.1 %   Platelets 126 (L) 150 - 450 x10E3/uL   Neutrophils 44 Not Estab. %   Lymphs 37 Not Estab. %   Monocytes 11 Not Estab. %   Eos 7 Not Estab. %   Basos 1 Not Estab. %   Neutrophils Absolute 2.3 1.4 - 7.0 x10E3/uL   Lymphocytes Absolute 1.9 0.7 - 3.1 x10E3/uL   Monocytes Absolute 0.5 0.1 - 0.9 x10E3/uL   EOS (ABSOLUTE) 0.3 0.0 - 0.4 x10E3/uL   Basophils Absolute 0.0 0.0 - 0.2 x10E3/uL   Immature Granulocytes 0 Not Estab. %   Immature Grans (Abs) 0.0 0.0 - 0.1 x10E3/uL       Pertinent labs & imaging results that were available during my care of the patient were reviewed by me and considered in my medical decision making.  Assessment & Plan:  Zayshawn "Al" was seen today for elevated bs, rash and dog scratch .  Diagnoses and all orders for this visit:  Dysuria -     Urinalysis, Routine w reflex microscopic -     Urine Culture  Bilateral impacted cerumen -     carbamide peroxide (DEBROX) 6.5 % OTIC solution; Place 5 drops into both ears 2 (two) times daily.  Dog scratch -     Td vaccine greater than or equal to 7yo preservative free IM  Cellulitis of right hand -     sulfamethoxazole-trimethoprim (BACTRIM DS) 800-160 MG tablet; Take 1 tablet by mouth 2 (two) times daily for 7 days. 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 -     ketoconazole (NIZORAL) 2 % cream; Apply 1 Application topically daily. -  fluconazole (DIFLUCAN) 150 MG tablet; Take 1 tablet (150 mg total) by mouth every 14 (fourteen) days for 2 doses.     Assessment and Plan    Hyperglycemia   Persistent hyperglycemia since Thanksgiving with blood sugars ranging from 202 to 268 mg/dL. Currently managed with glipizide 10 mg twice daily. No increased urination or thirst reported. Likely exacerbated by underlying infection. Discussed potential renal function impact with increased glipizide dosage.   - Order A1c   - Check renal function   - Adjust diabetic medications based on lab results    Cellulitis   Cellulitis on the arm following a dog scratch three days ago. Area is red and swollen, indicating infection. Discussed the importance of tetanus immunization due to the nature of the injury.   - Prescribe antibiotics   - Administer tetanus shot    Candidiasis   Persistent beefy red rash in the groin area. Previous treatment with Diflucan. Plan to use antifungal and steroid cream for irritation and fungal infection. Discussed the dual approach with Diflucan and topical treatments to ensure comprehensive management.   - Prescribe antifungal and steroid cream   - Administer Diflucan now and repeat in two weeks    Cerumen Impaction   Cerumen impaction in both ears causing muffled hearing. Hard, dark brown wax noted. Discussed the use  of Debrox to soften wax before irrigation.   - Recommend Debrox over-the-counter, use nightly for two weeks   - Return for ear irrigation after two weeks of Debrox use    General Health Maintenance   Tetanus immunization status needs updating due to dog scratch.   - Administer tetanus shot    Follow-up   - Review urine analysis and blood work before he leaves   - Schedule follow-up visit in two weeks for ear irrigation and review of lab results.          Continue all other maintenance medications.  Follow up plan: Return if symptoms worsen or fail to improve, for 2 weeks ear irrigation, 3 months DM.   Continue healthy lifestyle choices, including diet (rich in fruits, vegetables, and lean proteins, and low in salt and simple carbohydrates) and exercise (at least 30 minutes of moderate physical activity daily).  Educational handout given for cerumen buildup  The above assessment and management plan was discussed with the patient. The patient verbalized understanding of and has agreed to the management plan. Patient is aware to call the clinic if they develop any new symptoms or if symptoms persist or worsen. Patient is aware when to return to the clinic for a follow-up visit. Patient educated on when it is appropriate to go to the emergency department.   Kari Baars, FNP-C [facility] [phone]

## 2023-03-11 NOTE — Patient Instructions (Addendum)
Debrox nightly to both ears, follow up in 2 weeks to have ears cleaned.

## 2023-03-12 LAB — LIPID PANEL
Cholesterol, Total: 223 mg/dL — ABNORMAL HIGH (ref 100–199)
HDL: 27 mg/dL — ABNORMAL LOW (ref >39)
LDL CALC COMMENT:: 8.3 ratio — ABNORMAL HIGH (ref 0.0–5.0)
LDL Chol Calc (NIH): 133 mg/dL — ABNORMAL HIGH (ref 0–99)
Triglycerides: 347 mg/dL — ABNORMAL HIGH (ref 0–149)
VLDL Cholesterol Cal: 63 mg/dL — ABNORMAL HIGH (ref 5–40)

## 2023-03-12 LAB — CMP14+EGFR
ALT: 17 IU/L (ref 0–44)
AST: 21 IU/L (ref 0–40)
Albumin: 4 g/dL (ref 3.7–4.7)
Alkaline Phosphatase: 122 IU/L — ABNORMAL HIGH (ref 44–121)
BUN/Creatinine Ratio: 14 (ref 10–24)
BUN: 20 mg/dL (ref 8–27)
Bilirubin Total: 0.5 mg/dL (ref 0.0–1.2)
CO2: 21 mmol/L (ref 20–29)
Calcium: 8.9 mg/dL (ref 8.6–10.2)
Chloride: 101 mmol/L (ref 96–106)
Creatinine, Ser: 1.47 mg/dL — ABNORMAL HIGH (ref 0.76–1.27)
Globulin, Total: 2.9 g/dL (ref 1.5–4.5)
Glucose: 239 mg/dL — ABNORMAL HIGH (ref 70–99)
Potassium: 4.4 mmol/L (ref 3.5–5.2)
Sodium: 137 mmol/L (ref 134–144)
Total Protein: 6.9 g/dL (ref 6.0–8.5)
eGFR: 47 mL/min/{1.73_m2} — ABNORMAL LOW (ref >59)

## 2023-03-12 LAB — CBC WITH DIFFERENTIAL/PLATELET
Basophils Absolute: 0 10*3/uL (ref 0.0–0.2)
Basos: 1 %
EOS (ABSOLUTE): 0.3 10*3/uL (ref 0.0–0.4)
Eos: 6 %
Hematocrit: 42.4 % (ref 37.5–51.0)
Hemoglobin: 14.7 g/dL (ref 13.0–17.7)
Immature Grans (Abs): 0 10*3/uL (ref 0.0–0.1)
Immature Granulocytes: 1 %
Lymphocytes Absolute: 2.3 10*3/uL (ref 0.7–3.1)
Lymphs: 39 %
MCH: 30.5 pg (ref 26.6–33.0)
MCHC: 34.7 g/dL (ref 31.5–35.7)
MCV: 88 fL (ref 79–97)
Monocytes Absolute: 0.5 10*3/uL (ref 0.1–0.9)
Monocytes: 9 %
Neutrophils Absolute: 2.7 10*3/uL (ref 1.4–7.0)
Neutrophils: 44 %
Platelets: 159 10*3/uL (ref 150–450)
RBC: 4.82 x10E6/uL (ref 4.14–5.80)
RDW: 12.4 % (ref 11.6–15.4)
WBC: 5.8 10*3/uL (ref 3.4–10.8)

## 2023-03-12 LAB — THYROID PANEL WITH TSH
Free Thyroxine Index: 2.4 (ref 1.2–4.9)
T3 Uptake Ratio: 30 % (ref 24–39)
T4, Total: 8.1 ug/dL (ref 4.5–12.0)
TSH: 1.71 u[IU]/mL (ref 0.450–4.500)

## 2023-03-13 LAB — URINE CULTURE: Organism ID, Bacteria: NO GROWTH

## 2023-03-15 ENCOUNTER — Telehealth: Payer: Self-pay | Admitting: Family Medicine

## 2023-03-15 NOTE — Telephone Encounter (Unsigned)
Copied from CRM [id number]. Topic: Clinical - Lab/Test Results >> Mar 15, 2023  2:01 PM Fuller Mandril wrote: Reason for CRM: Pt daughter returned missed call for lab results. She did receive a Wellsite geologist from [place]. Advised if anything else she needs to be aware of that was not in message feel free to give her a call back. Thank You

## 2023-03-24 NOTE — Progress Notes (Signed)
No show

## 2023-03-29 ENCOUNTER — Other Ambulatory Visit: Payer: Self-pay | Admitting: Family Medicine

## 2023-03-29 DIAGNOSIS — E1151 Type 2 diabetes mellitus with diabetic peripheral angiopathy without gangrene: Secondary | ICD-10-CM

## 2023-03-30 ENCOUNTER — Other Ambulatory Visit: Payer: Self-pay | Admitting: Family Medicine

## 2023-03-30 DIAGNOSIS — I5043 Acute on chronic combined systolic (congestive) and diastolic (congestive) heart failure: Secondary | ICD-10-CM

## 2023-03-30 DIAGNOSIS — I152 Hypertension secondary to endocrine disorders: Secondary | ICD-10-CM

## 2023-04-03 ENCOUNTER — Other Ambulatory Visit: Payer: Self-pay | Admitting: Family Medicine

## 2023-04-03 DIAGNOSIS — B372 Candidiasis of skin and nail: Secondary | ICD-10-CM

## 2023-04-04 MED ORDER — KETOCONAZOLE 2 % EX CREA: 1.0000 | TOPICAL_CREAM | Freq: Every day | CUTANEOUS | 0 refills | Status: DC

## 2023-04-04 MED ORDER — TRIAMCINOLONE ACETONIDE 0.1 % EX CREA
1.0000 | TOPICAL_CREAM | Freq: Two times a day (BID) | CUTANEOUS | 0 refills | Status: DC
Start: 2023-04-04 — End: 2023-05-03

## 2023-04-06 ENCOUNTER — Encounter: Payer: Self-pay | Admitting: Family Medicine

## 2023-04-06 ENCOUNTER — Ambulatory Visit: Payer: Medicare Other | Admitting: Family Medicine

## 2023-04-06 VITALS — BP 165/74 | HR 77 | Temp 97.2°F | Ht 73.0 in | Wt 183.2 lb

## 2023-04-06 DIAGNOSIS — H6123 Impacted cerumen, bilateral: Secondary | ICD-10-CM | POA: Diagnosis not present

## 2023-04-06 NOTE — Progress Notes (Addendum)
 Subjective:  Patient ID: Wayne [name], male    DOB: 07-15-1940, 83 y.o.   MRN: [id number]  Patient Care Team: Severa Rock HERO, FNP as PCP - General (Family Medicine) Santo Stanly LABOR, MD as PCP - Cardiology (Cardiology) Gershon Donnice SAUNDERS, DPM as Consulting Physician (Podiatry) Jerilynn Lamarr HERO, NP as Nurse Practitioner (Cardiology)   Chief Complaint:  ear irrigation (2 week follow up )   HPI: Wayne [name] is a 83 y.o. male presenting on 04/06/2023 for ear irrigation (2 week follow up )   Discussed the use of AI scribe software for clinical note transcription with the patient, who gave verbal consent to proceed.  History of Present Illness   The patient presented for follow up of significant bilateral earwax impaction. They reported a successful home removal of some earwax with use of Debrox as discussed, which was described as substantial in quantity. The patient did not report any associated symptoms such as hearing loss, pain, or tinnitus. The patient expressed satisfaction with the improvement post earwax removal.          Relevant past medical, surgical, family, and social history reviewed and updated as indicated.  Allergies and medications reviewed and updated. Data reviewed: Chart in Epic.   Past Medical History:  Diagnosis Date   Anemia of chronic disease    Chronic HFrEF (heart failure with reduced ejection fraction) (HCC)    Chronic kidney disease, stage 3a (HCC)    Coronary artery disease    Dr. Tisa years ago. No details.   Diabetes mellitus with circulatory complication (HCC)    Diabetic foot infection (HCC)    Encephalopathy    Former tobacco use    Lactic acidosis 11/28/2021   Multifocal pneumonia 11/28/2021   PAD (peripheral artery disease) (HCC)    Peripheral neuropathy    SDH (subdural hematoma) (HCC)    Severe pulmonary hypertension (HCC)     Past Surgical History:  Procedure Laterality Date   ABDOMINAL AORTOGRAM W/LOWER  EXTREMITY Right 11/13/2021   Procedure: ABDOMINAL AORTOGRAM W/LOWER EXTREMITY;  Surgeon: Magda Debby SAILOR, MD;  Location: [facility];  Service: Cardiovascular;  Laterality: Right;   AMPUTATION Right 05/23/2022   Procedure: AMPUTATION RAY;  Surgeon: Malvin Marsa FALCON, DPM;  Location: [facility];  Service: Podiatry;  Laterality: Right;  Partial 5th ray amputation   AMPUTATION Right 08/26/2022   Procedure: MIDFOOT AMPUTATION RIGHT FOOT, BONE BIOPSY RIGHT FOOT, TENDO ACHILIES LENGTHENING;  Surgeon: Janit Thresa HERO, DPM;  Location: [facility];  Service: Podiatry;  Laterality: Right;   APPLICATION OF WOUND VAC Right 08/26/2022   Procedure: APPLICATION OF WOUND VAC;  Surgeon: Janit Thresa HERO, DPM;  Location: [facility];  Service: Podiatry;  Laterality: Right;   CORONARY ANGIOPLASTY WITH STENT PLACEMENT     IR FLUORO GUIDE CV LINE RIGHT  08/27/2022   IR REMOVAL TUN CV CATH W/O FL  10/08/2022   IR US  GUIDE VASC ACCESS RIGHT  08/27/2022   IRRIGATION AND DEBRIDEMENT FOOT Right 05/25/2022   Procedure: IRRIGATION AND DEBRIDEMENT FOOT;  Surgeon: Gershon Donnice SAUNDERS, DPM;  Location: [facility];  Service: Podiatry;  Laterality: Right;    Social History   Socioeconomic History   Marital status: Divorced    Spouse name: Not on file   Number of children: Not on file   Years of education: Not on file   Highest education level: Not on file  Occupational History   Not on file  Tobacco Use   Smoking status:  Former    Current packs/day: 0.00    Types: Cigarettes    Quit date: 1993    Years since quitting: 32.0   Smokeless tobacco: Never  Vaping Use   Vaping status: Never Used  Substance and Sexual Activity   Alcohol use: No   Drug use: No   Sexual activity: Not Currently  Other Topics Concern   Not on file  Social History Narrative   Not on file   Social Drivers of Health   Financial Resource Strain: Low Risk  (05/05/2022)   Overall Financial Resource Strain (CARDIA)    Difficulty of Paying Living Expenses: Not  hard at all  Food Insecurity: No Food Insecurity (05/22/2022)   Hunger Vital Sign    Worried About Running Out of Food in the Last Year: Never true    Ran Out of Food in the Last Year: Never true  Transportation Needs: No Transportation Needs (05/22/2022)   PRAPARE - Administrator, Civil Service (Medical): No    Lack of Transportation (Non-Medical): No  Physical Activity: Inactive (05/05/2022)   Exercise Vital Sign    Days of Exercise per Week: 0 days    Minutes of Exercise per Session: 0 min  Stress: No Stress Concern Present (05/05/2022)   [employer] of Occupational Health - Occupational Stress Questionnaire    Feeling of Stress : Not at all  Social Connections: Socially Isolated (05/05/2022)   Social Connection and Isolation Panel [NHANES]    Frequency of Communication with Friends and Family: More than three times a week    Frequency of Social Gatherings with Friends and Family: More than three times a week    Attends Religious Services: Never    Database Administrator or Organizations: No    Attends Banker Meetings: Never    Marital Status: Divorced  Catering Manager Violence: Not At Risk (05/22/2022)   Humiliation, Afraid, Rape, and Kick questionnaire    Fear of Current or Ex-Partner: No    Emotionally Abused: No    Physically Abused: No    Sexually Abused: No    Outpatient Encounter Medications as of 04/06/2023  Medication Sig   Accu-Chek Softclix Lancets lancets Use 4 times daily as directed to check blood sugars.   carbamide peroxide (DEBROX) 6.5 % OTIC solution Place 5 drops into both ears 2 (two) times daily.   furosemide  (LASIX ) 40 MG tablet TAKE 1 TABLET BY MOUTH EVERY DAY   glipiZIDE  (GLUCOTROL ) 5 MG tablet TAKE 1 TABLET BY MOUTH EVERY DAY BEFORE BREAKFAST   ketoconazole  (NIZORAL ) 2 % cream Apply 1 Application topically daily.   nystatin  cream (MYCOSTATIN ) Apply 1 Application topically 2 (two) times daily.   triamcinolone  cream (KENALOG ) 0.1  % Apply 1 Application topically 2 (two) times daily.   lisinopril  (ZESTRIL ) 5 MG tablet Take 1 tablet (5 mg total) by mouth daily. (Patient not taking: Reported on 04/06/2023)   metoprolol  succinate (TOPROL -XL) 25 MG 24 hr tablet TAKE 1/2 TABLET BY MOUTH DAILY (Patient not taking: Reported on 04/06/2023)   No facility-administered encounter medications on file as of 04/06/2023.    No Known Allergies  Pertinent ROS per HPI, otherwise unremarkable      Objective:  BP (!) 165/74   Pulse 77   Temp (!) 97.2 F (36.2 C)   Ht 6' 1 (1.854 m)   Wt 183 lb 3.2 oz (83.1 kg)   SpO2 95%   BMI 24.17 kg/m    Wt Readings from  Last 3 Encounters:  04/06/23 183 lb 3.2 oz (83.1 kg)  03/11/23 183 lb (83 kg)  12/10/22 183 lb 3.2 oz (83.1 kg)    Physical Exam Vitals and nursing note reviewed.  Constitutional:      Appearance: He is normal weight.  HENT:     Head: Normocephalic and atraumatic.     Right Ear: Tympanic membrane, ear canal and external ear normal. There is no impacted cerumen.     Left Ear: Tympanic membrane, ear canal and external ear normal. There is no impacted cerumen.     Nose: Nose normal.     Mouth/Throat:     Mouth: Mucous membranes are moist.  Eyes:     Conjunctiva/sclera: Conjunctivae normal.     Pupils: Pupils are equal, round, and reactive to light.  Cardiovascular:     Rate and Rhythm: Normal rate.  Pulmonary:     Effort: Pulmonary effort is normal.  Skin:    General: Skin is warm and dry.     Capillary Refill: Capillary refill takes less than 2 seconds.  Neurological:     General: No focal deficit present.     Mental Status: He is alert. Mental status is at baseline.  Psychiatric:        Mood and Affect: Mood normal.        Behavior: Behavior normal.    Physical Exam   HEENT: Ears clean, no obstruction. Previously impacted with cerumen bilaterally, now cleared.        Results for orders placed or performed in visit on 03/11/23  Microscopic Examination    Collection Time: 03/11/23  3:03 PM   Urine  Result Value Ref Range   WBC, UA 0-5 0 - 5 /hpf   RBC, Urine 0-2 0 - 2 /hpf   Epithelial Cells (non renal) 0-10 0 - 10 /hpf   Renal Epithel, UA None seen None seen /hpf   Casts Present (A) None seen /lpf   Cast Type Hyaline casts N/A   Bacteria, UA None seen None seen/Few   Yeast, UA None seen None seen  Urinalysis, Routine w reflex microscopic   Collection Time: 03/11/23  3:03 PM  Result Value Ref Range   Specific Gravity, UA 1.020 1.005 - 1.030   pH, UA 5.5 5.0 - 7.5   Color, UA Yellow Yellow   Appearance Ur Clear Clear   Leukocytes,UA Negative Negative   Protein,UA 1+ (A) Negative/Trace   Glucose, UA Negative Negative   Ketones, UA Negative Negative   RBC, UA Trace (A) Negative   Bilirubin, UA Negative Negative   Urobilinogen, Ur 0.2 0.2 - 1.0 mg/dL   Nitrite, UA Negative Negative   Microscopic Examination See below:   Urine Culture   Collection Time: 03/11/23  3:06 PM   Specimen: Urine   UR  Result Value Ref Range   Urine Culture, Routine Final report    Organism ID, Bacteria No growth   Bayer DCA Hb A1c Waived   Collection Time: 03/11/23  3:30 PM  Result Value Ref Range   HB A1C (BAYER DCA - WAIVED) 4.3 (L) 4.8 - 5.6 %  CBC with Differential/Platelet   Collection Time: 03/11/23  3:32 PM  Result Value Ref Range   WBC 5.8 3.4 - 10.8 x10E3/uL   RBC 4.82 4.14 - 5.80 x10E6/uL   Hemoglobin 14.7 13.0 - 17.7 g/dL   Hematocrit 57.5 62.4 - 51.0 %   MCV 88 79 - 97 fL   MCH 30.5 26.6 -  33.0 pg   MCHC 34.7 31.5 - 35.7 g/dL   RDW 87.5 88.3 - 84.5 %   Platelets 159 150 - 450 x10E3/uL   Neutrophils 44 Not Estab. %   Lymphs 39 Not Estab. %   Monocytes 9 Not Estab. %   Eos 6 Not Estab. %   Basos 1 Not Estab. %   Neutrophils Absolute 2.7 1.4 - 7.0 x10E3/uL   Lymphocytes Absolute 2.3 0.7 - 3.1 x10E3/uL   Monocytes Absolute 0.5 0.1 - 0.9 x10E3/uL   EOS (ABSOLUTE) 0.3 0.0 - 0.4 x10E3/uL   Basophils Absolute 0.0 0.0 - 0.2 x10E3/uL    Immature Granulocytes 1 Not Estab. %   Immature Grans (Abs) 0.0 0.0 - 0.1 x10E3/uL  CMP14+EGFR   Collection Time: 03/11/23  3:32 PM  Result Value Ref Range   Glucose 239 (H) 70 - 99 mg/dL   BUN 20 8 - 27 mg/dL   Creatinine, Ser 8.52 (H) 0.76 - 1.27 mg/dL   eGFR 47 (L) >40 fO/fpw/8.26   BUN/Creatinine Ratio 14 10 - 24   Sodium 137 134 - 144 mmol/L   Potassium 4.4 3.5 - 5.2 mmol/L   Chloride 101 96 - 106 mmol/L   CO2 21 20 - 29 mmol/L   Calcium  8.9 8.6 - 10.2 mg/dL   Total Protein 6.9 6.0 - 8.5 g/dL   Albumin 4.0 3.7 - 4.7 g/dL   Globulin, Total 2.9 1.5 - 4.5 g/dL   Bilirubin Total 0.5 0.0 - 1.2 mg/dL   Alkaline Phosphatase 122 (H) 44 - 121 IU/L   AST 21 0 - 40 IU/L   ALT 17 0 - 44 IU/L  Lipid panel   Collection Time: 03/11/23  3:32 PM  Result Value Ref Range   Cholesterol, Total 223 (H) 100 - 199 mg/dL   Triglycerides 652 (H) 0 - 149 mg/dL   HDL 27 (L) >60 mg/dL   VLDL Cholesterol Cal 63 (H) 5 - 40 mg/dL   LDL Chol Calc (NIH) 866 (H) 0 - 99 mg/dL   Chol/HDL Ratio 8.3 (H) 0.0 - 5.0 ratio  Thyroid  Panel With TSH   Collection Time: 03/11/23  3:32 PM  Result Value Ref Range   TSH 1.710 0.450 - 4.500 uIU/mL   T4, Total 8.1 4.5 - 12.0 ug/dL   T3 Uptake Ratio 30 24 - 39 %   Free Thyroxine Index 2.4 1.2 - 4.9       Pertinent labs & imaging results that were available during my care of the patient were reviewed by me and considered in my medical decision making.  Assessment & Plan:  Laverne Klugh was seen today for ear irrigation.  Diagnoses and all orders for this visit:  Bilateral impacted cerumen       Cerumen Impaction Cerumen impaction in both ears, previously packed with significant wax buildup. Ears have been cleaned successfully. Discussed the use of Debrox ear drops to maintain ear hygiene and prevent future impaction. No adverse effects anticipated with regular use. Patient expressed satisfaction with the current treatment plan. - Continue Debrox ear drops three  to four times a week to prevent wax buildup.          Continue all other maintenance medications.  Follow up plan: Return if symptoms worsen or fail to improve.   Continue healthy lifestyle choices, including diet (rich in fruits, vegetables, and lean proteins, and low in salt and simple carbohydrates) and exercise (at least 30 minutes of moderate physical activity daily).  The above assessment and management plan was discussed with the patient. The patient verbalized understanding of and has agreed to the management plan. Patient is aware to call the clinic if they develop any new symptoms or if symptoms persist or worsen. Patient is aware when to return to the clinic for a follow-up visit. Patient educated on when it is appropriate to go to the emergency department.   Rosaline Bruns, FNP-C [facility] [phone]

## 2023-04-19 ENCOUNTER — Ambulatory Visit: Payer: Medicare Other | Admitting: Family Medicine

## 2023-05-03 ENCOUNTER — Other Ambulatory Visit: Payer: Self-pay | Admitting: Family Medicine

## 2023-05-03 DIAGNOSIS — B372 Candidiasis of skin and nail: Secondary | ICD-10-CM

## 2023-05-03 MED ORDER — TRIAMCINOLONE ACETONIDE 0.1 % EX CREA: 1.0000 | TOPICAL_CREAM | Freq: Two times a day (BID) | CUTANEOUS | 3 refills | Status: DC

## 2023-05-03 MED ORDER — KETOCONAZOLE 2 % EX CREA: 1.0000 | TOPICAL_CREAM | Freq: Every day | CUTANEOUS | 3 refills | Status: DC

## 2023-05-11 ENCOUNTER — Ambulatory Visit: Payer: Medicare Other

## 2023-05-11 VITALS — Ht 73.0 in | Wt 183.0 lb

## 2023-05-11 DIAGNOSIS — Z0000 Encounter for general adult medical examination without abnormal findings: Secondary | ICD-10-CM | POA: Diagnosis not present

## 2023-05-11 NOTE — Progress Notes (Signed)
Subjective:   Wayne [name] is a 83 y.o. male who presents for Medicare Annual/Subsequent preventive examination.  Visit Complete: Virtual I connected with  Wayne [name] on 05/11/23 by a audio enabled telemedicine application and verified that I am speaking with the correct person using two identifiers.  Patient Location: Home  Provider Location: Home Office  This patient declined Interactive audio and video telecommunications. Therefore the visit was completed with audio only.  I discussed the limitations of evaluation and management by telemedicine. The patient expressed understanding and agreed to proceed.  Vital Signs: Because this visit was a virtual/telehealth visit, some criteria may be missing or patient reported. Any vitals not documented were not able to be obtained and vitals that have been documented are patient reported.  Cardiac Risk Factors include: advanced age (>32men, >6 women);diabetes mellitus;dyslipidemia;hypertension;sedentary lifestyle     Objective:    Today's Vitals   05/11/23 1406  Weight: 183 lb (83 kg)  Height: 6\' 1"  (1.854 m)   Body mass index is 24.14 kg/m.     05/11/2023    2:16 PM 10/06/2022   10:18 AM 09/28/2022    1:08 PM 05/22/2022    1:00 AM 05/20/2022    7:22 PM 05/05/2022    3:05 PM 11/28/2021    8:59 AM  Advanced Directives  Does Patient Have a Medical Advance Directive? Yes Yes Yes  No Yes No  Type of Estate agent of [place];Living will Out of facility DNR (pink MOST or yellow form) Out of facility DNR (pink MOST or yellow form)   Healthcare Power of [place];Living will   Does patient want to make changes to medical advance directive? No - Patient declined No - Patient declined No - Patient declined   No - Patient declined   Copy of Healthcare Power of Attorney in Chart? Yes - validated most recent copy scanned in chart (See row information)     Yes - validated most recent copy scanned in chart (See row  information)   Would patient like information on creating a medical advance directive?    No - Patient declined   No - Patient declined    Current Medications (verified) Outpatient Encounter Medications as of 05/11/2023  Medication Sig   Accu-Chek Softclix Lancets lancets Use 4 times daily as directed to check blood sugars.   carbamide peroxide (DEBROX) 6.5 % OTIC solution Place 5 drops into both ears 2 (two) times daily.   furosemide (LASIX) 40 MG tablet TAKE 1 TABLET BY MOUTH EVERY DAY   glipiZIDE (GLUCOTROL) 5 MG tablet TAKE 1 TABLET BY MOUTH EVERY DAY BEFORE BREAKFAST   ketoconazole (NIZORAL) 2 % cream Apply 1 Application topically daily.   nystatin cream (MYCOSTATIN) Apply 1 Application topically 2 (two) times daily.   triamcinolone cream (KENALOG) 0.1 % Apply 1 Application topically 2 (two) times daily.   lisinopril (ZESTRIL) 5 MG tablet Take 1 tablet (5 mg total) by mouth daily. (Patient not taking: Reported on 05/11/2023)   metoprolol succinate (TOPROL-XL) 25 MG 24 hr tablet TAKE 1/2 TABLET BY MOUTH DAILY (Patient not taking: Reported on 05/11/2023)   No facility-administered encounter medications on file as of 05/11/2023.    Allergies (verified) Patient has no known allergies.   History: Past Medical History:  Diagnosis Date   Anemia of chronic disease    Chronic HFrEF (heart failure with reduced ejection fraction) (HCC)    Chronic kidney disease, stage 3a (HCC)    Coronary artery disease    Dr.  Tennant years ago. No details.   Diabetes mellitus with circulatory complication (HCC)    Diabetic foot infection (HCC)    Encephalopathy    Former tobacco use    Lactic acidosis 11/28/2021   Multifocal pneumonia 11/28/2021   PAD (peripheral artery disease) (HCC)    Peripheral neuropathy    SDH (subdural hematoma) (HCC)    Severe pulmonary hypertension (HCC)    Past Surgical History:  Procedure Laterality Date   ABDOMINAL AORTOGRAM W/LOWER EXTREMITY Right 11/13/2021    Procedure: ABDOMINAL AORTOGRAM W/LOWER EXTREMITY;  Surgeon: Leonie Douglas, MD;  Location: [facility];  Service: Cardiovascular;  Laterality: Right;   AMPUTATION Right 05/23/2022   Procedure: AMPUTATION RAY;  Surgeon: Pilar Plate, DPM;  Location: [facility] OR;  Service: Podiatry;  Laterality: Right;  Partial 5th ray amputation   AMPUTATION Right 08/26/2022   Procedure: MIDFOOT AMPUTATION RIGHT FOOT, BONE BIOPSY RIGHT FOOT, TENDO ACHILIES LENGTHENING;  Surgeon: Felecia Shelling, DPM;  Location: [facility] OR;  Service: Podiatry;  Laterality: Right;   APPLICATION OF WOUND VAC Right 08/26/2022   Procedure: APPLICATION OF WOUND VAC;  Surgeon: Felecia Shelling, DPM;  Location: [facility];  Service: Podiatry;  Laterality: Right;   CORONARY ANGIOPLASTY WITH STENT PLACEMENT     IR FLUORO GUIDE CV LINE RIGHT  08/27/2022   IR REMOVAL TUN CV CATH W/O FL  10/08/2022   IR US GUIDE VASC ACCESS RIGHT  08/27/2022   IRRIGATION AND DEBRIDEMENT FOOT Right 05/25/2022   Procedure: IRRIGATION AND DEBRIDEMENT FOOT;  Surgeon: Vivi Barrack, DPM;  Location: [facility];  Service: Podiatry;  Laterality: Right;   Family History  Problem Relation Age of Onset   Cervical cancer Mother    Down syndrome Sister    Diabetes type II Neg Hx    Social History   Socioeconomic History   Marital status: Divorced    Spouse name: Not on file   Number of children: Not on file   Years of education: Not on file   Highest education level: Not on file  Occupational History   Not on file  Tobacco Use   Smoking status: Former    Current packs/day: 0.00    Types: Cigarettes    Quit date: 57    Years since quitting: 32.1   Smokeless tobacco: Never  Vaping Use   Vaping status: Never Used  Substance and Sexual Activity   Alcohol use: No   Drug use: No   Sexual activity: Not Currently  Other Topics Concern   Not on file  Social History Narrative   Not on file   Social Drivers of Health   Financial Resource Strain: Low Risk   (05/11/2023)   Overall Financial Resource Strain (CARDIA)    Difficulty of Paying Living Expenses: Not hard at all  Food Insecurity: No Food Insecurity (05/11/2023)   Hunger Vital Sign    Worried About Running Out of Food in the Last Year: Never true    Ran Out of Food in the Last Year: Never true  Transportation Needs: No Transportation Needs (05/11/2023)   PRAPARE - Administrator, Civil Service (Medical): No    Lack of Transportation (Non-Medical): No  Physical Activity: Inactive (05/11/2023)   Exercise Vital Sign    Days of Exercise per Week: 0 days    Minutes of Exercise per Session: 0 min  Stress: No Stress Concern Present (05/11/2023)   [employer] of Occupational Health - Occupational Stress Questionnaire  Feeling of Stress : Not at all  Social Connections: Socially Isolated (05/11/2023)   Social Connection and Isolation Panel [NHANES]    Frequency of Communication with Friends and Family: More than three times a week    Frequency of Social Gatherings with Friends and Family: More than three times a week    Attends Religious Services: Never    Database administrator or Organizations: No    Attends Engineer, structural: Never    Marital Status: Divorced    Tobacco Counseling Counseling given: Not Answered   Clinical Intake:  Pre-visit preparation completed: Yes  Pain : No/denies pain     Diabetes: Yes CBG done?: No Did pt. bring in CBG monitor from home?: No  How often do you need to have someone help you when you read instructions, pamphlets, or other written materials from your doctor or pharmacy?: 1 - Never  Interpreter Needed?: No  Comments: Assisted with visit by daughter-Lynn Information entered by :: Kandis Fantasia LPN   Activities of Daily Living    05/11/2023    2:10 PM 05/22/2022    1:00 AM  In your present state of health, do you have any difficulty performing the following activities:  Hearing? 0 1  Vision? 0 0   Difficulty concentrating or making decisions? 0 1  Walking or climbing stairs? 1 1  Dressing or bathing? 0 1  Doing errands, shopping? 1 0  Preparing Food and eating ? N   Using the Toilet? N   In the past six months, have you accidently leaked urine? N   Do you have problems with loss of bowel control? N   Managing your Medications? N   Managing your Finances? N   Housekeeping or managing your Housekeeping? N     Patient Care Team: Sonny Masters, FNP as PCP - General (Family Medicine) Christell Constant, MD as PCP - Cardiology (Cardiology) Vivi Barrack, DPM as Consulting Physician (Podiatry) Jodelle Gross, NP as Nurse Practitioner (Cardiology) Janet Berlin, MD as Consulting Physician (Ophthalmology)  Indicate any recent Medical Services you may have received from other than [facility] providers in the past year (date may be approximate).     Assessment:   This is a routine wellness examination for Gaspar Garbe.  Hearing/Vision screen Hearing Screening - Comments:: Hard of hearing; no hearing aids   Vision Screening - Comments:: Wears rx glasses - up to date with routine eye exams with Dr. Burgess Estelle     Goals Addressed   None   Depression Screen    05/11/2023    2:11 PM 12/10/2022    2:39 PM 10/12/2022    9:56 AM 08/19/2022    2:10 PM 06/22/2022   10:26 AM 05/05/2022    3:04 PM 02/16/2022    2:55 PM  PHQ 2/9 Scores  PHQ - 2 Score 0 0 0 0 0 0 0  PHQ- 9 Score  0 0 0   0    Fall Risk    05/11/2023    2:17 PM 12/10/2022    2:39 PM 10/12/2022    9:55 AM 08/19/2022    2:10 PM 06/22/2022   10:25 AM  Fall Risk   Falls in the past year? 0 0 0 0 0  Number falls in past yr: 0    0  Injury with Fall? 0    0  Risk for fall due to : No Fall Risks    No Fall Risks  Follow up Falls  prevention discussed;Education provided;Falls evaluation completed  Falls evaluation completed  Falls evaluation completed    MEDICARE RISK AT HOME: Medicare Risk at Home Any stairs in or  around the home?: No If so, are there any without handrails?: No Home free of loose throw rugs in walkways, pet beds, electrical cords, etc?: Yes Adequate lighting in your home to reduce risk of falls?: Yes Life alert?: No Use of a cane, walker or w/c?: No Grab bars in the bathroom?: Yes Shower chair or bench in shower?: No Elevated toilet seat or a handicapped toilet?: Yes  TIMED UP AND GO:  Was the test performed?  No    Cognitive Function:        05/11/2023    2:17 PM 05/05/2022    3:06 PM  6CIT Screen  What Year? 0 points 0 points  What month? 0 points 0 points  What time? 0 points 0 points  Count back from 20 0 points 0 points  Months in reverse 2 points 0 points  Repeat phrase 2 points 2 points  Total Score 4 points 2 points    Immunizations Immunization History  Administered Date(s) Administered   Pneumococcal Conjugate-13 07/24/2014   Td 03/11/2023    TDAP status: Up to date  Flu Vaccine status: Declined, Education has been provided regarding the importance of this vaccine but patient still declined. Advised may receive this vaccine at local pharmacy or Health Dept. Aware to provide a copy of the vaccination record if obtained from local pharmacy or Health Dept. Verbalized acceptance and understanding.  Pneumococcal vaccine status: Declined,  Education has been provided regarding the importance of this vaccine but patient still declined. Advised may receive this vaccine at local pharmacy or Health Dept. Aware to provide a copy of the vaccination record if obtained from local pharmacy or Health Dept. Verbalized acceptance and understanding.   Covid-19 vaccine status: Declined, Education has been provided regarding the importance of this vaccine but patient still declined. Advised may receive this vaccine at local pharmacy or Health Dept.or vaccine clinic. Aware to provide a copy of the vaccination record if obtained from local pharmacy or Health Dept. Verbalized  acceptance and understanding.  Qualifies for Shingles Vaccine? Yes   Zostavax completed No   Shingrix Completed?: No.    Education has been provided regarding the importance of this vaccine. Patient has been advised to call insurance company to determine out of pocket expense if they have not yet received this vaccine. Advised may also receive vaccine at local pharmacy or Health Dept. Verbalized acceptance and understanding.  Screening Tests Health Maintenance  Topic Date Due   FOOT EXAM  Never done   OPHTHALMOLOGY EXAM  Never done   Diabetic kidney evaluation - Urine ACR  Never done   Zoster Vaccines- Shingrix (1 of 2) Never done   COVID-19 Vaccine (1 - 2024-25 season) Never done   INFLUENZA VACCINE  06/27/2023 (Originally 10/28/2022)   Pneumonia Vaccine 2+ Years old (2 of 2 - PPSV23 or PCV20) 12/10/2023 (Originally 09/18/2014)   HEMOGLOBIN A1C  09/09/2023   Diabetic kidney evaluation - eGFR measurement  03/10/2024   Medicare Annual Wellness (AWV)  05/10/2024   DTaP/Tdap/Td (2 - Tdap) 03/10/2033   HPV VACCINES  Aged Out    Health Maintenance  Health Maintenance Due  Topic Date Due   FOOT EXAM  Never done   OPHTHALMOLOGY EXAM  Never done   Diabetic kidney evaluation - Urine ACR  Never done   Zoster Vaccines- Shingrix (  1 of 2) Never done   COVID-19 Vaccine (1 - 2024-25 season) Never done    Colorectal cancer screening: No longer required.   Lung Cancer Screening: (Low Dose CT Chest recommended if Age 10-80 years, 20 pack-year currently smoking OR have quit w/in 15years.) does not qualify.   Lung Cancer Screening Referral: n/a  Additional Screening:  Hepatitis C Screening: does not qualify  Vision Screening: Recommended annual ophthalmology exams for early detection of glaucoma and other disorders of the eye. Is the patient up to date with their annual eye exam?  Yes  Who is the provider or what is the name of the office in which the patient attends annual eye exams? Dr.  Burgess Estelle  If pt is not established with a provider, would they like to be referred to a provider to establish care? No .   Dental Screening: Recommended annual dental exams for proper oral hygiene  Diabetic Foot Exam: Diabetic Foot Exam: Overdue, Pt has been advised about the importance in completing this exam. Pt is scheduled for diabetic foot exam on at next office visit.  Community Resource Referral / Chronic Care Management: CRR required this visit?  No   CCM required this visit?  No     Plan:     I have personally reviewed and noted the following in the patient's chart:   Medical and social history Use of alcohol, tobacco or illicit drugs  Current medications and supplements including opioid prescriptions. Patient is not currently taking opioid prescriptions. Functional ability and status Nutritional status Physical activity Advanced directives List of other physicians Hospitalizations, surgeries, and ER visits in previous 12 months Vitals Screenings to include cognitive, depression, and falls Referrals and appointments  In addition, I have reviewed and discussed with patient certain preventive protocols, quality metrics, and best practice recommendations. A written personalized care plan for preventive services as well as general preventive health recommendations were provided to patient.     Kandis Fantasia [place], [place]   07/05/8117   After Visit Summary: (MyChart) Due to this being a telephonic visit, the after visit summary with patients personalized plan was offered to patient via MyChart   Nurse Notes: No concerns at this time

## 2023-05-11 NOTE — Patient Instructions (Signed)
Mr. Speegle , Thank you for taking time to come for your Medicare Wellness Visit. I appreciate your ongoing commitment to your health goals. Please review the following plan we discussed and let me know if I can assist you in the future.   Referrals/Orders/Follow-Ups/Clinician Recommendations: Aim for 30 minutes of exercise or brisk walking, 6-8 glasses of water, and 5 servings of fruits and vegetables each day.  This is a list of the screening recommended for you and due dates:  Health Maintenance  Topic Date Due   Complete foot exam   Never done   Eye exam for diabetics  Never done   Yearly kidney health urinalysis for diabetes  Never done   Zoster (Shingles) Vaccine (1 of 2) Never done   COVID-19 Vaccine (1 - 2024-25 season) Never done   Flu Shot  06/27/2023*   Pneumonia Vaccine (2 of 2 - PPSV23 or PCV20) 12/10/2023*   Hemoglobin A1C  09/09/2023   Yearly kidney function blood test for diabetes  03/10/2024   Medicare Annual Wellness Visit  05/10/2024   DTaP/Tdap/Td vaccine (2 - Tdap) 03/10/2033   HPV Vaccine  Aged Out  *Topic was postponed. The date shown is not the original due date.    Advanced directives: (ACP Link)Information on Advanced Care Planning can be found at [facility] Advance Health Care Directives Advance Health Care Directives ([email or web address]   Next Medicare Annual Wellness Visit scheduled for next year: Yes

## 2023-06-09 ENCOUNTER — Ambulatory Visit (INDEPENDENT_AMBULATORY_CARE_PROVIDER_SITE_OTHER): Payer: Medicare Other | Admitting: Family Medicine

## 2023-06-09 ENCOUNTER — Encounter: Payer: Self-pay | Admitting: Family Medicine

## 2023-06-09 VITALS — BP 148/67 | HR 72 | Temp 97.3°F | Ht 73.0 in | Wt 178.8 lb

## 2023-06-09 DIAGNOSIS — N183 Type 2 diabetes mellitus with diabetic chronic kidney disease: Secondary | ICD-10-CM

## 2023-06-09 DIAGNOSIS — B372 Candidiasis of skin and nail: Secondary | ICD-10-CM | POA: Diagnosis not present

## 2023-06-09 DIAGNOSIS — E785 Hyperlipidemia, unspecified: Secondary | ICD-10-CM | POA: Diagnosis not present

## 2023-06-09 DIAGNOSIS — E1122 Type 2 diabetes mellitus with diabetic chronic kidney disease: Secondary | ICD-10-CM

## 2023-06-09 DIAGNOSIS — K625 Hemorrhage of anus and rectum: Secondary | ICD-10-CM | POA: Diagnosis not present

## 2023-06-09 DIAGNOSIS — I5043 Acute on chronic combined systolic (congestive) and diastolic (congestive) heart failure: Secondary | ICD-10-CM | POA: Diagnosis not present

## 2023-06-09 DIAGNOSIS — E1169 Type 2 diabetes mellitus with other specified complication: Secondary | ICD-10-CM | POA: Diagnosis not present

## 2023-06-09 DIAGNOSIS — I129 Hypertensive chronic kidney disease with stage 1 through stage 4 chronic kidney disease, or unspecified chronic kidney disease: Secondary | ICD-10-CM | POA: Diagnosis not present

## 2023-06-09 DIAGNOSIS — E119 Type 2 diabetes mellitus without complications: Secondary | ICD-10-CM | POA: Insufficient documentation

## 2023-06-09 LAB — BAYER DCA HB A1C WAIVED: HB A1C (BAYER DCA - WAIVED): 7.5 % — ABNORMAL HIGH (ref 4.8–5.6)

## 2023-06-09 MED ORDER — TRIAMCINOLONE ACETONIDE 0.1 % EX CREA: 1.0000 | TOPICAL_CREAM | Freq: Two times a day (BID) | CUTANEOUS | 3 refills | Status: DC

## 2023-06-09 MED ORDER — NYSTATIN 100000 UNIT/GM EX CREA: 1.0000 | TOPICAL_CREAM | Freq: Two times a day (BID) | CUTANEOUS | 0 refills | Status: DC

## 2023-06-09 MED ORDER — FLUCONAZOLE 150 MG PO TABS: ORAL_TABLET | ORAL | 0 refills | Status: DC

## 2023-06-09 MED ORDER — FUROSEMIDE 40 MG PO TABS: 40.0000 mg | ORAL_TABLET | Freq: Every day | ORAL | 1 refills | Status: DC

## 2023-06-09 MED ORDER — GLIPIZIDE 5 MG PO TABS: 5.0000 mg | ORAL_TABLET | Freq: Every day | ORAL | 1 refills | Status: DC

## 2023-06-09 NOTE — Progress Notes (Signed)
 Subjective:  Patient ID: Wayne [name], male    DOB: 04/08/1940, 83 y.o.   MRN: [id number]  Patient Care Team: Sonny Masters, FNP as PCP - General (Family Medicine) Christell Constant, MD as PCP - Cardiology (Cardiology) Vivi Barrack, DPM as Consulting Physician (Podiatry) Jodelle Gross, NP as Nurse Practitioner (Cardiology) Janet Berlin, MD as Consulting Physician (Ophthalmology)   Chief Complaint:  Diabetes (3 month follow up ), groin itching  (Ongoing ), and Diarrhea (On and off and now is constipated.  States he has also had episodes of having blood in his bed from his bottom  )   HPI: Wayne [name] is a 83 y.o. male presenting on 06/09/2023 for Diabetes (3 month follow up ), groin itching  (Ongoing ), and Diarrhea (On and off and now is constipated.  States he has also had episodes of having blood in his bed from his bottom  )   Discussed the use of AI scribe software for clinical note transcription with the patient, who gave verbal consent to proceed.  History of Present Illness   The patient presents with diarrhea and itching. He is accompanied by Micah Noel.  He has been experiencing diarrhea for the past couple of weeks, with episodes occurring daily. He takes Kaopectate approximately once a day, but not after every loose stool. He has not tried fiber supplements. Blood has been noticed on his underwear, which he suspects may be due to hemorrhoids. No pain during bowel movements is reported. Blood is bright red and only noted when wiping and on underwear.  He has been experiencing itching, particularly at night, for about a month. There is a history of similar issues following the use of strong intravenous antibiotics. He has used nystatin cream in the past for this condition. A beefy red rash is present in the groin area, but no bleeding is reported.  He is currently taking glipizide 5 mg for diabetes management. His A1c is 7.5, which is within the  target range for his age. There have been no changes in his medication since the last visit.  His blood pressure is reported to be well-controlled at home. He mentions that his feet swell a little and that he cannot feel them well, which has been a persistent issue.          Relevant past medical, surgical, family, and social history reviewed and updated as indicated.  Allergies and medications reviewed and updated. Data reviewed: Chart in Epic.   Past Medical History:  Diagnosis Date   Anemia of chronic disease    Chronic HFrEF (heart failure with reduced ejection fraction) (HCC)    Chronic kidney disease, stage 3a (HCC)    Coronary artery disease    Dr. Deborah Chalk years ago. No details.   Diabetes mellitus with circulatory complication (HCC)    Diabetic foot infection (HCC)    Encephalopathy    Former tobacco use    Lactic acidosis 11/28/2021   Multifocal pneumonia 11/28/2021   PAD (peripheral artery disease) (HCC)    Peripheral neuropathy    SDH (subdural hematoma) (HCC)    Severe pulmonary hypertension (HCC)     Past Surgical History:  Procedure Laterality Date   ABDOMINAL AORTOGRAM W/LOWER EXTREMITY Right 11/13/2021   Procedure: ABDOMINAL AORTOGRAM W/LOWER EXTREMITY;  Surgeon: Leonie Douglas, MD;  Location: [facility];  Service: Cardiovascular;  Laterality: Right;   AMPUTATION Right 05/23/2022   Procedure: AMPUTATION RAY;  Surgeon: Pilar Plate, DPM;  Location: [facility];  Service: Podiatry;  Laterality: Right;  Partial 5th ray amputation   AMPUTATION Right 08/26/2022   Procedure: MIDFOOT AMPUTATION RIGHT FOOT, BONE BIOPSY RIGHT FOOT, TENDO ACHILIES LENGTHENING;  Surgeon: Felecia Shelling, DPM;  Location: [facility];  Service: Podiatry;  Laterality: Right;   APPLICATION OF WOUND VAC Right 08/26/2022   Procedure: APPLICATION OF WOUND VAC;  Surgeon: Felecia Shelling, DPM;  Location: [facility];  Service: Podiatry;  Laterality: Right;   CORONARY ANGIOPLASTY WITH STENT PLACEMENT      IR FLUORO GUIDE CV LINE RIGHT  08/27/2022   IR REMOVAL TUN CV CATH W/O FL  10/08/2022   IR US GUIDE VASC ACCESS RIGHT  08/27/2022   IRRIGATION AND DEBRIDEMENT FOOT Right 05/25/2022   Procedure: IRRIGATION AND DEBRIDEMENT FOOT;  Surgeon: Vivi Barrack, DPM;  Location: [facility];  Service: Podiatry;  Laterality: Right;    Social History   Socioeconomic History   Marital status: Divorced    Spouse name: Not on file   Number of children: Not on file   Years of education: Not on file   Highest education level: Not on file  Occupational History   Not on file  Tobacco Use   Smoking status: Former    Current packs/day: 0.00    Types: Cigarettes    Quit date: 16    Years since quitting: 32.2   Smokeless tobacco: Never  Vaping Use   Vaping status: Never Used  Substance and Sexual Activity   Alcohol use: No   Drug use: No   Sexual activity: Not Currently  Other Topics Concern   Not on file  Social History Narrative   Not on file   Social Drivers of Health   Financial Resource Strain: Low Risk  (05/11/2023)   Overall Financial Resource Strain (CARDIA)    Difficulty of Paying Living Expenses: Not hard at all  Food Insecurity: No Food Insecurity (05/11/2023)   Hunger Vital Sign    Worried About Running Out of Food in the Last Year: Never true    Ran Out of Food in the Last Year: Never true  Transportation Needs: No Transportation Needs (05/11/2023)   PRAPARE - Administrator, Civil Service (Medical): No    Lack of Transportation (Non-Medical): No  Physical Activity: Inactive (05/11/2023)   Exercise Vital Sign    Days of Exercise per Week: 0 days    Minutes of Exercise per Session: 0 min  Stress: No Stress Concern Present (05/11/2023)   [employer] of Occupational Health - Occupational Stress Questionnaire    Feeling of Stress : Not at all  Social Connections: Socially Isolated (05/11/2023)   Social Connection and Isolation Panel [NHANES]    Frequency of  Communication with Friends and Family: More than three times a week    Frequency of Social Gatherings with Friends and Family: More than three times a week    Attends Religious Services: Never    Database administrator or Organizations: No    Attends Banker Meetings: Never    Marital Status: Divorced  Catering manager Violence: Not At Risk (05/11/2023)   Humiliation, Afraid, Rape, and Kick questionnaire    Fear of Current or Ex-Partner: No    Emotionally Abused: No    Physically Abused: No    Sexually Abused: No    Outpatient Encounter Medications as of 06/09/2023  Medication Sig   Accu-Chek Softclix Lancets lancets Use 4 times daily as directed to check  blood sugars.   carbamide peroxide (DEBROX) 6.5 % OTIC solution Place 5 drops into both ears 2 (two) times daily.   fluconazole (DIFLUCAN) 150 MG tablet 1 po q week x 4 weeks   [DISCONTINUED] furosemide (LASIX) 40 MG tablet TAKE 1 TABLET BY MOUTH EVERY DAY   [DISCONTINUED] glipiZIDE (GLUCOTROL) 5 MG tablet TAKE 1 TABLET BY MOUTH EVERY DAY BEFORE BREAKFAST   [DISCONTINUED] ketoconazole (NIZORAL) 2 % cream Apply 1 Application topically daily.   [DISCONTINUED] nystatin cream (MYCOSTATIN) Apply 1 Application topically 2 (two) times daily.   [DISCONTINUED] triamcinolone cream (KENALOG) 0.1 % Apply 1 Application topically 2 (two) times daily.   furosemide (LASIX) 40 MG tablet Take 1 tablet (40 mg total) by mouth daily.   glipiZIDE (GLUCOTROL) 5 MG tablet Take 1 tablet (5 mg total) by mouth daily before breakfast.   nystatin cream (MYCOSTATIN) Apply 1 Application topically 2 (two) times daily.   triamcinolone cream (KENALOG) 0.1 % Apply 1 Application topically 2 (two) times daily.   [DISCONTINUED] lisinopril (ZESTRIL) 5 MG tablet Take 1 tablet (5 mg total) by mouth daily. (Patient not taking: Reported on 04/06/2023)   [DISCONTINUED] metoprolol succinate (TOPROL-XL) 25 MG 24 hr tablet TAKE 1/2 TABLET BY MOUTH DAILY (Patient not taking:  Reported on 04/06/2023)   No facility-administered encounter medications on file as of 06/09/2023.    No Known Allergies  Pertinent ROS per HPI, otherwise unremarkable      Objective:  BP (!) 148/67   Pulse 72   Temp (!) 97.3 F (36.3 C)   Ht 6\' 1"  (1.854 m)   Wt 178 lb 12.8 oz (81.1 kg)   SpO2 96%   BMI 23.59 kg/m    Wt Readings from Last 3 Encounters:  06/09/23 178 lb 12.8 oz (81.1 kg)  05/11/23 183 lb (83 kg)  04/06/23 183 lb 3.2 oz (83.1 kg)    Physical Exam Vitals and nursing note reviewed.  Constitutional:      Appearance: He is ill-appearing (chronically ill).  HENT:     Head: Normocephalic and atraumatic.     Nose: Nose normal.     Mouth/Throat:     Mouth: Mucous membranes are moist.     Pharynx: Oropharynx is clear.  Eyes:     Conjunctiva/sclera: Conjunctivae normal.     Pupils: Pupils are equal, round, and reactive to light.  Cardiovascular:     Rate and Rhythm: Normal rate and regular rhythm.     Pulses:          Dorsalis pedis pulses are 2+ on the right side and 2+ on the left side.       Posterior tibial pulses are 2+ on the right side and 2+ on the left side.     Heart sounds: Normal heart sounds.  Pulmonary:     Effort: Pulmonary effort is normal.     Breath sounds: Normal breath sounds.  Musculoskeletal:     Cervical back: Neck supple.     Right lower leg: Edema present.     Left lower leg: Edema present.  Feet:     Right foot:     Protective Sensation: 10 sites tested.  0 sites sensed.     Skin integrity: Skin integrity normal.     Left foot:     Protective Sensation: 10 sites tested.  0 sites sensed.     Skin integrity: Skin integrity normal.     Comments: Partial amputation of right foot Skin:    General: Skin  is warm and dry.     Capillary Refill: Capillary refill takes less than 2 seconds.     Findings: Rash (groin) present.  Neurological:     General: No focal deficit present.     Mental Status: He is alert. Mental status is at  baseline.     Gait: Gait abnormal (using rolling walker).  Psychiatric:        Mood and Affect: Mood normal.        Behavior: Behavior normal.      Results for orders placed or performed in visit on 03/11/23  Microscopic Examination   Collection Time: 03/11/23  3:03 PM   Urine  Result Value Ref Range   WBC, UA 0-5 0 - 5 /hpf   RBC, Urine 0-2 0 - 2 /hpf   Epithelial Cells (non renal) 0-10 0 - 10 /hpf   Renal Epithel, UA None seen None seen /hpf   Casts Present (A) None seen /lpf   Cast Type Hyaline casts N/A   Bacteria, UA None seen None seen/Few   Yeast, UA None seen None seen  Urinalysis, Routine w reflex microscopic   Collection Time: 03/11/23  3:03 PM  Result Value Ref Range   Specific Gravity, UA 1.020 1.005 - 1.030   pH, UA 5.5 5.0 - 7.5   Color, UA Yellow Yellow   Appearance Ur Clear Clear   Leukocytes,UA Negative Negative   Protein,UA 1+ (A) Negative/Trace   Glucose, UA Negative Negative   Ketones, UA Negative Negative   RBC, UA Trace (A) Negative   Bilirubin, UA Negative Negative   Urobilinogen, Ur 0.2 0.2 - 1.0 mg/dL   Nitrite, UA Negative Negative   Microscopic Examination See below:   Urine Culture   Collection Time: 03/11/23  3:06 PM   Specimen: Urine   UR  Result Value Ref Range   Urine Culture, Routine Final report    Organism ID, Bacteria No growth   Bayer DCA Hb A1c Waived   Collection Time: 03/11/23  3:30 PM  Result Value Ref Range   HB A1C (BAYER DCA - WAIVED) 4.3 (L) 4.8 - 5.6 %  CBC with Differential/Platelet   Collection Time: 03/11/23  3:32 PM  Result Value Ref Range   WBC 5.8 3.4 - 10.8 x10E3/uL   RBC 4.82 4.14 - 5.80 x10E6/uL   Hemoglobin 14.7 13.0 - 17.7 g/dL   Hematocrit 11.9 14.7 - 51.0 %   MCV 88 79 - 97 fL   MCH 30.5 26.6 - 33.0 pg   MCHC 34.7 31.5 - 35.7 g/dL   RDW 82.9 56.2 - 13.0 %   Platelets 159 150 - 450 x10E3/uL   Neutrophils 44 Not Estab. %   Lymphs 39 Not Estab. %   Monocytes 9 Not Estab. %   Eos 6 Not Estab. %    Basos 1 Not Estab. %   Neutrophils Absolute 2.7 1.4 - 7.0 x10E3/uL   Lymphocytes Absolute 2.3 0.7 - 3.1 x10E3/uL   Monocytes Absolute 0.5 0.1 - 0.9 x10E3/uL   EOS (ABSOLUTE) 0.3 0.0 - 0.4 x10E3/uL   Basophils Absolute 0.0 0.0 - 0.2 x10E3/uL   Immature Granulocytes 1 Not Estab. %   Immature Grans (Abs) 0.0 0.0 - 0.1 x10E3/uL  CMP14+EGFR   Collection Time: 03/11/23  3:32 PM  Result Value Ref Range   Glucose 239 (H) 70 - 99 mg/dL   BUN 20 8 - 27 mg/dL   Creatinine, Ser 8.65 (H) 0.76 - 1.27 mg/dL   eGFR  47 (L) >59 mL/min/1.73   BUN/Creatinine Ratio 14 10 - 24   Sodium 137 134 - 144 mmol/L   Potassium 4.4 3.5 - 5.2 mmol/L   Chloride 101 96 - 106 mmol/L   CO2 21 20 - 29 mmol/L   Calcium 8.9 8.6 - 10.2 mg/dL   Total Protein 6.9 6.0 - 8.5 g/dL   Albumin 4.0 3.7 - 4.7 g/dL   Globulin, Total 2.9 1.5 - 4.5 g/dL   Bilirubin Total 0.5 0.0 - 1.2 mg/dL   Alkaline Phosphatase 122 (H) 44 - 121 IU/L   AST 21 0 - 40 IU/L   ALT 17 0 - 44 IU/L  Lipid panel   Collection Time: 03/11/23  3:32 PM  Result Value Ref Range   Cholesterol, Total 223 (H) 100 - 199 mg/dL   Triglycerides 161 (H) 0 - 149 mg/dL   HDL 27 (L) >09 mg/dL   VLDL Cholesterol Cal 63 (H) 5 - 40 mg/dL   LDL Chol Calc (NIH) 604 (H) 0 - 99 mg/dL   Chol/HDL Ratio 8.3 (H) 0.0 - 5.0 ratio  Thyroid Panel With TSH   Collection Time: 03/11/23  3:32 PM  Result Value Ref Range   TSH 1.710 0.450 - 4.500 uIU/mL   T4, Total 8.1 4.5 - 12.0 ug/dL   T3 Uptake Ratio 30 24 - 39 %   Free Thyroxine Index 2.4 1.2 - 4.9       Pertinent labs & imaging results that were available during my care of the patient were reviewed by me and considered in my medical decision making.  Assessment & Plan:  Wayne "Al" was seen today for diabetes, groin itching  and diarrhea.  Diagnoses and all orders for this visit:  Type 2 diabetes mellitus with other specified complication, without long-term current use of insulin (HCC) -     Bayer DCA Hb A1c Waived -      Microalbumin / creatinine urine ratio -     glipiZIDE (GLUCOTROL) 5 MG tablet; Take 1 tablet (5 mg total) by mouth daily before breakfast. -     Anemia Profile B  Hyperlipidemia associated with type 2 diabetes mellitus (HCC) -     Lipid panel -     Anemia Profile B  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) -     CMP14+EGFR -     furosemide (LASIX) 40 MG tablet; Take 1 tablet (40 mg total) by mouth daily. -     Anemia Profile B  Acute on chronic combined systolic and diastolic CHF (congestive heart failure) (HCC) -     furosemide (LASIX) 40 MG tablet; Take 1 tablet (40 mg total) by mouth daily.  Yeast dermatitis -     nystatin cream (MYCOSTATIN); Apply 1 Application topically 2 (two) times daily. -     triamcinolone cream (KENALOG) 0.1 %; Apply 1 Application topically 2 (two) times daily. -     fluconazole (DIFLUCAN) 150 MG tablet; 1 po q week x 4 weeks  CKD stage 3 due to type 2 diabetes mellitus (HCC) -     Anemia Profile B  Bright red blood per rectum -     Anemia Profile B -     Fecal occult blood, imunochemical; Future     Assessment and Plan    Diarrhea Experiencing diarrhea for a couple of weeks, using Kaopectate as needed. Possibility of hemorrhoids due to blood in the stool, likely from wiping or  staining. - Trial Metamucil or Benefiber to bulk up stool - Monitor for changes in bleeding pattern - Report if constipation, diarrhea, or bleeding becomes more prominent  Candidiasis Red, itchy rash in the groin area, likely due to candidiasis, exacerbated by previous antibiotic use. Rash present for about a month. - Prescribe Diflucan (fluconazole) 4-dose pack, one pill weekly for four weeks - Refill nystatin and triamcinolone cream for topical treatment - Advise frequent changing and keeping the area dry  Type 2 Diabetes Mellitus A1c is 7.5, within the target range of 6-8 for his age. Currently on glipizide 5 mg. - Continue  glipizide 5 mg  Peripheral Neuropathy Peripheral neuropathy with an inability to feel sensations in his feet. Some swelling noted. - Monitor for any new symptoms or changes in sensation  Hypertension Blood pressure is well-controlled at home. Currently on furosemide for fluid management. - Continue furosemide          Continue all other maintenance medications.  Follow up plan: Return if symptoms worsen or fail to improve.   Continue healthy lifestyle choices, including diet (rich in fruits, vegetables, and lean proteins, and low in salt and simple carbohydrates) and exercise (at least 30 minutes of moderate physical activity daily).    The above assessment and management plan was discussed with the patient. The patient verbalized understanding of and has agreed to the management plan. Patient is aware to call the clinic if they develop any new symptoms or if symptoms persist or worsen. Patient is aware when to return to the clinic for a follow-up visit. Patient educated on when it is appropriate to go to the emergency department.   Kari Baars, FNP-C [facility] [phone]

## 2023-06-10 LAB — CMP14+EGFR
ALT: 7 IU/L (ref 0–44)
AST: 13 IU/L (ref 0–40)
Albumin: 3.9 g/dL (ref 3.7–4.7)
Alkaline Phosphatase: 104 IU/L (ref 44–121)
BUN/Creatinine Ratio: 9 — ABNORMAL LOW (ref 10–24)
BUN: 13 mg/dL (ref 8–27)
Bilirubin Total: 0.7 mg/dL (ref 0.0–1.2)
CO2: 27 mmol/L (ref 20–29)
Calcium: 8.6 mg/dL (ref 8.6–10.2)
Chloride: 101 mmol/L (ref 96–106)
Creatinine, Ser: 1.38 mg/dL — ABNORMAL HIGH (ref 0.76–1.27)
Globulin, Total: 2.6 g/dL (ref 1.5–4.5)
Glucose: 225 mg/dL — ABNORMAL HIGH (ref 70–99)
Potassium: 3.5 mmol/L (ref 3.5–5.2)
Sodium: 143 mmol/L (ref 134–144)
Total Protein: 6.5 g/dL (ref 6.0–8.5)
eGFR: 50 mL/min/{1.73_m2} — ABNORMAL LOW (ref >59)

## 2023-06-10 LAB — LIPID PANEL
Chol/HDL Ratio: 7 ratio — ABNORMAL HIGH (ref 0.0–5.0)
Cholesterol, Total: 183 mg/dL (ref 100–199)
HDL: 26 mg/dL — ABNORMAL LOW (ref >39)
LDL Chol Calc (NIH): 118 mg/dL — ABNORMAL HIGH (ref 0–99)
Triglycerides: 219 mg/dL — ABNORMAL HIGH (ref 0–149)
VLDL Cholesterol Cal: 39 mg/dL (ref 5–40)

## 2023-06-11 LAB — ANEMIA PROFILE B
Basophils Absolute: 0.1 10*3/uL (ref 0.0–0.2)
Basos: 1 %
EOS (ABSOLUTE): 0.7 10*3/uL — ABNORMAL HIGH (ref 0.0–0.4)
Eos: 9 %
Ferritin: 132 ng/mL (ref 30–400)
Folate: 10.4 ng/mL (ref >3.0)
Hematocrit: 41.1 % (ref 37.5–51.0)
Hemoglobin: 13.8 g/dL (ref 13.0–17.7)
Immature Grans (Abs): 0 10*3/uL (ref 0.0–0.1)
Immature Granulocytes: 0 %
Iron Saturation: 29 % (ref 15–55)
Iron: 61 ug/dL (ref 38–169)
Lymphocytes Absolute: 2.3 10*3/uL (ref 0.7–3.1)
Lymphs: 29 %
MCH: 30.1 pg (ref 26.6–33.0)
MCHC: 33.6 g/dL (ref 31.5–35.7)
MCV: 90 fL (ref 79–97)
Monocytes Absolute: 0.5 10*3/uL (ref 0.1–0.9)
Monocytes: 6 %
Neutrophils Absolute: 4.2 10*3/uL (ref 1.4–7.0)
Neutrophils: 55 %
Platelets: 202 10*3/uL (ref 150–450)
RBC: 4.58 x10E6/uL (ref 4.14–5.80)
RDW: 12.7 % (ref 11.6–15.4)
Retic Ct Pct: 1.5 % (ref 0.6–2.6)
Total Iron Binding Capacity: 213 ug/dL — ABNORMAL LOW (ref 250–450)
UIBC: 152 ug/dL (ref 111–343)
Vitamin B-12: 796 pg/mL (ref 232–1245)
WBC: 7.7 10*3/uL (ref 3.4–10.8)

## 2023-07-11 ENCOUNTER — Other Ambulatory Visit: Payer: Self-pay | Admitting: Family Medicine

## 2023-07-11 DIAGNOSIS — B372 Candidiasis of skin and nail: Secondary | ICD-10-CM

## 2023-07-12 MED ORDER — NYSTATIN 100000 UNIT/GM EX CREA: 1.0000 | TOPICAL_CREAM | Freq: Two times a day (BID) | CUTANEOUS | 1 refills | Status: DC

## 2023-08-17 ENCOUNTER — Telehealth: Payer: Self-pay | Admitting: Podiatry

## 2023-08-17 NOTE — Telephone Encounter (Signed)
 Patient's spouse called requesting diabetic shoes, She is requesting referral for [employer]. Please contact spouse Wayne [name]) [phone]

## 2023-08-18 ENCOUNTER — Encounter: Payer: Self-pay | Admitting: Family Medicine

## 2023-08-19 ENCOUNTER — Ambulatory Visit (INDEPENDENT_AMBULATORY_CARE_PROVIDER_SITE_OTHER): Admitting: Family Medicine

## 2023-08-19 ENCOUNTER — Other Ambulatory Visit: Payer: Self-pay | Admitting: Family Medicine

## 2023-08-19 ENCOUNTER — Encounter: Payer: Self-pay | Admitting: Family Medicine

## 2023-08-19 VITALS — BP 198/73 | HR 68 | Temp 97.8°F

## 2023-08-19 DIAGNOSIS — T148XXA Other injury of unspecified body region, initial encounter: Secondary | ICD-10-CM

## 2023-08-19 DIAGNOSIS — S90822A Blister (nonthermal), left foot, initial encounter: Secondary | ICD-10-CM

## 2023-08-19 DIAGNOSIS — L089 Local infection of the skin and subcutaneous tissue, unspecified: Secondary | ICD-10-CM

## 2023-08-19 DIAGNOSIS — Z7984 Long term (current) use of oral hypoglycemic drugs: Secondary | ICD-10-CM | POA: Diagnosis not present

## 2023-08-19 DIAGNOSIS — E11628 Type 2 diabetes mellitus with other skin complications: Secondary | ICD-10-CM

## 2023-08-19 MED ORDER — DOXYCYCLINE HYCLATE 100 MG PO TABS
100.0000 mg | ORAL_TABLET | Freq: Two times a day (BID) | ORAL | 0 refills | Status: AC
Start: 2023-08-19 — End: 2023-08-29

## 2023-08-19 MED ORDER — HYPOCHLOROUS ACID 0.012 % EX GEL: 1.0000 | Freq: Two times a day (BID) | CUTANEOUS | 0 refills | Status: DC

## 2023-08-19 NOTE — Progress Notes (Addendum)
 Subjective:  Patient ID: Wayne [name], male    DOB: 08-20-1940, 83 y.o.   MRN: [id number]  Patient Care Team: Galvin Jules, FNP as PCP - General (Family Medicine) Jann Melody, MD as PCP - Cardiology (Cardiology) Charity Conch, DPM as Consulting Physician (Podiatry) Tania Familia, NP as Nurse Practitioner (Cardiology) Rudine Cos, MD as Consulting Physician (Ophthalmology)   Chief Complaint:  Blister (Left foot x 1 day)   HPI: Wayne [name] is a 83 y.o. male presenting on 08/19/2023 for Blister (Left foot x 1 day)   Wayne [name] "Al" is an 83 year old male who presents with a new blister on his foot.  He has a new blister on his foot that appeared yesterday. The blister has not burst yet, unlike a previous blister that did burst and led to significant complications, including cellulitis and bone fracture. He is concerned about preventing similar complications with this new blister.  He has not initiated any treatment for the current blister, as previous treatments with Betadine were ineffective. He was able to heal the previous blister using Silvral and Manuka honey, along with debridement using Vosh, but it took months to heal.  He is currently not using any medications for the blister but is seeking guidance on appropriate treatment to prevent infection and further complications. He inquires about the use of intramuscular antibiotics, as he feels oral antibiotics were less effective in the past.  He is concerned about activities that might cause the blister to burst, such as wearing shoes or mowing the lawn, as these activities contributed to the bursting of the previous blister.       Relevant past medical, surgical, family, and social history reviewed and updated as indicated.  Allergies and medications reviewed and updated. Data reviewed: Chart in Epic.   Past Medical History:  Diagnosis Date   Anemia of chronic disease     Chronic HFrEF (heart failure with reduced ejection fraction) (HCC)    Chronic kidney disease, stage 3a (HCC)    Coronary artery disease    Dr. Ollis Bi years ago. No details.   Diabetes mellitus with circulatory complication (HCC)    Diabetic foot infection (HCC)    Encephalopathy    Former tobacco use    Lactic acidosis 11/28/2021   Multifocal pneumonia 11/28/2021   PAD (peripheral artery disease) (HCC)    Peripheral neuropathy    SDH (subdural hematoma) (HCC)    Severe pulmonary hypertension (HCC)     Past Surgical History:  Procedure Laterality Date   ABDOMINAL AORTOGRAM W/LOWER EXTREMITY Right 11/13/2021   Procedure: ABDOMINAL AORTOGRAM W/LOWER EXTREMITY;  Surgeon: Carlene Che, MD;  Location: [facility];  Service: Cardiovascular;  Laterality: Right;   AMPUTATION Right 05/23/2022   Procedure: AMPUTATION RAY;  Surgeon: Evertt Hoe, DPM;  Location: [facility];  Service: Podiatry;  Laterality: Right;  Partial 5th ray amputation   AMPUTATION Right 08/26/2022   Procedure: MIDFOOT AMPUTATION RIGHT FOOT, BONE BIOPSY RIGHT FOOT, TENDO ACHILIES LENGTHENING;  Surgeon: Dot Gazella, DPM;  Location: [facility];  Service: Podiatry;  Laterality: Right;   APPLICATION OF WOUND VAC Right 08/26/2022   Procedure: APPLICATION OF WOUND VAC;  Surgeon: Dot Gazella, DPM;  Location: [facility];  Service: Podiatry;  Laterality: Right;   CORONARY ANGIOPLASTY WITH STENT PLACEMENT     IR FLUORO GUIDE CV LINE RIGHT  08/27/2022   IR REMOVAL TUN CV CATH W/O FL  10/08/2022   IR US   GUIDE VASC ACCESS RIGHT  08/27/2022   IRRIGATION AND DEBRIDEMENT FOOT Right 05/25/2022   Procedure: IRRIGATION AND DEBRIDEMENT FOOT;  Surgeon: Charity Conch, DPM;  Location: [facility];  Service: Podiatry;  Laterality: Right;    Social History   Socioeconomic History   Marital status: Divorced    Spouse name: Not on file   Number of children: Not on file   Years of education: Not on file   Highest education level: Not on  file  Occupational History   Not on file  Tobacco Use   Smoking status: Former    Current packs/day: 0.00    Types: Cigarettes    Quit date: 48    Years since quitting: 32.4   Smokeless tobacco: Never  Vaping Use   Vaping status: Never Used  Substance and Sexual Activity   Alcohol use: No   Drug use: No   Sexual activity: Not Currently  Other Topics Concern   Not on file  Social History Narrative   Not on file   Social Drivers of Health   Financial Resource Strain: Low Risk  (05/11/2023)   Overall Financial Resource Strain (CARDIA)    Difficulty of Paying Living Expenses: Not hard at all  Food Insecurity: No Food Insecurity (05/11/2023)   Hunger Vital Sign    Worried About Running Out of Food in the Last Year: Never true    Ran Out of Food in the Last Year: Never true  Transportation Needs: No Transportation Needs (05/11/2023)   PRAPARE - Administrator, Civil Service (Medical): No    Lack of Transportation (Non-Medical): No  Physical Activity: Inactive (05/11/2023)   Exercise Vital Sign    Days of Exercise per Week: 0 days    Minutes of Exercise per Session: 0 min  Stress: No Stress Concern Present (05/11/2023)   [employer] of Occupational Health - Occupational Stress Questionnaire    Feeling of Stress : Not at all  Social Connections: Socially Isolated (05/11/2023)   Social Connection and Isolation Panel [NHANES]    Frequency of Communication with Friends and Family: More than three times a week    Frequency of Social Gatherings with Friends and Family: More than three times a week    Attends Religious Services: Never    Database administrator or Organizations: No    Attends Banker Meetings: Never    Marital Status: Divorced  Catering manager Violence: Not At Risk (05/11/2023)   Humiliation, Afraid, Rape, and Kick questionnaire    Fear of Current or Ex-Partner: No    Emotionally Abused: No    Physically Abused: No    Sexually Abused:  No    Outpatient Encounter Medications as of 08/19/2023  Medication Sig   Accu-Chek Softclix Lancets lancets Use 4 times daily as directed to check blood sugars.   carbamide peroxide (DEBROX) 6.5 % OTIC solution Place 5 drops into both ears 2 (two) times daily.   doxycycline  (VIBRA -TABS) 100 MG tablet Take 1 tablet (100 mg total) by mouth 2 (two) times daily for 10 days. 1 po bid   furosemide  (LASIX ) 40 MG tablet Take 1 tablet (40 mg total) by mouth daily.   glipiZIDE  (GLUCOTROL ) 5 MG tablet Take 1 tablet (5 mg total) by mouth daily before breakfast.   Hypochlorous Acid 0.012 % GEL Apply 1 Application topically in the morning and at bedtime.   nystatin  cream (MYCOSTATIN ) Apply 1 Application topically 2 (two) times daily.   triamcinolone  cream (  KENALOG ) 0.1 % Apply 1 Application topically 2 (two) times daily.   [DISCONTINUED] fluconazole  (DIFLUCAN ) 150 MG tablet 1 po q week x 4 weeks   No facility-administered encounter medications on file as of 08/19/2023.    No Known Allergies  Pertinent ROS per HPI, otherwise unremarkable      Objective:  BP (!) 198/73   Pulse 68   Temp 97.8 F (36.6 C)   SpO2 98%    Wt Readings from Last 3 Encounters:  06/09/23 178 lb 12.8 oz (81.1 kg)  05/11/23 183 lb (83 kg)  04/06/23 183 lb 3.2 oz (83.1 kg)    Physical Exam Vitals and nursing note reviewed.  Constitutional:      Appearance: Normal appearance. He is ill-appearing (chronically ill).  HENT:     Head: Normocephalic and atraumatic.     Right Ear: Decreased hearing noted.     Left Ear: Decreased hearing noted.     Mouth/Throat:     Mouth: Mucous membranes are moist.  Eyes:     Pupils: Pupils are equal, round, and reactive to light.  Cardiovascular:     Rate and Rhythm: Normal rate.     Pulses: Normal pulses.  Pulmonary:     Effort: Pulmonary effort is normal.  Musculoskeletal:     Cervical back: Neck supple.     Comments: Partial amputation of right foot  Skin:    General: Skin  is warm and dry.     Capillary Refill: Capillary refill takes less than 2 seconds.     Findings: Wound (images below) present.  Neurological:     General: No focal deficit present.     Mental Status: He is alert and oriented to person, place, and time.     Gait: Gait abnormal (in wheelchair).  Psychiatric:        Mood and Affect: Mood normal.        Behavior: Behavior normal.        Thought Content: Thought content normal.        Judgment: Judgment normal.         Results for orders placed or performed in visit on 06/09/23  Bayer DCA Hb A1c Waived   Collection Time: 06/09/23 12:10 PM  Result Value Ref Range   HB A1C (BAYER DCA - WAIVED) 7.5 (H) 4.8 - 5.6 %  Lipid panel   Collection Time: 06/09/23 12:12 PM  Result Value Ref Range   Cholesterol, Total 183 100 - 199 mg/dL   Triglycerides 409 (H) 0 - 149 mg/dL   HDL 26 (L) >81 mg/dL   VLDL Cholesterol Cal 39 5 - 40 mg/dL   LDL Chol Calc (NIH) 191 (H) 0 - 99 mg/dL   Chol/HDL Ratio 7.0 (H) 0.0 - 5.0 ratio  CMP14+EGFR   Collection Time: 06/09/23 12:12 PM  Result Value Ref Range   Glucose 225 (H) 70 - 99 mg/dL   BUN 13 8 - 27 mg/dL   Creatinine, Ser 4.78 (H) 0.76 - 1.27 mg/dL   eGFR 50 (L) >29 FA/OZH/0.86   BUN/Creatinine Ratio 9 (L) 10 - 24   Sodium 143 134 - 144 mmol/L   Potassium 3.5 3.5 - 5.2 mmol/L   Chloride 101 96 - 106 mmol/L   CO2 27 20 - 29 mmol/L   Calcium  8.6 8.6 - 10.2 mg/dL   Total Protein 6.5 6.0 - 8.5 g/dL   Albumin 3.9 3.7 - 4.7 g/dL   Globulin, Total 2.6 1.5 - 4.5 g/dL  Bilirubin Total 0.7 0.0 - 1.2 mg/dL   Alkaline Phosphatase 104 44 - 121 IU/L   AST 13 0 - 40 IU/L   ALT 7 0 - 44 IU/L  Anemia Profile B   Collection Time: 06/09/23 12:48 PM  Result Value Ref Range   Total Iron Binding Capacity 213 (L) 250 - 450 ug/dL   UIBC 161 096 - 045 ug/dL   Iron 61 38 - 409 ug/dL   Iron Saturation 29 15 - 55 %   Ferritin 132 30 - 400 ng/mL   Vitamin B-12 796 232 - 1,245 pg/mL   Folate 10.4 >3.0 ng/mL   WBC  7.7 3.4 - 10.8 x10E3/uL   RBC 4.58 4.14 - 5.80 x10E6/uL   Hemoglobin 13.8 13.0 - 17.7 g/dL   Hematocrit 81.1 91.4 - 51.0 %   MCV 90 79 - 97 fL   MCH 30.1 26.6 - 33.0 pg   MCHC 33.6 31.5 - 35.7 g/dL   RDW 78.2 95.6 - 21.3 %   Platelets 202 150 - 450 x10E3/uL   Neutrophils 55 Not Estab. %   Lymphs 29 Not Estab. %   Monocytes 6 Not Estab. %   Eos 9 Not Estab. %   Basos 1 Not Estab. %   Neutrophils Absolute 4.2 1.4 - 7.0 x10E3/uL   Lymphocytes Absolute 2.3 0.7 - 3.1 x10E3/uL   Monocytes Absolute 0.5 0.1 - 0.9 x10E3/uL   EOS (ABSOLUTE) 0.7 (H) 0.0 - 0.4 x10E3/uL   Basophils Absolute 0.1 0.0 - 0.2 x10E3/uL   Immature Granulocytes 0 Not Estab. %   Immature Grans (Abs) 0.0 0.0 - 0.1 x10E3/uL   Retic Ct Pct 1.5 0.6 - 2.6 %       Pertinent labs & imaging results that were available during my care of the patient were reviewed by me and considered in my medical decision making.  Assessment & Plan:  Zyan "Al" was seen today for blister.  Diagnoses and all orders for this visit:  Diabetic foot infection (HCC) -     doxycycline  (VIBRA -TABS) 100 MG tablet; Take 1 tablet (100 mg total) by mouth 2 (two) times daily for 10 days. 1 po bid -     Hypochlorous Acid 0.012 % GEL; Apply 1 Application topically in the morning and at bedtime.  Blister -     doxycycline  (VIBRA -TABS) 100 MG tablet; Take 1 tablet (100 mg total) by mouth 2 (two) times daily for 10 days. 1 po bid -     Hypochlorous Acid 0.012 % GEL; Apply 1 Application topically in the morning and at bedtime.     Blister on foot Acute blister on the foot since yesterday. Previous blister led to cellulitis and bone involvement, resulting in foot loss. Current blister has not burst. Betadine was ineffective previously; healing was achieved with Silvral, Manuka honey, and debridement. Plan to prevent progression to infection by maintaining skin integrity and using antimicrobial treatments. - Provide dressing supplies impregnated with  topicals to aid healing. - Prescribe oral antibiotics to prevent infection, to be taken twice daily with food. - Prescribe hypochlorous acid spray for wound healing, to be applied twice daily. - Advise against popping the blister to maintain skin integrity. - Instruct to change dressing every three days and monitor for infection. - Allow gentle washing with soap and water . - Permit walking and lawn mowing with proper coverage of the blister.  Cellulitis with bone involvement Cellulitis with bone involvement in the foot, leading to bone fracture and foot  loss. Current blister management aims to prevent recurrence of cellulitis.           Continue all other maintenance medications.  Follow up plan: Return if symptoms worsen or fail to improve.   Continue healthy lifestyle choices, including diet (rich in fruits, vegetables, and lean proteins, and low in salt and simple carbohydrates) and exercise (at least 30 minutes of moderate physical activity daily).  Educational handout given for wound care  The above assessment and management plan was discussed with the patient. The patient verbalized understanding of and has agreed to the management plan. Patient is aware to call the clinic if they develop any new symptoms or if symptoms persist or worsen. Patient is aware when to return to the clinic for a follow-up visit. Patient educated on when it is appropriate to go to the emergency department.   Kattie Parrot, FNP-C [facility] [phone]

## 2023-09-01 ENCOUNTER — Other Ambulatory Visit: Payer: Self-pay | Admitting: Podiatry

## 2023-09-01 DIAGNOSIS — Z89431 Acquired absence of right foot: Secondary | ICD-10-CM

## 2023-09-01 DIAGNOSIS — E1151 Type 2 diabetes mellitus with diabetic peripheral angiopathy without gangrene: Secondary | ICD-10-CM

## 2023-09-08 ENCOUNTER — Ambulatory Visit: Payer: Self-pay | Admitting: Family Medicine

## 2023-09-08 ENCOUNTER — Encounter: Payer: Self-pay | Admitting: Family Medicine

## 2023-09-08 ENCOUNTER — Ambulatory Visit (INDEPENDENT_AMBULATORY_CARE_PROVIDER_SITE_OTHER): Admitting: Family Medicine

## 2023-09-08 VITALS — BP 162/68 | HR 72 | Temp 97.3°F | Ht 73.0 in | Wt 181.8 lb

## 2023-09-08 DIAGNOSIS — F01C0 Vascular dementia, severe, without behavioral disturbance, psychotic disturbance, mood disturbance, and anxiety: Secondary | ICD-10-CM

## 2023-09-08 DIAGNOSIS — I129 Hypertensive chronic kidney disease with stage 1 through stage 4 chronic kidney disease, or unspecified chronic kidney disease: Secondary | ICD-10-CM | POA: Diagnosis not present

## 2023-09-08 DIAGNOSIS — I152 Hypertension secondary to endocrine disorders: Secondary | ICD-10-CM | POA: Diagnosis not present

## 2023-09-08 DIAGNOSIS — E1122 Type 2 diabetes mellitus with diabetic chronic kidney disease: Secondary | ICD-10-CM | POA: Diagnosis not present

## 2023-09-08 DIAGNOSIS — E1151 Type 2 diabetes mellitus with diabetic peripheral angiopathy without gangrene: Secondary | ICD-10-CM

## 2023-09-08 DIAGNOSIS — N183 Chronic kidney disease, stage 3 unspecified: Secondary | ICD-10-CM

## 2023-09-08 DIAGNOSIS — E1159 Type 2 diabetes mellitus with other circulatory complications: Secondary | ICD-10-CM

## 2023-09-08 DIAGNOSIS — E1169 Type 2 diabetes mellitus with other specified complication: Secondary | ICD-10-CM

## 2023-09-08 LAB — BAYER DCA HB A1C WAIVED: HB A1C (BAYER DCA - WAIVED): 7.9 % — ABNORMAL HIGH (ref 4.8–5.6)

## 2023-09-08 MED ORDER — LISINOPRIL 5 MG PO TABS: 5.0000 mg | ORAL_TABLET | Freq: Every evening | ORAL | 3 refills | Status: DC

## 2023-09-08 NOTE — Progress Notes (Signed)
 Subjective:  Patient ID: Joni Norrod, male    DOB: 25-Feb-1941, 83 y.o.   MRN: [id number]  Patient Care Team: Galvin Jules, FNP as PCP - General (Family Medicine) Jann Melody, MD as PCP - Cardiology (Cardiology) Charity Conch, DPM as Consulting Physician (Podiatry) Tania Familia, NP as Nurse Practitioner (Cardiology) Rudine Cos, MD as Consulting Physician (Ophthalmology)   Chief Complaint:  Diabetes (3 month follow up- patients htn and BS have been up and down )   HPI: Wilmore Holsomback is a 83 y.o. male presenting on 09/08/2023 for Diabetes (3 month follow up- patients htn and BS have been up and down )   Santina Cull Al is an 83 year old male with diabetes and hypertension who presents with concerns about elevated blood sugar and blood pressure readings.  He is experiencing elevated blood sugar levels, with recent readings being higher than usual. His last A1c was 7.5. There have been no significant changes in diet, as he states he is eating the 'same old, same old.' Blood sugar checks are not done fasting, often occurring shortly after meals, which may contribute to higher readings. He is currently taking glipizide , with Heather administering two tablets due to higher readings.  His blood pressure has also been elevated, with several readings above the target range. He acknowledges a possible increase in salt intake and a decrease in water  consumption. He is taking furosemide  daily to manage his blood pressure.  No recent illnesses, changes in urination, or constipation issues. No abnormal bleeding, bruising, or bloating. He is not currently taking B12 supplements.  His foot had a previous issue and has been treated with a patch and Vosh, an alternative to hydrochlorous acid. He has been mowing his yard.  He reports sleeping well and denies any pain. No ear pain.          Relevant past medical, surgical, family, and social history  reviewed and updated as indicated.  Allergies and medications reviewed and updated. Data reviewed: Chart in Epic.   Past Medical History:  Diagnosis Date   Anemia of chronic disease    Chronic HFrEF (heart failure with reduced ejection fraction) (HCC)    Chronic kidney disease, stage 3a (HCC)    Coronary artery disease    Dr. Ollis Bi years ago. No details.   Diabetes mellitus with circulatory complication (HCC)    Diabetic foot infection (HCC)    Encephalopathy    Former tobacco use    Lactic acidosis 11/28/2021   Multifocal pneumonia 11/28/2021   PAD (peripheral artery disease) (HCC)    Peripheral neuropathy    SDH (subdural hematoma) (HCC)    Severe pulmonary hypertension (HCC)     Past Surgical History:  Procedure Laterality Date   ABDOMINAL AORTOGRAM W/LOWER EXTREMITY Right 11/13/2021   Procedure: ABDOMINAL AORTOGRAM W/LOWER EXTREMITY;  Surgeon: Carlene Che, MD;  Location: [facility];  Service: Cardiovascular;  Laterality: Right;   AMPUTATION Right 05/23/2022   Procedure: AMPUTATION RAY;  Surgeon: Evertt Hoe, DPM;  Location: [facility];  Service: Podiatry;  Laterality: Right;  Partial 5th ray amputation   AMPUTATION Right 08/26/2022   Procedure: MIDFOOT AMPUTATION RIGHT FOOT, BONE BIOPSY RIGHT FOOT, TENDO ACHILIES LENGTHENING;  Surgeon: Dot Gazella, DPM;  Location: [facility];  Service: Podiatry;  Laterality: Right;   APPLICATION OF WOUND VAC Right 08/26/2022   Procedure: APPLICATION OF WOUND VAC;  Surgeon: Dot Gazella, DPM;  Location: [facility];  Service: Podiatry;  Laterality: Right;   CORONARY ANGIOPLASTY WITH STENT PLACEMENT     IR FLUORO GUIDE CV LINE RIGHT  08/27/2022   IR REMOVAL TUN CV CATH W/O FL  10/08/2022   IR US  GUIDE VASC ACCESS RIGHT  08/27/2022   IRRIGATION AND DEBRIDEMENT FOOT Right 05/25/2022   Procedure: IRRIGATION AND DEBRIDEMENT FOOT;  Surgeon: Charity Conch, DPM;  Location: [facility];  Service: Podiatry;  Laterality: Right;    Social  History   Socioeconomic History   Marital status: Divorced    Spouse name: Not on file   Number of children: Not on file   Years of education: Not on file   Highest education level: Not on file  Occupational History   Not on file  Tobacco Use   Smoking status: Former    Current packs/day: 0.00    Types: Cigarettes    Quit date: 64    Years since quitting: 32.4   Smokeless tobacco: Never  Vaping Use   Vaping status: Never Used  Substance and Sexual Activity   Alcohol use: No   Drug use: No   Sexual activity: Not Currently  Other Topics Concern   Not on file  Social History Narrative   Not on file   Social Drivers of Health   Financial Resource Strain: Low Risk  (05/11/2023)   Overall Financial Resource Strain (CARDIA)    Difficulty of Paying Living Expenses: Not hard at all  Food Insecurity: No Food Insecurity (05/11/2023)   Hunger Vital Sign    Worried About Running Out of Food in the Last Year: Never true    Ran Out of Food in the Last Year: Never true  Transportation Needs: No Transportation Needs (05/11/2023)   PRAPARE - Administrator, Civil Service (Medical): No    Lack of Transportation (Non-Medical): No  Physical Activity: Inactive (05/11/2023)   Exercise Vital Sign    Days of Exercise per Week: 0 days    Minutes of Exercise per Session: 0 min  Stress: No Stress Concern Present (05/11/2023)   [employer] of Occupational Health - Occupational Stress Questionnaire    Feeling of Stress : Not at all  Social Connections: Socially Isolated (05/11/2023)   Social Connection and Isolation Panel    Frequency of Communication with Friends and Family: More than three times a week    Frequency of Social Gatherings with Friends and Family: More than three times a week    Attends Religious Services: Never    Database administrator or Organizations: No    Attends Banker Meetings: Never    Marital Status: Divorced  Catering manager Violence:  Not At Risk (05/11/2023)   Humiliation, Afraid, Rape, and Kick questionnaire    Fear of Current or Ex-Partner: No    Emotionally Abused: No    Physically Abused: No    Sexually Abused: No    Outpatient Encounter Medications as of 09/08/2023  Medication Sig   Accu-Chek Softclix Lancets lancets Use 4 times daily as directed to check blood sugars.   carbamide peroxide (DEBROX) 6.5 % OTIC solution Place 5 drops into both ears 2 (two) times daily.   furosemide  (LASIX ) 40 MG tablet Take 1 tablet (40 mg total) by mouth daily.   glipiZIDE  (GLUCOTROL ) 5 MG tablet Take 1 tablet (5 mg total) by mouth daily before breakfast.   Hypochlorous Acid (HYCLODEX) 0.012 % SOLN APPLY TOPICALLY IN THE MORNING AND AT BEDTIME   lisinopril  (ZESTRIL ) 5 MG tablet Take  1 tablet (5 mg total) by mouth at bedtime.   nystatin  cream (MYCOSTATIN ) Apply 1 Application topically 2 (two) times daily.   triamcinolone  cream (KENALOG ) 0.1 % Apply 1 Application topically 2 (two) times daily.   No facility-administered encounter medications on file as of 09/08/2023.    No Known Allergies  Pertinent ROS per HPI, otherwise unremarkable      Objective:  BP (!) 162/68   Pulse 72   Temp (!) 97.3 F (36.3 C)   Ht 6' 1 (1.854 m)   Wt 181 lb 12.8 oz (82.5 kg)   SpO2 97%   BMI 23.99 kg/m    Wt Readings from Last 3 Encounters:  09/08/23 181 lb 12.8 oz (82.5 kg)  06/09/23 178 lb 12.8 oz (81.1 kg)  05/11/23 183 lb (83 kg)    Physical Exam Vitals and nursing note reviewed.  Constitutional:      General: He is not in acute distress.    Appearance: He is normal weight. He is ill-appearing (chronically ill). He is not toxic-appearing or diaphoretic.  HENT:     Head: Normocephalic and atraumatic.     Right Ear: Tympanic membrane, ear canal and external ear normal. Decreased hearing noted.     Left Ear: Tympanic membrane, ear canal and external ear normal. Decreased hearing noted.     Mouth/Throat:     Mouth: Mucous  membranes are moist.   Eyes:     Conjunctiva/sclera: Conjunctivae normal.     Pupils: Pupils are equal, round, and reactive to light.    Cardiovascular:     Rate and Rhythm: Normal rate and regular rhythm.     Heart sounds: Normal heart sounds.  Pulmonary:     Effort: Pulmonary effort is normal.     Breath sounds: Normal breath sounds.   Musculoskeletal:     Cervical back: Neck supple.     Right lower leg: No edema.     Left lower leg: No edema.       Feet:   Skin:    General: Skin is warm and dry.     Capillary Refill: Capillary refill takes less than 2 seconds.   Neurological:     Mental Status: He is alert. Mental status is at baseline.     Gait: Gait abnormal (using rolling walker).   Psychiatric:        Mood and Affect: Mood normal.        Behavior: Behavior normal.     Results for orders placed or performed in visit on 06/09/23  Bayer DCA Hb A1c Waived   Collection Time: 06/09/23 12:10 PM  Result Value Ref Range   HB A1C (BAYER DCA - WAIVED) 7.5 (H) 4.8 - 5.6 %  Lipid panel   Collection Time: 06/09/23 12:12 PM  Result Value Ref Range   Cholesterol, Total 183 100 - 199 mg/dL   Triglycerides 960 (H) 0 - 149 mg/dL   HDL 26 (L) >45 mg/dL   VLDL Cholesterol Cal 39 5 - 40 mg/dL   LDL Chol Calc (NIH) 409 (H) 0 - 99 mg/dL   Chol/HDL Ratio 7.0 (H) 0.0 - 5.0 ratio  CMP14+EGFR   Collection Time: 06/09/23 12:12 PM  Result Value Ref Range   Glucose 225 (H) 70 - 99 mg/dL   BUN 13 8 - 27 mg/dL   Creatinine, Ser 8.11 (H) 0.76 - 1.27 mg/dL   eGFR 50 (L) >91 YN/WGN/5.62   BUN/Creatinine Ratio 9 (L) 10 - 24   Sodium  143 134 - 144 mmol/L   Potassium 3.5 3.5 - 5.2 mmol/L   Chloride 101 96 - 106 mmol/L   CO2 27 20 - 29 mmol/L   Calcium  8.6 8.6 - 10.2 mg/dL   Total Protein 6.5 6.0 - 8.5 g/dL   Albumin 3.9 3.7 - 4.7 g/dL   Globulin, Total 2.6 1.5 - 4.5 g/dL   Bilirubin Total 0.7 0.0 - 1.2 mg/dL   Alkaline Phosphatase 104 44 - 121 IU/L   AST 13 0 - 40 IU/L   ALT 7 0 -  44 IU/L  Anemia Profile B   Collection Time: 06/09/23 12:48 PM  Result Value Ref Range   Total Iron Binding Capacity 213 (L) 250 - 450 ug/dL   UIBC 130 865 - 784 ug/dL   Iron 61 38 - 696 ug/dL   Iron Saturation 29 15 - 55 %   Ferritin 132 30 - 400 ng/mL   Vitamin B-12 796 232 - 1,245 pg/mL   Folate 10.4 >3.0 ng/mL   WBC 7.7 3.4 - 10.8 x10E3/uL   RBC 4.58 4.14 - 5.80 x10E6/uL   Hemoglobin 13.8 13.0 - 17.7 g/dL   Hematocrit 29.5 28.4 - 51.0 %   MCV 90 79 - 97 fL   MCH 30.1 26.6 - 33.0 pg   MCHC 33.6 31.5 - 35.7 g/dL   RDW 13.2 44.0 - 10.2 %   Platelets 202 150 - 450 x10E3/uL   Neutrophils 55 Not Estab. %   Lymphs 29 Not Estab. %   Monocytes 6 Not Estab. %   Eos 9 Not Estab. %   Basos 1 Not Estab. %   Neutrophils Absolute 4.2 1.4 - 7.0 x10E3/uL   Lymphocytes Absolute 2.3 0.7 - 3.1 x10E3/uL   Monocytes Absolute 0.5 0.1 - 0.9 x10E3/uL   EOS (ABSOLUTE) 0.7 (H) 0.0 - 0.4 x10E3/uL   Basophils Absolute 0.1 0.0 - 0.2 x10E3/uL   Immature Granulocytes 0 Not Estab. %   Immature Grans (Abs) 0.0 0.0 - 0.1 x10E3/uL   Retic Ct Pct 1.5 0.6 - 2.6 %       Pertinent labs & imaging results that were available during my care of the patient were reviewed by me and considered in my medical decision making.  Assessment & Plan:  Ramses Klecka was seen today for diabetes.  Diagnoses and all orders for this visit:  Type 2 diabetes mellitus with other specified complication, without long-term current use of insulin  (HCC) -     Bayer DCA Hb A1c Waived -     CMP14+EGFR -     Lipid panel -     Microalbumin / creatinine urine ratio -     lisinopril  (ZESTRIL ) 5 MG tablet; Take 1 tablet (5 mg total) by mouth at bedtime.  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) -     Bayer DCA Hb A1c Waived -     CMP14+EGFR -     Lipid panel -     Microalbumin / creatinine urine ratio -     lisinopril  (ZESTRIL ) 5 MG tablet; Take 1 tablet (5 mg total) by mouth at bedtime.  Hypertension  associated with diabetes (HCC) -     Bayer DCA Hb A1c Waived -     CMP14+EGFR -     Lipid panel -     Microalbumin / creatinine urine ratio -     lisinopril  (ZESTRIL ) 5 MG tablet; Take 1 tablet (5 mg total) by mouth at bedtime.  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 Consistent high readings are concerning. He has been taking furosemide  (Lasix ) daily. Lisinopril  will be added for better blood pressure control and renal protection. - Add low dose lisinopril  to be taken at bedtime - Advise reduction in salt intake - Continue furosemide  (Lasix ) daily  Type 2 Diabetes Mellitus Blood sugar levels have been fluctuating, with a recent A1c of 7.9, which is an improvement from the previous 7.5. He is currently taking glipizide , and the dosage has been increased to two tablets due to higher readings. Monitoring is necessary. - Continue glipizide  at current dosage - Advise checking blood sugar levels at least two hours postprandial - Monitor A1c levels  Foot Ulcer The foot ulcer has healed well with the use of a patch and hypochlorous acid spray. Continued use of the spray is advised for any future wounds. - Continue using hypochlorous acid spray for wound care as needed  General Health Maintenance He is not currently taking B12 supplements. - Check B12 levels at next visit  Mobility Concerns Will follow up on the request to have his mailbox moved closer to his house  due to mobility concerns. Archivist regarding mailbox relocation form - Message him via MyChart with updates on mailbox form          Continue all other maintenance medications.  Follow up plan: Return for 3-4 months for chronic follow up, all labs.   Continue healthy lifestyle choices, including diet (rich in fruits, vegetables, and lean proteins, and low in salt and simple carbohydrates) and exercise (at least 30 minutes of moderate physical activity daily).  Educational handout given for DM  The above assessment and management plan was discussed with the patient. The patient verbalized understanding of and has agreed to the management plan. Patient is aware to call the clinic if they develop any new symptoms or if symptoms persist or worsen. Patient is aware when to return to the clinic for a follow-up visit. Patient educated on when it is appropriate to go to the emergency department.   Kattie Parrot, FNP-C [facility] [phone]

## 2023-09-08 NOTE — Patient Instructions (Addendum)

## 2023-09-09 ENCOUNTER — Ambulatory Visit: Admitting: Family Medicine

## 2023-09-09 LAB — CMP14+EGFR
ALT: 9 IU/L (ref 0–44)
AST: 12 IU/L (ref 0–40)
Albumin: 4.2 g/dL (ref 3.7–4.7)
Alkaline Phosphatase: 80 IU/L (ref 44–121)
BUN/Creatinine Ratio: 11 (ref 10–24)
BUN: 15 mg/dL (ref 8–27)
Bilirubin Total: 0.6 mg/dL (ref 0.0–1.2)
CO2: 25 mmol/L (ref 20–29)
Calcium: 9 mg/dL (ref 8.6–10.2)
Chloride: 101 mmol/L (ref 96–106)
Creatinine, Ser: 1.4 mg/dL — ABNORMAL HIGH (ref 0.76–1.27)
Globulin, Total: 2.5 g/dL (ref 1.5–4.5)
Glucose: 218 mg/dL — ABNORMAL HIGH (ref 70–99)
Potassium: 3.9 mmol/L (ref 3.5–5.2)
Sodium: 141 mmol/L (ref 134–144)
Total Protein: 6.7 g/dL (ref 6.0–8.5)
eGFR: 50 mL/min/{1.73_m2} — ABNORMAL LOW (ref >59)

## 2023-09-09 LAB — LIPID PANEL
Chol/HDL Ratio: 6.7 ratio — ABNORMAL HIGH (ref 0.0–5.0)
Cholesterol, Total: 182 mg/dL (ref 100–199)
HDL: 27 mg/dL — ABNORMAL LOW (ref >39)
LDL Chol Calc (NIH): 92 mg/dL (ref 0–99)
Triglycerides: 378 mg/dL — ABNORMAL HIGH (ref 0–149)
VLDL Cholesterol Cal: 63 mg/dL — ABNORMAL HIGH (ref 5–40)

## 2023-11-26 ENCOUNTER — Other Ambulatory Visit: Payer: Self-pay | Admitting: Family Medicine

## 2023-11-26 DIAGNOSIS — E1169 Type 2 diabetes mellitus with other specified complication: Secondary | ICD-10-CM

## 2023-12-23 ENCOUNTER — Encounter: Payer: Self-pay | Admitting: Family Medicine

## 2023-12-23 ENCOUNTER — Other Ambulatory Visit: Payer: Self-pay | Admitting: Family Medicine

## 2023-12-23 DIAGNOSIS — E1169 Type 2 diabetes mellitus with other specified complication: Secondary | ICD-10-CM

## 2023-12-23 NOTE — Telephone Encounter (Signed)
Left message to schedule appt

## 2023-12-23 NOTE — Telephone Encounter (Signed)
 Michelle NTBS in Oct for 4 mos FU RF sent to pharmacy

## 2023-12-29 ENCOUNTER — Other Ambulatory Visit: Payer: Self-pay | Admitting: Family Medicine

## 2023-12-29 DIAGNOSIS — E1122 Type 2 diabetes mellitus with diabetic chronic kidney disease: Secondary | ICD-10-CM

## 2023-12-29 DIAGNOSIS — I5043 Acute on chronic combined systolic (congestive) and diastolic (congestive) heart failure: Secondary | ICD-10-CM

## 2024-01-21 ENCOUNTER — Other Ambulatory Visit: Payer: Self-pay | Admitting: Family Medicine

## 2024-01-21 DIAGNOSIS — N183 Chronic kidney disease, stage 3 unspecified: Secondary | ICD-10-CM

## 2024-01-21 DIAGNOSIS — I5043 Acute on chronic combined systolic (congestive) and diastolic (congestive) heart failure: Secondary | ICD-10-CM

## 2024-01-23 ENCOUNTER — Encounter: Payer: Self-pay | Admitting: Family Medicine

## 2024-01-23 NOTE — Telephone Encounter (Signed)
 Letter sent

## 2024-01-23 NOTE — Telephone Encounter (Signed)
 Wayne [name] pt NTBS 30-d given 12/29/23

## 2024-01-25 ENCOUNTER — Other Ambulatory Visit: Payer: Self-pay | Admitting: Family Medicine

## 2024-01-25 DIAGNOSIS — E1122 Type 2 diabetes mellitus with diabetic chronic kidney disease: Secondary | ICD-10-CM

## 2024-01-25 DIAGNOSIS — I5043 Acute on chronic combined systolic (congestive) and diastolic (congestive) heart failure: Secondary | ICD-10-CM

## 2024-01-31 ENCOUNTER — Emergency Department (HOSPITAL_COMMUNITY)

## 2024-01-31 ENCOUNTER — Inpatient Hospital Stay (HOSPITAL_COMMUNITY)
Admission: EM | Admit: 2024-01-31 | Discharge: 2024-02-04 | DRG: 522 | Disposition: A | Discharge status: Skilled Nursing Facility | Attending: Family Medicine | Admitting: Family Medicine

## 2024-01-31 ENCOUNTER — Encounter (HOSPITAL_COMMUNITY): Payer: Self-pay | Admitting: Emergency Medicine

## 2024-01-31 ENCOUNTER — Other Ambulatory Visit: Payer: Self-pay

## 2024-01-31 DIAGNOSIS — Z604 Social exclusion and rejection: Secondary | ICD-10-CM | POA: Diagnosis present

## 2024-01-31 DIAGNOSIS — E1151 Type 2 diabetes mellitus with diabetic peripheral angiopathy without gangrene: Secondary | ICD-10-CM | POA: Diagnosis present

## 2024-01-31 DIAGNOSIS — I2510 Atherosclerotic heart disease of native coronary artery without angina pectoris: Secondary | ICD-10-CM | POA: Diagnosis present

## 2024-01-31 DIAGNOSIS — F0390 Unspecified dementia without behavioral disturbance: Secondary | ICD-10-CM | POA: Diagnosis present

## 2024-01-31 DIAGNOSIS — Z66 Do not resuscitate: Secondary | ICD-10-CM | POA: Diagnosis not present

## 2024-01-31 DIAGNOSIS — S72001D Fracture of unspecified part of neck of right femur, subsequent encounter for closed fracture with routine healing: Secondary | ICD-10-CM | POA: Diagnosis not present

## 2024-01-31 DIAGNOSIS — Z87891 Personal history of nicotine dependence: Secondary | ICD-10-CM | POA: Diagnosis not present

## 2024-01-31 DIAGNOSIS — E785 Hyperlipidemia, unspecified: Secondary | ICD-10-CM | POA: Diagnosis present

## 2024-01-31 DIAGNOSIS — Z89431 Acquired absence of right foot: Secondary | ICD-10-CM

## 2024-01-31 DIAGNOSIS — E1140 Type 2 diabetes mellitus with diabetic neuropathy, unspecified: Secondary | ICD-10-CM | POA: Diagnosis present

## 2024-01-31 DIAGNOSIS — E1122 Type 2 diabetes mellitus with diabetic chronic kidney disease: Secondary | ICD-10-CM | POA: Diagnosis present

## 2024-01-31 DIAGNOSIS — W010XXA Fall on same level from slipping, tripping and stumbling without subsequent striking against object, initial encounter: Secondary | ICD-10-CM | POA: Diagnosis present

## 2024-01-31 DIAGNOSIS — D631 Anemia in chronic kidney disease: Secondary | ICD-10-CM | POA: Diagnosis present

## 2024-01-31 DIAGNOSIS — E1165 Type 2 diabetes mellitus with hyperglycemia: Secondary | ICD-10-CM | POA: Diagnosis present

## 2024-01-31 DIAGNOSIS — S72001A Fracture of unspecified part of neck of right femur, initial encounter for closed fracture: Secondary | ICD-10-CM | POA: Diagnosis present

## 2024-01-31 DIAGNOSIS — I130 Hypertensive heart and chronic kidney disease with heart failure and stage 1 through stage 4 chronic kidney disease, or unspecified chronic kidney disease: Secondary | ICD-10-CM | POA: Diagnosis present

## 2024-01-31 DIAGNOSIS — I5032 Chronic diastolic (congestive) heart failure: Secondary | ICD-10-CM | POA: Diagnosis present

## 2024-01-31 DIAGNOSIS — W19XXXA Unspecified fall, initial encounter: Principal | ICD-10-CM

## 2024-01-31 DIAGNOSIS — N1831 Chronic kidney disease, stage 3a: Secondary | ICD-10-CM | POA: Diagnosis present

## 2024-01-31 DIAGNOSIS — Z7984 Long term (current) use of oral hypoglycemic drugs: Secondary | ICD-10-CM

## 2024-01-31 DIAGNOSIS — Z955 Presence of coronary angioplasty implant and graft: Secondary | ICD-10-CM | POA: Diagnosis not present

## 2024-01-31 DIAGNOSIS — S72011A Unspecified intracapsular fracture of right femur, initial encounter for closed fracture: Secondary | ICD-10-CM | POA: Diagnosis present

## 2024-01-31 DIAGNOSIS — I10 Essential (primary) hypertension: Secondary | ICD-10-CM | POA: Diagnosis not present

## 2024-01-31 HISTORY — DX: Essential (primary) hypertension: I10

## 2024-01-31 LAB — GLUCOSE, CAPILLARY: Glucose-Capillary: 259 mg/dL — ABNORMAL HIGH (ref 70–99)

## 2024-01-31 LAB — CBC WITH DIFFERENTIAL/PLATELET
Abs Immature Granulocytes: 0.03 K/uL (ref 0.00–0.07)
Basophils Absolute: 0 K/uL (ref 0.0–0.1)
Basophils Relative: 0 %
Eosinophils Absolute: 0.1 K/uL (ref 0.0–0.5)
Eosinophils Relative: 1 %
HCT: 45.7 % (ref 39.0–52.0)
Hemoglobin: 16.1 g/dL (ref 13.0–17.0)
Immature Granulocytes: 0 %
Lymphocytes Relative: 16 %
Lymphs Abs: 1.1 K/uL (ref 0.7–4.0)
MCH: 30.3 pg (ref 26.0–34.0)
MCHC: 35.2 g/dL (ref 30.0–36.0)
MCV: 86.1 fL (ref 80.0–100.0)
Monocytes Absolute: 0.5 K/uL (ref 0.1–1.0)
Monocytes Relative: 8 %
Neutro Abs: 5.4 K/uL (ref 1.7–7.7)
Neutrophils Relative %: 75 %
Platelets: 148 K/uL — ABNORMAL LOW (ref 150–400)
RBC: 5.31 MIL/uL (ref 4.22–5.81)
RDW: 12.4 % (ref 11.5–15.5)
WBC: 7.2 K/uL (ref 4.0–10.5)
nRBC: 0 % (ref 0.0–0.2)

## 2024-01-31 LAB — MAGNESIUM: Magnesium: 2.4 mg/dL (ref 1.7–2.4)

## 2024-01-31 LAB — BASIC METABOLIC PANEL WITH GFR
Anion gap: 13 (ref 5–15)
BUN: 25 mg/dL — ABNORMAL HIGH (ref 8–23)
CO2: 25 mmol/L (ref 22–32)
Calcium: 9.1 mg/dL (ref 8.9–10.3)
Chloride: 101 mmol/L (ref 98–111)
Creatinine, Ser: 1.6 mg/dL — ABNORMAL HIGH (ref 0.61–1.24)
GFR, Estimated: 42 mL/min — ABNORMAL LOW (ref >60)
Glucose, Bld: 224 mg/dL — ABNORMAL HIGH (ref 70–99)
Potassium: 3.8 mmol/L (ref 3.5–5.1)
Sodium: 139 mmol/L (ref 135–145)

## 2024-01-31 MED ORDER — NITROGLYCERIN 2 % TD OINT
1.0000 [in_us] | TOPICAL_OINTMENT | Freq: Once | TRANSDERMAL | Status: AC
  Administered 2024-01-31: 1 [in_us] via TOPICAL
  Filled 2024-01-31: qty 1

## 2024-01-31 MED ORDER — LABETALOL HCL 5 MG/ML IV SOLN: 10.0000 mg | INTRAVENOUS | Status: DC | PRN

## 2024-01-31 MED ORDER — ONDANSETRON HCL 4 MG PO TABS: 4.0000 mg | ORAL_TABLET | Freq: Four times a day (QID) | ORAL | Status: DC | PRN

## 2024-01-31 MED ORDER — FUROSEMIDE 40 MG PO TABS
40.0000 mg | ORAL_TABLET | Freq: Every day | ORAL | Status: DC
  Administered 2024-02-01 – 2024-02-03 (×2): 40 mg via ORAL
  Filled 2024-01-31 (×3): qty 1

## 2024-01-31 MED ORDER — HYDROMORPHONE HCL 1 MG/ML IJ SOLN
0.5000 mg | INTRAMUSCULAR | Status: DC | PRN
  Administered 2024-01-31 – 2024-02-01 (×2): 0.5 mg via INTRAVENOUS
  Filled 2024-01-31 (×2): qty 0.5

## 2024-01-31 MED ORDER — POLYETHYLENE GLYCOL 3350 17 G PO PACK: 17.0000 g | PACK | Freq: Every day | ORAL | Status: DC | PRN

## 2024-01-31 MED ORDER — ACETAMINOPHEN 650 MG RE SUPP: 650.0000 mg | Freq: Four times a day (QID) | RECTAL | Status: DC | PRN

## 2024-01-31 MED ORDER — ACETAMINOPHEN 325 MG PO TABS: 650.0000 mg | ORAL_TABLET | Freq: Four times a day (QID) | ORAL | Status: DC | PRN

## 2024-01-31 MED ORDER — HYDROMORPHONE HCL 1 MG/ML IJ SOLN
0.5000 mg | Freq: Once | INTRAMUSCULAR | Status: AC
  Administered 2024-01-31: 0.5 mg via INTRAVENOUS
  Filled 2024-01-31: qty 0.5

## 2024-01-31 MED ORDER — ONDANSETRON HCL 4 MG/2ML IJ SOLN: 4.0000 mg | Freq: Four times a day (QID) | INTRAMUSCULAR | Status: DC | PRN

## 2024-01-31 MED ORDER — LISINOPRIL 10 MG PO TABS
10.0000 mg | ORAL_TABLET | Freq: Once | ORAL | Status: AC
  Administered 2024-01-31: 10 mg via ORAL
  Filled 2024-01-31: qty 1

## 2024-01-31 MED ORDER — HYDRALAZINE HCL 20 MG/ML IJ SOLN
5.0000 mg | Freq: Once | INTRAMUSCULAR | Status: AC
  Administered 2024-01-31: 5 mg via INTRAVENOUS
  Filled 2024-01-31: qty 1

## 2024-01-31 MED ORDER — INSULIN ASPART 100 UNIT/ML IJ SOLN
0.0000 [IU] | Freq: Four times a day (QID) | INTRAMUSCULAR | Status: DC
  Administered 2024-01-31: 5 [IU] via SUBCUTANEOUS
  Administered 2024-02-01: 2 [IU] via SUBCUTANEOUS
  Filled 2024-01-31 (×2): qty 1

## 2024-01-31 MED ORDER — ORAL CARE MOUTH RINSE: 15.0000 mL | OROMUCOSAL | Status: DC | PRN

## 2024-01-31 NOTE — ED Provider Notes (Signed)
 [place] EMERGENCY DEPARTMENT AT [facility] Provider Note   CSN: [id number] Arrival date & time: 01/31/24  1543     Patient presents with: Wayne [name] is a 83 y.o. male.   HPI Patient presents for fall.  Medical history includes DM, neuropathy, PAD, CAD, CHF, CKD, HLD, anemia, dementia.  At baseline, he ambulates with a walker.  Shortly prior to arrival, he was walking towards his recliner when he turned and had a mechanical fall due to loss of balance.  When he fell, he landed on his right side.  He did strike his head.  He has since had pain in area of right hip.  EMS noted hypertension prior to arrival.  Patient states that he did take medication this morning but is unsure of what medication that was.  He did not receive any pain medicine prior to arrival.  Patient endorses ongoing pain in his right hip but denies any other areas of discomfort.    Prior to Admission medications   Medication Sig Start Date End Date Taking? Authorizing Provider  Accu-Chek Softclix Lancets lancets Use 4 times daily as directed to check blood sugars. 11/16/21   Bryn Bernardino NOVAK, MD  carbamide peroxide (DEBROX) 6.5 % OTIC solution Place 5 drops into both ears 2 (two) times daily. 03/11/23   Severa Rock HERO, FNP  furosemide  (LASIX ) 40 MG tablet Take 1 tablet (40 mg total) by mouth daily. **NEEDS TO BE SEEN BEFORE NEXT REFILL** 12/29/23   Rakes, Rock HERO, FNP  glipiZIDE  (GLUCOTROL ) 5 MG tablet TAKE 1 TABLET (5 MG TOTAL) BY MOUTH DAILY BEFORE BREAKFAST. **NEEDS TO BE SEEN BEFORE NEXT REFILL** 12/23/23   Severa Rock HERO, FNP  Hypochlorous Acid (HYCLODEX) 0.012 % SOLN APPLY TOPICALLY IN THE MORNING AND AT BEDTIME 08/19/23   Severa Rock HERO, FNP  lisinopril  (ZESTRIL ) 5 MG tablet Take 1 tablet (5 mg total) by mouth at bedtime. 09/08/23   Severa Rock HERO, FNP  nystatin  cream (MYCOSTATIN ) Apply 1 Application topically 2 (two) times daily. 07/12/23   Severa Rock HERO, FNP  triamcinolone  cream (KENALOG ) 0.1 %  Apply 1 Application topically 2 (two) times daily. 06/09/23   Severa Rock HERO, FNP    Allergies: Patient has no known allergies.    Review of Systems  Musculoskeletal:  Positive for arthralgias.  All other systems reviewed and are negative.   Updated Vital Signs BP (!) 177/82   Pulse 83   Temp 97.9 F (36.6 C)   Resp 18   Ht 6' 1 (1.854 m)   Wt 82.5 kg   SpO2 93%   BMI 24.00 kg/m   Physical Exam Vitals and nursing note reviewed.  Constitutional:      General: He is not in acute distress.    Appearance: Normal appearance. He is well-developed. He is not ill-appearing, toxic-appearing or diaphoretic.  HENT:     Head: Normocephalic and atraumatic.     Right Ear: External ear normal.     Left Ear: External ear normal.     Nose: Nose normal.     Mouth/Throat:     Mouth: Mucous membranes are moist.  Eyes:     Extraocular Movements: Extraocular movements intact.     Conjunctiva/sclera: Conjunctivae normal.  Cardiovascular:     Rate and Rhythm: Normal rate and regular rhythm.  Pulmonary:     Effort: Pulmonary effort is normal. No respiratory distress.  Chest:     Chest wall: No tenderness.  Abdominal:  General: There is no distension.     Palpations: Abdomen is soft.     Tenderness: There is no abdominal tenderness.  Musculoskeletal:        General: No swelling.     Cervical back: Normal range of motion and neck supple.     Comments: Prior TMT amputation of right foot.  Keeping right hip and knee in flexed position for comfort.  Skin:    General: Skin is warm and dry.     Coloration: Skin is not jaundiced or pale.  Neurological:     General: No focal deficit present.     Mental Status: He is alert. Mental status is at baseline. He is disoriented.  Psychiatric:        Mood and Affect: Mood normal.        Behavior: Behavior normal.     (all labs ordered are listed, but only abnormal results are displayed) Labs Reviewed  BASIC METABOLIC PANEL WITH GFR -  Abnormal; Notable for the following components:      Result Value   Glucose, Bld 224 (*)    BUN 25 (*)    Creatinine, Ser 1.60 (*)    GFR, Estimated 42 (*)    All other components within normal limits  CBC WITH DIFFERENTIAL/PLATELET - Abnormal; Notable for the following components:   Platelets 148 (*)    All other components within normal limits  MAGNESIUM    EKG: EKG Interpretation Date/Time:  Tuesday January 31 2024 16:12:52 EST Ventricular Rate:  78 PR Interval:  156 QRS Duration:  106 QT Interval:  412 QTC Calculation: 470 R Axis:   50  Text Interpretation: Sinus rhythm Nonspecific ST depression, anterior leads Confirmed by Melvenia Motto [phone]) on 01/31/2024 5:22:56 PM  Radiology: CT HEAD WO CONTRAST Result Date: 01/31/2024 EXAM: CT HEAD AND CERVICAL SPINE 01/31/2024 06:09:19 PM TECHNIQUE: CT of the head and cervical spine was performed without the administration of intravenous contrast. Multiplanar reformatted images are provided for review. Automated exposure control, iterative reconstruction, and/or weight based adjustment of the mA/kV was utilized to reduce the radiation dose to as low as reasonably achievable. COMPARISON: None available. CLINICAL HISTORY: Head trauma, minor (Age >= 65y) FINDINGS: CT HEAD BRAIN AND VENTRICLES: No acute intracranial hemorrhage. No mass effect or midline shift. No abnormal extra-axial fluid collection. No evidence of acute infarct. No hydrocephalus. ORBITS: No acute abnormality. SINUSES AND MASTOIDS: No acute abnormality. SOFT TISSUES AND SKULL: No acute skull fracture. No acute soft tissue abnormality. CT CERVICAL SPINE BONES AND ALIGNMENT: No acute fracture or traumatic malalignment. DEGENERATIVE CHANGES: Multilevel facet and uncovertebral hypertrophy with varying degrees of neural foraminal stenosis. SOFT TISSUES: No prevertebral soft tissue swelling. IMPRESSION: 1. No acute intracranial abnormality. 2. No acute fracture or traumatic malalignment of  the cervical spine. Electronically signed by: Gilmore Molt MD 01/31/2024 06:25 PM EST RP Workstation: [id number]   CT CERVICAL SPINE WO CONTRAST Result Date: 01/31/2024 EXAM: CT HEAD AND CERVICAL SPINE 01/31/2024 06:09:19 PM TECHNIQUE: CT of the head and cervical spine was performed without the administration of intravenous contrast. Multiplanar reformatted images are provided for review. Automated exposure control, iterative reconstruction, and/or weight based adjustment of the mA/kV was utilized to reduce the radiation dose to as low as reasonably achievable. COMPARISON: None available. CLINICAL HISTORY: Head trauma, minor (Age >= 65y) FINDINGS: CT HEAD BRAIN AND VENTRICLES: No acute intracranial hemorrhage. No mass effect or midline shift. No abnormal extra-axial fluid collection. No evidence of acute infarct. No hydrocephalus. ORBITS:  No acute abnormality. SINUSES AND MASTOIDS: No acute abnormality. SOFT TISSUES AND SKULL: No acute skull fracture. No acute soft tissue abnormality. CT CERVICAL SPINE BONES AND ALIGNMENT: No acute fracture or traumatic malalignment. DEGENERATIVE CHANGES: Multilevel facet and uncovertebral hypertrophy with varying degrees of neural foraminal stenosis. SOFT TISSUES: No prevertebral soft tissue swelling. IMPRESSION: 1. No acute intracranial abnormality. 2. No acute fracture or traumatic malalignment of the cervical spine. Electronically signed by: Gilmore Molt MD 01/31/2024 06:25 PM EST RP Workstation: [id number]   DG Hip Unilat W or Wo Pelvis 2-3 Views Right Result Date: 01/31/2024 CLINICAL DATA:  fall, right hip pain EXAM: DG HIP (WITH OR WITHOUT PELVIS) 2-3V RIGHT COMPARISON:  None Available. FINDINGS: Osteopenia.Mildly displaced subcapital fracture of the right femoral neck. 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 Aortoiliac and peripheral  vascular atherosclerosis. IMPRESSION: Mildly displaced, subcapital fracture of the right femoral neck. Electronically Signed   By: Rogelia Myers M.D.   On: 01/31/2024 17:48   DG Chest Portable 1 View Result Date: 01/31/2024 CLINICAL DATA:  fall , right hip pain EXAM: PORTABLE CHEST - 1 VIEW COMPARISON:  May 28, 2022 FINDINGS: Resolution of the biapical airspace disease on the prior study. Elevation of the right hemidiaphragm. No focal airspace consolidation, pleural effusion, or pneumothorax. Mild cardiomegaly. Tortuous aorta with aortic atherosclerosis. No acute fracture or destructive lesions. Multilevel thoracic osteophytosis. Bilateral AC joint osteoarthritis. Osteopenia. IMPRESSION: No acute cardiopulmonary abnormality. Electronically Signed   By: Rogelia Myers M.D.   On: 01/31/2024 17:45     Procedures   Medications Ordered in the ED  HYDROmorphone  (DILAUDID ) injection 0.5 mg (0.5 mg Intravenous Given 01/31/24 1608)  lisinopril  (ZESTRIL ) tablet 10 mg (10 mg Oral Given 01/31/24 1650)  nitroGLYCERIN  (NITROGLYN) 2 % ointment 1 inch (1 inch Topical Given 01/31/24 1649)  hydrALAZINE  (APRESOLINE ) injection 5 mg (5 mg Intravenous Given 01/31/24 1746)                                    Medical Decision Making Amount and/or Complexity of Data Reviewed Labs: ordered. Radiology: ordered.  Risk Prescription drug management.   This patient presents to the ED for concern of fall, this involves an extensive number of treatment options, and is a complaint that carries with it a high risk of complications and morbidity.  The differential diagnosis includes acute injuries   Co morbidities / Chronic conditions that complicate the patient evaluation  DM, neuropathy, PAD, CAD, CHF, CKD, HLD, anemia, dementia   Additional history obtained:  Additional history obtained from EMR External records from outside source obtained and reviewed including EMS, patient's daughter   Lab Tests:  I  Ordered, and personally interpreted labs.  The pertinent results include: Hemoglobin, no leukocytosis, creatinine slightly increased at baseline, normal electrolytes   Imaging Studies ordered:  I ordered imaging studies including x-ray of chest and right hip; CT of head and cervical spine I independently visualized and interpreted imaging which showed mildly displaced subcapital fracture of right femoral neck; no other acute findings I agree with the radiologist interpretation   Cardiac Monitoring: / EKG:  The patient was maintained on a cardiac monitor.  I personally viewed and interpreted the cardiac monitored which showed an underlying rhythm of: Sinus rhythm   Problem List / ED Course / Critical interventions / Medication management  Patient presents after a mechanical fall at home.  On arrival in the ED, he endorses pain in area of right hip.  He is keeping his right hip and knee in flexion to minimize discomfort.  Distal extremity is neurovascularly intact.  Patient does state that he struck his head but does not have any external evidence of head trauma.  Dilaudid  was ordered for analgesia.  Workup was initiated.  Patient does arrive hypertensive.  Per chart review, it does seem that he is only on a 5 mg dose of lisinopril .  10 mg dose of lisinopril  was ordered in addition to NTG.  Patient remained hypertensive and 5 mg of hydralazine  ordered.  X-ray of his right hip does confirm acute fracture.  I spoke with orthopedic surgeon on-call, Dr. Onesimo, who will see in consult.  On reassessment, patient resting comfortably.  Pain is controlled.  His blood pressure has improved to 160s SBP.  His daughter is present at bedside.  Patient and daughter were informed of x-ray findings.  Patient was admitted for further management. I ordered medication including Dilaudid  for analgesia, NTG, lisinopril , and hydralazine  for HTN Reevaluation of the patient after these medicines showed that the patient  improved I have reviewed the patients home medicines and have made adjustments as needed   Consultations Obtained:  I requested consultation with the orthopedic surgeon, Dr. Onesimo,  and discussed lab and imaging findings as well as pertinent plan - they recommend: Will see in consult   Social Determinants of Health:  Has dementia, lives at home with family     Final diagnoses:  Fall, initial encounter  Closed displaced fracture of right femoral neck [facility])    ED Discharge Orders     None          Melvenia Motto, MD 01/31/24 [phone]

## 2024-01-31 NOTE — H&P (Addendum)
 History and Physical    Wayne [name] FMW:[id number] DOB: 10-Dec-1940 DOA: 01/31/2024  PCP: Severa Rock HERO, FNP   Patient coming from: Home  I have personally briefly reviewed patient's old medical records in [facility]  Chief Complaint: Fall  HPI: Wayne [name] is a 83 y.o. male with medical history significant for diastolic CHF, CKD 3, coronary artery disease, diabetes mellitus, subdural hematoma, peripheral artery disease. Patient presented to the ED with reports of a fall.  Patient's daughter Wayne [name] is at bedside.  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  No chest pain no difficulty breathing.  Reports intermittent dizziness when standing over the past month.  He has maintained good oral intake.  No vomiting and no diarrhea.  ED Course: Temperature 97.9.  Heart rate 70s to 80s.  Respiratory rate 17-21.  Blood pressure systolic 150-227.  O2 sats greater than 93% on room air. Head cervical CT negative for acute abnormality. Chest x-ray without acute cardiopulmonary abnormality. Pelvic x-ray shows mildly displaced subcapital fracture of the right femoral neck. EDP talk to Dr. Onesimo, he will see in consult in AM.  Review of Systems: As per HPI all other systems reviewed and negative.  Past Medical History:  Diagnosis Date   Anemia of chronic disease    Chronic HFrEF (heart failure with reduced ejection fraction) (HCC)    Chronic kidney disease, stage 3a (HCC)    Coronary artery disease    Dr. Tisa years ago. No details.   Diabetes mellitus with circulatory complication (HCC)    Diabetic foot infection (HCC)    Encephalopathy    Former tobacco use    Hypertension    Lactic acidosis 11/28/2021   Multifocal pneumonia 11/28/2021   PAD (peripheral artery disease)    Peripheral neuropathy    SDH (subdural hematoma) (HCC)    Severe pulmonary  hypertension (HCC)     Past Surgical History:  Procedure Laterality Date   ABDOMINAL AORTOGRAM W/LOWER EXTREMITY Right 11/13/2021   Procedure: ABDOMINAL AORTOGRAM W/LOWER EXTREMITY;  Surgeon: Magda Debby SAILOR, MD;  Location: [facility];  Service: Cardiovascular;  Laterality: Right;   AMPUTATION Right 05/23/2022   Procedure: AMPUTATION RAY;  Surgeon: Malvin Marsa FALCON, DPM;  Location: [facility] OR;  Service: Podiatry;  Laterality: Right;  Partial 5th ray amputation   AMPUTATION Right 08/26/2022   Procedure: MIDFOOT AMPUTATION RIGHT FOOT, BONE BIOPSY RIGHT FOOT, TENDO ACHILIES LENGTHENING;  Surgeon: Janit Thresa HERO, DPM;  Location: [facility] OR;  Service: Podiatry;  Laterality: Right;   APPLICATION OF WOUND VAC Right 08/26/2022   Procedure: APPLICATION OF WOUND VAC;  Surgeon: Janit Thresa HERO, DPM;  Location: [facility] OR;  Service: Podiatry;  Laterality: Right;   CORONARY ANGIOPLASTY WITH STENT PLACEMENT     IR FLUORO GUIDE CV LINE RIGHT  08/27/2022   IR REMOVAL TUN CV CATH W/O FL  10/08/2022   IR US  GUIDE VASC ACCESS RIGHT  08/27/2022   IRRIGATION AND DEBRIDEMENT FOOT Right 05/25/2022   Procedure: IRRIGATION AND DEBRIDEMENT FOOT;  Surgeon: Gershon Donnice SAUNDERS, DPM;  Location: [facility] OR;  Service: Podiatry;  Laterality: Right;     reports that he quit smoking about 32 years ago. His smoking use included cigarettes. He has never used smokeless tobacco. He reports that he does not drink alcohol and does not use drugs.  No Known Allergies  Family History  Problem Relation Age of Onset   Cervical cancer Mother    Down syndrome Sister    Diabetes type II Neg Hx     Prior to Admission medications   Medication Sig Start Date End Date Taking? Authorizing Provider  furosemide  (LASIX ) 40 MG tablet Take 1 tablet (40 mg total) by mouth daily. **NEEDS TO BE SEEN BEFORE NEXT REFILL** 12/29/23  Yes Rakes, Rock HERO, FNP  glipiZIDE  (GLUCOTROL ) 5 MG tablet TAKE 1 TABLET (5 MG TOTAL) BY MOUTH DAILY BEFORE BREAKFAST. **NEEDS  TO BE SEEN BEFORE NEXT REFILL** 12/23/23  Yes Rakes, Rock HERO, FNP  Accu-Chek Softclix Lancets lancets Use 4 times daily as directed to check blood sugars. 11/16/21   Bryn Bernardino NOVAK, MD    Physical Exam: Vitals:   01/31/24 1630 01/31/24 1650 01/31/24 1715 01/31/24 1749  BP: (!) 220/93 (!) 220/93 (!) 204/85 (!) 177/82  Pulse: 79  80 83  Resp: (!) 21  19 18   Temp:      SpO2: 94%  93% 93%  Weight:      Height:        Constitutional: NAD, calm, comfortable Vitals:   01/31/24 1630 01/31/24 1650 01/31/24 1715 01/31/24 1749  BP: (!) 220/93 (!) 220/93 (!) 204/85 (!) 177/82  Pulse: 79  80 83  Resp: (!) 21  19 18   Temp:      SpO2: 94%  93% 93%  Weight:      Height:       Eyes: PERRL, lids and conjunctivae normal ENMT: Mucous membranes are moist.   Neck: normal, supple, no masses, no thyromegaly Respiratory: clear to auscultation bilaterally, no wheezing, no crackles. Normal respiratory effort. No accessory muscle use.  Cardiovascular: Regular rate and rhythm, no murmurs / rubs / gallops. No extremity edema.  Abdomen: no tenderness, no masses palpated. No hepatosplenomegaly. Bowel sounds positive.  Musculoskeletal: Ray amputation to right foot, no clubbing / cyanosis. Good ROM, no contractures. Normal muscle tone.  Skin: no rashes, lesions, ulcers. No induration Neurologic: No facial asymmetry, moving extremities spontaneously- RLE not tested, speech fluent. Psychiatric: Normal judgment and insight. Alert and oriented x 3. Normal mood.   Labs on Admission: I have personally reviewed following labs and imaging studies  CBC: Recent Labs  Lab 01/31/24 1638  WBC 7.2  NEUTROABS 5.4  HGB 16.1  HCT 45.7  MCV 86.1  PLT 148*   Basic Metabolic Panel: Recent Labs  Lab 01/31/24 1638  NA 139  K 3.8  CL 101  CO2 25  GLUCOSE 224*  BUN 25*  CREATININE 1.60*  CALCIUM  9.1  MG 2.4   Urine analysis:    Component Value Date/Time   COLORURINE YELLOW 11/28/2021 0949   APPEARANCEUR  Clear 03/11/2023 1503   LABSPEC 1.022 11/28/2021 0949   PHURINE 5.0 11/28/2021 0949   GLUCOSEU Negative 03/11/2023 1503   HGBUR SMALL (A) 11/28/2021 0949   BILIRUBINUR Negative 03/11/2023 1503   KETONESUR 20 (A) 11/28/2021 0949   PROTEINUR 1+ (A) 03/11/2023 1503   PROTEINUR 100 (A) 11/28/2021 0949   UROBILINOGEN 0.2 06/13/2012 0325   NITRITE Negative 03/11/2023 1503   NITRITE NEGATIVE 11/28/2021 0949   LEUKOCYTESUR Negative 03/11/2023 1503   LEUKOCYTESUR NEGATIVE 11/28/2021 0949    Radiological Exams on Admission: CT HEAD WO CONTRAST Result Date: 01/31/2024 EXAM: CT HEAD AND CERVICAL SPINE 01/31/2024 06:09:19 PM TECHNIQUE: CT of the head and cervical spine was performed without the administration of intravenous contrast. Multiplanar reformatted images are provided for review. Automated exposure  control, iterative reconstruction, and/or weight based adjustment of the mA/kV was utilized to reduce the radiation dose to as low as reasonably achievable. COMPARISON: None available. CLINICAL HISTORY: Head trauma, minor (Age >= 65y) FINDINGS: CT HEAD BRAIN AND VENTRICLES: No acute intracranial hemorrhage. No mass effect or midline shift. No abnormal extra-axial fluid collection. No evidence of acute infarct. No hydrocephalus. ORBITS: No acute abnormality. SINUSES AND MASTOIDS: No acute abnormality. SOFT TISSUES AND SKULL: No acute skull fracture. No acute soft tissue abnormality. CT CERVICAL SPINE BONES AND ALIGNMENT: No acute fracture or traumatic malalignment. DEGENERATIVE CHANGES: Multilevel facet and uncovertebral hypertrophy with varying degrees of neural foraminal stenosis. SOFT TISSUES: No prevertebral soft tissue swelling. IMPRESSION: 1. No acute intracranial abnormality. 2. No acute fracture or traumatic malalignment of the cervical spine. Electronically signed by: Gilmore Molt MD 01/31/2024 06:25 PM EST RP Workstation: [id number]   CT CERVICAL SPINE WO CONTRAST Result Date: 01/31/2024 EXAM:  CT HEAD AND CERVICAL SPINE 01/31/2024 06:09:19 PM TECHNIQUE: CT of the head and cervical spine was performed without the administration of intravenous contrast. Multiplanar reformatted images are provided for review. Automated exposure control, iterative reconstruction, and/or weight based adjustment of the mA/kV was utilized to reduce the radiation dose to as low as reasonably achievable. COMPARISON: None available. CLINICAL HISTORY: Head trauma, minor (Age >= 65y) FINDINGS: CT HEAD BRAIN AND VENTRICLES: No acute intracranial hemorrhage. No mass effect or midline shift. No abnormal extra-axial fluid collection. No evidence of acute infarct. No hydrocephalus. ORBITS: No acute abnormality. SINUSES AND MASTOIDS: No acute abnormality. SOFT TISSUES AND SKULL: No acute skull fracture. No acute soft tissue abnormality. CT CERVICAL SPINE BONES AND ALIGNMENT: No acute fracture or traumatic malalignment. DEGENERATIVE CHANGES: Multilevel facet and uncovertebral hypertrophy with varying degrees of neural foraminal stenosis. SOFT TISSUES: No prevertebral soft tissue swelling. IMPRESSION: 1. No acute intracranial abnormality. 2. No acute fracture or traumatic malalignment of the cervical spine. Electronically signed by: Gilmore Molt MD 01/31/2024 06:25 PM EST RP Workstation: [id number]   DG Hip Unilat W or Wo Pelvis 2-3 Views Right Result Date: 01/31/2024 CLINICAL DATA:  fall, right hip pain EXAM: DG HIP (WITH OR WITHOUT PELVIS) 2-3V RIGHT COMPARISON:  None Available. FINDINGS: Osteopenia.Mildly displaced subcapital fracture of the right femoral neck. 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 Aortoiliac and peripheral vascular atherosclerosis. IMPRESSION: Mildly displaced, subcapital fracture of the right femoral neck. Electronically Signed   By: Rogelia Myers M.D.   On: 01/31/2024 17:48   DG Chest  Portable 1 View Result Date: 01/31/2024 CLINICAL DATA:  fall , right hip pain EXAM: PORTABLE CHEST - 1 VIEW COMPARISON:  May 28, 2022 FINDINGS: Resolution of the biapical airspace disease on the prior study. Elevation of the right hemidiaphragm. No focal airspace consolidation, pleural effusion, or pneumothorax. Mild cardiomegaly. Tortuous aorta with aortic atherosclerosis. No acute fracture or destructive lesions. Multilevel thoracic osteophytosis. Bilateral AC joint osteoarthritis. Osteopenia. IMPRESSION: No acute cardiopulmonary abnormality. Electronically Signed   By: Rogelia Myers M.D.   On: 01/31/2024 17:45   EKG: Independently reviewed.  Sinus rhythm, rate 78, QTc 470.  No significant change.  Assessment/Plan Principal Problem:   Closed right hip fracture (HCC)   Assessment and Plan:  Close right hip fracture s/p mechanical fall-pelvic x-ray - mildly displaced, subcapital fracture of the right femoral neck.  Reports intermittent orthostatic dizziness.  Head CT cervical CT, chest x-ray negative for acute abnormality.  EKG shows sinus rhythm without significant change.. - EDP talked to Dr. Onesimo, will see in consult in a.m. - IV Dilaudid  0.5 mg every 4 hours as needed - N.p.o. midnight - Family requesting [facility] for rehab stay after surgery  Elevated blood pressure-systolic 150s to 772. - As needed labetalol  10mg  for systolic greater than 160 - Awaiting med reconciliation  Diastolic CHF-stable and compensated.  No sign of volume overload.  Last echo 04/2022 EF of 50 to 55%, G2DD. - Resume Lasix   Diabetes mellitus-  - HgbA1c - SSI- S - Hold glipizide   CKD stage III A/B- creatinine 1.6.  Coronary artery disease-stable.  EKG unchanged.  No chest pain.  Evaluated by Dr. Lavona 07/2022, no details of CAD history.  Peripheral artery disease- s/p right ray amputation.   DVT prophylaxis: SCDs Code Status: DNR-ACP documents reviewed Family Communication: Daughter Wayne [name] at  bedside Disposition Plan: > 2 days Consults called: Ortho Admission status: inpt tele I certify that at the point of admission it is my clinical judgment that the patient will require inpatient hospital care spanning beyond 2 midnights from the point of admission due to high intensity of service, high risk for further deterioration and high frequency of surveillance required.   Author: Tully FORBES Carwin, MD 01/31/2024 8:41 PM  For on call review [email or web address].

## 2024-01-31 NOTE — ED Triage Notes (Signed)
 Pt BIB [facility] due to fall. Pt c/o right hip pain after falling, he states he hit is head but denies any loc.

## 2024-01-31 NOTE — ED Notes (Signed)
 Pt returned from Radiology.

## 2024-02-01 DIAGNOSIS — S72001D Fracture of unspecified part of neck of right femur, subsequent encounter for closed fracture with routine healing: Secondary | ICD-10-CM

## 2024-02-01 DIAGNOSIS — I10 Essential (primary) hypertension: Secondary | ICD-10-CM | POA: Diagnosis not present

## 2024-02-01 DIAGNOSIS — S72001A Fracture of unspecified part of neck of right femur, initial encounter for closed fracture: Secondary | ICD-10-CM | POA: Diagnosis not present

## 2024-02-01 LAB — CBC
HCT: 41.2 % (ref 39.0–52.0)
Hemoglobin: 14.4 g/dL (ref 13.0–17.0)
MCH: 30.1 pg (ref 26.0–34.0)
MCHC: 35 g/dL (ref 30.0–36.0)
MCV: 86 fL (ref 80.0–100.0)
Platelets: 144 K/uL — ABNORMAL LOW (ref 150–400)
RBC: 4.79 MIL/uL (ref 4.22–5.81)
RDW: 12.4 % (ref 11.5–15.5)
WBC: 9.6 K/uL (ref 4.0–10.5)
nRBC: 0 % (ref 0.0–0.2)

## 2024-02-01 LAB — BASIC METABOLIC PANEL WITH GFR
Anion gap: 13 (ref 5–15)
BUN: 28 mg/dL — ABNORMAL HIGH (ref 8–23)
CO2: 26 mmol/L (ref 22–32)
Calcium: 8.6 mg/dL — ABNORMAL LOW (ref 8.9–10.3)
Chloride: 103 mmol/L (ref 98–111)
Creatinine, Ser: 1.67 mg/dL — ABNORMAL HIGH (ref 0.61–1.24)
GFR, Estimated: 40 mL/min — ABNORMAL LOW (ref >60)
Glucose, Bld: 150 mg/dL — ABNORMAL HIGH (ref 70–99)
Potassium: 3.4 mmol/L — ABNORMAL LOW (ref 3.5–5.1)
Sodium: 142 mmol/L (ref 135–145)

## 2024-02-01 LAB — HEMOGLOBIN A1C
Hgb A1c MFr Bld: 7.6 % — ABNORMAL HIGH (ref 4.8–5.6)
Mean Plasma Glucose: 171.42 mg/dL

## 2024-02-01 LAB — GLUCOSE, CAPILLARY
Glucose-Capillary: 165 mg/dL — ABNORMAL HIGH (ref 70–99)
Glucose-Capillary: 169 mg/dL — ABNORMAL HIGH (ref 70–99)
Glucose-Capillary: 175 mg/dL — ABNORMAL HIGH (ref 70–99)
Glucose-Capillary: 177 mg/dL — ABNORMAL HIGH (ref 70–99)

## 2024-02-01 MED ORDER — CEFAZOLIN SODIUM-DEXTROSE 2-4 GM/100ML-% IV SOLN
2.0000 g | INTRAVENOUS | Status: AC
  Administered 2024-02-02: 2 g via INTRAVENOUS
  Filled 2024-02-01: qty 100

## 2024-02-01 MED ORDER — TRANEXAMIC ACID-NACL 1000-0.7 MG/100ML-% IV SOLN
1000.0000 mg | INTRAVENOUS | Status: AC
  Administered 2024-02-02: 1000 mg via INTRAVENOUS
  Filled 2024-02-01: qty 100

## 2024-02-01 MED ORDER — INSULIN ASPART 100 UNIT/ML IJ SOLN
0.0000 [IU] | Freq: Four times a day (QID) | INTRAMUSCULAR | Status: DC
  Administered 2024-02-01 (×2): 2 [IU] via SUBCUTANEOUS
  Administered 2024-02-02 (×2): 3 [IU] via SUBCUTANEOUS
  Administered 2024-02-02: 2 [IU] via SUBCUTANEOUS
  Administered 2024-02-03: 3 [IU] via SUBCUTANEOUS
  Filled 2024-02-01 (×6): qty 1

## 2024-02-01 MED ORDER — POTASSIUM CHLORIDE CRYS ER 20 MEQ PO TBCR
20.0000 meq | EXTENDED_RELEASE_TABLET | Freq: Once | ORAL | Status: AC
  Administered 2024-02-01: 20 meq via ORAL
  Filled 2024-02-01: qty 1

## 2024-02-01 MED ORDER — AMLODIPINE BESYLATE 5 MG PO TABS
5.0000 mg | ORAL_TABLET | Freq: Every day | ORAL | Status: DC
  Administered 2024-02-01 – 2024-02-04 (×3): 5 mg via ORAL
  Filled 2024-02-01 (×3): qty 1

## 2024-02-01 MED ORDER — HYDROMORPHONE HCL 1 MG/ML IJ SOLN: 0.2500 mg | INTRAMUSCULAR | Status: DC | PRN

## 2024-02-01 MED ORDER — KETOROLAC TROMETHAMINE 15 MG/ML IJ SOLN
15.0000 mg | Freq: Once | INTRAMUSCULAR | Status: AC
  Administered 2024-02-01: 15 mg via INTRAVENOUS
  Filled 2024-02-01: qty 1

## 2024-02-01 MED ORDER — OXYCODONE HCL 5 MG PO TABS: 5.0000 mg | ORAL_TABLET | ORAL | Status: DC | PRN

## 2024-02-01 MED ORDER — HYDROMORPHONE HCL 1 MG/ML IJ SOLN
0.2500 mg | INTRAMUSCULAR | Status: DC | PRN
  Administered 2024-02-01 – 2024-02-03 (×7): 0.25 mg via INTRAVENOUS
  Filled 2024-02-01 (×8): qty 0.5

## 2024-02-01 NOTE — Plan of Care (Signed)

## 2024-02-01 NOTE — Progress Notes (Signed)
 PROGRESS NOTE   Wayne [name]  FMW:[id number] DOB: 12-15-1940 DOA: 01/31/2024 PCP: Severa Rock HERO, FNP   Chief Complaint  Patient presents with   Fall   Level of care: Telemetry  Brief Admission History:  83 y.o. male with medical history significant for diastolic CHF, CKD 3, coronary artery disease, diabetes mellitus, subdural hematoma, peripheral artery disease. Patient presented to the ED with reports of a fall.  Patient's daughter [name] is at bedside.  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  No chest pain no difficulty breathing.  Reports intermittent dizziness when standing over the past month.  He has maintained good oral intake.  No vomiting and no diarrhea.   ED Course: Temperature 97.9.  Heart rate 70s to 80s.  Respiratory rate 17-21.  Blood pressure systolic 150-227.  O2 sats greater than 93% on room air. Head cervical CT negative for acute abnormality.  Chest x-ray without acute cardiopulmonary abnormality.  Pelvic x-ray shows mildly displaced subcapital fracture of the right femoral neck.  EDP talk to Dr. Onesimo, he will see in consultation.     Assessment and Plan:  Closed right hip fracture s/p mechanical fall -pelvic x-ray - mildly displaced, subcapital fracture of the right femoral neck.  Reports intermittent orthostatic dizziness.  Head CT cervical CT, chest x-ray negative for acute abnormality.  EKG shows sinus rhythm without significant change.. - Dr. Onesimo planning to take him to the OR on 11/6 - IV pain meds as needed - N.p.o. at midnight - Family requesting [facility] for rehab stay after surgery if recommended by PT/OT    Primary HTN  - As needed labetalol  10mg  for systolic greater than 160 - Added amlodipine 5 mg daily    Diastolic CHF-stable and compensated.  No sign of volume overload.  Last echo 04/2022 EF of 50 to 55%, G2DD. -  Resumed Lasix  daily    Uncontrolled Diabetes mellitus, type 2 with vascular complications  - HgbA1c - 7.6%  - SSI- S - Hold glipizide  while inpatient  CBG (last 3)  Recent Labs    01/31/24 2317 02/01/24 0516 02/01/24 1148  GLUCAP 259* 169* 177*    CKD stage III A/B- creatinine 1.6   Coronary artery disease-stable.  EKG unchanged.  No chest pain.  Evaluated by Dr. Lavona 07/2022, no details of CAD history.   Peripheral artery disease- s/p right ray amputation.   DVT prophylaxis: SCDs Code Status: DNR  Family Communication:  Disposition: TBD    Consultants:  Orthopedics  Procedures:  Pending ORIF planned for 11/6  Antimicrobials:    Subjective: Pt is reporting ongoing pain but better than yesterday.  He would like to start eating.  No other complaints.    Objective: Vitals:   01/31/24 2037 01/31/24 2321 02/01/24 0346 02/01/24 0814  BP:  (!) 120/58 (!) 144/58 (!) 157/68  Pulse:  75 70 70  Resp:  17 19 17   Temp:  97.6 F (36.4 C) (!) 97.4 F (36.3 C) 97.6 F (36.4 C)  TempSrc:  Oral Oral Oral  SpO2: 93% 93% 91% 92%  Weight:      Height:        Intake/Output Summary (Last 24 hours) at 02/01/2024 1231 Last data filed at 02/01/2024 0345 Gross per 24 hour  Intake 240 ml  Output 500 ml  Net -260 ml   [employer]  01/31/24 1549 01/31/24 2008  Weight: 82.5 kg 76.4 kg   Examination:  General exam: Appears calm and comfortable  Respiratory system: Clear to auscultation. Respiratory effort normal. Cardiovascular system: normal S1 & S2 heard. No JVD, murmurs, rubs, gallops or clicks. No pedal edema. Gastrointestinal system: Abdomen is nondistended, soft and nontender. No organomegaly or masses felt. Normal bowel sounds heard. Central nervous system: Alert and oriented. No focal neurological deficits. Extremities: s/p right foot ray amputation, right knee/hip flexed. Palpable pulses in both lower extremities.  Skin: No rashes, lesions or ulcers. Psychiatry:  Judgement and insight appear normal. Mood & affect appropriate.   Data Reviewed: I have personally reviewed following labs and imaging studies  CBC: Recent Labs  Lab 01/31/24 1638 02/01/24 0443  WBC 7.2 9.6  NEUTROABS 5.4  --   HGB 16.1 14.4  HCT 45.7 41.2  MCV 86.1 86.0  PLT 148* 144*    Basic Metabolic Panel: Recent Labs  Lab 01/31/24 1638 02/01/24 0443  NA 139 142  K 3.8 3.4*  CL 101 103  CO2 25 26  GLUCOSE 224* 150*  BUN 25* 28*  CREATININE 1.60* 1.67*  CALCIUM  9.1 8.6*  MG 2.4  --     CBG: Recent Labs  Lab 01/31/24 2317 02/01/24 0516 02/01/24 1148  GLUCAP 259* 169* 177*    No results found for this or any previous visit (from the past 240 hours).   Radiology Studies: CT HEAD WO CONTRAST Result Date: 01/31/2024 EXAM: CT HEAD AND CERVICAL SPINE 01/31/2024 06:09:19 PM TECHNIQUE: CT of the head and cervical spine was performed without the administration of intravenous contrast. Multiplanar reformatted images are provided for review. Automated exposure control, iterative reconstruction, and/or weight based adjustment of the mA/kV was utilized to reduce the radiation dose to as low as reasonably achievable. COMPARISON: None available. CLINICAL HISTORY: Head trauma, minor (Age >= 65y) FINDINGS: CT HEAD BRAIN AND VENTRICLES: No acute intracranial hemorrhage. No mass effect or midline shift. No abnormal extra-axial fluid collection. No evidence of acute infarct. No hydrocephalus. ORBITS: No acute abnormality. SINUSES AND MASTOIDS: No acute abnormality. SOFT TISSUES AND SKULL: No acute skull fracture. No acute soft tissue abnormality. CT CERVICAL SPINE BONES AND ALIGNMENT: No acute fracture or traumatic malalignment. DEGENERATIVE CHANGES: Multilevel facet and uncovertebral hypertrophy with varying degrees of neural foraminal stenosis. SOFT TISSUES: No prevertebral soft tissue swelling. IMPRESSION: 1. No acute intracranial abnormality. 2. No acute fracture or traumatic  malalignment of the cervical spine. Electronically signed by: Gilmore Molt MD 01/31/2024 06:25 PM EST RP Workstation: [id number]   CT CERVICAL SPINE WO CONTRAST Result Date: 01/31/2024 EXAM: CT HEAD AND CERVICAL SPINE 01/31/2024 06:09:19 PM TECHNIQUE: CT of the head and cervical spine was performed without the administration of intravenous contrast. Multiplanar reformatted images are provided for review. Automated exposure control, iterative reconstruction, and/or weight based adjustment of the mA/kV was utilized to reduce the radiation dose to as low as reasonably achievable. COMPARISON: None available. CLINICAL HISTORY: Head trauma, minor (Age >= 65y) FINDINGS: CT HEAD BRAIN AND VENTRICLES: No acute intracranial hemorrhage. No mass effect or midline shift. No abnormal extra-axial fluid collection. No evidence of acute infarct. No hydrocephalus. ORBITS: No acute abnormality. SINUSES AND MASTOIDS: No acute abnormality. SOFT TISSUES AND SKULL: No acute skull fracture. No acute soft tissue abnormality. CT CERVICAL SPINE BONES AND ALIGNMENT: No acute fracture or traumatic malalignment. DEGENERATIVE CHANGES: Multilevel facet and uncovertebral hypertrophy with varying degrees of neural foraminal stenosis. SOFT TISSUES: No prevertebral soft tissue swelling. IMPRESSION:  1. No acute intracranial abnormality. 2. No acute fracture or traumatic malalignment of the cervical spine. Electronically signed by: Gilmore Molt MD 01/31/2024 06:25 PM EST RP Workstation: [id number]   DG Hip Unilat W or Wo Pelvis 2-3 Views Right Result Date: 01/31/2024 CLINICAL DATA:  fall, right hip pain EXAM: DG HIP (WITH OR WITHOUT PELVIS) 2-3V RIGHT COMPARISON:  None Available. FINDINGS: Osteopenia.Mildly displaced subcapital fracture of the right femoral neck. 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 Aortoiliac and  peripheral vascular atherosclerosis. IMPRESSION: Mildly displaced, subcapital fracture of the right femoral neck. Electronically Signed   By: Rogelia Myers M.D.   On: 01/31/2024 17:48   DG Chest Portable 1 View Result Date: 01/31/2024 CLINICAL DATA:  fall , right hip pain EXAM: PORTABLE CHEST - 1 VIEW COMPARISON:  May 28, 2022 FINDINGS: Resolution of the biapical airspace disease on the prior study. Elevation of the right hemidiaphragm. No focal airspace consolidation, pleural effusion, or pneumothorax. Mild cardiomegaly. Tortuous aorta with aortic atherosclerosis. No acute fracture or destructive lesions. Multilevel thoracic osteophytosis. Bilateral AC joint osteoarthritis. Osteopenia. IMPRESSION: No acute cardiopulmonary abnormality. Electronically Signed   By: Rogelia Myers M.D.   On: 01/31/2024 17:45   Scheduled Meds:  furosemide   40 mg Oral Daily   insulin  aspart  0-9 Units Subcutaneous Q6H   Continuous Infusions:   LOS: 1 day   Time spent: 55 mins  Shakima Nisley Vicci, MD How to contact the [facility] Attending or Consulting provider 7A - 7P or covering provider during after hours 7P -7A, for this patient?  Check the care team in [facility] and look for a) attending/consulting TR[facility] provider listed and b) the TRH team listed Log into [email or web address] to find provider on call.  Locate the TR[facility] provider you are looking for under [facility] and page to a number that you can be directly reached. If you still have difficulty reaching the provider, please page the [facility] (Director on Call) for the Hospitalists listed on amion for assistance.  02/01/2024, 12:31 PM

## 2024-02-01 NOTE — TOC CM/SW Note (Signed)
 Transition of Care [facility]) - Inpatient Brief Assessment   Patient Details  Name: Wayne [name] MRN: [id number] Date of Birth: 1940-11-15  Transition of [facility] CM/SW Contact:    Lucie Lunger, LCSWA Phone Number: 02/01/2024, 8:50 AM   Clinical Narrative: CSW notes per chart review that pt and family are requesting SNF at [facility] when pt is stable for D/C. CSW notes that ortho has been consulted. At this time there is no PT eval ordered. CSW to follow for PT recommendation after ortho has consulted. TOC to follow.   Transition of Care Asessment: Insurance and Status: Insurance coverage has been reviewed Patient has primary care physician: Yes Home environment has been reviewed: From home Prior level of function:: Independent Prior/Current Home Services: No current home services Social Drivers of Health Review: SDOH reviewed no interventions necessary Readmission risk has been reviewed: Yes Transition of care needs: transition of care needs identified, TOC will continue to follow

## 2024-02-01 NOTE — Consult Note (Signed)
 ORTHOPAEDIC CONSULTATION  REQUESTING PHYSICIAN: [name], MD  ASSESSMENT AND PLAN: 83 y.o. male with the following: Right Hip Displaced femoral neck fracture  This patient requires inpatient admission to the hospitalist, to include preoperative clearance and perioperative medical management  - Weight Bearing Status/Activity: NWB Right lower extremity  - Additional recommended labs/tests: Preop Labs: CBC, BMP, PT/INR, Chest XR, and EKG  -VTE Prophylaxis: Please hold prior to OR; to resume POD#1 at the discretion of the primary team  - Pain control: Recommend PO pain medications PRN; judicious use of narcotics  - Follow-up plan: F/u 10-14 days postop  -Procedures: Plan for OR once patient has been medically optimized  Plan for Right Hip hemiarthroplasty  OR 02/02/24, NPO at midnight.      Chief Complaint: Right hip pain  HPI: Wayne [name] is a 83 y.o. male who presented to the ED for evaluation after sustaining a mechanical fall.  He uses a walker at baseline.  He was ambulating in his home when he lost his balance.  He landed on his right side.  He reportedly hit his head.  No LOC.  No blood thinners.  Complaining of right hip pain.   Past Medical History:  Diagnosis Date   Anemia of chronic disease    Chronic HFrEF (heart failure with reduced ejection fraction) (HCC)    Chronic kidney disease, stage 3a (HCC)    Coronary artery disease    Dr. Tisa years ago. No details.   Diabetes mellitus with circulatory complication (HCC)    Diabetic foot infection (HCC)    Encephalopathy    Former tobacco use    Hypertension    Lactic acidosis 11/28/2021   Multifocal pneumonia 11/28/2021   PAD (peripheral artery disease)    Peripheral neuropathy    SDH (subdural hematoma) (HCC)    Severe pulmonary hypertension (HCC)    Past Surgical History:  Procedure Laterality Date   ABDOMINAL AORTOGRAM W/LOWER EXTREMITY Right 11/13/2021   Procedure: ABDOMINAL AORTOGRAM  W/LOWER EXTREMITY;  Surgeon: Magda Debby SAILOR, MD;  Location: [facility];  Service: Cardiovascular;  Laterality: Right;   AMPUTATION Right 05/23/2022   Procedure: AMPUTATION RAY;  Surgeon: Malvin Marsa FALCON, DPM;  Location: [facility] OR;  Service: Podiatry;  Laterality: Right;  Partial 5th ray amputation   AMPUTATION Right 08/26/2022   Procedure: MIDFOOT AMPUTATION RIGHT FOOT, BONE BIOPSY RIGHT FOOT, TENDO ACHILIES LENGTHENING;  Surgeon: Janit Thresa HERO, DPM;  Location: [facility] OR;  Service: Podiatry;  Laterality: Right;   APPLICATION OF WOUND VAC Right 08/26/2022   Procedure: APPLICATION OF WOUND VAC;  Surgeon: Janit Thresa HERO, DPM;  Location: [facility] OR;  Service: Podiatry;  Laterality: Right;   CORONARY ANGIOPLASTY WITH STENT PLACEMENT     IR FLUORO GUIDE CV LINE RIGHT  08/27/2022   IR REMOVAL TUN CV CATH W/O FL  10/08/2022   IR US  GUIDE VASC ACCESS RIGHT  08/27/2022   IRRIGATION AND DEBRIDEMENT FOOT Right 05/25/2022   Procedure: IRRIGATION AND DEBRIDEMENT FOOT;  Surgeon: Gershon Donnice SAUNDERS, DPM;  Location: [facility] OR;  Service: Podiatry;  Laterality: Right;   Social History   Socioeconomic History   Marital status: Divorced    Spouse name: Not on file   Number of children: Not on file   Years of education: Not on file   Highest education level: Not on file  Occupational History   Not on file  Tobacco Use   Smoking status: Former    Current packs/day: 0.00  Types: Cigarettes    Quit date: 23    Years since quitting: 32.8   Smokeless tobacco: Never  Vaping Use   Vaping status: Never Used  Substance and Sexual Activity   Alcohol use: No   Drug use: No   Sexual activity: Not Currently  Other Topics Concern   Not on file  Social History Narrative   Not on file   Social Drivers of Health   Financial Resource Strain: Low Risk  (05/11/2023)   Overall Financial Resource Strain (CARDIA)    Difficulty of Paying Living Expenses: Not hard at all  Food Insecurity: No Food Insecurity  (01/31/2024)   Hunger Vital Sign    Worried About Running Out of Food in the Last Year: Never true    Ran Out of Food in the Last Year: Never true  Transportation Needs: No Transportation Needs (01/31/2024)   PRAPARE - Administrator, Civil Service (Medical): No    Lack of Transportation (Non-Medical): No  Physical Activity: Inactive (05/11/2023)   Exercise Vital Sign    Days of Exercise per Week: 0 days    Minutes of Exercise per Session: 0 min  Stress: No Stress Concern Present (05/11/2023)   [employer] of Occupational Health - Occupational Stress Questionnaire    Feeling of Stress : Not at all  Social Connections: Socially Isolated (01/31/2024)   Social Connection and Isolation Panel    Frequency of Communication with Friends and Family: More than three times a week    Frequency of Social Gatherings with Friends and Family: More than three times a week    Attends Religious Services: Never    Database Administrator or Organizations: No    Attends Engineer, Structural: Never    Marital Status: Divorced   Family History  Problem Relation Age of Onset   Cervical cancer Mother    Down syndrome Sister    Diabetes type II Neg Hx    No Known Allergies Prior to Admission medications   Medication Sig Start Date End Date Taking? Authorizing Provider  furosemide  (LASIX ) 40 MG tablet Take 1 tablet (40 mg total) by mouth daily. **NEEDS TO BE SEEN BEFORE NEXT REFILL** 12/29/23  Yes Rakes, Rock HERO, FNP  glipiZIDE  (GLUCOTROL ) 5 MG tablet TAKE 1 TABLET (5 MG TOTAL) BY MOUTH DAILY BEFORE BREAKFAST. **NEEDS TO BE SEEN BEFORE NEXT REFILL** 12/23/23  Yes Rakes, Rock HERO, FNP  Accu-Chek Softclix Lancets lancets Use 4 times daily as directed to check blood sugars. 11/16/21   Bryn Bernardino NOVAK, MD   CT HEAD WO CONTRAST Result Date: 01/31/2024 EXAM: CT HEAD AND CERVICAL SPINE 01/31/2024 06:09:19 PM TECHNIQUE: CT of the head and cervical spine was performed without the administration of  intravenous contrast. Multiplanar reformatted images are provided for review. Automated exposure control, iterative reconstruction, and/or weight based adjustment of the mA/kV was utilized to reduce the radiation dose to as low as reasonably achievable. COMPARISON: None available. CLINICAL HISTORY: Head trauma, minor (Age >= 65y) FINDINGS: CT HEAD BRAIN AND VENTRICLES: No acute intracranial hemorrhage. No mass effect or midline shift. No abnormal extra-axial fluid collection. No evidence of acute infarct. No hydrocephalus. ORBITS: No acute abnormality. SINUSES AND MASTOIDS: No acute abnormality. SOFT TISSUES AND SKULL: No acute skull fracture. No acute soft tissue abnormality. CT CERVICAL SPINE BONES AND ALIGNMENT: No acute fracture or traumatic malalignment. DEGENERATIVE CHANGES: Multilevel facet and uncovertebral hypertrophy with varying degrees of neural foraminal stenosis. SOFT TISSUES: No prevertebral soft tissue  swelling. IMPRESSION: 1. No acute intracranial abnormality. 2. No acute fracture or traumatic malalignment of the cervical spine. Electronically signed by: Gilmore Molt MD 01/31/2024 06:25 PM EST RP Workstation: [id number]   CT CERVICAL SPINE WO CONTRAST Result Date: 01/31/2024 EXAM: CT HEAD AND CERVICAL SPINE 01/31/2024 06:09:19 PM TECHNIQUE: CT of the head and cervical spine was performed without the administration of intravenous contrast. Multiplanar reformatted images are provided for review. Automated exposure control, iterative reconstruction, and/or weight based adjustment of the mA/kV was utilized to reduce the radiation dose to as low as reasonably achievable. COMPARISON: None available. CLINICAL HISTORY: Head trauma, minor (Age >= 65y) FINDINGS: CT HEAD BRAIN AND VENTRICLES: No acute intracranial hemorrhage. No mass effect or midline shift. No abnormal extra-axial fluid collection. No evidence of acute infarct. No hydrocephalus. ORBITS: No acute abnormality. SINUSES AND MASTOIDS: No  acute abnormality. SOFT TISSUES AND SKULL: No acute skull fracture. No acute soft tissue abnormality. CT CERVICAL SPINE BONES AND ALIGNMENT: No acute fracture or traumatic malalignment. DEGENERATIVE CHANGES: Multilevel facet and uncovertebral hypertrophy with varying degrees of neural foraminal stenosis. SOFT TISSUES: No prevertebral soft tissue swelling. IMPRESSION: 1. No acute intracranial abnormality. 2. No acute fracture or traumatic malalignment of the cervical spine. Electronically signed by: Gilmore Molt MD 01/31/2024 06:25 PM EST RP Workstation: [id number]   DG Hip Unilat W or Wo Pelvis 2-3 Views Right Result Date: 01/31/2024 CLINICAL DATA:  fall, right hip pain EXAM: DG HIP (WITH OR WITHOUT PELVIS) 2-3V RIGHT COMPARISON:  None Available. FINDINGS: Osteopenia.Mildly displaced subcapital fracture of the right femoral neck. 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 Aortoiliac and peripheral vascular atherosclerosis. IMPRESSION: Mildly displaced, subcapital fracture of the right femoral neck. Electronically Signed   By: Rogelia Myers M.D.   On: 01/31/2024 17:48   DG Chest Portable 1 View Result Date: 01/31/2024 CLINICAL DATA:  fall , right hip pain EXAM: PORTABLE CHEST - 1 VIEW COMPARISON:  May 28, 2022 FINDINGS: Resolution of the biapical airspace disease on the prior study. Elevation of the right hemidiaphragm. No focal airspace consolidation, pleural effusion, or pneumothorax. Mild cardiomegaly. Tortuous aorta with aortic atherosclerosis. No acute fracture or destructive lesions. Multilevel thoracic osteophytosis. Bilateral AC joint osteoarthritis. Osteopenia. IMPRESSION: No acute cardiopulmonary abnormality. Electronically Signed   By: Rogelia Myers M.D.   On: 01/31/2024 17:45   Family History Reviewed and non-contributory, no pertinent history of problems with bleeding  or anesthesia    Review of Systems Unable to assess    OBJECTIVE  Vitals:Patient Vitals for the past 8 hrs:  BP Temp Temp src Pulse Resp SpO2  02/01/24 0814 (!) 157/68 97.6 F (36.4 C) Oral 70 17 92 %   General: Alert, no acute distress Cardiovascular: Extremities are warm Respiratory: No cyanosis, no use of accessory musculature Skin: No lesions in the area of chief complaint  Neurologic: Sensation intact distally  Psychiatric: Pleasant.  Answers some questions appropriately, but not all questions.  Lymphatic: No swelling obvious and reported other than the area involved in the exam below Extremities  RLE: Extremity held in a fixed position.  ROM deferred due to known fracture.  Sensation is intact to dorsum of foot.  Well head midfoot amputation.  No redness.  No skin breakdown.  LLE: Sensation is intact distally in the sural, saphenous, DP, SP, and plantar nerve distribution. 2+ DP pulse.  Toes are WWP.  Active motion intact in the  TA/EHL/GS. Tolerates gentle ROM of the hip.  No pain with axial loading.     Test Results Imaging XR of the Right hip demonstrates a Displaced femoral neck fracture.  Labs cbc Recent Labs    01/31/24 1638 02/01/24 0443  WBC 7.2 9.6  HGB 16.1 14.4  HCT 45.7 41.2  PLT 148* 144*     Recent Labs    01/31/24 1638 02/01/24 0443  NA 139 142  K 3.8 3.4*  CL 101 103  CO2 25 26  GLUCOSE 224* 150*  BUN 25* 28*  CREATININE 1.60* 1.67*  CALCIUM  9.1 8.6*

## 2024-02-01 NOTE — Hospital Course (Signed)
 83 y.o. male with medical history significant for diastolic CHF, CKD 3, coronary artery disease, diabetes mellitus, subdural hematoma, peripheral artery disease. Patient presented to the ED with reports of a fall.  Patient's daughter [name] is at bedside.  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  No chest pain no difficulty breathing.  Reports intermittent dizziness when standing over the past month.  He has maintained good oral intake.  No vomiting and no diarrhea.   ED Course: Temperature 97.9.  Heart rate 70s to 80s.  Respiratory rate 17-21.  Blood pressure systolic 150-227.  O2 sats greater than 93% on room air. Head cervical CT negative for acute abnormality.  Chest x-ray without acute cardiopulmonary abnormality.  Pelvic x-ray shows mildly displaced subcapital fracture of the right femoral neck.  EDP talk to Dr. Onesimo, he will see in consultation.

## 2024-02-02 ENCOUNTER — Inpatient Hospital Stay (HOSPITAL_COMMUNITY)

## 2024-02-02 ENCOUNTER — Encounter (HOSPITAL_COMMUNITY): Payer: Self-pay | Admitting: Internal Medicine

## 2024-02-02 ENCOUNTER — Encounter (HOSPITAL_COMMUNITY)
Admission: EM | Disposition: A | Discharge status: Skilled Nursing Facility | Payer: Self-pay | Source: Home / Self Care | Attending: Family Medicine

## 2024-02-02 ENCOUNTER — Inpatient Hospital Stay (HOSPITAL_COMMUNITY): Admitting: Certified Registered"

## 2024-02-02 DIAGNOSIS — I5032 Chronic diastolic (congestive) heart failure: Secondary | ICD-10-CM | POA: Diagnosis not present

## 2024-02-02 DIAGNOSIS — S72001A Fracture of unspecified part of neck of right femur, initial encounter for closed fracture: Secondary | ICD-10-CM | POA: Diagnosis not present

## 2024-02-02 DIAGNOSIS — S72001D Fracture of unspecified part of neck of right femur, subsequent encounter for closed fracture with routine healing: Secondary | ICD-10-CM | POA: Diagnosis not present

## 2024-02-02 DIAGNOSIS — I10 Essential (primary) hypertension: Secondary | ICD-10-CM | POA: Diagnosis not present

## 2024-02-02 DIAGNOSIS — Z87891 Personal history of nicotine dependence: Secondary | ICD-10-CM

## 2024-02-02 DIAGNOSIS — I2510 Atherosclerotic heart disease of native coronary artery without angina pectoris: Secondary | ICD-10-CM | POA: Diagnosis not present

## 2024-02-02 DIAGNOSIS — N1831 Chronic kidney disease, stage 3a: Secondary | ICD-10-CM

## 2024-02-02 DIAGNOSIS — I130 Hypertensive heart and chronic kidney disease with heart failure and stage 1 through stage 4 chronic kidney disease, or unspecified chronic kidney disease: Secondary | ICD-10-CM

## 2024-02-02 HISTORY — PX: HEMIARTHROPLASTY (BIPOLAR), HIP, DIRECT ANTERIOR APPROACH, FOR FRACTURE: SHX7584

## 2024-02-02 LAB — BASIC METABOLIC PANEL WITH GFR
Anion gap: 12 (ref 5–15)
BUN: 28 mg/dL — ABNORMAL HIGH (ref 8–23)
CO2: 27 mmol/L (ref 22–32)
Calcium: 8.8 mg/dL — ABNORMAL LOW (ref 8.9–10.3)
Chloride: 103 mmol/L (ref 98–111)
Creatinine, Ser: 1.45 mg/dL — ABNORMAL HIGH (ref 0.61–1.24)
GFR, Estimated: 48 mL/min — ABNORMAL LOW (ref >60)
Glucose, Bld: 111 mg/dL — ABNORMAL HIGH (ref 70–99)
Potassium: 3.5 mmol/L (ref 3.5–5.1)
Sodium: 142 mmol/L (ref 135–145)

## 2024-02-02 LAB — CBC
HCT: 42.4 % (ref 39.0–52.0)
Hemoglobin: 14.5 g/dL (ref 13.0–17.0)
MCH: 30 pg (ref 26.0–34.0)
MCHC: 34.2 g/dL (ref 30.0–36.0)
MCV: 87.6 fL (ref 80.0–100.0)
Platelets: 142 K/uL — ABNORMAL LOW (ref 150–400)
RBC: 4.84 MIL/uL (ref 4.22–5.81)
RDW: 12.5 % (ref 11.5–15.5)
WBC: 7.2 K/uL (ref 4.0–10.5)
nRBC: 0 % (ref 0.0–0.2)

## 2024-02-02 LAB — GLUCOSE, CAPILLARY
Glucose-Capillary: 120 mg/dL — ABNORMAL HIGH (ref 70–99)
Glucose-Capillary: 156 mg/dL — ABNORMAL HIGH (ref 70–99)
Glucose-Capillary: 199 mg/dL — ABNORMAL HIGH (ref 70–99)
Glucose-Capillary: 216 mg/dL — ABNORMAL HIGH (ref 70–99)
Glucose-Capillary: 241 mg/dL — ABNORMAL HIGH (ref 70–99)

## 2024-02-02 LAB — MAGNESIUM: Magnesium: 2.5 mg/dL — ABNORMAL HIGH (ref 1.7–2.4)

## 2024-02-02 SURGERY — HEMIARTHROPLASTY (BIPOLAR), HIP, DIRECT ANTERIOR APPROACH, FOR FRACTURE
Anesthesia: Spinal | Site: Hip | Laterality: Right

## 2024-02-02 MED ORDER — LACTATED RINGERS IV SOLN: Freq: Once | INTRAVENOUS | Status: AC

## 2024-02-02 MED ORDER — HYDROMORPHONE HCL 1 MG/ML IJ SOLN
INTRAMUSCULAR | Status: AC
  Filled 2024-02-02: qty 0.5

## 2024-02-02 MED ORDER — PROPOFOL 500 MG/50ML IV EMUL
INTRAVENOUS | Status: AC
  Filled 2024-02-02: qty 50

## 2024-02-02 MED ORDER — IPRATROPIUM-ALBUTEROL 0.5-2.5 (3) MG/3ML IN SOLN: 3.0000 mL | Freq: Three times a day (TID) | RESPIRATORY_TRACT | Status: DC

## 2024-02-02 MED ORDER — BUPIVACAINE-EPINEPHRINE (PF) 0.5% -1:200000 IJ SOLN
INTRAMUSCULAR | Status: AC
  Filled 2024-02-02: qty 30

## 2024-02-02 MED ORDER — VASOPRESSIN 20 UNIT/ML IV SOLN
INTRAVENOUS | Status: DC | PRN
Start: 2024-02-02 — End: 2024-02-02
  Administered 2024-02-02: 2 [IU] via INTRAVENOUS

## 2024-02-02 MED ORDER — VASOPRESSIN 20 UNIT/ML IV SOLN
INTRAVENOUS | Status: AC
  Filled 2024-02-02: qty 1

## 2024-02-02 MED ORDER — CHLORHEXIDINE GLUCONATE 0.12 % MT SOLN
15.0000 mL | Freq: Once | OROMUCOSAL | Status: AC
  Administered 2024-02-02: 15 mL via OROMUCOSAL

## 2024-02-02 MED ORDER — ORAL CARE MOUTH RINSE: 15.0000 mL | Freq: Once | OROMUCOSAL | Status: AC

## 2024-02-02 MED ORDER — VANCOMYCIN HCL 1 G IV SOLR
INTRAVENOUS | Status: DC | PRN
Start: 2024-02-02 — End: 2024-02-02
  Administered 2024-02-02: 1000 mg

## 2024-02-02 MED ORDER — CEFAZOLIN SODIUM-DEXTROSE 2-4 GM/100ML-% IV SOLN
2.0000 g | Freq: Three times a day (TID) | INTRAVENOUS | Status: AC
  Administered 2024-02-02 – 2024-02-03 (×3): 2 g via INTRAVENOUS
  Filled 2024-02-02 (×3): qty 100

## 2024-02-02 MED ORDER — BACITRACIN ZINC 500 UNIT/GM EX OINT
TOPICAL_OINTMENT | CUTANEOUS | Status: AC
  Filled 2024-02-02: qty 1.8

## 2024-02-02 MED ORDER — HYDROMORPHONE HCL 1 MG/ML IJ SOLN
INTRAMUSCULAR | Status: DC | PRN
  Administered 2024-02-02 (×3): .5 mg via INTRAVENOUS

## 2024-02-02 MED ORDER — IPRATROPIUM-ALBUTEROL 0.5-2.5 (3) MG/3ML IN SOLN
3.0000 mL | Freq: Three times a day (TID) | RESPIRATORY_TRACT | Status: DC
  Administered 2024-02-02 – 2024-02-04 (×6): 3 mL via RESPIRATORY_TRACT
  Filled 2024-02-02 (×6): qty 3

## 2024-02-02 MED ORDER — PROPOFOL 500 MG/50ML IV EMUL
INTRAVENOUS | Status: DC | PRN
  Administered 2024-02-02: 30 mg via INTRAVENOUS
  Administered 2024-02-02: 100 ug/kg/min via INTRAVENOUS
  Administered 2024-02-02: 50 mg via INTRAVENOUS

## 2024-02-02 MED ORDER — VASOPRESSIN 20 UNIT/ML IV SOLN
2.0000 [IU] | INTRAVENOUS | Status: AC
  Administered 2024-02-02: 2 [IU] via INTRAVENOUS

## 2024-02-02 MED ORDER — 0.9 % SODIUM CHLORIDE (POUR BTL) OPTIME
TOPICAL | Status: DC | PRN
  Administered 2024-02-02: 1000 mL

## 2024-02-02 MED ORDER — VANCOMYCIN HCL 1000 MG IV SOLR
INTRAVENOUS | Status: AC
  Filled 2024-02-02: qty 20

## 2024-02-02 MED ORDER — PROPOFOL 10 MG/ML IV BOLUS
INTRAVENOUS | Status: AC
  Filled 2024-02-02: qty 20

## 2024-02-02 MED ORDER — VANCOMYCIN HCL 1000 MG IV SOLR
INTRAVENOUS | Status: AC
Start: 2024-02-02 — End: 2024-02-02
  Filled 2024-02-02: qty 20

## 2024-02-02 MED ORDER — BUPIVACAINE-EPINEPHRINE 0.5% -1:200000 IJ SOLN
INTRAMUSCULAR | Status: DC | PRN
  Administered 2024-02-02: 30 mL

## 2024-02-02 MED ORDER — BUPIVACAINE HCL (PF) 0.5 % IJ SOLN
INTRAMUSCULAR | Status: DC | PRN
  Administered 2024-02-02: 2 mL

## 2024-02-02 MED ORDER — SODIUM CHLORIDE (PF) 0.9 % IJ SOLN
INTRAMUSCULAR | Status: AC
Start: 2024-02-02 — End: 2024-02-02
  Filled 2024-02-02: qty 30

## 2024-02-02 MED ORDER — GUAIFENESIN ER 600 MG PO TB12
600.0000 mg | ORAL_TABLET | Freq: Two times a day (BID) | ORAL | Status: DC
  Administered 2024-02-03 – 2024-02-04 (×3): 600 mg via ORAL
  Filled 2024-02-02 (×3): qty 1

## 2024-02-02 MED ORDER — DEXTROMETHORPHAN POLISTIREX ER 30 MG/5ML PO SUER
30.0000 mg | Freq: Two times a day (BID) | ORAL | Status: DC
  Administered 2024-02-02 – 2024-02-04 (×4): 30 mg via ORAL
  Filled 2024-02-02 (×4): qty 5

## 2024-02-02 MED ORDER — LACTATED RINGERS IV SOLN: INTRAVENOUS | Status: DC

## 2024-02-02 MED ORDER — SODIUM CHLORIDE 0.9 % IR SOLN
Status: DC | PRN
  Administered 2024-02-02: 3000 mL

## 2024-02-02 MED ORDER — BACITRACIN ZINC 500 UNIT/GM EX OINT
TOPICAL_OINTMENT | CUTANEOUS | Status: DC | PRN
  Administered 2024-02-02: 2 via TOPICAL

## 2024-02-02 SURGICAL SUPPLY — 48 items
BLADE SAGITTAL 25.0X1.27X90 (BLADE) ×1 IMPLANT
CHLORAPREP W/TINT 26 (MISCELLANEOUS) ×1 IMPLANT
CLOTH BEACON ORANGE TIMEOUT ST (SAFETY) ×1 IMPLANT
COUNTER NDL MAGNETIC 40 RED (SET/KITS/TRAYS/PACK) ×1 IMPLANT
COUNTER NEEDLE MAGNETIC 40 RED (SET/KITS/TRAYS/PACK) ×1 IMPLANT
COVER LIGHT HANDLE (MISCELLANEOUS) ×2 IMPLANT
COVER PERINEAL POST (MISCELLANEOUS) IMPLANT
DRAPE C-ARM FOLDED MOBILE STRL (DRAPES) ×1 IMPLANT
DRAPE STERI IOBAN 125X83 (DRAPES) ×1 IMPLANT
DRAPE U-SHAPE 47X51 STRL (DRAPES) ×2 IMPLANT
DRAPE UTILITY W/TAPE 26X15 (DRAPES) ×2 IMPLANT
DRESSING AQUACEL AG ADV 3.5X12 (MISCELLANEOUS) ×1 IMPLANT
DRSG MEPILEX SACRM 8.7X9.8 (GAUZE/BANDAGES/DRESSINGS) ×1 IMPLANT
ELECT BLADE 6 FLAT ULTRCLN (ELECTRODE) ×1 IMPLANT
ELECTRODE REM PT RTRN 9FT ADLT (ELECTROSURGICAL) ×1 IMPLANT
GLOVE BIO SURGEON STRL SZ7 (GLOVE) IMPLANT
GLOVE BIO SURGEON STRL SZ8.5 (GLOVE) IMPLANT
GLOVE BIOGEL PI IND STRL 7.0 (GLOVE) ×2 IMPLANT
GLOVE BIOGEL PI IND STRL 8.5 (GLOVE) IMPLANT
GOWN STRL REUS W/TWL LRG LVL3 (GOWN DISPOSABLE) ×2 IMPLANT
GOWN STRL REUS W/TWL XL LVL3 (GOWN DISPOSABLE) ×1 IMPLANT
HEAD BIPOLAR DEPUY 50 (Hips) IMPLANT
HEAD FEM STD 28X+1.5 STRL (Hips) IMPLANT
INST SET MAJOR BONE (KITS) ×1 IMPLANT
KIT TURNOVER KIT A (KITS) ×1 IMPLANT
MANIFOLD NEPTUNE II (INSTRUMENTS) ×1 IMPLANT
MARKER SKIN DUAL TIP RULER LAB (MISCELLANEOUS) ×1 IMPLANT
MAT BLUE FLOOR 46X72 FLO (MISCELLANEOUS) IMPLANT
NDL HYPO 21X1.5 SAFETY (NEEDLE) IMPLANT
NEEDLE HYPO 21X1.5 SAFETY (NEEDLE) ×1 IMPLANT
PACK TOTAL JOINT (CUSTOM PROCEDURE TRAY) ×1 IMPLANT
PAD ARMBOARD POSITIONER FOAM (MISCELLANEOUS) ×1 IMPLANT
PENCIL SMOKE EVACUATOR (MISCELLANEOUS) ×1 IMPLANT
SET BASIN LINEN APH (SET/KITS/TRAYS/PACK) ×1 IMPLANT
SET HNDPC FAN SPRY TIP SCT (DISPOSABLE) ×1 IMPLANT
SOL .9 NS 3000ML IRR UROMATIC (IV SOLUTION) ×1 IMPLANT
SOLN 0.9% NACL POUR BTL 1000ML (IV SOLUTION) ×1 IMPLANT
SOLN STERILE WATER BTL 1000 ML (IV SOLUTION) ×2 IMPLANT
STAPLER VISISTAT (STAPLE) IMPLANT
STEM FEMORAL SZ 6MM STD ACTIS (Stem) IMPLANT
SUT MON AB 2-0 CT1 36 (SUTURE) ×1 IMPLANT
SUT STRATA PDS 0 30 CT-2.5 (SUTURE) IMPLANT
SUT VIC AB 1 CT1 27XBRD ANTBC (SUTURE) ×3 IMPLANT
SYR 30ML LL (SYRINGE) IMPLANT
SYR BULB IRRIG 60ML STRL (SYRINGE) ×2 IMPLANT
TRAY FOLEY MTR SLVR 16FR STAT (SET/KITS/TRAYS/PACK) ×1 IMPLANT
TUBE CONNECTING 12X1/4 (SUCTIONS) ×1 IMPLANT
YANKAUER SUCT 12FT TUBE ARGYLE (SUCTIONS) ×1 IMPLANT

## 2024-02-02 NOTE — Anesthesia Postprocedure Evaluation (Signed)
 Anesthesia Post Note  Patient: Wayne [name]  Procedure(s) Performed: HEMIARTHROPLASTY (BIPOLAR), HIP, DIRECT ANTERIOR APPROACH, FOR FRACTURE (Right: Hip)  Patient location during evaluation: PACU Anesthesia Type: Combined General/Spinal Level of consciousness: sedated Pain management: pain level controlled Vital Signs Assessment: post-procedure vital signs reviewed and stable Respiratory status: spontaneous breathing, patient connected to nasal cannula oxygen, nonlabored ventilation and respiratory function stable Cardiovascular status: blood pressure returned to baseline and stable Postop Assessment: no headache Anesthetic complications: no   There were no known notable events for this encounter.   Last Vitals:  Vitals:   02/02/24 1115 02/02/24 1130  BP: (!) 133/56 (!) 121/53  Pulse: 72 67  Resp: 14 15  Temp:    SpO2: 97% 96%    Last Pain:  Vitals:   02/02/24 1130  TempSrc:   PainSc: Asleep                 Lakaya Tolen L Marthella Osorno

## 2024-02-02 NOTE — Transfer of Care (Signed)
 Immediate Anesthesia Transfer of Care Note  Patient: Wayne [name]  Procedure(s) Performed: HEMIARTHROPLASTY (BIPOLAR), HIP, DIRECT ANTERIOR APPROACH, FOR FRACTURE (Right: Hip)  Patient Location: PACU  Anesthesia Type:Spinal  Level of Consciousness: drowsy, patient cooperative, and responds to stimulation  Airway & Oxygen Therapy: Patient Spontanous Breathing and Patient connected to nasal cannula oxygen  Post-op Assessment: Report given to RN and Post -op Vital signs reviewed and stable  Post vital signs: Reviewed and stable  Last Vitals:  Vitals Value Taken Time  BP 108/55 02/02/24 10:42  Temp    Pulse 90 02/02/24 10:44  Resp 16 02/02/24 10:44  SpO2 95 % 02/02/24 10:44  Vitals shown include unfiled device data.  Last Pain:  Vitals:   02/02/24 0657  TempSrc: Oral  PainSc: 0-No pain      Patients Stated Pain Goal: 0 (01/31/24 1930)  Complications: No notable events documented.

## 2024-02-02 NOTE — Progress Notes (Signed)
   ORTHOPAEDIC PROGRESS NOTE  Scheduled for Right Hip hemiarthroplasty  DOS: 02/02/24  SUBJECTIVE: No issues over night.  Patient is comfortable.    OBJECTIVE: PE:  Awake.  No complaints of pain  Right foot is warm and well perfused Hip in flexed position He is not moving his hip   Vitals:   02/02/24 0452 02/02/24 0657  BP: (!) 182/80 (!) 192/74  Pulse: 80 83  Resp: 18 18  Temp: 98.1 F (36.7 C) 98.5 F (36.9 C)  SpO2: 93% 94%     ASSESSMENT: Wayne [name] is a 83 y.o. male doing well.  Ready for OR.  NPO since midnight.   PLAN: Weightbearing: NWB RLE Incisional and dressing care: Reinforce dressings as needed; none currently Orthopedic device(s): None VTE prophylaxis: Recommend Aspirin  81mg  BID; to begin POD#1 Pain control: PO pain medications as needed; judicious use of narcotics Follow - up plan: 2 weeks   Contact information:     Edvardo Honse A. Onesimo, MD MS [facility] [address] [place],  [place]  [address] Phone: [phone] Fax: [phone]

## 2024-02-02 NOTE — Plan of Care (Signed)

## 2024-02-02 NOTE — Progress Notes (Signed)
 PROGRESS NOTE   Wayne [name]  FMW:[id number] DOB: January 08, 1941 DOA: 01/31/2024 PCP: Severa Rock HERO, FNP   Chief Complaint  Patient presents with   Fall   Level of care: Telemetry  Brief Admission History:  83 y.o. male with medical history significant for diastolic CHF, CKD 3, coronary artery disease, diabetes mellitus, subdural hematoma, peripheral artery disease. Patient presented to the ED with reports of a fall.  Patient's daughter [name] is at bedside.  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  No chest pain no difficulty breathing.  Reports intermittent dizziness when standing over the past month.  He has maintained good oral intake.  No vomiting and no diarrhea.   ED Course: Temperature 97.9.  Heart rate 70s to 80s.  Respiratory rate 17-21.  Blood pressure systolic 150-227.  O2 sats greater than 93% on room air. Head cervical CT negative for acute abnormality.  Chest x-ray without acute cardiopulmonary abnormality.  Pelvic x-ray shows mildly displaced subcapital fracture of the right femoral neck.  EDP talk to Dr. Onesimo, he will see in consultation.     Assessment and Plan:  Closed right hip fracture s/p mechanical fall -pelvic x-ray - mildly displaced, subcapital fracture of the right femoral neck.  Reports intermittent orthostatic dizziness.  Head CT cervical CT, chest x-ray negative for acute abnormality.  EKG shows sinus rhythm without significant change.. - Dr. Onesimo took him to the OR on 11/6 - judicious use of opioid pain meds as needed - restarted oral diet - Family requesting [facility] for rehab stay after surgery if recommended by PT/OT  - per ortho, start aspirin  81 mg BID on 11/7 for DVT prevention   Primary HTN  - As needed labetalol  10mg  for systolic greater than 160 - Added amlodipine 5 mg daily    Diastolic CHF-stable and compensated.   No sign of volume overload.  Last echo 04/2022 EF of 50 to 55%, G2DD. - Resumed Lasix  daily    Uncontrolled Diabetes mellitus, type 2 with vascular complications  - HgbA1c - 7.6%  - SSI- S - Hold glipizide  while inpatient  CBG (last 3)  Recent Labs    02/01/24 2356 02/02/24 0007 02/02/24 0654  GLUCAP 165* 156* 120*    CKD stage III A/B- creatinine 1.6   Coronary artery disease-stable.  EKG unchanged.  No chest pain.  Evaluated by Dr. Lavona 07/2022, no details of CAD history.   Peripheral artery disease- s/p right ray amputation.   DVT prophylaxis: SCDs, start aspirin  81 mg BID on 11/7 Code Status: DNR  Family Communication:  Disposition: TBD    Consultants:  Orthopedics  Procedures:   ORIF planned for 11/6 right hemiarthroplasty Antimicrobials:    Subjective: Pt seen immediately postop and groggy from anaesthesia    Objective: Vitals:   02/02/24 1215 02/02/24 1227 02/02/24 1233 02/02/24 1303  BP: 128/79  131/65 (!) 141/76  Pulse: 72 74 76 79  Resp: 16 14 (!) 22 18  Temp:   97.6 F (36.4 C) (!) 97.5 F (36.4 C)  TempSrc:    Axillary  SpO2: 94% 97% 97% 96%  Weight:      Height:        Intake/Output Summary (Last 24 hours) at 02/02/2024 1506 Last data filed at 02/02/2024 1029 Gross per 24 hour  Intake 1800 ml  Output 825 ml  Net 975  ml   Filed Weights   01/31/24 1549 01/31/24 2008 02/02/24 0657  Weight: 82.5 kg 76.4 kg 76.4 kg   Examination:  General exam: Appears calm and comfortable  Respiratory system: Clear to auscultation. Respiratory effort normal. Cardiovascular system: normal S1 & S2 heard. No JVD, murmurs, rubs, gallops or clicks. No pedal edema. Gastrointestinal system: Abdomen is nondistended, soft and nontender. No organomegaly or masses felt. Normal bowel sounds heard. Central nervous system: Alert and oriented. No focal neurological deficits. Extremities: s/p right foot ray amputation, bandages C/D/I.  Palpable pulses in both lower  extremities.  Skin: No rashes, lesions or ulcers. Psychiatry: Judgement and insight appear normal. Mood & affect appropriate.   Data Reviewed: I have personally reviewed following labs and imaging studies  CBC: Recent Labs  Lab 01/31/24 1638 02/01/24 0443 02/02/24 0355  WBC 7.2 9.6 7.2  NEUTROABS 5.4  --   --   HGB 16.1 14.4 14.5  HCT 45.7 41.2 42.4  MCV 86.1 86.0 87.6  PLT 148* 144* 142*    Basic Metabolic Panel: Recent Labs  Lab 01/31/24 1638 02/01/24 0443 02/02/24 0355  NA 139 142 142  K 3.8 3.4* 3.5  CL 101 103 103  CO2 25 26 27   GLUCOSE 224* 150* 111*  BUN 25* 28* 28*  CREATININE 1.60* 1.67* 1.45*  CALCIUM  9.1 8.6* 8.8*  MG 2.4  --  2.5*    CBG: Recent Labs  Lab 02/01/24 1148 02/01/24 1724 02/01/24 2356 02/02/24 0007 02/02/24 0654  GLUCAP 177* 175* 165* 156* 120*    No results found for this or any previous visit (from the past 240 hours).   Radiology Studies: DG HIP UNILAT W OR W/O PELVIS 2-3 VIEWS RIGHT Result Date: 02/02/2024 EXAM: 2 or more VIEW(S) XRAY OF THE RIGHT HIP 02/02/2024 11:01:00 AM COMPARISON: 01/31/2024 CLINICAL HISTORY: Closed displaced fracture of right femoral neck (HCC) FINDINGS: BONES AND JOINTS: Interval right hip hemiarthroplasty in place. Mild superolateral left acetabular degenerative osteophytosis. Moderate superolateral left acetabular degenerative osteophytes. Mild superomedial left femoroacetabular joint space narrowing. Mild bilateral sacroiliac subchondral sclerosis. No acute fracture or focal osseous lesion. SOFT TISSUES: Lateral right hip surgical skin staples in place. Expected lateral right hip subcutaneous air postoperative change. Moderate atherosclerotic calcifications. IMPRESSION: 1. Interval right hip hemiarthroplasty in expected postoperative position with lateral surgical skin staples and expected lateral subcutaneous air. Electronically signed by: Rockey Kilts MD 02/02/2024 11:57 AM EST RP Workstation: [id number]    DG C-Arm 1-60 Min-No Report Result Date: 02/02/2024 Fluoroscopy was utilized by the requesting physician.  No radiographic interpretation.   CT HEAD WO CONTRAST Result Date: 01/31/2024 EXAM: CT HEAD AND CERVICAL SPINE 01/31/2024 06:09:19 PM TECHNIQUE: CT of the head and cervical spine was performed without the administration of intravenous contrast. Multiplanar reformatted images are provided for review. Automated exposure control, iterative reconstruction, and/or weight based adjustment of the mA/kV was utilized to reduce the radiation dose to as low as reasonably achievable. COMPARISON: None available. CLINICAL HISTORY: Head trauma, minor (Age >= 65y) FINDINGS: CT HEAD BRAIN AND VENTRICLES: No acute intracranial hemorrhage. No mass effect or midline shift. No abnormal extra-axial fluid collection. No evidence of acute infarct. No hydrocephalus. ORBITS: No acute abnormality. SINUSES AND MASTOIDS: No acute abnormality. SOFT TISSUES AND SKULL: No acute skull fracture. No acute soft tissue abnormality. CT CERVICAL SPINE BONES AND ALIGNMENT: No acute fracture or traumatic malalignment. DEGENERATIVE CHANGES: Multilevel facet and uncovertebral hypertrophy with varying degrees of neural foraminal stenosis. SOFT TISSUES: No prevertebral soft tissue  swelling. IMPRESSION: 1. No acute intracranial abnormality. 2. No acute fracture or traumatic malalignment of the cervical spine. Electronically signed by: Gilmore Molt MD 01/31/2024 06:25 PM EST RP Workstation: [id number]   CT CERVICAL SPINE WO CONTRAST Result Date: 01/31/2024 EXAM: CT HEAD AND CERVICAL SPINE 01/31/2024 06:09:19 PM TECHNIQUE: CT of the head and cervical spine was performed without the administration of intravenous contrast. Multiplanar reformatted images are provided for review. Automated exposure control, iterative reconstruction, and/or weight based adjustment of the mA/kV was utilized to reduce the radiation dose to as low as reasonably  achievable. COMPARISON: None available. CLINICAL HISTORY: Head trauma, minor (Age >= 65y) FINDINGS: CT HEAD BRAIN AND VENTRICLES: No acute intracranial hemorrhage. No mass effect or midline shift. No abnormal extra-axial fluid collection. No evidence of acute infarct. No hydrocephalus. ORBITS: No acute abnormality. SINUSES AND MASTOIDS: No acute abnormality. SOFT TISSUES AND SKULL: No acute skull fracture. No acute soft tissue abnormality. CT CERVICAL SPINE BONES AND ALIGNMENT: No acute fracture or traumatic malalignment. DEGENERATIVE CHANGES: Multilevel facet and uncovertebral hypertrophy with varying degrees of neural foraminal stenosis. SOFT TISSUES: No prevertebral soft tissue swelling. IMPRESSION: 1. No acute intracranial abnormality. 2. No acute fracture or traumatic malalignment of the cervical spine. Electronically signed by: Gilmore Molt MD 01/31/2024 06:25 PM EST RP Workstation: [id number]   DG Hip Unilat W or Wo Pelvis 2-3 Views Right Result Date: 01/31/2024 CLINICAL DATA:  fall, right hip pain EXAM: DG HIP (WITH OR WITHOUT PELVIS) 2-3V RIGHT COMPARISON:  None Available. FINDINGS: Osteopenia.Mildly displaced subcapital fracture of the right femoral neck. 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 Aortoiliac and peripheral vascular atherosclerosis. IMPRESSION: Mildly displaced, subcapital fracture of the right femoral neck. Electronically Signed   By: Rogelia Myers M.D.   On: 01/31/2024 17:48   DG Chest Portable 1 View Result Date: 01/31/2024 CLINICAL DATA:  fall , right hip pain EXAM: PORTABLE CHEST - 1 VIEW COMPARISON:  May 28, 2022 FINDINGS: Resolution of the biapical airspace disease on the prior study. Elevation of the right hemidiaphragm. No focal airspace consolidation, pleural effusion, or pneumothorax. Mild cardiomegaly. Tortuous aorta with aortic atherosclerosis. No  acute fracture or destructive lesions. Multilevel thoracic osteophytosis. Bilateral AC joint osteoarthritis. Osteopenia. IMPRESSION: No acute cardiopulmonary abnormality. Electronically Signed   By: Rogelia Myers M.D.   On: 01/31/2024 17:45   Scheduled Meds:  amLODipine  5 mg Oral Daily   furosemide   40 mg Oral Daily   insulin  aspart  0-9 Units Subcutaneous Q6H   Continuous Infusions:   ceFAZolin (ANCEF) IV       LOS: 2 days   Time spent: 55 mins  Ola Raap Vicci, MD How to contact the [facility] Attending or Consulting provider 7A - 7P or covering provider during after hours 7P -7A, for this patient?  Check the care team in [facility] and look for a) attending/consulting [facility] provider listed and b) the [facility] team listed Log into [email or web address] to find provider on call.  Locate the [facility] provider you are looking for under [facility] and page to a number that you can be directly reached. If you still have difficulty reaching the provider, please page the [facility] (Director on Call) for the Hospitalists listed on amion for assistance.  02/02/2024, 3:06 PM

## 2024-02-02 NOTE — Anesthesia Preprocedure Evaluation (Addendum)
 Anesthesia Evaluation  Patient identified by MRN, date of birth, ID band Patient awake    Reviewed: Allergy & Precautions, NPO status , Patient's Chart, lab work & pertinent test results  Airway Mallampati: II  TM Distance: >3 FB Neck ROM: Full    Dental  (+) Edentulous Upper, Missing, Dental Advisory Given   Pulmonary former smoker   Pulmonary exam normal        Cardiovascular hypertension, Pt. on home beta blockers pulmonary hypertension+ CAD, + Cardiac Stents, + Peripheral Vascular Disease and +CHF  Normal cardiovascular exam  ECHO: 1. Left ventricular ejection fraction, by estimation, is 50 to 55% . The left ventricle has low normal function. Left ventricular endocardial border not optimally defined to evaluate regional wall motion, lateral regional wall motion abnormality persists. Left ventricular diastolic parameters are consistent with Grade II diastolic dysfunction ( pseudonormalization) . 2. Right ventricular systolic function is normal. The right ventricular size is normal. Tricuspid regurgitation signal is inadequate for assessing PA pressure. 3. Left atrial size was mildly dilated. 4. The mitral valve is grossly normal. Trivial mitral valve regurgitation. No evidence of mitral stenosis. 5. The aortic valve is grossly normal. There is mild calcification of the aortic valve. Aortic valve regurgitation is not visualized. No aortic stenosis is present. 6. The inferior vena cava is normal in size with greater than 50% respiratory variability, suggesting right atrial pressure of 3 mmHg. Previous echo in 2023 EF was 35% and severe pulm htn but echo above ( 2024 ) was markedly improved but could not do an RV pressure for some reason, but no mention of pulm htn in any medical assessment since then.  Presumably that was an acute condition and his normal condition is less severe.   Neuro/Psych Peripheral neuropathy   Neuromuscular disease   negative psych ROS   GI/Hepatic negative GI ROS, Neg liver ROS,,,  Endo/Other  diabetes, Type 2, Oral Hypoglycemic Agents    Renal/GU Renal InsufficiencyRenal diseaseStage 3a CKD     Musculoskeletal negative musculoskeletal ROS (+)    Abdominal   Peds  Hematology negative hematology ROS (+)   Anesthesia Other Findings osteomyelitis right foot  Reproductive/Obstetrics                              Anesthesia Physical Anesthesia Plan  ASA: 3  Anesthesia Plan: General/Spinal   Post-op Pain Management: Dilaudid  IV   Induction: Intravenous  PONV Risk Score and Plan: Propofol  infusion and Treatment may vary due to age or medical condition  Airway Management Planned: Nasal Cannula and Natural Airway  Additional Equipment: None  Intra-op Plan:   Post-operative Plan:   Informed Consent: I have reviewed the patients History and Physical, chart, labs and discussed the procedure including the risks, benefits and alternatives for the proposed anesthesia with the patient or authorized representative who has indicated his/her understanding and acceptance.   Patient has DNR.  Discussed DNR with patient and Suspend DNR.   Dental advisory given  Plan Discussed with: CRNA, Surgeon and Anesthesiologist  Anesthesia Plan Comments:         Anesthesia Quick Evaluation

## 2024-02-02 NOTE — Op Note (Signed)
 Orthopaedic Surgery Operative Note (CSN: [id number])  Wayne [name]  1940-06-03 Date of Surgery: 02/02/2024   Diagnoses:  Displaced right femoral neck fracture  Procedure: Right Hip Hemiarthroplasty for a displaced femoral neck fracture (CPT [phone])   Operative Finding Successful completion of the planned procedure.  Right hip hemiarthroplasty, using an anterior approach on a Hana table.  Limb lengths were approximately equal.  Hip was stable, as confirmed under fluoroscopy.   Post-Op Diagnosis: Same Surgeons:Primary: Onesimo Oneil LABOR, MD Assistants: Montie Seltzer  Location: [facility] OR ROOM 4 Anesthesia: Sedation plus regional anesthesia Antibiotics: Ancef 2 g with local vancomycin  powder 1 g at the surgical site Tourniquet time: N/A Estimated Blood Loss: 200 cc Complications: None Specimens: None  Implants: Implant Name Type Inv. Item Serial No. Manufacturer Lot No. LRB No. Used Action  STEM FEMORAL SZ STD ACTIS - [id number] Stem STEM FEMORAL SZ STD ACTIS  [employer] [id number] Right 1 Implanted  HEAD BIPOLAR DEPUY 50 - [id number] Hips HEAD BIPOLAR DEPUY 50  [employer] [id number] Right 1 Implanted  HEAD FEM STD 28X+1.5 STRL - [id number] Hips HEAD FEM STD 28X+1.5 [employer] [id number] Right 1 Implanted    Indications for Surgery:   Wayne [name] is a 83 y.o. male who sustained a mechanical fall and has a displaced Right femoral neck fracture.  In order to restore form and function, I have recommended surgery.  Benefits and risks of operative and nonoperative management were discussed prior to surgery with the patient and his daughter and informed consent form was completed.  Specific risks including infection, need for additional surgery, fracture, dislocation, persistent pain, damage to surrounding structures including nerves and blood vessels, poor integration of the implants, blood clots and more severe complications associated with anesthesia.   All questions have been answered.  They elected proceed with surgery.  Surgical consent was finalized.   Procedure:   The patient was identified properly. Informed consent was obtained and the surgical site was marked. The patient was taken to the OR where a spinal and sedation was induced.  The patient was positioned supine on a Hana table, with both feet in boots.  We confirmed appropriate position using fluoroscopy, prior to draping.  A Foley catheter was placed.  The right hip was prepped and draped in the usual sterile fashion.  Timeout was performed before the beginning of the case.  Patient received 2 g of Ancef and 1 g of TXA prior to making incision.  The ASI was palpated, and marked.  We used this is our primary landmark.  We made a longitudonal incision, just distal to the ASIS, extending distally over the anterior thigh.  We incised sharply through skin, then through subcutaneous tissue.  The fascia overlying the TFL was identified, and cleared.  Hemostasis was achieved.  We palpated the ASIS 1 more time, to confirm that we were indeed lateral, and in line with the TFL.  We then used a knife to incise sharply through the fascia across the extent of the incision.  We used pickups to develop a plane medially.  We then introduced a retractor to retract the underlying muscle laterally.  We are able to palpate the femoral head, and a Cobra was placed directly over the superior aspect of the femoral neck.  The crossing arteries were identified, cauterized.  We achieved hemostasis.  Fat overlying the capsule was removed using a rongeurs.  We then able to palpate the inferior aspect of the femoral neck.  An additional Cobra retractor was placed on the inferior aspect of the femoral neck.  We identified the most superior portion of the vastus, and planned out our capsulotomy.  We incised obliquely extending from the inferior aspect of the femoral neck laterally, across the femoral neck to towards the  femoral head.  We then completed a horizontal incision at the most superior aspect of the vastus, towards the inferior femoral neck.  This was taken directly down to bone.  We then used a Cobb elevator, and replaced the superior cobra retractor intracapsular.  We then continued with Bovie cautery to release the medial portion of the hip capsule, and placed the inferior cobra intracapsular.  We continued to release the hip capsule superior and inferior so that we are able to palpate the lesser trochanter.  The patient was very tight, so I made the decision to excise the thickened capsule to improve exposure and mobility of the hip.  Using the superior saddle, as well as the lesser trochanter as guides, we planned out our femoral neck cut.  Retractors were then removed.  Fluoroscopy was then brought in, in order to confirm an appropriate femoral neck cut.  We completed the femoral neck cut using a saw inferiorly, and completed the cut using osteotome.  We then introduced a corkscrew within the cut portion of the femoral neck.  This was initially power, then transitioned to hand.  We manipulated the cut femoral neck and head until we are able to achieve full control.  Additional attachments of the capsule were released using Bovie cautery.  Subsequently, we were able to remove the femoral head.  The femoral head was measured on the back table, and determined to be 50 mm mm.    We then turned our attention the femur and plan for broaching of the femoral stem.  The leg was externally rotated approximately 120 degrees.  We placed a Mueller type retractor on the posterior aspect of the femoral neck cut.  Next, we placed the hook for the bed attachment underneath the femur, with the retractor extending laterally.  This was held in position manually at this point.  The superior leaf of the capsule was identified.  We then placed a Hohmann retractor between the capsule and the gluteus medius tendon, in order to protect the  gluteus tendon.  We proceeded to release the remaining capsule down to the greater trochanter.  We then proceeded to release additional soft tissue, including some of the short external rotators of the posterior aspect of the greater trochanter.  We then released some of the soft tissue attached to the superior aspect of the greater trochanter.  A retractor was then placed underneath the greater trochanter, and held laterally.  The bed attachment was secured within the rotating arm, and the femur was lifted using the bed attachment.  Under direct visualization, the leg was then very carefully extended and a adducted to provide additional exposure of the femoral neck cut.  At this point, we were satisfied with our overall exposure.  We did use a hip retractor superiorly, in order to protect the skin, and the muscle belly further.  A box osteotome was introduced, to gain access to the femoral canal.  We then used a rasp as a canal finder.  We then sequentially reamed until we were able to achieve excellent fit of the femoral canal.  Between each subsequent broach, the canal was sounded to ensure that we do not breached the canal posterior or  anterior.  We matched the native version.  We then trialed a neck, and femoral head.  Retractors were removed.  The leg was repositioned, and ultimately reduced.  We confirmed appropriate alignment and fit under fluoroscopy.  We confirmed that there was no posterior cortex breach by externally rotating the leg 90 degrees, and confirming under fluoroscopy.  The femoral head was dislocated again, and retractors were replaced.  The trial implants were removed.  The femoral canal was irrigated copiously.  The above-stated stem was then opened, and inserted via hand, until we started to experience resistance.  The stem was then impacted within the femoral canal.  We achieved excellent fit.  There was good stability.  A ceramic ball was then placed, and impacted on the femoral stem.   Retractors were removed, and the hip was reduced once again.  Overall appearance of the hip was confirmed under fluoroscopy.  We are satisfied with our alignment and fit within the femoral canal.  Limb lengths were approximately equal, based on imaging.  The hip was irrigated using Pulsavac irrigation.    Vancomycin  was placed within the surgical incision.  The fascia was closed with Strata-fix. Skin was closed in a layered fashion with 2-0 Monocryl, and staples.  Incision was covered with Bacitracin, and an Aquacel dressing.  Patient was awoken taken to PACU in stable condition.   Post-operative plan:  The patient will be WBAT on the operative extremity.  No restrictions. Patient will be admitted to the floor for overnight observation. Evaluation by PT/OT DVT prophylaxis per primary team, no orthopedic contraindications.   To start POD#1 Pain control with PRN pain medication preferring oral medicines.   Follow up plan will be scheduled in approximately 10-14 days for incision check and XR.

## 2024-02-02 NOTE — Anesthesia Procedure Notes (Signed)
 Spinal  Patient location during procedure: OR Start time: 02/02/2024 8:00 AM End time: 02/02/2024 8:06 AM Reason for block: surgical anesthesia Staffing Performed: resident/CRNA  Anesthesiologist: Landry Dunnings, MD Resident/CRNA: Pheobe Adine CROME, CRNA Performed by: Pheobe Adine CROME, CRNA Authorized by: Landry Dunnings, MD   Preanesthetic Checklist Completed: patient identified, IV checked, site marked, risks and benefits discussed, surgical consent, monitors and equipment checked, pre-op evaluation and timeout performed Spinal Block Patient position: right lateral decubitus Prep: ChloraPrep Patient monitoring: heart rate, cardiac monitor, continuous pulse ox and blood pressure Approach: midline Location: L4-5 Injection technique: single-shot Needle Needle type: Quincke  Needle gauge: 22 G Needle length: 10 cm Assessment Events: CSF return

## 2024-02-02 NOTE — Plan of Care (Signed)

## 2024-02-03 ENCOUNTER — Inpatient Hospital Stay (HOSPITAL_COMMUNITY)

## 2024-02-03 ENCOUNTER — Encounter (HOSPITAL_COMMUNITY): Payer: Self-pay | Admitting: Orthopedic Surgery

## 2024-02-03 DIAGNOSIS — I10 Essential (primary) hypertension: Secondary | ICD-10-CM | POA: Diagnosis not present

## 2024-02-03 DIAGNOSIS — S72001D Fracture of unspecified part of neck of right femur, subsequent encounter for closed fracture with routine healing: Secondary | ICD-10-CM | POA: Diagnosis not present

## 2024-02-03 LAB — CBC WITH DIFFERENTIAL/PLATELET
Abs Immature Granulocytes: 0.01 K/uL (ref 0.00–0.07)
Basophils Absolute: 0 K/uL (ref 0.0–0.1)
Basophils Relative: 0 %
Eosinophils Absolute: 0 K/uL (ref 0.0–0.5)
Eosinophils Relative: 0 %
HCT: 37.8 % — ABNORMAL LOW (ref 39.0–52.0)
Hemoglobin: 13 g/dL (ref 13.0–17.0)
Immature Granulocytes: 0 %
Lymphocytes Relative: 12 %
Lymphs Abs: 1.1 K/uL (ref 0.7–4.0)
MCH: 30.2 pg (ref 26.0–34.0)
MCHC: 34.4 g/dL (ref 30.0–36.0)
MCV: 87.7 fL (ref 80.0–100.0)
Monocytes Absolute: 1.2 K/uL — ABNORMAL HIGH (ref 0.1–1.0)
Monocytes Relative: 13 %
Neutro Abs: 7 K/uL (ref 1.7–7.7)
Neutrophils Relative %: 75 %
Platelets: 127 K/uL — ABNORMAL LOW (ref 150–400)
RBC: 4.31 MIL/uL (ref 4.22–5.81)
RDW: 12.5 % (ref 11.5–15.5)
WBC: 9.4 K/uL (ref 4.0–10.5)
nRBC: 0 % (ref 0.0–0.2)

## 2024-02-03 LAB — BASIC METABOLIC PANEL WITH GFR
Anion gap: 14 (ref 5–15)
BUN: 29 mg/dL — ABNORMAL HIGH (ref 8–23)
CO2: 24 mmol/L (ref 22–32)
Calcium: 8.3 mg/dL — ABNORMAL LOW (ref 8.9–10.3)
Chloride: 102 mmol/L (ref 98–111)
Creatinine, Ser: 1.51 mg/dL — ABNORMAL HIGH (ref 0.61–1.24)
GFR, Estimated: 45 mL/min — ABNORMAL LOW (ref >60)
Glucose, Bld: 198 mg/dL — ABNORMAL HIGH (ref 70–99)
Potassium: 3.6 mmol/L (ref 3.5–5.1)
Sodium: 139 mmol/L (ref 135–145)

## 2024-02-03 LAB — GLUCOSE, CAPILLARY
Glucose-Capillary: 212 mg/dL — ABNORMAL HIGH (ref 70–99)
Glucose-Capillary: 217 mg/dL — ABNORMAL HIGH (ref 70–99)
Glucose-Capillary: 225 mg/dL — ABNORMAL HIGH (ref 70–99)
Glucose-Capillary: 106 mg/dL — ABNORMAL HIGH (ref 70–99)
Glucose-Capillary: 144 mg/dL — ABNORMAL HIGH (ref 70–99)

## 2024-02-03 LAB — MAGNESIUM: Magnesium: 2.3 mg/dL (ref 1.7–2.4)

## 2024-02-03 MED ORDER — ASPIRIN 81 MG PO TBEC
81.0000 mg | DELAYED_RELEASE_TABLET | Freq: Two times a day (BID) | ORAL | Status: DC
  Administered 2024-02-03 – 2024-02-04 (×3): 81 mg via ORAL
  Filled 2024-02-03 (×3): qty 1

## 2024-02-03 MED ORDER — INSULIN GLARGINE-YFGN 100 UNIT/ML ~~LOC~~ SOLN: 14.0000 [IU] | Freq: Every day | SUBCUTANEOUS | Status: DC

## 2024-02-03 MED ORDER — INSULIN ASPART 100 UNIT/ML IJ SOLN: 0.0000 [IU] | Freq: Every day | INTRAMUSCULAR | Status: DC

## 2024-02-03 MED ORDER — INSULIN GLARGINE-YFGN 100 UNIT/ML ~~LOC~~ SOLN
12.0000 [IU] | Freq: Every day | SUBCUTANEOUS | Status: DC
  Administered 2024-02-03 – 2024-02-04 (×2): 12 [IU] via SUBCUTANEOUS
  Filled 2024-02-03 (×3): qty 0.12

## 2024-02-03 MED ORDER — INSULIN ASPART 100 UNIT/ML IJ SOLN: 0.0000 [IU] | Freq: Three times a day (TID) | INTRAMUSCULAR | Status: DC

## 2024-02-03 MED ORDER — INSULIN ASPART 100 UNIT/ML IJ SOLN
4.0000 [IU] | Freq: Three times a day (TID) | INTRAMUSCULAR | Status: DC
  Administered 2024-02-03 – 2024-02-04 (×5): 4 [IU] via SUBCUTANEOUS
  Filled 2024-02-03 (×5): qty 1

## 2024-02-03 MED ORDER — INSULIN ASPART 100 UNIT/ML IJ SOLN
0.0000 [IU] | Freq: Three times a day (TID) | INTRAMUSCULAR | Status: DC
  Administered 2024-02-03 (×2): 5 [IU] via SUBCUTANEOUS
  Administered 2024-02-04 (×2): 3 [IU] via SUBCUTANEOUS
  Filled 2024-02-03 (×5): qty 1

## 2024-02-03 MED ORDER — PANTOPRAZOLE SODIUM 40 MG PO TBEC
40.0000 mg | DELAYED_RELEASE_TABLET | Freq: Every evening | ORAL | Status: DC
  Administered 2024-02-03: 40 mg via ORAL
  Filled 2024-02-03: qty 1

## 2024-02-03 NOTE — TOC Initial Note (Signed)
 Transition of Care [facility]) - Initial/Assessment Note    Patient Details  Name: Wayne [name] MRN: [id number] Date of Birth: [birth year]  Transition of Care [facility]) CM/SW Contact:    Lucie Lunger, LCSWA Phone Number: 02/03/2024, 2:40 PM  Clinical Narrative:                 CSW updated that PT is recommending SNF for pt at D/C. Pt and family requesting SNF at [facility]. CSW sent SNF referral to [facility] for review. CSW spoke to [place] with [facility] who confirms they can offer. CSW spoke with pts daughter to provide, they accept bed, HUB reflects bed choice. Insurance auth to be started. TOC to follow.   Expected Discharge Plan: Skilled Nursing Facility Barriers to Discharge: Continued Medical Work up   Patient Goals and CMS Choice Patient states their goals for this hospitalization and ongoing recovery are:: get stronger CMS Medicare.gov Compare Post Acute Care list provided to:: Patient Represenative (must comment) Choice offered to / list presented to : Patient, Adult Children [place] ownership interest in [facility].provided to:: Adult Children    Expected Discharge Plan and Services In-house Referral: Clinical Social Work Discharge Planning Services: CM Consult Post Acute Care Choice: Skilled Nursing Facility Living arrangements for the past 2 months: Single Family Home                                      Prior Living Arrangements/Services Living arrangements for the past 2 months: Single Family Home Lives with:: Self, Adult Children Patient language and need for interpreter reviewed:: Yes Do you feel safe going back to the place where you live?: Yes      Need for Family Participation in Patient Care: Yes (Comment) Care giver support system in place?: Yes (comment) Current home services: DME Criminal Activity/Legal Involvement Pertinent to Current Situation/Hospitalization: No - Comment as needed  Activities of Daily Living   ADL Screening (condition at time of  admission) Independently performs ADLs?: No Does the patient have a NEW difficulty with bathing/dressing/toileting/self-feeding that is expected to last >3 days?: Yes (Initiates electronic notice to provider for possible OT consult) Does the patient have a NEW difficulty with getting in/out of bed, walking, or climbing stairs that is expected to last >3 days?: Yes (Initiates electronic notice to provider for possible PT consult) Does the patient have a NEW difficulty with communication that is expected to last >3 days?: No Is the patient deaf or have difficulty hearing?: No Does the patient have difficulty seeing, even when wearing glasses/contacts?: No Does the patient have difficulty concentrating, remembering, or making decisions?: No  Permission Sought/Granted                  Emotional Assessment Appearance:: Appears stated age Attitude/Demeanor/Rapport: Engaged Affect (typically observed): Accepting Orientation: : Oriented to Self, Oriented to Place, Oriented to  Time, Oriented to Situation Alcohol / Substance Use: Not Applicable Psych Involvement: No (comment)  Admission diagnosis:  Closed right hip fracture (HCC) [S72.001A] Fall, initial encounter [W19.XXXA] Closed displaced fracture of right femoral neck (HCC) [S72.001A] Patient Active Problem List   Diagnosis Date Noted   Closed right hip fracture (HCC) 01/31/2024   Diabetes mellitus (HCC) 06/09/2023   Postural hypotension 09/07/2022   Dementia (HCC) 09/07/2022   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) 09/02/2022   CKD stage 3 due  to type 2 diabetes mellitus (HCC) 09/02/2022   Anemia in stage 3a chronic kidney disease (HCC) 09/02/2022   Aortic atherosclerosis 09/02/2022   Protein-calorie malnutrition, severe 08/25/2022   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   Malnutrition of moderate degree 05/26/2022   AKI (acute kidney injury) 05/21/2022   Pyogenic inflammation of bone (HCC)  05/21/2022   Hypertension associated with diabetes (HCC) 02/16/2022   Hyperlipidemia associated with type 2 diabetes mellitus (HCC) 02/16/2022   Vitamin D  deficiency 02/16/2022   Primary insomnia 02/16/2022   Acute on chronic combined systolic and diastolic CHF (congestive heart failure) (HCC) 12/04/2021   Closed compression fracture of body of L1 vertebra (HCC) 12/03/2021   Chronic diastolic CHF (congestive heart failure) (HCC) 12/02/2021   Chronic kidney disease, stage 3a (HCC) 12/02/2021   Ischemia of toe 12/01/2021   Chronic constipation 11/30/2021   Fall at home 11/28/2021   Subdural hematoma (HCC) 11/28/2021   Failure to thrive in adult 11/28/2021   Vitamin B12 deficiency 11/28/2021   B12 deficiency anemia 11/28/2021   PAD (peripheral artery disease) 11/28/2021   Diabetic neuropathy (HCC) 11/28/2021   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 [facility] [place] [address] Phone: [phone] Fax: [phone]     Social Drivers of Health (SDOH) Social History: SDOH Screenings   Food Insecurity: No Food Insecurity (01/31/2024)  Housing: Low Risk  (01/31/2024)  Transportation Needs: No Transportation Needs (01/31/2024)  Utilities: Not At Risk (01/31/2024)  Alcohol Screen: Low Risk  (05/11/2023)  Depression (PHQ2-9): Low Risk  (09/08/2023)  Financial Resource Strain: Low Risk  (05/11/2023)  Physical Activity: Inactive (05/11/2023)  Social Connections: Socially Isolated (01/31/2024)  Stress: No Stress Concern Present (05/11/2023)  Tobacco Use: Medium Risk (02/02/2024)  Health Literacy: Adequate Health Literacy (05/11/2023)   SDOH Interventions:     Readmission Risk Interventions    11/12/2021    2:59 PM  Readmission Risk Prevention Plan  Post  Dischage Appt Complete  Medication Screening Complete  Transportation Screening Complete

## 2024-02-03 NOTE — Progress Notes (Signed)
 PROGRESS NOTE   Wayne [name]  FMW:[id number] DOB: [birth year] DOA: 01/31/2024 PCP: Severa Rock HERO, FNP   Chief Complaint  Patient presents with   Fall   Level of care: Telemetry  Brief Admission History:  83 y.o. male with medical history significant for diastolic CHF, CKD 3, coronary artery disease, diabetes mellitus, subdural hematoma, peripheral artery disease. Patient presented to the ED with reports of a fall.  Patient's daughter [name] is at bedside.  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  No chest pain no difficulty breathing.  Reports intermittent dizziness when standing over the past month.  He has maintained good oral intake.  No vomiting and no diarrhea.   ED Course: Temperature 97.9.  Heart rate 70s to 80s.  Respiratory rate 17-21.  Blood pressure systolic 150-227.  O2 sats greater than 93% on room air. Head cervical CT negative for acute abnormality.  Chest x-ray without acute cardiopulmonary abnormality.  Pelvic x-ray shows mildly displaced subcapital fracture of the right femoral neck.  EDP talk to Dr. Onesimo, he will see in consultation.     Assessment and Plan:  Closed right hip fracture s/p mechanical fall -pelvic x-ray - mildly displaced, subcapital fracture of the right femoral neck.  Reports intermittent orthostatic dizziness.  Head CT cervical CT, chest x-ray negative for acute abnormality.  EKG shows sinus rhythm without significant change.. - Dr. Onesimo took him to the OR on 11/6 - judicious use of opioid pain meds as needed - restarted oral diet - Family requesting [facility] for rehab stay after surgery if recommended by PT/OT  - per ortho, started aspirin  81 mg BID on 11/7 for DVT prevention   Primary HTN  - As needed labetalol  10mg  for systolic greater than 160 - Added amlodipine 5 mg daily    Diastolic CHF-stable and compensated.   No sign of volume overload.  Last echo 04/2022 EF of 50 to 55%, G2DD. - Resumed Lasix  daily    Uncontrolled Diabetes mellitus, type 2 with vascular complications  - HgbA1c - 7.6%  - SSI- S - Hold glipizide  while inpatient  - added glargine 12 units, novolog  3 units TID with meals, plus increased SSI coverage CBG (last 3)  Recent Labs    02/03/24 0551 02/03/24 0751 02/03/24 1158  GLUCAP 212* 217* 225*    CKD stage III A/B- creatinine 1.6   Coronary artery disease-stable.  EKG unchanged.  No chest pain.  Evaluated by Dr. Lavona 07/2022, no details of CAD history.   Peripheral artery disease- s/p right ray amputation.   DVT prophylaxis: started aspirin  81 mg BID on 11/7 Code Status: DNR  Family Communication:  Disposition: anticipate SNF    Consultants:  Orthopedics  Procedures:   ORIF planned for 11/6 right hemiarthroplasty Antimicrobials:    Subjective: Reports having occasional right leg pain but only intermittently.     Objective: Vitals:   02/03/24 0412 02/03/24 0847 02/03/24 1329 02/03/24 1450  BP: (!) 144/57  (!) 133/41   Pulse: 82  76   Resp: 18  18   Temp: 97.8 F (36.6 C)  97.7 F (36.5 C)   TempSrc: Oral     SpO2: 92% 93% 95% 92%  Weight:      Height:        Intake/Output Summary (Last 24 hours) at 02/03/2024 1545 Last data filed at 02/03/2024  1349 Gross per 24 hour  Intake 1017.89 ml  Output 1400 ml  Net -382.11 ml   Filed Weights   01/31/24 1549 01/31/24 2008 02/02/24 0657  Weight: 82.5 kg 76.4 kg 76.4 kg   Examination:  General exam: Appears calm and comfortable  Respiratory system: Clear to auscultation. Respiratory effort normal. Cardiovascular system: normal S1 & S2 heard. No JVD, murmurs, rubs, gallops or clicks. No pedal edema. Gastrointestinal system: Abdomen is nondistended, soft and nontender. No organomegaly or masses felt. Normal bowel sounds heard. Central nervous system: Alert and oriented. No focal neurological  deficits. Extremities: s/p right foot ray amputation, bandages C/D/I.  Palpable pulses in both lower extremities.  Skin: No rashes, lesions or ulcers. Psychiatry: Judgement and insight appear normal. Mood & affect appropriate.   Data Reviewed: I have personally reviewed following labs and imaging studies  CBC: Recent Labs  Lab 01/31/24 1638 02/01/24 0443 02/02/24 0355 02/03/24 0414  WBC 7.2 9.6 7.2 9.4  NEUTROABS 5.4  --   --  7.0  HGB 16.1 14.4 14.5 13.0  HCT 45.7 41.2 42.4 37.8*  MCV 86.1 86.0 87.6 87.7  PLT 148* 144* 142* 127*    Basic Metabolic Panel: Recent Labs  Lab 01/31/24 1638 02/01/24 0443 02/02/24 0355 02/03/24 0414  NA 139 142 142 139  K 3.8 3.4* 3.5 3.6  CL 101 103 103 102  CO2 25 26 27 24   GLUCOSE 224* 150* 111* 198*  BUN 25* 28* 28* 29*  CREATININE 1.60* 1.67* 1.45* 1.51*  CALCIUM  9.1 8.6* 8.8* 8.3*  MG 2.4  --  2.5* 2.3    CBG: Recent Labs  Lab 02/02/24 2007 02/02/24 2320 02/03/24 0551 02/03/24 0751 02/03/24 1158  GLUCAP 199* 216* 212* 217* 225*    No results found for this or any previous visit (from the past 240 hours).   Radiology Studies: DG CHEST PORT 1 VIEW Result Date: 02/03/2024 EXAM: 1 VIEW(S) XRAY OF THE CHEST 02/03/2024 04:22:00 AM COMPARISON: 01/31/2024 CLINICAL HISTORY: Chest congestion; Cough FINDINGS: LUNGS AND PLEURA: No focal pulmonary opacity. No pulmonary edema. No pleural effusion. No pneumothorax. HEART AND MEDIASTINUM: Atherosclerotic aortic calcifications. BONES AND SOFT TISSUES: Old healed left rib fracture. IMPRESSION: 1. No acute cardiopulmonary process. Electronically signed by: Waddell Calk MD 02/03/2024 07:06 AM EST RP Workstation: [id number]   DG HIP UNILAT WITH PELVIS 2-3 VIEWS RIGHT Result Date: 02/02/2024 EXAM: 2 or 3 VIEW(S) XRAY OF THE RIGHT HIP 02/02/2024 10:16:00 AM COMPARISON: Comparison 01/31/2024 CLINICAL HISTORY: History of right hip hemiarthroplasty. FINDINGS: BONES AND JOINTS: Interval right hip  hemiarthroplasty is present in expected postoperative position without signs of dislocation or periprosthetic fracture. SOFT TISSUES: The soft tissues are unremarkable. IMPRESSION: 1. Right hip hemiarthroplasty in expected postoperative position without dislocation or periprosthetic fracture. Electronically signed by: Waddell Calk MD 02/02/2024 03:47 PM EST RP Workstation: [id number]   DG HIP UNILAT W OR W/O PELVIS 2-3 VIEWS RIGHT Result Date: 02/02/2024 EXAM: 2 or more VIEW(S) XRAY OF THE RIGHT HIP 02/02/2024 11:01:00 AM COMPARISON: 01/31/2024 CLINICAL HISTORY: Closed displaced fracture of right femoral neck (HCC) FINDINGS: BONES AND JOINTS: Interval right hip hemiarthroplasty in place. Mild superolateral left acetabular degenerative osteophytosis. Moderate superolateral left acetabular degenerative osteophytes. Mild superomedial left femoroacetabular joint space narrowing. Mild bilateral sacroiliac subchondral sclerosis. No acute fracture or focal osseous lesion. SOFT TISSUES: Lateral right hip surgical skin staples in place. Expected lateral right hip subcutaneous air postoperative change. Moderate atherosclerotic calcifications. IMPRESSION: 1. Interval right hip hemiarthroplasty in expected postoperative position with  lateral surgical skin staples and expected lateral subcutaneous air. Electronically signed by: Rockey Kilts MD 02/02/2024 11:57 AM EST RP Workstation: [id number]   DG C-Arm 1-60 Min-No Report Result Date: 02/02/2024 Fluoroscopy was utilized by the requesting physician.  No radiographic interpretation.   Scheduled Meds:  amLODipine  5 mg Oral Daily   aspirin  EC  81 mg Oral BID   dextromethorphan   30 mg Oral BID   furosemide   40 mg Oral Daily   guaiFENesin   600 mg Oral BID   insulin  aspart  0-15 Units Subcutaneous TID WC   insulin  aspart  0-5 Units Subcutaneous QHS   insulin  aspart  4 Units Subcutaneous TID WC   insulin  glargine-yfgn  12 Units Subcutaneous Daily    ipratropium-albuterol   3 mL Nebulization TID   pantoprazole  40 mg Oral QPM   Continuous Infusions:     LOS: 3 days   Time spent: 55 mins  Raissa Dam Vicci, MD How to contact the [facility] Attending or Consulting provider 7A - 7P or covering provider during after hours 7P -7A, for this patient?  Check the care team in [facility] and look for a) attending/consulting TRH provider listed and b) the TRH team listed Log into [email or web address] to find provider on call.  Locate the TRH provider you are looking for under [facility] and page to a number that you can be directly reached. If you still have difficulty reaching the provider, please page the [facility] (Director on Call) for the Hospitalists listed on amion for assistance.  02/03/2024, 3:45 PM

## 2024-02-03 NOTE — Plan of Care (Signed)

## 2024-02-03 NOTE — Evaluation (Signed)
 Physical Therapy Evaluation Patient Details Name: Wayne [name] MRN: [id number] DOB: 1940-08-18 Today's Date: 02/03/2024  History of Present Illness  Wayne [name] is a 83 y.o. male with medical history significant for diastolic CHF, CKD 3, coronary artery disease, diabetes mellitus, subdural hematoma, peripheral artery disease.  Patient presented to the ED with reports of a fall.  Patient's daughter Wayne [name] is at bedside.  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  No chest pain no difficulty breathing.  Reports intermittent dizziness when standing over the past month.  He has maintained good oral intake.  No vomiting and no diarrhea.   Clinical Impression  Patient agreeable to PT evaluation. Patient's daughter is present during session, very motivating, contributes to subjective history as pt appears confused and/or forgetful at times throughout session. They report at baseline, patient is a household ambulator with RW but assisted with ADLs. On this date, patient requires mod assist with bed mobility, min assist with functional transfers, and CGA/min A with very short ambulation trial using RW. Pt reports dizziness following supine to sit. BP taken 106/44. Remains dizzy with remaining mobility during session. Pt tolerates sitting in chair at end of session, with daughter and nursing staff present to give pt bath. Patient will benefit from continued skilled physical therapy acutely and in recommended venue in order to address current deficits and improve overall function.        If plan is discharge home, recommend the following: A lot of help with walking and/or transfers;A lot of help with bathing/dressing/bathroom;Assist for transportation;Assistance with cooking/housework;Help with stairs or ramp for entrance   Can travel by private vehicle        Equipment  Recommendations None recommended by PT  Recommendations for Other Services       Functional Status Assessment Patient has had a recent decline in their functional status and demonstrates the ability to make significant improvements in function in a reasonable and predictable amount of time.     Precautions / Restrictions Precautions Precautions: Fall Recall of Precautions/Restrictions: Impaired Precaution/Restrictions Comments: Pt appears slightly confused throughout conversation, looks to daughter when answering some questions during subjective Restrictions Weight Bearing Restrictions Per Provider Order: Yes RLE Weight Bearing Per Provider Order: Weight bearing as tolerated Other Position/Activity Restrictions: RLE      Mobility  Bed Mobility Overal bed mobility: Needs Assistance Bed Mobility: Supine to Sit     Supine to sit: Mod assist     General bed mobility comments: pt demo slow labored movement during bed mobility, consistent verbal cueing required as pt demo slow processing at times, assisted with RLE handling and at trunk with pt scooting to EOB    Transfers Overall transfer level: Needs assistance Equipment used: Rolling walker (2 wheels) Transfers: Sit to/from Stand, Bed to chair/wheelchair/BSC Sit to Stand: Min assist, Mod assist   Step pivot transfers: Min assist, Contact guard assist       General transfer comment: min/mod assist due to inc pain in RLE with weight bearing, using RW, slow-labored movement, very small steps, dec weight shift into RLE    Ambulation/Gait Ambulation/Gait assistance: Min assist, Contact guard assist Gait Distance (Feet): 3 Feet Assistive device: Rolling walker (2 wheels) Gait Pattern/deviations: Step-to pattern, Decreased step length - left, Decreased step length - right, Decreased stance time - right, Decreased weight shift to right, Trunk flexed Gait velocity:  Dec     General Gait Details: Pt limited to a few side steps at  bedside with RW and min/CGA, pt demo dec weight shift into RLE, requires verbal cueing for maintianing upright posture and correcting proximity to RW, limited due to pain but also fatigue  Stairs            Wheelchair Mobility     Tilt Bed    Modified Rankin (Stroke Patients Only)       Balance Overall balance assessment: Needs assistance Sitting-balance support: Feet supported, No upper extremity supported Sitting balance-Leahy Scale: Fair Sitting balance - Comments: Seated EOB   Standing balance support: During functional activity, Reliant on assistive device for balance, Bilateral upper extremity supported Standing balance-Leahy Scale: Poor Standing balance comment: fair to poor with RW           Pertinent Vitals/Pain Pain Assessment Pain Assessment: Faces Faces Pain Scale: Hurts even more Pain Location: No pain at rest, inc pain with mobility Pain Descriptors / Indicators: Grimacing, Moaning Pain Intervention(s): Limited activity within patient's tolerance, Repositioned, Monitored during session    Home Living Family/patient expects to be discharged to:: Private residence Living Arrangements: Alone Available Help at Discharge: Family;Personal care attendant;Available PRN/intermittently Type of Home: House Home Access: Level entry;Elevator     Alternate Level Stairs-Number of Steps: has elevator in home Home Layout: Two level;Bed/bath upstairs Home Equipment: Rolling Walker (2 wheels);Cane - single point;Wheelchair - manual;BSC/3in1;Grab bars - toilet;Grab bars - tub/shower;Shower seat Additional Comments: reports aide comes every day and stays ~4 hours    Prior Function Prior Level of Function : History of Falls (last six months);Needs assist       Physical Assist : Mobility (physical) Mobility (physical): Gait   Mobility Comments: household ambulator with RW, history of falling ADLs Comments: Assisted with ADLs     Extremity/Trunk Assessment    Upper Extremity Assessment Upper Extremity Assessment: Generalized weakness (shoulder flexion AROM ~110 deg, MMT 4/5)    Lower Extremity Assessment Lower Extremity Assessment: Generalized weakness;RLE deficits/detail;LLE deficits/detail RLE Deficits / Details: Expected post-op weakness and pain, R foot TMA RLE: Unable to fully assess due to pain RLE Coordination: decreased gross motor LLE Deficits / Details: L ankle DF MMT 4+/5, hip flexion 4-/5    Cervical / Trunk Assessment Cervical / Trunk Assessment: Normal  Communication   Communication Communication: Impaired Factors Affecting Communication: Other (comment) (HOH)    Cognition Arousal: Alert Behavior During Therapy: WFL for tasks assessed/performed     Following commands: Impaired Following commands impaired: Follows one step commands with increased time     Cueing Cueing Techniques: Verbal cues, Tactile cues, Visual cues     General Comments      Exercises     Assessment/Plan    PT Assessment Patient needs continued PT services;All further PT needs can be met in the next venue of care  PT Problem List Decreased strength;Decreased range of motion;Decreased activity tolerance;Decreased balance;Decreased mobility;Pain       PT Treatment Interventions DME instruction;Gait training;Functional mobility training;Therapeutic exercise;Therapeutic activities;Balance training;Patient/family education    PT Goals (Current goals can be found in the Care Plan section)  Acute Rehab PT Goals Patient Stated Goal: Return home following rehab PT Goal Formulation: With patient/family Time For Goal Achievement: 02/17/24 Potential to Achieve Goals: Good    Frequency Min 3X/week     Co-evaluation               AM-PAC PT 6 Clicks Mobility  Outcome Measure  Help needed turning from your back to your side while in a flat bed without using bedrails?: A Little Help needed moving from lying on your back to sitting on the  side of a flat bed without using bedrails?: A Lot Help needed moving to and from a bed to a chair (including a wheelchair)?: A Lot Help needed standing up from a chair using your arms (e.g., wheelchair or bedside chair)?: A Lot Help needed to walk in hospital room?: A Lot Help needed climbing 3-5 steps with a railing? : A Lot 6 Click Score: 13    End of Session Equipment Utilized During Treatment: Gait belt Activity Tolerance: Patient tolerated treatment well;Patient limited by fatigue;Patient limited by pain Patient left: in chair;with call bell/phone within reach;with nursing/sitter in room;with family/visitor present Nurse Communication: Mobility status PT Visit Diagnosis: Unsteadiness on feet (R26.81);Other abnormalities of gait and mobility (R26.89);History of falling (Z91.81);Muscle weakness (generalized) (M62.81);Difficulty in walking, not elsewhere classified (R26.2);Dizziness and giddiness (R42);Pain Pain - Right/Left: Right Pain - part of body: Hip    Time: 1015-1036 PT Time Calculation (min) (ACUTE ONLY): 21 min   Charges:   PT Evaluation $PT Eval Low Complexity: 1 Low   PT General Charges $$ ACUTE PT VISIT: 1 Visit         2:25 PM, 02/03/24 Yasuo Phimmasone Powell-Butler, PT, DPT [place] with [facility]

## 2024-02-03 NOTE — [place] FL2 (Signed)
 [place]  MEDICAID FL2 LEVEL OF CARE FORM     IDENTIFICATION  Patient Name: Wayne [name] Birthdate: Jan 16, 1941 Sex: male Admission Date (Current Location): 01/31/2024  [facility] and [place] Number:  [employer] and Address:  [facility],  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) 09/02/2022   CKD stage 3 due to type 2 diabetes mellitus (HCC) 09/02/2022   Anemia in stage 3a chronic kidney disease (HCC) 09/02/2022   Aortic atherosclerosis 09/02/2022   Protein-calorie malnutrition, severe 08/25/2022   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   Malnutrition of moderate degree 05/26/2022   AKI (acute kidney injury) 05/21/2022   Pyogenic inflammation of bone (HCC) 05/21/2022   Hypertension associated with diabetes (HCC) 02/16/2022   Hyperlipidemia associated with type 2 diabetes mellitus (HCC) 02/16/2022   Vitamin D  deficiency 02/16/2022   Primary insomnia 02/16/2022   Acute on chronic combined systolic and diastolic CHF (congestive heart failure) (HCC) 12/04/2021   Closed compression fracture of body of L1 vertebra (HCC) 12/03/2021   Chronic diastolic CHF (congestive heart failure) (HCC) 12/02/2021   Chronic kidney disease, stage 3a (HCC) 12/02/2021   Ischemia of toe 12/01/2021   Chronic constipation 11/30/2021   Fall at home 11/28/2021    Subdural hematoma (HCC) 11/28/2021   Failure to thrive in adult 11/28/2021   Vitamin B12 deficiency 11/28/2021   B12 deficiency anemia 11/28/2021   PAD (peripheral artery disease) 11/28/2021   Diabetic neuropathy (HCC) 11/28/2021   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) 11/11/2021   CAD S/P percutaneous coronary angioplasty 11/11/2021    Orientation RESPIRATION BLADDER Height & Weight     Self, Situation, Place  Normal Continent Weight: 168 lb 6.9 oz (76.4 kg) Height:  6' (182.9 cm)  BEHAVIORAL SYMPTOMS/MOOD NEUROLOGICAL BOWEL NUTRITION STATUS      Continent Diet (See D/C summary)  AMBULATORY STATUS COMMUNICATION OF NEEDS Skin   Extensive Assist Verbally Normal                       Personal Care Assistance Level of Assistance  Bathing, Feeding, Dressing Bathing Assistance: Limited assistance Feeding assistance: Independent Dressing Assistance: Limited assistance     Functional Limitations Info  Sight, Hearing, Speech Sight Info: Impaired Hearing Info: Impaired Speech Info: Adequate    SPECIAL CARE FACTORS FREQUENCY  PT (By licensed PT), OT (By licensed OT)     PT Frequency: 5 times weekly OT Frequency: 5 times weekly            Contractures Contractures Info: Not present    Additional Factors Info  Code Status, Allergies Code Status Info: DNR-Limited Allergies Info: NKA           Current Medications (02/03/2024):  This is the current hospital active medication list Current Facility-Administered Medications  Medication Dose Route Frequency Provider Last Rate  Last Admin   acetaminophen  (TYLENOL ) tablet 650 mg  650 mg Oral Q6H PRN Onesimo Oneil LABOR, MD       Or   acetaminophen  (TYLENOL ) suppository 650 mg  650 mg Rectal Q6H PRN Onesimo Oneil LABOR, MD       amLODipine (NORVASC) tablet 5 mg  5 mg Oral Daily Onesimo Oneil LABOR, MD   5 mg at 02/03/24 9162   aspirin  EC tablet 81 mg  81 mg Oral BID Johnson, Clanford L, MD   81 mg at 02/03/24  1203   dextromethorphan  (DELSYM ) 30 MG/5ML liquid 30 mg  30 mg Oral BID Johnson, Clanford L, MD   30 mg at 02/03/24 0836   furosemide  (LASIX ) tablet 40 mg  40 mg Oral Daily Onesimo Oneil LABOR, MD   40 mg at 02/03/24 0837   guaiFENesin  (MUCINEX ) 12 hr tablet 600 mg  600 mg Oral BID Johnson, Clanford L, MD   600 mg at 02/03/24 0837   HYDROmorphone  (DILAUDID ) injection 0.25 mg  0.25 mg Intravenous Q3H PRN Onesimo Oneil LABOR, MD   0.25 mg at 02/03/24 0130   insulin  aspart (novoLOG ) injection 0-15 Units  0-15 Units Subcutaneous TID WC Johnson, Clanford L, MD   5 Units at 02/03/24 1203   insulin  aspart (novoLOG ) injection 0-5 Units  0-5 Units Subcutaneous QHS Johnson, Clanford L, MD       insulin  aspart (novoLOG ) injection 4 Units  4 Units Subcutaneous TID WC Johnson, Clanford L, MD   4 Units at 02/03/24 1203   insulin  glargine-yfgn (SEMGLEE ) injection 12 Units  12 Units Subcutaneous Daily Johnson, Clanford L, MD   12 Units at 02/03/24 0945   ipratropium-albuterol  (DUONEB) 0.5-2.5 (3) MG/3ML nebulizer solution 3 mL  3 mL Nebulization TID Vicci, Clanford L, MD   3 mL at 02/03/24 0845   labetalol  (NORMODYNE ) injection 10 mg  10 mg Intravenous Q2H PRN Onesimo Oneil LABOR, MD       ondansetron  (ZOFRAN ) tablet 4 mg  4 mg Oral Q6H PRN Onesimo Oneil LABOR, MD       Or   ondansetron  (ZOFRAN ) injection 4 mg  4 mg Intravenous Q6H PRN Onesimo Oneil LABOR, MD       Oral care mouth rinse  15 mL Mouth Rinse PRN Onesimo Oneil LABOR, MD       pantoprazole (PROTONIX) EC tablet 40 mg  40 mg Oral QPM Johnson, Clanford L, MD       polyethylene glycol (MIRALAX  / GLYCOLAX ) packet 17 g  17 g Oral Daily PRN Onesimo Oneil LABOR, MD         Discharge Medications: Please see discharge summary for a list of discharge medications.  Relevant Imaging Results:  Relevant Lab Results:   Additional Information SSN: [id number] LCSWA

## 2024-02-03 NOTE — Plan of Care (Signed)
  Problem: Acute Rehab PT Goals(only PT should resolve) Goal: Pt Will Go Supine/Side To Sit Outcome: Progressing Flowsheets (Taken 02/03/2024 1427) Pt will go Supine/Side to Sit: with minimal assist Goal: Patient Will Transfer Sit To/From Stand Outcome: Progressing Flowsheets (Taken 02/03/2024 1427) Patient will transfer sit to/from stand: with minimal assist Goal: Pt Will Transfer Bed To Chair/Chair To Bed Outcome: Progressing Flowsheets (Taken 02/03/2024 1427) Pt will Transfer Bed to Chair/Chair to Bed: with contact guard assist Goal: Pt Will Ambulate Outcome: Progressing Flowsheets (Taken 02/03/2024 1427) Pt will Ambulate:  15 feet  with rolling walker  with contact guard assist    2:29 PM, 02/03/24 Rosaria Settler, PT, DPT [place] with [facility]

## 2024-02-03 NOTE — Progress Notes (Signed)
   ORTHOPAEDIC PROGRESS NOTE  S/p Right Hip hemiarthroplasty; anterior approach  DOS: 02/02/24  SUBJECTIVE: No acute issues overnight.  He did remove his dressing.  This has been replaced.  He is comfortable otherwise.  OBJECTIVE: PE:  Awake.  No complaints of pain Surgical dressing is in place.  No bleeding is appreciated.  Right foot is warm and well perfused Hip in flexed position He is not moving his hip   Vitals:   02/03/24 0412 02/03/24 0847  BP: (!) 144/57   Pulse: 82   Resp: 18   Temp: 97.8 F (36.6 C)   SpO2: 92% 93%     ASSESSMENT: Wayne [name] is a 83 y.o. male who is stable following surgery.  Awaiting a PT/OT evaluation.  PLAN: Weightbearing: WBAT RLE Incisional and dressing care: Reinforce dressings as needed Orthopedic device(s): None VTE prophylaxis: Recommend Aspirin  81mg  BID; okay to initiate anticoagulation starting postoperative day #1. Pain control: PO pain medications as needed; judicious use of narcotics Follow - up plan: 2 weeks   Contact information:     Trenesha Alcaide A. Onesimo, MD MS [facility] [address] [place],  [place]  [address] Phone: [phone] Fax: [phone]

## 2024-02-03 NOTE — Progress Notes (Signed)
 Mobility Specialist Progress Note:    02/03/24 1120  Mobility  Activity Pivoted/transferred to/from [facility]  Level of Assistance Maximum assist, patient does 25-49%  Assistive Device Front wheel walker  Distance Ambulated (ft) 3 ft  Range of Motion/Exercises Active;All extremities  RLE Weight Bearing Per Provider Order WBAT  Activity Response Tolerated well  Mobility Referral Yes  Mobility visit 1 Mobility  Mobility Specialist Start Time (ACUTE ONLY) 1120  Mobility Specialist Stop Time (ACUTE ONLY) 1140  Mobility Specialist Time Calculation (min) (ACUTE ONLY) 20 min   Pt received in chair, requesting assistance to [facility]. Required MaxA to stand and transfer with RW. Tolerated well, slightly confused. Returned to chair, NT in room. Call bell in reach, all needs met.  Nevaeh Korte Mobility Specialist Please contact via Special Educational Needs Teacher or  Rehab office at [phone]

## 2024-02-03 NOTE — Plan of Care (Signed)
  Problem: Clinical Measurements: Goal: Ability to maintain clinical measurements within normal limits will improve Outcome: Progressing   Problem: Activity: Goal: Risk for activity intolerance will decrease Outcome: Progressing   Problem: Nutrition: Goal: Adequate nutrition will be maintained Outcome: Progressing   Problem: Pain Managment: Goal: General experience of comfort will improve and/or be controlled Outcome: Progressing   Problem: Skin Integrity: Goal: Risk for impaired skin integrity will decrease Outcome: Progressing   Problem: Metabolic: Goal: Ability to maintain appropriate glucose levels will improve Outcome: Progressing

## 2024-02-03 NOTE — Care Management Important Message (Signed)
 Important Message  Patient Details  Name: Wayne [name] MRN: [id number] Date of Birth: 04/07/1940   Important Message Given:  Yes - Medicare IM     Ellianne Gowen L Cleota Pellerito 02/03/2024, 11:19 AM

## 2024-02-04 DIAGNOSIS — S72001D Fracture of unspecified part of neck of right femur, subsequent encounter for closed fracture with routine healing: Secondary | ICD-10-CM | POA: Diagnosis not present

## 2024-02-04 DIAGNOSIS — I10 Essential (primary) hypertension: Secondary | ICD-10-CM | POA: Diagnosis not present

## 2024-02-04 LAB — CBC WITH DIFFERENTIAL/PLATELET
Abs Immature Granulocytes: 0.03 K/uL (ref 0.00–0.07)
Basophils Absolute: 0 K/uL (ref 0.0–0.1)
Basophils Relative: 0 %
Eosinophils Absolute: 0.1 K/uL (ref 0.0–0.5)
Eosinophils Relative: 1 %
HCT: 37.5 % — ABNORMAL LOW (ref 39.0–52.0)
Hemoglobin: 12.9 g/dL — ABNORMAL LOW (ref 13.0–17.0)
Immature Granulocytes: 0 %
Lymphocytes Relative: 19 %
Lymphs Abs: 1.8 K/uL (ref 0.7–4.0)
MCH: 30.5 pg (ref 26.0–34.0)
MCHC: 34.4 g/dL (ref 30.0–36.0)
MCV: 88.7 fL (ref 80.0–100.0)
Monocytes Absolute: 1.2 K/uL — ABNORMAL HIGH (ref 0.1–1.0)
Monocytes Relative: 12 %
Neutro Abs: 6.5 K/uL (ref 1.7–7.7)
Neutrophils Relative %: 68 %
Platelets: 131 K/uL — ABNORMAL LOW (ref 150–400)
RBC: 4.23 MIL/uL (ref 4.22–5.81)
RDW: 12.7 % (ref 11.5–15.5)
WBC: 9.6 K/uL (ref 4.0–10.5)
nRBC: 0 % (ref 0.0–0.2)

## 2024-02-04 LAB — BASIC METABOLIC PANEL WITH GFR
Anion gap: 16 — ABNORMAL HIGH (ref 5–15)
BUN: 30 mg/dL — ABNORMAL HIGH (ref 8–23)
CO2: 24 mmol/L (ref 22–32)
Calcium: 8.8 mg/dL — ABNORMAL LOW (ref 8.9–10.3)
Chloride: 102 mmol/L (ref 98–111)
Creatinine, Ser: 1.55 mg/dL — ABNORMAL HIGH (ref 0.61–1.24)
GFR, Estimated: 44 mL/min — ABNORMAL LOW (ref >60)
Glucose, Bld: 106 mg/dL — ABNORMAL HIGH (ref 70–99)
Potassium: 3.3 mmol/L — ABNORMAL LOW (ref 3.5–5.1)
Sodium: 142 mmol/L (ref 135–145)

## 2024-02-04 LAB — GLUCOSE, CAPILLARY
Glucose-Capillary: 133 mg/dL — ABNORMAL HIGH (ref 70–99)
Glucose-Capillary: 156 mg/dL — ABNORMAL HIGH (ref 70–99)
Glucose-Capillary: 154 mg/dL — ABNORMAL HIGH (ref 70–99)

## 2024-02-04 MED ORDER — ASPIRIN 81 MG PO TBEC
81.0000 mg | DELAYED_RELEASE_TABLET | Freq: Two times a day (BID) | ORAL | Status: AC
Start: 2024-02-04 — End: ?

## 2024-02-04 MED ORDER — AMLODIPINE BESYLATE 5 MG PO TABS: 5.0000 mg | ORAL_TABLET | Freq: Every day | ORAL | Status: DC

## 2024-02-04 MED ORDER — POLYETHYLENE GLYCOL 3350 17 G PO PACK: 17.0000 g | PACK | Freq: Every day | ORAL | Status: DC

## 2024-02-04 MED ORDER — OXYCODONE HCL 5 MG PO TABS: 5.0000 mg | ORAL_TABLET | Freq: Three times a day (TID) | ORAL | 0 refills | Status: AC | PRN

## 2024-02-04 MED ORDER — GUAIFENESIN ER 600 MG PO TB12: 600.0000 mg | ORAL_TABLET | Freq: Two times a day (BID) | ORAL | Status: AC

## 2024-02-04 MED ORDER — ACETAMINOPHEN 325 MG PO TABS: 650.0000 mg | ORAL_TABLET | Freq: Four times a day (QID) | ORAL | Status: AC | PRN

## 2024-02-04 MED ORDER — INSULIN ASPART 100 UNIT/ML IJ SOLN: 0.0000 [IU] | Freq: Three times a day (TID) | INTRAMUSCULAR | Status: DC

## 2024-02-04 MED ORDER — FUROSEMIDE 40 MG PO TABS: 40.0000 mg | ORAL_TABLET | Freq: Every day | ORAL | Status: DC

## 2024-02-04 MED ORDER — DEXTROMETHORPHAN POLISTIREX ER 30 MG/5ML PO SUER: 30.0000 mg | Freq: Two times a day (BID) | ORAL | Status: DC | PRN

## 2024-02-04 MED ORDER — IPRATROPIUM-ALBUTEROL 0.5-2.5 (3) MG/3ML IN SOLN: 3.0000 mL | Freq: Three times a day (TID) | RESPIRATORY_TRACT | Status: DC

## 2024-02-04 MED ORDER — PANTOPRAZOLE SODIUM 40 MG PO TBEC: 40.0000 mg | DELAYED_RELEASE_TABLET | Freq: Every evening | ORAL | Status: DC

## 2024-02-04 MED ORDER — POTASSIUM CHLORIDE CRYS ER 20 MEQ PO TBCR
30.0000 meq | EXTENDED_RELEASE_TABLET | Freq: Once | ORAL | Status: AC
  Administered 2024-02-04: 30 meq via ORAL
  Filled 2024-02-04: qty 1

## 2024-02-04 MED ORDER — OXYCODONE HCL 5 MG PO TABS
5.0000 mg | ORAL_TABLET | Freq: Four times a day (QID) | ORAL | Status: DC | PRN
  Administered 2024-02-04: 5 mg via ORAL
  Filled 2024-02-04: qty 1

## 2024-02-04 NOTE — Plan of Care (Signed)

## 2024-02-04 NOTE — Progress Notes (Signed)
 Physical Therapy Treatment Patient Details Name: Wayne [name] MRN: [id number] DOB: 12/31/1940 Today's Date: 02/04/2024   History of Present Illness Wayne [name] is a 83 y.o. male with medical history significant for diastolic CHF, CKD 3, coronary artery disease, diabetes mellitus, subdural hematoma, peripheral artery disease.  Patient presented to the ED with reports of a fall.  Patient's daughter Wayne [name] is at bedside.  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  No chest pain no difficulty breathing.  Reports intermittent dizziness when standing over the past month.  He has maintained good oral intake.  No vomiting and no diarrhea.    PT Comments  Patient agreeable for therapy. Patient demonstrates slow labored movement for sitting up at bedside with difficulty moving RLE due to increasing pain, very unsteady on feet with frequent near loss of balance while taking steps at bedside using RW and limited mostly due to fatigue, poor standing balance and fall risk. Patient tolerated sitting up in chair after therapy. Patient will benefit from continued skilled physical therapy in hospital and recommended venue below to increase strength, balance, endurance for safe ADLs and gait.       If plan is discharge home, recommend the following: A lot of help with walking and/or transfers;A lot of help with bathing/dressing/bathroom;Assist for transportation;Assistance with cooking/housework;Help with stairs or ramp for entrance   Can travel by private vehicle     No  Equipment Recommendations  None recommended by PT    Recommendations for Other Services       Precautions / Restrictions Precautions Precautions: Fall Recall of Precautions/Restrictions: Impaired Restrictions Weight Bearing Restrictions Per Provider Order: Yes RLE Weight Bearing Per Provider Order: Weight  bearing as tolerated     Mobility  Bed Mobility Overal bed mobility: Needs Assistance Bed Mobility: Supine to Sit     Supine to sit: Mod assist     General bed mobility comments: slow labored movment    Transfers Overall transfer level: Needs assistance Equipment used: Rolling walker (2 wheels) Transfers: Sit to/from Stand, Bed to chair/wheelchair/BSC Sit to Stand: Min assist   Step pivot transfers: Mod assist       General transfer comment: unsteady labored movement with fair/poor carryover for using RW    Ambulation/Gait Ambulation/Gait assistance: Mod assist Gait Distance (Feet): 6 Feet Assistive device: Rolling walker (2 wheels) Gait Pattern/deviations: Decreased step length - right, Decreased step length - left, Decreased stride length, Antalgic, Shuffle Gait velocity: slow     General Gait Details: limited to a few slow labored shuffling steps at bedside before having to sit due to fatigue, poor standing balance   Stairs             Wheelchair Mobility     Tilt Bed    Modified Rankin (Stroke Patients Only)       Balance Overall balance assessment: Needs assistance Sitting-balance support: Feet supported, No upper extremity supported Sitting balance-Leahy Scale: Fair Sitting balance - Comments: fair/good seated at EOB   Standing balance support: During functional activity, Reliant on assistive device for balance, Bilateral upper extremity supported Standing balance-Leahy Scale: Poor Standing balance comment: fair/poor using RW                            Communication Communication Communication: No apparent difficulties  Cognition Arousal: Alert Behavior  During Therapy: WFL for tasks assessed/performed                             Following commands: Impaired Following commands impaired: Follows one step commands with increased time    Cueing Cueing Techniques: Verbal cues, Tactile cues, Visual cues  Exercises  General Exercises - Lower Extremity Long Arc Quad: Seated, AROM, Strengthening, Both, 10 reps Hip Flexion/Marching: Seated, AROM, Strengthening, Both, 10 reps Toe Raises: Seated, AROM, Strengthening, Both, 10 reps Heel Raises: Seated, AROM, Strengthening, Both, 10 reps    General Comments        Pertinent Vitals/Pain Pain Assessment Pain Assessment: 0-10 Pain Score: 5  Pain Location: right hip Pain Descriptors / Indicators: Grimacing, Discomfort, Guarding, Sharp Pain Intervention(s): Limited activity within patient's tolerance, Monitored during session, Repositioned    Home Living                          Prior Function            PT Goals (current goals can now be found in the care plan section) Acute Rehab PT Goals Patient Stated Goal: Return home following rehab PT Goal Formulation: With patient/family Time For Goal Achievement: 02/17/24 Potential to Achieve Goals: Good Progress towards PT goals: Progressing toward goals    Frequency    Min 3X/week      PT Plan      Co-evaluation              AM-PAC PT 6 Clicks Mobility   Outcome Measure  Help needed turning from your back to your side while in a flat bed without using bedrails?: A Little Help needed moving from lying on your back to sitting on the side of a flat bed without using bedrails?: A Lot Help needed moving to and from a bed to a chair (including a wheelchair)?: A Lot Help needed standing up from a chair using your arms (e.g., wheelchair or bedside chair)?: A Lot Help needed to walk in hospital room?: A Lot Help needed climbing 3-5 steps with a railing? : A Lot 6 Click Score: 13    End of Session Equipment Utilized During Treatment: Gait belt Activity Tolerance: Patient tolerated treatment well;Patient limited by fatigue;Patient limited by pain Patient left: in chair;with call bell/phone within reach Nurse Communication: Mobility status PT Visit Diagnosis: Unsteadiness on feet  (R26.81);Other abnormalities of gait and mobility (R26.89);History of falling (Z91.81);Muscle weakness (generalized) (M62.81);Difficulty in walking, not elsewhere classified (R26.2);Dizziness and giddiness (R42);Pain Pain - Right/Left: Right Pain - part of body: Hip     Time: 1045-1110 PT Time Calculation (min) (ACUTE ONLY): 25 min  Charges:    $Therapeutic Exercise: 8-22 mins $Therapeutic Activity: 8-22 mins PT General Charges $$ ACUTE PT VISIT: 1 Visit                     1:59 PM, 02/04/24 Lynwood Music, MPT Physical Therapist with [facility] [phone] office [phone] mobile phone

## 2024-02-04 NOTE — Discharge Instructions (Addendum)
 ORTHOPEDIC INSTRUCTIONS FROM DR. CAIRNS Weightbearing: WBAT RLE Incisional and dressing care: Reinforce dressings as needed Orthopedic device(s): None VTE prophylaxis: Recommend Aspirin  81mg  BID; okay to initiate anticoagulation starting postoperative day #1. Pain control: PO pain medications as needed; judicious use of narcotics Follow - up plan: 2 weeks   IMPORTANT INFORMATION: PAY CLOSE ATTENTION   PHYSICIAN DISCHARGE INSTRUCTIONS  Follow with Primary care provider  Rakes, Rock HERO, FNP  and other consultants as instructed by your Hospitalist Physician  SEEK MEDICAL CARE OR RETURN TO EMERGENCY ROOM IF SYMPTOMS COME BACK, WORSEN OR NEW PROBLEM DEVELOPS   Please note: You were cared for by a hospitalist during your hospital stay. Every effort will be made to forward records to your primary care provider.  You can request that your primary care provider send for your hospital records if they have not received them.  Once you are discharged, your primary care physician will handle any further medical issues. Please note that NO REFILLS for any discharge medications will be authorized once you are discharged, as it is imperative that you return to your primary care physician (or establish a relationship with a primary care physician if you do not have one) for your post hospital discharge needs so that they can reassess your need for medications and monitor your lab values.  Please get a complete blood count and chemistry panel checked by your Primary MD at your next visit, and again as instructed by your Primary MD.  Get Medicines reviewed and adjusted: Please take all your medications with you for your next visit with your Primary MD  Laboratory/radiological data: Please request your Primary MD to go over all hospital tests and procedure/radiological results at the follow up, please ask your primary care provider to get all Hospital records sent to his/her office.  In some cases, they will  be blood work, cultures and biopsy results pending at the time of your discharge. Please request that your primary care provider follow up on these results.  If you are diabetic, please bring your blood sugar readings with you to your follow up appointment with primary care.    Please call and make your follow up appointments as soon as possible.    Also Note the following: If you experience worsening of your admission symptoms, develop shortness of breath, life threatening emergency, suicidal or homicidal thoughts you must seek medical attention immediately by calling 911 or calling your MD immediately  if symptoms less severe.  You must read complete instructions/literature along with all the possible adverse reactions/side effects for all the Medicines you take and that have been prescribed to you. Take any new Medicines after you have completely understood and accpet all the possible adverse reactions/side effects.   Do not drive when taking Pain medications or sleeping medications (Benzodiazepines)  Do not take more than prescribed Pain, Sleep and Anxiety Medications. It is not advisable to combine anxiety,sleep and pain medications without talking with your primary care practitioner  Special Instructions: If you have smoked or chewed Tobacco  in the last 2 yrs please stop smoking, stop any regular Alcohol  and or any Recreational drug use.  Wear Seat belts while driving.  Do not drive if taking any narcotic, mind altering or controlled substances or recreational drugs or alcohol.

## 2024-02-04 NOTE — TOC Transition Note (Signed)
 Transition of Care [facility]) - Discharge Note   Patient Details  Name: Wayne [name] MRN: [id number] Date of Birth: 08-Feb-1941  Transition of Care [facility] CM/SW Contact:  Noreen KATHEE Pinal, LCSWA Phone Number: 02/04/2024, 2:26 PM   Clinical Narrative:     Patient is DC today to [facility] Daughter was updated . Room and report number sent to nurse. DC summary sent via HUB. CSW signing off.  Final next level of care: Skilled Nursing Facility Barriers to Discharge: Barriers Resolved   Patient Goals and CMS Choice Patient states their goals for this hospitalization and ongoing recovery are:: get stronger CMS Medicare.gov Compare Post Acute Care list provided to:: Patient Represenative (must comment) Choice offered to / list presented to : Patient, Adult Children [place] ownership interest in [facility].provided to:: Adult Children    Discharge Placement              Patient chooses bed at: [facility] Patient to be transferred to facility by: staff Name of family member notified: patient and daughter Patient and family notified of of transfer: 02/04/24  Discharge Plan and Services Additional resources added to the After Visit Summary for   In-house Referral: Clinical Social Work Discharge Planning Services: CM Consult Post Acute Care Choice: Skilled Nursing Facility                               Social Drivers of Health (SDOH) Interventions SDOH Screenings   Food Insecurity: No Food Insecurity (01/31/2024)  Housing: Low Risk  (01/31/2024)  Transportation Needs: No Transportation Needs (01/31/2024)  Utilities: Not At Risk (01/31/2024)  Alcohol Screen: Low Risk  (05/11/2023)  Depression (PHQ2-9): Low Risk  (09/08/2023)  Financial Resource Strain: Low Risk  (05/11/2023)  Physical Activity: Inactive (05/11/2023)  Social Connections: Socially Isolated (01/31/2024)  Stress: No Stress Concern Present (05/11/2023)  Tobacco Use: Medium Risk (02/02/2024)  Health  Literacy: Adequate Health Literacy (05/11/2023)     Readmission Risk Interventions    02/04/2024    2:23 PM 11/12/2021    2:59 PM  Readmission Risk Prevention Plan  Post Dischage Appt  Complete  Medication Screening  Complete  Transportation Screening Complete Complete  Home Care Screening Complete   Medication Review (RN CM) Complete

## 2024-02-04 NOTE — Discharge Summary (Addendum)
 Physician Discharge Summary  Wayne [name] FMW:[id number] DOB: 1940-05-10 DOA: 01/31/2024  PCP: Severa Rock HERO, FNP Orthopedics: Dr. Onesimo  Admit date: 01/31/2024 Discharge date: 02/04/2024  Disposition:  SNF rehab  Recommendations for Outpatient Follow-up:  Follow up with Dr. Onesimo orthopedics in 2 weeks Please obtain BMP/CBC in 1 week Please check CBG 4 times daily and adjust therapy as needed   Orthopedic Physician Instructions from Dr. Onesimo Weightbearing: WBAT RLE Incisional and dressing care: Reinforce dressings as needed Orthopedic device(s): None VTE prophylaxis: Recommend Aspirin  81mg  BID; okay to initiate anticoagulation starting postoperative day #1. Pain control: PO pain medications as needed; judicious use of narcotics Follow - up plan: 2 weeks  Home Health:  SNF  Discharge Condition: STABLE   CODE STATUS: DNR DIET: Dysphagia 3 chopped   Brief Hospitalization Summary: Please see all hospital notes, images, labs for full details of the hospitalization. Admission provider HPI:  83 y.o. male with medical history significant for diastolic CHF, CKD 3, coronary artery disease, diabetes mellitus, subdural hematoma, peripheral artery disease. Patient presented to the ED with reports of a fall.  Patient's daughter Wayne [name] is at bedside.  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  No chest pain no difficulty breathing.  Reports intermittent dizziness when standing over the past month.  He has maintained good oral intake.  No vomiting and no diarrhea.   ED Course: Temperature 97.9.  Heart rate 70s to 80s.  Respiratory rate 17-21.  Blood pressure systolic 150-227.  O2 sats greater than 93% on room air. Head cervical CT negative for acute abnormality.  Chest x-ray without acute cardiopulmonary abnormality.  Pelvic x-ray shows mildly displaced subcapital fracture  of the right femoral neck.  EDP talk to Dr. Onesimo, he will see in consultation.      Hospital Course by listed problems addressed   Closed right hip fracture s/p mechanical fall -pelvic x-ray - mildly displaced, subcapital fracture of the right femoral neck.  Reports intermittent orthostatic dizziness.  Head CT cervical CT, chest x-ray negative for acute abnormality.  EKG shows sinus rhythm without significant change.. - Dr. Onesimo took him to the OR on 11/6 - judicious use of opioid pain meds as needed - restarted oral diet - tolerating well  - Family requesting [facility] for rehab stay after surgery if recommended by PT/OT  - per ortho, started aspirin  81 mg BID on 11/7 for DVT prevention   Primary HTN  - As needed labetalol  10mg  for systolic greater than 160 - Added amlodipine 5 mg daily    Diastolic CHF-stable and compensated.  No sign of volume overload.  Last echo 04/2022 EF of 50 to 55%, G2DD. - Resumed Lasix  daily    Uncontrolled Diabetes mellitus, type 2 with vascular complications  - HgbA1c - 7.6%  - SSI- S - Hold glipizide  while inpatient  - added glargine 12 units, novolog  3 units TID with meals, plus increased SSI coverage - resume home meds at DC with SSI coverage as needed  CBG (last 3)  Recent Labs    02/04/24 0327 02/04/24 0713 02/04/24 1116  GLUCAP 133* 156* 154*    CKD stage III A/B- creatinine 1.6   Coronary artery disease-stable.  EKG unchanged.  No chest pain.  Evaluated by Dr. Lavona 07/2022, no details of CAD history.   Peripheral artery disease- s/p right ray amputation.  DVT prophylaxis: started aspirin  81 mg BID on 11/7 Code Status: DNR  Family Communication:  Disposition: anticipate SNF  Discharge Diagnoses:  Principal Problem:   Closed right hip fracture [facility])   Discharge Instructions:  Allergies as of 02/04/2024   No Known Allergies      Medication List     TAKE these medications    Accu-Chek Softclix Lancets lancets Use 4  times daily as directed to check blood sugars.   acetaminophen  325 MG tablet Commonly known as: TYLENOL  Take 2 tablets (650 mg total) by mouth every 6 (six) hours as needed for mild pain (pain score 1-3), fever or headache (or Fever >/= 101).   amLODipine 5 MG tablet Commonly known as: NORVASC Take 1 tablet (5 mg total) by mouth daily. Start taking on: February 05, 2024   aspirin  EC 81 MG tablet Take 1 tablet (81 mg total) by mouth 2 (two) times daily. Swallow whole.   dextromethorphan  30 MG/5ML liquid Commonly known as: DELSYM  Take 5 mLs (30 mg total) by mouth 2 (two) times daily as needed for cough.   furosemide  40 MG tablet Commonly known as: LASIX  Take 1 tablet (40 mg total) by mouth daily. **NEEDS TO BE SEEN BEFORE NEXT REFILL**   glipiZIDE  5 MG tablet Commonly known as: GLUCOTROL  TAKE 1 TABLET (5 MG TOTAL) BY MOUTH DAILY BEFORE BREAKFAST. **NEEDS TO BE SEEN BEFORE NEXT REFILL**   guaiFENesin  600 MG 12 hr tablet Commonly known as: MUCINEX  Take 1 tablet (600 mg total) by mouth 2 (two) times daily for 3 days.   insulin  aspart 100 UNIT/ML injection Commonly known as: novoLOG  Inject 0-15 Units into the skin 3 (three) times daily with meals.   ipratropium-albuterol  0.5-2.5 (3) MG/3ML Soln Commonly known as: DUONEB Take 3 mLs by nebulization 3 (three) times daily.   oxyCODONE  5 MG immediate release tablet Commonly known as: Oxy IR/ROXICODONE  Take 1 tablet (5 mg total) by mouth 3 (three) times daily as needed for up to 3 days for severe pain (pain score 7-10).   pantoprazole 40 MG tablet Commonly known as: PROTONIX Take 1 tablet (40 mg total) by mouth every evening.   polyethylene glycol 17 g packet Commonly known as: MIRALAX  / GLYCOLAX  Take 17 g by mouth daily.        Contact information for follow-up providers     Rakes, Rock HERO, FNP .   Specialty: Family Medicine Contact information: [address] [place] [address] [phone]         Onesimo Oneil LABOR, MD. Schedule an appointment as soon as possible for a visit in 2 week(s).   Specialties: Orthopedic Surgery, Sports Medicine Why: Hospital Follow Up, For wound re-check Contact information: [address] [place] [address] [phone]              Contact information for after-discharge care     Destination     [facility] .   Service: Skilled Nursing Contact information: [address] [place]  [address] [phone]                    No Known Allergies Allergies as of 02/04/2024   No Known Allergies      Medication List     TAKE these medications    Accu-Chek Softclix Lancets lancets Use 4 times daily as directed to check blood sugars.   acetaminophen  325 MG tablet Commonly known as: TYLENOL  Take 2 tablets (650 mg total) by  mouth every 6 (six) hours as needed for mild pain (pain score 1-3), fever or headache (or Fever >/= 101).   amLODipine 5 MG tablet Commonly known as: NORVASC Take 1 tablet (5 mg total) by mouth daily. Start taking on: February 05, 2024   aspirin  EC 81 MG tablet Take 1 tablet (81 mg total) by mouth 2 (two) times daily. Swallow whole.   dextromethorphan  30 MG/5ML liquid Commonly known as: DELSYM  Take 5 mLs (30 mg total) by mouth 2 (two) times daily as needed for cough.   furosemide  40 MG tablet Commonly known as: LASIX  Take 1 tablet (40 mg total) by mouth daily. **NEEDS TO BE SEEN BEFORE NEXT REFILL**   glipiZIDE  5 MG tablet Commonly known as: GLUCOTROL  TAKE 1 TABLET (5 MG TOTAL) BY MOUTH DAILY BEFORE BREAKFAST. **NEEDS TO BE SEEN BEFORE NEXT REFILL**   guaiFENesin  600 MG 12 hr tablet Commonly known as: MUCINEX  Take 1 tablet (600 mg total) by mouth 2 (two) times daily for 3 days.   insulin  aspart 100 UNIT/ML injection Commonly known as: novoLOG  Inject 0-15 Units into the skin 3 (three) times daily with meals.   ipratropium-albuterol  0.5-2.5 (3) MG/3ML Soln Commonly known as:  DUONEB Take 3 mLs by nebulization 3 (three) times daily.   oxyCODONE  5 MG immediate release tablet Commonly known as: Oxy IR/ROXICODONE  Take 1 tablet (5 mg total) by mouth 3 (three) times daily as needed for up to 3 days for severe pain (pain score 7-10).   pantoprazole 40 MG tablet Commonly known as: PROTONIX Take 1 tablet (40 mg total) by mouth every evening.   polyethylene glycol 17 g packet Commonly known as: MIRALAX  / GLYCOLAX  Take 17 g by mouth daily.        Procedures/Studies: DG CHEST PORT 1 VIEW Result Date: 02/03/2024 EXAM: 1 VIEW(S) XRAY OF THE CHEST 02/03/2024 04:22:00 AM COMPARISON: 01/31/2024 CLINICAL HISTORY: Chest congestion; Cough FINDINGS: LUNGS AND PLEURA: No focal pulmonary opacity. No pulmonary edema. No pleural effusion. No pneumothorax. HEART AND MEDIASTINUM: Atherosclerotic aortic calcifications. BONES AND SOFT TISSUES: Old healed left rib fracture. IMPRESSION: 1. No acute cardiopulmonary process. Electronically signed by: Waddell Calk MD 02/03/2024 07:06 AM EST RP Workstation: [id number]   DG HIP UNILAT WITH PELVIS 2-3 VIEWS RIGHT Result Date: 02/02/2024 EXAM: 2 or 3 VIEW(S) XRAY OF THE RIGHT HIP 02/02/2024 10:16:00 AM COMPARISON: Comparison 01/31/2024 CLINICAL HISTORY: History of right hip hemiarthroplasty. FINDINGS: BONES AND JOINTS: Interval right hip hemiarthroplasty is present in expected postoperative position without signs of dislocation or periprosthetic fracture. SOFT TISSUES: The soft tissues are unremarkable. IMPRESSION: 1. Right hip hemiarthroplasty in expected postoperative position without dislocation or periprosthetic fracture. Electronically signed by: Waddell Calk MD 02/02/2024 03:47 PM EST RP Workstation: [id number]   DG HIP UNILAT W OR W/O PELVIS 2-3 VIEWS RIGHT Result Date: 02/02/2024 EXAM: 2 or more VIEW(S) XRAY OF THE RIGHT HIP 02/02/2024 11:01:00 AM COMPARISON: 01/31/2024 CLINICAL HISTORY: Closed displaced fracture of right femoral neck  (HCC) FINDINGS: BONES AND JOINTS: Interval right hip hemiarthroplasty in place. Mild superolateral left acetabular degenerative osteophytosis. Moderate superolateral left acetabular degenerative osteophytes. Mild superomedial left femoroacetabular joint space narrowing. Mild bilateral sacroiliac subchondral sclerosis. No acute fracture or focal osseous lesion. SOFT TISSUES: Lateral right hip surgical skin staples in place. Expected lateral right hip subcutaneous air postoperative change. Moderate atherosclerotic calcifications. IMPRESSION: 1. Interval right hip hemiarthroplasty in expected postoperative position with lateral surgical skin staples and expected lateral subcutaneous air. Electronically signed by: Rockey Kilts MD 02/02/2024 11:57 AM  EST RP Workstation: [id number]   DG C-Arm 1-60 Min-No Report Result Date: 02/02/2024 Fluoroscopy was utilized by the requesting physician.  No radiographic interpretation.   CT HEAD WO CONTRAST Result Date: 01/31/2024 EXAM: CT HEAD AND CERVICAL SPINE 01/31/2024 06:09:19 PM TECHNIQUE: CT of the head and cervical spine was performed without the administration of intravenous contrast. Multiplanar reformatted images are provided for review. Automated exposure control, iterative reconstruction, and/or weight based adjustment of the mA/kV was utilized to reduce the radiation dose to as low as reasonably achievable. COMPARISON: None available. CLINICAL HISTORY: Head trauma, minor (Age >= 65y) FINDINGS: CT HEAD BRAIN AND VENTRICLES: No acute intracranial hemorrhage. No mass effect or midline shift. No abnormal extra-axial fluid collection. No evidence of acute infarct. No hydrocephalus. ORBITS: No acute abnormality. SINUSES AND MASTOIDS: No acute abnormality. SOFT TISSUES AND SKULL: No acute skull fracture. No acute soft tissue abnormality. CT CERVICAL SPINE BONES AND ALIGNMENT: No acute fracture or traumatic malalignment. DEGENERATIVE CHANGES: Multilevel facet and  uncovertebral hypertrophy with varying degrees of neural foraminal stenosis. SOFT TISSUES: No prevertebral soft tissue swelling. IMPRESSION: 1. No acute intracranial abnormality. 2. No acute fracture or traumatic malalignment of the cervical spine. Electronically signed by: Gilmore Molt MD 01/31/2024 06:25 PM EST RP Workstation: [id number]   CT CERVICAL SPINE WO CONTRAST Result Date: 01/31/2024 EXAM: CT HEAD AND CERVICAL SPINE 01/31/2024 06:09:19 PM TECHNIQUE: CT of the head and cervical spine was performed without the administration of intravenous contrast. Multiplanar reformatted images are provided for review. Automated exposure control, iterative reconstruction, and/or weight based adjustment of the mA/kV was utilized to reduce the radiation dose to as low as reasonably achievable. COMPARISON: None available. CLINICAL HISTORY: Head trauma, minor (Age >= 65y) FINDINGS: CT HEAD BRAIN AND VENTRICLES: No acute intracranial hemorrhage. No mass effect or midline shift. No abnormal extra-axial fluid collection. No evidence of acute infarct. No hydrocephalus. ORBITS: No acute abnormality. SINUSES AND MASTOIDS: No acute abnormality. SOFT TISSUES AND SKULL: No acute skull fracture. No acute soft tissue abnormality. CT CERVICAL SPINE BONES AND ALIGNMENT: No acute fracture or traumatic malalignment. DEGENERATIVE CHANGES: Multilevel facet and uncovertebral hypertrophy with varying degrees of neural foraminal stenosis. SOFT TISSUES: No prevertebral soft tissue swelling. IMPRESSION: 1. No acute intracranial abnormality. 2. No acute fracture or traumatic malalignment of the cervical spine. Electronically signed by: Gilmore Molt MD 01/31/2024 06:25 PM EST RP Workstation: [id number]   DG Hip Unilat W or Wo Pelvis 2-3 Views Right Result Date: 01/31/2024 CLINICAL DATA:  fall, right hip pain EXAM: DG HIP (WITH OR WITHOUT PELVIS) 2-3V RIGHT COMPARISON:  None Available. FINDINGS: Osteopenia.Mildly displaced subcapital  fracture of the right femoral neck. 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 Aortoiliac and peripheral vascular atherosclerosis. IMPRESSION: Mildly displaced, subcapital fracture of the right femoral neck. Electronically Signed   By: Rogelia Myers M.D.   On: 01/31/2024 17:48   DG Chest Portable 1 View Result Date: 01/31/2024 CLINICAL DATA:  fall , right hip pain EXAM: PORTABLE CHEST - 1 VIEW COMPARISON:  May 28, 2022 FINDINGS: Resolution of the biapical airspace disease on the prior study. Elevation of the right hemidiaphragm. No focal airspace consolidation, pleural effusion, or pneumothorax. Mild cardiomegaly. Tortuous aorta with aortic atherosclerosis. No acute fracture or destructive lesions. Multilevel thoracic osteophytosis. Bilateral AC joint osteoarthritis. Osteopenia. IMPRESSION: No acute cardiopulmonary abnormality. Electronically Signed   By: Rogelia Myers M.D.   On: 01/31/2024 17:45  Subjective: No complaints.   Discharge Exam: Vitals:   02/04/24 0328 02/04/24 1013  BP: (!) 150/66   Pulse: 78   Resp: 16   Temp: 97.9 F (36.6 C)   SpO2: 92% 94%   Vitals:   02/03/24 1947 02/03/24 2023 02/04/24 0328 02/04/24 1013  BP:  (!) 131/57 (!) 150/66   Pulse:  82 78   Resp:  16 16   Temp:  97.9 F (36.6 C) 97.9 F (36.6 C)   TempSrc:  Oral Oral   SpO2: 94% 92% 92% 94%  Weight:      Height:       General exam: Appears calm and comfortable  Respiratory system: Clear to auscultation. Respiratory effort normal. Cardiovascular system: normal S1 & S2 heard. No JVD, murmurs, rubs, gallops or clicks. No pedal edema. Gastrointestinal system: Abdomen is nondistended, soft and nontender. No organomegaly or masses felt. Normal bowel sounds heard. Central nervous system: Alert and oriented. No focal neurological deficits. Extremities: s/p right foot ray amputation,  bandages C/D/I.  Palpable pulses in both lower extremities.  Skin: No rashes, lesions or ulcers. Psychiatry: Judgement and insight appear normal. Mood & affect appropriate.   The results of significant diagnostics from this hospitalization (including imaging, microbiology, ancillary and laboratory) are listed below for reference.     Microbiology: No results found for this or any previous visit (from the past 240 hours).   Labs: BNP (last 3 results) No results for input(s): BNP in the last 8760 hours. Basic Metabolic Panel: Recent Labs  Lab 01/31/24 1638 02/01/24 0443 02/02/24 0355 02/03/24 0414 02/04/24 0456  NA 139 142 142 139 142  K 3.8 3.4* 3.5 3.6 3.3*  CL 101 103 103 102 102  CO2 25 26 27 24 24   GLUCOSE 224* 150* 111* 198* 106*  BUN 25* 28* 28* 29* 30*  CREATININE 1.60* 1.67* 1.45* 1.51* 1.55*  CALCIUM  9.1 8.6* 8.8* 8.3* 8.8*  MG 2.4  --  2.5* 2.3  --    Liver Function Tests: No results for input(s): AST, ALT, ALKPHOS, BILITOT, PROT, ALBUMIN in the last 168 hours. No results for input(s): LIPASE, AMYLASE in the last 168 hours. No results for input(s): AMMONIA in the last 168 hours. CBC: Recent Labs  Lab 01/31/24 1638 02/01/24 0443 02/02/24 0355 02/03/24 0414 02/04/24 0456  WBC 7.2 9.6 7.2 9.4 9.6  NEUTROABS 5.4  --   --  7.0 6.5  HGB 16.1 14.4 14.5 13.0 12.9*  HCT 45.7 41.2 42.4 37.8* 37.5*  MCV 86.1 86.0 87.6 87.7 88.7  PLT 148* 144* 142* 127* 131*   Cardiac Enzymes: No results for input(s): CKTOTAL, CKMB, CKMBINDEX, TROPONINI in the last 168 hours. BNP: Invalid input(s): POCBNP CBG: Recent Labs  Lab 02/03/24 1627 02/03/24 2026 02/04/24 0327 02/04/24 0713 02/04/24 1116  GLUCAP 106* 144* 133* 156* 154*   D-Dimer No results for input(s): DDIMER in the last 72 hours. Hgb A1c No results for input(s): HGBA1C in the last 72 hours. Lipid Profile No results for input(s): CHOL, HDL, LDLCALC, TRIG, CHOLHDL,  LDLDIRECT in the last 72 hours. Thyroid  function studies No results for input(s): TSH, T4TOTAL, T3FREE, THYROIDAB in the last 72 hours.  Invalid input(s): FREET3 Anemia work up No results for input(s): VITAMINB12, FOLATE, FERRITIN, TIBC, IRON, RETICCTPCT in the last 72 hours. Urinalysis    Component Value Date/Time   COLORURINE YELLOW 11/28/2021 0949   APPEARANCEUR Clear 03/11/2023 1503   LABSPEC 1.022 11/28/2021 0949   PHURINE 5.0 11/28/2021 0949  GLUCOSEU Negative 03/11/2023 1503   HGBUR SMALL (A) 11/28/2021 0949   BILIRUBINUR Negative 03/11/2023 1503   KETONESUR 20 (A) 11/28/2021 0949   PROTEINUR 1+ (A) 03/11/2023 1503   PROTEINUR 100 (A) 11/28/2021 0949   UROBILINOGEN 0.2 06/13/2012 0325   NITRITE Negative 03/11/2023 1503   NITRITE NEGATIVE 11/28/2021 0949   LEUKOCYTESUR Negative 03/11/2023 1503   LEUKOCYTESUR NEGATIVE 11/28/2021 0949   Sepsis Labs Recent Labs  Lab 02/01/24 0443 02/02/24 0355 02/03/24 0414 02/04/24 0456  WBC 9.6 7.2 9.4 9.6   Microbiology No results found for this or any previous visit (from the past 240 hours).  Time coordinating discharge: 40 mins  SIGNED:  Afton Louder, MD  [facility] 02/04/2024, 1:52 PM How to contact the [facility] Attending or Consulting provider 7A - 7P or covering provider during after hours 7P -7A, for this patient?  Check the care team in [facility] and look for a) attending/consulting [facility] provider listed and b) the [facility] team listed Log into [email or web address] and use [facility] universal password to access. If you do not have the password, please contact the hospital operator. Locate the [facility] provider you are looking for under [facility] and page to a number that you can be directly reached. If you still have difficulty reaching the provider, please page the [facility] (Director on Call) for the Hospitalists listed on amion for assistance.

## 2024-02-04 NOTE — Progress Notes (Signed)
 Report called to [facility] LPN at [facility]. [phone] room 136. AVS sent with patient. Transported via the tunnel per staff.

## 2024-02-06 ENCOUNTER — Non-Acute Institutional Stay (SKILLED_NURSING_FACILITY): Admitting: Adult Health

## 2024-02-06 ENCOUNTER — Other Ambulatory Visit (HOSPITAL_COMMUNITY): Payer: Self-pay

## 2024-02-06 ENCOUNTER — Encounter: Payer: Self-pay | Admitting: Adult Health

## 2024-02-06 DIAGNOSIS — E1122 Type 2 diabetes mellitus with diabetic chronic kidney disease: Secondary | ICD-10-CM | POA: Diagnosis not present

## 2024-02-06 DIAGNOSIS — K5909 Other constipation: Secondary | ICD-10-CM

## 2024-02-06 DIAGNOSIS — E1151 Type 2 diabetes mellitus with diabetic peripheral angiopathy without gangrene: Secondary | ICD-10-CM | POA: Diagnosis not present

## 2024-02-06 DIAGNOSIS — D631 Anemia in chronic kidney disease: Secondary | ICD-10-CM

## 2024-02-06 DIAGNOSIS — I129 Hypertensive chronic kidney disease with stage 1 through stage 4 chronic kidney disease, or unspecified chronic kidney disease: Secondary | ICD-10-CM

## 2024-02-06 DIAGNOSIS — S72001D Fracture of unspecified part of neck of right femur, subsequent encounter for closed fracture with routine healing: Secondary | ICD-10-CM | POA: Diagnosis not present

## 2024-02-06 DIAGNOSIS — N183 Chronic kidney disease, stage 3 unspecified: Secondary | ICD-10-CM

## 2024-02-06 DIAGNOSIS — Z794 Long term (current) use of insulin: Secondary | ICD-10-CM

## 2024-02-06 DIAGNOSIS — N1831 Chronic kidney disease, stage 3a: Secondary | ICD-10-CM

## 2024-02-06 DIAGNOSIS — I5032 Chronic diastolic (congestive) heart failure: Secondary | ICD-10-CM

## 2024-02-06 NOTE — Progress Notes (Signed)
 Location:  [facility] Room Number: [id number] Place of Service:  SNF (31)   CODE STATUS: dnr   No Known Allergies  Chief Complaint  Patient presents with   Hospitalization Follow-up    HPI:  He is an 83 year old man who has been hospitalized from 01-31-24 through 02-04-24. His past medical history includes: type 2 diabetes mellitus; chf; cad; pad. He had experienced a mechanical fall at home and suffered a right femoral neck fracture. He has a history of a ray amputation of the right foot. He had a hemiarthroplasty on 02-02-24. There were no surgical complications. He is here for short term rehab with his goal to return home before his birthday on the 20nd. 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 Tisa years ago. No details.   Diabetes mellitus with circulatory complication (HCC)    Diabetic foot infection (HCC)    Encephalopathy    Former tobacco use    Hypertension    Lactic acidosis 11/28/2021   Multifocal pneumonia 11/28/2021   PAD (peripheral artery disease)    Peripheral neuropathy    SDH (subdural hematoma) (HCC)    Severe pulmonary hypertension (HCC)     Past Surgical History:  Procedure Laterality Date   ABDOMINAL AORTOGRAM W/LOWER EXTREMITY Right 11/13/2021   Procedure: ABDOMINAL AORTOGRAM W/LOWER EXTREMITY;  Surgeon: Magda Debby SAILOR, MD;  Location: [facility];  Service: Cardiovascular;  Laterality: Right;   AMPUTATION Right 05/23/2022   Procedure: AMPUTATION RAY;  Surgeon: Malvin Marsa FALCON, DPM;  Location: [facility] OR;   Service: Podiatry;  Laterality: Right;  Partial 5th ray amputation   AMPUTATION Right 08/26/2022   Procedure: MIDFOOT AMPUTATION RIGHT FOOT, BONE BIOPSY RIGHT FOOT, TENDO ACHILIES LENGTHENING;  Surgeon: Janit Thresa HERO, DPM;  Location: [facility] OR;  Service: Podiatry;  Laterality: Right;   APPLICATION OF WOUND VAC Right 08/26/2022   Procedure: APPLICATION OF WOUND VAC;  Surgeon: Janit Thresa HERO, DPM;  Location: [facility] OR;  Service: Podiatry;  Laterality: Right;   CORONARY ANGIOPLASTY WITH STENT PLACEMENT     HEMIARTHROPLASTY (BIPOLAR), HIP, DIRECT ANTERIOR APPROACH, FOR FRACTURE Right 02/02/2024   Procedure: HEMIARTHROPLASTY (BIPOLAR), HIP, DIRECT ANTERIOR APPROACH, FOR FRACTURE;  Surgeon: Onesimo Oneil LABOR, MD;  Location: [facility];  Service: Orthopedics;  Laterality: Right;   IR FLUORO GUIDE CV LINE RIGHT  08/27/2022   IR REMOVAL TUN CV CATH W/O FL  10/08/2022   IR US  GUIDE VASC ACCESS RIGHT  08/27/2022   IRRIGATION AND DEBRIDEMENT FOOT Right 05/25/2022   Procedure: IRRIGATION AND DEBRIDEMENT FOOT;  Surgeon: Gershon Donnice SAUNDERS, DPM;  Location: [facility] OR;  Service: Podiatry;  Laterality: Right;    Social History   Socioeconomic History   Marital status: Divorced    Spouse name: Not on file   Number of children: Not on file   Years of education: Not on file   Highest education level: Not on file  Occupational History   Not on file  Tobacco Use   Smoking status: Former    Current packs/day: 0.00  Types: Cigarettes    Quit date: 26    Years since quitting: 32.8   Smokeless tobacco: Never  Vaping Use   Vaping status: Never Used  Substance and Sexual Activity   Alcohol use: No   Drug use: No   Sexual activity: Not Currently  Other Topics Concern   Not on file  Social History Narrative   Not on file   Social Drivers of Health   Financial Resource Strain: Low Risk  (05/11/2023)   Overall Financial Resource Strain (CARDIA)    Difficulty of Paying Living Expenses: Not hard at all  Food Insecurity: No  Food Insecurity (01/31/2024)   Hunger Vital Sign    Worried About Running Out of Food in the Last Year: Never true    Ran Out of Food in the Last Year: Never true  Transportation Needs: No Transportation Needs (01/31/2024)   PRAPARE - Administrator, Civil Service (Medical): No    Lack of Transportation (Non-Medical): No  Physical Activity: Inactive (05/11/2023)   Exercise Vital Sign    Days of Exercise per Week: 0 days    Minutes of Exercise per Session: 0 min  Stress: No Stress Concern Present (05/11/2023)   [employer] of Occupational Health - Occupational Stress Questionnaire    Feeling of Stress : Not at all  Social Connections: Socially Isolated (01/31/2024)   Social Connection and Isolation Panel    Frequency of Communication with Friends and Family: More than three times a week    Frequency of Social Gatherings with Friends and Family: More than three times a week    Attends Religious Services: Never    Database Administrator or Organizations: No    Attends Banker Meetings: Never    Marital Status: Divorced  Catering Manager Violence: Not At Risk (01/31/2024)   Humiliation, Afraid, Rape, and Kick questionnaire    Fear of Current or Ex-Partner: No    Emotionally Abused: No    Physically Abused: No    Sexually Abused: No   Family History  Problem Relation Age of Onset   Cervical cancer Mother    Down syndrome Sister    Diabetes type II Neg Hx       VITAL SIGNS BP (!) 124/56   Pulse 74   Temp (!) 97.1 F (36.2 C)   Resp 20   Ht 6' (1.829 m)   Wt 173 lb 3.2 oz (78.6 kg)   SpO2 96%   BMI 23.49 kg/m   Outpatient Encounter Medications as of 02/06/2024  Medication Sig   insulin  aspart (NOVOLOG ) 100 UNIT/ML injection Inject 5 Units into the skin 3 (three) times daily with meals.   insulin  glargine (LANTUS ) 100 UNIT/ML injection Inject 10 Units into the skin at bedtime.   Accu-Chek Softclix Lancets lancets Use 4 times daily as directed to  check blood sugars.   acetaminophen  (TYLENOL ) 325 MG tablet Take 2 tablets (650 mg total) by mouth every 6 (six) hours as needed for mild pain (pain score 1-3), fever or headache (or Fever >/= 101).   amLODipine (NORVASC) 5 MG tablet Take 1 tablet (5 mg total) by mouth daily.   aspirin  EC 81 MG tablet Take 1 tablet (81 mg total) by mouth 2 (two) times daily. Swallow whole.   dextromethorphan  (DELSYM ) 30 MG/5ML liquid Take 5 mLs (30 mg total) by mouth 2 (two) times daily as needed for cough.   furosemide  (LASIX ) 40 MG tablet Take 1 tablet (40 mg  total) by mouth daily. **NEEDS TO BE SEEN BEFORE NEXT REFILL**   guaiFENesin  (MUCINEX ) 600 MG 12 hr tablet Take 1 tablet (600 mg total) by mouth 2 (two) times daily for 3 days.   ipratropium-albuterol  (DUONEB) 0.5-2.5 (3) MG/3ML SOLN Take 3 mLs by nebulization 3 (three) times daily.   oxyCODONE  (OXY IR/ROXICODONE ) 5 MG immediate release tablet Take 1 tablet (5 mg total) by mouth 3 (three) times daily as needed for up to 3 days for severe pain (pain score 7-10).   pantoprazole (PROTONIX) 40 MG tablet Take 1 tablet (40 mg total) by mouth every evening.   polyethylene glycol (MIRALAX  / GLYCOLAX ) 17 g packet Take 17 g by mouth daily.   [DISCONTINUED] glipiZIDE  (GLUCOTROL ) 5 MG tablet TAKE 1 TABLET (5 MG TOTAL) BY MOUTH DAILY BEFORE BREAKFAST. **NEEDS TO BE SEEN BEFORE NEXT REFILL**   [DISCONTINUED] insulin  aspart (NOVOLOG ) 100 UNIT/ML injection Inject 0-15 Units into the skin 3 (three) times daily with meals.   No facility-administered encounter medications on file as of 02/06/2024.     SIGNIFICANT DIAGNOSTIC EXAMS  LABS  01-31-24: wbc 7.2; hgb 16.1; hct 45.7; mcv 86.1 plt 148; glucose 224; bun 25; creat 1.60; k+ 3.8; na++ 139; ca 9.1 gfr 42 02-04-24: wbc 9.6; hgb 12.9; hct 37.5; mcv 88.7 plt 131; glucose 106; bun 30; creat 1.55; k+ 3.3; na++ 142; ca 8.8; gfr 44   Review of Systems  Constitutional:  Negative for malaise/fatigue.  Respiratory:  Negative  for cough and shortness of breath.   Cardiovascular:  Negative for chest pain, palpitations and leg swelling.  Gastrointestinal:  Negative for abdominal pain, constipation and heartburn.  Musculoskeletal:  Negative for back pain, joint pain and myalgias.  Skin: Negative.   Neurological:  Negative for dizziness.  Psychiatric/Behavioral:  The patient is not nervous/anxious.     Physical Exam Constitutional:      General: He is not in acute distress.    Appearance: He is well-developed. He is not diaphoretic.  HENT:     Nose: Nose normal.     Mouth/Throat:     Mouth: Mucous membranes are moist.     Pharynx: Oropharynx is clear.  Eyes:     Conjunctiva/sclera: Conjunctivae normal.  Neck:     Thyroid : No thyromegaly.  Cardiovascular:     Rate and Rhythm: Normal rate and regular rhythm.     Pulses: Normal pulses.     Heart sounds: Normal heart sounds.  Pulmonary:     Effort: Pulmonary effort is normal. No respiratory distress.     Breath sounds: Normal breath sounds.  Abdominal:     General: Bowel sounds are normal. There is no distension.     Palpations: Abdomen is soft.     Tenderness: There is no abdominal tenderness.  Musculoskeletal:        General: Normal range of motion.     Cervical back: Normal range of motion and neck supple.     Comments: Status post right hip arthroplasty History of right ray amputation  Lymphadenopathy:     Cervical: No cervical adenopathy.  Skin:    General: Skin is warm and dry.  Neurological:     Mental Status: He is alert. Mental status is at baseline.  Psychiatric:        Mood and Affect: Mood normal.       ASSESSMENT/ PLAN:  TODAY  Closed fracture of right hip with routine healing subsequent encounter: will continue therapy as directed; will follow up with orthopedics.  Has oxycodone  5 mg three times daily as needed through 02-07-24. Is taking asa 81 mg twice daily   2. Hypertension in stage 3 chronic kidney disease due to type 2  diabetes mellitus: b/p 124/56: will continue norvasc 5 mg daily; asa 81 mg twice daily  3. Chronic diastolic (CHF) congestive heart failure: is on lasix  40 mg daily   4. 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 have stopped the glipizide  as this medication is not recommended to the geriatric population. Will continue lantus  10 units nightly; aspart 5 units with meals.   5. Chronic constipation: will continue miralax  daily  6. CKD stage 3 due to type 2 diabetes mellitus: bun 30; creat 1.55  7. 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 GERD without esophagitis: will continue protonix 40 mg daily   9. Aortic atherosclerosis: (cxr 02-03-24) is on asa   Will check cbc; cmp    Barnie Seip NP [facility]   call [phone]

## 2024-02-07 ENCOUNTER — Encounter: Payer: Self-pay | Admitting: Internal Medicine

## 2024-02-07 ENCOUNTER — Non-Acute Institutional Stay (SKILLED_NURSING_FACILITY): Admitting: Internal Medicine

## 2024-02-07 DIAGNOSIS — D631 Anemia in chronic kidney disease: Secondary | ICD-10-CM

## 2024-02-07 DIAGNOSIS — Z794 Long term (current) use of insulin: Secondary | ICD-10-CM

## 2024-02-07 DIAGNOSIS — F01C0 Vascular dementia, severe, without behavioral disturbance, psychotic disturbance, mood disturbance, and anxiety: Secondary | ICD-10-CM

## 2024-02-07 DIAGNOSIS — E1151 Type 2 diabetes mellitus with diabetic peripheral angiopathy without gangrene: Secondary | ICD-10-CM

## 2024-02-07 DIAGNOSIS — I10 Essential (primary) hypertension: Secondary | ICD-10-CM | POA: Diagnosis not present

## 2024-02-07 DIAGNOSIS — N1831 Chronic kidney disease, stage 3a: Secondary | ICD-10-CM

## 2024-02-07 DIAGNOSIS — S72001D Fracture of unspecified part of neck of right femur, subsequent encounter for closed fracture with routine healing: Secondary | ICD-10-CM | POA: Diagnosis not present

## 2024-02-07 NOTE — Assessment & Plan Note (Signed)
 Prior to orthopedic surgery H/H was normal with values of 16.1/45.7; postop values of 12.9/37.5. GFR 44 documents CKD high stage IIIb.  Med list reviewed; no indication change in meds or dosages at this time.  Continue to monitor.

## 2024-02-07 NOTE — Progress Notes (Unsigned)
 NURSING HOME LOCATION:  [facility] ROOM NUMBER: [id number]  CODE STATUS: DNR  PCP: Rock HERO. Rakes, FNP  This is a comprehensive admission note to this SNFperformed on this date less than 30 days from date of admission. Included are preadmission medical/surgical history; reconciled medication list; family history; social history and comprehensive review of systems.  Corrections and additions to the records were documented. Comprehensive physical exam was also performed. Additionally a clinical summary was entered for each active diagnosis pertinent to this admission in the Problem List to enhance continuity of care.  HPI: He was hospitalized 11/4 - 02/04/2024 presenting to the ED after a fall.  Mechanical fall was suggested; he apparently lost his balance and fell when he tried to stand, but did not have grasp of his walker.  He does describe some dizziness upon standing over the past month PTA. In the ED significant systolic hypertension was present with blood pressures ranging from 150-227 systolic.  Pelvic x-ray revealed mildly displaced subcapital fracture of the right femoral neck. Bipolar hemiarthroplasty was performed 11/6 by Dr. Onesimo.  DVT prophylaxis was to be 81 mg of aspirin  twice daily.  Preop H/H was 16.1/45.7  and final values 12.9/37.5. As needed labetalol  10 mg was recommended for systolic blood pressures greater than 160.  Amlodipine 5 mg was added. Sliding scale insulin  was administered for diabetes.  Glargine 12 units as basal insulin  and NovoLog  3 units 3 times daily with meals was prescribed with SSI titrations based on glucoses.  Glipizide  was held . A1c is 7.6%.  Glucoses while hospitalized ranged from 106 up to a high of 259, both were outliers.Creatinine ranged from 1.45 up to high of 1.67.  Final eGFR was 44 indicating high stage IIIb CKD. PT/OT consulted and recommended SNF placement for rehab.  Past medical and surgical history: Includes history of AKI;  anemia of chronic disease; chronic systolic congestive heart failure; CAD; diabetes with CKD; essential hypertension; PAD; peripheral neuropathy; history of subdural hematoma; and severe pulmonary hypertension. Surgeries and procedures include ray amputation x 2; abdominal aortogram; and coronary angioplasty with stent placement.  Family history: reviewed, non contributory due to advanced age.  Social history: Nondrinker; former smoker.   Review of systems: He states that he is not too good indicating ongoing pain @ operative site.  He says states that he has no idea why he fell although he has been having some dizzy spells over the past 4 months when he arises.  He denies any definite cardiac or neurologic prodrome prior to the fall.  He does have occasional dysphagia.  He bruises easily but denies any other bleeding dyscrasias.   Constitutional: No fever, significant weight change, fatigue  Eyes: No redness, discharge, pain, vision change ENT/mouth: No nasal congestion, purulent discharge, earache, change in hearing, sore throat  Cardiovascular: No chest pain, palpitations, paroxysmal nocturnal dyspnea, claudication, edema  Respiratory: No cough, sputum production, hemoptysis, DOE, significant snoring, apnea  Gastrointestinal: No heartburn, abdominal pain, nausea /vomiting, rectal bleeding, melena, change in bowels Genitourinary: No dysuria, hematuria, pyuria, incontinence, nocturia Dermatologic: No rash, pruritus, change in appearance of skin Neurologic: No headache, syncope, seizures, numbness, tingling Psychiatric: No significant anxiety, depression, insomnia, anorexia Endocrine: No change in hair/skin/nails, excessive thirst, excessive hunger, excessive urination  Hematologic/lymphatic: No significant lymphadenopathy, abnormal bleeding Allergy/immunology: No itchy/watery eyes, significant sneezing, urticaria, angioedema  Physical exam:  Pertinent or positive findings: He appears  his age.  Pattern alopecia is present.  He is hard of hearing.  Slight ptosis is present bilaterally.  Lower lids are puffy.  There is no nystagmus and extraocular motion is normal.  He has an upper dental plate.  He has an anterior mandibular bridge with fractured teeth and teeth carious to the gumline.  Breath sounds are decreased.  There is slight increase in second heart sound.  Abdomen slightly protuberant.  Pedal pulses cannot be palpated.  He has interosseous wasting.  General appearance: no acute distress, increased work of breathing is present.   Lymphatic: No lymphadenopathy about the head, neck, axilla. Eyes: No conjunctival inflammation or lid edema is present. There is no scleral icterus. Ears:  External ear exam shows no significant lesions or deformities.   Nose:  External nasal examination shows no deformity or inflammation. Nasal mucosa are pink and moist without lesions, exudates Neck:  No thyromegaly, masses, tenderness noted.    Heart:  Normal rate and regular rhythm without gallop, murmur, click, rub.  Lungs:  without wheezes, rhonchi, rales, rubs. Abdomen: Bowel sounds are normal.  Abdomen is soft and nontender with no organomegaly, hernias, masses. GU: Deferred  Extremities:  No cyanosis, clubbing, edema. Neurologic exam:  Balance, Rhomberg, finger to nose testing could not be completed due to clinical state Skin: Warm & dry w/o tenting. No significant lesions or rash.  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  Glipizide  held; basal and AC insulins ordered. With advanced age and multiple comorbidities; A1c goal is 8% or less.  Critical is to avoid hypoglycemia which would mimic TIAs.  Accelerated hypertension BP controlled; no change in antihypertensive  medications   Anemia in stage 3a chronic kidney disease (HCC) Prior to orthopedic surgery H/H was normal with values of 16.1/45.7; postop values of 12.9/37.5. GFR 44 documents CKD high stage IIIb.  Med list reviewed; no indication change in meds or dosages at this time.  Continue to monitor.  Dementia (HCC) Update of MMSE to be performed @ SNF.  Closed right hip fracture (HCC) PT/OT as tolerated. Ortho F/U as scheduled.

## 2024-02-07 NOTE — Assessment & Plan Note (Signed)
 11/4 - 02/04/2024 A1c 7.6%; glucoses ranged from 106 up to 259, both outliers.  Glipizide  held; basal and AC insulins ordered. With advanced age and multiple comorbidities; A1c goal was 8%.  Critical is to avoid hypoglycemia which would mimic TIAs.

## 2024-02-07 NOTE — Patient Instructions (Signed)
 See assessment and plan under each diagnosis in the problem list and acutely for this visit

## 2024-02-07 NOTE — Assessment & Plan Note (Signed)
 BP controlled; no change in antihypertensive medications

## 2024-02-08 NOTE — Assessment & Plan Note (Signed)
 Update of MMSE to be performed @ SNF.

## 2024-02-09 ENCOUNTER — Other Ambulatory Visit (HOSPITAL_COMMUNITY)
Admission: RE | Admit: 2024-02-09 | Discharge: 2024-02-09 | Disposition: A | Discharge status: Home / Self Care | Source: Skilled Nursing Facility | Attending: Adult Health | Admitting: Adult Health

## 2024-02-09 ENCOUNTER — Inpatient Hospital Stay: Admit: 2024-02-09

## 2024-02-09 DIAGNOSIS — I129 Hypertensive chronic kidney disease with stage 1 through stage 4 chronic kidney disease, or unspecified chronic kidney disease: Secondary | ICD-10-CM | POA: Insufficient documentation

## 2024-02-09 LAB — COMPREHENSIVE METABOLIC PANEL WITH GFR
ALT: 16 U/L (ref 0–44)
AST: 27 U/L (ref 15–41)
Albumin: 3.3 g/dL — ABNORMAL LOW (ref 3.5–5.0)
Alkaline Phosphatase: 112 U/L (ref 38–126)
Anion gap: 11 (ref 5–15)
BUN: 32 mg/dL — ABNORMAL HIGH (ref 8–23)
CO2: 27 mmol/L (ref 22–32)
Calcium: 8.2 mg/dL — ABNORMAL LOW (ref 8.9–10.3)
Chloride: 102 mmol/L (ref 98–111)
Creatinine, Ser: 1.52 mg/dL — ABNORMAL HIGH (ref 0.61–1.24)
GFR, Estimated: 45 mL/min — ABNORMAL LOW (ref >60)
Glucose, Bld: 134 mg/dL — ABNORMAL HIGH (ref 70–99)
Potassium: 3.5 mmol/L (ref 3.5–5.1)
Sodium: 141 mmol/L (ref 135–145)
Total Bilirubin: 0.7 mg/dL (ref 0.0–1.2)
Total Protein: 5.7 g/dL — ABNORMAL LOW (ref 6.5–8.1)

## 2024-02-09 LAB — CBC
HCT: 37.2 % — ABNORMAL LOW (ref 39.0–52.0)
Hemoglobin: 12.5 g/dL — ABNORMAL LOW (ref 13.0–17.0)
MCH: 30.1 pg (ref 26.0–34.0)
MCHC: 33.6 g/dL (ref 30.0–36.0)
MCV: 89.6 fL (ref 80.0–100.0)
Platelets: 212 K/uL (ref 150–400)
RBC: 4.15 MIL/uL — ABNORMAL LOW (ref 4.22–5.81)
RDW: 12.4 % (ref 11.5–15.5)
WBC: 6.1 K/uL (ref 4.0–10.5)
nRBC: 0 % (ref 0.0–0.2)

## 2024-02-10 ENCOUNTER — Other Ambulatory Visit (HOSPITAL_COMMUNITY)
Admission: RE | Admit: 2024-02-10 | Discharge: 2024-02-10 | Disposition: A | Discharge status: Home / Self Care | Source: Skilled Nursing Facility | Attending: Adult Health | Admitting: Adult Health

## 2024-02-10 DIAGNOSIS — E1122 Type 2 diabetes mellitus with diabetic chronic kidney disease: Secondary | ICD-10-CM | POA: Insufficient documentation

## 2024-02-10 LAB — BASIC METABOLIC PANEL WITH GFR
Anion gap: 10 (ref 5–15)
BUN: 24 mg/dL — ABNORMAL HIGH (ref 8–23)
CO2: 28 mmol/L (ref 22–32)
Calcium: 8.4 mg/dL — ABNORMAL LOW (ref 8.9–10.3)
Chloride: 102 mmol/L (ref 98–111)
Creatinine, Ser: 1.23 mg/dL (ref 0.61–1.24)
GFR, Estimated: 58 mL/min — ABNORMAL LOW (ref >60)
Glucose, Bld: 152 mg/dL — ABNORMAL HIGH (ref 70–99)
Potassium: 3.4 mmol/L — ABNORMAL LOW (ref 3.5–5.1)
Sodium: 139 mmol/L (ref 135–145)

## 2024-02-10 LAB — CBC
HCT: 35 % — ABNORMAL LOW (ref 39.0–52.0)
Hemoglobin: 12.1 g/dL — ABNORMAL LOW (ref 13.0–17.0)
MCH: 30.3 pg (ref 26.0–34.0)
MCHC: 34.6 g/dL (ref 30.0–36.0)
MCV: 87.5 fL (ref 80.0–100.0)
Platelets: 216 K/uL (ref 150–400)
RBC: 4 MIL/uL — ABNORMAL LOW (ref 4.22–5.81)
RDW: 12.3 % (ref 11.5–15.5)
WBC: 5.5 K/uL (ref 4.0–10.5)
nRBC: 0 % (ref 0.0–0.2)

## 2024-02-10 NOTE — Assessment & Plan Note (Signed)
 PT/OT as tolerated. Ortho F/U as scheduled.

## 2024-02-17 ENCOUNTER — Ambulatory Visit (INDEPENDENT_AMBULATORY_CARE_PROVIDER_SITE_OTHER): Admitting: Orthopedic Surgery

## 2024-02-17 ENCOUNTER — Other Ambulatory Visit (INDEPENDENT_AMBULATORY_CARE_PROVIDER_SITE_OTHER): Payer: Self-pay

## 2024-02-17 ENCOUNTER — Encounter: Payer: Self-pay | Admitting: Orthopedic Surgery

## 2024-02-17 DIAGNOSIS — Z96641 Presence of right artificial hip joint: Secondary | ICD-10-CM

## 2024-02-17 NOTE — Progress Notes (Signed)
 Orthopaedic Postop Note  Assessment: Wayne [name] is a 83 y.o. male s/p Right hip hemiarthroplasty anterior approach  DOS: 02/02/2024  Plan: Kurtis were removed, steri strips placed Continue anterior hip precautions Continue with DVT prophylaxis for at least 6 weeks after surgery WBAT on the operative extremity Follow up in 4 weeks; call with any issues  No orders of the defined types were placed in this encounter.    Follow-up: Return in about 4 weeks (around 03/16/2024). XR at next visit: AP pelvis and Right hip  Subjective:  Chief Complaint  Patient presents with   Routine Post Op    R hip DOS 02/02/24    History of Present Illness: Wayne [name] is a 83 y.o. male who presents following the above stated procedure.  He remains in a nursing facility.  He is ambulating with a walker.  He continues to work with therapy.  He is not complaining of pain.  He does demonstrate some weakness.    Review of Systems: No fevers or chills No numbness or tingling No Chest Pain No shortness of breath   Objective: There were no vitals taken for this visit.  Physical Exam:  Pleasant.  Ambulates with the assistance of a walker  Anterior based hip incision is clean, dry and intact.  No surrounding erythema or drainage.  Tolerates gentle range of motion.  Able to maintain straight leg raise.  No pain with axial loading.  Sensation intact distally.  IMAGING: I personally ordered and reviewed the following images:  XR of the Right hip and AP pelvis demonstrates a hip arthroplasty in good position.  The hip remains reduced.  There is no evidence of implant subsidence.  No acute fractures are noted.  Impression: Right hip hemiarthroplasty in stable position, without evidence of migration or subsidence compared to prior XR   [name], MD 02/17/2024 10:01 AM

## 2024-02-22 ENCOUNTER — Other Ambulatory Visit: Payer: Self-pay | Admitting: Adult Health

## 2024-02-22 ENCOUNTER — Encounter: Payer: Self-pay | Admitting: Adult Health

## 2024-02-22 ENCOUNTER — Non-Acute Institutional Stay (SKILLED_NURSING_FACILITY): Payer: Self-pay | Admitting: Adult Health

## 2024-02-22 DIAGNOSIS — N183 Chronic kidney disease, stage 3 unspecified: Secondary | ICD-10-CM

## 2024-02-22 DIAGNOSIS — I129 Hypertensive chronic kidney disease with stage 1 through stage 4 chronic kidney disease, or unspecified chronic kidney disease: Secondary | ICD-10-CM

## 2024-02-22 DIAGNOSIS — S72001D Fracture of unspecified part of neck of right femur, subsequent encounter for closed fracture with routine healing: Secondary | ICD-10-CM

## 2024-02-22 DIAGNOSIS — F01C0 Vascular dementia, severe, without behavioral disturbance, psychotic disturbance, mood disturbance, and anxiety: Secondary | ICD-10-CM | POA: Diagnosis not present

## 2024-02-22 DIAGNOSIS — E1122 Type 2 diabetes mellitus with diabetic chronic kidney disease: Secondary | ICD-10-CM

## 2024-02-22 DIAGNOSIS — Z794 Long term (current) use of insulin: Secondary | ICD-10-CM

## 2024-02-22 DIAGNOSIS — I5043 Acute on chronic combined systolic (congestive) and diastolic (congestive) heart failure: Secondary | ICD-10-CM

## 2024-02-22 MED ORDER — ACCU-CHEK SOFTCLIX LANCETS MISC: 0 refills | Status: AC

## 2024-02-22 MED ORDER — INSULIN ASPART 100 UNIT/ML IJ SOLN: 5.0000 [IU] | Freq: Three times a day (TID) | INTRAMUSCULAR | 0 refills | Status: DC

## 2024-02-22 MED ORDER — INSULIN PEN NEEDLE 30G X 5 MM MISC: 1.0000 | Freq: Four times a day (QID) | 0 refills | Status: DC

## 2024-02-22 MED ORDER — IPRATROPIUM-ALBUTEROL 0.5-2.5 (3) MG/3ML IN SOLN: 3.0000 mL | Freq: Three times a day (TID) | RESPIRATORY_TRACT | 0 refills | Status: DC

## 2024-02-22 MED ORDER — AMLODIPINE BESYLATE 5 MG PO TABS: 5.0000 mg | ORAL_TABLET | Freq: Every day | ORAL | 0 refills | Status: AC

## 2024-02-22 MED ORDER — FUROSEMIDE 40 MG PO TABS: 40.0000 mg | ORAL_TABLET | Freq: Every day | ORAL | 0 refills | Status: AC

## 2024-02-22 MED ORDER — INSULIN GLARGINE 100 UNIT/ML ~~LOC~~ SOLN: 10.0000 [IU] | Freq: Every day | SUBCUTANEOUS | 0 refills | Status: DC

## 2024-02-22 MED ORDER — PANTOPRAZOLE SODIUM 40 MG PO TBEC: 40.0000 mg | DELAYED_RELEASE_TABLET | Freq: Every evening | ORAL | 0 refills | Status: DC

## 2024-02-22 NOTE — Progress Notes (Signed)
Location:  [facility] Room Number: South/136/P Place of Service:  SNF (31)   CODE STATUS: DNR  No Known Allergies  Chief Complaint  Patient presents with   Discharge Note    HPI:  He is being discharged to home with home health for pt/ot. He will not need any dme; will need his prescriptions written and will need to follow up with his medical provider. He had been hospitalized after mechanical fall at home and suffered a right femoral neck fracture. He had a hemiarthroplasty on 02-02-24. There were no surgical complications. He was admitted to this facility for short term rehab: therapy: ambulate 80 feet with a rolling walker and contact guard assist. Upper body with supervision; lower body with min assist; brp: with min assist.   Past Medical History:  Diagnosis Date   AKI (acute kidney injury) 05/21/2022   Anemia of chronic disease    Chronic HFrEF (heart failure with reduced ejection fraction) (HCC)    Chronic kidney disease, stage 3a (HCC)    Coronary artery disease    Dr. Tisa years ago. No details.   Diabetes mellitus with circulatory complication (HCC)    Diabetic foot infection (HCC)    Encephalopathy    Former tobacco use    Hypertension    Lactic acidosis 11/28/2021   Multifocal pneumonia 11/28/2021   PAD (peripheral artery disease)    Peripheral neuropathy    SDH (subdural hematoma) (HCC)    Severe pulmonary hypertension (HCC)    Subdural hematoma (HCC) 11/28/2021    Past Surgical History:  Procedure Laterality Date   ABDOMINAL AORTOGRAM W/LOWER EXTREMITY Right 11/13/2021   Procedure: ABDOMINAL AORTOGRAM W/LOWER EXTREMITY;  Surgeon: Magda Debby SAILOR, MD;  Location: [facility] INVASIVE CV [facility];  Service: Cardiovascular;  Laterality: Right;   AMPUTATION Right 05/23/2022   Procedure: AMPUTATION RAY;  Surgeon: Malvin Marsa FALCON, DPM;  Location: [facility] OR;  Service: Podiatry;  Laterality: Right;  Partial 5th ray amputation   AMPUTATION Right  08/26/2022   Procedure: MIDFOOT AMPUTATION RIGHT FOOT, BONE BIOPSY RIGHT FOOT, TENDO ACHILIES LENGTHENING;  Surgeon: Janit Thresa HERO, DPM;  Location: [facility] OR;  Service: Podiatry;  Laterality: Right;   APPLICATION OF WOUND VAC Right 08/26/2022   Procedure: APPLICATION OF WOUND VAC;  Surgeon: Janit Thresa HERO, DPM;  Location: [facility] OR;  Service: Podiatry;  Laterality: Right;   CORONARY ANGIOPLASTY WITH STENT PLACEMENT     HEMIARTHROPLASTY (BIPOLAR), HIP, DIRECT ANTERIOR APPROACH, FOR FRACTURE Right 02/02/2024   Procedure: HEMIARTHROPLASTY (BIPOLAR), HIP, DIRECT ANTERIOR APPROACH, FOR FRACTURE;  Surgeon: Onesimo Oneil LABOR, MD;  Location: [facility] ORS;  Service: Orthopedics;  Laterality: Right;   IR FLUORO GUIDE CV LINE RIGHT  08/27/2022   IR REMOVAL TUN CV CATH W/O FL  10/08/2022   IR US  GUIDE VASC ACCESS RIGHT  08/27/2022   IRRIGATION AND DEBRIDEMENT FOOT Right 05/25/2022   Procedure: IRRIGATION AND DEBRIDEMENT FOOT;  Surgeon: Gershon Donnice SAUNDERS, DPM;  Location: [facility] OR;  Service: Podiatry;  Laterality: Right;    Social History   Socioeconomic History   Marital status: Divorced    Spouse name: Not on file   Number of children: Not on file   Years of education: Not on file   Highest education level: Not on file  Occupational History   Not on file  Tobacco Use   Smoking status: Former    Current packs/day: 0.00    Types: Cigarettes    Quit date: 1993    Years since quitting:  32.9   Smokeless tobacco: Never  Vaping Use   Vaping status: Never Used  Substance and Sexual Activity   Alcohol use: No   Drug use: No   Sexual activity: Not Currently  Other Topics Concern   Not on file  Social History Narrative   Not on file   Social Drivers of Health   Financial Resource Strain: Low Risk  (05/11/2023)   Overall Financial Resource Strain (CARDIA)    Difficulty of Paying Living Expenses: Not hard at all  Food Insecurity: No Food Insecurity (01/31/2024)   Hunger Vital Sign    Worried About Running Out of  Food in the Last Year: Never true    Ran Out of Food in the Last Year: Never true  Transportation Needs: No Transportation Needs (01/31/2024)   PRAPARE - Administrator, Civil Service (Medical): No    Lack of Transportation (Non-Medical): No  Physical Activity: Inactive (05/11/2023)   Exercise Vital Sign    Days of Exercise per Week: 0 days    Minutes of Exercise per Session: 0 min  Stress: No Stress Concern Present (05/11/2023)   [employer] of Occupational Health - Occupational Stress Questionnaire    Feeling of Stress : Not at all  Social Connections: Socially Isolated (01/31/2024)   Social Connection and Isolation Panel    Frequency of Communication with Friends and Family: More than three times a week    Frequency of Social Gatherings with Friends and Family: More than three times a week    Attends Religious Services: Never    Database Administrator or Organizations: No    Attends Banker Meetings: Never    Marital Status: Divorced  Catering Manager Violence: Not At Risk (01/31/2024)   Humiliation, Afraid, Rape, and Kick questionnaire    Fear of Current or Ex-Partner: No    Emotionally Abused: No    Physically Abused: No    Sexually Abused: No   Family History  Problem Relation Age of Onset   Cervical cancer Mother    Down syndrome Sister    Diabetes type II Neg Hx       VITAL SIGNS BP 134/72   Pulse 74   Temp 97.7 F (36.5 C)   Resp 20   Ht 6' (1.829 m)   Wt 173 lb (78.5 kg)   SpO2 97%   BMI 23.46 kg/m   Outpatient Encounter Medications as of 02/22/2024  Medication Sig   acetaminophen  (TYLENOL ) 325 MG tablet Take 2 tablets (650 mg total) by mouth every 6 (six) hours as needed for mild pain (pain score 1-3), fever or headache (or Fever >/= 101).   amLODipine  (NORVASC ) 5 MG tablet Take 1 tablet (5 mg total) by mouth daily.   aspirin  EC 81 MG tablet Take 1 tablet (81 mg total) by mouth 2 (two) times daily. Swallow whole.    dextromethorphan  (DELSYM ) 30 MG/5ML liquid Take 5 mLs (30 mg total) by mouth 2 (two) times daily as needed for cough.   furosemide  (LASIX ) 40 MG tablet Take 1 tablet (40 mg total) by mouth daily. **NEEDS TO BE SEEN BEFORE NEXT REFILL**   insulin  aspart (NOVOLOG ) 100 UNIT/ML injection Inject 5 Units into the skin 3 (three) times daily with meals.   insulin  glargine (LANTUS ) 100 UNIT/ML injection Inject 10 Units into the skin at bedtime.   Insulin  Pen Needle 30G X 5 MM MISC by Does not apply route.   ipratropium-albuterol  (DUONEB) 0.5-2.5 (3) MG/3ML SOLN  Take 3 mLs by nebulization 3 (three) times daily.   pantoprazole  (PROTONIX ) 40 MG tablet Take 1 tablet (40 mg total) by mouth every evening.   polyethylene glycol (MIRALAX  / GLYCOLAX ) 17 g packet Take 17 g by mouth daily.   Accu-Chek Softclix Lancets lancets Use 4 times daily as directed to check blood sugars. (Patient not taking: Reported on 02/22/2024)   No facility-administered encounter medications on file as of 02/22/2024.     SIGNIFICANT DIAGNOSTIC EXAMS  LABS  01-31-24: wbc 7.2; hgb 16.1; hct 45.7; mcv 86.1 plt 148; glucose 224; bun 25; creat 1.60; k+ 3.8; na++ 139; ca 9.1 gfr 42 02-04-24: wbc 9.6; hgb 12.9; hct 37.5; mcv 88.7 plt 131; glucose 106; bun 30; creat 1.55; k+ 3.3; na++ 142; ca 8.8; gfr 44   Review of Systems  Constitutional:  Negative for malaise/fatigue.  Respiratory:  Negative for cough and shortness of breath.   Cardiovascular:  Negative for chest pain, palpitations and leg swelling.  Gastrointestinal:  Negative for abdominal pain, constipation and heartburn.  Musculoskeletal:  Negative for back pain, joint pain and myalgias.  Skin: Negative.   Neurological:  Negative for dizziness.  Psychiatric/Behavioral:  The patient is not nervous/anxious.     Physical Exam Constitutional:      General: He is not in acute distress.    Appearance: He is well-developed. He is not diaphoretic.  Neck:     Thyroid : No  thyromegaly.  Cardiovascular:     Rate and Rhythm: Normal rate and regular rhythm.     Heart sounds: Normal heart sounds.  Pulmonary:     Effort: Pulmonary effort is normal. No respiratory distress.     Breath sounds: Normal breath sounds.  Abdominal:     General: Bowel sounds are normal. There is no distension.     Palpations: Abdomen is soft.     Tenderness: There is no abdominal tenderness.  Musculoskeletal:     Cervical back: Neck supple.     Right lower leg: No edema.     Left lower leg: No edema.     Comments: Status post right hip arthroplasty History of right ray amputation   Lymphadenopathy:     Cervical: No cervical adenopathy.  Skin:    General: Skin is warm and dry.  Neurological:     Mental Status: He is alert. Mental status is at baseline.  Psychiatric:        Mood and Affect: Mood normal.    ASSESSMENT/ PLAN:   Patient is being discharged with the following home health services:  pt/ot to evaluate and treat as indicated for gait balance strength adl training.   Patient is being discharged with the following durable medical equipment:  none needed   Patient has been advised to f/u with their PCP in 1-2 weeks to for a transitions of care visit.  Social services at their facility was responsible for arranging this appointment.  Pt was provided with adequate prescriptions of noncontrolled medications to reach the scheduled appointment .  For controlled substances, a limited supply was provided as appropriate for the individual patient.  If the pt normally receives these medications from a pain clinic or has a contract with another physician, these medications should be received from that clinic or physician only).     A 30 day supply of his prescription medications have been sent to [facility]  Time spent with patient: 35 minutes: dme; medications; home health   Barnie Seip NP [facility]  call  [phone]   

## 2024-02-22 NOTE — Telephone Encounter (Signed)
 Shows its not covered.

## 2024-02-27 ENCOUNTER — Other Ambulatory Visit: Payer: Self-pay | Admitting: Adult Health

## 2024-02-27 ENCOUNTER — Telehealth: Payer: Self-pay

## 2024-02-27 NOTE — Transitions of Care (Post Inpatient/ED Visit) (Unsigned)
 02/27/2024  Name: Wayne [name] MRN: [id number] DOB: 13-Apr-1940  Today's TOC FU Call Status: Today's TOC FU Call Status:: Successful TOC FU Call Completed TOC FU Call Complete Date: 02/27/24  Patient's Name and Date of Birth confirmed. Name, DOB  Transition Care Management Follow-up Telephone Call Date of Discharge: 02/23/24 Discharge Facility: Other [facility]) Name of Other ([facility]) Discharge Facility: [facility] Type of Discharge: Inpatient Admission Primary Inpatient Discharge Diagnosis:: right hip fracture How have you been since you were released from the hospital?: Better Any questions or concerns?: Yes Patient Questions/Concerns:: daughter states that glucose was 265 this morning before breakfast. Per daughter-physical Therapy suggested PCP order a walker, and scooter. Patient Questions/Concerns Addressed: Notified Provider of Patient Questions/Concerns  Items Reviewed: Did you receive and understand the discharge instructions provided?: Yes Medications obtained,verified, and reconciled?: Yes (Medications Reviewed) Any new allergies since your discharge?: No Dietary orders reviewed?: NA Do you have support at home?: Yes People in Home [RPT]: child(ren), adult Name of Support/Comfort Primary Source: Wayne [name]  Medications Reviewed Today: Medications Reviewed Today     Reviewed by Lang Avelina PARAS, CMA (Certified Medical Assistant) on 02/27/24 at 1049  Med List Status: <None>   Medication Order Taking? Sig Documenting Provider Last Dose Status Informant  Accu-Chek Softclix Lancets lancets [id number] Yes Use 4 times daily as directed to check blood sugars. Wayne Barnie RAMAN, NP  Active   acetaminophen  (TYLENOL ) 325 MG tablet [id number] Yes Take 2 tablets (650 mg total) by mouth every 6 (six) hours as needed for mild pain (pain score 1-3), fever or headache (or Fever >/= 101). [name], Wayne L, MD  Active   amLODipine  (NORVASC ) 5 MG tablet [id number]  Yes Take 1 tablet (5 mg total) by mouth daily. Wayne Barnie RAMAN, NP  Active   aspirin  EC 81 MG tablet [id number]  Take 1 tablet (81 mg total) by mouth 2 (two) times daily. Swallow whole.  Patient not taking: Reported on 02/27/2024   Wayne Afton CROME, MD  Active   dextromethorphan  (DELSYM ) 30 MG/5ML liquid [id number] Yes Take 5 mLs (30 mg total) by mouth 2 (two) times daily as needed for cough. [name], Wayne L, MD  Active   furosemide  (LASIX ) 40 MG tablet [id number] Yes Take 1 tablet (40 mg total) by mouth daily. **NEEDS TO BE SEEN BEFORE NEXT REFILLTHORA Wayne Barnie RAMAN, NP  Active   insulin  aspart (NOVOLOG  FLEXPEN) 100 UNIT/ML FlexPen [id number] Yes INJECT 5 UNITS INTO THE SKIN 3 (THREE) TIMES DAILY WITH MEALS. Wayne Barnie RAMAN, NP  Active   insulin  glargine (LANTUS ) 100 UNIT/ML injection [id number] Yes Inject 0.1 mLs (10 Units total) into the skin at bedtime. Wayne Barnie RAMAN, NP  Active   Insulin  Pen Needle 30G X 5 MM MISC [id number] Yes 1 Application by Does not apply route 4 (four) times daily. Wayne Barnie RAMAN, NP  Active   ipratropium-albuterol  (DUONEB) 0.5-2.5 (3) MG/3ML SOLN [id number] Yes Take 3 mLs by nebulization 3 (three) times daily. Wayne Barnie RAMAN, NP  Active   pantoprazole  (PROTONIX ) 40 MG tablet [id number] Yes Take 1 tablet (40 mg total) by mouth every evening. Wayne Barnie RAMAN, NP  Active   polyethylene glycol (MIRALAX  / GLYCOLAX ) 17 g packet [id number] Yes Take 17 g by mouth daily. Wayne Afton CROME, MD  Active             Home Care and Equipment/Supplies: Were Home Health Services Ordered?: NA Any new equipment or medical supplies ordered?: NA  Functional Questionnaire: Do  you need assistance with bathing/showering or dressing?: No Do you need assistance with meal preparation?: No Do you need assistance with eating?: No Do you have difficulty maintaining continence: No Do you need assistance with getting out of bed/getting out of a chair/moving?: No Do you have  difficulty managing or taking your medications?: No  Follow up appointments reviewed: PCP Follow-up appointment confirmed?: Yes Date of PCP follow-up appointment?: 03/15/24 Follow-up Provider: Rock Bruns, FNP Specialist Hospital Follow-up appointment confirmed?: Yes Date of Specialist follow-up appointment?: 03/20/24 Follow-Up Specialty Provider:: Wayne Oneil LABOR, MD Do you need transportation to your follow-up appointment?: No Do you understand care options if your condition(s) worsen?: Yes-patient verbalized understanding    SIGNATURE Avelina Essex, CMA (AAMA)  [facility] [phone]

## 2024-02-28 ENCOUNTER — Ambulatory Visit: Admitting: Family Medicine

## 2024-02-29 ENCOUNTER — Telehealth: Payer: Self-pay | Admitting: Orthopedic Surgery

## 2024-02-29 NOTE — Telephone Encounter (Signed)
 Dr. Onesimo pt GLENWOOD Piles OT w/[facility] [phone] lvm stating he needs to know what type of hip surgery the pt had and if there are any precautions.

## 2024-03-01 NOTE — Telephone Encounter (Signed)
Left VM with information.  

## 2024-03-05 ENCOUNTER — Emergency Department (HOSPITAL_COMMUNITY)
Admission: EM | Admit: 2024-03-05 | Discharge: 2024-03-05 | Disposition: A | Discharge status: Home / Self Care | Attending: Emergency Medicine | Admitting: Emergency Medicine

## 2024-03-05 ENCOUNTER — Encounter: Payer: Self-pay | Admitting: Family Medicine

## 2024-03-05 ENCOUNTER — Ambulatory Visit: Payer: Self-pay

## 2024-03-05 ENCOUNTER — Other Ambulatory Visit: Payer: Self-pay

## 2024-03-05 ENCOUNTER — Emergency Department (HOSPITAL_COMMUNITY)

## 2024-03-05 ENCOUNTER — Encounter (HOSPITAL_COMMUNITY): Payer: Self-pay | Admitting: Emergency Medicine

## 2024-03-05 ENCOUNTER — Telehealth: Payer: Self-pay | Admitting: Family Medicine

## 2024-03-05 DIAGNOSIS — L03116 Cellulitis of left lower limb: Secondary | ICD-10-CM

## 2024-03-05 LAB — LACTIC ACID, PLASMA
Lactic Acid, Venous: 1.5 mmol/L (ref 0.5–1.9)
Lactic Acid, Venous: 2.8 mmol/L (ref 0.5–1.9)

## 2024-03-05 LAB — SEDIMENTATION RATE: Sed Rate: 41 mm/h — ABNORMAL HIGH (ref 0–20)

## 2024-03-05 LAB — COMPREHENSIVE METABOLIC PANEL WITH GFR
ALT: 7 U/L (ref 0–44)
AST: 14 U/L — ABNORMAL LOW (ref 15–41)
Albumin: 4 g/dL (ref 3.5–5.0)
Alkaline Phosphatase: 134 U/L — ABNORMAL HIGH (ref 38–126)
Anion gap: 14 (ref 5–15)
BUN: 29 mg/dL — ABNORMAL HIGH (ref 8–23)
CO2: 22 mmol/L (ref 22–32)
Calcium: 8.8 mg/dL — ABNORMAL LOW (ref 8.9–10.3)
Chloride: 104 mmol/L (ref 98–111)
Creatinine, Ser: 1.21 mg/dL (ref 0.61–1.24)
GFR, Estimated: 59 mL/min — ABNORMAL LOW (ref >60)
Glucose, Bld: 125 mg/dL — ABNORMAL HIGH (ref 70–99)
Potassium: 3.6 mmol/L (ref 3.5–5.1)
Sodium: 140 mmol/L (ref 135–145)
Total Bilirubin: 0.8 mg/dL (ref 0.0–1.2)
Total Protein: 7.1 g/dL (ref 6.5–8.1)

## 2024-03-05 LAB — CBC WITH DIFFERENTIAL/PLATELET
Abs Immature Granulocytes: 0.02 K/uL (ref 0.00–0.07)
Basophils Absolute: 0 K/uL (ref 0.0–0.1)
Basophils Relative: 0 %
Eosinophils Absolute: 0.3 K/uL (ref 0.0–0.5)
Eosinophils Relative: 6 %
HCT: 37.4 % — ABNORMAL LOW (ref 39.0–52.0)
Hemoglobin: 12.4 g/dL — ABNORMAL LOW (ref 13.0–17.0)
Immature Granulocytes: 0 %
Lymphocytes Relative: 30 %
Lymphs Abs: 1.5 K/uL (ref 0.7–4.0)
MCH: 29.7 pg (ref 26.0–34.0)
MCHC: 33.2 g/dL (ref 30.0–36.0)
MCV: 89.7 fL (ref 80.0–100.0)
Monocytes Absolute: 0.4 K/uL (ref 0.1–1.0)
Monocytes Relative: 8 %
Neutro Abs: 2.9 K/uL (ref 1.7–7.7)
Neutrophils Relative %: 56 %
Platelets: 161 K/uL (ref 150–400)
RBC: 4.17 MIL/uL — ABNORMAL LOW (ref 4.22–5.81)
RDW: 13.2 % (ref 11.5–15.5)
WBC: 5.2 K/uL (ref 4.0–10.5)
nRBC: 0 % (ref 0.0–0.2)

## 2024-03-05 LAB — PROTIME-INR
INR: 0.9 (ref 0.8–1.2)
Prothrombin Time: 13.2 s (ref 11.4–15.2)

## 2024-03-05 LAB — C-REACTIVE PROTEIN: CRP: 0.8 mg/dL (ref <1.0)

## 2024-03-05 LAB — MAGNESIUM: Magnesium: 2.5 mg/dL — ABNORMAL HIGH (ref 1.7–2.4)

## 2024-03-05 MED ORDER — GADOBUTROL 1 MMOL/ML IV SOLN
8.0000 mL | Freq: Once | INTRAVENOUS | Status: AC | PRN
  Administered 2024-03-05: 8 mL via INTRAVENOUS

## 2024-03-05 MED ORDER — CEPHALEXIN 500 MG PO CAPS: 500.0000 mg | ORAL_CAPSULE | Freq: Four times a day (QID) | ORAL | 0 refills | Status: AC

## 2024-03-05 MED ORDER — LIDOCAINE HCL (PF) 1 % IJ SOLN
2.1000 mL | Freq: Once | INTRAMUSCULAR | Status: AC
  Administered 2024-03-05: 2.1 mL
  Filled 2024-03-05: qty 5

## 2024-03-05 MED ORDER — DOXYCYCLINE HYCLATE 100 MG PO TABS: 100.0000 mg | ORAL_TABLET | Freq: Once | ORAL | Status: DC

## 2024-03-05 MED ORDER — CEFTRIAXONE SODIUM 1 G IJ SOLR
1.0000 g | Freq: Once | INTRAMUSCULAR | Status: AC
  Administered 2024-03-05: 1 g via INTRAMUSCULAR
  Filled 2024-03-05: qty 10

## 2024-03-05 MED ORDER — CEPHALEXIN 500 MG PO CAPS: 500.0000 mg | ORAL_CAPSULE | Freq: Four times a day (QID) | ORAL | 0 refills | Status: DC

## 2024-03-05 NOTE — Telephone Encounter (Signed)
 FYI Only or Action Required?: FYI only for provider: ED advised.  Patient was last seen in primary care on 09/08/2023 by Severa Rock HERO, FNP.  Called Nurse Triage reporting Foot Swelling.  Symptoms began several days ago.  Interventions attempted: Rest, hydration, or home remedies.  Symptoms are: gradually worsening.  Triage Disposition: Go to ED Now (or PCP Triage)  Patient/caregiver understands and will follow disposition?: Yes   Copied from CRM [id number]. Topic: Clinical - Red Word Triage >> Mar 05, 2024  9:58 AM Graeme ORN wrote: Red Word that prompted transfer to Nurse Triage: Foot swollen, red, oozing - Possible cellulitis - previously had to have some on foot removed. Reason for Disposition  [1] Red area or streak AND [2] fever  Answer Assessment - Initial Assessment Questions Pt's daughter calling to report pt has worsening redness, swelling, drainage from right foot that appears to be moving up into ankle. Pt's daughter notes history of cellulitis and need for IV abx as treatment. She feels this may be what he needs, advised ED is best option for IV abx treatment and admission if needed. Pt's daughter agrees, states she will discuss with pt and call 911, this RN offered to call for her and she declines stating she will call after he is aware of plan.    1. ONSET: When did the swelling start? (e.g., minutes, hours, days)     Several Days 2. LOCATION: What part of the leg is swollen?  Are both legs swollen or just one leg?     Right foot, moving up ankle 3. SEVERITY: How bad is the swelling? (e.g., localized; mild, moderate, severe)     Deferred 4. REDNESS: Is there redness or signs of infection?     Redness, discharge 5. PAIN: Is the swelling painful to touch? If Yes, ask: How painful is it?   (Scale 1-10; mild, moderate or severe)     Deferred 6. FEVER: Do you have a fever? If Yes, ask: What is it, how was it measured, and when did it start?       Deferred 7. CAUSE: What do you think is causing the leg swelling?     Unknown, pt's daughter believes cellulitis 8. MEDICAL HISTORY: Do you have a history of blood clots (e.g., DVT), cancer, heart failure, kidney disease, or liver failure?     DM 9. RECURRENT SYMPTOM: Have you had leg swelling before? If Yes, ask: When was the last time? What happened that time?     History of cellulitis 10. OTHER SYMPTOMS: Do you have any other symptoms? (e.g., chest pain, difficulty breathing)       Deferred  Protocols used: Leg Swelling and Edema-A-AH

## 2024-03-05 NOTE — ED Provider Notes (Signed)
 [place] EMERGENCY DEPARTMENT AT [facility] Provider Note   CSN: [id number] Arrival date & time: 03/05/24  1230     Patient presents with: Foot Pain   Wayne [name] is a 83 y.o. male.    Foot Pain  Patient presents for skin changes to left foot.  Medical history includes DM, PAD, neuropathy, CAD, CHF, CKD, HLD, HTN, dementia, anemia.  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  1 month ago, he underwent surgical repair of his right hip.  Over the past several days, he has skin changes to dorsum of left foot.  Patient denies any associated pain.  He will have swelling that is increased with dependent positioning.  He has not had any systemic symptoms.     Prior to Admission medications   Medication Sig Start Date End Date Taking? Authorizing Provider  Accu-Chek Softclix Lancets lancets Use 4 times daily as directed to check blood sugars. 02/22/24   [name], NP  acetaminophen  (TYLENOL ) 325 MG tablet Take 2 tablets (650 mg total) by mouth every 6 (six) hours as needed for mild pain (pain score 1-3), fever or headache (or Fever >/= 101). 02/04/24   Johnson, Clanford L, MD  amLODipine  (NORVASC ) 5 MG tablet Take 1 tablet (5 mg total) by mouth daily. 02/22/24   [name], NP  aspirin  EC 81 MG tablet Take 1 tablet (81 mg total) by mouth 2 (two) times daily. Swallow whole. Patient not taking: Reported on 02/27/2024 02/04/24   [name], MD  cephALEXin  (KEFLEX ) 500 MG capsule Take 1 capsule (500 mg total) by mouth 4 (four) times daily for 7 days. 03/05/24 03/12/24  Melvenia Motto, MD  dextromethorphan  (DELSYM ) 30 MG/5ML liquid Take 5 mLs (30 mg total) by mouth 2 (two) times daily as needed for cough. 02/04/24   Johnson, Clanford L, MD  furosemide  (LASIX ) 40 MG tablet Take 1 tablet (40 mg total) by mouth daily. **NEEDS TO BE SEEN BEFORE NEXT REFILL** 02/22/24   [name], NP  insulin   aspart (NOVOLOG  FLEXPEN) 100 UNIT/ML FlexPen INJECT 5 UNITS INTO THE SKIN 3 (THREE) TIMES DAILY WITH MEALS. 02/27/24   [name], NP  insulin  glargine (LANTUS ) 100 UNIT/ML injection Inject 0.1 mLs (10 Units total) into the skin at bedtime. 02/22/24   [name], NP  Insulin  Pen Needle 30G X 5 MM MISC 1 Application by Does not apply route 4 (four) times daily. 02/22/24   [name], NP  ipratropium-albuterol  (DUONEB) 0.5-2.5 (3) MG/3ML SOLN Take 3 mLs by nebulization 3 (three) times daily. 02/22/24   [name], NP  pantoprazole  (PROTONIX ) 40 MG tablet Take 1 tablet (40 mg total) by mouth every evening. 02/22/24   [name], NP  polyethylene glycol (MIRALAX  / GLYCOLAX ) 17 g packet Take 17 g by mouth daily. 02/04/24   [name], MD    Allergies: Patient has no known allergies.    Review of Systems  Skin:  Positive for color change.  All other systems reviewed and are negative.   Updated Vital Signs BP (!) 189/79   Pulse 80   Temp 97.9 F (36.6 C) (Oral)   Resp 16   Ht 6' (1.829 m)   Wt 78 kg   SpO2 96%   BMI 23.32 kg/m   Physical Exam Vitals and nursing note reviewed.  Constitutional:      General: He  is not in acute distress.    Appearance: Normal appearance. He is well-developed. He is not ill-appearing, toxic-appearing or diaphoretic.  HENT:     Head: Normocephalic and atraumatic.     Right Ear: External ear normal.     Left Ear: External ear normal.     Nose: Nose normal.     Mouth/Throat:     Mouth: Mucous membranes are moist.  Eyes:     Extraocular Movements: Extraocular movements intact.     Conjunctiva/sclera: Conjunctivae normal.  Cardiovascular:     Rate and Rhythm: Normal rate and regular rhythm.  Pulmonary:     Effort: Pulmonary effort is normal. No respiratory distress.  Abdominal:     General: There is no distension.     Palpations: Abdomen is soft.     Tenderness: There is no abdominal tenderness.  Musculoskeletal:         General: Swelling present.     Cervical back: Normal range of motion and neck supple.  Skin:    General: Skin is warm and dry.     Findings: Erythema present.  Neurological:     General: No focal deficit present.     Mental Status: He is alert and oriented to person, place, and time.  Psychiatric:        Mood and Affect: Mood normal.        Behavior: Behavior normal.     (all labs ordered are listed, but only abnormal results are displayed) Labs Reviewed  COMPREHENSIVE METABOLIC PANEL WITH GFR - Abnormal; Notable for the following components:      Result Value   Glucose, Bld 125 (*)    BUN 29 (*)    Calcium  8.8 (*)    AST 14 (*)    Alkaline Phosphatase 134 (*)    GFR, Estimated 59 (*)    All other components within normal limits  CBC WITH DIFFERENTIAL/PLATELET - Abnormal; Notable for the following components:   RBC 4.17 (*)    Hemoglobin 12.4 (*)    HCT 37.4 (*)    All other components within normal limits  MAGNESIUM - Abnormal; Notable for the following components:   Magnesium 2.5 (*)    All other components within normal limits  SEDIMENTATION RATE - Abnormal; Notable for the following components:   Sed Rate 41 (*)    All other components within normal limits  CULTURE, BLOOD (ROUTINE X 2)  CULTURE, BLOOD (ROUTINE X 2)  LACTIC ACID, PLASMA  PROTIME-INR  LACTIC ACID, PLASMA  C-REACTIVE PROTEIN    EKG: None  Radiology: MR FOOT LEFT W WO CONTRAST Result Date: 03/05/2024 CLINICAL DATA:  Soft tissue infection suspected, foot, xray done EXAM: MRI OF THE LEFT FOREFOOT WITHOUT AND WITH CONTRAST TECHNIQUE: Multiplanar, multisequence MR imaging of the left forefoot was performed both before and after administration of intravenous contrast. CONTRAST:  8mL GADAVIST  GADOBUTROL  1 MMOL/ML IV SOLN COMPARISON:  Radiographs 03/05/2024 FINDINGS: Technical note: Despite efforts by the technologist and patient, mild to moderate motion artifact is present on today's exam and could  not be eliminated. This reduces exam sensitivity and specificity. Motion is greatest on the postcontrast images. Bones/Joint/Cartilage In correlation with the earlier radiographs, there is no evidence of acute fracture, dislocation or cortical destruction. No T1 marrow signal abnormalities are identified. Questionable mild plantar subluxation at the 3rd proximal interphalangeal joint, best seen on the sagittal images. There is heterogeneous fat saturation in the small toe on the T2 and postcontrast images. No definite abnormal  signal identified on inversion recovery imaging. No significant joint effusions. Ligaments Intact Lisfranc ligament. The collateral ligaments of the metatarsophalangeal joints appear intact. Muscles and Tendons Mild nonspecific forefoot muscular edema. No evidence of tenosynovitis or suspicious enhancement. Soft tissues Moderate dorsal subcutaneous edema. No obvious skin ulceration, focal fluid collection or foreign body. IMPRESSION: 1. Moderate dorsal subcutaneous edema without focal fluid collection or foreign body. This could reflect cellulitis. Correlate clinically. 2. No evidence of osteomyelitis or septic joint. 3. Questionable mild plantar subluxation at the 3rd proximal interphalangeal joint. Electronically Signed   By: Elsie Perone M.D.   On: 03/05/2024 16:58   DG Foot Complete Left Result Date: 03/05/2024 EXAM: 3 OR MORE VIEW(S) XRAY OF THE LEFT FOOT 03/05/2024 01:37:36 PM COMPARISON: None available. CLINICAL HISTORY: concern of infection FINDINGS: BONES AND JOINTS: No acute fracture. No malalignment. Superior calcaneal spur. 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 SOFT TISSUES: Vascular calcifications. Soft tissue swelling of the forefoot. IMPRESSION: 1. Soft tissue swelling of the forefoot 2. Mild midfoot and first MTP osteoarthritis. 3. Superior calcaneal spur. Electronically signed by: Norleen Boxer MD 03/05/2024 02:26 PM EST RP Workstation: [id number]      Procedures   Medications Ordered in the ED  cefTRIAXone  (ROCEPHIN ) injection 1 g (has no administration in time range)  gadobutrol  (GADAVIST ) 1 MMOL/ML injection 8 mL (8 mLs Intravenous Contrast Given 03/05/24 1632)                                    Medical Decision Making Amount and/or Complexity of Data Reviewed Labs: ordered. Radiology: ordered.  Risk Prescription drug management.   Patient presenting for skin changes to dorsum of left foot.  Vital signs on arrival are notable for hypertension.  On exam, patient is overall well-appearing.  Skin changes do appear to be petechial in nature.  There is a mild swelling to the dorsum of his left forefoot.  There is no associated tenderness.  There is minimal warmth.  Workup was initiated.  Patient's lab work showed baseline anemia, no leukocytosis, normal kidney function, normal electrolytes, age-adjusted normal ESR.  Patient underwent x-ray and MRI of left foot.  Imaging studies showed no osseous involvement.  There is mild subcutaneous edema without any focal fluid collections.  Patient to be treated empirically for cellulitis.  Ceftriaxone  ordered in the ED.  Patient was prescribed ongoing Keflex .  Patient was advised to elevate leg when possible and to return if area seems to be worsening on antibiotics at home.  Patient is stable for discharge at this time.     Final diagnoses:  Cellulitis of left lower extremity    ED Discharge Orders          Ordered    cephALEXin  (KEFLEX ) 500 MG capsule  4 times daily,   Status:  Discontinued        03/05/24 1719    cephALEXin  (KEFLEX ) 500 MG capsule  4 times daily        03/05/24 1720               Melvenia Motto, MD 03/05/24 1720

## 2024-03-05 NOTE — Telephone Encounter (Unsigned)
 Copied from CRM [id number]. Topic: Clinical - Home Health Verbal Orders >> Mar 05, 2024 12:54 PM Wess RAMAN wrote: Caller/Agency: Don/ [facility] Callback Number: [phone] Service Requested: Occupational Therapy Frequency: 1 week 1; 2 week 1 Any new concerns about the patient? No  Plan of Care: POC  Mobility safety, health promotion, exercise, pain control, ulcer prevention, and heart failure education

## 2024-03-05 NOTE — ED Notes (Signed)
 Patient transported to MRI

## 2024-03-05 NOTE — Telephone Encounter (Signed)
Fyi noted.

## 2024-03-05 NOTE — ED Triage Notes (Signed)
 Pt bib [facility] for left foot cellulitis

## 2024-03-05 NOTE — Discharge Instructions (Addendum)
 You were given your first dose of antibiotics in the emergency department.  This will cover you until tomorrow.  A prescription for ongoing antibiotics was sent to your pharmacy.  Take as prescribed.  Elevate your leg when possible.  If area seems to be worsening despite antibiotics, or if you experience any other new concerning symptoms, please return to the emergency department.

## 2024-03-06 ENCOUNTER — Ambulatory Visit

## 2024-03-06 NOTE — Telephone Encounter (Signed)
 Returned Don's call- [email or web address]

## 2024-03-06 NOTE — Telephone Encounter (Signed)
 VO for OT freq given

## 2024-03-10 LAB — CULTURE, BLOOD (ROUTINE X 2)
Culture: NO GROWTH
Culture: NO GROWTH
Special Requests: ADEQUATE
Special Requests: ADEQUATE

## 2024-03-12 ENCOUNTER — Ambulatory Visit: Admitting: Podiatry

## 2024-03-15 ENCOUNTER — Encounter: Payer: Self-pay | Admitting: Family Medicine

## 2024-03-15 ENCOUNTER — Ambulatory Visit: Admitting: Family Medicine

## 2024-03-16 ENCOUNTER — Ambulatory Visit (INDEPENDENT_AMBULATORY_CARE_PROVIDER_SITE_OTHER): Admitting: Family Medicine

## 2024-03-16 ENCOUNTER — Encounter: Payer: Self-pay | Admitting: Family Medicine

## 2024-03-16 VITALS — BP 150/60 | HR 67 | Temp 97.3°F | Ht 73.0 in

## 2024-03-16 DIAGNOSIS — I129 Hypertensive chronic kidney disease with stage 1 through stage 4 chronic kidney disease, or unspecified chronic kidney disease: Secondary | ICD-10-CM

## 2024-03-16 DIAGNOSIS — Z7984 Long term (current) use of oral hypoglycemic drugs: Secondary | ICD-10-CM | POA: Diagnosis not present

## 2024-03-16 DIAGNOSIS — D638 Anemia in other chronic diseases classified elsewhere: Secondary | ICD-10-CM

## 2024-03-16 DIAGNOSIS — N183 Chronic kidney disease, stage 3 unspecified: Secondary | ICD-10-CM

## 2024-03-16 DIAGNOSIS — E1151 Type 2 diabetes mellitus with diabetic peripheral angiopathy without gangrene: Secondary | ICD-10-CM | POA: Diagnosis not present

## 2024-03-16 DIAGNOSIS — L03116 Cellulitis of left lower limb: Secondary | ICD-10-CM | POA: Diagnosis not present

## 2024-03-16 DIAGNOSIS — D631 Anemia in chronic kidney disease: Secondary | ICD-10-CM

## 2024-03-16 DIAGNOSIS — E1122 Type 2 diabetes mellitus with diabetic chronic kidney disease: Secondary | ICD-10-CM | POA: Diagnosis not present

## 2024-03-16 DIAGNOSIS — N1831 Chronic kidney disease, stage 3a: Secondary | ICD-10-CM

## 2024-03-16 DIAGNOSIS — F01C0 Vascular dementia, severe, without behavioral disturbance, psychotic disturbance, mood disturbance, and anxiety: Secondary | ICD-10-CM

## 2024-03-16 MED ORDER — SULFAMETHOXAZOLE-TRIMETHOPRIM 800-160 MG PO TABS: 1.0000 | ORAL_TABLET | Freq: Two times a day (BID) | ORAL | 0 refills | Status: AC

## 2024-03-16 NOTE — Progress Notes (Addendum)
 "    Subjective:  Patient ID: Wayne [name], male    DOB: [birth year], 83 y.o.   MRN: [id number]  Patient Care Team: Severa Rock HERO, FNP as PCP - General (Family Medicine) Santo Stanly LABOR, MD as PCP - Cardiology (Cardiology) Gershon Donnice SAUNDERS, DPM as Consulting Physician (Podiatry) Jerilynn Lamarr HERO, NP as Nurse Practitioner (Cardiology) Patrcia Sharper, MD as Consulting Physician (Ophthalmology)   Chief Complaint:  ER follow up (03/05/2024 (5 hours)/[place] [facility]- cellulitis left lower leg ) and rehab follow up (Closed fracture of right hip with routine healing- [facility]/)   HPI: Wayne [name] is a 83 y.o. male presenting on 03/16/2024 for ER follow up (03/05/2024 (5 hours)/[place] [facility] at [facility]- cellulitis left lower leg ) and rehab follow up (Closed fracture of right hip with routine healing- [facility]/)   Wayne [name] is an 83 year old male with diabetes and hypertension who presents for follow-up after recent hospitalization and rehabilitation.  He was recently discharged from a rehabilitation facility following a hospital stay. During his time in rehab, he developed a sore on his left foot, which led to an emergency room visit approximately 10-11 days ago. He completed a course of Keflex  for this, and the foot has shown significant improvement, though there is still some redness present.  His blood pressure was noted to be high during his rehab stay, and Norvasc  (amlodipine ) was added to his medication regimen. At home, his blood pressure averages around 150/60 mmHg. No headaches, chest pain, or leg swelling.  His diabetes management was altered in rehab, where he was placed on insulin . Upon returning home, he resumed glipizide , which has resulted in better blood sugar control. His last A1c was 7.1%.  He  underwent a hip replacement. He has one more follow-up visit scheduled with his orthopedic surgeon.  Recent lab work showed slightly elevated glucose, low calcium , and low platelets. He is slightly anemic with hemoglobin levels of 12.4 and 13.7. No dark, tarry stools, weakness, or shortness of breath. His diet is noted to be poor, which may contribute to his anemia.  He has a history of peripheral artery disease.          Relevant past medical, surgical, family, and social history reviewed and updated as indicated.  Allergies and medications reviewed and updated. Data reviewed: Chart in Epic.   Past Medical History:  Diagnosis Date   AKI (acute kidney injury) 05/21/2022   Anemia of chronic disease    Chronic HFrEF (heart failure with reduced ejection fraction) (HCC)    Chronic kidney disease, stage 3a (HCC)    Coronary artery disease    Dr. Tisa years ago. No details.   Diabetes mellitus with circulatory complication (HCC)    Diabetic foot infection (HCC)    Encephalopathy    Former tobacco use    Hypertension    Lactic acidosis 11/28/2021   Multifocal pneumonia 11/28/2021   PAD (peripheral artery disease)    Peripheral neuropathy    SDH (subdural hematoma) (HCC)    Severe pulmonary hypertension (HCC)    Subdural hematoma (HCC) 11/28/2021    Past Surgical History:  Procedure Laterality Date   ABDOMINAL AORTOGRAM W/LOWER EXTREMITY Right 11/13/2021   Procedure: ABDOMINAL AORTOGRAM W/LOWER EXTREMITY;  Surgeon: Magda Debby SAILOR, MD;  Location: [facility];  Service: Cardiovascular;  Laterality: Right;   AMPUTATION Right  05/23/2022   Procedure: AMPUTATION RAY;  Surgeon: Malvin Marsa FALCON, DPM;  Location: [facility] OR;  Service: Podiatry;  Laterality: Right;  Partial 5th ray amputation   AMPUTATION Right 08/26/2022   Procedure: MIDFOOT AMPUTATION RIGHT FOOT, BONE BIOPSY RIGHT FOOT, TENDO ACHILIES LENGTHENING;  Surgeon: Janit Thresa HERO, DPM;  Location: [facility] OR;  Service:  Podiatry;  Laterality: Right;   APPLICATION OF WOUND VAC Right 08/26/2022   Procedure: APPLICATION OF WOUND VAC;  Surgeon: Janit Thresa HERO, DPM;  Location: [facility] OR;  Service: Podiatry;  Laterality: Right;   CORONARY ANGIOPLASTY WITH STENT PLACEMENT     HEMIARTHROPLASTY (BIPOLAR), HIP, DIRECT ANTERIOR APPROACH, FOR FRACTURE Right 02/02/2024   Procedure: HEMIARTHROPLASTY (BIPOLAR), HIP, DIRECT ANTERIOR APPROACH, FOR FRACTURE;  Surgeon: Onesimo Oneil LABOR, MD;  Location: [facility];  Service: Orthopedics;  Laterality: Right;   IR FLUORO GUIDE CV LINE RIGHT  08/27/2022   IR REMOVAL TUN CV CATH W/O FL  10/08/2022   IR US  GUIDE VASC ACCESS RIGHT  08/27/2022   IRRIGATION AND DEBRIDEMENT FOOT Right 05/25/2022   Procedure: IRRIGATION AND DEBRIDEMENT FOOT;  Surgeon: Gershon Donnice SAUNDERS, DPM;  Location: [facility];  Service: Podiatry;  Laterality: Right;    Social History   Socioeconomic History   Marital status: Divorced    Spouse name: Not on file   Number of children: Not on file   Years of education: Not on file   Highest education level: Not on file  Occupational History   Not on file  Tobacco Use   Smoking status: Former    Current packs/day: 0.00    Types: Cigarettes    Quit date: 14    Years since quitting: 33.0   Smokeless tobacco: Never  Vaping Use   Vaping status: Never Used  Substance and Sexual Activity   Alcohol use: No   Drug use: No   Sexual activity: Not Currently  Other Topics Concern   Not on file  Social History Narrative   Not on file   Social Drivers of Health   Tobacco Use: Medium Risk (03/20/2024)   Patient History    Smoking Tobacco Use: Former    Smokeless Tobacco Use: Never    Passive Exposure: Not on file  Financial Resource Strain: Low Risk (05/11/2023)   Overall Financial Resource Strain (CARDIA)    Difficulty of Paying Living Expenses: Not hard at all  Food Insecurity: No Food Insecurity (01/31/2024)   Epic    Worried About Programme Researcher, Broadcasting/film/video in the Last Year:  Never true    Ran Out of Food in the Last Year: Never true  Transportation Needs: No Transportation Needs (01/31/2024)   Epic    Lack of Transportation (Medical): No    Lack of Transportation (Non-Medical): No  Physical Activity: Inactive (05/11/2023)   Exercise Vital Sign    Days of Exercise per Week: 0 days    Minutes of Exercise per Session: 0 min  Stress: No Stress Concern Present (05/11/2023)   [employer] of Occupational Health - Occupational Stress Questionnaire    Feeling of Stress : Not at all  Social Connections: Socially Isolated (01/31/2024)   Social Connection and Isolation Panel    Frequency of Communication with Friends and Family: More than three times a week    Frequency of Social Gatherings with Friends and Family: More than three times a week    Attends Religious Services: Never    Database Administrator or Organizations: No    Attends Club or  Organization Meetings: Never    Marital Status: Divorced  Catering Manager Violence: Not At Risk (01/31/2024)   Epic    Fear of Current or Ex-Partner: No    Emotionally Abused: No    Physically Abused: No    Sexually Abused: No  Depression (PHQ2-9): Low Risk (03/16/2024)   Depression (PHQ2-9)    PHQ-2 Score: 0  Alcohol Screen: Low Risk (05/11/2023)   Alcohol Screen    Last Alcohol Screening Score (AUDIT): 0  Housing: Low Risk (01/31/2024)   Epic    Unable to Pay for Housing in the Last Year: No    Number of Times Moved in the Last Year: 0    Homeless in the Last Year: No  Utilities: Not At Risk (01/31/2024)   Epic    Threatened with loss of utilities: No  Health Literacy: Adequate Health Literacy (05/11/2023)   B1300 Health Literacy    Frequency of need for help with medical instructions: Never    Outpatient Encounter Medications as of 03/16/2024  Medication Sig   Accu-Chek Softclix Lancets lancets Use 4 times daily as directed to check blood sugars.   acetaminophen  (TYLENOL ) 325 MG tablet Take 2 tablets (650 mg  total) by mouth every 6 (six) hours as needed for mild pain (pain score 1-3), fever or headache (or Fever >/= 101).   amLODipine  (NORVASC ) 5 MG tablet Take 1 tablet (5 mg total) by mouth daily.   furosemide  (LASIX ) 40 MG tablet Take 1 tablet (40 mg total) by mouth daily. **NEEDS TO BE SEEN BEFORE NEXT REFILL**   sulfamethoxazole -trimethoprim  (BACTRIM  DS) 800-160 MG tablet Take 1 tablet by mouth 2 (two) times daily for 7 days.   [DISCONTINUED] glipiZIDE  (GLUCOTROL ) 5 MG tablet Take 5 mg by mouth daily before breakfast.   aspirin  EC 81 MG tablet Take 1 tablet (81 mg total) by mouth 2 (two) times daily. Swallow whole. (Patient not taking: Reported on 03/16/2024)   [DISCONTINUED] dextromethorphan  (DELSYM ) 30 MG/5ML liquid Take 5 mLs (30 mg total) by mouth 2 (two) times daily as needed for cough.   [DISCONTINUED] insulin  aspart (NOVOLOG  FLEXPEN) 100 UNIT/ML FlexPen INJECT 5 UNITS INTO THE SKIN 3 (THREE) TIMES DAILY WITH MEALS.   [DISCONTINUED] insulin  glargine (LANTUS ) 100 UNIT/ML injection Inject 0.1 mLs (10 Units total) into the skin at bedtime.   [DISCONTINUED] Insulin  Pen Needle 30G X 5 MM MISC 1 Application by Does not apply route 4 (four) times daily.   [DISCONTINUED] ipratropium-albuterol  (DUONEB) 0.5-2.5 (3) MG/3ML SOLN Take 3 mLs by nebulization 3 (three) times daily.   [DISCONTINUED] pantoprazole  (PROTONIX ) 40 MG tablet Take 1 tablet (40 mg total) by mouth every evening.   [DISCONTINUED] polyethylene glycol (MIRALAX  / GLYCOLAX ) 17 g packet Take 17 g by mouth daily.   No facility-administered encounter medications on file as of 03/16/2024.    Allergies[1]  Pertinent ROS per HPI, otherwise unremarkable      Objective:  BP (!) 150/60 Comment: home reading  Pulse 67   Temp (!) 97.3 F (36.3 C)   Ht 6' 1 (1.854 m)   SpO2 97%   BMI 22.69 kg/m    Wt Readings from Last 3 Encounters:  03/05/24 171 lb 15.3 oz (78 kg)  02/22/24 173 lb (78.5 kg)  02/07/24 173 lb (78.5 kg)    Physical  Exam Vitals and nursing note reviewed.  Constitutional:      General: He is not in acute distress.    Appearance: He is ill-appearing (chronically ill). He is not  toxic-appearing or diaphoretic.  HENT:     Right Ear: Decreased hearing noted.     Left Ear: Decreased hearing noted.     Mouth/Throat:     Mouth: Mucous membranes are moist.  Eyes:     Pupils: Pupils are equal, round, and reactive to light.  Cardiovascular:     Rate and Rhythm: Normal rate and regular rhythm.     Heart sounds: Normal heart sounds.  Pulmonary:     Effort: Pulmonary effort is normal.     Breath sounds: Normal breath sounds.  Musculoskeletal:     Cervical back: Neck supple.     Right lower leg: No edema.     Left lower leg: No edema.       Feet:  Feet:     Comments: Minimal redness to foot as dictated above. Small, healing wound to second toe. Scabbed over, no drainage.  Skin:    General: Skin is warm and dry.     Capillary Refill: Capillary refill takes less than 2 seconds.  Neurological:     Mental Status: He is alert. Mental status is at baseline.     Gait: Gait abnormal (using walker).  Psychiatric:        Mood and Affect: Mood normal.        Behavior: Behavior normal.        Thought Content: Thought content normal.        Judgment: Judgment normal.       Results for orders placed or performed in visit on 03/16/24  Anemia Profile B   Collection Time: 03/16/24 10:34 AM  Result Value Ref Range   Total Iron Binding Capacity 250 250 - 450 ug/dL   UIBC 820 888 - 656 ug/dL   Iron 71 38 - 830 ug/dL   Iron Saturation 28 15 - 55 %   Ferritin 182 30 - 400 ng/mL   Vitamin B-12 318 232 - 1,245 pg/mL   Folate 9.1 >3.0 ng/mL   WBC 5.7 3.4 - 10.8 x10E3/uL   RBC 4.17 4.14 - 5.80 x10E6/uL   Hemoglobin 12.3 (L) 13.0 - 17.7 g/dL   Hematocrit 62.6 (L) 62.4 - 51.0 %   MCV 89 79 - 97 fL   MCH 29.5 26.6 - 33.0 pg   MCHC 33.0 31.5 - 35.7 g/dL   RDW 86.6 88.3 - 84.5 %   Platelets 184 150 - 450 x10E3/uL    Neutrophils 47 Not Estab. %   Lymphs 37 Not Estab. %   Monocytes 9 Not Estab. %   Eos 6 Not Estab. %   Basos 1 Not Estab. %   Neutrophils Absolute 2.7 1.4 - 7.0 x10E3/uL   Lymphocytes Absolute 2.1 0.7 - 3.1 x10E3/uL   Monocytes Absolute 0.5 0.1 - 0.9 x10E3/uL   EOS (ABSOLUTE) 0.3 0.0 - 0.4 x10E3/uL   Basophils Absolute 0.0 0.0 - 0.2 x10E3/uL   Immature Granulocytes 0 Not Estab. %   Immature Grans (Abs) 0.0 0.0 - 0.1 x10E3/uL   Retic Ct Pct 1.8 0.6 - 2.6 %  CMP14+EGFR   Collection Time: 03/16/24 10:34 AM  Result Value Ref Range   Glucose 113 (H) 70 - 99 mg/dL   BUN 16 8 - 27 mg/dL   Creatinine, Ser 8.62 (H) 0.76 - 1.27 mg/dL   eGFR 51 (L) >40 fO/fpw/8.26   BUN/Creatinine Ratio 12 10 - 24   Sodium 142 134 - 144 mmol/L   Potassium 4.0 3.5 - 5.2 mmol/L   Chloride 104 96 -  106 mmol/L   CO2 22 20 - 29 mmol/L   Calcium  9.1 8.6 - 10.2 mg/dL   Total Protein 6.5 6.0 - 8.5 g/dL   Albumin 4.1 3.7 - 4.7 g/dL   Globulin, Total 2.4 1.5 - 4.5 g/dL   Bilirubin Total 0.7 0.0 - 1.2 mg/dL   Alkaline Phosphatase 127 48 - 129 IU/L   AST 12 0 - 40 IU/L   ALT 7 0 - 44 IU/L       Pertinent labs & imaging results that were available during my care of the patient were reviewed by me and considered in my medical decision making.  Assessment & Plan:  Wayne [name] was seen today for er follow up and rehab follow up.  Diagnoses and all orders for this visit:  Cellulitis of left foot -     sulfamethoxazole -trimethoprim  (BACTRIM  DS) 800-160 MG tablet; Take 1 tablet by mouth 2 (two) times daily for 7 days. 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 Significant improvement with Keflex , but residual redness persists. - Prescribed Bactrim  twice daily with food for 7 days - Ordered blood work to recheck blood counts, liver and kidney function, glucose, and calcium  levels  Type 2 diabetes mellitus with peripheral vascular disease and stage 3 chronic kidney disease Type 2 diabetes mellitus managed with glipizide  after transitioning from insulin . Blood sugars improved. Peripheral vascular disease and stage 3 chronic kidney disease present. No current symptoms of chest pain, headaches, or leg swelling. Blood pressure well-controlled with amlodipine . - Continue glipizide  for diabetes management - Continue amlodipine  for blood pressure control - Start baby aspirin  to manage peripheral artery disease and reduce risk of complications - Plan for A1c test in March  Anemia in stage 3a chronic kidney disease Mild anemia with hemoglobin levels slightly below normal range. No symptoms of pallor, dyspnea, or weakness. No evidence of gastrointestinal bleeding. - Ordered CBC to monitor hemoglobin and platelet levels - Ordered CMP to assess kidney function and calcium  levels - Ordered anemia profile to evaluate vitamin B, iron, and ferritin levels          Continue all other maintenance medications.  Follow up plan: Return in about 3 months (around 06/14/2024), or if symptoms worsen or fail to improve, for DM, HTN.   Continue healthy lifestyle choices, including diet (rich in fruits, vegetables, and lean proteins, and low in salt and simple carbohydrates) and exercise (at least 30 minutes of moderate physical activity daily).  Educational handout  given for cellulitis   The above assessment and management plan was discussed with the patient. The patient verbalized understanding of and has agreed to  the management plan. Patient is aware to call the clinic if they develop any new symptoms or if symptoms persist or worsen. Patient is aware when to return to the clinic for a follow-up visit. Patient educated on when it is appropriate to go to the emergency department.   Rosaline Bruns, FNP-C [facility] [phone]     [1] No Known Allergies  "

## 2024-03-17 LAB — ANEMIA PROFILE B
Basophils Absolute: 0 x10E3/uL (ref 0.0–0.2)
Basos: 1 %
EOS (ABSOLUTE): 0.3 x10E3/uL (ref 0.0–0.4)
Eos: 6 %
Ferritin: 182 ng/mL (ref 30–400)
Folate: 9.1 ng/mL
Hematocrit: 37.3 % — ABNORMAL LOW (ref 37.5–51.0)
Hemoglobin: 12.3 g/dL — ABNORMAL LOW (ref 13.0–17.7)
Immature Grans (Abs): 0 x10E3/uL (ref 0.0–0.1)
Immature Granulocytes: 0 %
Iron Saturation: 28 % (ref 15–55)
Iron: 71 ug/dL (ref 38–169)
Lymphocytes Absolute: 2.1 x10E3/uL (ref 0.7–3.1)
Lymphs: 37 %
MCH: 29.5 pg (ref 26.6–33.0)
MCHC: 33 g/dL (ref 31.5–35.7)
MCV: 89 fL (ref 79–97)
Monocytes Absolute: 0.5 x10E3/uL (ref 0.1–0.9)
Monocytes: 9 %
Neutrophils Absolute: 2.7 x10E3/uL (ref 1.4–7.0)
Neutrophils: 47 %
Platelets: 184 x10E3/uL (ref 150–450)
RBC: 4.17 x10E6/uL (ref 4.14–5.80)
RDW: 13.3 % (ref 11.6–15.4)
Retic Ct Pct: 1.8 % (ref 0.6–2.6)
Total Iron Binding Capacity: 250 ug/dL (ref 250–450)
UIBC: 179 ug/dL (ref 111–343)
Vitamin B-12: 318 pg/mL (ref 232–1245)
WBC: 5.7 x10E3/uL (ref 3.4–10.8)

## 2024-03-17 LAB — CMP14+EGFR
ALT: 7 IU/L (ref 0–44)
AST: 12 IU/L (ref 0–40)
Albumin: 4.1 g/dL (ref 3.7–4.7)
Alkaline Phosphatase: 127 IU/L (ref 48–129)
BUN/Creatinine Ratio: 12 (ref 10–24)
BUN: 16 mg/dL (ref 8–27)
Bilirubin Total: 0.7 mg/dL (ref 0.0–1.2)
CO2: 22 mmol/L (ref 20–29)
Calcium: 9.1 mg/dL (ref 8.6–10.2)
Chloride: 104 mmol/L (ref 96–106)
Creatinine, Ser: 1.37 mg/dL — ABNORMAL HIGH (ref 0.76–1.27)
Globulin, Total: 2.4 g/dL (ref 1.5–4.5)
Glucose: 113 mg/dL — ABNORMAL HIGH (ref 70–99)
Potassium: 4 mmol/L (ref 3.5–5.2)
Sodium: 142 mmol/L (ref 134–144)
Total Protein: 6.5 g/dL (ref 6.0–8.5)
eGFR: 51 mL/min/1.73 — ABNORMAL LOW

## 2024-03-18 ENCOUNTER — Ambulatory Visit: Payer: Self-pay | Admitting: Family Medicine

## 2024-03-18 ENCOUNTER — Other Ambulatory Visit: Payer: Self-pay | Admitting: Family Medicine

## 2024-03-18 DIAGNOSIS — E1151 Type 2 diabetes mellitus with diabetic peripheral angiopathy without gangrene: Secondary | ICD-10-CM

## 2024-03-20 ENCOUNTER — Other Ambulatory Visit (INDEPENDENT_AMBULATORY_CARE_PROVIDER_SITE_OTHER): Payer: Self-pay

## 2024-03-20 ENCOUNTER — Ambulatory Visit: Admitting: Orthopedic Surgery

## 2024-03-20 ENCOUNTER — Other Ambulatory Visit: Payer: Self-pay | Admitting: Adult Health

## 2024-03-20 ENCOUNTER — Encounter: Payer: Self-pay | Admitting: Orthopedic Surgery

## 2024-03-20 DIAGNOSIS — S72001D Fracture of unspecified part of neck of right femur, subsequent encounter for closed fracture with routine healing: Secondary | ICD-10-CM

## 2024-03-20 NOTE — Progress Notes (Signed)
 Orthopaedic Postop Note  Assessment: Wayne [name] is a 83 y.o. male s/p Right hip hemiarthroplasty anterior approach  DOS: 02/02/2024  Plan: Wayne [name] is improving appropriately.  He is ambulating well with a walker.  He denies pain.  Nursing aide in the clinic today denies any issues.  Radiographs are stable.  Urged him to continue working with therapy.  Use a walker to assist with ambulation.  I would like to see him back in 6 weeks.   Follow-up: Return in about 6 weeks (around 05/01/2024). XR at next visit: AP pelvis and Right hip  Subjective:  Chief Complaint  Patient presents with   Post-op Follow-up    Right hip    History of Present Illness: Wayne [name] is a 83 y.o. male who presents following the above stated procedure.  He is doing well.  No pain.  His nurse aide states that there are no issues.  He continues to work with therapy.  He is using a walker to assist with ambulation.   Review of Systems: No fevers or chills No numbness or tingling No Chest Pain No shortness of breath   Objective: There were no vitals taken for this visit.  Physical Exam:  Pleasant.  Ambulates with the assistance of a walker  Anterior based hip incision has healed.  No surrounding erythema or drainage.  No tenderness surrounding the incision.  He is able to maintain a straight leg raise.  Denies pain with axial loading.  He tolerates gentle range of motion of the right hip.  Toes warm and well-perfused.  IMAGING: I personally ordered and reviewed the following images:  X-rays of the right hip were obtained in clinic today.  No acute injuries are noted.  No change in overall position of a right hip hemiarthroplasty.  There is been no subsidence.  No acute injuries.  No lucency around the implant.  Impression: Stable right hip hemiarthroplasty, without subsidence.  Wayne DELENA Horde, MD 03/20/2024 10:18 AM

## 2024-03-31 ENCOUNTER — Other Ambulatory Visit: Payer: Self-pay | Admitting: Adult Health

## 2024-04-25 ENCOUNTER — Other Ambulatory Visit: Payer: Self-pay | Admitting: Adult Health

## 2024-04-25 DIAGNOSIS — E1122 Type 2 diabetes mellitus with diabetic chronic kidney disease: Secondary | ICD-10-CM

## 2024-04-25 DIAGNOSIS — I5043 Acute on chronic combined systolic (congestive) and diastolic (congestive) heart failure: Secondary | ICD-10-CM

## 2024-05-01 ENCOUNTER — Ambulatory Visit: Admitting: Orthopedic Surgery

## 2024-06-15 ENCOUNTER — Ambulatory Visit: Admitting: Family Medicine
